# Patient Record
Sex: Female | Born: 1955 | Race: White | Hispanic: No
Health system: Southern US, Academic
[De-identification: ages and names within clinical notes are randomized; demographics above are authoritative.]

## PROBLEM LIST (undated history)

## (undated) ENCOUNTER — Ambulatory Visit

## (undated) ENCOUNTER — Encounter

## (undated) ENCOUNTER — Encounter: Attending: Internal Medicine | Primary: Internal Medicine

## (undated) ENCOUNTER — Ambulatory Visit: Payer: PRIVATE HEALTH INSURANCE

## (undated) DIAGNOSIS — E039 Hypothyroidism, unspecified: Secondary | ICD-10-CM

## (undated) DIAGNOSIS — N182 Chronic kidney disease, stage 2 (mild): Secondary | ICD-10-CM

## (undated) DIAGNOSIS — E119 Type 2 diabetes mellitus without complications: Secondary | ICD-10-CM

## (undated) DIAGNOSIS — I10 Essential (primary) hypertension: Secondary | ICD-10-CM

## (undated) DIAGNOSIS — K219 Gastro-esophageal reflux disease without esophagitis: Secondary | ICD-10-CM

## (undated) DIAGNOSIS — M069 Rheumatoid arthritis, unspecified: Secondary | ICD-10-CM

## (undated) DIAGNOSIS — E785 Hyperlipidemia, unspecified: Secondary | ICD-10-CM

## (undated) DIAGNOSIS — Z8639 Personal history of other endocrine, nutritional and metabolic disease: Secondary | ICD-10-CM

## (undated) DIAGNOSIS — G629 Polyneuropathy, unspecified: Secondary | ICD-10-CM

## (undated) DIAGNOSIS — F32A Depression, unspecified: Secondary | ICD-10-CM

## (undated) DIAGNOSIS — E559 Vitamin D deficiency, unspecified: Secondary | ICD-10-CM

## (undated) DIAGNOSIS — E538 Deficiency of other specified B group vitamins: Secondary | ICD-10-CM

## (undated) DIAGNOSIS — J449 Chronic obstructive pulmonary disease, unspecified: Secondary | ICD-10-CM

## (undated) DIAGNOSIS — F329 Major depressive disorder, single episode, unspecified: Secondary | ICD-10-CM

## (undated) HISTORY — DX: Polyneuropathy, unspecified: G62.9

## (undated) HISTORY — DX: Vitamin D deficiency, unspecified: E55.9

## (undated) HISTORY — DX: Hyperlipidemia, unspecified: E78.5

## (undated) HISTORY — DX: Chronic obstructive pulmonary disease, unspecified: J44.9

## (undated) HISTORY — DX: Type 2 diabetes mellitus without complications: E11.9

## (undated) HISTORY — PX: CATARACT EXTRACTION: SUR2

## (undated) HISTORY — DX: Deficiency of other specified B group vitamins: E53.8

## (undated) HISTORY — DX: Depression, unspecified: F32.A

## (undated) HISTORY — DX: Chronic kidney disease, stage 2 (mild): N18.2

## (undated) HISTORY — DX: Rheumatoid arthritis, unspecified: M06.9

## (undated) HISTORY — DX: Hypothyroidism, unspecified: E03.9

## (undated) HISTORY — DX: Gastro-esophageal reflux disease without esophagitis: K21.9

## (undated) HISTORY — DX: Personal history of other endocrine, nutritional and metabolic disease: Z86.39

## (undated) HISTORY — DX: Essential (primary) hypertension: I10

## (undated) HISTORY — DX: Major depressive disorder, single episode, unspecified: F32.9

---

## 1998-08-25 ENCOUNTER — Other Ambulatory Visit: Admission: RE | Admit: 1998-08-25 | Discharge: 1998-08-25 | Payer: Self-pay

## 1998-09-24 ENCOUNTER — Ambulatory Visit (HOSPITAL_BASED_OUTPATIENT_CLINIC_OR_DEPARTMENT_OTHER): Admission: RE | Admit: 1998-09-24 | Discharge: 1998-09-24 | Payer: Self-pay | Admitting: Orthopedic Surgery

## 2001-02-06 ENCOUNTER — Other Ambulatory Visit: Admission: RE | Admit: 2001-02-06 | Discharge: 2001-02-06 | Payer: Self-pay

## 2001-11-05 ENCOUNTER — Encounter: Payer: Self-pay | Admitting: Family Medicine

## 2001-11-05 ENCOUNTER — Ambulatory Visit (HOSPITAL_COMMUNITY): Admission: RE | Admit: 2001-11-05 | Discharge: 2001-11-05 | Payer: Self-pay | Admitting: Family Medicine

## 2002-12-03 ENCOUNTER — Ambulatory Visit (HOSPITAL_COMMUNITY): Admission: RE | Admit: 2002-12-03 | Discharge: 2002-12-03 | Payer: Self-pay | Admitting: Family Medicine

## 2005-02-18 ENCOUNTER — Ambulatory Visit: Payer: Self-pay | Admitting: Family Medicine

## 2005-08-23 ENCOUNTER — Other Ambulatory Visit: Admission: RE | Admit: 2005-08-23 | Discharge: 2005-08-23 | Payer: Self-pay | Admitting: Family Medicine

## 2006-12-12 ENCOUNTER — Other Ambulatory Visit: Admission: RE | Admit: 2006-12-12 | Discharge: 2006-12-12 | Payer: Self-pay | Admitting: Family Medicine

## 2008-06-25 ENCOUNTER — Ambulatory Visit (HOSPITAL_COMMUNITY): Admission: RE | Admit: 2008-06-25 | Discharge: 2008-06-25 | Payer: Self-pay | Admitting: Internal Medicine

## 2008-08-14 ENCOUNTER — Ambulatory Visit (HOSPITAL_COMMUNITY): Admission: RE | Admit: 2008-08-14 | Discharge: 2008-08-14 | Payer: Self-pay | Admitting: Family Medicine

## 2009-09-22 ENCOUNTER — Ambulatory Visit (HOSPITAL_COMMUNITY): Admission: RE | Admit: 2009-09-22 | Discharge: 2009-09-22 | Payer: Self-pay | Admitting: Internal Medicine

## 2009-09-22 IMAGING — MG MM DIGITAL SCREENING
5 series · 5 of 5 positions shown · non-contrast
Comparison: Prior studies.

DG SCREEN MAMMOGRAM BILATERAL
Bilateral CC and MLO view(s) were taken.

DIGITAL SCREENING MAMMOGRAM WITH CAD:

[L CC (1 of 2)]
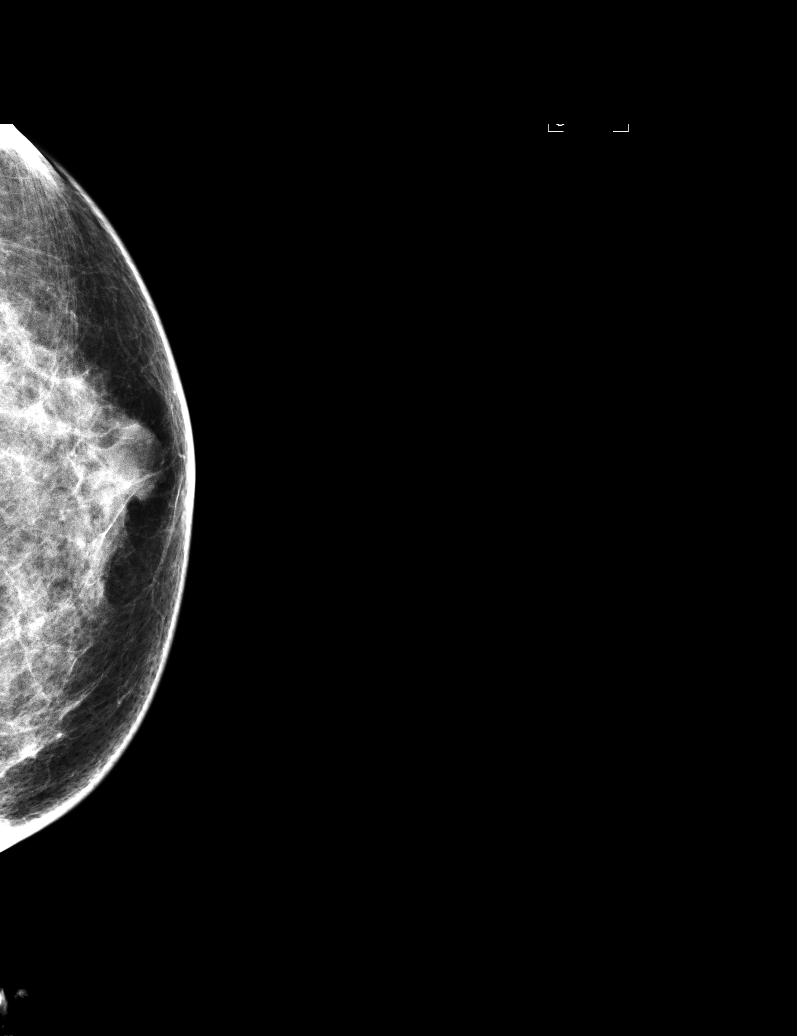

[L MLO]
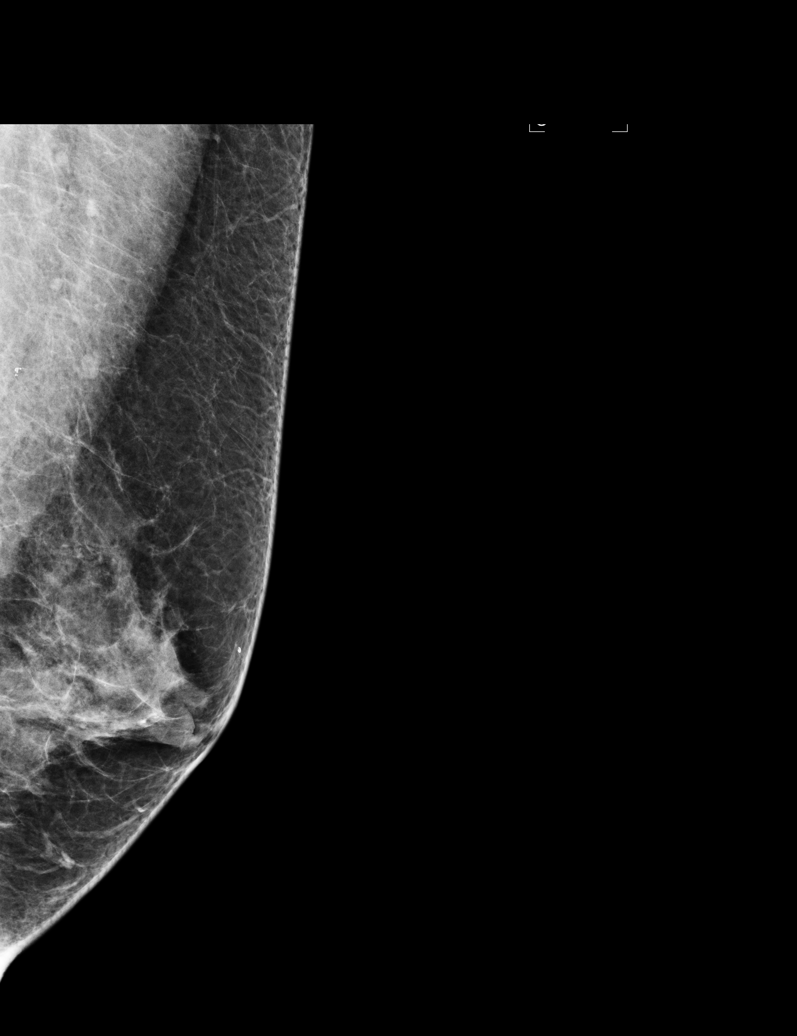

[R CC]
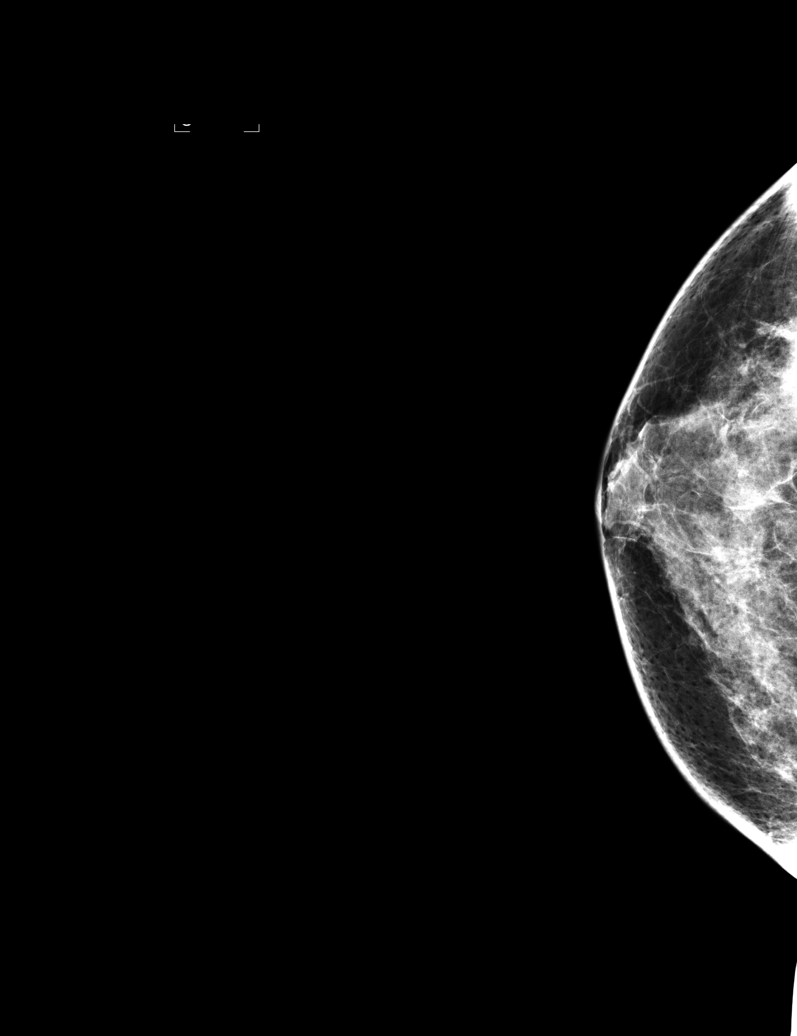

[R MLO]
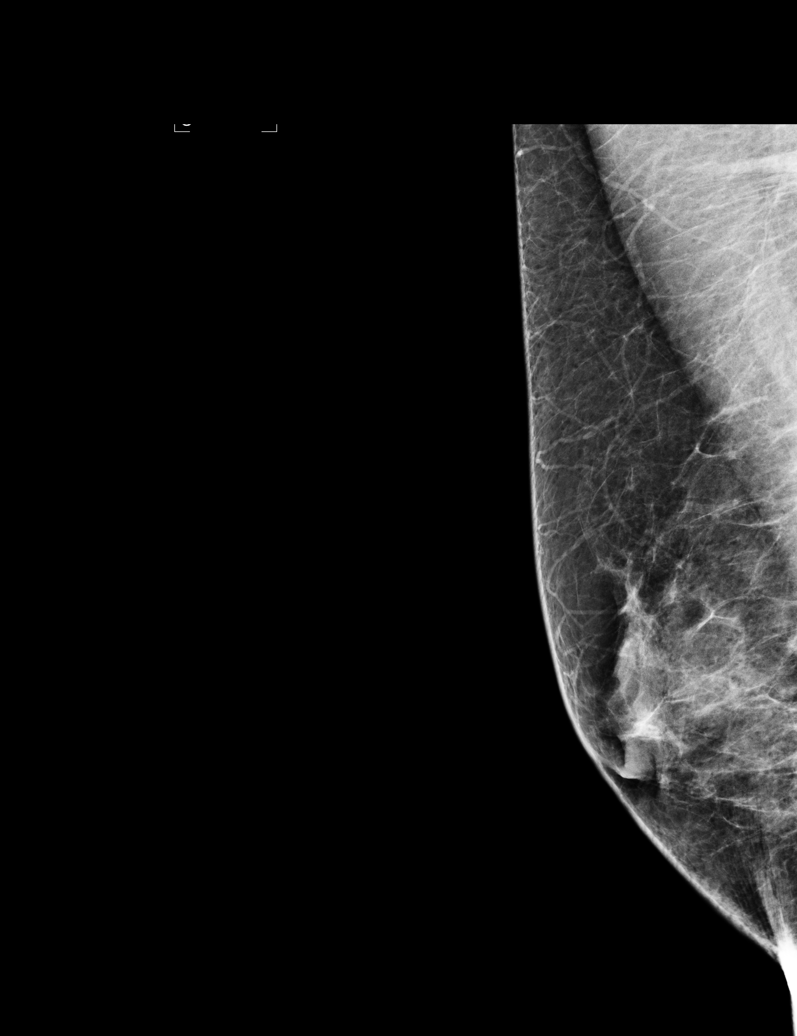

[L CC (2 of 2)]
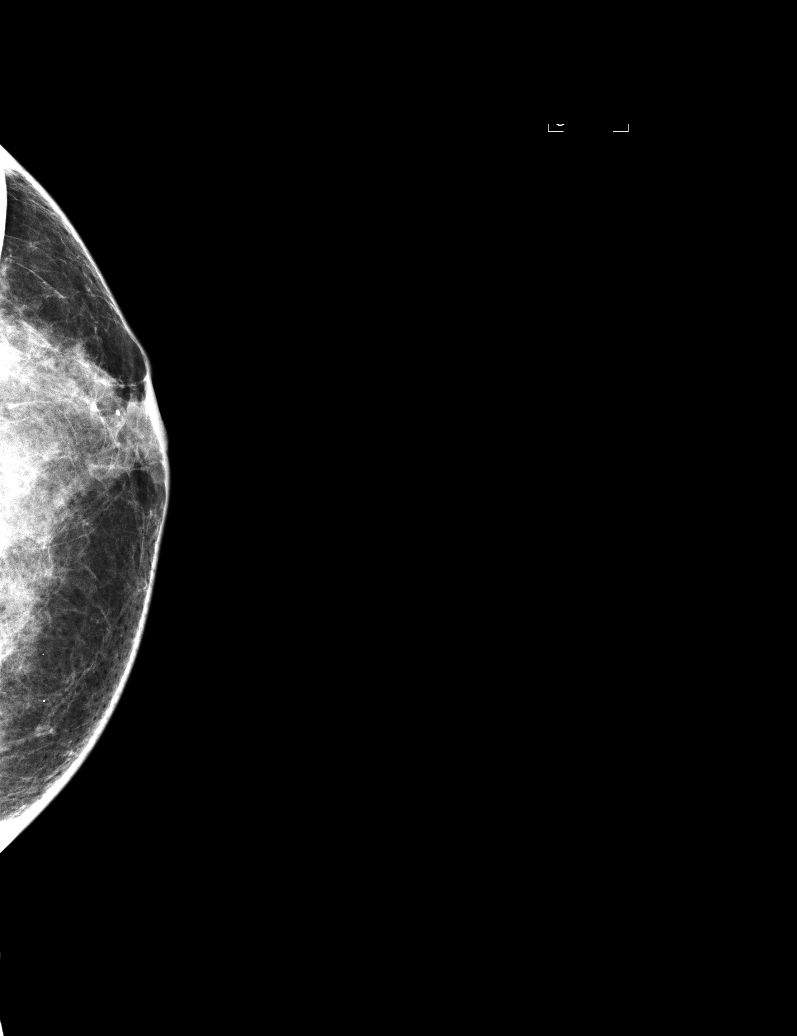

[5 of 5 positions shown; findings below may reference images not displayed]

The breast tissue is heterogeneously dense.  There is no dominant mass, architectural distortion or
calcification to suggest malignancy.

Images were processed with CAD.
IMPRESSION: No mammographic evidence of malignancy.  Suggest yearly screening mammography.

A result letter of this screening mammogram will be mailed directly to the patient.

ASSESSMENT: Negative - BI-RADS 1

Screening mammogram in 1 year.
,

## 2010-09-26 HISTORY — PX: COLONOSCOPY: SHX174

## 2010-10-17 ENCOUNTER — Encounter: Payer: Self-pay | Admitting: Family Medicine

## 2015-07-27 ENCOUNTER — Encounter (HOSPITAL_COMMUNITY): Payer: Self-pay | Admitting: *Deleted

## 2015-07-27 ENCOUNTER — Encounter (HOSPITAL_COMMUNITY)
Admission: RE | Disposition: A | Discharge status: Home / Self Care | Payer: Self-pay | Source: Ambulatory Visit | Attending: Ophthalmology

## 2015-07-27 ENCOUNTER — Ambulatory Visit (HOSPITAL_COMMUNITY)
Admission: RE | Admit: 2015-07-27 | Discharge: 2015-07-27 | Disposition: A | Discharge status: Home / Self Care | Payer: PRIVATE HEALTH INSURANCE | Source: Ambulatory Visit | Attending: Ophthalmology | Admitting: Ophthalmology

## 2015-07-27 DIAGNOSIS — H26492 Other secondary cataract, left eye: Secondary | ICD-10-CM | POA: Insufficient documentation

## 2015-07-27 HISTORY — PX: YAG LASER APPLICATION: SHX6189

## 2015-07-27 SURGERY — TREATMENT, USING YAG LASER
Anesthesia: LOCAL | Laterality: Left

## 2015-07-27 MED ORDER — TROPICAMIDE 1 % OP SOLN
OPHTHALMIC | Status: AC
  Filled 2015-07-27: qty 3

## 2015-07-27 MED ORDER — TETRACAINE HCL 0.5 % OP SOLN
1.0000 [drp] | Freq: Once | OPHTHALMIC | Status: AC
  Administered 2015-07-27: 1 [drp] via OPHTHALMIC

## 2015-07-27 MED ORDER — TROPICAMIDE 1 % OP SOLN
1.0000 [drp] | OPHTHALMIC | Status: AC
  Administered 2015-07-27 (×3): 1 [drp] via OPHTHALMIC

## 2015-07-27 MED ORDER — TETRACAINE HCL 0.5 % OP SOLN
OPHTHALMIC | Status: AC
  Filled 2015-07-27: qty 2

## 2015-07-27 NOTE — Brief Op Note (Signed)
CURRY DULSKI 07/27/2015  Williams Che, MD  Pre-op Diagnosis:  secondary cataract left eye  Post-op Diagnosis: same  Yag laser self-test completed: Yes.    Indications:  See office H&P for specific indications  Procedure:   YAG posterior capsulotomy OS  Eye protection worn by staff:  Yes.   Laser In Use sign on door:  Yes.    Laser:   {LUMENIS SELECTA DUET YAG/SLT LASER  Power Setting:  1.7 mJ/burst Anatomical site treated:  Posterior capsule OS Number of applications:  32 Total energy delivered: 53.70 mJ Results:  Visual axis cleared.  Patient was instructed to go to the office, as previously scheduled, for intraocular pressure:  No.  Patient verbalizes understanding of discharge instructions:  Yes.    Notes:   Pt tolerated procedure well.  There were no complications.

## 2015-07-27 NOTE — Discharge Instructions (Signed)
RILLA BUCKMAN  07/27/2015     Instructions    Activity: No Restrictions.   Diet: Resume Diet you were on at home.   Pain Medication: Tylenol if Needed.   CONTACT YOUR DOCTOR IF YOU HAVE PAIN, REDNESS IN YOUR EYE, OR DECREASED VISION.   Follow-up: 08/18/2015 at 1:30  with Williams Che, MD.     Dr. Iona Hansen: [phone]     If you find that you cannot contact your physician, but feel that your signs and   Symptoms warrant a physician's attention, call the Emergency Room at   [phone] ext.[phone].

## 2015-07-27 NOTE — H&P (Signed)
I have reviewed the pre printed H&P, the patient was re-examined, and I have identified no significant interval changes in the patient's medical condition.  There is no change in the plan of care since the history and physical of record. 

## 2017-04-10 MED FILL — XELJANZ XR/11MG/TB24: XELJANZ XR/11MG/TB24 | 30 days supply | Qty: 30 | Fill #2

## 2017-05-08 NOTE — Unmapped (Signed)
Specialty Pharmacy Refill Coordination Note     Kristin Mccormick is a 61 y.o. female contacted today regarding refills of her specialty medication(s).    Reviewed and verified with patient:      Specialty medication(s) and dose(s) confirmed: yes  Changes to medications: no  Changes to insurance: no    Medication Adherence    Patient reported X missed doses in the last month:  0  Specialty Medication:  [name] 11mg   Informant:  patient  Reliability of informant:  reliable  Provider-estimated medication adherence level:  90-100%  Confirmed plan for next specialty medication refill:  delivery by pharmacy  Medication Assistance Program  Refill Coordination  Has the Patient's Contact Information Changed:  No  Is the Shipping Address Different:  No  Shipping Information  Delivery Scheduled:  Yes  Delivery Date:  05/10/17  Medications to be Shipped:  [name] 11mg           Follow-up: 1 month(s)     [employer]  Specialty Pharmacy Technician

## 2017-05-09 MED FILL — XELJANZ XR/11MG/TB24: XELJANZ XR/11MG/TB24 | 30 days supply | Qty: 30 | Fill #3

## 2017-06-06 MED ORDER — TOFACITINIB ER 11 MG TABLET,EXTENDED RELEASE 24 HR
ORAL_TABLET | ORAL | 2 refills | 0 days
Start: 2017-06-06 — End: 2018-06-06

## 2017-06-06 NOTE — Unmapped (Signed)
[facility] Specialty Pharmacy Refill Coordination Note  Specialty Medication(s): [name]  Additional Medications shipped:     Kristin Mccormick, DOB: 11/15/1955  Phone: [phone] (home) , Alternate phone contact: N/A  Phone or address changes today?: No  All above HIPAA information was verified with patient.  Shipping Address: [phone]  [place] [address]   Insurance changes? No    Completed refill call assessment today to schedule patient's medication shipment from the [facility] [phone]).      Confirmed the medication and dosage are correct and have not changed: Yes, regimen is correct and unchanged.    Confirmed patient started or stopped the following medications in the past month:  No, there are no changes reported at this time.    Are you tolerating your medication?:  Kristin Mccormick reports tolerating the medication.    ADHERENCE    (Below is required for Medicare Part B or Transplant patients only - per drug):   How many tablets were dispensed last month:   Patient currently has  remaining.    Did you miss any doses in the past 4 weeks? No missed doses reported.    FINANCIAL/SHIPPING    Delivery Scheduled: Yes, Expected medication delivery date: 9/17     Kristin Mccormick did not have any additional questions at this time.    Delivery address validated in FSI scheduling system: Yes, address listed in FSI is correct.    We will follow up with patient monthly for standard refill processing and delivery.      Thank you,  Darlin Coco   [facility] Specialty Technician

## 2017-06-08 MED ORDER — TOFACITINIB ER 11 MG TABLET,EXTENDED RELEASE 24 HR
ORAL_TABLET | ORAL | 2 refills | 0 days
Start: 2017-06-08 — End: 2018-06-08

## 2017-06-12 MED FILL — XELJANZ XR/11MG/TB24: XELJANZ XR/11MG/TB24 | 30 days supply | Qty: 30 | Fill #0

## 2017-07-05 NOTE — Unmapped (Signed)
PT SAYS SHE IS WAITING BECAUSE DOC WANTS TO CHANGE THE DOSE - SHE SAID TO CALL HER WHEN WE GET THE SCRIPT

## 2017-07-12 NOTE — Unmapped (Signed)
[facility] Specialty Pharmacy Refill Coordination Note  Specialty Medication(s): Kristin Mccormick  Additional Medications shipped:     Kristin Mccormick, DOB: 1956/05/12  Phone: [phone] (home) , Alternate phone contact: N/A  Phone or address changes today?: No  All above HIPAA information was verified with patient.  Shipping Address: [phone]  [place] [address]   Insurance changes? No    Completed refill call assessment today to schedule patient's medication shipment from the [facility] [phone]).      Confirmed the medication and dosage are correct and have not changed: Yes, regimen is correct and unchanged.    Confirmed patient started or stopped the following medications in the past month:  No, there are no changes reported at this time.    Are you tolerating your medication?:  Kristin Mccormick reports tolerating the medication.    ADHERENCE      Did you miss any doses in the past 4 weeks? No missed doses reported.    FINANCIAL/SHIPPING    Delivery Scheduled: Yes, Expected medication delivery date: 07/14/17     Kristin Mccormick did not have any additional questions at this time.    Delivery address validated in FSI scheduling system: Yes, address listed in FSI is correct.    We will follow up with patient monthly for standard refill processing and delivery.      Thank you,  Kristin Mccormick   [facility] Specialty Pharmacist

## 2017-07-13 MED FILL — XELJANZ XR/11MG/TB24: XELJANZ XR/11MG/TB24 | 30 days supply | Qty: 30 | Fill #1

## 2017-09-07 MED FILL — XELJANZ XR/11MG/TB24: XELJANZ XR/11MG/TB24 | 30 days supply | Qty: 30 | Fill #2

## 2017-10-11 MED ORDER — TOFACITINIB ER 11 MG TABLET,EXTENDED RELEASE 24 HR
ORAL_TABLET | ORAL | 1 refills | 0 days
Start: 2017-10-11 — End: 2018-10-11

## 2017-10-11 MED FILL — XELJANZ XR/11MG/TB24: XELJANZ XR/11MG/TB24 | 30 days supply | Qty: 30 | Fill #0

## 2017-10-11 NOTE — Unmapped (Signed)
[facility] Specialty Pharmacy Refill Coordination Note  Specialty Medication(s): [name] 11mg     Kristin Mccormick, DOB: [birth year]  Phone: [phone] (home) , Alternate phone contact: N/A  Phone or address changes today?: No  All above HIPAA information was verified with patient.  Shipping Address: [phone]  [place] [address]   Insurance changes? No    Completed refill call assessment today to schedule patient's medication shipment from the [facility] [phone]).      Confirmed the medication and dosage are correct and have not changed: Yes, regimen is correct and unchanged.    Confirmed patient started or stopped the following medications in the past month:  No, there are no changes reported at this time.    Are you tolerating your medication?:  Kristin Mccormick reports tolerating the medication.    ADHERENCE    (Below is required for Medicare Part B or Transplant patients only - per drug):   How many tablets were dispensed last month: 30    Patient currently has 1 remaining.    Did you miss any doses in the past 4 weeks? No missed doses reported.    FINANCIAL/SHIPPING    Delivery Scheduled: Yes, Expected medication delivery date: 10/12/2017     [name] did not have any additional questions at this time.    Delivery address validated in FSI scheduling system: Yes, address listed in FSI is correct.    We will follow up with patient monthly for standard refill processing and delivery.      Thank you,  Tamala Fothergill   [facility] Shared [facility] Specialty Technician

## 2017-11-02 NOTE — Unmapped (Signed)
[facility] Specialty Pharmacy Refill and Clinical Coordination Note  Medication(s): Kristin Mccormick, DOB: October 16, 1955  Phone: [phone] (home) , Alternate phone contact: N/A  Shipping address: [address]  [place] [address]  Phone or address changes today?: No  All above HIPAA information verified.  Insurance changes? No    Completed refill and clinical call assessment today to schedule patient's medication shipment from the [facility] [phone]).      MEDICATION RECONCILIATION    Confirmed the medication and dosage are correct and have not changed: Yes, regimen is correct and unchanged.    Were there any changes to your medication(s) in the past month:  No, there are no changes reported at this time.    ADHERENCE    Is this medicine transplant or covered by Medicare Part B? No.        Did you miss any doses in the past 4 weeks? No missed doses reported.  Adherence counseling provided? Not needed     SIDE EFFECT MANAGEMENT    Are you tolerating your medication?:  Kristin Mccormick reports tolerating the medication.  Side effect management discussed: None      Therapy is appropriate and should be continued.          FINANCIAL/SHIPPING    Delivery Scheduled: Yes, Expected medication delivery date: 11/07/17   Additional medications refilled: No additional medications/refills needed at this time.    Kristin Mccormick did not have any additional questions at this time.    Delivery address validated in FSI scheduling system: Yes, address listed above is correct.      We will follow up with patient monthly for standard refill processing and delivery.      Thank you,  Rollen Sox   [facility] Shared [facility] Specialty Pharmacist

## 2017-11-07 MED FILL — XELJANZ XR/11MG/TB24: XELJANZ XR/11MG/TB24 | 30 days supply | Qty: 30 | Fill #1

## 2017-12-04 NOTE — Unmapped (Signed)
[facility] Specialty Pharmacy Refill Coordination Note  Specialty Medication(s): [name] 11mg       Vassie Loll, DOB: 1955-10-25  Phone: [phone] (home) , Alternate phone contact: N/A  Phone or address changes today?: No  All above HIPAA information was verified with patient.  Shipping Address: [phone]  [place] [address]   Insurance changes? No    Completed refill call assessment today to schedule patient's medication shipment from the [facility] [phone]).      Confirmed the medication and dosage are correct and have not changed: Yes, regimen is correct and unchanged.    Confirmed patient started or stopped the following medications in the past month:  Ms. Nygard reports discontinuing Methotrexate, Folic Acid and Sulcrafate  She also reports startng Leflunomide 20mg .    Are you tolerating your medication?:  Schae reports tolerating the medication.    ADHERENCE  Did you miss any doses in the past 4 weeks? No missed doses reported.    FINANCIAL/SHIPPING    Delivery Scheduled: Yes, Expected medication delivery date: 12/06/2017     The patient will receive an FSI print out for each medication shipped and additional FDA Medication Guides as required.  Patient education from [place] or Robet Leu may also be included in the shipment    Jose did not have any additional questions at this time.    Delivery address validated in FSI scheduling system: Yes, address listed in FSI is correct.    We will follow up with patient monthly for standard refill processing and delivery.      Thank you,  Argenis Kumari  Anders Grant   [facility] Specialty Pharmacist

## 2017-12-05 MED FILL — XELJANZ XR/11MG/TB24: XELJANZ XR/11MG/TB24 | 30 days supply | Qty: 30 | Fill #2

## 2018-01-02 NOTE — Unmapped (Signed)
[facility] Specialty Pharmacy Refill Coordination Note  Specialty Medication(s): Kristin Mccormick, DOB: [birth year]  Phone: [phone] (home) , Alternate phone contact: N/A  Phone or address changes today?: No  All above HIPAA information was verified with patient.  Shipping Address: [phone]  [place] [address]   Insurance changes? No    Completed refill call assessment today to schedule patient's medication shipment from the [facility] [phone]).      Confirmed the medication and dosage are correct and have not changed: Yes, regimen is correct and unchanged.    Confirmed patient started or stopped the following medications in the past month:  No, there are no changes reported at this time.    Are you tolerating your medication?:  Kristin Mccormick reports tolerating the medication.    ADHERENCE    (Below is required for Medicare Part B or Transplant patients only - per drug):   How many tablets were dispensed last month: 30  Patient currently has  6 OR 7 remaining.    Did you miss any doses in the past 4 weeks? No missed doses reported.    FINANCIAL/SHIPPING    Delivery Scheduled: Yes, Expected medication delivery date: 01/05/18     The patient will receive an FSI print out for each medication shipped and additional FDA Medication Guides as required.  Patient education from [place] or Robet Leu may also be included in the shipment    Kristin Mccormick did not have any additional questions at this time.    Delivery address validated in FSI scheduling system: Yes, address listed in FSI is correct.    We will follow up with patient monthly for standard refill processing and delivery.      Thank you,  Westley Gambles   [facility] Shared [facility] Specialty Technician

## 2018-01-04 MED FILL — XELJANZ XR/11MG/TB24: XELJANZ XR/11MG/TB24 | 30 days supply | Qty: 30 | Fill #0

## 2018-02-05 NOTE — Unmapped (Signed)
[facility] Specialty Pharmacy Refill Coordination Note  Specialty Medication(s): [name] 11mg   Additional Medications shipped: none    Kristin Mccormick, DOB: September 08, 1956  Phone: [phone] (home) , Alternate phone contact: N/A  Phone or address changes today?: No  All above HIPAA information was verified with patient.  Shipping Address: [phone]  [place] [address]   Insurance changes? No    Completed refill call assessment today to schedule patient's medication shipment from the [facility] [phone]).      Confirmed the medication and dosage are correct and have not changed: Yes, regimen is correct and unchanged.    Confirmed patient started or stopped the following medications in the past month:  No, there are no changes reported at this time.    Are you tolerating your medication?:  Kristin Mccormick reports tolerating the medication.    ADHERENCE    Did you miss any doses in the past 4 weeks? No missed doses reported.    FINANCIAL/SHIPPING    Delivery Scheduled: Yes, Expected medication delivery date: 02/07/18     The patient will receive an FSI print out for each medication shipped and additional FDA Medication Guides as required.  Patient education from [place] or Robet Leu may also be included in the shipment    Kristin Mccormick did not have any additional questions at this time.    Delivery address validated in FSI scheduling system: Yes, address listed in FSI is correct.    We will follow up with patient monthly for standard refill processing and delivery.      Thank you,  Lupita Shutter   [facility] Specialty Pharmacist

## 2018-02-07 NOTE — Unmapped (Signed)
PA denied for Kristin Mccormick, awaiting appeal.

## 2018-02-20 MED FILL — XELJANZ XR/11MG/TB24: XELJANZ XR/11MG/TB24 | 30 days supply | Qty: 30 | Fill #1

## 2018-03-27 MED ORDER — TOFACITINIB ER 11 MG TABLET,EXTENDED RELEASE 24 HR
ORAL_TABLET | ORAL | 0 refills | 0 days
Start: 2018-03-27 — End: 2019-03-27

## 2018-03-27 MED FILL — XELJANZ XR/11MG/TB24: XELJANZ XR/11MG/TB24 | 30 days supply | Qty: 30 | Fill #0

## 2018-04-24 MED ORDER — TOFACITINIB ER 11 MG TABLET,EXTENDED RELEASE 24 HR
ORAL_TABLET | ORAL | 2 refills | 0 days
Start: 2018-04-24 — End: 2018-07-27

## 2018-04-26 MED FILL — XELJANZ XR/11MG/TB24: XELJANZ XR/11MG/TB24 | 30 days supply | Qty: 30 | Fill #0

## 2018-05-25 MED FILL — XELJANZ XR 11 MG TABLET,EXTENDED RELEASE: 30 days supply | Qty: 30 | Fill #0 | Status: AC

## 2018-05-25 MED FILL — XELJANZ XR 11 MG TABLET,EXTENDED RELEASE: ORAL | 30 days supply | Qty: 30 | Fill #0

## 2018-06-25 MED FILL — XELJANZ XR 11 MG TABLET,EXTENDED RELEASE: ORAL | 30 days supply | Qty: 30 | Fill #1

## 2018-06-25 MED FILL — XELJANZ XR 11 MG TABLET,EXTENDED RELEASE: 30 days supply | Qty: 30 | Fill #1 | Status: AC

## 2018-07-27 MED ORDER — TOFACITINIB ER 11 MG TABLET,EXTENDED RELEASE 24 HR
ORAL_TABLET | Freq: Every day | ORAL | 2 refills | 0.00000 days
Start: 2018-07-27 — End: 2018-10-30

## 2018-07-31 MED FILL — XELJANZ XR 11 MG TABLET,EXTENDED RELEASE: ORAL | 30 days supply | Qty: 30 | Fill #0

## 2018-07-31 MED FILL — XELJANZ XR 11 MG TABLET,EXTENDED RELEASE: 30 days supply | Qty: 30 | Fill #0 | Status: AC

## 2018-08-30 MED FILL — XELJANZ XR 11 MG TABLET,EXTENDED RELEASE: 30 days supply | Qty: 30 | Fill #1 | Status: AC

## 2018-08-30 MED FILL — XELJANZ XR 11 MG TABLET,EXTENDED RELEASE: ORAL | 30 days supply | Qty: 30 | Fill #1

## 2018-10-01 MED FILL — XELJANZ XR 11 MG TABLET,EXTENDED RELEASE: 30 days supply | Qty: 30 | Fill #2 | Status: AC

## 2018-10-01 MED FILL — XELJANZ XR 11 MG TABLET,EXTENDED RELEASE: ORAL | 30 days supply | Qty: 30 | Fill #2

## 2018-10-30 MED ORDER — XELJANZ XR 11 MG TABLET,EXTENDED RELEASE
ORAL_TABLET | 1 refills | 0 days
Start: 2018-10-30 — End: 2019-01-01

## 2018-11-01 MED FILL — XELJANZ XR 11 MG TABLET,EXTENDED RELEASE: 30 days supply | Qty: 30 | Fill #0 | Status: AC

## 2018-11-01 MED FILL — XELJANZ XR 11 MG TABLET,EXTENDED RELEASE: 30 days supply | Qty: 30 | Fill #0

## 2018-11-26 ENCOUNTER — Encounter: Payer: Self-pay | Admitting: Gastroenterology

## 2018-11-28 MED FILL — XELJANZ XR 11 MG TABLET,EXTENDED RELEASE: 30 days supply | Qty: 30 | Fill #1

## 2018-11-28 MED FILL — XELJANZ XR 11 MG TABLET,EXTENDED RELEASE: 30 days supply | Qty: 30 | Fill #1 | Status: AC

## 2019-01-01 MED ORDER — XELJANZ XR 11 MG TABLET,EXTENDED RELEASE
ORAL_TABLET | 0 refills | 0 days
Start: 2019-01-01 — End: 2019-02-06

## 2019-01-09 MED FILL — XELJANZ XR 11 MG TABLET,EXTENDED RELEASE: 30 days supply | Qty: 30 | Fill #0 | Status: AC

## 2019-01-09 MED FILL — XELJANZ XR 11 MG TABLET,EXTENDED RELEASE: 30 days supply | Qty: 30 | Fill #0

## 2019-01-29 ENCOUNTER — Ambulatory Visit (INDEPENDENT_AMBULATORY_CARE_PROVIDER_SITE_OTHER): Payer: Commercial Managed Care - PPO | Admitting: Gastroenterology

## 2019-01-29 ENCOUNTER — Other Ambulatory Visit: Payer: Self-pay

## 2019-01-29 ENCOUNTER — Encounter: Payer: Self-pay | Admitting: Gastroenterology

## 2019-01-29 DIAGNOSIS — K529 Noninfective gastroenteritis and colitis, unspecified: Secondary | ICD-10-CM | POA: Insufficient documentation

## 2019-01-29 NOTE — Progress Notes (Signed)
CC'D TO PCP °

## 2019-01-29 NOTE — Progress Notes (Signed)
Primary Care Physician:  Glenda Chroman, MD Primary GI:  Garfield Cornea, MD    Patient Location: Home  Provider Location: [facility] office  Reason for Visit: diarrhea  Persons present on the virtual encounter, with roles: Patient, myself (provider),Mindy Quincy Simmonds CMA (updated meds and allergies)  Total time (minutes) spent on medical discussion: 25 minutes  Due to COVID-19, visit was conducted using Doxy.me method.  Visit was requested by patient.  Virtual Visit via Doxy.me  I connected with Erica Beasley on 01/29/19 at  8:00 AM EDT by [email or web address] and verified that I am speaking with the correct person using two identifiers.   I discussed the limitations, risks, security and privacy concerns of performing an evaluation and management service by telephone/video and the availability of in person appointments. I also discussed with the patient that there may be a patient responsible charge related to this service. The patient expressed understanding and agreed to proceed.  Chief Complaint  Patient presents with  . Diarrhea    x December, 10-15 times a day, not watery, no blood in stool, no abd pain, no nausea, no vomiting    HPI:   Erica Beasley is a 63 y.o. female who presents for virtual visit regarding chronic diarrhea at the request of Nicanor Bake, FNP.   Patient states back in October she had some change in bowel habits.  She went from having her normal 1 bowel movement a day to having several bowel movements a day.  She notes that however she felt like it was related to metformin.  She had her medications adjusted and came off of metformin.  She did okay until September 05, 2018.  She ate fish with tartar sauce and became ill with vomiting and diarrhea.  It was shortly after that that she was diagnosed with C. difficile.  She reports initially she was given a course of Flagyl.  She had no improvement in her symptoms.  Subsequently has had 2 rounds of vancomycin for persisting  diarrhea.  Really felt like no improvement with vancomycin either.  She denies any repeat stool testing.  She reports fecal urgency with incontinence if she cannot get there on time.  Stool appears greasy/oily.  She has a lot of gas.  Having up to 10 stools a day.  Denies nocturnal bowel movements.  No abdominal pain, melena, rectal bleeding.  No weight loss.  Appetite somewhat poor but she does eat.  She consumes yogurt every day.  No problems with heartburn, dysphagia.  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  Reports the only new medication she is on is Lyrica.  Received a fax from her rheumatologist requesting [place] to get a CBC, CMP when we sent her to the lab for ourselves.  TCS 2012, Dr. Posey Pronto, couple of polyps maybe.   Menopause age 68   Current Outpatient Medications  Medication Sig Dispense Refill  . citalopram (CELEXA) 20 MG tablet Take 20 mg by mouth daily.    . Fluticasone Furoate-Vilanterol (BREO ELLIPTA IN) Inhale into the lungs as needed.    Marland Kitchen glipiZIDE (GLUCOTROL) 10 MG tablet Take 10 mg by mouth 2 (two) times daily before a meal.    . hydroxychloroquine (PLAQUENIL) 200 MG tablet Take 200 mg by mouth daily.    Marland Kitchen leflunomide (ARAVA) 20 MG tablet Take 20 mg by mouth daily.    Marland Kitchen levothyroxine (SYNTHROID, LEVOTHROID) 112 MCG tablet Take 112 mcg by mouth  daily before breakfast.    . potassium chloride (K-DUR) 10 MEQ tablet Take 10 mEq by mouth daily.    . pravastatin (PRAVACHOL) 40 MG tablet Take 40 mg by mouth daily.    . predniSONE (DELTASONE) 5 MG tablet Take 5 mg by mouth daily with breakfast.    . pregabalin (LYRICA) 75 MG capsule Take 75 mg by mouth daily.    . raloxifene (EVISTA) 60 MG tablet Take 60 mg by mouth daily.    . Tofacitinib Citrate ER (XELJANZ XR) 11 MG TB24 Take 1 tablet by mouth daily.     No current facility-administered medications for this visit.     Past Medical History:  Diagnosis Date  . COPD (chronic obstructive pulmonary  disease) ([place])   . Depression   . Diabetes ([place])   . Hyperlipidemia   . Hypothyroidism   . Neuropathy   . Rheumatoid arthritis [facility])     Past Surgical History:  Procedure Laterality Date  . YAG LASER APPLICATION Left 31/54/0086   Procedure: YAG LASER APPLICATION;  Surgeon: Williams Che, MD;  Location: [facility];  Service: Ophthalmology;  Laterality: Left;    Family History  Problem Relation Age of Onset  . Stroke Mother   . Heart disease Mother   . Diabetes Mother   . Other Father        stomach taken out for some reason  . Colon cancer Neg Hx     Social History   Socioeconomic History  . Marital status: Widowed    Spouse name: Not on file  . Number of children: Not on file  . Years of education: Not on file  . Highest education level: Not on file  Occupational History  . Not on file  Social Needs  . Financial resource strain: Not on file  . Food insecurity:    Worry: Not on file    Inability: Not on file  . Transportation needs:    Medical: Not on file    Non-medical: Not on file  Tobacco Use  . Smoking status: Former Smoker    Packs/day: 1.00    Years: 15.00    Pack years: 15.00    Types: Cigarettes  . Smokeless tobacco: Former Network engineer and Sexual Activity  . Alcohol use: Never    Frequency: Never  . Drug use: Never  . Sexual activity: Not on file  Lifestyle  . Physical activity:    Days per week: Not on file    Minutes per session: Not on file  . Stress: Not on file  Relationships  . Social connections:    Talks on phone: Not on file    Gets together: Not on file    Attends religious service: Not on file    Active member of club or organization: Not on file    Attends meetings of clubs or organizations: Not on file    Relationship status: Not on file  . Intimate partner violence:    Fear of current or ex partner: Not on file    Emotionally abused: Not on file    Physically abused: Not on file    Forced sexual activity: Not on file   Other Topics Concern  . Not on file  Social History Narrative  . Not on file      ROS:  General: Negative for anorexia, weight loss, fever, chills, fatigue, weakness. Eyes: Negative for vision changes.  ENT: Negative for hoarseness, difficulty swallowing , nasal congestion. CV: Negative  for chest pain, angina, palpitations, dyspnea on exertion, peripheral edema.  Respiratory: Negative for dyspnea at rest, dyspnea on exertion, cough, sputum, wheezing.  GI: See history of present illness. GU:  Negative for dysuria, hematuria, urinary incontinence, urinary frequency, nocturnal urination.  MS: Negative for joint pain, low back pain.  Derm: Negative for rash or itching.  Neuro: Negative for weakness, abnormal sensation, seizure, frequent headaches, memory loss, confusion.  Psych: Negative for anxiety, depression, suicidal ideation, hallucinations.  Endo: Negative for unusual weight change.  Heme: Negative for bruising or bleeding. Allergy: Negative for rash or hives.   Observations/Objective: Pleasant well-nourished well-developed Caucasian female in no acute distress.  Otherwise exam unavailable.  Assessment and Plan: Pleasant 63 year old female presenting for further evaluation of chronic diarrhea.  Symptoms began back in December.  She reports being diagnosed with C. difficile.  Initially given a course of Flagyl, subsequently 2 courses of vancomycin.  Patient has persistent diarrhea.  She is at increased risk for C. 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  Would be beneficial at this point to recheck her stools.  If her C. difficile is negative, would work towards colonoscopy to further evaluate her chronic diarrhea including rule out microscopic colitis.  We will go ahead and obtain labs per rheumatology request as well as for [place] to evaluate for other potential causes of chronic diarrhea such as celiac.  Follow Up Instructions:    I discussed the assessment and treatment plan with  the patient. The patient was provided an opportunity to ask questions and all were answered. The patient agreed with the plan and demonstrated an understanding of the instructions. AVS mailed to patient's home address.   The patient was advised to call back or seek an in-person evaluation if the symptoms worsen or if the condition fails to improve as anticipated.  I provided 25 minutes of virtual face-to-face time during this encounter.   Neil Crouch, PA-C

## 2019-01-29 NOTE — Patient Instructions (Addendum)
1. Please go to the [facility] in [place] to have your stool test and blood work done. [facility] actually does not have the stool test I need to get on you. Sorry for any inconvenience.  2. We will contact you with results as soon as they are available. 3. I will send a copy of your labs to Leafy Kindle, PA-C as well.

## 2019-02-04 LAB — TISSUE TRANSGLUTAMINASE, IGA: (tTG) Ab, IgA: 1 U/mL

## 2019-02-04 LAB — COMPREHENSIVE METABOLIC PANEL
AG Ratio: 1.2 (calc) (ref 1.0–2.5)
ALT: 11 U/L (ref 6–29)
AST: 15 U/L (ref 10–35)
Albumin: 3.6 g/dL (ref 3.6–5.1)
Alkaline phosphatase (APISO): 108 U/L (ref 37–153)
BUN: 8 mg/dL (ref 7–25)
CO2: 24 mmol/L (ref 20–32)
Calcium: 9 mg/dL (ref 8.6–10.4)
Chloride: 105 mmol/L (ref 98–110)
Creat: 0.64 mg/dL (ref 0.50–0.99)
Globulin: 3.1 g/dL (calc) (ref 1.9–3.7)
Glucose, Bld: 170 mg/dL — ABNORMAL HIGH (ref 65–99)
Potassium: 4 mmol/L (ref 3.5–5.3)
Sodium: 140 mmol/L (ref 135–146)
Total Bilirubin: 0.2 mg/dL (ref 0.2–1.2)
Total Protein: 6.7 g/dL (ref 6.1–8.1)

## 2019-02-04 LAB — CBC WITH DIFFERENTIAL/PLATELET
Absolute Monocytes: 567 cells/uL (ref 200–950)
Basophils Absolute: 0 cells/uL (ref 0–200)
Basophils Relative: 0 %
Eosinophils Absolute: 32 cells/uL (ref 15–500)
Eosinophils Relative: 0.5 %
HCT: 34.1 % — ABNORMAL LOW (ref 35.0–45.0)
Hemoglobin: 11 g/dL — ABNORMAL LOW (ref 11.7–15.5)
Lymphs Abs: 1777 cells/uL (ref 850–3900)
MCH: 27.8 pg (ref 27.0–33.0)
MCHC: 32.3 g/dL (ref 32.0–36.0)
MCV: 86.3 fL (ref 80.0–100.0)
MPV: 9 fL (ref 7.5–12.5)
Monocytes Relative: 9 %
Neutro Abs: 3925 cells/uL (ref 1500–7800)
Neutrophils Relative %: 62.3 %
Platelets: 372 10*3/uL (ref 140–400)
RBC: 3.95 10*6/uL (ref 3.80–5.10)
RDW: 14.3 % (ref 11.0–15.0)
Total Lymphocyte: 28.2 %
WBC: 6.3 10*3/uL (ref 3.8–10.8)

## 2019-02-04 LAB — TSH+FREE T4: TSH W/REFLEX TO FT4: 1.64 mIU/L (ref 0.40–4.50)

## 2019-02-04 LAB — IGA: Immunoglobulin A: 334 mg/dL — ABNORMAL HIGH (ref 70–320)

## 2019-02-06 ENCOUNTER — Telehealth: Payer: Self-pay | Admitting: Gastroenterology

## 2019-02-06 MED ORDER — XELJANZ XR 11 MG TABLET,EXTENDED RELEASE
ORAL_TABLET | Freq: Every day | ORAL | 0 refills | 0.00000 days
Start: 2019-02-06 — End: ?

## 2019-02-06 NOTE — Progress Notes (Signed)
Received PCP records, no stool studies available. Await current studies.

## 2019-02-06 NOTE — Telephone Encounter (Signed)
Please send copy of CBC, CMP (dated 02/01/19) to patient's rheumatologist Maylene Roes at [facility] [phone]).

## 2019-02-07 MED FILL — XELJANZ XR 11 MG TABLET,EXTENDED RELEASE: ORAL | 30 days supply | Qty: 30 | Fill #0

## 2019-02-07 MED FILL — XELJANZ XR 11 MG TABLET,EXTENDED RELEASE: 30 days supply | Qty: 30 | Fill #0 | Status: AC

## 2019-02-07 NOTE — Telephone Encounter (Signed)
Faxed 02/01/2019 labs to [phone]

## 2019-02-08 ENCOUNTER — Other Ambulatory Visit: Payer: Self-pay | Admitting: Gastroenterology

## 2019-02-08 LAB — C. DIFFICILE GDH AND TOXIN A/B
GDH ANTIGEN: DETECTED
MICRO NUMBER:: 471189
SPECIMEN QUALITY:: ADEQUATE
TOXIN A AND B: NOT DETECTED

## 2019-02-08 LAB — GASTROINTESTINAL PATHOGEN PANEL PCR
C. difficile Tox A/B, PCR: NOT DETECTED
Campylobacter, PCR: NOT DETECTED
Cryptosporidium, PCR: NOT DETECTED
E coli (ETEC) LT/ST PCR: NOT DETECTED
E coli (STEC) stx1/stx2, PCR: NOT DETECTED
E coli 0157, PCR: NOT DETECTED
Giardia lamblia, PCR: NOT DETECTED
Norovirus, PCR: NOT DETECTED
Rotavirus A, PCR: NOT DETECTED
Salmonella, PCR: NOT DETECTED
Shigella, PCR: NOT DETECTED

## 2019-02-08 LAB — CLOSTRIDIUM DIFFICILE TOXIN B, QUALITATIVE, REAL-TIME PCR: Toxigenic C. Difficile by PCR: DETECTED — AB

## 2019-02-08 MED ORDER — VANCOMYCIN HCL 125 MG PO CAPS: ORAL_CAPSULE | ORAL | 0 refills | Status: DC

## 2019-02-11 ENCOUNTER — Telehealth: Payer: Self-pay

## 2019-02-11 NOTE — Telephone Encounter (Signed)
[facility] called to ask if pts cdiff results were received and they have been. Documentation is in pts result note.

## 2019-02-12 NOTE — Progress Notes (Signed)
Noted  

## 2019-02-21 ENCOUNTER — Telehealth: Payer: Self-pay | Admitting: Internal Medicine

## 2019-02-21 NOTE — Telephone Encounter (Signed)
Please touch base with patient next week. Find out how many stools daily. And where in the vanc taper she is ie what dose.

## 2019-02-21 NOTE — Telephone Encounter (Signed)
Pt was calling to give an update on her condition. The vancomycin hasn't help with her diarrhea. [phone]

## 2019-02-21 NOTE — Telephone Encounter (Signed)
LSL, Spoke with pt. She continues to have diarrhea while taking Vancomycin s/p positive Cdiff testing. Pt is aware that antibiotics can cause diarrhea as well. Pt reports no watery diarrhea, no abdominal pain or cramping. Pt is on her 14 day of antibiotics and was advised to continue.

## 2019-02-25 NOTE — Telephone Encounter (Signed)
Spoke with pt. She is having about 10 loose stools a day. Pt states that her stools aren't watery and no pain is present. Pt is on the 2 pills daily which was started on Saturday 02/13/19 and will take 2 pills daily through Friday 03/01/19.

## 2019-02-25 NOTE — Telephone Encounter (Signed)
Lmom, waiting on a return call.  

## 2019-02-25 NOTE — Telephone Encounter (Signed)
At this point I would like to get Dificid for patient. She should be much better on vancomycin by now. Continue vancomycin until we get dificid into her hands.  Please use specialty pharmacy to get Dificid. 200mg  BID for 10 days.   Elmo Putt, I sent you email to the link for [facility] pharmacy.

## 2019-02-26 NOTE — Telephone Encounter (Signed)
Pt is aware. Checking into Dificid from specialty pharmacy.

## 2019-02-28 NOTE — Telephone Encounter (Signed)
LM for Erica Beasley the Rep. I was given the number to [facility] pharmacy that wasn't able to help me get the medication for pt. Waiting on a return call from the Rep. Pt is aware.

## 2019-03-01 NOTE — Telephone Encounter (Signed)
Rep returned my call. [facility] in [place] is the pharmacy needed [phone]. Spoke with pharmacy and they will contact pt with shipment details and if a PA is needed. Ov note and cdiff lab faxed to [phone] per [facility] pharmacy. Pt should receive her medication this weekend.

## 2019-03-05 NOTE — Telephone Encounter (Signed)
PA for Difacid was completed by [facility] and good through 03/11/19. Letter will be scanned in pts chart.

## 2019-03-11 NOTE — Telephone Encounter (Signed)
Please make patient an ov

## 2019-03-12 ENCOUNTER — Encounter: Payer: Self-pay | Admitting: Internal Medicine

## 2019-03-12 NOTE — Telephone Encounter (Signed)
SCHEDULED AND LETTER SENT  °

## 2019-03-28 ENCOUNTER — Telehealth: Payer: Self-pay | Admitting: Internal Medicine

## 2019-03-28 NOTE — Telephone Encounter (Signed)
Pt called with c/o loose stool s/p C-diff meds. Pt was taking Vancomycin which the pt continued to have diarrhea on. Pt's medication was changed from Vancomycin to Dificid mediation. Pt finished Dificid on 03/15/19 and continues to go to the bathroom 10 times daily. Pt didn't notice much improvement on Dificid. Pt continued to have 10 bowel movements daily. Pt isn't having watery stool but soft not formed bowel movements. Pt reports no abdominal pain or nausea/vomiting. Please advise in the absence of LSL.

## 2019-03-28 NOTE — Telephone Encounter (Signed)
[phone] PLEASE CALL PATIENT, SHE IS STILL HAVING DIARRHEA, SINCE LAST VISIT HAS HAD NO RELIEF

## 2019-04-03 NOTE — Telephone Encounter (Signed)
Pt will add a probiotic and let us know if it isn't helping. Pt will keep apt 04/2019

## 2019-04-03 NOTE — Telephone Encounter (Signed)
For now would recommend adding probiotic. I think LSL was planning to consider TCS if no improvement to check for microscopic colitis.  Further recommendations per LSL

## 2019-05-01 MED ORDER — ACTEMRA ACTPEN 162 MG/0.9 ML SUBCUTANEOUS PEN INJECTOR
3 refills | 0 days
Start: 2019-05-01 — End: ?

## 2019-05-03 ENCOUNTER — Encounter: Payer: Self-pay | Admitting: *Deleted

## 2019-05-03 ENCOUNTER — Telehealth: Payer: Self-pay | Admitting: *Deleted

## 2019-05-03 ENCOUNTER — Ambulatory Visit: Payer: Commercial Managed Care - PPO | Admitting: Gastroenterology

## 2019-05-03 ENCOUNTER — Other Ambulatory Visit: Payer: Self-pay | Admitting: *Deleted

## 2019-05-03 ENCOUNTER — Encounter: Payer: Self-pay | Admitting: Gastroenterology

## 2019-05-03 ENCOUNTER — Other Ambulatory Visit: Payer: Self-pay

## 2019-05-03 VITALS — BP 148/85 | HR 72 | Temp 97.0°F | Ht 64.0 in | Wt 135.2 lb

## 2019-05-03 DIAGNOSIS — D649 Anemia, unspecified: Secondary | ICD-10-CM

## 2019-05-03 DIAGNOSIS — K529 Noninfective gastroenteritis and colitis, unspecified: Secondary | ICD-10-CM | POA: Diagnosis not present

## 2019-05-03 DIAGNOSIS — Z8619 Personal history of other infectious and parasitic diseases: Secondary | ICD-10-CM

## 2019-05-03 DIAGNOSIS — Z860100 Personal history of colon polyps, unspecified: Secondary | ICD-10-CM

## 2019-05-03 HISTORY — DX: Personal history of colonic polyps: Z86.010

## 2019-05-03 HISTORY — DX: Personal history of other infectious and parasitic diseases: Z86.19

## 2019-05-03 HISTORY — DX: Personal history of colon polyps, unspecified: Z86.0100

## 2019-05-03 MED ORDER — PEG 3350-KCL-NA BICARB-NACL 420 G PO SOLR: 4000.0000 mL | Freq: Once | ORAL | 0 refills | Status: AC

## 2019-05-03 NOTE — Unmapped (Signed)
[facility] Specialty Medication Referral: No PA required    Medication (Brand/Generic): Actemra Actpen 162 mg/0.9 mL Pnij    Initial Benefits Investigation Claim completed with resulted information below:  No PA required  Patient ABLE to fill at [facility]  Insurance Company:  [facility]  Anticipated Copay: $100 (Patient's daughter is to call back with co-pay card)    As Co-pay is under $25 defined limit, per policy there will be no further investigation of need for financial assistance at this time unless patient requests. This referral has been communicated to the provider and handed off to the [facility] team for further processing and filling of prescribed medication.   ______________________________________________________________________  Please utilize this referral for viewing purposes as it will serve as the central location for all relevant documentation and updates.

## 2019-05-03 NOTE — Progress Notes (Signed)
Please send copy of office visit to [facility] rheumatology, Leafy Kindle, [place]

## 2019-05-03 NOTE — Progress Notes (Signed)
Primary Care Physician: Glenda Chroman, MD  Primary Gastroenterologist:  Garfield Cornea, MD   Chief Complaint  Patient presents with  . Diarrhea    patient has not had any in 3 weeks    HPI: Erica Beasley is a 63 y.o. female here for follow-up of diarrhea.  We initially met her via virtual visit back in May.  She has had a change in bowel habits since October 2019.  She went from having 1 normal bowel movement a day to having several bowel movements a day.  At first she felt like it was related to metformin.  Came off her metformin and did okay until September 05, 2018.  She developed acute onset nausea vomiting and diarrhea after eating fish with tartar sauce.  Shortly after that she was diagnosed with C. difficile.  Initially given a course of Flagyl by her PCP.  No noted improvement in symptoms.  Subsequently had 2 rounds of vancomycin by PCP and really felt like nothing improved.  Only new medication was Lyrica.  She is on multiple immunosuppressants due to her RA. 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  Last colonoscopy by Dr. Posey Pronto, a couple of polyps per patient.  Back in May she had negative celiac serologies.  C. difficile GDH antigen positive, toxin A+B not detected.  Follow-up PCR was positive.  GI pathogen panel was negative.  At that point I wanted to use Dificid but it was not available over the weekend at her pharmacy.  She was started on a vancomycin taper.  Eventually we transitioned her to Dificid 200 mg twice daily for 10 days because of poor response to vancomycin.  She completed Dificid June 19.  Continues to have 10 stools per day without much improvement. Started probiotic in July.   Now having 2-4 stools per day, have a bad odor. Not as formed as they used to be but much better. Sometimes still has urgency. No melena, brbpr. No abdominal pain. No heartburn or indigestion. RA makes work difficult. May have to retire early.   Current Outpatient Medications   Medication Sig Dispense Refill  . citalopram (CELEXA) 20 MG tablet Take 20 mg by mouth daily.    . Fluticasone Furoate-Vilanterol (BREO ELLIPTA IN) Inhale into the lungs as needed.    Marland Kitchen glipiZIDE (GLUCOTROL) 10 MG tablet Take 10 mg by mouth 2 (two) times daily before a meal.    . hydroxychloroquine (PLAQUENIL) 200 MG tablet Take 200 mg by mouth daily.    Marland Kitchen leflunomide (ARAVA) 20 MG tablet Take 20 mg by mouth daily.    Marland Kitchen levothyroxine (SYNTHROID, LEVOTHROID) 112 MCG tablet Take 112 mcg by mouth daily before breakfast.    . potassium chloride (K-DUR) 10 MEQ tablet Take 10 mEq by mouth daily.    . pravastatin (PRAVACHOL) 40 MG tablet Take 40 mg by mouth daily.    . predniSONE (DELTASONE) 5 MG tablet Take 10 mg by mouth daily with breakfast. Currently taking a dose pak for arthritis    . pregabalin (LYRICA) 75 MG capsule Take 100 mg by mouth daily.     . raloxifene (EVISTA) 60 MG tablet Take 60 mg by mouth daily.     No current facility-administered medications for this visit.     Allergies as of 05/03/2019  . (No Known Allergies)   Past Medical History:  Diagnosis Date  . COPD (chronic obstructive pulmonary disease) ([place])   . Depression   .  Diabetes ([place])   . Hyperlipidemia   . Hypothyroidism   . Neuropathy   . Rheumatoid arthritis [facility])    Past Surgical History:  Procedure Laterality Date  . YAG LASER APPLICATION Left 03/00/9233   Procedure: YAG LASER APPLICATION;  Surgeon: Williams Che, MD;  Location: [facility];  Service: Ophthalmology;  Laterality: Left;   Family History  Problem Relation Age of Onset  . Stroke Mother   . Heart disease Mother   . Diabetes Mother   . Other Father        stomach taken out for some reason  . Colon cancer Neg Hx    Social History   Tobacco Use  . Smoking status: Former Smoker    Packs/day: 1.00    Years: 15.00    Pack years: 15.00    Types: Cigarettes  . Smokeless tobacco: Former Network engineer Use Topics  . Alcohol use: Never     Frequency: Never  . Drug use: Never    ROS:  General: Negative for anorexia, weight loss, fever, chills, fatigue, weakness. ENT: Negative for hoarseness, difficulty swallowing , nasal congestion. CV: Negative for chest pain, angina, palpitations, dyspnea on exertion, peripheral edema.  Respiratory: Negative for dyspnea at rest, dyspnea on exertion, cough, sputum, wheezing.  GI: See history of present illness. GU:  Negative for dysuria, hematuria, urinary incontinence, urinary frequency, nocturnal urination.  Endo: Negative for unusual weight change.    Physical Examination:   BP (!) 148/85   Pulse 72   Temp (!) 97 F (36.1 C) (Oral)   Ht _0  (1.626 m)   Wt 135 lb 3.2 oz (61.3 kg)   BMI 23.21 kg/m   General: Well-nourished, well-developed in no acute distress.  Eyes: No icterus. Mouth: Oropharyngeal mucosa moist and pink , no lesions erythema or exudate. Lungs: Clear to auscultation bilaterally.  Heart: Regular rate and rhythm, no murmurs rubs or gallops.  Abdomen: Bowel sounds are normal, nontender, nondistended, no hepatosplenomegaly or masses, no abdominal bruits or hernia , no rebound or guarding.   Extremities: No lower extremity edema. RA changes in hands. Neuro: Alert and oriented x 4   Skin: Warm and dry, no jaundice.   Psych: Alert and cooperative, normal mood and affect.  Labs:  Lab Results  Component Value Date   CREATININE 0.64 02/01/2019   BUN 8 02/01/2019   NA 140 02/01/2019   K 4.0 02/01/2019   CL 105 02/01/2019   CO2 24 02/01/2019   Lab Results  Component Value Date   ALT 11 02/01/2019   AST 15 02/01/2019   BILITOT 0.2 02/01/2019   Lab Results  Component Value Date   WBC 6.3 02/01/2019   HGB 11.0 (L) 02/01/2019   HCT 34.1 (L) 02/01/2019   MCV 86.3 02/01/2019   PLT 372 02/01/2019   No results found for: LIPASE   No results found for: TSH  Imaging Studies: No results found.   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 difficile colitis, colon polyps.  She had refractory course of C. difficile requiring multiple rounds of vancomycin and ultimately Dificid.  Finally significant improvement although she is not back at baseline.  For her rheumatoid arthritis she is on multiple immunosuppressants including Plaquenil, Arava, low-dose prednisone, previously Morrie Sheldon which has been stopped.  Given persistent change in bowel habits, history of colon polyps, normocytic anemia offered her colonoscopy in the near  future.  Plan for deep sedation.  I have discussed the risks, alternatives, benefits with regards to but not limited to the risk of reaction to medication, bleeding, infection, perforation and the patient is agreeable to proceed. Written consent to be obtained.  She is aware to notify us if her diarrhea returns.

## 2019-05-03 NOTE — Patient Instructions (Signed)
1. Colonoscopy as scheduled. See separate instructions.  2. Call if you have any recurrent diarrhea.

## 2019-05-03 NOTE — Telephone Encounter (Signed)
submitted PA via [facility] website. Case # [id number].

## 2019-05-06 NOTE — Progress Notes (Signed)
cc'ed to Leafy Kindle, [place]

## 2019-05-06 NOTE — Progress Notes (Signed)
CC'D TO PCP °

## 2019-05-07 ENCOUNTER — Encounter: Payer: Self-pay | Admitting: *Deleted

## 2019-05-07 NOTE — Unmapped (Signed)
[facility]   Patient Onboarding/Medication Counseling    Kristin Mccormick is a 63 y.o. female with RA who I am counseling today on initiation of therapy.  I am speaking to the patient.    Verified patient's date of birth / HIPAA.    Specialty medication(s) to be sent: Inflammatory Disorders: Actemra      Non-specialty medications/supplies to be sent: n/a      Medications not needed at this time: n/a       The patient declined counseling on medication administration, missed dose instructions, goals of therapy, side effects and monitoring parameters, warnings and precautions, drug/food interactions and storage, handling precautions, and disposal because they were counseled in clinic. The information in the declined sections below are for informational purposes only and was not discussed with patient.     Actemra?? (tocilizumab)    Medication & Administration     Dosage: Rheumatoid arthritis (less than 100kg): Inject 162mg  under the skin once every other week    Lab tests required prior to treatment initiation:  ? Tuberculosis: Tuberculosis screening not documented in the patient's chart but medication prescriber has indicated they are aware and wishing to initiate treatment at this time.  ? Absolute neutrophil count: ANC not documented in the patient's chart but medication prescriber has indicated they are aware and wishing to initiate treatment at this time.  ? Platelet count: Platelet count not documented in the patient's chart but medication prescriber has indicated they are aware and wishing to initiate treatment at this time.  ? Liver function tests: ALT and AST not documented in the patient's chart but medication prescriber has indicated they are aware and wishing to initiate treatment at this time.      Administration:     Prefilled auto-injector pen  1. Gather all supplies needed for injection on a clean, flat working surface: medication pen removed from packaging, alcohol swab, sharps container, etc.  2. Look at the medication label ??? look for correct medication, correct dose, and check the expiration date  3. Look at the medication ??? the liquid visible in the window on the side of the pen device should appear clear and colorless to pale yellow  4. Lay the auto-injector pen on a flat surface and allow it to warm up to room temperature for at least 45 minutes  5. Select injection site ??? you can use the front of your thigh or your belly (but not the area 2 inches around your belly button); if someone else is giving you the injection you can also use your upper arm in the skin covering your triceps muscle  6. Prepare injection site ??? wash your hands and clean the skin at the injection site with an alcohol swab and let it air dry, do not touch the injection site again before the injection  7. Twist and pull off the green safety cap, do not remove until immediately prior to injection and do not touch the needle shield  8. Pinch the skin ??? with your hand not holding the auto-injector pinch up a fold of skin at the injection site using your forefinger and thumb to prepare a firm injection site  9. Put the needle shield against your skin at the injection site at a 90 degree angle, hold the pen such that you can see the clear medication window  10. To initiate the injection unlock the green activation button by pressing the needle shield completely in against the injection site, then depress  the green activation button to start the injection ??? you will hear a click sound  11. Continue to press the green activation button and hold the pen firmly against your skin for about 10 seconds ??? the window will start to turn solid purple  12. There will be a second click sound when the injection is complete, verify the window is solid purple before pulling the pen away from your skin  13. Dispose of the used auto-injector pen immediately in your sharps disposal container the needle will be covered automatically  14. If you see any blood at the injection site, press a cotton ball or gauze on the site and maintain pressure until the bleeding stops, do not rub the injection site      Adherence/Missed dose instructions:  If your injection is given more than 2 days after your scheduled injection date ??? consult your pharmacist for additional instructions on how to adjust your dosing schedule.      Goals of Therapy       Rheumatoid arthritis  ? Achieve low disease activity or remission  ? Slow disease progression  ? Protection of remaining articular structures  ? Maintenance of function  ? Maintenance of effective psychosocial functioning      Side Effects & Monitoring Parameters     ? Injection site reaction (redness, irritation, inflammation localized to the site of administration)  ? Signs of a common cold ??? minor sore throat, runny or stuffy nose, etc.  ? Headache    The following side effects should be reported to the provider:  ? Signs of a hypersensitivity reaction ??? rash; hives; itching; red, swollen, blistered, or peeling skin; wheezing; tightness in the chest or throat; difficulty breathing, swallowing, or talking; swelling of the mouth, face, lips, tongue, or throat; etc.  ? Reduced immune function ??? report signs of infection such as fever; chills; body aches; very bad sore throat; ear or sinus pain; cough; more sputum or change in color of sputum; pain with passing urine; wound that will not heal, etc.  Also at a slightly higher risk of some malignancies (mainly skin and blood cancers) due to this reduced immune function.  o In the case of signs of infection ??? the patient should hold the next dose of Actemra?? and call your primary care provider to ensure adequate medical care.  Treatment may be resumed when infection is treated and patient is asymptomatic.  ? Signs of unexplained bruising or bleeding ??? throwing up blood or emesis that looks like coffee grounds; black, tarry, or bloody stool; etc.  ? Changes in skin ??? a new growth or lump that forms; changes in shape, size, or color of a previous mole or marking  ? Shortness of breath  ? Chest pain/pressure      Warnings, Precautions, & Contraindications     ? Have your bloodwork checked as you have been told by your prescriber (ANC, platelets, lipids, etc.)  ? Patients with concomitant IBD or history of diverticulitis are at an increased risk for GI perforations.  ? Birth control pills and other hormone-based birth control may not work as well to prevent pregnancy  ? Talk with your doctor if you are pregnant, planning to become pregnant, or breastfeeding  ? Discuss the possible need for holding your dose(s) of Actemra?? when a planned procedure is scheduled with the prescriber as it may delay healing/recovery timeline       Drug/Food Interactions     ? Medication list reviewed  in Epic. The patient was instructed to inform the care team before taking any new medications or supplements. No drug interactions identified.   ? Talk with you prescriber or pharmacist before receiving any live vaccinations while taking this medication and after you stop taking it    Storage, Handling Precautions, & Disposal     ? Store this medication in the refrigerator.  Do not freeze  ? If needed, you may store at room temperature for up to 14 days  ? Store in [employer], protected from light  ? Do not shake  ? Dispose of used syringes/pens in a sharps disposal container      Current Medications (including OTC/herbals), Comorbidities and Allergies     Current Outpatient Medications   Medication Sig Dispense Refill   ??? tocilizumab (ACTEMRA ACTPEN) 162 mg/0.9 mL PnIj Inject the contents of 1 pen (162 mg) under the skin every other week 1.8 mL 3   ??? tofacitinib (XELJANZ XR) 11 mg Tb24 Take 1 tablet (11 mg) by mouth daily. 30 tablet 0     No current facility-administered medications for this visit.        No Known Allergies    There is no problem list on file for this patient.      Reviewed and up to date in Epic.    Appropriateness of Therapy     Is medication and dose appropriate based on diagnosis? Yes    Baseline Quality of Life Assessment      How many days over the past month did your condition keep you from your normal activities? 0    Financial Information     Medication Assistance provided: Prior Authorization and Copay Assistance    Anticipated copay of $5 reviewed with patient. Verified delivery address.    Delivery Information     Scheduled delivery date: 05/09/19    Expected start date: 05/09/19    Medication will be delivered via UPS to the home address in [facility].  This shipment will not require a signature.      Explained the services we provide at [facility] and that each month we would call to set up refills.  Stressed importance of returning phone calls so that we could ensure they receive their medications in time each month.  Informed patient that we should be setting up refills 7-10 days prior to when they will run out of medication.  A pharmacist will reach out to perform a clinical assessment periodically.  Informed patient that a welcome packet and a drug information handout will be sent.      Patient verbalized understanding of the above information as well as how to contact the pharmacy at [phone] option 4 with any questions/concerns.  The pharmacy is open Monday through Friday 8:30am-4:30pm.  A pharmacist is available 24/7 via pager to answer any clinical questions they may have.    Patient Specific Needs     ? Does the patient have any physical, cognitive, or cultural barriers? No    ? Patient prefers to have medications discussed with  Patient     ? Is the patient able to read and understand education materials at a high school level or above? Yes    ? Patient's primary language is  English     ? Is the patient high risk? No     ? Does the patient require a Care Management Plan? No     ? Does the patient require physician  intervention or other additional services (i.e. nutrition, smoking cessation, social work)? No      Kristin Mccormick  Anders Grant  [facility] Specialty Pharmacist

## 2019-05-08 ENCOUNTER — Encounter: Payer: Self-pay | Admitting: Gastroenterology

## 2019-05-08 MED FILL — ACTEMRA ACTPEN 162 MG/0.9 ML SUBCUTANEOUS PEN INJECTOR: 28 days supply | Qty: 2 | Fill #0 | Status: AC

## 2019-05-08 MED FILL — ACTEMRA ACTPEN 162 MG/0.9 ML SUBCUTANEOUS PEN INJECTOR: 28 days supply | Qty: 1.8 | Fill #0

## 2019-05-09 NOTE — Telephone Encounter (Signed)
No P.A is required:

## 2019-05-29 MED ORDER — ACTEMRA ACTPEN 162 MG/0.9 ML SUBCUTANEOUS PEN INJECTOR
SUBCUTANEOUS | 3 refills | 0 days
Start: 2019-05-29 — End: ?

## 2019-06-10 NOTE — Unmapped (Signed)
[facility] Clinical Assessment & Refill Coordination Note    ALVILDA MCKENNA, DOB: 21-Jun-1956  Phone: [phone] (home)     All above HIPAA information was verified with patient.     Specialty Medication(s):   Inflammatory Disorders: Actemra     Current Outpatient Medications   Medication Sig Dispense Refill   ??? tocilizumab (ACTEMRA ACTPEN) 162 mg/0.9 mL PnIj Inject the contents of 1 pen (162 mg) under the skin every other week 1.8 mL 3   ??? tocilizumab (ACTEMRA ACTPEN) 162 mg/0.9 mL PnIj Inject the contents of 1 pen (162mg ) under the skin once a week as directed 3.6 mL 3   ??? tofacitinib (XELJANZ XR) 11 mg Tb24 Take 1 tablet (11 mg) by mouth daily. 30 tablet 0     No current facility-administered medications for this visit.         Changes to medications: Bertice reports no changes at this time.    No Known Allergies    Changes to allergies: No    SPECIALTY MEDICATION ADHERENCE     Actemra 162 /0.3ml: 0 days of medicine on hand       Medication Adherence    Patient reported X missed doses in the last month: 0  Specialty Medication: Actemra  Patient is on additional specialty medications: No          Specialty medication(s) dose(s) confirmed: Patient is now taking weekly.     Are there any concerns with adherence? No    Adherence counseling provided? Not needed    CLINICAL MANAGEMENT AND INTERVENTION      Clinical Benefit Assessment:    Do you feel the medicine is effective or helping your condition? Yes    Clinical Benefit counseling provided? Not needed    Adverse Effects Assessment:    Are you experiencing any side effects? No    Are you experiencing difficulty administering your medicine? No    Quality of Life Assessment:    How many days over the past month did your conditon  keep you from your normal activities? For example, brushing your teeth or getting up in the morning. 0    Have you discussed this with your provider? Not needed    Therapy Appropriateness:    Is therapy appropriate? Yes, therapy is appropriate and should be continued    DISEASE/MEDICATION-SPECIFIC INFORMATION      For patients on injectable medications: Patient currently has 0 doses left.  Next injection is scheduled for 06/15/19.    PATIENT SPECIFIC NEEDS     ? Does the patient have any physical, cognitive, or cultural barriers? No    ? Is the patient high risk? No     ? Does the patient require a Care Management Plan? No     ? Does the patient require physician intervention or other additional services (i.e. nutrition, smoking cessation, social work)? No      SHIPPING     Specialty Medication(s) to be Shipped:   Inflammatory Disorders: Actemra    Other medication(s) to be shipped: n/a     Changes to insurance: No    Delivery Scheduled: Yes, Expected medication delivery date: 06/13/19.     Medication will be delivered via UPS to the confirmed home address in [facility].    The patient will receive a drug information handout for each medication shipped and additional FDA Medication Guides as required.  Verified that patient has previously received a Conservation officer, historic buildings.    All  of the patient's questions and concerns have been addressed.    Peretz Thieme  Anders Grant   [facility] Specialty Pharmacist

## 2019-06-12 MED FILL — ACTEMRA ACTPEN 162 MG/0.9 ML SUBCUTANEOUS PEN INJECTOR: 28 days supply | Qty: 4 | Fill #0 | Status: AC

## 2019-06-12 MED FILL — ACTEMRA ACTPEN 162 MG/0.9 ML SUBCUTANEOUS PEN INJECTOR: SUBCUTANEOUS | 28 days supply | Qty: 3.6 | Fill #0

## 2019-06-27 DIAGNOSIS — U071 COVID-19: Secondary | ICD-10-CM

## 2019-06-27 HISTORY — DX: COVID-19: U07.1

## 2019-07-01 ENCOUNTER — Encounter: Admit: 2019-07-01 | Discharge: 2019-07-01 | Payer: PRIVATE HEALTH INSURANCE | Attending: Family | Primary: Family

## 2019-07-01 ENCOUNTER — Ambulatory Visit: Admit: 2019-07-01 | Discharge: 2019-07-02 | Attending: Family | Primary: Family

## 2019-07-01 DIAGNOSIS — B349 Viral infection, unspecified: Secondary | ICD-10-CM

## 2019-07-01 NOTE — Unmapped (Signed)
JOYIA RIEHLE  September 30, 1955  07/01/19    Vassie Loll presents to the [facility] for COVID-19 testing.     AKEISHA LAGERQUIST is asymptomatic and has been referred for COVID-19 testing based on exposure to COVID-19 or chronic medical conditions.     Patient has tested positive for COVID-19 in the last 21 days: no  Swab for COVID-19 obtained and sent to [facility] for testing.    Patient provided the following information; patient and all households members remain at home until the results come back. A work note was provided, if requested. Patient also informed to notify his/her primary care physician if patient develops any new symptoms such as subjective fever, chills, muscle aches, runny nose, sore throat, loss of taste or smell, cough (new or worsening of chronic cough), shortness of breath, diarrhea, fatigue (new or worsening), abdominal pain, nausea or vomiting, headache.      Randon Goldsmith, CMA

## 2019-07-01 NOTE — Unmapped (Signed)
Addended by: Randon Goldsmith on: 07/01/2019 11:14 AM     Modules accepted: Orders

## 2019-07-02 ENCOUNTER — Telehealth: Payer: Self-pay | Admitting: Internal Medicine

## 2019-07-02 ENCOUNTER — Encounter: Payer: Self-pay | Admitting: *Deleted

## 2019-07-02 NOTE — Unmapped (Signed)
Positive COVID result recieved.  Patient notified via Telephone call      Patient notified of the following:    Are you currently having symptoms: No. You may not show any symptoms, or you may begin to show symptoms in the coming days. Common symptoms of COVID-19 include fever or chills, cough or shortness of breath, loss of sense of smell or taste, diarrhea or nausea and headaches. We recommend, even if you are not showing any symptoms, you self-isolate and remain in your home.  If you do not develop any symptoms, you can discontinue isolation after at least ten (10) days have passed since you tested positive for COVID-19. We also recommend you stay six (6) feet away from others and wear a cloth covering for your nose and mouth when you are around other people three (3) days after you stop isolation. You should coordinate your guidance around returning to work with your employer. We also recommend that members of your household self-isolate and remain in the home while you are self-isolating AND for 14 days after you discontinue self-isolation.     Household members should self-monitor for any fever and symptoms of COVID-19 such as cough, shortness of breath, sore throat or loss of smell or taste. If household members develop symptoms, they should contact their primary care provider. Recommendations for self-isolation will change if you develop symptoms. Please notify us if you develop symptoms. To prevent the spread of Coronavirus (COVID-19), we recommend that you and everyone in your household stay at home as much as possible. Keep six (6) feet of distance between you and other people. Wash your hands frequently and avoid touching your face. The CDC now recommends wearing cloth face coverings or masks in public settings where other physical distancing measures are difficult to maintain, such as grocery stores and pharmacies. Patient verbalizes understanding.      To avoid spreading the virus:  -Cough or sneeze into a tissue. Then throw the tissue away.  -If you don't have a tissue, use your hand to cover your cough or sneeze. Then clean your hand. You can also cough into your sleeve.  -Wash your hands often. Use soap and warm water. Wash for 15 to 20 seconds each time.  -If you don't have soap and water near you, you can clean your hands with alcohol wipes or gel.    Call your doctor now or seek immediate medical care if:  -You have a new or higher fever.  -Your fever lasts more than 48 hours.  -You have trouble breathing.  -You have a fever with a stiff neck or a severe headache.  -You are sensitive to light.  -You feel very sleepy or confused.    Offered work note: Declined.     Are you a healthcare worker: No    Are you currently pregnant: No    No emergency contact information on file.     Advised good hand hygiene, physical distancing, and to remain home as much as possible and to call back with worsening symptoms or any new issues. Patient advised to call primary care provider during regular business hours or [facility] [phone]) for any after hour needs/issues.

## 2019-07-02 NOTE — Telephone Encounter (Signed)
Called patient. She tested positive for COVID-19 yesterday at work. She needs to cancel procedure. She has been rescheduled to 12/21 at 10:30am. Patient aware will mail new instructions with new pre-op appt. Called endo and LMOVM making aware.

## 2019-07-02 NOTE — Telephone Encounter (Signed)
Pt said her covid test was positive and needed to cancel her procedure with [facility] on Monday,.

## 2019-07-05 ENCOUNTER — Encounter (HOSPITAL_COMMUNITY): Admission: RE | Admit: 2019-07-05 | Payer: Commercial Managed Care - PPO | Source: Ambulatory Visit

## 2019-07-05 ENCOUNTER — Other Ambulatory Visit (HOSPITAL_COMMUNITY): Payer: Commercial Managed Care - PPO

## 2019-07-05 NOTE — Unmapped (Signed)
[facility] Refill Coordination Note    Specialty Medication(s) to be Shipped:   Inflammatory Disorders: Actemra    Other medication(s) to be shipped: n/a     Kristin Mccormick, DOB: 1956-04-21  Phone: [phone] (home)       All above HIPAA information was verified with patient.     Completed refill call assessment today to schedule patient's medication shipment from the [facility] [phone]).       Specialty medication(s) and dose(s) confirmed: Regimen is correct and unchanged.   Changes to medications: [name] reports no changes at this time.  Changes to insurance: No  Questions for the pharmacist: No    Confirmed patient received Welcome Packet with first shipment. The patient will receive a drug information handout for each medication shipped and additional FDA Medication Guides as required.       DISEASE/MEDICATION-SPECIFIC INFORMATION        N/A    SPECIALTY MEDICATION ADHERENCE     Medication Adherence    Patient reported X missed doses in the last month: 0  Specialty Medication: Actemra 162mg /0.16ml  Patient is on additional specialty medications: No  Informant: patient                Actemra 162 mg/0.68ml: 7 days of medicine on hand         SHIPPING     Shipping address confirmed in Epic.     Delivery Scheduled: Yes, Expected medication delivery date: 07/10/19.     Medication will be delivered via UPS to the home address in Epic W[facility].    [name]   [facility] Specialty Technician

## 2019-07-09 MED FILL — ACTEMRA ACTPEN 162 MG/0.9 ML SUBCUTANEOUS PEN INJECTOR: SUBCUTANEOUS | 28 days supply | Qty: 3.6 | Fill #1

## 2019-07-09 MED FILL — ACTEMRA ACTPEN 162 MG/0.9 ML SUBCUTANEOUS PEN INJECTOR: 28 days supply | Qty: 4 | Fill #1 | Status: AC

## 2019-08-06 NOTE — Unmapped (Signed)
[facility] Refill Coordination Note    Specialty Medication(s) to be Shipped:   Inflammatory Disorders: Actemra    Other medication(s) to be shipped: n/a     Kristin Mccormick, DOB: [birth year]  Phone: [phone] (home)       All above HIPAA information was verified with patient.     Completed refill call assessment today to schedule patient's medication shipment from the [facility] [phone]).       Specialty medication(s) and dose(s) confirmed: Regimen is correct and unchanged.   Changes to medications: Kristin Mccormick reports no changes at this time.  Changes to insurance: No  Questions for the pharmacist: No    Confirmed patient received Welcome Packet with first shipment. The patient will receive a drug information handout for each medication shipped and additional FDA Medication Guides as required.       DISEASE/MEDICATION-SPECIFIC INFORMATION        For patients on injectable medications: Patient currently has 0 doses left.  Next injection is scheduled for 08/10/2019.    SPECIALTY MEDICATION ADHERENCE     Medication Adherence    Patient reported X missed doses in the last month: 0  Specialty Medication: Actemra 162mg /0.14ml  Patient is on additional specialty medications: No  Any gaps in refill history greater than 2 weeks in the last 3 months: no  Demonstrates understanding of importance of adherence: yes  Informant: patient  Reliability of informant: reliable  Confirmed plan for next specialty medication refill: delivery by pharmacy  Refills needed for supportive medications: not needed                Actemra 162mg /0.84ml. 0 on hand      SHIPPING     Shipping address confirmed in Epic.     Delivery Scheduled: Yes, Expected medication delivery date: 08/08/2019.     Medication will be delivered via UPS to the prescription address in Epic WAM.    Kristin Mccormick D Ruchel Brandenburger   [facility] Specialty Technician

## 2019-08-07 MED FILL — ACTEMRA ACTPEN 162 MG/0.9 ML SUBCUTANEOUS PEN INJECTOR: 28 days supply | Qty: 4 | Fill #2 | Status: AC

## 2019-08-07 MED FILL — ACTEMRA ACTPEN 162 MG/0.9 ML SUBCUTANEOUS PEN INJECTOR: SUBCUTANEOUS | 28 days supply | Qty: 3.6 | Fill #2

## 2019-09-02 NOTE — Unmapped (Signed)
[facility] Refill Coordination Note    Specialty Medication(s) to be Shipped:   Inflammatory Disorders: Actemra    Other medication(s) to be shipped: n/a     Mart Piggs, DOB: 1956/01/21  Phone: [phone] (home)       All above HIPAA information was verified with patient.     Was a Nurse, learning disability used for this call? No    Completed refill call assessment today to schedule patient's medication shipment from the [facility] [phone]).       Specialty medication(s) and dose(s) confirmed: Regimen is correct and unchanged.   Changes to medications: Giannah reports no changes at this time.  Changes to insurance: No  Questions for the pharmacist: No    Confirmed patient received Welcome Packet with first shipment. The patient will receive a drug information handout for each medication shipped and additional FDA Medication Guides as required.       DISEASE/MEDICATION-SPECIFIC INFORMATION        N/A    SPECIALTY MEDICATION ADHERENCE     Medication Adherence    Patient reported X missed doses in the last month: 0  Specialty Medication: Actemra 162mg /0.47ml  Informant: patient                Actemra 162 mg/0.32ml: 7 days of medicine on hand         SHIPPING     Shipping address confirmed in Epic.     Delivery Scheduled: Yes, Expected medication delivery date: 09/04/19.     Medication will be delivered via UPS to the prescription address in Epic WAM.    [name]   [facility] Specialty Technician

## 2019-09-03 MED FILL — ACTEMRA ACTPEN 162 MG/0.9 ML SUBCUTANEOUS PEN INJECTOR: 28 days supply | Qty: 4 | Fill #3 | Status: AC

## 2019-09-03 MED FILL — ACTEMRA ACTPEN 162 MG/0.9 ML SUBCUTANEOUS PEN INJECTOR: SUBCUTANEOUS | 28 days supply | Qty: 3.6 | Fill #3

## 2019-09-12 ENCOUNTER — Other Ambulatory Visit: Payer: Self-pay

## 2019-09-12 ENCOUNTER — Encounter (HOSPITAL_COMMUNITY): Payer: Self-pay

## 2019-09-13 ENCOUNTER — Encounter (HOSPITAL_COMMUNITY)
Admission: RE | Admit: 2019-09-13 | Discharge: 2019-09-13 | Disposition: A | Discharge status: Home / Self Care | Payer: Commercial Managed Care - PPO | Source: Ambulatory Visit | Attending: Internal Medicine | Admitting: Internal Medicine

## 2019-09-13 ENCOUNTER — Other Ambulatory Visit (HOSPITAL_COMMUNITY)
Admission: RE | Admit: 2019-09-13 | Discharge: 2019-09-13 | Disposition: A | Discharge status: Home / Self Care | Payer: Commercial Managed Care - PPO | Source: Ambulatory Visit | Attending: Internal Medicine | Admitting: Internal Medicine

## 2019-09-13 ENCOUNTER — Other Ambulatory Visit (HOSPITAL_COMMUNITY): Payer: Commercial Managed Care - PPO

## 2019-09-13 DIAGNOSIS — Z01812 Encounter for preprocedural laboratory examination: Secondary | ICD-10-CM | POA: Insufficient documentation

## 2019-09-13 DIAGNOSIS — Z20828 Contact with and (suspected) exposure to other viral communicable diseases: Secondary | ICD-10-CM | POA: Insufficient documentation

## 2019-09-13 LAB — SARS CORONAVIRUS 2 (TAT 6-24 HRS): SARS Coronavirus 2: NEGATIVE

## 2019-09-16 ENCOUNTER — Other Ambulatory Visit: Payer: Self-pay

## 2019-09-16 ENCOUNTER — Encounter (HOSPITAL_COMMUNITY): Payer: Self-pay | Admitting: Internal Medicine

## 2019-09-16 ENCOUNTER — Ambulatory Visit (HOSPITAL_COMMUNITY): Payer: Commercial Managed Care - PPO | Admitting: Anesthesiology

## 2019-09-16 ENCOUNTER — Encounter (HOSPITAL_COMMUNITY)
Admission: RE | Disposition: A | Discharge status: Home / Self Care | Payer: Self-pay | Source: Home / Self Care | Attending: Internal Medicine

## 2019-09-16 ENCOUNTER — Ambulatory Visit (HOSPITAL_COMMUNITY)
Admission: RE | Admit: 2019-09-16 | Discharge: 2019-09-16 | Disposition: A | Discharge status: Home / Self Care | Payer: Commercial Managed Care - PPO | Attending: Internal Medicine | Admitting: Internal Medicine

## 2019-09-16 DIAGNOSIS — E119 Type 2 diabetes mellitus without complications: Secondary | ICD-10-CM | POA: Diagnosis not present

## 2019-09-16 DIAGNOSIS — J449 Chronic obstructive pulmonary disease, unspecified: Secondary | ICD-10-CM | POA: Insufficient documentation

## 2019-09-16 DIAGNOSIS — Z8619 Personal history of other infectious and parasitic diseases: Secondary | ICD-10-CM | POA: Insufficient documentation

## 2019-09-16 DIAGNOSIS — E785 Hyperlipidemia, unspecified: Secondary | ICD-10-CM | POA: Insufficient documentation

## 2019-09-16 DIAGNOSIS — M069 Rheumatoid arthritis, unspecified: Secondary | ICD-10-CM | POA: Insufficient documentation

## 2019-09-16 DIAGNOSIS — Z1211 Encounter for screening for malignant neoplasm of colon: Secondary | ICD-10-CM | POA: Diagnosis present

## 2019-09-16 DIAGNOSIS — Z79899 Other long term (current) drug therapy: Secondary | ICD-10-CM | POA: Insufficient documentation

## 2019-09-16 DIAGNOSIS — Z8379 Family history of other diseases of the digestive system: Secondary | ICD-10-CM | POA: Diagnosis not present

## 2019-09-16 DIAGNOSIS — D649 Anemia, unspecified: Secondary | ICD-10-CM | POA: Insufficient documentation

## 2019-09-16 DIAGNOSIS — Z8249 Family history of ischemic heart disease and other diseases of the circulatory system: Secondary | ICD-10-CM | POA: Diagnosis not present

## 2019-09-16 DIAGNOSIS — K5730 Diverticulosis of large intestine without perforation or abscess without bleeding: Secondary | ICD-10-CM | POA: Insufficient documentation

## 2019-09-16 DIAGNOSIS — Z794 Long term (current) use of insulin: Secondary | ICD-10-CM | POA: Diagnosis not present

## 2019-09-16 DIAGNOSIS — Z87891 Personal history of nicotine dependence: Secondary | ICD-10-CM | POA: Diagnosis not present

## 2019-09-16 DIAGNOSIS — Z823 Family history of stroke: Secondary | ICD-10-CM | POA: Insufficient documentation

## 2019-09-16 DIAGNOSIS — K529 Noninfective gastroenteritis and colitis, unspecified: Secondary | ICD-10-CM | POA: Insufficient documentation

## 2019-09-16 DIAGNOSIS — K640 First degree hemorrhoids: Secondary | ICD-10-CM | POA: Diagnosis not present

## 2019-09-16 DIAGNOSIS — E039 Hypothyroidism, unspecified: Secondary | ICD-10-CM | POA: Diagnosis not present

## 2019-09-16 DIAGNOSIS — F329 Major depressive disorder, single episode, unspecified: Secondary | ICD-10-CM | POA: Diagnosis not present

## 2019-09-16 DIAGNOSIS — Z791 Long term (current) use of non-steroidal anti-inflammatories (NSAID): Secondary | ICD-10-CM | POA: Insufficient documentation

## 2019-09-16 DIAGNOSIS — Z86010 Personal history of colonic polyps: Secondary | ICD-10-CM | POA: Diagnosis not present

## 2019-09-16 HISTORY — PX: COLONOSCOPY WITH PROPOFOL: SHX5780

## 2019-09-16 HISTORY — PX: BIOPSY: SHX5522

## 2019-09-16 LAB — GLUCOSE, CAPILLARY: Glucose-Capillary: 189 mg/dL — ABNORMAL HIGH (ref 70–99)

## 2019-09-16 SURGERY — COLONOSCOPY WITH PROPOFOL
Anesthesia: General

## 2019-09-16 MED ORDER — LACTATED RINGERS IV SOLN: INTRAVENOUS | Status: DC

## 2019-09-16 MED ORDER — PROPOFOL 10 MG/ML IV BOLUS
INTRAVENOUS | Status: AC
  Filled 2019-09-16: qty 40

## 2019-09-16 MED ORDER — PROPOFOL 500 MG/50ML IV EMUL
INTRAVENOUS | Status: DC | PRN
  Administered 2019-09-16: 150 ug/kg/min via INTRAVENOUS

## 2019-09-16 MED ORDER — PROPOFOL 10 MG/ML IV BOLUS
INTRAVENOUS | Status: DC | PRN
  Administered 2019-09-16: 20 mg via INTRAVENOUS
  Administered 2019-09-16: 40 mg via INTRAVENOUS
  Administered 2019-09-16: 20 mg via INTRAVENOUS

## 2019-09-16 MED ORDER — LACTATED RINGERS IV SOLN: INTRAVENOUS | Status: DC | PRN

## 2019-09-16 MED ORDER — PROMETHAZINE HCL 25 MG/ML IJ SOLN: 6.2500 mg | INTRAMUSCULAR | Status: DC | PRN

## 2019-09-16 MED ORDER — MIDAZOLAM HCL 2 MG/2ML IJ SOLN: 0.5000 mg | Freq: Once | INTRAMUSCULAR | Status: DC | PRN

## 2019-09-16 MED ORDER — HYDROMORPHONE HCL 1 MG/ML IJ SOLN: 0.2500 mg | INTRAMUSCULAR | Status: DC | PRN

## 2019-09-16 MED ORDER — HYDROCODONE-ACETAMINOPHEN 7.5-325 MG PO TABS: 1.0000 | ORAL_TABLET | Freq: Once | ORAL | Status: DC | PRN

## 2019-09-16 NOTE — Op Note (Addendum)
[facility] Patient Name: Erica Beasley Procedure Date: 09/16/2019 10:11 AM MRN: [id number] Date of Birth: [birth year] Attending MD: Norvel Richards , MD CSN: [id number] Age: 63 Admit Type: Outpatient Procedure:                Colonoscopy Indications:              High risk colon cancer surveillance: Personal                            history of colonic polyps Providers:                Norvel Richards, MD, Janeece Riggers, RN, Aram Candela Referring MD:              Medicines:                Propofol per Anesthesia Complications:            No immediate complications. Estimated Blood Loss:     Estimated blood loss was minimal. Procedure:                Pre-Anesthesia Assessment:                           - Prior to the procedure, a History and Physical                            was performed, and patient medications and                            allergies were reviewed. The patient's tolerance of                            previous anesthesia was also reviewed. The risks                            and benefits of the procedure and the sedation                            options and risks were discussed with the patient.                            All questions were answered, and informed consent                            was obtained. Prior Anticoagulants: The patient has                            taken no previous anticoagulant or antiplatelet                            agents. ASA Grade Assessment: III - A patient with  severe systemic disease. After reviewing the risks                            and benefits, the patient was deemed in                            satisfactory condition to undergo the procedure.                           After obtaining informed consent, the colonoscope                            was passed under direct vision. Throughout the                            procedure, the patient's blood  pressure, pulse, and                            oxygen saturations were monitored continuously. The                            [id number]) scope was introduced through                            the anus and advanced to the the cecum, identified                            by appendiceal orifice and ileocecal valve. The                            colonoscopy was performed without difficulty. The                            patient tolerated the procedure well. The quality                            of the bowel preparation was adequate. The terminal                            ileum, ileocecal valve, appendiceal orifice, and                            rectum were photographed. Scope In: 10:34:42 AM Scope Out: 10:46:30 AM Scope Withdrawal Time: 0 hours 7 minutes 8 seconds  Total Procedure Duration: 0 hours 11 minutes 48 seconds  Findings:      The perianal and digital rectal examinations were normal.      A few medium-mouthed diverticula were found in the entire colon. Normal       diostal 5 cm of TI.      Non-bleeding internal hemorrhoids were found during retroflexion. The       hemorrhoids were mild, small and Grade I (internal hemorrhoids that do       not prolapse). Anal papilla present.      The exam was otherwise without abnormality on direct and  retroflexion       views. Bx of R and L colon Impression:               - Diverticulosis in the entire examined colon.                           - Non-bleeding internal hemorrhoids.                           - The examination was otherwise normal on direct                            and retroflexion views.                           - S/P Segmental Bx. Moderate Sedation:      Moderate (conscious) sedation was personally administered by an       anesthesia professional. The following parameters were monitored: oxygen       saturation, heart rate, blood pressure, respiratory rate, EKG, adequacy       of pulmonary ventilation, and  response to care. Recommendation:           - Discharge patient to home.                           - Resume previous diet.                           - Continue present medications.                           - Repeat colonoscopy in 5 years for screening                            purposes.                           - Return to GI office in 3 months. Procedure Code(s):        --- Professional ---                           [phone], Colonoscopy, flexible; diagnostic, including                            collection of specimen(s) by brushing or washing,                            when performed (separate procedure) Diagnosis Code(s):        --- Professional ---                           Z86.010, Personal history of colonic polyps                           K64.0, First degree hemorrhoids                           K57.30, Diverticulosis  of large intestine without                            perforation or abscess without bleeding CPT copyright 2019 American Medical Association. All rights reserved. The codes documented in this report are preliminary and upon coder review may  be revised to meet current compliance requirements. Cristopher Estimable. Javaya Oregon, MD Norvel Richards, MD 09/16/2019 10:59:35 AM This report has been signed electronically. Number of Addenda: 0

## 2019-09-16 NOTE — Discharge Instructions (Signed)
  Colonoscopy Discharge Instructions  Read the instructions outlined below and refer to this sheet in the next few weeks. These discharge instructions provide you with general information on caring for yourself after you leave the hospital. Your doctor may also give you specific instructions. While your treatment has been planned according to the most current medical practices available, unavoidable complications occasionally occur. If you have any problems or questions after discharge, call Dr. Gala Romney at [phone]. ACTIVITY  You may resume your regular activity, but move at a slower pace for the next 24 hours.   Take frequent rest periods for the next 24 hours.   Walking will help get rid of the air and reduce the bloated feeling in your belly (abdomen).   No driving for 24 hours (because of the medicine (anesthesia) used during the test).    Do not sign any important legal documents or operate any machinery for 24 hours (because of the anesthesia used during the test).  NUTRITION  Drink plenty of fluids.   You may resume your normal diet as instructed by your doctor.   Begin with a light meal and progress to your normal diet. Heavy or fried foods are harder to digest and may make you feel sick to your stomach (nauseated).   Avoid alcoholic beverages for 24 hours or as instructed.  MEDICATIONS  You may resume your normal medications unless your doctor tells you otherwise.  WHAT YOU CAN EXPECT TODAY  Some feelings of bloating in the abdomen.   Passage of more gas than usual.   Spotting of blood in your stool or on the toilet paper.  IF YOU HAD POLYPS REMOVED DURING THE COLONOSCOPY:  No aspirin products for 7 days or as instructed.   No alcohol for 7 days or as instructed.   Eat a soft diet for the next 24 hours.  FINDING OUT THE RESULTS OF YOUR TEST Not all test results are available during your visit. If your test results are not back during the visit, make an appointment  with your caregiver to find out the results. Do not assume everything is normal if you have not heard from your caregiver or the medical facility. It is important for you to follow up on all of your test results.  SEEK IMMEDIATE MEDICAL ATTENTION IF:  You have more than a spotting of blood in your stool.   Your belly is swollen (abdominal distention).   You are nauseated or vomiting.   You have a temperature over 101.   You have abdominal pain or discomfort that is severe or gets worse throughout the day.   Further recommendations to follow pending review of pathology report  Repeat colonoscopy in 5 years given prior history of colon polyps  Office visit with [place] in 3 months  At patient request, I called daughter at [phone].  Recording stated "all circuits busy"

## 2019-09-16 NOTE — Anesthesia Postprocedure Evaluation (Signed)
Anesthesia Post Note  Patient: Erica Beasley  Procedure(s) Performed: COLONOSCOPY WITH PROPOFOL (N/A ) BIOPSY  Patient location during evaluation: PACU Anesthesia Type: General Level of consciousness: awake and alert and oriented Pain management: pain level controlled Vital Signs Assessment: post-procedure vital signs reviewed and stable Respiratory status: spontaneous breathing Cardiovascular status: blood pressure returned to baseline and stable Postop Assessment: no apparent nausea or vomiting Anesthetic complications: no     Last Vitals:  Vitals:   09/16/19 0930  BP: 136/90  Pulse: 91  Resp: 16  Temp: 36.9 C  SpO2: 96%    Last Pain:  Vitals:   09/16/19 0930  TempSrc: Oral  PainSc: 0-No pain                 Christapher Gillian

## 2019-09-16 NOTE — H&P (Signed)
@LOGO @   Primary Care Physician:  Glenda Chroman, MD Primary Gastroenterologist:  Dr. Gala Romney  Pre-Procedure History & Physical: HPI:  Erica Beasley is a 63 y.o. female here for for colonoscopy.  Intermittent chronic diarrhea.  C. difficile in the past.  History colonic polyps (elsewhere)  Past Medical History:  Diagnosis Date  . COPD (chronic obstructive pulmonary disease) ([place])   . Depression   . Diabetes ([place])   . Hyperlipidemia   . Hypothyroidism   . Neuropathy   . Rheumatoid arthritis [facility])     Past Surgical History:  Procedure Laterality Date  . COLONOSCOPY  09/2010   Dr. Posey Pronto: mild diverticulsis in sigmoid colon.   . YAG LASER APPLICATION Left [id number]   Procedure: YAG LASER APPLICATION;  Surgeon: Williams Che, MD;  Location: [facility];  Service: Ophthalmology;  Laterality: Left;    Prior to Admission medications   Medication Sig Start Date End Date Taking? Authorizing Provider  acetaminophen (TYLENOL) 325 MG tablet Take 650 mg by mouth every 6 (six) hours as needed for moderate pain or headache.   Yes [provider]  citalopram (CELEXA) 20 MG tablet Take 20 mg by mouth daily.   Yes [provider]  diclofenac sodium (VOLTAREN) 1 % GEL Apply 1 application topically 2 (two) times daily. 02/11/19  Yes [provider]  gabapentin (NEURONTIN) 300 MG capsule Take 300 mg by mouth at bedtime.   Yes [provider]  glipiZIDE (GLUCOTROL) 10 MG tablet Take 10 mg by mouth 2 (two) times daily before a meal.   Yes [provider]  hydroxychloroquine (PLAQUENIL) 200 MG tablet Take 200 mg by mouth 2 (two) times daily.    Yes [provider]  leflunomide (ARAVA) 20 MG tablet Take 20 mg by mouth daily.   Yes [provider]  levothyroxine (SYNTHROID, LEVOTHROID) 112 MCG tablet Take 112 mcg by mouth daily before breakfast.   Yes [provider]  potassium chloride (K-DUR) 10 MEQ tablet Take 10 mEq by mouth daily.    Yes [provider]  pravastatin (PRAVACHOL) 40 MG tablet Take 40 mg by mouth at bedtime.    Yes [provider]  predniSONE (DELTASONE) 5 MG tablet Take 5 mg by mouth 2 (two) times daily.    Yes [provider]  Probiotic CAPS Take 1 capsule by mouth daily.   Yes [provider]  raloxifene (EVISTA) 60 MG tablet Take 60 mg by mouth daily.   Yes [provider]  Tocilizumab (ACTEMRA ACTPEN) 162 MG/0.9ML SOAJ Inject 162 mg as directed every Saturday. 05/01/19  Yes [provider]  insulin lispro (HUMALOG) 100 UNIT/ML injection Inject 2-14 Units into the skin 3 (three) times daily before meals.    [provider]    Allergies as of 05/03/2019  . (No Known Allergies)    Family History  Problem Relation Age of Onset  . Stroke Mother   . Heart disease Mother   . Diabetes Mother   . Other Father        stomach taken out for some reason  . Colon cancer Neg Hx     Social History   Socioeconomic History  . Marital status: Widowed    Spouse name: Not on file  . Number of children: Not on file  . Years of education: Not on file  . Highest education level: Not on file  Occupational History  . Not on file  Tobacco Use  . Smoking status:  Former Smoker    Packs/day: 1.00    Years: 15.00    Pack years: 15.00    Types: Cigarettes    Quit date: 09/11/2017    Years since quitting: 2.0  . Smokeless tobacco: Former Network engineer and Sexual Activity  . Alcohol use: Never  . Drug use: Never  . Sexual activity: Not Currently  Other Topics Concern  . Not on file  Social History Narrative  . Not on file   Social Determinants of Health   Financial Resource Strain:   . Difficulty of Paying Living Expenses: Not on file  Food Insecurity:   . Worried About Charity fundraiser in the Last Year: Not on file  . Ran Out of Food in the Last Year: Not on file  Transportation Needs:   . Lack of Transportation (Medical): Not on file   . Lack of Transportation (Non-Medical): Not on file  Physical Activity:   . Days of Exercise per Week: Not on file  . Minutes of Exercise per Session: Not on file  Stress:   . Feeling of Stress : Not on file  Social Connections:   . Frequency of Communication with Friends and Family: Not on file  . Frequency of Social Gatherings with Friends and Family: Not on file  . Attends Religious Services: Not on file  . Active Member of Clubs or Organizations: Not on file  . Attends Archivist Meetings: Not on file  . Marital Status: Not on file  Intimate Partner Violence:   . Fear of Current or Ex-Partner: Not on file  . Emotionally Abused: Not on file  . Physically Abused: Not on file  . Sexually Abused: Not on file    Review of Systems: See HPI, otherwise negative ROS  Physical Exam: BP 136/90   Pulse 91   Temp 98.5 F (36.9 C) (Oral)   Resp 16   SpO2 96%  General:   Alert,  Well-developed, well-nourished, pleasant and cooperative in NAD Neck:  Supple; no masses or thyromegaly. No significant cervical adenopathy. Lungs:  Clear throughout to auscultation.   No wheezes, crackles, or rhonchi. No acute distress. Heart:  Regular rate and rhythm; no murmurs, clicks, rubs,  or gallops. Abdomen: Non-distended, normal bowel sounds.  Soft and nontender without appreciable mass or hepatosplenomegaly.  Pulses:  Normal pulses noted. Extremities:  Without clubbing or edema.  Impression/Plan: 63 year old lady with intermittent diarrhea.  Distant history of colonic polyps.  Here for colonoscopy. The risks, benefits, limitations, alternatives and imponderables have been reviewed with the patient. Questions have been answered. All parties are agreeable.     Notice: This dictation was prepared with Dragon dictation along with smaller phrase technology. Any transcriptional errors that result from this process are unintentional and may not be corrected upon review.

## 2019-09-16 NOTE — Anesthesia Preprocedure Evaluation (Signed)
Anesthesia Evaluation  Patient identified by MRN, date of birth, ID band Patient awake    Reviewed: Allergy & Precautions, NPO status , Patient's Chart, lab work & pertinent test results  Airway Mallampati: III  TM Distance: >3 FB Neck ROM: Full    Dental no notable dental hx. (+) Edentulous Upper, Missing, Poor Dentition   Pulmonary COPD,  COPD inhaler, former smoker,  Hasn't used 02 after recovering from COVID 06/29/2019   Pulmonary exam normal breath sounds clear to auscultation       Cardiovascular Exercise Tolerance: Good negative cardio ROS Normal cardiovascular examI Rhythm:Regular Rate:Normal     Neuro/Psych Depression negative neurological ROS  negative psych ROS   GI/Hepatic negative GI ROS, Neg liver ROS,   Endo/Other  diabetes, Type 2, Oral Hypoglycemic AgentsHypothyroidism   Renal/GU negative Renal ROS  negative genitourinary   Musculoskeletal  (+) Arthritis , Rheumatoid disorders,    Abdominal   Peds negative pediatric ROS (+)  Hematology negative hematology ROS (+) anemia ,   Anesthesia Other Findings   Reproductive/Obstetrics negative OB ROS                             Anesthesia Physical Anesthesia Plan  ASA: III  Anesthesia Plan: General   Post-op Pain Management:    Induction: Intravenous  PONV Risk Score and Plan: 3 and Propofol infusion, TIVA, Treatment may vary due to age or medical condition and Ondansetron  Airway Management Planned: Nasal Cannula and Simple Face Mask  Additional Equipment:   Intra-op Plan:   Post-operative Plan:   Informed Consent: I have reviewed the patients History and Physical, chart, labs and discussed the procedure including the risks, benefits and alternatives for the proposed anesthesia with the patient or authorized representative who has indicated his/her understanding and acceptance.     Dental advisory given  Plan  Discussed with: CRNA  Anesthesia Plan Comments: (Plan Full PPE use  Plan GA with GETA as needed d/w pt -WTP with same after Q&A)        Anesthesia Quick Evaluation

## 2019-09-16 NOTE — Transfer of Care (Signed)
Immediate Anesthesia Transfer of Care Note  Patient: Erica Beasley  Procedure(s) Performed: COLONOSCOPY WITH PROPOFOL (N/A ) BIOPSY  Patient Location: PACU  Anesthesia Type:General  Level of Consciousness: awake  Airway & Oxygen Therapy: Patient Spontanous Breathing  Post-op Assessment: Report given to RN  Post vital signs: Reviewed  Last Vitals:  Vitals Value Taken Time  BP 103/60 09/16/19 1053  Temp    Pulse 78 09/16/19 1055  Resp 24 09/16/19 1055  SpO2 97 % 09/16/19 1055  Vitals shown include unvalidated device data.  Last Pain:  Vitals:   09/16/19 0930  TempSrc: Oral  PainSc: 0-No pain      Patients Stated Pain Goal: 10 ([id number])  Complications: No apparent anesthesia complications

## 2019-09-17 ENCOUNTER — Encounter: Payer: Self-pay | Admitting: Internal Medicine

## 2019-09-17 LAB — SURGICAL PATHOLOGY

## 2019-09-24 ENCOUNTER — Telehealth: Payer: Self-pay | Admitting: Internal Medicine

## 2019-09-24 NOTE — Telephone Encounter (Signed)
Pt says the diarrhea is soft, mushy and looks greasy. Stool likes like food is within it. Pt states the stool isn't watery.

## 2019-09-24 NOTE — Telephone Encounter (Signed)
PLEASE CALL PATIENT, [phone] PLEASE CALL PATIENT, SHE SAID SHE IS STILL HAVING DIARRHEA SINCE HER PROCEDURE

## 2019-09-24 NOTE — Telephone Encounter (Signed)
Please address further questions with on-call person for hospital; however, we need to check Cdiff as she was exposed to antibiotics.

## 2019-09-24 NOTE — Telephone Encounter (Signed)
Noted. Routing to [facility]. Pt isn't having watery diarrhea.

## 2019-09-24 NOTE — Telephone Encounter (Signed)
Spoke with pt. Pt's TCS was on 09/16/2019, pt has a history of chronic diarrhea. Pt states that she is still having diarrhea s/p her TCS. Pt isn't having watery diarrhea but it looks greasy to pt. Pt hasn't changed any foods that she is eating and doesn't have much of an appetite. Pt is having 7-9 bowel movements a day and isn't taking any medications to control the diarrhea. Pt reports no nausea, no vomiting ad no abdominal pain. Last antibiotics that pt was on was 07-14-19 - 07-22-2019 when pt was hospitalized with Covid. Pt hears loud gas sounds in her stomach before she has a bowel movement. Please advise in the absence of LSL.

## 2019-09-24 NOTE — Telephone Encounter (Signed)
Is her stool consistency similar to what it was when she had C. Diff? What is the consistency? When did the frequent diarrhea start again? Seemed she was doing better at her office visit in August. Colonoscopy a few days ago with benign biopsies.

## 2019-09-25 ENCOUNTER — Other Ambulatory Visit: Payer: Self-pay

## 2019-09-25 DIAGNOSIS — R197 Diarrhea, unspecified: Secondary | ICD-10-CM

## 2019-09-25 NOTE — Telephone Encounter (Signed)
Spoke with patient this morning.  She reports on average, 6-7 BMs a day.  Sometimes more.  Feels her diarrhea is worsening.  Has been present at least since October.  States "there is nothing formed about my stools."  Stools appear greasy and jelly.  BMs associated with urgency.  States this is similar to stool consistency when she had C. difficile in the past.  Also reports rectal irritation and intermittent bright red blood per rectum which I suspect is likely secondary to known internal hemorrhoids noted on recent colonoscopy.  We need to go ahead and have her complete stool studies to evaluate for C. Difficile.  She is also advised to use Preparation H twice a day to help with rectal irritation/bleeding.  Elmo Putt, please arrange for patient to complete stool studies for C. Difficile.

## 2019-09-25 NOTE — Telephone Encounter (Signed)
Noted. Spoke with pt, lab orders have been placed for [facility] per pts request.

## 2019-09-27 LAB — C. DIFFICILE GDH AND TOXIN A/B
GDH ANTIGEN: NOT DETECTED
MICRO NUMBER:: 10000151
SPECIMEN QUALITY:: ADEQUATE
TOXIN A AND B: NOT DETECTED

## 2019-09-30 MED ORDER — ACTEMRA ACTPEN 162 MG/0.9 ML SUBCUTANEOUS PEN INJECTOR
SUBCUTANEOUS | 3 refills | 0 days
Start: 2019-09-30 — End: ?

## 2019-10-02 NOTE — Unmapped (Signed)
[facility] Pharm[facility] Refill Coordination Note    Specialty Medication(s) to be Shipped:   Inflammatory Disorders: Actemra    Other medication(s) to be shipped: n/a     Mart Piggs, DOB: 12/29/1955  Phone: [phone] (home)       All above HIPAA information was verified with patient.     Was a Nurse, learning disability used for this call? No    Completed refill call assessment today to schedule patient's medication shipment from the [facility] [phone]).       Specialty medication(s) and dose(s) confirmed: Regimen is correct and unchanged.   Changes to medications: Dameisha reports no changes at this time.  Changes to insurance: No  Questions for the pharmacist: No    Confirmed patient received Welcome Packet with first shipment. The patient will receive a drug information handout for each medication shipped and additional FDA Medication Guides as required.       DISEASE/MEDICATION-SPECIFIC INFORMATION        For patients on injectable medications: Patient currently has 0 doses left.  Next injection is scheduled for 10/05/19.    SPECIALTY MEDICATION ADHERENCE     Medication Adherence    Patient reported X missed doses in the last month: 0  Specialty Medication: Actemra 162mg /0.76ml  Informant: patient                SHIPPING     Shipping address confirmed in Epic.     Delivery Scheduled: Yes, Expected medication delivery date: 10/04/19.     Medication will be delivered via UPS to the prescription address in Epic WAM.    [name]   [facility] Specialty Technician

## 2019-10-03 MED FILL — ACTEMRA ACTPEN 162 MG/0.9 ML SUBCUTANEOUS PEN INJECTOR: SUBCUTANEOUS | 28 days supply | Qty: 3.6 | Fill #0

## 2019-10-03 MED FILL — ACTEMRA ACTPEN 162 MG/0.9 ML SUBCUTANEOUS PEN INJECTOR: 28 days supply | Qty: 4 | Fill #0 | Status: AC

## 2019-10-07 ENCOUNTER — Telehealth: Payer: Self-pay | Admitting: Internal Medicine

## 2019-10-07 NOTE — Telephone Encounter (Signed)
Spoke with pt. Pt's last TCS was 12/21 with normal biopsies, last Stool test 09/26/2019 negative. Pt called with c/o diarrhea. Pt isn't taking anything for the diarrhea and states that has a soft greasy appearance. Pt reports no abdominal pain and no n/v. Pt has woken up ever hour on the hour at during the night for the last 3 days. Pt states that she has 7 or more bowel movements a day. Pt has a bowel movement after she eats or drinks anything and says she does eat much throughout the day. Pt says she's not eating anything that should cause diarrhea.

## 2019-10-07 NOTE — Telephone Encounter (Signed)
[phone]  Please call patient, she has diarrhea and says it is getting worse

## 2019-10-08 ENCOUNTER — Other Ambulatory Visit: Payer: Self-pay

## 2019-10-08 MED ORDER — PANCRELIPASE (LIP-PROT-AMYL) 36000-114000 UNITS PO CPEP: ORAL_CAPSULE | ORAL | 1 refills | Status: DC

## 2019-10-08 NOTE — Telephone Encounter (Signed)
Noted. Spoke with pt. Pt notified of LSL's recommendations. RX sent to pts pharmacy. Pt is aware that this RX could be expensive and pt assistance may be needed. Pt will call back if needed.

## 2019-10-08 NOTE — Telephone Encounter (Signed)
I saw her phone note. Let's try pancreatic enzymes. Start with creon 36000, two with meals and one with snacks. #240 with 1 refills. She has to take at onset of eating.   May give samples if any available.  Make sure she has follow up appointment.

## 2019-10-25 ENCOUNTER — Telehealth: Payer: Self-pay

## 2019-10-25 NOTE — Telephone Encounter (Signed)
PA for Creon has been submitted through [email or web address]. waiting on an approval or denial.

## 2019-11-07 NOTE — Unmapped (Signed)
[facility] Pharm[facility] Refill Coordination Note    Specialty Medication(s) to be Shipped:   Inflammatory Disorders: Actemra    Other medication(s) to be shipped:      Kristin Mccormick, DOB: 04-04-1956  Phone: [phone] (home)       All above HIPAA information was verified with patient.     Was a Nurse, learning disability used for this call? No    Completed refill call assessment today to schedule patient's medication shipment from the [facility] [phone]).       Specialty medication(s) and dose(s) confirmed: Regimen is correct and unchanged.   Changes to medications: Kristin Mccormick reports no changes at this time.  Changes to insurance: No  Questions for the pharmacist: No    Confirmed patient received Welcome Packet with first shipment. The patient will receive a drug information handout for each medication shipped and additional FDA Medication Guides as required.       DISEASE/MEDICATION-SPECIFIC INFORMATION        For patients on injectable medications: Patient currently has 1 doses left.  Next injection is scheduled for 2/13.    SPECIALTY MEDICATION ADHERENCE     Medication Adherence    Patient reported X missed doses in the last month: 0  Specialty Medication: Actemra 162mg /0.63ml  Patient is on additional specialty medications: No  Informant: patient  Confirmed plan for next specialty medication refill: delivery by pharmacy  Refills needed for supportive medications: not needed          Refill Coordination    Has the Patients' Contact Information Changed: No  Is the Shipping Address Different: No           ACTEMRA 162/0.9 mg/ml: 9 days of medicine on hand         SHIPPING     Shipping address confirmed in Epic.     Delivery Scheduled: Yes, Expected medication delivery date: 2/16.     Medication will be delivered via UPS to the prescription address in Epic WAM.    Jolene Schimke   [facility] Specialty Technician

## 2019-11-11 MED FILL — ACTEMRA ACTPEN 162 MG/0.9 ML SUBCUTANEOUS PEN INJECTOR: SUBCUTANEOUS | 28 days supply | Qty: 3.6 | Fill #1

## 2019-11-11 MED FILL — ACTEMRA ACTPEN 162 MG/0.9 ML SUBCUTANEOUS PEN INJECTOR: 28 days supply | Qty: 4 | Fill #1 | Status: AC

## 2019-11-11 NOTE — Telephone Encounter (Signed)
Creon was approved through pts insurance. 10-22-19 through 10-24-20.

## 2019-12-05 NOTE — Unmapped (Signed)
This patient has been disenrolled from the [facility] specialty pharmacy services due to patient reporting she no longer has insurance.  OE is submitting her referral to see if any assistance is available.Kristin Mccormick  [facility] Specialty Pharmacist

## 2019-12-16 ENCOUNTER — Ambulatory Visit: Payer: Commercial Managed Care - PPO | Admitting: Gastroenterology

## 2020-01-08 DIAGNOSIS — M0579 Rheumatoid arthritis with rheumatoid factor of multiple sites without organ or systems involvement: Principal | ICD-10-CM

## 2020-03-04 ENCOUNTER — Other Ambulatory Visit: Payer: Self-pay | Admitting: Nephrology

## 2020-03-04 ENCOUNTER — Other Ambulatory Visit (HOSPITAL_COMMUNITY): Payer: Self-pay | Admitting: Nephrology

## 2020-03-04 DIAGNOSIS — E559 Vitamin D deficiency, unspecified: Secondary | ICD-10-CM

## 2020-03-04 DIAGNOSIS — N170 Acute kidney failure with tubular necrosis: Secondary | ICD-10-CM

## 2020-03-04 DIAGNOSIS — E871 Hypo-osmolality and hyponatremia: Secondary | ICD-10-CM

## 2020-03-04 DIAGNOSIS — I129 Hypertensive chronic kidney disease with stage 1 through stage 4 chronic kidney disease, or unspecified chronic kidney disease: Secondary | ICD-10-CM

## 2020-03-04 DIAGNOSIS — D638 Anemia in other chronic diseases classified elsewhere: Secondary | ICD-10-CM

## 2020-03-10 ENCOUNTER — Ambulatory Visit (HOSPITAL_COMMUNITY)
Admission: RE | Admit: 2020-03-10 | Discharge: 2020-03-10 | Disposition: A | Discharge status: Home / Self Care | Payer: Medicaid Other | Source: Ambulatory Visit | Attending: Nephrology | Admitting: Nephrology

## 2020-03-10 ENCOUNTER — Other Ambulatory Visit (HOSPITAL_COMMUNITY)
Admission: RE | Admit: 2020-03-10 | Discharge: 2020-03-10 | Disposition: A | Discharge status: Home / Self Care | Payer: Medicaid Other | Source: Ambulatory Visit | Attending: Nephrology | Admitting: Nephrology

## 2020-03-10 ENCOUNTER — Encounter (HOSPITAL_COMMUNITY): Payer: Self-pay

## 2020-03-10 ENCOUNTER — Ambulatory Visit (HOSPITAL_COMMUNITY): Admission: RE | Admit: 2020-03-10 | Payer: Self-pay | Source: Ambulatory Visit

## 2020-03-10 ENCOUNTER — Other Ambulatory Visit: Payer: Self-pay

## 2020-03-10 DIAGNOSIS — I129 Hypertensive chronic kidney disease with stage 1 through stage 4 chronic kidney disease, or unspecified chronic kidney disease: Secondary | ICD-10-CM | POA: Diagnosis present

## 2020-03-10 DIAGNOSIS — D638 Anemia in other chronic diseases classified elsewhere: Secondary | ICD-10-CM | POA: Diagnosis present

## 2020-03-10 DIAGNOSIS — N170 Acute kidney failure with tubular necrosis: Secondary | ICD-10-CM | POA: Diagnosis present

## 2020-03-10 DIAGNOSIS — E871 Hypo-osmolality and hyponatremia: Secondary | ICD-10-CM | POA: Diagnosis present

## 2020-03-10 DIAGNOSIS — E559 Vitamin D deficiency, unspecified: Secondary | ICD-10-CM | POA: Insufficient documentation

## 2020-03-10 LAB — CBC WITH DIFFERENTIAL/PLATELET
Abs Immature Granulocytes: 0.02 10*3/uL (ref 0.00–0.07)
Basophils Absolute: 0 10*3/uL (ref 0.0–0.1)
Basophils Relative: 0 %
Eosinophils Absolute: 0.1 10*3/uL (ref 0.0–0.5)
Eosinophils Relative: 1 %
HCT: 33.5 % — ABNORMAL LOW (ref 36.0–46.0)
Hemoglobin: 10.2 g/dL — ABNORMAL LOW (ref 12.0–15.0)
Immature Granulocytes: 0 %
Lymphocytes Relative: 21 %
Lymphs Abs: 1.4 10*3/uL (ref 0.7–4.0)
MCH: 28.7 pg (ref 26.0–34.0)
MCHC: 30.4 g/dL (ref 30.0–36.0)
MCV: 94.1 fL (ref 80.0–100.0)
Monocytes Absolute: 0.7 10*3/uL (ref 0.1–1.0)
Monocytes Relative: 10 %
Neutro Abs: 4.3 10*3/uL (ref 1.7–7.7)
Neutrophils Relative %: 68 %
Platelets: 369 10*3/uL (ref 150–400)
RBC: 3.56 MIL/uL — ABNORMAL LOW (ref 3.87–5.11)
RDW: 13.4 % (ref 11.5–15.5)
WBC: 6.5 10*3/uL (ref 4.0–10.5)
nRBC: 0 % (ref 0.0–0.2)

## 2020-03-10 LAB — COMPREHENSIVE METABOLIC PANEL
ALT: 18 U/L (ref 0–44)
AST: 35 U/L (ref 15–41)
Albumin: 3.1 g/dL — ABNORMAL LOW (ref 3.5–5.0)
Alkaline Phosphatase: 128 U/L — ABNORMAL HIGH (ref 38–126)
Anion gap: 12 (ref 5–15)
BUN: 8 mg/dL (ref 8–23)
CO2: 26 mmol/L (ref 22–32)
Calcium: 8.7 mg/dL — ABNORMAL LOW (ref 8.9–10.3)
Chloride: 99 mmol/L (ref 98–111)
Creatinine, Ser: 1.09 mg/dL — ABNORMAL HIGH (ref 0.44–1.00)
GFR calc Af Amer: >60 mL/min (ref >60)
GFR calc non Af Amer: 54 mL/min — ABNORMAL LOW (ref >60)
Glucose, Bld: 157 mg/dL — ABNORMAL HIGH (ref 70–99)
Potassium: 4.1 mmol/L (ref 3.5–5.1)
Sodium: 137 mmol/L (ref 135–145)
Total Bilirubin: 0.4 mg/dL (ref 0.3–1.2)
Total Protein: 7.3 g/dL (ref 6.5–8.1)

## 2020-03-10 LAB — IRON AND TIBC
Iron: 34 ug/dL (ref 28–170)
Saturation Ratios: 15 % (ref 10.4–31.8)
TIBC: 231 ug/dL — ABNORMAL LOW (ref 250–450)
UIBC: 197 ug/dL

## 2020-03-10 LAB — PROTEIN, URINE, 24 HOUR
Collection Interval-UPROT: 24 hours
Protein, 24H Urine: 77 mg/d (ref 50–100)
Protein, Urine: 7 mg/dL
Urine Total Volume-UPROT: 1100 mL

## 2020-03-10 LAB — CREATININE, SERUM
Creatinine, Ser: 1.11 mg/dL — ABNORMAL HIGH (ref 0.44–1.00)
GFR calc Af Amer: >60 mL/min (ref >60)
GFR calc non Af Amer: 53 mL/min — ABNORMAL LOW (ref >60)

## 2020-03-10 LAB — MAGNESIUM: Magnesium: 1.9 mg/dL (ref 1.7–2.4)

## 2020-03-10 LAB — CREATININE CLEARANCE, URINE, 24 HOUR
Collection Interval-CRCL: 24 hours
Creatinine Clearance: 60 mL/min — ABNORMAL LOW (ref 75–115)
Creatinine, 24H Ur: 964 mg/d (ref 600–1800)
Creatinine, Urine: 87.64 mg/dL
Urine Total Volume-CRCL: 1100 mL

## 2020-03-10 LAB — PROTEIN / CREATININE RATIO, URINE
Creatinine, Urine: 91.49 mg/dL
Protein Creatinine Ratio: 0.07 mg/mg{Cre} (ref 0.00–0.15)
Total Protein, Urine: 6 mg/dL

## 2020-03-10 LAB — HEPATITIS B SURFACE ANTIBODY,QUALITATIVE: Hep B S Ab: REACTIVE — AB

## 2020-03-10 LAB — HEMOGLOBIN A1C
Hgb A1c MFr Bld: 7 % — ABNORMAL HIGH (ref 4.8–5.6)
Mean Plasma Glucose: 154.2 mg/dL

## 2020-03-10 LAB — VITAMIN B12: Vitamin B-12: 189 pg/mL (ref 180–914)

## 2020-03-10 LAB — VITAMIN D 25 HYDROXY (VIT D DEFICIENCY, FRACTURES): Vit D, 25-Hydroxy: 58.73 ng/mL (ref 30–100)

## 2020-03-10 LAB — HEPATITIS C ANTIBODY: HCV Ab: NONREACTIVE

## 2020-03-10 LAB — HEPATITIS B SURFACE ANTIGEN: Hepatitis B Surface Ag: NONREACTIVE

## 2020-03-10 LAB — FERRITIN: Ferritin: 527 ng/mL — ABNORMAL HIGH (ref 11–307)

## 2020-03-10 LAB — PHOSPHORUS: Phosphorus: 4.1 mg/dL (ref 2.5–4.6)

## 2020-03-10 IMAGING — US US RENAL
1 series · 14 of 25 positions shown · non-contrast
Comparison: None.

CLINICAL DATA: Acute renal failure

EXAM:
RENAL / URINARY TRACT ULTRASOUND COMPLETE

[Series 1: us renal · 14 of 35 slices shown]
[im 1/35]
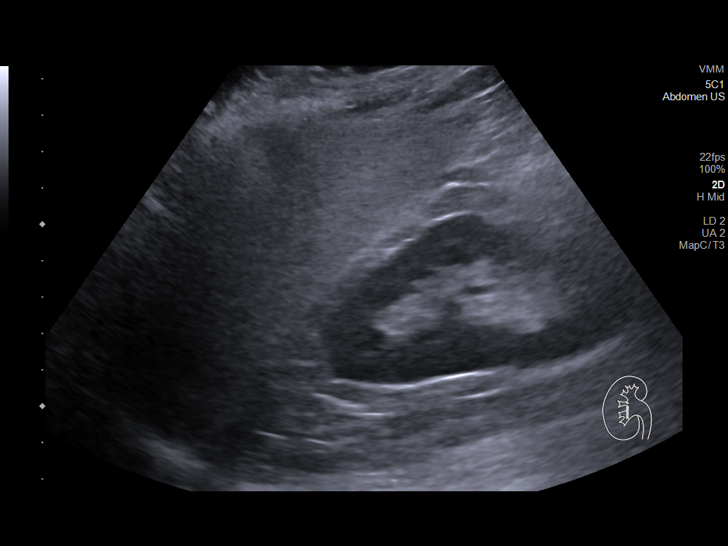
[im 3/35]
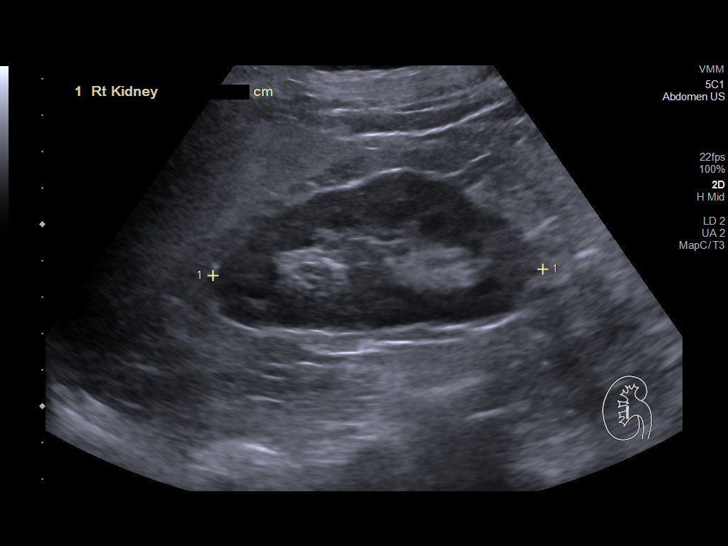
[im 6/35]
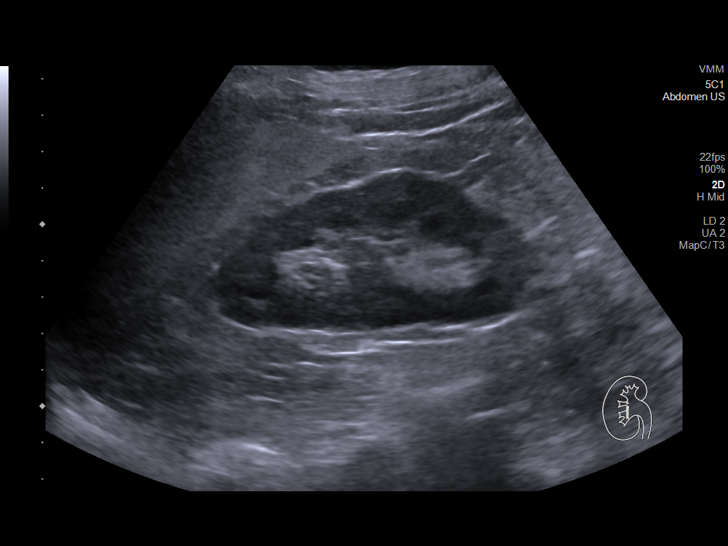
[im 9/35]
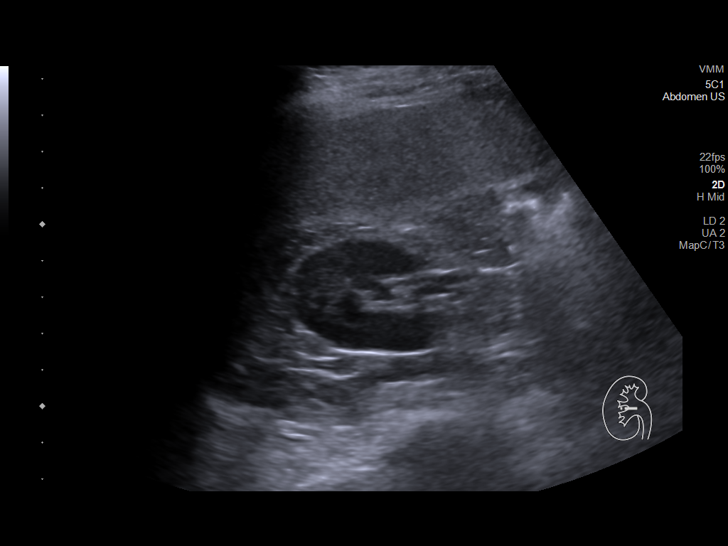
[im 12/35]
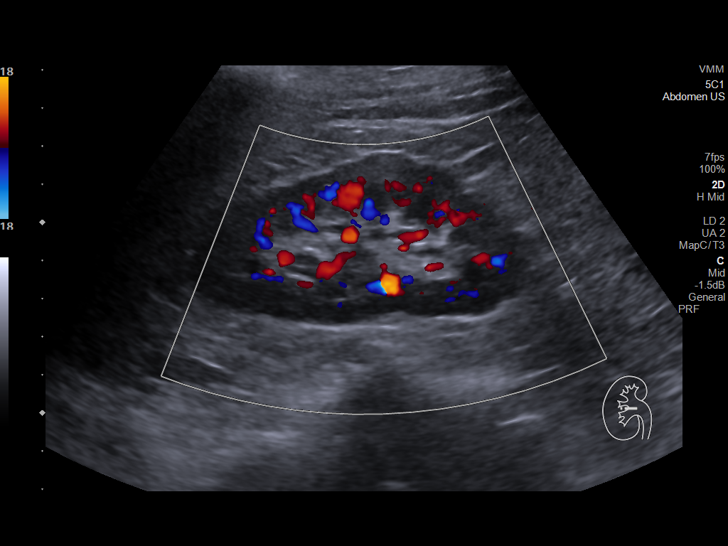
[im 13/35]
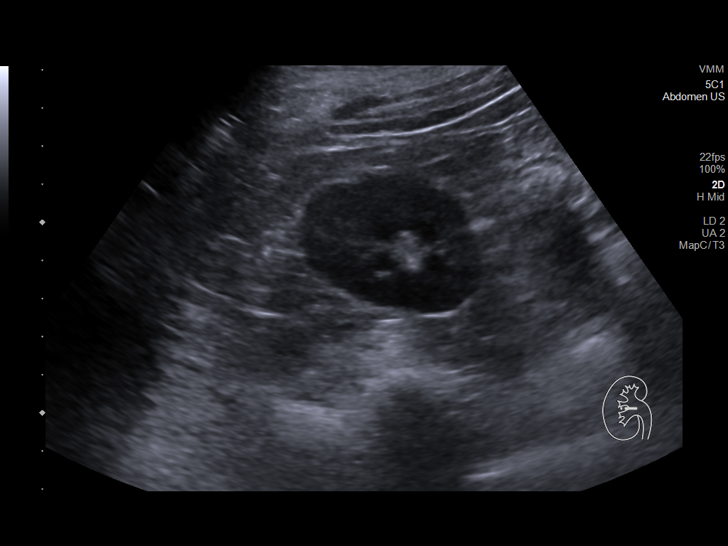
[im 16/35]
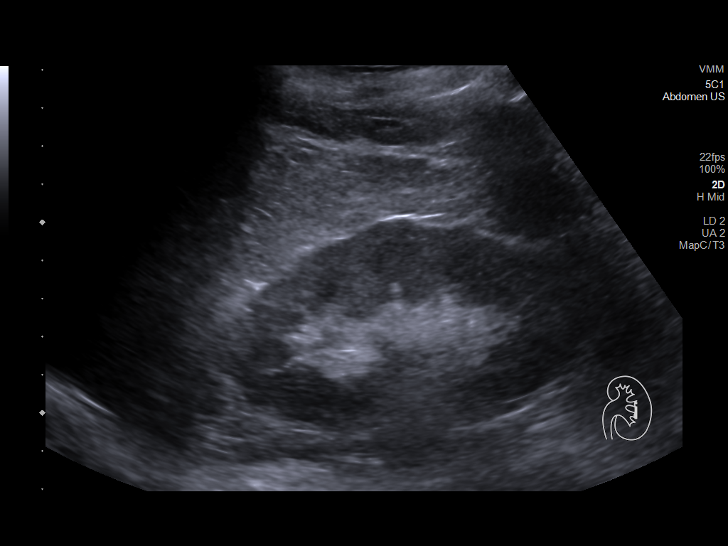
[im 19/35]
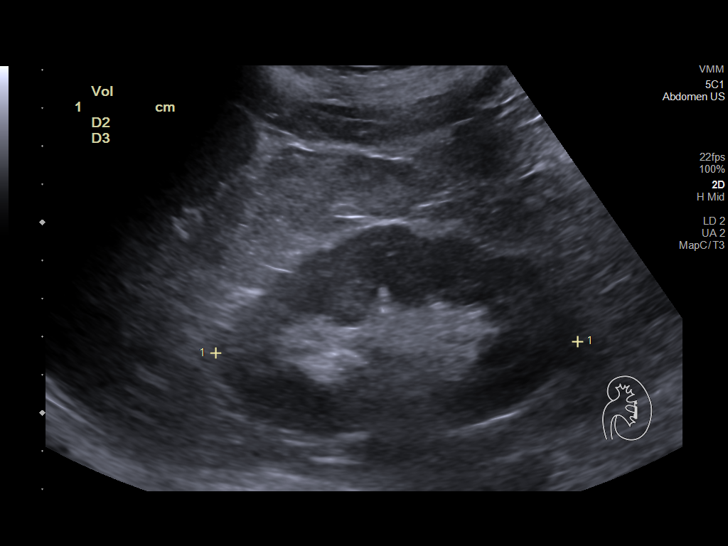
[im 22/35]
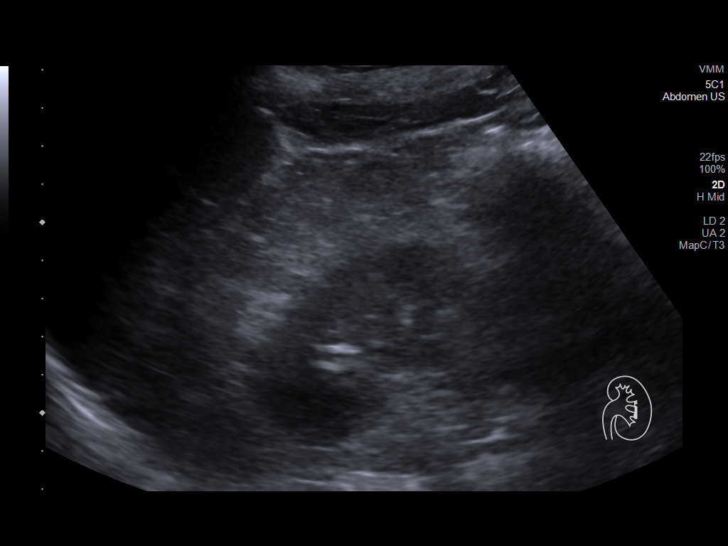
[im 23/35]
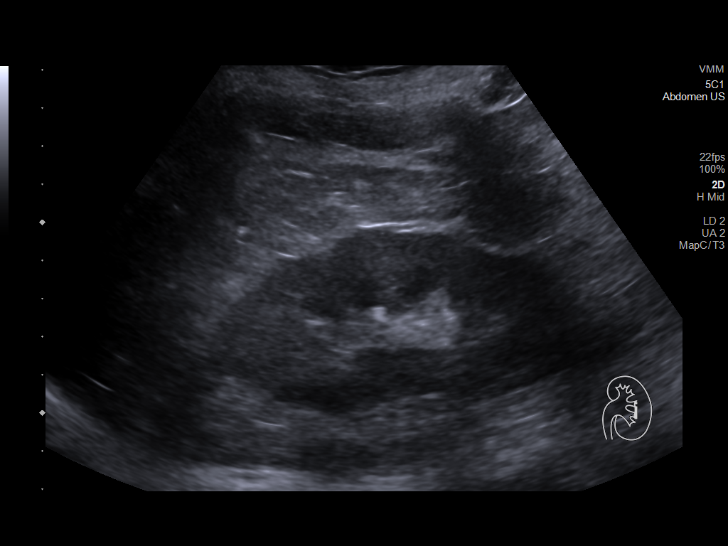
[im 26/35]
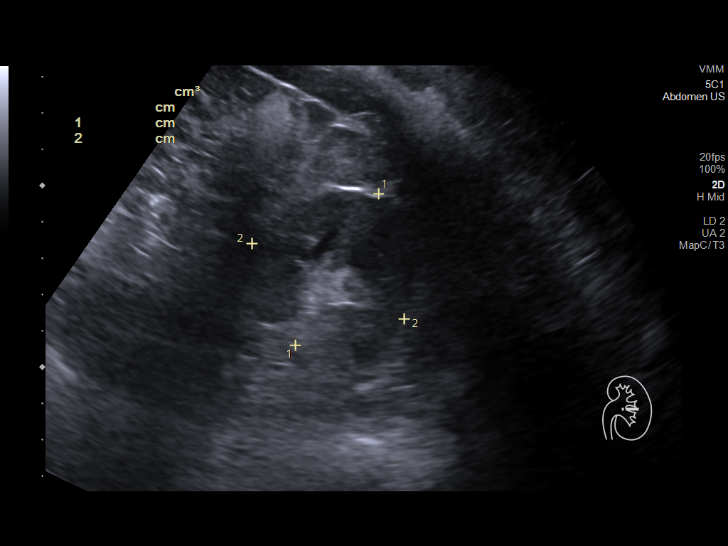
[im 29/35]
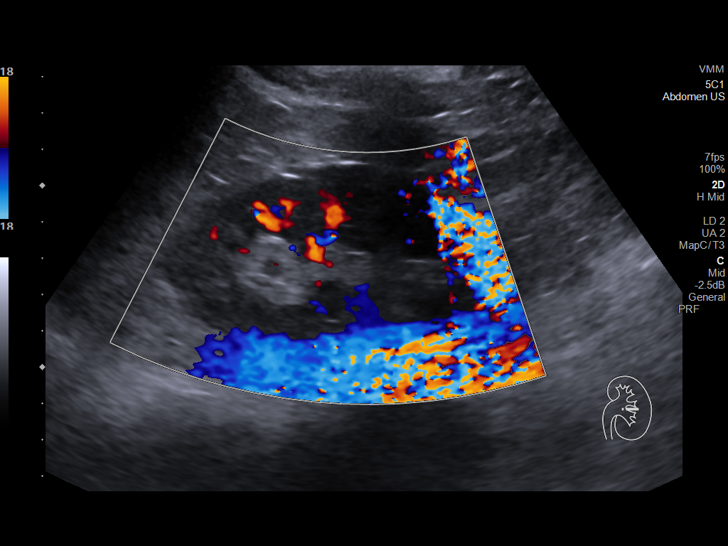
[im 32/35]
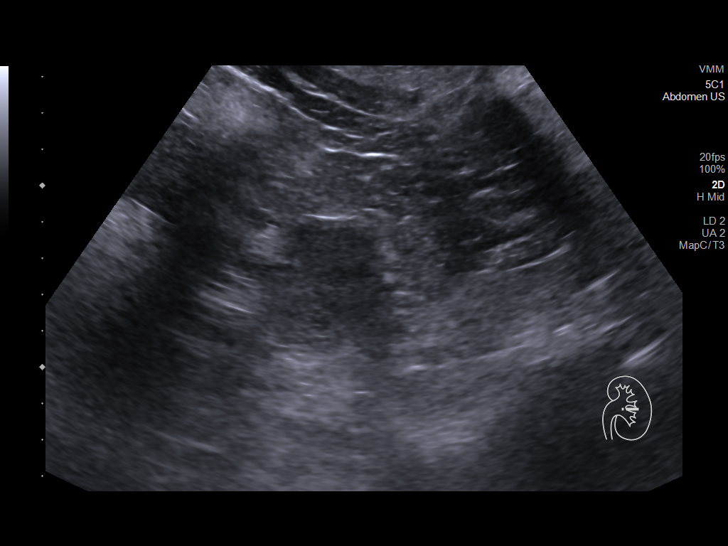
[im 35/35]
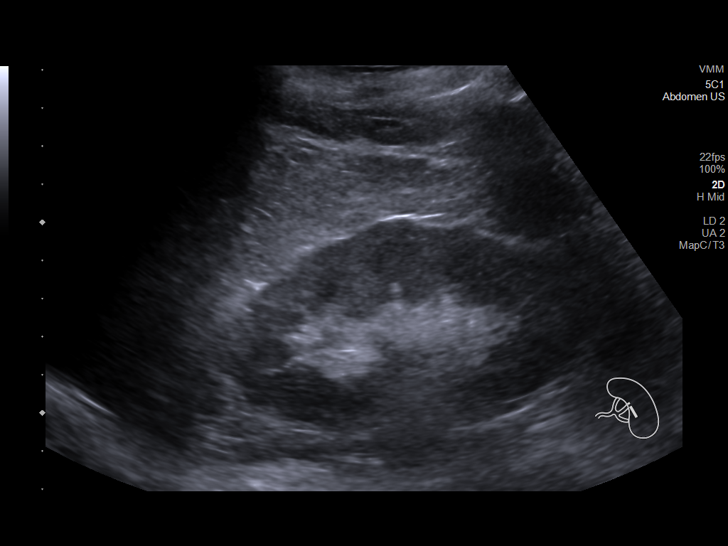

[14 of 25 positions shown; findings below may reference images not displayed]

FINDINGS: Right Kidney:

Renal measurements: 9.1 x 4.2 x 4.7 cm = volume: 96 mL .
Echogenicity within normal limits. No mass or hydronephrosis
visualized.

Left Kidney:

Renal measurements: 9.5 x 4.7 x 4.6 cm = volume: 110 mL.
Echogenicity within normal limits. No mass or hydronephrosis
visualized.

Bladder:

Urinary bladder is empty.

Other:

The liver appears slightly echogenic.
IMPRESSION: 1. Normal ultrasound appearance of the kidneys
2. The liver appears echogenic suggesting steatosis.

## 2020-03-11 LAB — C4 COMPLEMENT: Complement C4, Body Fluid: 22 mg/dL (ref 12–38)

## 2020-03-11 LAB — KAPPA/LAMBDA LIGHT CHAINS
Kappa free light chain: 52.9 mg/L — ABNORMAL HIGH (ref 3.3–19.4)
Kappa, lambda light chain ratio: 1.78 — ABNORMAL HIGH (ref 0.26–1.65)
Lambda free light chains: 29.8 mg/L — ABNORMAL HIGH (ref 5.7–26.3)

## 2020-03-11 LAB — PROTEIN ELECTROPHORESIS, SERUM
A/G Ratio: 0.9 (ref 0.7–1.7)
Albumin ELP: 3 g/dL (ref 2.9–4.4)
Alpha-1-Globulin: 0.4 g/dL (ref 0.0–0.4)
Alpha-2-Globulin: 0.9 g/dL (ref 0.4–1.0)
Beta Globulin: 1 g/dL (ref 0.7–1.3)
Gamma Globulin: 1.1 g/dL (ref 0.4–1.8)
Globulin, Total: 3.4 g/dL (ref 2.2–3.9)
Total Protein ELP: 6.4 g/dL (ref 6.0–8.5)

## 2020-03-11 LAB — GLOMERULAR BASEMENT MEMBRANE ANTIBODIES: GBM Ab: 4 units (ref 0–20)

## 2020-03-11 LAB — PTH, INTACT AND CALCIUM
Calcium, Total (PTH): 9.1 mg/dL (ref 8.7–10.3)
PTH: 31 pg/mL (ref 15–65)

## 2020-03-11 LAB — C3 COMPLEMENT: C3 Complement: 148 mg/dL (ref 82–167)

## 2020-03-12 LAB — MISC LABCORP TEST (SEND OUT): Labcorp test code: 141330

## 2020-03-12 LAB — IMMUNOFIXATION, URINE

## 2020-03-12 LAB — HIV-1/2 AB - DIFFERENTIATION
HIV 1 Ab: NEGATIVE
HIV 2 Ab: NEGATIVE
Note: NEGATIVE

## 2020-03-12 LAB — ANCA TITERS
Atypical P-ANCA titer: <1:20 {titer}
C-ANCA: <1:20 {titer}
P-ANCA: 1:80 {titer} — ABNORMAL HIGH

## 2020-03-12 LAB — RNA QUALITATIVE: HIV 1 RNA Qualitative: 1

## 2020-03-12 LAB — ANA: Anti Nuclear Antibody (ANA): NEGATIVE

## 2020-03-13 ENCOUNTER — Other Ambulatory Visit (HOSPITAL_COMMUNITY)
Admission: RE | Admit: 2020-03-13 | Discharge: 2020-03-13 | Disposition: A | Discharge status: Home / Self Care | Payer: Medicaid Other | Source: Ambulatory Visit | Attending: Nephrology | Admitting: Nephrology

## 2020-03-13 DIAGNOSIS — E559 Vitamin D deficiency, unspecified: Secondary | ICD-10-CM | POA: Insufficient documentation

## 2020-03-13 DIAGNOSIS — E638 Other specified nutritional deficiencies: Secondary | ICD-10-CM | POA: Insufficient documentation

## 2020-03-13 DIAGNOSIS — E871 Hypo-osmolality and hyponatremia: Secondary | ICD-10-CM | POA: Insufficient documentation

## 2020-03-13 DIAGNOSIS — I129 Hypertensive chronic kidney disease with stage 1 through stage 4 chronic kidney disease, or unspecified chronic kidney disease: Secondary | ICD-10-CM | POA: Insufficient documentation

## 2020-03-13 DIAGNOSIS — N170 Acute kidney failure with tubular necrosis: Secondary | ICD-10-CM | POA: Diagnosis present

## 2020-03-13 LAB — IMMUNOFIXATION ELECTROPHORESIS
IgA: 313 mg/dL (ref 87–352)
IgG (Immunoglobin G), Serum: 1008 mg/dL (ref 586–1602)
IgM (Immunoglobulin M), Srm: 159 mg/dL (ref 26–217)
Total Protein ELP: 6.4 g/dL (ref 6.0–8.5)

## 2020-03-13 LAB — URIC ACID: Uric Acid, Serum: 5.2 mg/dL (ref 2.5–7.1)

## 2020-03-16 LAB — HEPATITIS C GENOTYPE

## 2020-03-20 ENCOUNTER — Encounter (HOSPITAL_COMMUNITY): Payer: Self-pay | Admitting: *Deleted

## 2020-03-20 ENCOUNTER — Emergency Department (HOSPITAL_COMMUNITY)
Admission: EM | Admit: 2020-03-20 | Discharge: 2020-03-20 | Disposition: A | Discharge status: Home / Self Care | Payer: Medicaid Other | Attending: Emergency Medicine | Admitting: Emergency Medicine

## 2020-03-20 ENCOUNTER — Other Ambulatory Visit: Payer: Self-pay

## 2020-03-20 DIAGNOSIS — J449 Chronic obstructive pulmonary disease, unspecified: Secondary | ICD-10-CM | POA: Diagnosis not present

## 2020-03-20 DIAGNOSIS — Z79899 Other long term (current) drug therapy: Secondary | ICD-10-CM | POA: Insufficient documentation

## 2020-03-20 DIAGNOSIS — R197 Diarrhea, unspecified: Secondary | ICD-10-CM | POA: Diagnosis not present

## 2020-03-20 DIAGNOSIS — Z794 Long term (current) use of insulin: Secondary | ICD-10-CM | POA: Insufficient documentation

## 2020-03-20 DIAGNOSIS — Z87891 Personal history of nicotine dependence: Secondary | ICD-10-CM | POA: Diagnosis not present

## 2020-03-20 DIAGNOSIS — E119 Type 2 diabetes mellitus without complications: Secondary | ICD-10-CM | POA: Insufficient documentation

## 2020-03-20 DIAGNOSIS — R5383 Other fatigue: Secondary | ICD-10-CM | POA: Insufficient documentation

## 2020-03-20 DIAGNOSIS — R112 Nausea with vomiting, unspecified: Secondary | ICD-10-CM | POA: Diagnosis not present

## 2020-03-20 DIAGNOSIS — E039 Hypothyroidism, unspecified: Secondary | ICD-10-CM | POA: Diagnosis not present

## 2020-03-20 LAB — COMPREHENSIVE METABOLIC PANEL
ALT: 12 U/L (ref 0–44)
AST: 25 U/L (ref 15–41)
Albumin: 3.1 g/dL — ABNORMAL LOW (ref 3.5–5.0)
Alkaline Phosphatase: 130 U/L — ABNORMAL HIGH (ref 38–126)
Anion gap: 12 (ref 5–15)
BUN: 8 mg/dL (ref 8–23)
CO2: 25 mmol/L (ref 22–32)
Calcium: 8.5 mg/dL — ABNORMAL LOW (ref 8.9–10.3)
Chloride: 99 mmol/L (ref 98–111)
Creatinine, Ser: 1.08 mg/dL — ABNORMAL HIGH (ref 0.44–1.00)
GFR calc Af Amer: >60 mL/min (ref >60)
GFR calc non Af Amer: 55 mL/min — ABNORMAL LOW (ref >60)
Glucose, Bld: 113 mg/dL — ABNORMAL HIGH (ref 70–99)
Potassium: 3.4 mmol/L — ABNORMAL LOW (ref 3.5–5.1)
Sodium: 136 mmol/L (ref 135–145)
Total Bilirubin: 0.3 mg/dL (ref 0.3–1.2)
Total Protein: 7 g/dL (ref 6.5–8.1)

## 2020-03-20 LAB — URINALYSIS, ROUTINE W REFLEX MICROSCOPIC
Bilirubin Urine: NEGATIVE
Glucose, UA: NEGATIVE mg/dL
Hgb urine dipstick: NEGATIVE
Ketones, ur: NEGATIVE mg/dL
Nitrite: NEGATIVE
Protein, ur: NEGATIVE mg/dL
Specific Gravity, Urine: 1.005 (ref 1.005–1.030)
pH: 5 (ref 5.0–8.0)

## 2020-03-20 LAB — CBC WITH DIFFERENTIAL/PLATELET
Abs Immature Granulocytes: 0.01 10*3/uL (ref 0.00–0.07)
Basophils Absolute: 0 10*3/uL (ref 0.0–0.1)
Basophils Relative: 0 %
Eosinophils Absolute: 0.1 10*3/uL (ref 0.0–0.5)
Eosinophils Relative: 1 %
HCT: 32.6 % — ABNORMAL LOW (ref 36.0–46.0)
Hemoglobin: 9.9 g/dL — ABNORMAL LOW (ref 12.0–15.0)
Immature Granulocytes: 0 %
Lymphocytes Relative: 25 %
Lymphs Abs: 1.8 10*3/uL (ref 0.7–4.0)
MCH: 28.4 pg (ref 26.0–34.0)
MCHC: 30.4 g/dL (ref 30.0–36.0)
MCV: 93.7 fL (ref 80.0–100.0)
Monocytes Absolute: 0.8 10*3/uL (ref 0.1–1.0)
Monocytes Relative: 11 %
Neutro Abs: 4.7 10*3/uL (ref 1.7–7.7)
Neutrophils Relative %: 63 %
Platelets: 396 10*3/uL (ref 150–400)
RBC: 3.48 MIL/uL — ABNORMAL LOW (ref 3.87–5.11)
RDW: 13.3 % (ref 11.5–15.5)
WBC: 7.5 10*3/uL (ref 4.0–10.5)
nRBC: 0 % (ref 0.0–0.2)

## 2020-03-20 MED ORDER — ONDANSETRON 4 MG PO TBDP: 4.0000 mg | ORAL_TABLET | Freq: Three times a day (TID) | ORAL | 0 refills | Status: DC | PRN

## 2020-03-20 MED ORDER — SODIUM CHLORIDE 0.9 % IV BOLUS
1000.0000 mL | Freq: Once | INTRAVENOUS | Status: AC
  Administered 2020-03-20: 1000 mL via INTRAVENOUS

## 2020-03-20 MED ORDER — ONDANSETRON HCL 4 MG/2ML IJ SOLN
4.0000 mg | Freq: Once | INTRAMUSCULAR | Status: AC
  Administered 2020-03-20: 4 mg via INTRAVENOUS
  Filled 2020-03-20: qty 2

## 2020-03-20 NOTE — ED Provider Notes (Signed)
  Face-to-face evaluation   History: She is here for evaluation of decreased ability to eat, with decreased appetite and a sensation of food smelling bad.  She denies fever, chills, cough or dizziness.  Physical exam: Elderly, alert and cooperative.  No dysarthria or aphasia.  She moves all extremities equally.  She is responsive.  Medical screening examination/treatment/procedure(s) were conducted as a shared visit with non-physician practitioner(s) and myself.  I personally evaluated the patient during the encounter    Daleen Bo, MD 03/20/20 2015

## 2020-03-20 NOTE — ED Notes (Signed)
Pt in bed, family at bedside, pt denies pain and nausea at this time.

## 2020-03-20 NOTE — Discharge Instructions (Addendum)
Follow up with your medical providers as scheduled. Recommend bland diet, advance slowly as tolerated.  Try Zofran ODT prior to eating.

## 2020-03-20 NOTE — ED Provider Notes (Signed)
[facility] EMERGENCY DEPARTMENT Provider Note   CSN: [id number] Arrival date & time: 03/20/20  1055     History Chief Complaint  Patient presents with  . Fatigue    Erica Beasley is a 64 y.o. female.  65 yo female with complaint of nausea and vomiting. Had [place] in October, states has not been able to smell or taste her food ever since which is making it difficult for her to eat. Patient also reports foul smelling diarrhea for the past 2 years since having c. Diff. Patient is taking Imodium for her diarrhea, last took Imodium yesterday (no vomiting with this), states she hasn't eaten in 4 days due to vomiting with eating food. Patient is scheduled to see nephrology on Thursday for elevated kidney numbers. Also told her thyroid labs were abnormal, states she is always cold. Has also been to her GI, had extensive lab work up. Patient is monitoring her blood sugar, states it was 104 this morning, not taking her medicine since she is not eating. Last voided at 5am. States she is very hungry but unable to eat.         Past Medical History:  Diagnosis Date  . COPD (chronic obstructive pulmonary disease) ([place])   . Depression   . Diabetes ([place])   . Hyperlipidemia   . Hypothyroidism   . Neuropathy   . Rheumatoid arthritis [facility])     Patient Active Problem List   Diagnosis Date Noted  . Hx of colonic polyps 05/03/2019  . H/O Clostridium difficile infection 05/03/2019  . Normocytic anemia 05/03/2019  . Chronic diarrhea 01/29/2019    Past Surgical History:  Procedure Laterality Date  . BIOPSY  09/16/2019   Procedure: BIOPSY;  Surgeon: Daneil Dolin, MD;  Location: [facility] ENDO SUITE;  Service: Endoscopy;;  . COLONOSCOPY  09/2010   Dr. Posey Pronto: mild diverticulsis in sigmoid colon.   . COLONOSCOPY WITH PROPOFOL N/A 09/16/2019   Procedure: COLONOSCOPY WITH PROPOFOL;  Surgeon: Daneil Dolin, MD;  Location: [facility] ENDO SUITE;  Service: Endoscopy;  Laterality: N/A;  9:00am-moved to 12/21 @  1030am per office  . YAG LASER APPLICATION Left 36/64/4034   Procedure: YAG LASER APPLICATION;  Surgeon: Williams Che, MD;  Location: [facility] ORS;  Service: Ophthalmology;  Laterality: Left;     OB History   No obstetric history on file.     Family History  Problem Relation Age of Onset  . Stroke Mother   . Heart disease Mother   . Diabetes Mother   . Other Father        stomach taken out for some reason  . Colon cancer Neg Hx     Social History   Tobacco Use  . Smoking status: Former Smoker    Packs/day: 1.00    Years: 15.00    Pack years: 15.00    Types: Cigarettes    Quit date: 09/11/2017    Years since quitting: 2.5  . Smokeless tobacco: Former Network engineer  . Vaping Use: Never used  Substance Use Topics  . Alcohol use: Never  . Drug use: Never    Home Medications Prior to Admission medications   Medication Sig Start Date End Date Taking? Authorizing Provider  acetaminophen (TYLENOL) 325 MG tablet Take 650 mg by mouth every 6 (six) hours as needed for moderate pain or headache.   Yes [provider]  Cholecalciferol (VITAMIN D3) 125 MCG (5000 UT) TABS Take 1 tablet by mouth daily. 03/09/20  Yes [provider]  citalopram (CELEXA) 20 MG tablet Take 20 mg by mouth daily.   Yes [provider]  gabapentin (NEURONTIN) 300 MG capsule Take 300 mg by mouth at bedtime.   Yes [provider]  glipiZIDE (GLUCOTROL XL) 10 MG 24 hr tablet Take 10 mg by mouth 2 (two) times daily. 12/19/19  Yes [provider]  HEARTBURN RELIEF MAX ST 20 MG tablet Take 1 tablet by mouth 2 (two) times daily. 03/12/20  Yes [provider]  hydroxychloroquine (PLAQUENIL) 200 MG tablet Take 200 mg by mouth 2 (two) times daily.    Yes [provider]  insulin lispro (HUMALOG) 100 UNIT/ML injection Inject 2-14 Units into the skin 3 (three) times daily before meals.   Yes [provider]  leflunomide (ARAVA) 20 MG tablet Take 20  mg by mouth daily.   Yes [provider]  levothyroxine (SYNTHROID, LEVOTHROID) 112 MCG tablet Take 112 mcg by mouth daily before breakfast.   Yes [provider]  lipase/protease/amylase (CREON) 36000 UNITS CPEP capsule Take 2 capsules with meals and 1 with snacks 10/08/19  Yes Mahala Menghini, PA-C  lisinopril-hydrochlorothiazide (ZESTORETIC) 20-12.5 MG tablet Take 1 tablet by mouth daily. 03/09/20  Yes [provider]  potassium chloride (MICRO-K) 10 MEQ CR capsule Take 10 mEq by mouth daily. 12/19/19  Yes [provider]  pravastatin (PRAVACHOL) 40 MG tablet Take 40 mg by mouth at bedtime.    Yes [provider]  Probiotic CAPS Take 1 capsule by mouth daily.   Yes [provider]  raloxifene (EVISTA) 60 MG tablet Take 60 mg by mouth daily.   Yes [provider]  ondansetron (ZOFRAN ODT) 4 MG disintegrating tablet Take 1 tablet (4 mg total) by mouth every 8 (eight) hours as needed for nausea or vomiting. 03/20/20   Tacy Learn, PA-C  Tocilizumab (ACTEMRA ACTPEN) 162 MG/0.9ML SOAJ Inject 162 mg as directed every Saturday. 05/01/19   [provider]    Allergies    Patient has no known allergies.  Review of Systems   Review of Systems  Constitutional: Positive for appetite change. Negative for fever.  Respiratory: Negative for shortness of breath.   Cardiovascular: Negative for chest pain.  Gastrointestinal: Positive for diarrhea, nausea and vomiting. Negative for abdominal pain and blood in stool.  Genitourinary: Negative for decreased urine volume and dysuria.  Musculoskeletal: Negative for back pain and myalgias.  Skin: Negative for rash and wound.  Allergic/Immunologic: Positive for immunocompromised state.  All other systems reviewed and are negative.   Physical Exam Updated Vital Signs BP 138/87   Pulse 85   Temp 98.4 F (36.9 C) (Oral)   Resp 14   Ht 5\' 4"  (1.626 m)   Wt 53 kg   SpO2 99%   BMI 20.07  kg/m   Physical Exam Vitals and nursing note reviewed.  Constitutional:      General: She is not in acute distress.    Appearance: She is well-developed. She is not diaphoretic.  HENT:     Head: Normocephalic and atraumatic.     Mouth/Throat:     Mouth: Mucous membranes are moist.  Cardiovascular:     Rate and Rhythm: Normal rate and regular rhythm.     Pulses: Normal pulses.     Heart sounds: Normal heart sounds.  Pulmonary:     Effort: Pulmonary effort is normal.     Breath sounds: Normal breath sounds.  Abdominal:  Palpations: Abdomen is soft.     Tenderness: There is no abdominal tenderness.  Musculoskeletal:     Right lower leg: No edema.     Left lower leg: No edema.  Skin:    General: Skin is warm and dry.     Findings: No erythema or rash.  Neurological:     Mental Status: She is alert and oriented to person, place, and time.  Psychiatric:        Behavior: Behavior normal.     ED Results / Procedures / Treatments   Labs (all labs ordered are listed, but only abnormal results are displayed) Labs Reviewed  COMPREHENSIVE METABOLIC PANEL - Abnormal; Notable for the following components:      Result Value   Potassium 3.4 (*)    Glucose, Bld 113 (*)    Creatinine, Ser 1.08 (*)    Calcium 8.5 (*)    Albumin 3.1 (*)    Alkaline Phosphatase 130 (*)    GFR calc non Af Amer 55 (*)    All other components within normal limits  CBC WITH DIFFERENTIAL/PLATELET - Abnormal; Notable for the following components:   RBC 3.48 (*)    Hemoglobin 9.9 (*)    HCT 32.6 (*)    All other components within normal limits  URINALYSIS, ROUTINE W REFLEX MICROSCOPIC - Abnormal; Notable for the following components:   Leukocytes,Ua TRACE (*)    Bacteria, UA RARE (*)    All other components within normal limits  GASTROINTESTINAL PANEL BY PCR, STOOL (REPLACES STOOL CULTURE)  C DIFFICILE QUICK SCREEN W PCR REFLEX    EKG EKG Interpretation  Date/Time:  Friday March 20 2020  12:48:42 EDT Ventricular Rate:  72 PR Interval:    QRS Duration: 108 QT Interval:  394 QTC Calculation: 432 R Axis:   9 Text Interpretation: Sinus rhythm Low voltage, extremity and precordial leads Baseline wander in lead(s) V1 No old tracing to compare Confirmed by Daleen Bo [phone]) on 03/20/2020 2:21:20 PM   Radiology No results found.  Procedures Procedures (including critical care time)  Medications Ordered in ED Medications  sodium chloride 0.9 % bolus 1,000 mL (1,000 mLs Intravenous New Bag/Given 03/20/20 1317)  ondansetron (ZOFRAN) injection 4 mg (4 mg Intravenous Given 03/20/20 1318)    ED Course  I have reviewed the triage vital signs and the nursing notes.  Pertinent labs & imaging results that were available during my care of the patient were reviewed by me and considered in my medical decision making (see chart for details).  Clinical Course as of Mar 20 1438  Fri Mar 20, 8786  8059 64 year old female with complaint of vomiting and diarrhea.  This problem is chronic, ongoing, is currently evaluated by GI, PCP as well as her to nephrology.  Patient has had extensive lab work-up this month, this has been reviewed. Patient is able to tolerate her medications however states that she struggles to eat due to loss of sense of smell and taste since having Covid last year. Patient was given IV fluids, labs were checked without significant findings compared to prior labs.  Attempted to screen for C. difficile and send stool panel however patient is unable to produce a stool sample while in the emergency room.  Patient was given prescription for Zofran to try before eating, recommend small bland food and advance slowly as tolerated.  Follow-up with PCP as scheduled.   [LM]    Clinical Course User Index [LM] Tacy Learn, PA-C  MDM Rules/Calculators/A&P                          Final Clinical Impression(s) / ED Diagnoses Final diagnoses:  Diarrhea, unspecified type    Non-intractable vomiting with nausea, unspecified vomiting type    Rx / DC Orders ED Discharge Orders         Ordered    ondansetron (ZOFRAN ODT) 4 MG disintegrating tablet  Every 8 hours PRN     Discontinue  Reprint     03/20/20 1438           Tacy Learn, PA-C 03/20/20 1439    Daleen Bo, MD 03/20/20 2015

## 2020-03-20 NOTE — ED Notes (Signed)
Went in to discharge patient and pt's family member states "I don't understand why you are discharging her without doing a scan"; pt states she vomits no matter what she eats; informed family I would notify the ED provider; Suella Broad informed and will go in to see patient

## 2020-03-20 NOTE — ED Triage Notes (Signed)
Patient comes to the ED with complaints of  fatigue, loss of appetite and reports nausea, vomiting, for 2 months,  diarrhea for 2 years.

## 2020-03-26 DIAGNOSIS — N08 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: Secondary | ICD-10-CM | POA: Insufficient documentation

## 2020-04-02 ENCOUNTER — Other Ambulatory Visit: Payer: Self-pay

## 2020-04-02 ENCOUNTER — Encounter (HOSPITAL_COMMUNITY): Payer: Self-pay

## 2020-04-02 NOTE — Progress Notes (Signed)
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 Ulice Bold at [facility]. Labs done on 03/20/2020 showed IFE urine with Bence-Jones proteins, but serum IFE was negative.  Today she is accompanied by her daughter. She reports that she has no appetite, constantly fatigued and has lost 25 lbs since 01/2020. She was infected with COVID in 06/2019, hospitalized for about 10 days, and her smell has not completely normalized; smelling food now makes her nauseous. She has been recovering until 01/2020. When she eats food, she gags and throws up. She can sometimes tolerate fluids. She had a C diff infection 3 years ago and since then has been having diarrhea with loose, smelly stool multiple times a day and needs to take Imodium to control it. She is feeling constantly fatigued and has to lie down, but denies SOB. She denies having any new bone pains. She reports having intermittent fevers and night sweats since 01/2020, with 3 episodes of fevers and night sweats over the past week. She has been taking Arava and Plaquenil for RA for the past 3-4 years.  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 She quit smoking 3 years ago, after smoking 1 PPD for 46 years. Mother had breast cancer; father deceased possibly from leukemia. Her last mammogram was in 2010.   MEDICAL HISTORY:  Past Medical History:  Diagnosis Date  . COPD (chronic obstructive pulmonary disease) ([place])   . Depression   . Diabetes ([place])   .  Hyperlipidemia   . Hypothyroidism   . Neuropathy   . Rheumatoid arthritis ([place])     SURGICAL HISTORY: Past Surgical History:  Procedure Laterality Date  . BIOPSY  09/16/2019   Procedure: BIOPSY;  Surgeon: Daneil Dolin, MD;  Location[facility];  Service: Endoscopy;;  . COLONOSCOPY  09/2010   Dr. Posey Pronto: mild diverticulsis in sigmoid colon.   . COLONOSCOPY WITH PROPOFOL N/A 09/16/2019   Procedure: COLONOSCOPY WITH PROPOFOL;  Surgeon: Daneil Dolin, MD;  Location[facility];  Service: Endoscopy;  Laterality: N/A;  9:00am-moved to 12/21 @ 1030am per office  . YAG LASER APPLICATION Left 83/66/2947   Procedure: YAG LASER APPLICATION;  Surgeon: Williams Che, MD;  Location: [facility] ORS;  Service: Ophthalmology;  Laterality: Left;    SOCIAL HISTORY: Social History   Socioeconomic History  . Marital status: Widowed    Spouse name: Not on file  . Number of children: 1  . Years of education: Not on file  . Highest education level: Not on file  Occupational History  . Occupation: DISABLED  Tobacco Use  . Smoking status: Former Smoker    Packs/day: 1.00    Years: 15.00    Pack years: 15.00    Types: Cigarettes    Quit date: 09/11/2017    Years since quitting: 2.5  . Smokeless tobacco: Former Network engineer  . Vaping Use: Never used  Substance and Sexual Activity  . Alcohol use: Never  . Drug use: Never  . Sexual activity: Not Currently  Other Topics Concern  .  Not on file  Social History Narrative  . Not on file   Social Determinants of Health   Financial Resource Strain:   . Difficulty of Paying Living Expenses:   Food Insecurity:   . Worried About Charity fundraiser in the Last Year:   . Arboriculturist in the Last Year:   Transportation Needs:   . Film/video editor (Medical):   Marland Kitchen Lack of Transportation (Non-Medical):   Physical Activity:   . Days of Exercise per Week:   . Minutes of Exercise per Session:   Stress:   . Feeling of Stress :    Social Connections:   . Frequency of Communication with Friends and Family:   . Frequency of Social Gatherings with Friends and Family:   . Attends Religious Services:   . Active Member of Clubs or Organizations:   . Attends Archivist Meetings:   Marland Kitchen Marital Status:   Intimate Partner Violence:   . Fear of Current or Ex-Partner:   . Emotionally Abused:   Marland Kitchen Physically Abused:   . Sexually Abused:     FAMILY HISTORY: Family History  Problem Relation Age of Onset  . Stroke Mother   . Heart disease Mother   . Diabetes Mother   . Breast cancer Mother   . Other Father        stomach taken out for some reason  . Migraines Daughter   . Colon cancer Neg Hx     ALLERGIES:  has No Known Allergies.  MEDICATIONS:  Current Outpatient Medications  Medication Sig Dispense Refill  . Cholecalciferol (VITAMIN D3) 125 MCG (5000 UT) TABS Take 1 tablet by mouth daily.    . citalopram (CELEXA) 20 MG tablet Take 20 mg by mouth daily.    . Ferrous Sulfate Dried (FERROUS SULFATE IRON PO) Take by mouth.    . gabapentin (NEURONTIN) 300 MG capsule Take 300 mg by mouth at bedtime.    Marland Kitchen HEARTBURN RELIEF MAX ST 20 MG tablet Take 1 tablet by mouth 2 (two) times daily.    . hydroxychloroquine (PLAQUENIL) 200 MG tablet Take 200 mg by mouth 2 (two) times daily.     Marland Kitchen leflunomide (ARAVA) 20 MG tablet Take 20 mg by mouth daily.    Marland Kitchen levothyroxine (SYNTHROID, LEVOTHROID) 112 MCG tablet Take 112 mcg by mouth daily before breakfast.    . pravastatin (PRAVACHOL) 40 MG tablet Take 40 mg by mouth at bedtime.     . traZODone (DESYREL) 100 MG tablet Take 100 mg by mouth at bedtime.    Marland Kitchen acetaminophen (TYLENOL) 325 MG tablet Take 650 mg by mouth every 6 (six) hours as needed for moderate pain or headache.  (Patient not taking: Reported on 04/03/2020)    . loperamide (IMODIUM A-D) 2 MG tablet Take by mouth.  (Patient not taking: Reported on 04/03/2020)    . ondansetron (ZOFRAN ODT) 4 MG disintegrating tablet Take  1 tablet (4 mg total) by mouth every 8 (eight) hours as needed for nausea or vomiting. (Patient not taking: Reported on 04/02/2020) 12 tablet 0   No current facility-administered medications for this visit.    REVIEW OF SYSTEMS:   Review of Systems  Constitutional: Positive for appetite change (depleted), diaphoresis (intermittent at night), fatigue (depleted) and fever (intermittent at night).  Respiratory: Positive for shortness of breath (w/ exertion).   Cardiovascular: Positive for palpitations.  Gastrointestinal: Positive for diarrhea and nausea.  Psychiatric/Behavioral: Positive for depression. The patient is nervous/anxious.  All other systems reviewed and are negative.    PHYSICAL EXAMINATION: ECOG PERFORMANCE STATUS: 1 - Symptomatic but completely ambulatory  Vitals:   04/03/20 0817  BP: 121/85  Pulse: 86  Resp: 18  Temp: (!) 97.3 F (36.3 C)  SpO2: 95%   Filed Weights   04/03/20 0817  Weight: 110 lb (49.9 kg)   Physical Exam Vitals reviewed.  Constitutional:      Appearance: Normal appearance.  Cardiovascular:     Rate and Rhythm: Normal rate and regular rhythm.     Pulses: Normal pulses.     Heart sounds: Normal heart sounds.  Pulmonary:     Effort: Pulmonary effort is normal.     Breath sounds: Normal breath sounds.  Abdominal:     Palpations: Abdomen is soft. There is no hepatomegaly, splenomegaly or mass.     Tenderness: There is no abdominal tenderness.  Musculoskeletal:     Right lower leg: No edema.     Left lower leg: No edema.  Lymphadenopathy:     Cervical: No cervical adenopathy.     Upper Body:     Right upper body: No supraclavicular or axillary adenopathy.     Left upper body: No supraclavicular or axillary adenopathy.     Lower Body: No right inguinal adenopathy. No left inguinal adenopathy.  Neurological:     General: No focal deficit present.     Mental Status: She is alert and oriented to person, place, and time.  Psychiatric:         Mood and Affect: Mood normal.        Behavior: Behavior normal.      LABORATORY DATA:  I have reviewed the data as listed Recent Results (from the past 2160 hour(s))  Creatinine clearance, urine, 24 hour     Status: Abnormal   Collection Time: 03/10/20 11:50 AM  Result Value Ref Range   Urine Total Volume-CRCL 1,100 mL   Collection Interval-CRCL 24 hours   Creatinine, Urine 87.64 mg/dL   Creatinine, 24H Ur 964 600 - 1,800 mg/day   Creatinine Clearance 60 (L) 75 - 115 mL/min    Comment: Performed at [facility], [address] [place], [place] [address]  Protein, urine, 24 hour     Status: None   Collection Time: 03/10/20 11:50 AM  Result Value Ref Range   Urine Total Volume-UPROT 1,100 mL   Collection Interval-UPROT 24 hours   Protein, Urine 7 mg/dL   Protein, 24H Urine 77 50 - 100 mg/day    Comment: Performed at [facility], [address] [place], [place] [address]  CBC with Differential/Platelet     Status: Abnormal   Collection Time: 03/10/20 11:55 AM  Result Value Ref Range   WBC 6.5 4.0 - 10.5 K/uL   RBC 3.56 (L) 3.87 - 5.11 MIL/uL   Hemoglobin 10.2 (L) 12.0 - 15.0 g/dL   HCT 33.5 (L) 36 - 46 %   MCV 94.1 80.0 - 100.0 fL   MCH 28.7 26.0 - 34.0 pg   MCHC 30.4 30.0 - 36.0 g/dL   RDW 13.4 11.5 - 15.5 %   Platelets 369 150 - 400 K/uL   nRBC 0.0 0.0 - 0.2 %   Neutrophils Relative % 68 %   Neutro Abs 4.3 1.7 - 7.7 K/uL   Lymphocytes Relative 21 %   Lymphs Abs 1.4 0.7 - 4.0 K/uL   Monocytes Relative 10 %   Monocytes Absolute 0.7 0 - 1 K/uL   Eosinophils  Relative 1 %   Eosinophils Absolute 0.1 0 - 0 K/uL   Basophils Relative 0 %   Basophils Absolute 0.0 0 - 0 K/uL   Immature Granulocytes 0 %   Abs Immature Granulocytes 0.02 0.00 - 0.07 K/uL    Comment: Performed at [facility], [address], [place], [place] [address]  Comprehensive metabolic panel     Status: Abnormal   Collection Time: 03/10/20 11:55 AM  Result Value Ref Range   Sodium 137 135 - 145 mmol/L    Potassium 4.1 3.5 - 5.1 mmol/L   Chloride 99 98 - 111 mmol/L   CO2 26 22 - 32 mmol/L   Glucose, Bld 157 (H) 70 - 99 mg/dL    Comment: Glucose reference range applies only to samples taken after fasting for at least 8 hours.   BUN 8 8 - 23 mg/dL   Creatinine, Ser 1.09 (H) 0.44 - 1.00 mg/dL   Calcium 8.7 (L) 8.9 - 10.3 mg/dL   Total Protein 7.3 6.5 - 8.1 g/dL   Albumin 3.1 (L) 3.5 - 5.0 g/dL   AST 35 15 - 41 U/L   ALT 18 0 - 44 U/L   Alkaline Phosphatase 128 (H) 38 - 126 U/L   Total Bilirubin 0.4 0.3 - 1.2 mg/dL   GFR calc non Af Amer 54 (L) >60 mL/min   GFR calc Af Amer >60 >60 mL/min   Anion gap 12 5 - 15    Comment: Performed at [facility], [address], [place], [place] [address]  Ferritin     Status: Abnormal   Collection Time: 03/10/20 11:55 AM  Result Value Ref Range   Ferritin 527 (H) 11 - 307 ng/mL    Comment: Performed at [facility], [address], [place], [place] [address]  Hemoglobin A1c     Status: Abnormal   Collection Time: 03/10/20 11:55 AM  Result Value Ref Range   Hgb A1c MFr Bld 7.0 (H) 4.8 - 5.6 %    Comment: (NOTE) Pre diabetes:          5.7%-6.4%  Diabetes:              >6.4%  Glycemic control for   <7.0% adults with diabetes    Mean Plasma Glucose 154.2 mg/dL    Comment: Performed at [facility], [place] [address], [place], [place] [address]  Iron and TIBC     Status: Abnormal   Collection Time: 03/10/20 11:55 AM  Result Value Ref Range   Iron 34 28 - 170 ug/dL   TIBC 231 (L) 250 - 450 ug/dL   Saturation Ratios 15 10.4 - 31.8 %   UIBC 197 ug/dL    Comment: Performed at [facility], [address], [place], [place] [address]  Magnesium     Status: None   Collection Time: 03/10/20 11:55 AM  Result Value Ref Range   Magnesium 1.9 1.7 - 2.4 mg/dL    Comment: Performed at [facility], [address] [place], [place] [address]  Phosphorus     Status: None   Collection Time: 03/10/20 11:55 AM  Result Value Ref Range   Phosphorus 4.1 2.5  - 4.6 mg/dL    Comment: Performed at [facility], [address], [place], [place] [address]  Vitamin B12     Status: None   Collection Time: 03/10/20 11:55 AM  Result Value Ref Range   Vitamin B-12 189 180 - 914 pg/mL    Comment: (NOTE)  This assay is not validated for testing neonatal or myeloproliferative syndrome specimens for Vitamin B12 levels. Performed at [facility], [address], [place], [place] [address]   Protein / creatinine ratio, urine     Status: None   Collection Time: 03/10/20 11:55 AM  Result Value Ref Range   Creatinine, Urine 91.49 mg/dL   Total Protein, Urine 6 mg/dL    Comment: NO NORMAL RANGE ESTABLISHED FOR THIS TEST   Protein Creatinine Ratio 0.07 0.00 - 0.15 mg/mg[Cre]    Comment: Performed at [facility], [address] [place], [place] [address]  Protein electrophoresis, serum     Status: None   Collection Time: 03/10/20 11:55 AM  Result Value Ref Range   Total Protein ELP 6.4 6.0 - 8.5 g/dL   Albumin ELP 3.0 2.9 - 4.4 g/dL   Alpha-1-Globulin 0.4 0.0 - 0.4 g/dL   Alpha-2-Globulin 0.9 0.4 - 1.0 g/dL   Beta Globulin 1.0 0.7 - 1.3 g/dL   Gamma Globulin 1.1 0.4 - 1.8 g/dL   M-Spike, % Not Observed Not Observed g/dL   SPE Interp. Comment     Comment: (NOTE) The SPE pattern appears unremarkable. Evidence of monoclonal protein is not apparent. Performed At: [facility] [place], [place] [id number] Rush Farmer MD [id number]    Comment Comment     Comment: (NOTE) Protein electrophoresis scan will follow via computer, mail, or courier delivery.    Globulin, Total 3.4 2.2 - 3.9 g/dL   A/G Ratio 0.9 0.7 - 1.7  Hepatitis B surface antigen     Status: None   Collection Time: 03/10/20 11:55 AM  Result Value Ref Range   Hepatitis B Surface Ag NON REACTIVE NON REACTIVE    Comment: Performed at [facility] [address], [place],  [address]  Hepatitis C genotype     Status: None   Collection Time: 03/10/20  11:55 AM  Result Value Ref Range   HCV Genotype Comment     Comment: (NOTE) Specimen has insufficient hepatitis C virus RNA to obtain genotyping results. This genotyping assay should only be used for known HCV positive patients with HCV RNA levels above 1000 IU/mL.    Please Note (HCV): Comment     Comment: (NOTE) This test was developed and its performance characteristics determined by [facility].  It has not been cleared or approved by the U.S. Food and Drug Administration. The FDA has determined that such clearance or approval is not necessary. This test is used for clinical purposes.  It should not be regarded as investigational or for research. Performed At: [facility] [place], [place] [id number] Rush Farmer MD [id number]   Immunofixation, urine     Status: Abnormal   Collection Time: 03/10/20 11:55 AM  Result Value Ref Range   Immunofixation, Urine Comment (A)     Comment: (NOTE) Bence Jones Protein positive; kappa type. Performed At: [facility] [place], [place] [id number] Rush Farmer MD [id number]   Immunofixation electrophoresis     Status: None   Collection Time: 03/10/20 11:55 AM  Result Value Ref Range   Total Protein ELP 6.4 6.0 - 8.5 g/dL   IgG (Immunoglobin G), Serum 1,008 586 - 1,602 mg/dL   IgA 313 87 - 352 mg/dL   IgM (Immunoglobulin M), Srm 159 26 - 217 mg/dL    Comment: (NOTE) Performed At: [facility] [address] [place], [place] [id number] Rush Farmer MD [id number]:[id number]  Immunofixation Result, Serum Comment     Comment: (NOTE) The immunofixation pattern appears unremarkable. Evidence of monoclonal protein is not apparent.   Creatinine, serum     Status: Abnormal   Collection Time: 03/10/20 11:55 AM  Result Value Ref Range   Creatinine, Ser 1.11 (H) 0.44 - 1.00 mg/dL   GFR calc non Af Amer 53 (L) >60 mL/min   GFR calc Af Amer >60 >60 mL/min    Comment: Performed at [facility], [address], [place], [place] [address]  ANCA titers     Status: Abnormal   Collection Time: 03/10/20 11:56 AM  Result Value Ref Range   C-ANCA <1:20 Neg:<1:20 titer   P-ANCA 1:80 (H) Neg:<1:20 titer    Comment: (NOTE) The presence of positive fluorescence exhibiting P-ANCA or C-ANCA patterns alone is not specific for the diagnosis of Wegener's Granulomatosis (WG) or microscopic polyangiitis. Decisions about treatment should not be based solely on ANCA IFA results.  The International ANCA Group Consensus recommends follow up testing of positive sera with both PR-3 and MPO-ANCA enzyme immunoassays. As many as 5% serum samples are positive only by EIA. Ref. AM J Clin Pathol 1999;111:507-513.    Atypical P-ANCA titer <1:20 Neg:<1:20 titer    Comment: (NOTE) The atypical pANCA pattern has been observed in a significant percentage of patients with ulcerative colitis, primary sclerosing cholangitis and autoimmune hepatitis. Performed At: [facility] [place], [place] [id number] Rush Farmer MD [id number]   PTH, intact and calcium     Status: None   Collection Time: 03/10/20 11:56 AM  Result Value Ref Range   PTH 31 15 - 65 pg/mL   Calcium, Total (PTH) 9.1 8.7 - 10.3 mg/dL   PTH Interp Comment     Comment: (NOTE) Interpretation                 Intact PTH    Calcium                                (pg/mL)      (mg/dL) Normal                          15 - 65     8.6 - 10.2 Primary Hyperparathyroidism         >65          >10.2 Secondary Hyperparathyroidism       >65          <10.2 Non-Parathyroid Hypercalcemia       <65          >10.2 Hypoparathyroidism                  <15          < 8.6 Non-Parathyroid Hypocalcemia    15 - 65          < 8.6 Performed At: [facility] [place], [place] [id number] Rush Farmer MD [id number]   VITAMIN D 25 Hydroxy (Vit-D Deficiency, Fractures)     Status: None   Collection Time:  03/10/20 11:56 AM  Result Value Ref Range   Vit D, 25-Hydroxy 58.73 30 - 100 ng/mL    Comment: (NOTE) Vitamin D deficiency has been defined by the Institute of Medicine  and an Endocrine Society practice guideline as a level of serum 25-OH  vitamin D less than 20 ng/mL (  1,2). The Endocrine Society went on to  further define vitamin D insufficiency as a level between 21 and 29  ng/mL (2).  1. IOM (Institute of Medicine). 2010. Dietary reference intakes for  calcium and D. [place]: The [employer]. 2. Holick MF, Binkley Von [place]y, Bischoff-Ferrari HA, et al. Evaluation,  treatment, and prevention of vitamin D deficiency: an Endocrine  Society clinical practice guideline, JCEM. 2011 Jul; 96(7): 1911-30.  Performed at [facility], [place] [address] [place], [place] [address]   Kappa/lambda light chains     Status: Abnormal   Collection Time: 03/10/20 11:56 AM  Result Value Ref Range   Kappa free light chain 52.9 (H) 3.3 - 19.4 mg/L   Lamda free light chains 29.8 (H) 5.7 - 26.3 mg/L   Kappa, lamda light chain ratio 1.78 (H) 0.26 - 1.65    Comment: (NOTE) Performed At: [facility] [place], [place] [id number] Rush Farmer MD [id number]   Hepatitis C antibody     Status: None   Collection Time: 03/10/20 11:56 AM  Result Value Ref Range   HCV Ab NON REACTIVE NON REACTIVE    Comment: (NOTE) Nonreactive HCV antibody screen is consistent with no HCV infections,  unless recent infection is suspected or other evidence exists to indicate HCV infection.  Performed at [facility], [place] [address], [place], [place] [address]   Hepatitis B surface antibody,qualitative     Status: Abnormal   Collection Time: 03/10/20 11:56 AM  Result Value Ref Range   Hep B S Ab Reactive (A) NON REACTIVE    Comment: (NOTE) Consistent with immunity, greater than 9.9 mIU/mL.  Performed at [facility], [place] [address], [place], [place] [address]    HIV-1/2 AB - differentiation     Status: None   Collection Time: 03/10/20 11:56 AM  Result Value Ref Range   HIV 1 Ab Negative Negative   HIV 2 Ab Negative Negative   Note Negative     Comment: (NOTE) See RNA Reflex. Performed At: [facility] [place], [place] [id number] Rush Farmer MD [id number]   ANA     Status: None   Collection Time: 03/10/20 11:56 AM  Result Value Ref Range   Anti Nuclear Antibody (ANA) Negative Negative    Comment: (NOTE) Performed At: [facility] [place], [place] [id number] Rush Farmer MD [id number]   C3 complement     Status: None   Collection Time: 03/10/20 11:56 AM  Result Value Ref Range   C3 Complement 148 82 - 167 mg/dL    Comment: (NOTE) Performed At: [facility] [place], [place] [id number] Rush Farmer MD SA:[id number]   C4 complement     Status: None   Collection Time: 03/10/20 11:56 AM  Result Value Ref Range   Complement C4, Body Fluid 22 12 - 38 mg/dL    Comment: (NOTE) Performed At: [facility] [place], [place] [id number] Rush Farmer MD [id number]   Glomerular basement membrane antibodies     Status: None   Collection Time: 03/10/20 11:56 AM  Result Value Ref Range   GBM Ab 4 0 - 20 units    Comment: (NOTE)                   Negative                   0 - 20  Weak Positive             21 - 30                   Moderate to Strong Positive   >30 Performed At: [facility] [place], [place] [id number] Rush Farmer MD [id number]   Miscellaneous [facility] test (send-out)     Status: None   Collection Time: 03/10/20 11:56 AM  Result Value Ref Range   [facility] test code [id number]     Comment: CORRECTED ON 06/15 AT 1332: PREVIOUSLY REPORTED AS BLOOD   [facility] test name PHOSPHOLIPASE A2 RECEPTOR AUTOANTIBODIES IGG     Comment: CORRECTED ON 06/15 AT 1332: PREVIOUSLY REPORTED AS  BLOOD   Source ([facility]) BLOOD     Comment: Performed at [facility], [address], [place], [place] [address]   Misc [facility] result COMMENT     Comment: (NOTE) Performed At: [facility] [place], [place] [id number] Rush Farmer MD [id number]   RNA Qualitative     Status: None   Collection Time: 03/10/20 11:56 AM  Result Value Ref Range   HIV 1 RNA Qualitative 1 RNA Negative    Comment: Negative Negative for HIV    Final Interpretation Comment     Comment: (NOTE) HIV antibodies were not confirmed and HIV 1 RNA was not detected. No laboratory evidence of HIV 1 infection. Follow-up testing for HIV 2 should be performed if clinically indicated. Performed At: [facility] [place], [place] [id number] Rush Farmer MD [id number]   Uric acid     Status: None   Collection Time: 03/13/20  2:00 PM  Result Value Ref Range   Uric Acid, Serum 5.2 2.5 - 7.1 mg/dL    Comment: Performed at [facility], [address], [place], [place] [address]  Comprehensive metabolic panel     Status: Abnormal   Collection Time: 03/20/20 12:43 PM  Result Value Ref Range   Sodium 136 135 - 145 mmol/L   Potassium 3.4 (L) 3.5 - 5.1 mmol/L   Chloride 99 98 - 111 mmol/L   CO2 25 22 - 32 mmol/L   Glucose, Bld 113 (H) 70 - 99 mg/dL    Comment: Glucose reference range applies only to samples taken after fasting for at least 8 hours.   BUN 8 8 - 23 mg/dL   Creatinine, Ser 1.08 (H) 0.44 - 1.00 mg/dL   Calcium 8.5 (L) 8.9 - 10.3 mg/dL   Total Protein 7.0 6.5 - 8.1 g/dL   Albumin 3.1 (L) 3.5 - 5.0 g/dL   AST 25 15 - 41 U/L   ALT 12 0 - 44 U/L   Alkaline Phosphatase 130 (H) 38 - 126 U/L   Total Bilirubin 0.3 0.3 - 1.2 mg/dL   GFR calc non Af Amer 55 (L) >60 mL/min   GFR calc Af Amer >60 >60 mL/min   Anion gap 12 5 - 15    Comment: Performed at [facility], [address] [place], [place] [address]  CBC with Differential     Status: Abnormal    Collection Time: 03/20/20 12:43 PM  Result Value Ref Range   WBC 7.5 4.0 - 10.5 K/uL   RBC 3.48 (L) 3.87 - 5.11 MIL/uL   Hemoglobin 9.9 (L) 12.0 - 15.0 g/dL   HCT 32.6 (L) 36 - 46 %   MCV 93.7 80.0 - 100.0 fL   MCH 28.4 26.0 - 34.0 pg  MCHC 30.4 30.0 - 36.0 g/dL   RDW 13.3 11.5 - 15.5 %   Platelets 396 150 - 400 K/uL   nRBC 0.0 0.0 - 0.2 %   Neutrophils Relative % 63 %   Neutro Abs 4.7 1.7 - 7.7 K/uL   Lymphocytes Relative 25 %   Lymphs Abs 1.8 0.7 - 4.0 K/uL   Monocytes Relative 11 %   Monocytes Absolute 0.8 0 - 1 K/uL   Eosinophils Relative 1 %   Eosinophils Absolute 0.1 0 - 0 K/uL   Basophils Relative 0 %   Basophils Absolute 0.0 0 - 0 K/uL   Immature Granulocytes 0 %   Abs Immature Granulocytes 0.01 0.00 - 0.07 K/uL    Comment: Performed at [facility] [facility], [address] [place], [place] [address]  Urinalysis, Routine w reflex microscopic     Status: Abnormal   Collection Time: 03/20/20  1:03 PM  Result Value Ref Range   Color, Urine YELLOW YELLOW   APPearance CLEAR CLEAR   Specific Gravity, Urine 1.005 1.005 - 1.030   pH 5.0 5.0 - 8.0   Glucose, UA NEGATIVE NEGATIVE mg/dL   Hgb urine dipstick NEGATIVE NEGATIVE   Bilirubin Urine NEGATIVE NEGATIVE   Ketones, ur NEGATIVE NEGATIVE mg/dL   Protein, ur NEGATIVE NEGATIVE mg/dL   Nitrite NEGATIVE NEGATIVE   Leukocytes,Ua TRACE (A) NEGATIVE   RBC / HPF 0-5 0 - 5 RBC/hpf   WBC, UA 0-5 0 - 5 WBC/hpf   Bacteria, UA RARE (A) NONE SEEN   Squamous Epithelial / LPF 0-5 0 - 5   Mucus PRESENT    Hyaline Casts, UA PRESENT     Comment: Performed at [facility], [address] [place], [place] [address]  Comprehensive metabolic panel     Status: Abnormal   Collection Time: 04/03/20 10:26 AM  Result Value Ref Range   Sodium 133 (L) 135 - 145 mmol/L   Potassium 3.6 3.5 - 5.1 mmol/L   Chloride 95 (L) 98 - 111 mmol/L   CO2 26 22 - 32 mmol/L   Glucose, Bld 151 (H) 70 - 99 mg/dL    Comment: Glucose reference range applies only to  samples taken after fasting for at least 8 hours.   BUN 9 8 - 23 mg/dL   Creatinine, Ser 1.32 (H) 0.44 - 1.00 mg/dL   Calcium 8.5 (L) 8.9 - 10.3 mg/dL   Total Protein 7.0 6.5 - 8.1 g/dL   Albumin 3.0 (L) 3.5 - 5.0 g/dL   AST 38 15 - 41 U/L   ALT 16 0 - 44 U/L   Alkaline Phosphatase 127 (H) 38 - 126 U/L   Total Bilirubin 0.5 0.3 - 1.2 mg/dL   GFR calc non Af Amer 43 (L) >60 mL/min   GFR calc Af Amer 50 (L) >60 mL/min   Anion gap 12 5 - 15    Comment: Performed at [facility], [address] [place], [place] [address]  CBC with Differential     Status: Abnormal   Collection Time: 04/03/20 10:26 AM  Result Value Ref Range   WBC 6.4 4.0 - 10.5 K/uL   RBC 3.53 (L) 3.87 - 5.11 MIL/uL   Hemoglobin 9.8 (L) 12.0 - 15.0 g/dL   HCT 33.0 (L) 36 - 46 %   MCV 93.5 80.0 - 100.0 fL   MCH 27.8 26.0 - 34.0 pg   MCHC 29.7 (L) 30.0 - 36.0 g/dL   RDW 13.6 11.5 - 15.5 %  Platelets 393 150 - 400 K/uL   nRBC 0.0 0.0 - 0.2 %   Neutrophils Relative % 65 %   Neutro Abs 4.1 1.7 - 7.7 K/uL   Lymphocytes Relative 22 %   Lymphs Abs 1.4 0.7 - 4.0 K/uL   Monocytes Relative 12 %   Monocytes Absolute 0.8 0 - 1 K/uL   Eosinophils Relative 1 %   Eosinophils Absolute 0.0 0 - 0 K/uL   Basophils Relative 0 %   Basophils Absolute 0.0 0 - 0 K/uL   Immature Granulocytes 0 %   Abs Immature Granulocytes 0.01 0.00 - 0.07 K/uL    Comment: Performed at [facility], [address], [place], [place] [address]  Lactate dehydrogenase     Status: None   Collection Time: 04/03/20 10:26 AM  Result Value Ref Range   LDH 186 98 - 192 U/L    Comment: Performed at [facility], [address], [place], [place] [address]  Folate     Status: None   Collection Time: 04/03/20 10:26 AM  Result Value Ref Range   Folate 7.4 >5.9 ng/mL    Comment: Performed at [facility], [address], [place], [place] [address]  Ferritin     Status: Abnormal   Collection Time: 04/03/20 10:26 AM  Result Value Ref Range   Ferritin 700 (H) 11 -  307 ng/mL    Comment: Performed at [facility], [address], [place], [place] [address]  Reticulocytes     Status: Abnormal   Collection Time: 04/03/20 10:26 AM  Result Value Ref Range   Retic Ct Pct 1.8 0.4 - 3.1 %   RBC. 3.56 (L) 3.87 - 5.11 MIL/uL   Retic Count, Absolute 64.8 19.0 - 186.0 K/uL   Immature Retic Fract 14.0 2.3 - 15.9 %    Comment: Performed at [facility], [address] [place],  [address]    RADIOGRAPHIC STUDIES: I have personally reviewed the radiological images as listed and agreed with the findings in the report. US RENAL  Result Date: 03/10/2020 CLINICAL DATA:  Acute renal failure EXAM: RENAL / URINARY TRACT ULTRASOUND COMPLETE COMPARISON:  None. FINDINGS: Right Kidney: Renal measurements: 9.1 x 4.2 x 4.7 cm = volume: 96 mL . Echogenicity within normal limits. No mass or hydronephrosis visualized. Left Kidney: Renal measurements: 9.5 x 4.7 x 4.6 cm = volume: 110 mL. Echogenicity within normal limits. No mass or hydronephrosis visualized. Bladder: Urinary bladder is empty. Other: The liver appears slightly echogenic. IMPRESSION: 1. Normal ultrasound appearance of the kidneys 2. The liver appears echogenic suggesting steatosis. Electronically Signed   By: Donavan Foil M.D.   On: 03/10/2020 21:42    ASSESSMENT:  1.  Bence-Jones proteinuria: -24-hour urine done by Dr. Theador Hawthorne showed urine immunofixation positive for Bence-Jones proteinuria, kappa type.  24-hour total protein was 77 mg/day. -Free kappa light chains were 52.9, lambda light chains 29.8 with ratio of 1.78.  Serum immunofixation was unremarkable.  SPEP did not show any evidence of M spike.  2.  Weight loss: -25 pound weight loss in the last 2 months. -She smoked cigarettes 1 pack/day for 46 years, and quit 3 years ago. -She reports that she cannot eat due to decreased appetite.  Also feels very nauseous on smelling of foods.  3.  Normocytic anemia: -CBC on 03/20/2020 shows hemoglobin 9.9 with  MCV of 93.  White count and platelets were normal. -Colonoscopy on 09/16/2019 shows diverticulosis in the entire examined colon.  Nonbleeding internal hemorrhoids.  4.  Rheumatoid arthritis: -She is on Plaquenil and Areva for the last 4 years.   PLAN:  1.  Bence-Jones proteinuria: -We will check serum free light chains and immunofixation, beta-2 microglobulin and LDH levels. -We will also check skeletal survey to evaluate for lytic lesions.  2.  Weight loss: -Because of rapid weight loss in the last 2 months, I have recommended CT scan of the chest, abdomen and pelvis.  3.  Normocytic anemia: -We will check methylmalonic acid, folic acid, haptoglobin, LDH, and reticulocyte count.   All questions were answered. The patient knows to call the clinic with any problems, questions or concerns.  Derek Jack, MD 04/03/20 4:56 PM  [place] [phone]   I, Milinda Antis, am acting as a scribe for Dr. Sanda Linger.  I, Derek Jack MD, have reviewed the above documentation for accuracy and completeness, and I agree with the above.

## 2020-04-03 ENCOUNTER — Inpatient Hospital Stay (HOSPITAL_COMMUNITY): Payer: 59 | Attending: Hematology | Admitting: Hematology

## 2020-04-03 ENCOUNTER — Ambulatory Visit (HOSPITAL_COMMUNITY)
Admission: RE | Admit: 2020-04-03 | Discharge: 2020-04-03 | Disposition: A | Discharge status: Home / Self Care | Payer: 59 | Source: Ambulatory Visit | Attending: Hematology | Admitting: Hematology

## 2020-04-03 ENCOUNTER — Inpatient Hospital Stay (HOSPITAL_COMMUNITY): Payer: 59

## 2020-04-03 DIAGNOSIS — Z803 Family history of malignant neoplasm of breast: Secondary | ICD-10-CM | POA: Insufficient documentation

## 2020-04-03 DIAGNOSIS — Z87891 Personal history of nicotine dependence: Secondary | ICD-10-CM | POA: Diagnosis not present

## 2020-04-03 DIAGNOSIS — M069 Rheumatoid arthritis, unspecified: Secondary | ICD-10-CM | POA: Insufficient documentation

## 2020-04-03 DIAGNOSIS — Z8616 Personal history of COVID-19: Secondary | ICD-10-CM | POA: Diagnosis not present

## 2020-04-03 DIAGNOSIS — D472 Monoclonal gammopathy: Secondary | ICD-10-CM

## 2020-04-03 DIAGNOSIS — R803 Bence Jones proteinuria: Secondary | ICD-10-CM | POA: Insufficient documentation

## 2020-04-03 DIAGNOSIS — D649 Anemia, unspecified: Secondary | ICD-10-CM | POA: Diagnosis not present

## 2020-04-03 DIAGNOSIS — R634 Abnormal weight loss: Secondary | ICD-10-CM | POA: Diagnosis not present

## 2020-04-03 LAB — FOLATE: Folate: 7.4 ng/mL (ref >5.9)

## 2020-04-03 LAB — CBC WITH DIFFERENTIAL/PLATELET
Abs Immature Granulocytes: 0.01 10*3/uL (ref 0.00–0.07)
Basophils Absolute: 0 10*3/uL (ref 0.0–0.1)
Basophils Relative: 0 %
Eosinophils Absolute: 0 10*3/uL (ref 0.0–0.5)
Eosinophils Relative: 1 %
HCT: 33 % — ABNORMAL LOW (ref 36.0–46.0)
Hemoglobin: 9.8 g/dL — ABNORMAL LOW (ref 12.0–15.0)
Immature Granulocytes: 0 %
Lymphocytes Relative: 22 %
Lymphs Abs: 1.4 10*3/uL (ref 0.7–4.0)
MCH: 27.8 pg (ref 26.0–34.0)
MCHC: 29.7 g/dL — ABNORMAL LOW (ref 30.0–36.0)
MCV: 93.5 fL (ref 80.0–100.0)
Monocytes Absolute: 0.8 10*3/uL (ref 0.1–1.0)
Monocytes Relative: 12 %
Neutro Abs: 4.1 10*3/uL (ref 1.7–7.7)
Neutrophils Relative %: 65 %
Platelets: 393 10*3/uL (ref 150–400)
RBC: 3.53 MIL/uL — ABNORMAL LOW (ref 3.87–5.11)
RDW: 13.6 % (ref 11.5–15.5)
WBC: 6.4 10*3/uL (ref 4.0–10.5)
nRBC: 0 % (ref 0.0–0.2)

## 2020-04-03 LAB — COMPREHENSIVE METABOLIC PANEL
ALT: 16 U/L (ref 0–44)
AST: 38 U/L (ref 15–41)
Albumin: 3 g/dL — ABNORMAL LOW (ref 3.5–5.0)
Alkaline Phosphatase: 127 U/L — ABNORMAL HIGH (ref 38–126)
Anion gap: 12 (ref 5–15)
BUN: 9 mg/dL (ref 8–23)
CO2: 26 mmol/L (ref 22–32)
Calcium: 8.5 mg/dL — ABNORMAL LOW (ref 8.9–10.3)
Chloride: 95 mmol/L — ABNORMAL LOW (ref 98–111)
Creatinine, Ser: 1.32 mg/dL — ABNORMAL HIGH (ref 0.44–1.00)
GFR calc Af Amer: 50 mL/min — ABNORMAL LOW (ref >60)
GFR calc non Af Amer: 43 mL/min — ABNORMAL LOW (ref >60)
Glucose, Bld: 151 mg/dL — ABNORMAL HIGH (ref 70–99)
Potassium: 3.6 mmol/L (ref 3.5–5.1)
Sodium: 133 mmol/L — ABNORMAL LOW (ref 135–145)
Total Bilirubin: 0.5 mg/dL (ref 0.3–1.2)
Total Protein: 7 g/dL (ref 6.5–8.1)

## 2020-04-03 LAB — RETICULOCYTES
Immature Retic Fract: 14 % (ref 2.3–15.9)
RBC.: 3.56 MIL/uL — ABNORMAL LOW (ref 3.87–5.11)
Retic Count, Absolute: 64.8 10*3/uL (ref 19.0–186.0)
Retic Ct Pct: 1.8 % (ref 0.4–3.1)

## 2020-04-03 LAB — LACTATE DEHYDROGENASE: LDH: 186 U/L (ref 98–192)

## 2020-04-03 LAB — FERRITIN: Ferritin: 700 ng/mL — ABNORMAL HIGH (ref 11–307)

## 2020-04-03 IMAGING — DX DG BONE SURVEY MET
10 series · 10 of 10 positions shown · non-contrast
Comparison: None.

CLINICAL DATA: MGUS

EXAM:
METASTATIC BONE SURVEY

[skull lat]
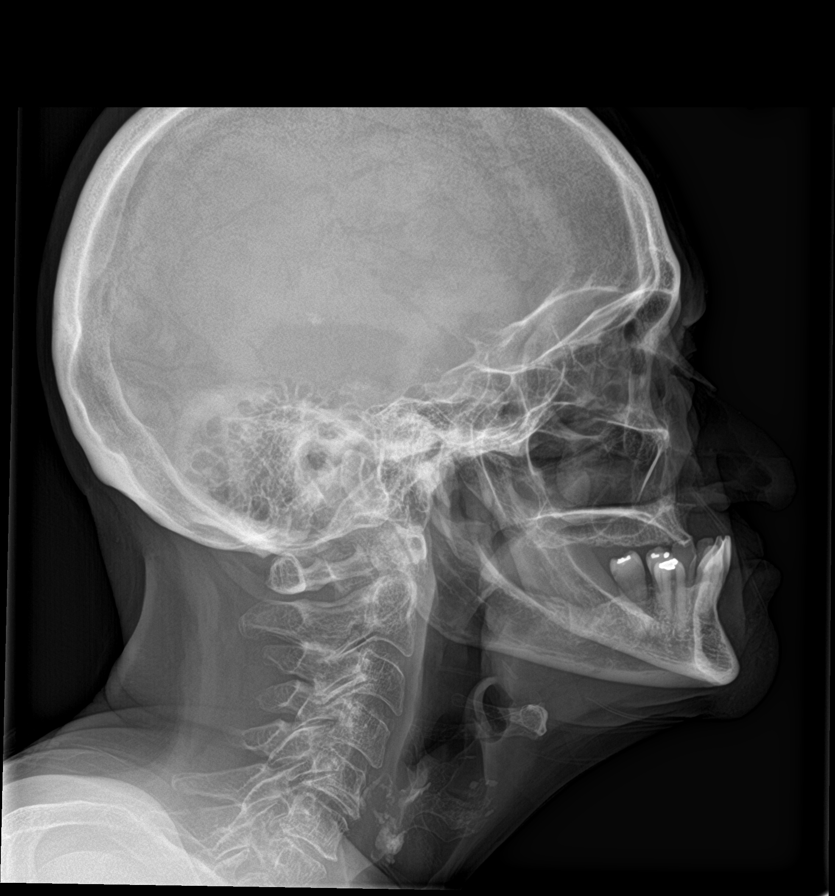

[shoulder ap (1 of 2)]
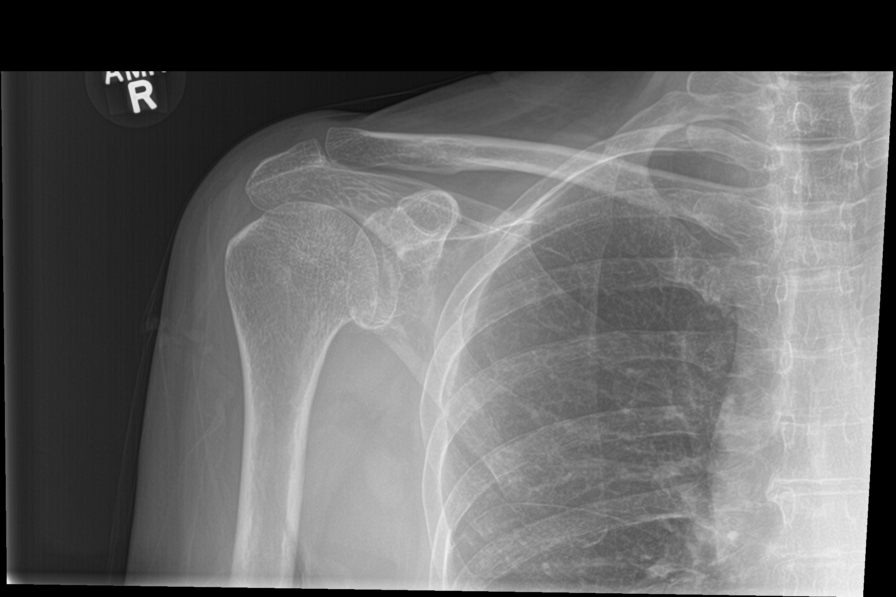

[shoulder ap (2 of 2)]
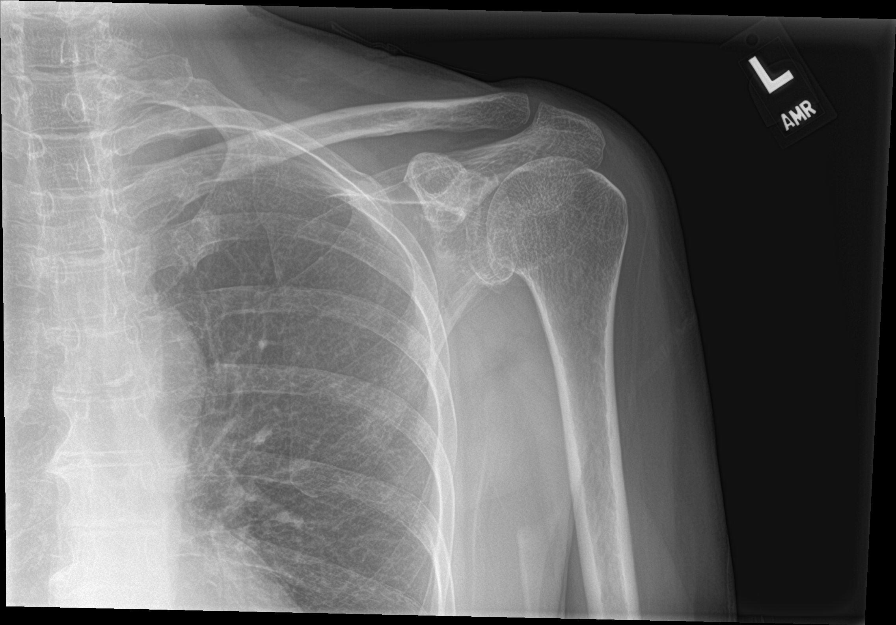

[humerus ap (1 of 2)]
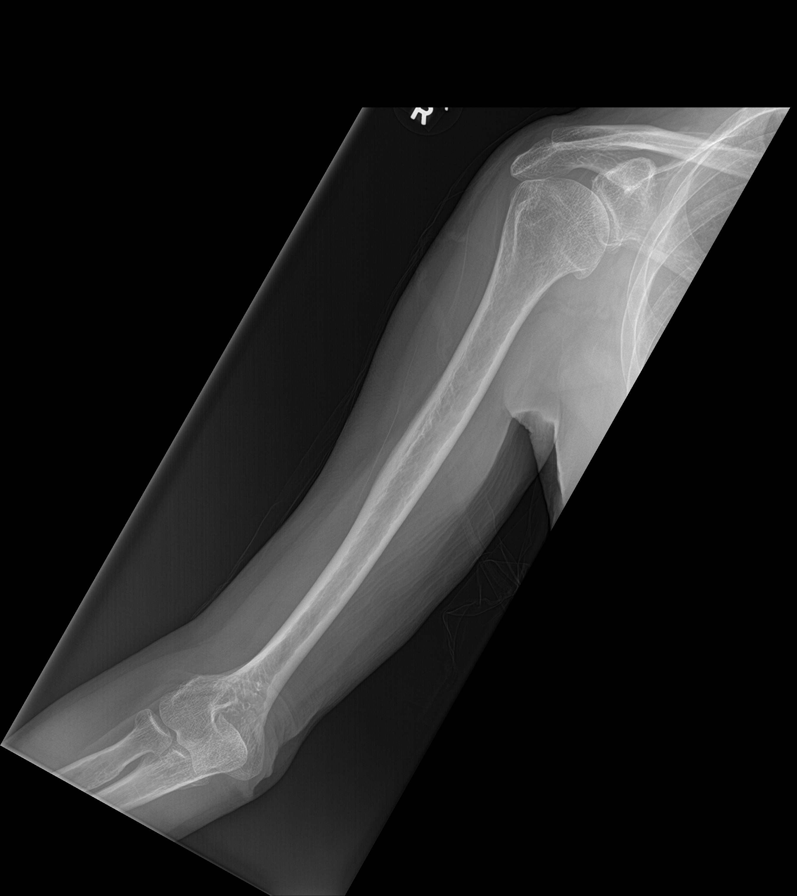

[humerus ap (2 of 2)]
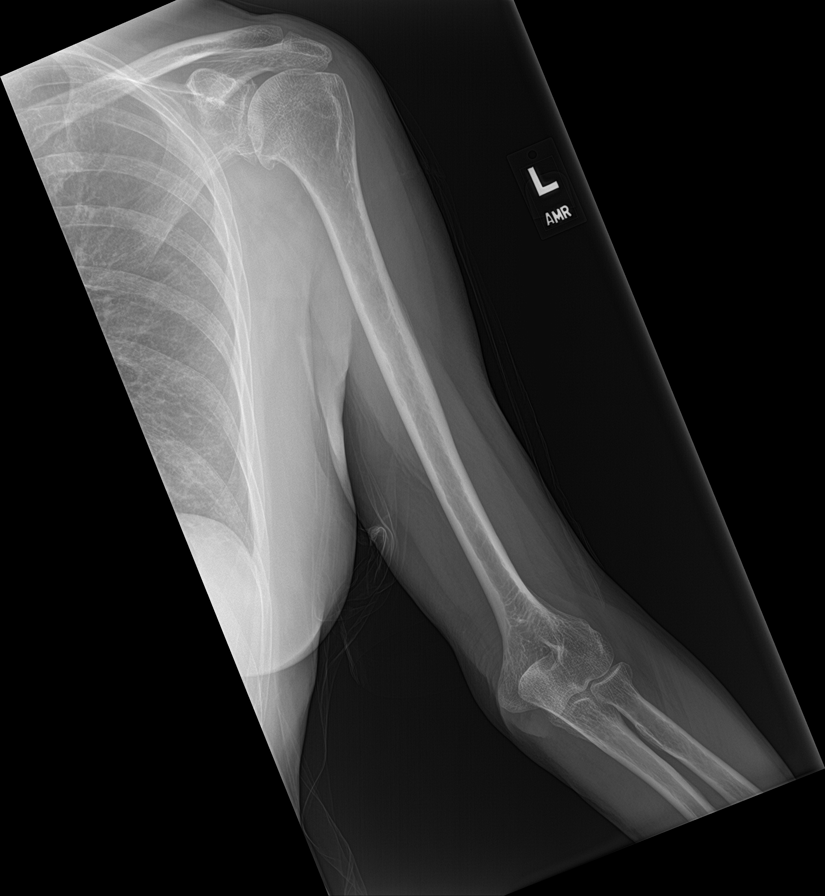

[forearm ap (1 of 2)]
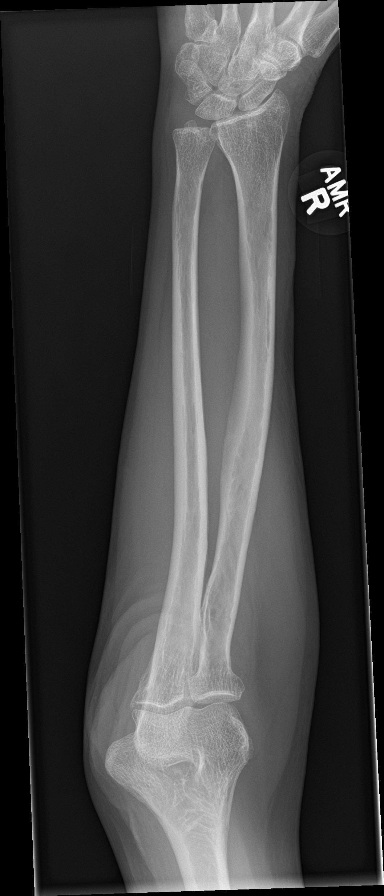

[forearm ap (2 of 2)]
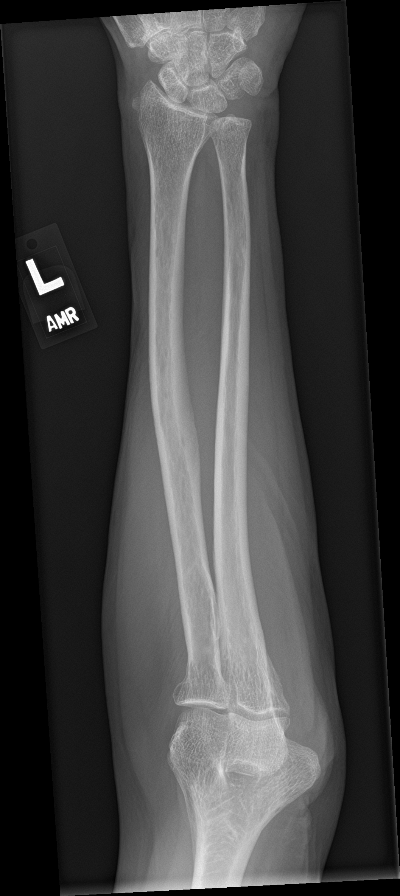

[c-spine ap]
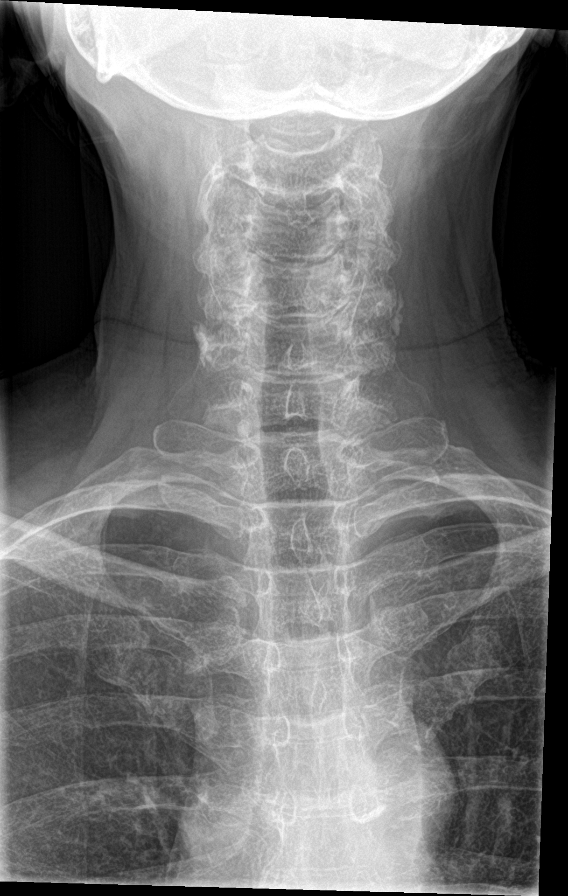

[c-spine lat]
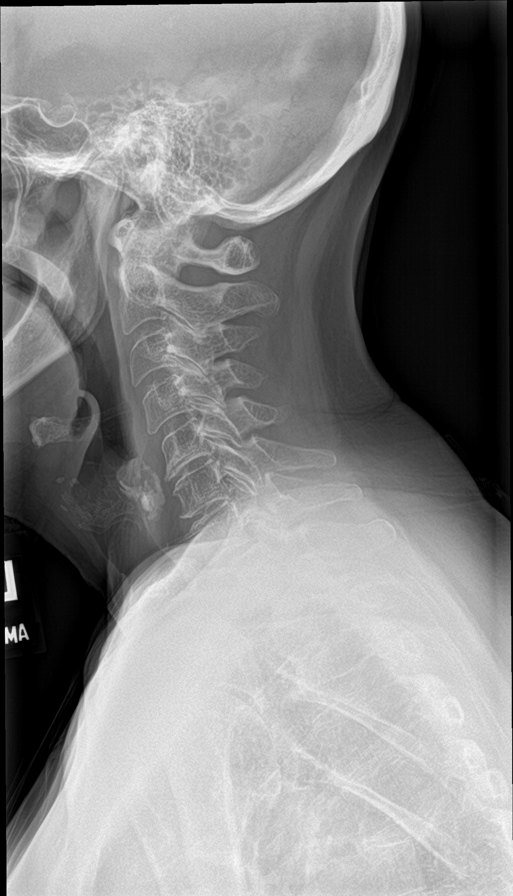

[t-spine lat]
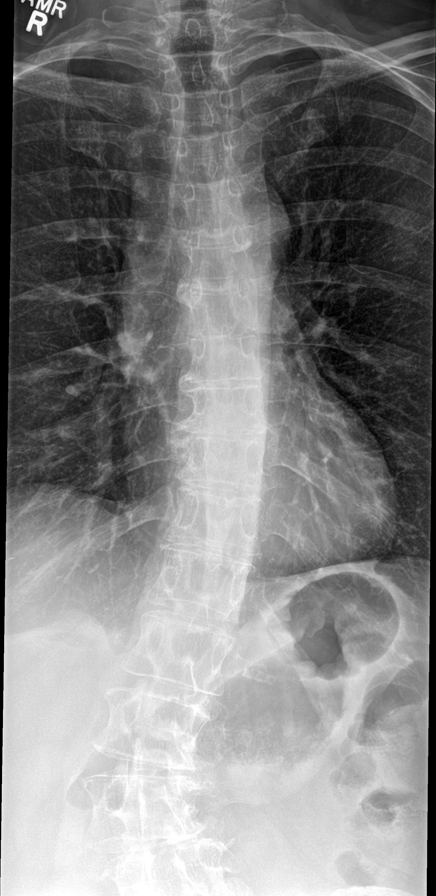

[10 of 10 positions shown; findings below may reference images not displayed]

FINDINGS: No evidence of focal lytic or blastic lesions throughout the axial
or appendicular skeleton. Cortical margins of the long bones are
intact. Scoliotic curvature in the thoracolumbar spine with mild
degenerative change. There is degenerative change of both shoulders
and hips. The lungs are clear. Normal bowel gas pattern. Aortic
atherosclerosis.
IMPRESSION: No evidence of focal lytic or blastic lesion throughout the axial or
appendicular skeleton.

## 2020-04-03 NOTE — Patient Instructions (Addendum)
[place] at [facility] Discharge Instructions  You were seen today by Dr. Delton Coombes. He went over your recent results and possible diagnoses, including MGUS (monoclonal gammopathy of unspecified significance). You had further blood work done today. You will be scheduled for x-rays of your bones and a CT scan. Dr. Delton Coombes will see you back in 2 weeks for labs and follow up.   Thank you for choosing [place] at [facility] to provide your oncology and hematology care.  To afford each patient quality time with our provider, please arrive at least 15 minutes before your scheduled appointment time.   If you have a lab appointment with the [place] please come in thru the Main Entrance and check in at the main information desk  You need to re-schedule your appointment should you arrive 10 or more minutes late.  We strive to give you quality time with our providers, and arriving late affects you and other patients whose appointments are after yours.  Also, if you no show three or more times for appointments you may be dismissed from the clinic at the providers discretion.     Again, thank you for choosing [facility].  Our hope is that these requests will decrease the amount of time that you wait before being seen by our physicians.       _____________________________________________________________  Should you have questions after your visit to [facility], please contact our office at [phone] between the hours of 8:00 a.m. and 4:30 p.m.  Voicemails left after 4:00 p.m. will not be returned until the following business day.  For prescription refill requests, have your pharmacy contact our office and allow 72 hours.    Cancer Center Support Programs:   > Cancer Support Group  2nd Tuesday of the month 1pm-2pm, Journey Room

## 2020-04-04 LAB — HAPTOGLOBIN: Haptoglobin: 365 mg/dL — ABNORMAL HIGH (ref 37–355)

## 2020-04-05 LAB — METHYLMALONIC ACID, SERUM: Methylmalonic Acid, Quantitative: 131 nmol/L (ref 0–378)

## 2020-04-06 LAB — KAPPA/LAMBDA LIGHT CHAINS
Kappa free light chain: 61.3 mg/L — ABNORMAL HIGH (ref 3.3–19.4)
Kappa, lambda light chain ratio: 1.9 — ABNORMAL HIGH (ref 0.26–1.65)
Lambda free light chains: 32.3 mg/L — ABNORMAL HIGH (ref 5.7–26.3)

## 2020-04-07 ENCOUNTER — Encounter (HOSPITAL_COMMUNITY): Payer: Self-pay

## 2020-04-07 LAB — IMMUNOFIXATION ELECTROPHORESIS
IgA: 349 mg/dL (ref 87–352)
IgG (Immunoglobin G), Serum: 1115 mg/dL (ref 586–1602)
IgM (Immunoglobulin M), Srm: 163 mg/dL (ref 26–217)
Total Protein ELP: 6.6 g/dL (ref 6.0–8.5)

## 2020-04-08 ENCOUNTER — Other Ambulatory Visit: Payer: Self-pay

## 2020-04-08 ENCOUNTER — Ambulatory Visit (INDEPENDENT_AMBULATORY_CARE_PROVIDER_SITE_OTHER): Payer: 59 | Admitting: Gastroenterology

## 2020-04-08 ENCOUNTER — Encounter: Payer: Self-pay | Admitting: Gastroenterology

## 2020-04-08 ENCOUNTER — Encounter (HOSPITAL_COMMUNITY): Payer: Self-pay

## 2020-04-08 VITALS — BP 111/72 | HR 93 | Temp 97.5°F | Ht 64.0 in | Wt 107.6 lb

## 2020-04-08 DIAGNOSIS — K529 Noninfective gastroenteritis and colitis, unspecified: Secondary | ICD-10-CM

## 2020-04-08 DIAGNOSIS — R112 Nausea with vomiting, unspecified: Secondary | ICD-10-CM

## 2020-04-08 DIAGNOSIS — R634 Abnormal weight loss: Secondary | ICD-10-CM

## 2020-04-08 LAB — BETA 2 MICROGLOBULIN, SERUM: Beta-2 Microglobulin: 5.8 mg/L — ABNORMAL HIGH (ref 0.6–2.4)

## 2020-04-08 MED ORDER — ONDANSETRON 4 MG PO TBDP: 4.0000 mg | ORAL_TABLET | Freq: Four times a day (QID) | ORAL | 1 refills | Status: DC

## 2020-04-08 MED ORDER — PANTOPRAZOLE SODIUM 40 MG PO TBEC
40.0000 mg | DELAYED_RELEASE_TABLET | Freq: Every day | ORAL | 1 refills | Status: DC
Start: 2020-04-08 — End: 2020-07-07

## 2020-04-08 NOTE — H&P (View-Only) (Signed)
Primary Care Physician: [facility]  Primary Gastroenterologist:  Garfield Cornea, MD   Chief Complaint  Patient presents with  . Weight Loss    lost approx 28 lbs in past few months  . Diarrhea  . Nausea    vomiting    HPI: Erica Beasley is a 64 y.o. female here for follow-up.  Last seen in August 2020.  She has a history of rheumatoid arthritis, COPD, diabetes mellitus.  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  We last saw her in August 2020 she was finally noting significant improvement but not quite back at baseline.  Completed a colonoscopy in December 2020, noted to have diverticulosis.  Random colon biopsies were negative for microscopic colitis.  Plans for repeat colonoscopy in 5 years for surveillance purposes.   She had additional C. difficile GDH antigen negative, toxin A+B negative back in December.  For persistent diarrhea she was started on empiric Creon because of the fatty nature of her stools.  She weighed 136 pounds in 08/2019, now down to 107 pounds.  Scheduled for CT chest, abdomen, pelvis, ordered by Dr. Delton Coombes.  Bone survey negative for focal lytic or blastic lesions.  Last 3-4 days 10 BMS, yellow liquid/bubbles.no nocturnal stools.  No formed bowel movements.no nocturnal diarrhea. Imodium will stop diarrhea for 24 hours. No melena, brbpr.  Fatty appearance to her stool. Smell of food makes her sick. Vomiting about twice per week. No heartburn. Symptoms present for a couple of months. Daughter states she was doing well and two months ago, all these symptoms started and she dropped 30 pounds. Patient complains of bitter or salty taste to all of her foods. Never fully recovered sense of taste/smell since she had covid last year. 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 Thought she was recovering ok but two months ago noted these symptoms and increased fatigue and  sleeps a lot.   Eating mostly Jello, yogurt, popcicles. Not consuming much solid foods or protein. She stopped Creon since not on any solid foods. No solid foods since Mother's day.      Current Outpatient Medications  Medication Sig Dispense Refill  . Cholecalciferol (VITAMIN D3) 125 MCG (5000 UT) TABS Take 1 tablet by mouth daily.    . citalopram (CELEXA) 20 MG tablet Take 20 mg by mouth daily.    . Ferrous Sulfate Dried (FERROUS SULFATE IRON PO) Take by mouth daily.     Marland Kitchen gabapentin (NEURONTIN) 300 MG capsule Take 300 mg by mouth 2 (two) times daily.     . hydroxychloroquine (PLAQUENIL) 200 MG tablet Take 200 mg by mouth 2 (two) times daily.     Marland Kitchen leflunomide (ARAVA) 20 MG tablet Take 20 mg by mouth daily.    Marland Kitchen levothyroxine (SYNTHROID) 150 MCG tablet Take 150 mcg by mouth daily before breakfast.    . loperamide (IMODIUM A-D) 2 MG tablet Take by mouth as needed.     . ondansetron (ZOFRAN ODT) 4 MG disintegrating tablet Take 1 tablet (4 mg total) by mouth every 8 (eight) hours as needed for nausea or vomiting. 12 tablet 0  . pravastatin (PRAVACHOL) 40 MG tablet Take 40 mg by mouth at bedtime.     . traZODone (DESYREL) 100 MG tablet Take 100 mg by mouth at bedtime.     No current facility-administered medications for this visit.    Allergies as of  04/08/2020  . (No Known Allergies)   Past Medical History:  Diagnosis Date  . COPD (chronic obstructive pulmonary disease) ([place])   . Depression   . Diabetes ([place])   . Hyperlipidemia   . Hypothyroidism   . Neuropathy   . Rheumatoid arthritis [facility])    Past Surgical History:  Procedure Laterality Date  . BIOPSY  09/16/2019   Procedure: BIOPSY;  Surgeon: Daneil Dolin, MD;  Location: [facility] SUITE;  Service: Endoscopy;;  . COLONOSCOPY  09/2010   Dr. Posey Pronto: mild diverticulsis in sigmoid colon.   . COLONOSCOPY WITH PROPOFOL N/A 09/16/2019   Rourk: Diverticulosis, random colon biopsies negative for microscopic colitis.  . YAG LASER  APPLICATION Left 15/90/1724   Procedure: YAG LASER APPLICATION;  Surgeon: Williams Che, MD;  Location: [facility] ORS;  Service: Ophthalmology;  Laterality: Left;   Family History  Problem Relation Age of Onset  . Stroke Mother   . Heart disease Mother   . Diabetes Mother   . Breast cancer Mother   . Other Father        stomach taken out for some reason  . Migraines Daughter   . Colon cancer Neg Hx    Social History   Tobacco Use  . Smoking status: Former Smoker    Packs/day: 1.00    Years: 15.00    Pack years: 15.00    Types: Cigarettes    Quit date: 09/11/2017    Years since quitting: 2.5  . Smokeless tobacco: Former Network engineer  . Vaping Use: Never used  Substance Use Topics  . Alcohol use: Never  . Drug use: Never    ROS:  General: Negative for fever, chills, fatigue, weakness.see hpi ENT: Negative for hoarseness, difficulty swallowing , nasal congestion. CV: Negative for chest pain, angina, palpitations, dyspnea on exertion, peripheral edema.  Respiratory: Negative for dyspnea at rest, dyspnea on exertion, cough, sputum, wheezing.  GI: See history of present illness. GU:  Negative for dysuria, hematuria, urinary incontinence, urinary frequency, nocturnal urination.  Endo: see hpi   Physical Examination:   BP 111/72   Pulse 93   Temp (!) 97.5 F (36.4 C) (Oral)   Ht 5' 4" (1.626 m)   Wt 107 lb 9.6 oz (48.8 kg)   BMI 18.47 kg/m   General: Well-nourished, well-developed in no acute distress.  Eyes: No icterus. Mouth: masked. Lungs: Clear to auscultation bilaterally.  Heart: Regular rate and rhythm, no murmurs rubs or gallops.  Abdomen: Bowel sounds are normal, nontender, nondistended, no hepatosplenomegaly or masses, no abdominal bruits or hernia , no rebound or guarding.   Extremities: No lower extremity edema. No clubbing or deformities. Neuro: Alert and oriented x 4   Skin: Warm and dry, no jaundice.   Psych: Alert and cooperative, normal mood and  affect.  Labs:  Lab Results  Component Value Date   CREATININE 1.32 (H) 04/03/2020   BUN 9 04/03/2020   NA 133 (L) 04/03/2020   K 3.6 04/03/2020   CL 95 (L) 04/03/2020   CO2 26 04/03/2020   Lab Results  Component Value Date   ALT 16 04/03/2020   AST 38 04/03/2020   ALKPHOS 127 (H) 04/03/2020   BILITOT 0.5 04/03/2020   Lab Results  Component Value Date   WBC 6.4 04/03/2020   HGB 9.8 (L) 04/03/2020   HCT 33.0 (L) 04/03/2020   MCV 93.5 04/03/2020   PLT 393 04/03/2020   Lab Results  Component Value Date  IRON 34 03/10/2020   TIBC 231 (L) 03/10/2020   FERRITIN 700 (H) 04/03/2020   Lab Results  Component Value Date   VITAMINB12 189 03/10/2020   Lab Results  Component Value Date   FOLATE 7.4 04/03/2020   No results found for: TSH   Lab Results  Component Value Date   HGBA1C 7.0 (H) 03/10/2020   No results found for: TSH   Imaging Studies: US RENAL  Result Date: 03/10/2020 CLINICAL DATA:  Acute renal failure EXAM: RENAL / URINARY TRACT ULTRASOUND COMPLETE COMPARISON:  None. FINDINGS: Right Kidney: Renal measurements: 9.1 x 4.2 x 4.7 cm = volume: 96 mL . Echogenicity within normal limits. No mass or hydronephrosis visualized. Left Kidney: Renal measurements: 9.5 x 4.7 x 4.6 cm = volume: 110 mL. Echogenicity within normal limits. No mass or hydronephrosis visualized. Bladder: Urinary bladder is empty. Other: The liver appears slightly echogenic. IMPRESSION: 1. Normal ultrasound appearance of the kidneys 2. The liver appears echogenic suggesting steatosis. Electronically Signed   By: Donavan Foil M.D.   On: 03/10/2020 21:42   DG Bone Survey Met  Result Date: 04/03/2020 CLINICAL DATA:  MGUS EXAM: METASTATIC BONE SURVEY COMPARISON:  None. FINDINGS: No evidence of focal lytic or blastic lesions throughout the axial or appendicular skeleton. Cortical margins of the long bones are intact. Scoliotic curvature in the thoracolumbar spine with mild degenerative change. There is  degenerative change of both shoulders and hips. The lungs are clear. Normal bowel gas pattern. Aortic atherosclerosis. IMPRESSION: No evidence of focal lytic or blastic lesion throughout the axial or appendicular skeleton. Electronically Signed   By: Keith Rake M.D.   On: 04/03/2020 21:29   Impression/Plan:  64 y/o female presenting for further evaluation of N/V/D, 30 pound weight loss. She also has normocytic anemia, elevated ferritin. Currently undergoing evaluation by Dr. Delton Coombes for Bence-Jones proteinuria, anemia, and weight loss. CT chest/abd/pelvis scheduled.   For her UGI symptoms, will start pantoprazole 65m daily for possible reflux/gastritis/pud. Add zofran 463mevery six hours before meals and at bedtime for nausea. Encouraged her to add back soft foods with protein and good nutritional value to help improve nutrition and gain weight. If CTs unremarkable, would consider EGD with propofol as next step. ASA III.  For diarrhea, she notes that imodium controls her symptoms but she is not taking it. Encouraged her to take 102m27mvery morning, repeat second dose in afternoon if needed. If persisting symptoms, would consider repeat stool studies. Add back Creon as she incorporates solid foods.   For weight loss, she will continue to weigh herself three times weekly. Call with additional weight loss. We will follow up CT scans as available.

## 2020-04-08 NOTE — Patient Instructions (Signed)
1. Start pantoprazole 40mg  daily before breakfast for reflux/gastritis.  2. Start zofran every six hours (at breakfast, lunch, dinner, and bedtime) for nausea. Takes around the clock for now so we can get you eating again. 3. Slowly add back soft foods with good nutritional value. Make sure you get some protein as well.  4. Take imodium every morning to prevent diarrhea. You can take second dose later in day if needed.  5. Complete CT scans as ordered by Dr. Delton Coombes.  6. Continue to weigh yourself at least three times per week. Let us know if you have additional weight loss.

## 2020-04-08 NOTE — Progress Notes (Signed)
Primary Care Physician: [facility]  Primary Gastroenterologist:  Garfield Cornea, MD   Chief Complaint  Patient presents with  . Weight Loss    lost approx 28 lbs in past few months  . Diarrhea  . Nausea    vomiting    HPI: Erica Beasley is a 64 y.o. female here for follow-up.  Last seen in August 2020.  She has a history of rheumatoid arthritis, COPD, diabetes mellitus.  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  We last saw her in August 2020 she was finally noting significant improvement but not quite back at baseline.  Completed a colonoscopy in December 2020, noted to have diverticulosis.  Random colon biopsies were negative for microscopic colitis.  Plans for repeat colonoscopy in 5 years for surveillance purposes.   She had additional C. difficile GDH antigen negative, toxin A+B negative back in December.  For persistent diarrhea she was started on empiric Creon because of the fatty nature of her stools.  She weighed 136 pounds in 08/2019, now down to 107 pounds.  Scheduled for CT chest, abdomen, pelvis, ordered by Dr. Delton Coombes.  Bone survey negative for focal lytic or blastic lesions.  Last 3-4 days 10 BMS, yellow liquid/bubbles.no nocturnal stools.  No formed bowel movements.no nocturnal diarrhea. Imodium will stop diarrhea for 24 hours. No melena, brbpr.  Fatty appearance to her stool. Smell of food makes her sick. Vomiting about twice per week. No heartburn. Symptoms present for a couple of months. Daughter states she was doing well and two months ago, all these symptoms started and she dropped 30 pounds. Patient complains of bitter or salty taste to all of her foods. Never fully recovered sense of taste/smell since she had covid last year. 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 Thought she was recovering ok but two months ago noted these symptoms and increased fatigue and  sleeps a lot.   Eating mostly Jello, yogurt, popcicles. Not consuming much solid foods or protein. She stopped Creon since not on any solid foods. No solid foods since Mother's day.      Current Outpatient Medications  Medication Sig Dispense Refill  . Cholecalciferol (VITAMIN D3) 125 MCG (5000 UT) TABS Take 1 tablet by mouth daily.    . citalopram (CELEXA) 20 MG tablet Take 20 mg by mouth daily.    . Ferrous Sulfate Dried (FERROUS SULFATE IRON PO) Take by mouth daily.     Marland Kitchen gabapentin (NEURONTIN) 300 MG capsule Take 300 mg by mouth 2 (two) times daily.     . hydroxychloroquine (PLAQUENIL) 200 MG tablet Take 200 mg by mouth 2 (two) times daily.     Marland Kitchen leflunomide (ARAVA) 20 MG tablet Take 20 mg by mouth daily.    Marland Kitchen levothyroxine (SYNTHROID) 150 MCG tablet Take 150 mcg by mouth daily before breakfast.    . loperamide (IMODIUM A-D) 2 MG tablet Take by mouth as needed.     . ondansetron (ZOFRAN ODT) 4 MG disintegrating tablet Take 1 tablet (4 mg total) by mouth every 8 (eight) hours as needed for nausea or vomiting. 12 tablet 0  . pravastatin (PRAVACHOL) 40 MG tablet Take 40 mg by mouth at bedtime.     . traZODone (DESYREL) 100 MG tablet Take 100 mg by mouth at bedtime.     No current facility-administered medications for this visit.    Allergies as of  04/08/2020  . (No Known Allergies)   Past Medical History:  Diagnosis Date  . COPD (chronic obstructive pulmonary disease) ([place])   . Depression   . Diabetes ([place])   . Hyperlipidemia   . Hypothyroidism   . Neuropathy   . Rheumatoid arthritis [facility])    Past Surgical History:  Procedure Laterality Date  . BIOPSY  09/16/2019   Procedure: BIOPSY;  Surgeon: Daneil Dolin, MD;  Location: [facility] ENDO SUITE;  Service: Endoscopy;;  . COLONOSCOPY  09/2010   Dr. Posey Pronto: mild diverticulsis in sigmoid colon.   . COLONOSCOPY WITH PROPOFOL N/A 09/16/2019   Rourk: Diverticulosis, random colon biopsies negative for microscopic colitis.  . YAG LASER  APPLICATION Left 15/90/1724   Procedure: YAG LASER APPLICATION;  Surgeon: Williams Che, MD;  Location: [facility] ORS;  Service: Ophthalmology;  Laterality: Left;   Family History  Problem Relation Age of Onset  . Stroke Mother   . Heart disease Mother   . Diabetes Mother   . Breast cancer Mother   . Other Father        stomach taken out for some reason  . Migraines Daughter   . Colon cancer Neg Hx    Social History   Tobacco Use  . Smoking status: Former Smoker    Packs/day: 1.00    Years: 15.00    Pack years: 15.00    Types: Cigarettes    Quit date: 09/11/2017    Years since quitting: 2.5  . Smokeless tobacco: Former Network engineer  . Vaping Use: Never used  Substance Use Topics  . Alcohol use: Never  . Drug use: Never    ROS:  General: Negative for fever, chills, fatigue, weakness.see hpi ENT: Negative for hoarseness, difficulty swallowing , nasal congestion. CV: Negative for chest pain, angina, palpitations, dyspnea on exertion, peripheral edema.  Respiratory: Negative for dyspnea at rest, dyspnea on exertion, cough, sputum, wheezing.  GI: See history of present illness. GU:  Negative for dysuria, hematuria, urinary incontinence, urinary frequency, nocturnal urination.  Endo: see hpi   Physical Examination:   BP 111/72   Pulse 93   Temp (!) 97.5 F (36.4 C) (Oral)   Ht 5' 4" (1.626 m)   Wt 107 lb 9.6 oz (48.8 kg)   BMI 18.47 kg/m   General: Well-nourished, well-developed in no acute distress.  Eyes: No icterus. Mouth: masked. Lungs: Clear to auscultation bilaterally.  Heart: Regular rate and rhythm, no murmurs rubs or gallops.  Abdomen: Bowel sounds are normal, nontender, nondistended, no hepatosplenomegaly or masses, no abdominal bruits or hernia , no rebound or guarding.   Extremities: No lower extremity edema. No clubbing or deformities. Neuro: Alert and oriented x 4   Skin: Warm and dry, no jaundice.   Psych: Alert and cooperative, normal mood and  affect.  Labs:  Lab Results  Component Value Date   CREATININE 1.32 (H) 04/03/2020   BUN 9 04/03/2020   NA 133 (L) 04/03/2020   K 3.6 04/03/2020   CL 95 (L) 04/03/2020   CO2 26 04/03/2020   Lab Results  Component Value Date   ALT 16 04/03/2020   AST 38 04/03/2020   ALKPHOS 127 (H) 04/03/2020   BILITOT 0.5 04/03/2020   Lab Results  Component Value Date   WBC 6.4 04/03/2020   HGB 9.8 (L) 04/03/2020   HCT 33.0 (L) 04/03/2020   MCV 93.5 04/03/2020   PLT 393 04/03/2020   Lab Results  Component Value Date  IRON 34 03/10/2020   TIBC 231 (L) 03/10/2020   FERRITIN 700 (H) 04/03/2020   Lab Results  Component Value Date   VITAMINB12 189 03/10/2020   Lab Results  Component Value Date   FOLATE 7.4 04/03/2020   No results found for: TSH   Lab Results  Component Value Date   HGBA1C 7.0 (H) 03/10/2020   No results found for: TSH   Imaging Studies: US RENAL  Result Date: 03/10/2020 CLINICAL DATA:  Acute renal failure EXAM: RENAL / URINARY TRACT ULTRASOUND COMPLETE COMPARISON:  None. FINDINGS: Right Kidney: Renal measurements: 9.1 x 4.2 x 4.7 cm = volume: 96 mL . Echogenicity within normal limits. No mass or hydronephrosis visualized. Left Kidney: Renal measurements: 9.5 x 4.7 x 4.6 cm = volume: 110 mL. Echogenicity within normal limits. No mass or hydronephrosis visualized. Bladder: Urinary bladder is empty. Other: The liver appears slightly echogenic. IMPRESSION: 1. Normal ultrasound appearance of the kidneys 2. The liver appears echogenic suggesting steatosis. Electronically Signed   By: Donavan Foil M.D.   On: 03/10/2020 21:42   DG Bone Survey Met  Result Date: 04/03/2020 CLINICAL DATA:  MGUS EXAM: METASTATIC BONE SURVEY COMPARISON:  None. FINDINGS: No evidence of focal lytic or blastic lesions throughout the axial or appendicular skeleton. Cortical margins of the long bones are intact. Scoliotic curvature in the thoracolumbar spine with mild degenerative change. There is  degenerative change of both shoulders and hips. The lungs are clear. Normal bowel gas pattern. Aortic atherosclerosis. IMPRESSION: No evidence of focal lytic or blastic lesion throughout the axial or appendicular skeleton. Electronically Signed   By: Keith Rake M.D.   On: 04/03/2020 21:29   Impression/Plan:  64 y/o female presenting for further evaluation of N/V/D, 30 pound weight loss. She also has normocytic anemia, elevated ferritin. Currently undergoing evaluation by Dr. Delton Coombes for Bence-Jones proteinuria, anemia, and weight loss. CT chest/abd/pelvis scheduled.   For her UGI symptoms, will start pantoprazole 65m daily for possible reflux/gastritis/pud. Add zofran 463mevery six hours before meals and at bedtime for nausea. Encouraged her to add back soft foods with protein and good nutritional value to help improve nutrition and gain weight. If CTs unremarkable, would consider EGD with propofol as next step. ASA III.  For diarrhea, she notes that imodium controls her symptoms but she is not taking it. Encouraged her to take 102m27mvery morning, repeat second dose in afternoon if needed. If persisting symptoms, would consider repeat stool studies. Add back Creon as she incorporates solid foods.   For weight loss, she will continue to weigh herself three times weekly. Call with additional weight loss. We will follow up CT scans as available.

## 2020-04-10 ENCOUNTER — Telehealth: Payer: Self-pay | Admitting: Emergency Medicine

## 2020-04-10 NOTE — Telephone Encounter (Signed)
PA Key: [id number] - PA Case ID: [id number] - Rx #: [id number] help? Call us at [phone] Status pending  Sent to [facility] Ondansetron 4MG  dispersible tablets

## 2020-04-13 ENCOUNTER — Other Ambulatory Visit: Payer: Self-pay | Admitting: Emergency Medicine

## 2020-04-13 ENCOUNTER — Telehealth: Payer: Self-pay | Admitting: Emergency Medicine

## 2020-04-13 NOTE — Telephone Encounter (Signed)
Pa for zofran was denied. Pt notified stated she will use good rx at [place] and medication will $18.64  Pt stated she will call her current pharmacy [facility] and have the rx transferred to [place]

## 2020-04-21 ENCOUNTER — Telehealth (HOSPITAL_COMMUNITY): Payer: Self-pay | Admitting: Hematology

## 2020-04-22 ENCOUNTER — Encounter: Payer: Self-pay | Admitting: *Deleted

## 2020-04-22 ENCOUNTER — Ambulatory Visit (HOSPITAL_COMMUNITY): Payer: 59

## 2020-04-22 ENCOUNTER — Telehealth: Payer: Self-pay | Admitting: *Deleted

## 2020-04-22 NOTE — Telephone Encounter (Signed)
Spoke with patient daughter. EGD with propofol scheduled with RMR for 8/3 at 8:30am. She is aware patient needs pre-op/covid test prior. Instructions for EGD sent via [facility].

## 2020-04-22 NOTE — Telephone Encounter (Signed)
Received call from [facility] in Endo. Patient procedure needs to be moved down to end of day.  Called pt daughter and she was agreeable to moving procedure down to 2:15pm on 8/3. Aware will send new instructions to mychart and will send message when covid/pre-op scheduled.

## 2020-04-24 ENCOUNTER — Ambulatory Visit (HOSPITAL_COMMUNITY): Payer: Self-pay | Admitting: Hematology

## 2020-04-27 ENCOUNTER — Encounter (HOSPITAL_COMMUNITY)
Admission: RE | Admit: 2020-04-27 | Discharge: 2020-04-27 | Disposition: A | Discharge status: Home / Self Care | Payer: 59 | Source: Ambulatory Visit | Attending: Internal Medicine | Admitting: Internal Medicine

## 2020-04-27 ENCOUNTER — Other Ambulatory Visit: Payer: Self-pay

## 2020-04-27 ENCOUNTER — Ambulatory Visit: Payer: Self-pay | Admitting: Nurse Practitioner

## 2020-04-27 ENCOUNTER — Encounter (HOSPITAL_COMMUNITY): Payer: Self-pay

## 2020-04-27 ENCOUNTER — Other Ambulatory Visit (HOSPITAL_COMMUNITY)
Admission: RE | Admit: 2020-04-27 | Discharge: 2020-04-27 | Disposition: A | Discharge status: Home / Self Care | Payer: Self-pay | Source: Ambulatory Visit | Attending: Internal Medicine | Admitting: Internal Medicine

## 2020-04-27 DIAGNOSIS — Z01812 Encounter for preprocedural laboratory examination: Secondary | ICD-10-CM | POA: Insufficient documentation

## 2020-04-27 DIAGNOSIS — Z20822 Contact with and (suspected) exposure to covid-19: Secondary | ICD-10-CM | POA: Diagnosis not present

## 2020-04-27 LAB — SARS CORONAVIRUS 2 (TAT 6-24 HRS): SARS Coronavirus 2: NEGATIVE

## 2020-04-28 ENCOUNTER — Encounter (HOSPITAL_COMMUNITY): Payer: Self-pay | Admitting: Internal Medicine

## 2020-04-28 ENCOUNTER — Encounter (HOSPITAL_COMMUNITY)
Admission: RE | Disposition: A | Discharge status: Home / Self Care | Payer: Self-pay | Source: Home / Self Care | Attending: Internal Medicine

## 2020-04-28 ENCOUNTER — Ambulatory Visit (HOSPITAL_COMMUNITY): Payer: 59 | Admitting: Anesthesiology

## 2020-04-28 ENCOUNTER — Ambulatory Visit (HOSPITAL_COMMUNITY)
Admission: RE | Admit: 2020-04-28 | Discharge: 2020-04-28 | Disposition: A | Discharge status: Home / Self Care | Payer: 59 | Attending: Internal Medicine | Admitting: Internal Medicine

## 2020-04-28 DIAGNOSIS — Z8616 Personal history of COVID-19: Secondary | ICD-10-CM | POA: Diagnosis not present

## 2020-04-28 DIAGNOSIS — J449 Chronic obstructive pulmonary disease, unspecified: Secondary | ICD-10-CM | POA: Diagnosis not present

## 2020-04-28 DIAGNOSIS — Z79890 Hormone replacement therapy (postmenopausal): Secondary | ICD-10-CM | POA: Diagnosis not present

## 2020-04-28 DIAGNOSIS — E039 Hypothyroidism, unspecified: Secondary | ICD-10-CM | POA: Insufficient documentation

## 2020-04-28 DIAGNOSIS — G629 Polyneuropathy, unspecified: Secondary | ICD-10-CM | POA: Insufficient documentation

## 2020-04-28 DIAGNOSIS — D649 Anemia, unspecified: Secondary | ICD-10-CM | POA: Insufficient documentation

## 2020-04-28 DIAGNOSIS — M069 Rheumatoid arthritis, unspecified: Secondary | ICD-10-CM | POA: Insufficient documentation

## 2020-04-28 DIAGNOSIS — Z79899 Other long term (current) drug therapy: Secondary | ICD-10-CM | POA: Insufficient documentation

## 2020-04-28 DIAGNOSIS — K219 Gastro-esophageal reflux disease without esophagitis: Secondary | ICD-10-CM | POA: Insufficient documentation

## 2020-04-28 DIAGNOSIS — F329 Major depressive disorder, single episode, unspecified: Secondary | ICD-10-CM | POA: Insufficient documentation

## 2020-04-28 DIAGNOSIS — Z87891 Personal history of nicotine dependence: Secondary | ICD-10-CM | POA: Diagnosis not present

## 2020-04-28 DIAGNOSIS — E785 Hyperlipidemia, unspecified: Secondary | ICD-10-CM | POA: Insufficient documentation

## 2020-04-28 DIAGNOSIS — R112 Nausea with vomiting, unspecified: Secondary | ICD-10-CM | POA: Diagnosis present

## 2020-04-28 DIAGNOSIS — K269 Duodenal ulcer, unspecified as acute or chronic, without hemorrhage or perforation: Secondary | ICD-10-CM | POA: Insufficient documentation

## 2020-04-28 DIAGNOSIS — R197 Diarrhea, unspecified: Secondary | ICD-10-CM | POA: Diagnosis not present

## 2020-04-28 DIAGNOSIS — R634 Abnormal weight loss: Secondary | ICD-10-CM | POA: Insufficient documentation

## 2020-04-28 DIAGNOSIS — F419 Anxiety disorder, unspecified: Secondary | ICD-10-CM | POA: Insufficient documentation

## 2020-04-28 DIAGNOSIS — Z681 Body mass index (BMI) 19 or less, adult: Secondary | ICD-10-CM | POA: Diagnosis not present

## 2020-04-28 DIAGNOSIS — K2970 Gastritis, unspecified, without bleeding: Secondary | ICD-10-CM | POA: Insufficient documentation

## 2020-04-28 DIAGNOSIS — E119 Type 2 diabetes mellitus without complications: Secondary | ICD-10-CM | POA: Diagnosis not present

## 2020-04-28 HISTORY — PX: ESOPHAGOGASTRODUODENOSCOPY (EGD) WITH PROPOFOL: SHX5813

## 2020-04-28 HISTORY — PX: BIOPSY: SHX5522

## 2020-04-28 LAB — GLUCOSE, CAPILLARY: Glucose-Capillary: 81 mg/dL (ref 70–99)

## 2020-04-28 SURGERY — ESOPHAGOGASTRODUODENOSCOPY (EGD) WITH PROPOFOL
Anesthesia: General

## 2020-04-28 MED ORDER — LIDOCAINE VISCOUS HCL 2 % MT SOLN
15.0000 mL | Freq: Once | OROMUCOSAL | Status: AC
  Administered 2020-04-28: 15 mL via OROMUCOSAL

## 2020-04-28 MED ORDER — CHLORHEXIDINE GLUCONATE CLOTH 2 % EX PADS: 6.0000 | MEDICATED_PAD | Freq: Once | CUTANEOUS | Status: DC

## 2020-04-28 MED ORDER — PROPOFOL 10 MG/ML IV BOLUS
INTRAVENOUS | Status: AC
  Filled 2020-04-28: qty 80

## 2020-04-28 MED ORDER — GLYCOPYRROLATE 0.2 MG/ML IJ SOLN
0.2000 mg | Freq: Once | INTRAMUSCULAR | Status: AC
  Administered 2020-04-28: 0.2 mg via INTRAVENOUS

## 2020-04-28 MED ORDER — LIDOCAINE HCL (CARDIAC) PF 50 MG/5ML IV SOSY
PREFILLED_SYRINGE | INTRAVENOUS | Status: DC | PRN
Start: 2020-04-28 — End: 2020-04-28
  Administered 2020-04-28: 70 mg via INTRAVENOUS

## 2020-04-28 MED ORDER — LACTATED RINGERS IV SOLN
Freq: Once | INTRAVENOUS | Status: AC
  Administered 2020-04-28: 1000 mL via INTRAVENOUS

## 2020-04-28 MED ORDER — STERILE WATER FOR IRRIGATION IR SOLN
Status: DC | PRN
  Administered 2020-04-28: 1.5 mL

## 2020-04-28 MED ORDER — GLYCOPYRROLATE 0.2 MG/ML IJ SOLN
INTRAMUSCULAR | Status: AC
  Filled 2020-04-28: qty 1

## 2020-04-28 MED ORDER — LACTATED RINGERS IV SOLN: INTRAVENOUS | Status: DC | PRN

## 2020-04-28 MED ORDER — PROPOFOL 500 MG/50ML IV EMUL
INTRAVENOUS | Status: DC | PRN
  Administered 2020-04-28: 90 mg via INTRAVENOUS
  Administered 2020-04-28: 150 ug/kg/min via INTRAVENOUS

## 2020-04-28 MED ORDER — LIDOCAINE VISCOUS HCL 2 % MT SOLN
OROMUCOSAL | Status: AC
  Filled 2020-04-28: qty 15

## 2020-04-28 MED ORDER — LIDOCAINE 2% (20 MG/ML) 5 ML SYRINGE
INTRAMUSCULAR | Status: AC
  Filled 2020-04-28: qty 5

## 2020-04-28 NOTE — Transfer of Care (Signed)
Immediate Anesthesia Transfer of Care Note  Patient: Erica Beasley  Procedure(s) Performed: ESOPHAGOGASTRODUODENOSCOPY (EGD) WITH PROPOFOL (N/A ) BIOPSY  Patient Location: PACU  Anesthesia Type:General  Level of Consciousness: awake, alert , oriented and patient cooperative  Airway & Oxygen Therapy: Patient Spontanous Breathing  Post-op Assessment: Report given to RN, Post -op Vital signs reviewed and stable and Patient moving all extremities  Post vital signs: Reviewed and stable  Last Vitals:  Vitals Value Taken Time  BP    Temp    Pulse 66 04/28/20 1438  Resp 18 04/28/20 1438  SpO2 93 % 04/28/20 1438  Vitals shown include unvalidated device data.  Last Pain:  Vitals:   04/28/20 1425  TempSrc:   PainSc: 0-No pain      Patients Stated Pain Goal: 5 (53/66/44 0347)  Complications: No complications documented.

## 2020-04-28 NOTE — Interval H&P Note (Signed)
History and Physical Interval Note:  04/28/2020 2:18 PM  Erica Beasley  has presented today for surgery, with the diagnosis of weight loss, nausea, vomiting.  The various methods of treatment have been discussed with the patient and family. After consideration of risks, benefits and other options for treatment, the patient has consented to  Procedure(s) with comments: ESOPHAGOGASTRODUODENOSCOPY (EGD) WITH PROPOFOL (N/A) - 2:15 as a surgical intervention.  The patient's history has been reviewed, patient examined, no change in status, stable for surgery.  I have reviewed the patient's chart and labs.  Questions were answered to the patient's satisfaction.     Manus Rudd  Patient seen and examined.  Denies dysphagia.  Agree with need for EGD. The risks, benefits, limitations, alternatives and imponderables have been reviewed with the patient. Potential for esophageal dilation, biopsy, etc. have also been reviewed.  Questions have been answered. All parties agreeable.

## 2020-04-28 NOTE — Anesthesia Preprocedure Evaluation (Addendum)
Anesthesia Evaluation  Patient identified by MRN, date of birth, ID band Patient awake    Reviewed: Allergy & Precautions, NPO status , Patient's Chart, lab work & pertinent test results  History of Anesthesia Complications Negative for: history of anesthetic complications  Airway Mallampati: II  TM Distance: >3 FB Neck ROM: Full    Dental  (+) Edentulous Upper, Missing, Dental Advisory Given   Pulmonary pneumonia (covid), resolved, COPD, former smoker,    Pulmonary exam normal breath sounds clear to auscultation       Cardiovascular Exercise Tolerance: Good Normal cardiovascular exam Rhythm:Regular Rate:Normal     Neuro/Psych PSYCHIATRIC DISORDERS Anxiety Depression    GI/Hepatic Neg liver ROS, GERD  Medicated and Controlled,  Endo/Other  diabetes (not on meds), Well Controlled, Type 2Hypothyroidism   Renal/GU negative Renal ROS     Musculoskeletal  (+) Arthritis , Rheumatoid disorders,    Abdominal   Peds  Hematology  (+) anemia ,   Anesthesia Other Findings   Reproductive/Obstetrics                           Anesthesia Physical Anesthesia Plan  ASA: II  Anesthesia Plan: General   Post-op Pain Management:    Induction: Intravenous  PONV Risk Score and Plan:   Airway Management Planned: Natural Airway, Nasal Cannula and Simple Face Mask  Additional Equipment:   Intra-op Plan:   Post-operative Plan:   Informed Consent: I have reviewed the patients History and Physical, chart, labs and discussed the procedure including the risks, benefits and alternatives for the proposed anesthesia with the patient or authorized representative who has indicated his/her understanding and acceptance.     Dental advisory given  Plan Discussed with: CRNA and Surgeon  Anesthesia Plan Comments:         Anesthesia Quick Evaluation

## 2020-04-28 NOTE — Op Note (Signed)
[facility] Patient Name: Erica Beasley Procedure Date: 04/28/2020 2:13 PM MRN: [id number] Date of Birth: July 26, 1956 Attending MD: Norvel Richards , MD CSN: [id number] Age: 64 Admit Type: Outpatient Procedure:                Upper GI endoscopy Indications:              Nausea with vomiting, Weight loss Providers:                Norvel Richards, MD, Charlsie Quest. Theda Sers RN, RN,                            Aram Candela Referring MD:              Medicines:                Propofol per Anesthesia Complications:            No immediate complications. Estimated Blood Loss:     Estimated blood loss was minimal. Procedure:                Pre-Anesthesia Assessment:                           - Prior to the procedure, a History and Physical                            was performed, and patient medications and                            allergies were reviewed. The patient's tolerance of                            previous anesthesia was also reviewed. The risks                            and benefits of the procedure and the sedation                            options and risks were discussed with the patient.                            All questions were answered, and informed consent                            was obtained. Prior Anticoagulants: The patient has                            taken no previous anticoagulant or antiplatelet                            agents. ASA Grade Assessment: II - A patient with                            mild systemic disease. After reviewing the risks  and benefits, the patient was deemed in                            satisfactory condition to undergo the procedure.                           After obtaining informed consent, the endoscope was                            passed under direct vision. Throughout the                            procedure, the patient's blood pressure, pulse, and                            oxygen  saturations were monitored continuously. The                            [id number]-[id number] ([id number]) scope was introduced through the                            mouth, and advanced to the second part of duodenum.                            The upper GI endoscopy was accomplished without                            difficulty. The patient tolerated the procedure                            well. Scope In: 2:27:34 PM Scope Out: 2:33:40 PM Total Procedure Duration: 0 hours 6 minutes 6 seconds  Findings:      The examined esophagus was normal.      The entire examined stomach was normal.      A few localized erosions were found in the duodenal bulb. Otherwise, the       first, second and third portion of the duodenum appeared normal.       Biopsies of the distal mid and proximal gastric mucosa along with the       second and third portion of the duodenum were taken and submitted       separately to evaluate further for eosinophilic gastroenteritis, etc. Impression:               - Normal esophagus.                           - Normal stomach.                           - Duodenal erosions. Status post gastric and                            duodenal biopsy                           - Moderate Sedation:  Moderate (conscious) sedation was personally administered by an       anesthesia professional. The following parameters were monitored: oxygen       saturation, heart rate, blood pressure, respiratory rate, EKG, adequacy       of pulmonary ventilation, and response to care. Recommendation:           - Patient has a contact number available for                            emergencies. The signs and symptoms of potential                            delayed complications were discussed with the                            patient. Return to normal activities tomorrow.                            Written discharge instructions were provided to the                            patient.                            - Advance diet as tolerated. Follow-up on                            pathology. Keep appointments for CT scans later                            this month. Further recommendations to follow. Procedure Code(s):        --- Professional ---                           [phone], Esophagogastroduodenoscopy, flexible,                            transoral; diagnostic, including collection of                            specimen(s) by brushing or washing, when performed                            (separate procedure) Diagnosis Code(s):        --- Professional ---                           K26.9, Duodenal ulcer, unspecified as acute or                            chronic, without hemorrhage or perforation                           R11.2, Nausea with vomiting, unspecified  R63.4, Abnormal weight loss CPT copyright 2019 American Medical Association. All rights reserved. The codes documented in this report are preliminary and upon coder review may  be revised to meet current compliance requirements. Cristopher Estimable. Rickey Sadowski, MD Norvel Richards, MD 04/28/2020 2:40:33 PM This report has been signed electronically. Number of Addenda: 0

## 2020-04-28 NOTE — Anesthesia Postprocedure Evaluation (Signed)
Anesthesia Post Note  Patient: Erica Beasley  Procedure(s) Performed: ESOPHAGOGASTRODUODENOSCOPY (EGD) WITH PROPOFOL (N/A ) BIOPSY  Patient location during evaluation: PACU Anesthesia Type: General Level of consciousness: awake, oriented, awake and alert and patient cooperative Pain management: pain level controlled Vital Signs Assessment: post-procedure vital signs reviewed and stable Respiratory status: spontaneous breathing, respiratory function stable and nonlabored ventilation Cardiovascular status: blood pressure returned to baseline and stable Postop Assessment: no headache and no backache Anesthetic complications: no   No complications documented.   Last Vitals:  Vitals:   04/28/20 1328  BP: (!) 151/71  Pulse: 68  Resp: 17  Temp: 36.8 C  SpO2: 100%    Last Pain:  Vitals:   04/28/20 1425  TempSrc:   PainSc: 0-No pain                 Tacy Learn

## 2020-04-28 NOTE — Discharge Instructions (Signed)
EGD Discharge instructions Please read the instructions outlined below and refer to this sheet in the next few weeks. These discharge instructions provide you with general information on caring for yourself after you leave the hospital. Your doctor may also give you specific instructions. While your treatment has been planned according to the most current medical practices available, unavoidable complications occasionally occur. If you have any problems or questions after discharge, please call your doctor. ACTIVITY  You may resume your regular activity but move at a slower pace for the next 24 hours.   Take frequent rest periods for the next 24 hours.   Walking will help expel (get rid of) the air and reduce the bloated feeling in your abdomen.   No driving for 24 hours (because of the anesthesia (medicine) used during the test).   You may shower.   Do not sign any important legal documents or operate any machinery for 24 hours (because of the anesthesia used during the test).  NUTRITION  Drink plenty of fluids.   You may resume your normal diet.   Begin with a light meal and progress to your normal diet.   Avoid alcoholic beverages for 24 hours or as instructed by your caregiver.  MEDICATIONS  You may resume your normal medications unless your caregiver tells you otherwise.  WHAT YOU CAN EXPECT TODAY  You may experience abdominal discomfort such as a feeling of fullness or "gas" pains.  FOLLOW-UP  Your doctor will discuss the results of your test with you.  SEEK IMMEDIATE MEDICAL ATTENTION IF ANY OF THE FOLLOWING OCCUR:  Excessive nausea (feeling sick to your stomach) and/or vomiting.   Severe abdominal pain and distention (swelling).   Trouble swallowing.   Temperature over 101 F (37.8 C).   Rectal bleeding or vomiting of blood.    Your stomach and duodenum looked good.  Biopsies were taken.  Further recommendations to follow pending review of pathology  report  Keep your appointment for upcoming CAT scans  At patient request, I called Royston Sinner at [phone] -reviewed results

## 2020-04-30 ENCOUNTER — Other Ambulatory Visit: Payer: Self-pay

## 2020-04-30 LAB — SURGICAL PATHOLOGY

## 2020-05-01 ENCOUNTER — Encounter: Payer: Self-pay | Admitting: Internal Medicine

## 2020-05-01 ENCOUNTER — Encounter (HOSPITAL_COMMUNITY): Payer: Self-pay | Admitting: Internal Medicine

## 2020-05-07 ENCOUNTER — Ambulatory Visit (INDEPENDENT_AMBULATORY_CARE_PROVIDER_SITE_OTHER): Payer: 59 | Admitting: Nurse Practitioner

## 2020-05-07 ENCOUNTER — Other Ambulatory Visit: Payer: Self-pay

## 2020-05-07 ENCOUNTER — Encounter: Payer: Self-pay | Admitting: Nurse Practitioner

## 2020-05-07 VITALS — BP 116/67 | HR 80 | Temp 97.0°F | Ht 64.0 in | Wt 105.4 lb

## 2020-05-07 DIAGNOSIS — Z0000 Encounter for general adult medical examination without abnormal findings: Secondary | ICD-10-CM | POA: Diagnosis not present

## 2020-05-07 MED ORDER — TRAZODONE HCL 100 MG PO TABS: 100.0000 mg | ORAL_TABLET | Freq: Every day | ORAL | 3 refills | Status: DC

## 2020-05-07 MED ORDER — LEVOTHYROXINE SODIUM 100 MCG PO TABS: 100.0000 ug | ORAL_TABLET | Freq: Every day | ORAL | 3 refills | Status: DC

## 2020-05-07 MED ORDER — LOPERAMIDE HCL 2 MG PO TABS: 2.0000 mg | ORAL_TABLET | Freq: Every morning | ORAL | 3 refills | Status: DC

## 2020-05-07 MED ORDER — VITAMIN D3 125 MCG (5000 UT) PO TABS: 5000.0000 [IU] | ORAL_TABLET | Freq: Every day | ORAL | 3 refills | Status: DC

## 2020-05-07 MED ORDER — CITALOPRAM HYDROBROMIDE 20 MG PO TABS: 20.0000 mg | ORAL_TABLET | Freq: Every day | ORAL | 3 refills | Status: DC

## 2020-05-07 MED ORDER — GABAPENTIN 300 MG PO CAPS: 300.0000 mg | ORAL_CAPSULE | Freq: Three times a day (TID) | ORAL | 3 refills | Status: DC

## 2020-05-07 MED ORDER — PRAVASTATIN SODIUM 40 MG PO TABS: 40.0000 mg | ORAL_TABLET | Freq: Every day | ORAL | 3 refills | Status: DC

## 2020-05-07 NOTE — Patient Instructions (Signed)
Health Maintenance, Female Adopting a healthy lifestyle and getting preventive care are important in promoting health and wellness. Ask your health care provider about:  The right schedule for you to have regular tests and exams.  Things you can do on your own to prevent diseases and keep yourself healthy. What should I know about diet, weight, and exercise? Eat a healthy diet   Eat a diet that includes plenty of vegetables, fruits, low-fat dairy products, and lean protein.  Do not eat a lot of foods that are high in solid fats, added sugars, or sodium. Maintain a healthy weight Body mass index (BMI) is used to identify weight problems. It estimates body fat based on height and weight. Your health care provider can help determine your BMI and help you achieve or maintain a healthy weight. Get regular exercise Get regular exercise. This is one of the most important things you can do for your health. Most adults should:  Exercise for at least 150 minutes each week. The exercise should increase your heart rate and make you sweat (moderate-intensity exercise).  Do strengthening exercises at least twice a week. This is in addition to the moderate-intensity exercise.  Spend less time sitting. Even light physical activity can be beneficial. Watch cholesterol and blood lipids Have your blood tested for lipids and cholesterol at 63 years of age, then have this test every 5 years. Have your cholesterol levels checked more often if:  Your lipid or cholesterol levels are high.  You are older than 63 years of age.  You are at high risk for heart disease. What should I know about cancer screening? Depending on your health history and family history, you may need to have cancer screening at various ages. This may include screening for:  Breast cancer.  Cervical cancer.  Colorectal cancer.  Skin cancer.  Lung cancer. What should I know about heart disease, diabetes, and high blood  pressure? Blood pressure and heart disease  High blood pressure causes heart disease and increases the risk of stroke. This is more likely to develop in people who have high blood pressure readings, are of African descent, or are overweight.  Have your blood pressure checked: ? Every 3-5 years if you are 18-39 years of age. ? Every year if you are 40 years old or older. Diabetes Have regular diabetes screenings. This checks your fasting blood sugar level. Have the screening done:  Once every three years after age 40 if you are at a normal weight and have a low risk for diabetes.  More often and at a younger age if you are overweight or have a high risk for diabetes. What should I know about preventing infection? Hepatitis B If you have a higher risk for hepatitis B, you should be screened for this virus. Talk with your health care provider to find out if you are at risk for hepatitis B infection. Hepatitis C Testing is recommended for:  Everyone born from 1945 through 1965.  Anyone with known risk factors for hepatitis C. Sexually transmitted infections (STIs)  Get screened for STIs, including gonorrhea and chlamydia, if: ? You are sexually active and are younger than 63 years of age. ? You are older than 63 years of age and your health care provider tells you that you are at risk for this type of infection. ? Your sexual activity has changed since you were last screened, and you are at increased risk for chlamydia or gonorrhea. Ask your health care provider if   you are at risk.  Ask your health care provider about whether you are at high risk for HIV. Your health care provider may recommend a prescription medicine to help prevent HIV infection. If you choose to take medicine to prevent HIV, you should first get tested for HIV. You should then be tested every 3 months for as long as you are taking the medicine. Pregnancy  If you are about to stop having your period (premenopausal) and  you may become pregnant, seek counseling before you get pregnant.  Take 400 to 800 micrograms (mcg) of folic acid every day if you become pregnant.  Ask for birth control (contraception) if you want to prevent pregnancy. Osteoporosis and menopause Osteoporosis is a disease in which the bones lose minerals and strength with aging. This can result in bone fractures. If you are 65 years old or older, or if you are at risk for osteoporosis and fractures, ask your health care provider if you should:  Be screened for bone loss.  Take a calcium or vitamin D supplement to lower your risk of fractures.  Be given hormone replacement therapy (HRT) to treat symptoms of menopause. Follow these instructions at home: Lifestyle  Do not use any products that contain nicotine or tobacco, such as cigarettes, e-cigarettes, and chewing tobacco. If you need help quitting, ask your health care provider.  Do not use street drugs.  Do not share needles.  Ask your health care provider for help if you need support or information about quitting drugs. Alcohol use  Do not drink alcohol if: ? Your health care provider tells you not to drink. ? You are pregnant, may be pregnant, or are planning to become pregnant.  If you drink alcohol: ? Limit how much you use to 0-1 drink a day. ? Limit intake if you are breastfeeding.  Be aware of how much alcohol is in your drink. In the [place] one drink equals one 12 oz bottle of beer (355 mL), one 5 oz glass of wine (148 mL), or one 1 oz glass of hard liquor (44 mL). General instructions  Schedule regular health, dental, and eye exams.  Stay current with your vaccines.  Tell your health care provider if: ? You often feel depressed. ? You have ever been abused or do not feel safe at home. Summary  Adopting a healthy lifestyle and getting preventive care are important in promoting health and wellness.  Follow your health care provider's instructions about healthy  diet, exercising, and getting tested or screened for diseases.  Follow your health care provider's instructions on monitoring your cholesterol and blood pressure. This information is not intended to replace advice given to you by your health care provider. Make sure you discuss any questions you have with your health care provider. Document Revised: 09/05/2018 Document Reviewed: 09/05/2018 Elsevier Patient Education  2020 Elsevier Inc.  

## 2020-05-07 NOTE — Addendum Note (Signed)
Addended by: Baldomero Lamy B on: 05/07/2020 12:41 PM   Modules accepted: Orders

## 2020-05-07 NOTE — Assessment & Plan Note (Addendum)
Patient is a 64 year old female visiting for a annual physical exam, head to toe assessment completed, pt has no new concerns.. we will continue to manage patients chronic conditions. Provided education to patient for health maintenance.  completed labs today, CBC, CMP, Lipid, TSH. Referral to Mammogram, patient is up to date with Pneumonia  vaccinations, Pap, and Colonoscopy.    Rx sent to Pharmacy  Pt will follow up in 1 year pending lab results.

## 2020-05-07 NOTE — Progress Notes (Addendum)
New Patient Office Visit  Subjective:  Patient ID: Erica Beasley, female    DOB: 1956-08-19  Age: 64 y.o. MRN: [id number]  CC:  Chief Complaint  Patient presents with  . Establish Care    HPI Erica Beasley presents for .   Encounter for general adult medical examination without abnormal findings  Physical:Patient's last physical exam was  year ago .  Weight: not appropriate for height (BMI less than 27%) ;  Blood Pressure: Normal (BP less than 120/80) ;  Medical History: Patient history reviewed ; Family history reviewed ;  Allergies Reviewed: No change in current allergies ;  Medications Reviewed: Medications reviewed - no changes ;  Lipids: Normal lipid levels ;  Smoking: Life-long smoker ; Pt quit 2018  Physical Activity: patient reports no physical exercises Alcohol/Drug Use: Is a non-drinker ; No illicit drug use ;  Patient is not afflicted from Stress Incontinence and Urge Incontinence  Safety: reviewed ; Patient wears a seat belt, has smoke detectors, has no carbon monoxide detectors, patient has no Guns., and wears sunscreen with extended sun exposure. Dental Care: last visit 2 years ago, brushes and flosses daily. Ophthalmology/Optometry: Annual visit.  Hearing loss: none Vision impairments: Glasses and history of cateract  Past Medical History:  Diagnosis Date  . COPD (chronic obstructive pulmonary disease) ([place])   . Depression   . Diabetes ([place])   . Hyperlipidemia   . Hypothyroidism   . Neuropathy   . Rheumatoid arthritis [facility])     Past Surgical History:  Procedure Laterality Date  . BIOPSY  09/16/2019   Procedure: BIOPSY;  Surgeon: Daneil Dolin, MD;  Location[facility];  Service: Endoscopy;;  . BIOPSY  04/28/2020   Procedure: BIOPSY;  Surgeon: Daneil Dolin, MD;  Location[facility];  Service: Endoscopy;;  . CATARACT EXTRACTION Bilateral   . COLONOSCOPY  09/2010   Dr. Posey Pronto: mild diverticulsis in sigmoid colon.   . COLONOSCOPY WITH PROPOFOL  N/A 09/16/2019   Rourk: Diverticulosis, random colon biopsies negative for microscopic colitis.  Marland Kitchen ESOPHAGOGASTRODUODENOSCOPY (EGD) WITH PROPOFOL N/A 04/28/2020   Procedure: ESOPHAGOGASTRODUODENOSCOPY (EGD) WITH PROPOFOL;  Surgeon: Daneil Dolin, MD;  Location[facility];  Service: Endoscopy;  Laterality: N/A;  2:15  . YAG LASER APPLICATION Left 39/76/7341   Procedure: YAG LASER APPLICATION;  Surgeon: Williams Che, MD;  Location: [facility];  Service: Ophthalmology;  Laterality: Left;    Family History  Problem Relation Age of Onset  . Stroke Mother   . Heart disease Mother   . Diabetes Mother   . Breast cancer Mother   . Other Father        stomach taken out for some reason  . Migraines Daughter   . Colon cancer Neg Hx     Social History   Socioeconomic History  . Marital status: Widowed    Spouse name: Not on file  . Number of children: 1  . Years of education: Not on file  . Highest education level: Not on file  Occupational History  . Occupation: DISABLED  Tobacco Use  . Smoking status: Former Smoker    Packs/day: 1.00    Years: 15.00    Pack years: 15.00    Types: Cigarettes    Quit date: 09/11/2017    Years since quitting: 2.6  . Smokeless tobacco: Former Network engineer  . Vaping Use: Never used  Substance and Sexual Activity  . Alcohol use: Never  .  Drug use: Never  . Sexual activity: Not Currently  Other Topics Concern  . Not on file  Social History Narrative  . Not on file   Social Determinants of Health   Financial Resource Strain:   . Difficulty of Paying Living Expenses:   Food Insecurity:   . Worried About Charity fundraiser in the Last Year:   . Arboriculturist in the Last Year:   Transportation Needs:   . Film/video editor (Medical):   Marland Kitchen Lack of Transportation (Non-Medical):   Physical Activity:   . Days of Exercise per Week:   . Minutes of Exercise per Session:   Stress:   . Feeling of Stress :   Social Connections:   .  Frequency of Communication with Friends and Family:   . Frequency of Social Gatherings with Friends and Family:   . Attends Religious Services:   . Active Member of Clubs or Organizations:   . Attends Archivist Meetings:   Marland Kitchen Marital Status:   Intimate Partner Violence:   . Fear of Current or Ex-Partner:   . Emotionally Abused:   Marland Kitchen Physically Abused:   . Sexually Abused:     ROS Review of Systems  Constitutional: Negative.   HENT: Negative.   Eyes: Negative.   Respiratory: Negative.   Cardiovascular: Negative.   Endocrine: Positive for cold intolerance. Negative for heat intolerance.  Genitourinary: Negative.   Musculoskeletal: Negative.   Skin: Negative.   Neurological: Negative.   Psychiatric/Behavioral: The patient is nervous/anxious.        Depression     Objective:   Today's Vitals: BP 116/67   Pulse 80   Temp (!) 97 F (36.1 C)   Ht 5\' 4"  (1.626 m)   Wt 105 lb 6.4 oz (47.8 kg)   SpO2 94%   BMI 18.09 kg/m   Physical Exam Constitutional:      Appearance: Normal appearance. She is ill-appearing.  HENT:     Head: Normocephalic.     Mouth/Throat:     Mouth: Mucous membranes are moist.  Eyes:     Conjunctiva/sclera: Conjunctivae normal.  Cardiovascular:     Rate and Rhythm: Normal rate and regular rhythm.     Pulses: Normal pulses.     Heart sounds: Normal heart sounds.  Pulmonary:     Effort: Pulmonary effort is normal.     Breath sounds: Normal breath sounds.  Abdominal:     General: Bowel sounds are normal.     Tenderness: There is abdominal tenderness.     Comments: nausea  Musculoskeletal:        General: Tenderness present.     Cervical back: Neck supple.  Skin:    General: Skin is dry.  Neurological:     Mental Status: She is alert and oriented to person, place, and time.     Assessment & Plan:  Annual physical exam Patient is a 64 year old female visiting for a annual physical exam, head to toe assessment completed, pt has no  new concerns.. we will continue to manage patients chronic conditions. Provided education to patient for health maintenance.  completed labs today, CBC, CMP, Lipid, TSH. Referral to Mammogram, patient is up to date with Pneumonia  vaccinations, Pap, and Colonoscopy.    Rx sent to Pharmacy  Pt will follow up in 1 year pending lab results.    Problem List Items Addressed This Visit    None    Visit Diagnoses  Annual physical exam    -  Primary      Outpatient Encounter Medications as of 05/07/2020  Medication Sig  . Cholecalciferol (VITAMIN D3) 125 MCG (5000 UT) TABS Take 5,000 Units by mouth daily.   . citalopram (CELEXA) 20 MG tablet Take 20 mg by mouth daily.  . ferrous sulfate 325 (65 FE) MG tablet Take 325 mg by mouth daily with breakfast.  . Ferrous Sulfate Dried (FERROUS SULFATE IRON PO) Take by mouth daily.   Marland Kitchen gabapentin (NEURONTIN) 300 MG capsule Take 300 mg by mouth 3 (three) times daily.   Marland Kitchen leflunomide (ARAVA) 20 MG tablet Take 20 mg by mouth daily.  Marland Kitchen levothyroxine (SYNTHROID) 100 MCG tablet Take 100 mcg by mouth daily before breakfast.   . loperamide (IMODIUM A-D) 2 MG tablet Take 2 mg by mouth in the morning. Take second dose if needed   . pantoprazole (PROTONIX) 40 MG tablet Take 1 tablet (40 mg total) by mouth daily before breakfast.  . pravastatin (PRAVACHOL) 40 MG tablet Take 40 mg by mouth at bedtime.   . traZODone (DESYREL) 100 MG tablet Take 100 mg by mouth at bedtime.  . hydroxychloroquine (PLAQUENIL) 200 MG tablet Take 200 mg by mouth 2 (two) times daily.  (Patient not taking: Reported on 05/07/2020)  . ondansetron (ZOFRAN ODT) 4 MG disintegrating tablet Take 1 tablet (4 mg total) by mouth every 6 (six) hours. (Patient not taking: Reported on 05/07/2020)   No facility-administered encounter medications on file as of 05/07/2020.    Follow-up: Return in about 1 year (around 05/07/2021).   Ivy Lynn, NP

## 2020-05-08 ENCOUNTER — Other Ambulatory Visit: Payer: Self-pay | Admitting: Nurse Practitioner

## 2020-05-08 ENCOUNTER — Telehealth: Payer: Self-pay | Admitting: Nurse Practitioner

## 2020-05-08 LAB — LIPID PANEL
Chol/HDL Ratio: 2.6 ratio (ref 0.0–4.4)
Cholesterol, Total: 110 mg/dL (ref 100–199)
HDL: 42 mg/dL (ref >39)
LDL Chol Calc (NIH): 50 mg/dL (ref 0–99)
Triglycerides: 92 mg/dL (ref 0–149)
VLDL Cholesterol Cal: 18 mg/dL (ref 5–40)

## 2020-05-08 LAB — CBC WITH DIFFERENTIAL/PLATELET
Basophils Absolute: 0 10*3/uL (ref 0.0–0.2)
Basos: 0 %
EOS (ABSOLUTE): 0.1 10*3/uL (ref 0.0–0.4)
Eos: 1 %
Hematocrit: 29 % — ABNORMAL LOW (ref 34.0–46.6)
Hemoglobin: 9.3 g/dL — ABNORMAL LOW (ref 11.1–15.9)
Immature Grans (Abs): 0 10*3/uL (ref 0.0–0.1)
Immature Granulocytes: 0 %
Lymphocytes Absolute: 1.8 10*3/uL (ref 0.7–3.1)
Lymphs: 34 %
MCH: 27.1 pg (ref 26.6–33.0)
MCHC: 32.1 g/dL (ref 31.5–35.7)
MCV: 85 fL (ref 79–97)
Monocytes Absolute: 0.7 10*3/uL (ref 0.1–0.9)
Monocytes: 13 %
Neutrophils Absolute: 2.7 10*3/uL (ref 1.4–7.0)
Neutrophils: 52 %
Platelets: 376 10*3/uL (ref 150–450)
RBC: 3.43 x10E6/uL — ABNORMAL LOW (ref 3.77–5.28)
RDW: 13.4 % (ref 11.7–15.4)
WBC: 5.2 10*3/uL (ref 3.4–10.8)

## 2020-05-08 LAB — TSH: TSH: 0.009 u[IU]/mL — ABNORMAL LOW (ref 0.450–4.500)

## 2020-05-08 MED ORDER — LEVOTHYROXINE SODIUM 75 MCG PO TABS: 75.0000 ug | ORAL_TABLET | Freq: Every day | ORAL | 0 refills | Status: DC

## 2020-05-08 NOTE — Addendum Note (Signed)
Addended by: Ivy Lynn on: 05/08/2020 04:53 PM   Modules accepted: Orders

## 2020-05-08 NOTE — Telephone Encounter (Signed)
Je,  Will you review and make sure there is no interaction with these medications to be concerned with. I will call the pharmacy and make them aware.

## 2020-05-08 NOTE — Telephone Encounter (Signed)
I fixed it, entered in error. Thank you

## 2020-05-11 ENCOUNTER — Other Ambulatory Visit: Payer: Self-pay

## 2020-05-11 ENCOUNTER — Ambulatory Visit (HOSPITAL_COMMUNITY)
Admission: RE | Admit: 2020-05-11 | Discharge: 2020-05-11 | Disposition: A | Discharge status: Home / Self Care | Payer: 59 | Source: Ambulatory Visit | Attending: Hematology | Admitting: Hematology

## 2020-05-11 ENCOUNTER — Other Ambulatory Visit: Payer: Self-pay | Admitting: Nurse Practitioner

## 2020-05-11 DIAGNOSIS — R634 Abnormal weight loss: Secondary | ICD-10-CM | POA: Diagnosis present

## 2020-05-11 LAB — POCT I-STAT CREATININE: Creatinine, Ser: 0.8 mg/dL (ref 0.44–1.00)

## 2020-05-11 IMAGING — CT CT ABD-PELV W/ CM
2 of 5 series · 11 of 36 positions shown, 13 images · IV contrast (Omnipaque or Isovue)
Comparison: [DATE]

CLINICAL DATA: History of COVID and unintentional 30 pound weight
loss

EXAM:
CT CHEST, ABDOMEN, AND PELVIS WITH CONTRAST
TECHNIQUE: Multidetector CT imaging of the chest, abdomen and pelvis was
performed following the standard protocol during bolus
administration of intravenous contrast.
CONTRAST:  75mL OMNIPAQUE IOHEXOL 300 MG/ML  SOLN

[Series 2: cap with · axial · 0.62mm/px · z∈[+968,+1463]mm · 8 of 123 slices shown, 10 images]
[im 12/123  mediastinal]
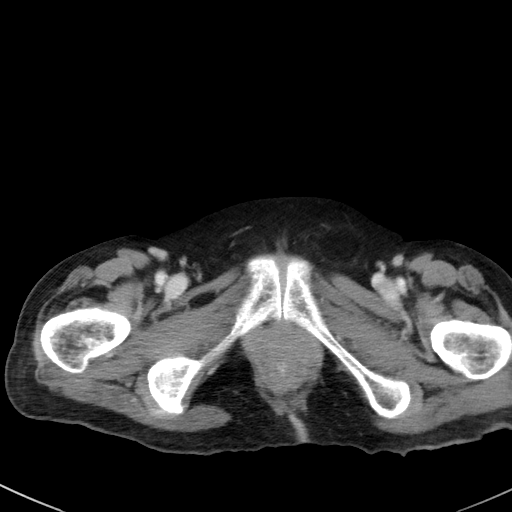
[im 12/123  lung]
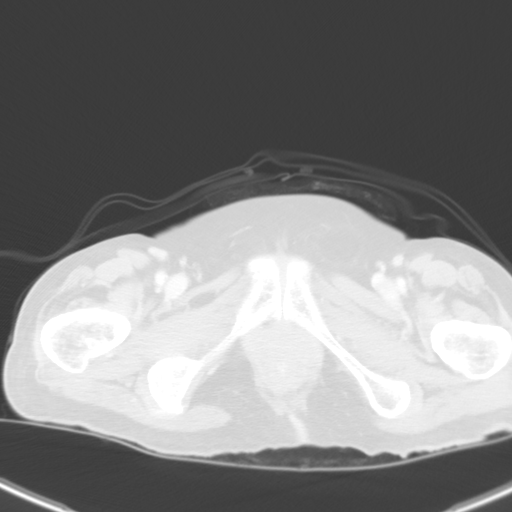
[im 23/123  lung]
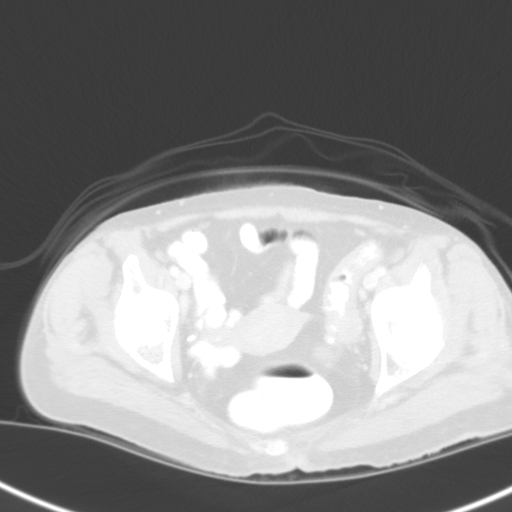
[im 45/123  lung]
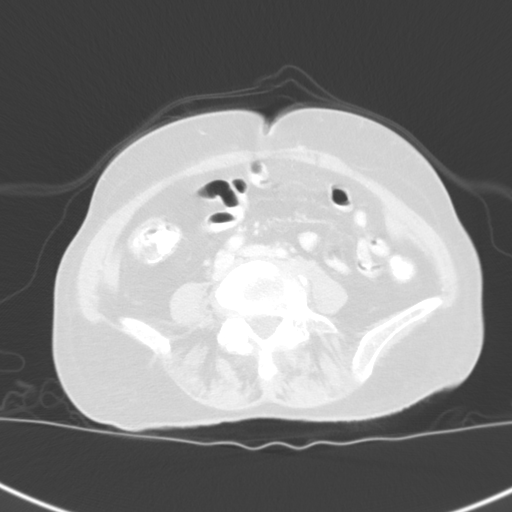
[im 56/123  lung]
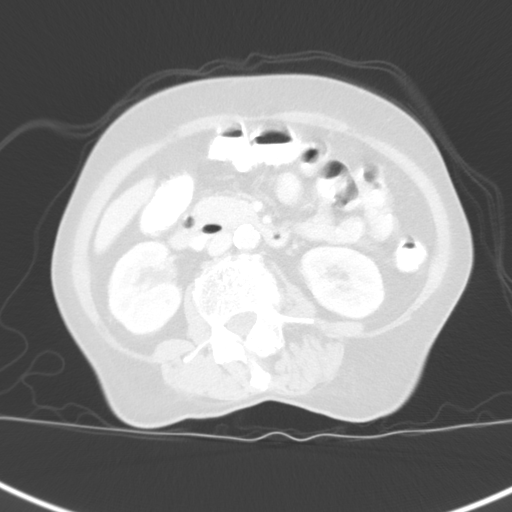
[im 67/123  mediastinal]
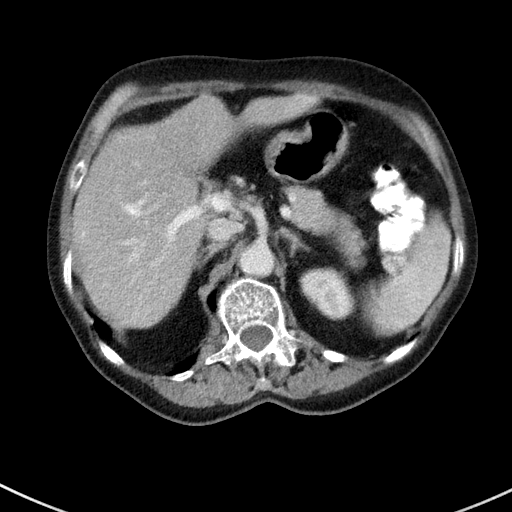
[im 67/123  lung]
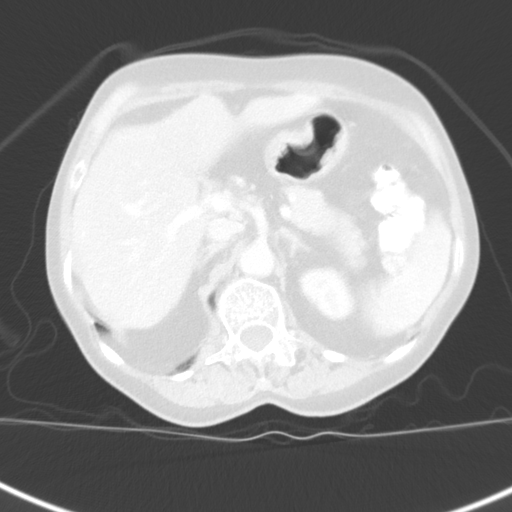
[im 78/123  lung]
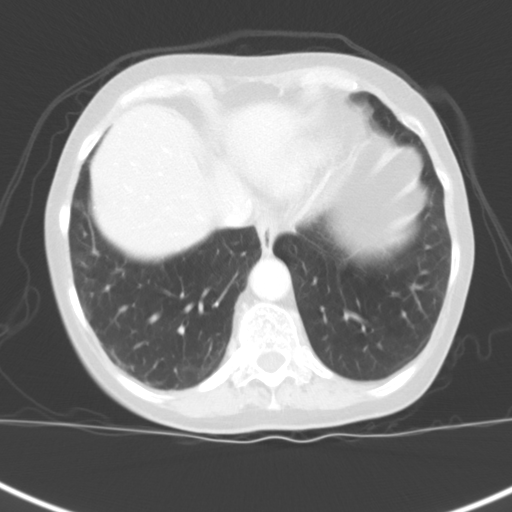
[im 100/123  lung]
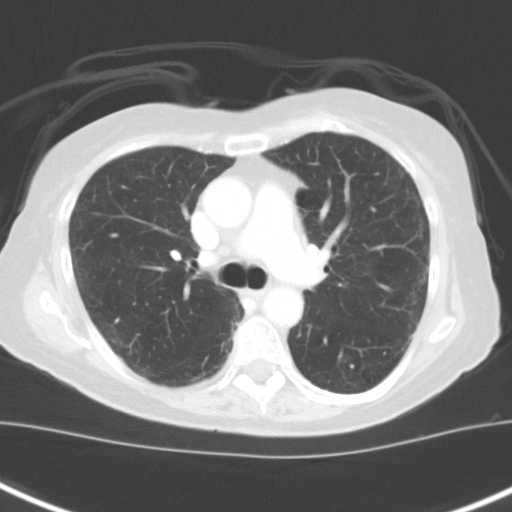
[im 111/123  lung]
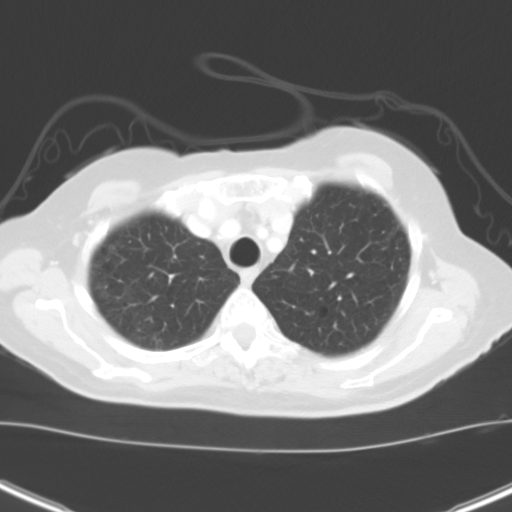

[Series 4: coronals · coronal · 0.60mm/px · 3 of 119 slices shown]
[im 24/119  lung]
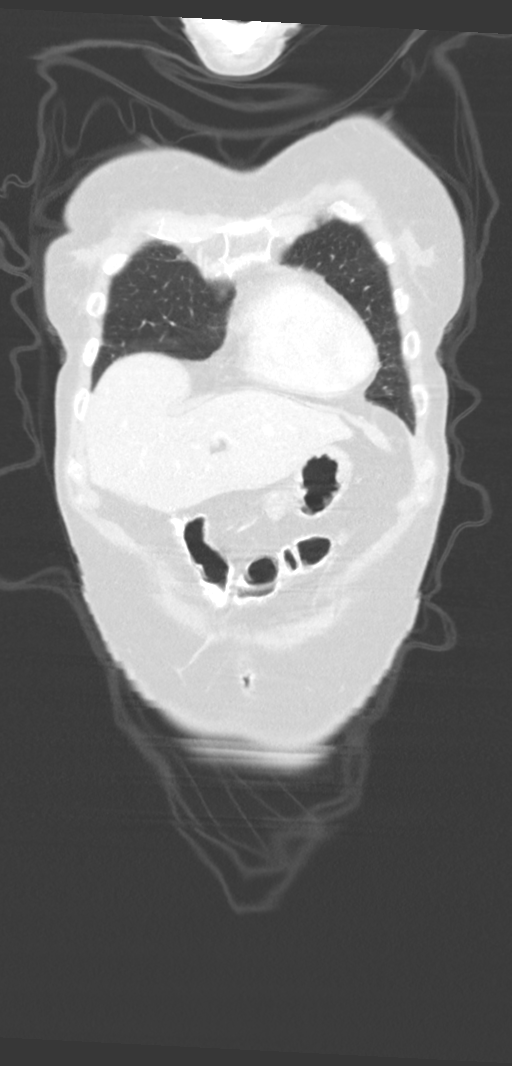
[im 48/119  lung]
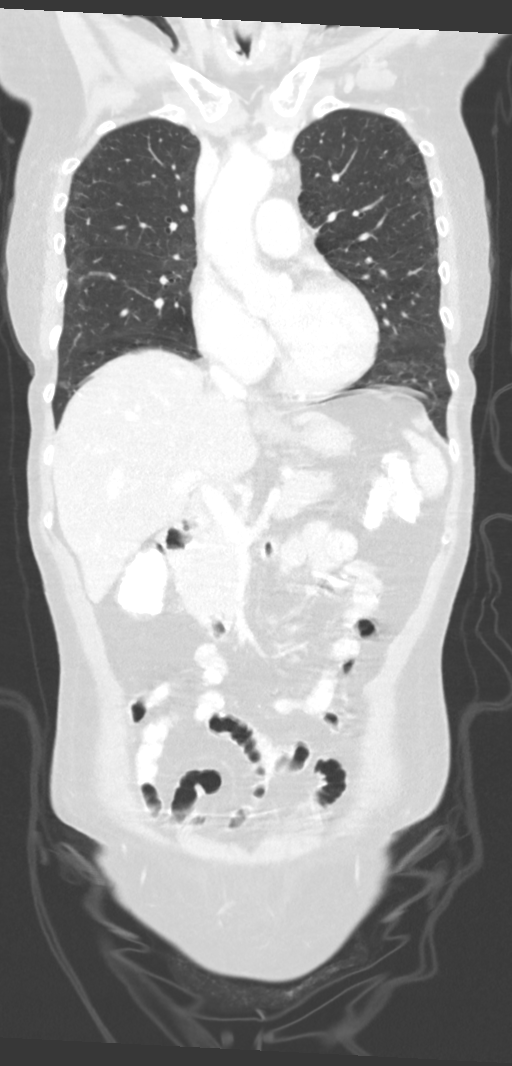
[im 71/119  lung]
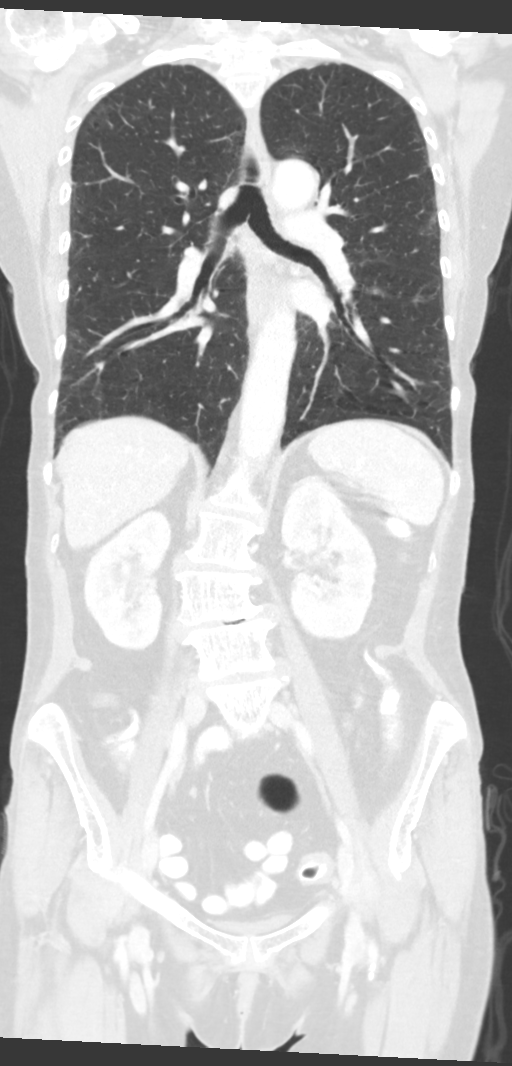

[11 of 36 positions shown; findings below may reference images not displayed]

FINDINGS: CT CHEST FINDINGS

Cardiovascular: Calcified atheromatous plaque and noncalcified
plaque, mild in the thoracic aorta which is nonaneurysmal. Heart
size is normal with signs of aortic annular calcification and aortic
valvular calcification. No pericardial fluid. Central pulmonary
vasculature is normal.

Mediastinum/Nodes: No mediastinal lymphadenopathy. Stable borderline
enlarged LEFT axillary lymph node approximately 1 cm. No hilar
lymphadenopathy. Esophagus grossly normal. Thoracic inlet structures
are normal.

Lungs/Pleura: Basilar atelectasis. No effusion. No dense
consolidation. Stable small juxtapleural nodule in the RIGHT upper
chest approximately 3 x 4 mm. Is is signs of centrilobular and mild
paraseptal emphysema worse at the lung apices. Pleural and
parenchymal scarring along the medial RIGHT chest with no change.

Musculoskeletal: Is see below for full musculoskeletal detail.

CT ABDOMEN PELVIS FINDINGS

Hepatobiliary: No focal, suspicious hepatic lesion. The hepatic
veins are patent. Portal veins are patent. No pericholecystic
stranding. No biliary duct dilation.

Pancreas: Pancreas is normal that ductal dilation or inflammation.

Spleen: Spleen normal in size and contour.

Adrenals/Urinary Tract: Adrenal glands are normal.

No hydronephrosis.  Symmetric renal enhancement.

Stomach/Bowel: Stomach under distended. No acute small bowel
process. Normal appendix.

Long segment colonic thickening involving the descending and sigmoid
colon, associated with diverticulosis, abutting the LEFT ovary but
without frank masslike features. No signs of surrounding stranding.
No free air. No abscess.

Vascular/Lymphatic: Calcified atheromatous plaque of the abdominal
aorta. No aneurysmal dilation. No adenopathy in the retroperitoneum.
No pelvic lymphadenopathy.

Reproductive: Uterus with unremarkable CT appearance. No adnexal
masses while the LEFT ovary is difficult to separate from the
adjacent colon does not have masslike features and there is no sign
of ascites.

Other: Moderately large fat containing LEFT inguinal hernia.

Musculoskeletal: Osteopenia and spinal degenerative changes with
dextroconvex scoliotic curvature of the lumbar spine. No acute bone
finding.
IMPRESSION: 1. Long segment colonic thickening likely diverticular disease. Low
level inflammation is possible but there is no surrounding
stranding. Correlation with recent colonoscopy results may be
helpful
2. Stable small juxtapleural nodule in the RIGHT upper chest. Benign
based on over 1 year stability.
3. Moderately large fat containing LEFT inguinal hernia.
4. Aortic atherosclerosis.
5. Emphysema and aortic valvular calcification.

Aortic Atherosclerosis ([P6]-[P6]) and Emphysema ([P6]-[P6]).

## 2020-05-11 MED ORDER — CITALOPRAM HYDROBROMIDE 10 MG PO TABS: 10.0000 mg | ORAL_TABLET | Freq: Every day | ORAL | 0 refills | Status: DC

## 2020-05-11 MED ORDER — IOHEXOL 300 MG/ML  SOLN
75.0000 mL | Freq: Once | INTRAMUSCULAR | Status: AC | PRN
  Administered 2020-05-11: 75 mL via INTRAVENOUS

## 2020-05-11 NOTE — Telephone Encounter (Signed)
Patient is no longer hydroxychloroquine

## 2020-05-11 NOTE — Telephone Encounter (Signed)
I sent Celexa 10 mg to pharmacy in [place]. Pt follow up in 4-6 weeks.  Caution patient not to take Celexa and trazodone at the same time.  Preferably trazodone at nighttime for sleep and Celexa during the day.

## 2020-05-11 NOTE — Telephone Encounter (Signed)
Patient aware.

## 2020-05-13 ENCOUNTER — Inpatient Hospital Stay (HOSPITAL_COMMUNITY): Payer: 59 | Attending: Hematology | Admitting: Hematology

## 2020-05-13 ENCOUNTER — Other Ambulatory Visit: Payer: Self-pay

## 2020-05-13 ENCOUNTER — Other Ambulatory Visit (HOSPITAL_COMMUNITY): Payer: Self-pay | Admitting: *Deleted

## 2020-05-13 VITALS — BP 140/77 | HR 88 | Temp 97.5°F | Resp 18 | Wt 102.1 lb

## 2020-05-13 DIAGNOSIS — R634 Abnormal weight loss: Secondary | ICD-10-CM | POA: Insufficient documentation

## 2020-05-13 DIAGNOSIS — R803 Bence Jones proteinuria: Secondary | ICD-10-CM | POA: Diagnosis not present

## 2020-05-13 DIAGNOSIS — Z803 Family history of malignant neoplasm of breast: Secondary | ICD-10-CM | POA: Diagnosis not present

## 2020-05-13 DIAGNOSIS — F329 Major depressive disorder, single episode, unspecified: Secondary | ICD-10-CM | POA: Insufficient documentation

## 2020-05-13 DIAGNOSIS — Z809 Family history of malignant neoplasm, unspecified: Secondary | ICD-10-CM | POA: Diagnosis not present

## 2020-05-13 DIAGNOSIS — D649 Anemia, unspecified: Secondary | ICD-10-CM | POA: Diagnosis present

## 2020-05-13 DIAGNOSIS — D472 Monoclonal gammopathy: Secondary | ICD-10-CM

## 2020-05-13 DIAGNOSIS — Z87891 Personal history of nicotine dependence: Secondary | ICD-10-CM | POA: Insufficient documentation

## 2020-05-13 DIAGNOSIS — Z8616 Personal history of COVID-19: Secondary | ICD-10-CM | POA: Diagnosis not present

## 2020-05-13 DIAGNOSIS — R197 Diarrhea, unspecified: Secondary | ICD-10-CM | POA: Diagnosis not present

## 2020-05-13 DIAGNOSIS — Z79899 Other long term (current) drug therapy: Secondary | ICD-10-CM | POA: Diagnosis not present

## 2020-05-13 DIAGNOSIS — M069 Rheumatoid arthritis, unspecified: Secondary | ICD-10-CM | POA: Diagnosis not present

## 2020-05-13 MED ORDER — DRONABINOL 2.5 MG PO CAPS
2.5000 mg | ORAL_CAPSULE | Freq: Two times a day (BID) | ORAL | 0 refills | Status: DC
Start: 2020-05-13 — End: 2020-05-13

## 2020-05-13 MED ORDER — DRONABINOL 2.5 MG PO CAPS: 2.5000 mg | ORAL_CAPSULE | Freq: Two times a day (BID) | ORAL | 0 refills | Status: DC

## 2020-05-13 MED ORDER — CITALOPRAM HYDROBROMIDE 20 MG PO TABS: 20.0000 mg | ORAL_TABLET | Freq: Every day | ORAL | 1 refills | Status: DC

## 2020-05-13 NOTE — Patient Instructions (Addendum)
Erica Beasley at [facility] Discharge Instructions  You were seen today by Dr. Delton Coombes. He went over your recent results. You will be prescribed Marinol to stimulate your diet. You will also be prescribed citalopram 20 mg daily. Drink at least 5 cans of Ensure Plus daily to improve your weight. Dr. Delton Coombes will see you back in 6 weeks for labs and follow up.   Thank you for choosing [facility] to provide your oncology and hematology care.  To afford each patient quality time with our provider, please arrive at least 15 minutes before your scheduled appointment time.   If you have a lab appointment with the [facility] please come in thru the Main Entrance and check in at the main information desk  You need to re-schedule your appointment should you arrive 10 or more minutes late.  We strive to give you quality time with our providers, and arriving late affects you and other patients whose appointments are after yours.  Also, if you no show three or more times for appointments you may be dismissed from the clinic at the providers discretion.     Again, thank you for choosing [facility].  Our hope is that these requests will decrease the amount of time that you wait before being seen by our physicians.       _____________________________________________________________  Should you have questions after your visit to [facility], please contact our office at [phone] between the hours of 8:00 a.m. and 4:30 p.m.  Voicemails left after 4:00 p.m. will not be returned until the following business day.  For prescription refill requests, have your pharmacy contact our office and allow 72 hours.    Cancer Center Support Programs:   > Cancer Support Group  2nd Tuesday of the month 1pm-2pm, Journey Room

## 2020-05-13 NOTE — Progress Notes (Signed)
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 [name] was last seen on 04/03/2020.  Today she is accompanied by her daughter. Her appetite is nonexistent and her taste is off, everything being too salty or bitter. She is not supplementing her diet. She complains of ongoing diarrhea. She has not started any new meds recently. She reports that she lost her taste and smell when she got COVID in October, but the taste and smell eventually returned and she started eating until this episode. She also reports feeling depressed.   REVIEW OF SYSTEMS:  Review of Systems  Constitutional: Positive for appetite change (depleted) and fatigue (depleted).  Respiratory: Positive for shortness of breath.   Gastrointestinal: Positive for diarrhea and nausea.  Psychiatric/Behavioral: Positive for depression.  All other systems reviewed and are negative.   PAST MEDICAL/SURGICAL HISTORY:  Past Medical History:  Diagnosis Date  . COPD (chronic obstructive pulmonary disease) ([place])   . Depression   . Diabetes ([place])   . Hyperlipidemia   . Hypothyroidism   . Neuropathy   . Rheumatoid arthritis [facility])    Past Surgical History:  Procedure Laterality Date  . BIOPSY  09/16/2019   Procedure: BIOPSY;  Surgeon: Daneil Dolin, MD;  Location: [facility];  Service: Endoscopy;;  . BIOPSY  04/28/2020   Procedure: BIOPSY;  Surgeon: Daneil Dolin, MD;  Location: [facility];  Service: Endoscopy;;  . CATARACT EXTRACTION Bilateral   . COLONOSCOPY  09/2010   Dr. Posey Pronto: mild diverticulsis in  sigmoid colon.   . COLONOSCOPY WITH PROPOFOL N/A 09/16/2019   Rourk: Diverticulosis, random colon biopsies negative for microscopic colitis.  Marland Kitchen ESOPHAGOGASTRODUODENOSCOPY (EGD) WITH PROPOFOL N/A 04/28/2020   Procedure: ESOPHAGOGASTRODUODENOSCOPY (EGD) WITH PROPOFOL;  Surgeon: Daneil Dolin, MD;  Location: [facility];  Service: Endoscopy;  Laterality: N/A;  2:15  . YAG LASER APPLICATION Left 33/82/5053   Procedure: YAG LASER APPLICATION;  Surgeon: Williams Che, MD;  Location: [facility] ORS;  Service: Ophthalmology;  Laterality: Left;    SOCIAL HISTORY:  Social History   Socioeconomic History  . Marital status: Widowed    Spouse name: Not on file  . Number of children: 1  . Years of education: Not on file  . Highest education level: Not on file  Occupational History  . Occupation: DISABLED  Tobacco Use  . Smoking status: Former Smoker    Packs/day: 1.00    Years: 15.00    Pack years: 15.00    Types: Cigarettes    Quit date: 09/11/2017    Years since quitting: 2.6  . Smokeless tobacco: Former Network engineer  . Vaping Use: Never used  Substance and Sexual Activity  . Alcohol use: Never  . Drug use: Never  . Sexual activity: Not Currently  Other Topics Concern  . Not on file  Social History Narrative  . Not on file   Social Determinants of Health   Financial Resource Strain:   . Difficulty of Paying Living Expenses:   Food Insecurity:   . Worried About Charity fundraiser in the Last  Year:   . Ran Out of Food in the Last Year:   Transportation Needs:   . Film/video editor (Medical):   Marland Kitchen Lack of Transportation (Non-Medical):   Physical Activity:   . Days of Exercise per Week:   . Minutes of Exercise per Session:   Stress:   . Feeling of Stress :   Social Connections:   . Frequency of Communication with Friends and Family:   . Frequency of Social Gatherings with Friends and Family:   . Attends Religious Services:   . Active Member of Clubs or Organizations:    . Attends Archivist Meetings:   Marland Kitchen Marital Status:   Intimate Partner Violence:   . Fear of Current or Ex-Partner:   . Emotionally Abused:   Marland Kitchen Physically Abused:   . Sexually Abused:     FAMILY HISTORY:  Family History  Problem Relation Age of Onset  . Stroke Mother   . Heart disease Mother   . Diabetes Mother   . Breast cancer Mother   . Other Father        stomach taken out for some reason  . Migraines Daughter   . Colon cancer Neg Hx     CURRENT MEDICATIONS:  Current Outpatient Medications  Medication Sig Dispense Refill  . Cholecalciferol (VITAMIN D3) 125 MCG (5000 UT) TABS Take 1 tablet (5,000 Units total) by mouth daily. 30 tablet 3  . citalopram (CELEXA) 10 MG tablet Take 1 tablet (10 mg total) by mouth daily. 60 tablet 0  . ferrous sulfate 325 (65 FE) MG tablet Take 325 mg by mouth daily with breakfast.    . Ferrous Sulfate Dried (FERROUS SULFATE IRON PO) Take by mouth daily.     Marland Kitchen gabapentin (NEURONTIN) 300 MG capsule Take 1 capsule (300 mg total) by mouth 3 (three) times daily. 90 capsule 3  . hydroxychloroquine (PLAQUENIL) 200 MG tablet Take 200 mg by mouth 2 (two) times daily.     Marland Kitchen leflunomide (ARAVA) 20 MG tablet Take 20 mg by mouth daily.    Marland Kitchen levothyroxine (SYNTHROID) 100 MCG tablet Take 100 mcg by mouth every morning.    . loperamide (IMODIUM A-D) 2 MG tablet Take 1 tablet (2 mg total) by mouth in the morning. Take second dose if needed 30 tablet 3  . pantoprazole (PROTONIX) 40 MG tablet Take 1 tablet (40 mg total) by mouth daily before breakfast. 90 tablet 1  . pravastatin (PRAVACHOL) 40 MG tablet Take 1 tablet (40 mg total) by mouth at bedtime. 30 tablet 3  . traZODone (DESYREL) 100 MG tablet Take 1 tablet (100 mg total) by mouth at bedtime. 30 tablet 3  . ondansetron (ZOFRAN ODT) 4 MG disintegrating tablet Take 1 tablet (4 mg total) by mouth every 6 (six) hours. (Patient not taking: Reported on 05/13/2020) 120 tablet 1   No current  facility-administered medications for this visit.    ALLERGIES:  No Known Allergies  PHYSICAL EXAM:  Performance status (ECOG): 1 - Symptomatic but completely ambulatory  Vitals:   05/13/20 1055  BP: 140/77  Pulse: 88  Resp: 18  Temp: (!) 97.5 F (36.4 C)  SpO2: 99%   Wt Readings from Last 3 Encounters:  05/13/20 102 lb 1.6 oz (46.3 kg)  05/07/20 105 lb 6.4 oz (47.8 kg)  04/08/20 107 lb 9.6 oz (48.8 kg)   Physical Exam Vitals reviewed.  Constitutional:      Appearance: Normal appearance.  Cardiovascular:     Rate and  Rhythm: Normal rate and regular rhythm.     Pulses: Normal pulses.     Heart sounds: Normal heart sounds.  Pulmonary:     Effort: Pulmonary effort is normal.     Breath sounds: Normal breath sounds.  Neurological:     Mental Status: She is alert.     LABORATORY DATA:  I have reviewed the labs as listed.  CBC Latest Ref Rng & Units 05/07/2020 04/03/2020 03/20/2020  WBC 3.4 - 10.8 x10E3/uL 5.2 6.4 7.5  Hemoglobin 11.1 - 15.9 g/dL 9.3(L) 9.8(L) 9.9(L)  Hematocrit 34.0 - 46.6 % 29.0(L) 33.0(L) 32.6(L)  Platelets 150 - 450 x10E3/uL 376 393 396   CMP Latest Ref Rng & Units 05/11/2020 04/03/2020 03/20/2020  Glucose 70 - 99 mg/dL - 151(H) 113(H)  BUN 8 - 23 mg/dL - 9 8  Creatinine 0.44 - 1.00 mg/dL 0.80 1.32(H) 1.08(H)  Sodium 135 - 145 mmol/L - 133(L) 136  Potassium 3.5 - 5.1 mmol/L - 3.6 3.4(L)  Chloride 98 - 111 mmol/L - 95(L) 99  CO2 22 - 32 mmol/L - 26 25  Calcium 8.9 - 10.3 mg/dL - 8.5(L) 8.5(L)  Total Protein 6.5 - 8.1 g/dL - 7.0 7.0  Total Bilirubin 0.3 - 1.2 mg/dL - 0.5 0.3  Alkaline Phos 38 - 126 U/L - 127(H) 130(H)  AST 15 - 41 U/L - 38 25  ALT 0 - 44 U/L - 16 12      Component Value Date/Time   RBC 3.43 (L) 05/07/2020 1246   RBC 3.53 (L) 04/03/2020 1026   RBC 3.56 (L) 04/03/2020 1026   MCV 85 05/07/2020 1246   MCH 27.1 05/07/2020 1246   MCH 27.8 04/03/2020 1026   MCHC 32.1 05/07/2020 1246   MCHC 29.7 (L) 04/03/2020 1026   RDW 13.4  05/07/2020 1246   LYMPHSABS 1.8 05/07/2020 1246   MONOABS 0.8 04/03/2020 1026   EOSABS 0.1 05/07/2020 1246   BASOSABS 0.0 05/07/2020 1246   Lab Results  Component Value Date   TIBC 231 (L) 03/10/2020   FERRITIN 700 (H) 04/03/2020   FERRITIN 527 (H) 03/10/2020   IRONPCTSAT 15 03/10/2020    DIAGNOSTIC IMAGING:  I have independently reviewed the scans and discussed with the patient. CT Chest W Contrast  Result Date: 05/11/2020 CLINICAL DATA:  History of COVID and unintentional 30 pound weight loss EXAM: CT CHEST, ABDOMEN, AND PELVIS WITH CONTRAST TECHNIQUE: Multidetector CT imaging of the chest, abdomen and pelvis was performed following the standard protocol during bolus administration of intravenous contrast. CONTRAST:  58mL OMNIPAQUE IOHEXOL 300 MG/ML  SOLN COMPARISON:  Feb 11, 2019 FINDINGS: CT CHEST FINDINGS Cardiovascular: Calcified atheromatous plaque and noncalcified plaque, mild in the thoracic aorta which is nonaneurysmal. Heart size is normal with signs of aortic annular calcification and aortic valvular calcification. No pericardial fluid. Central pulmonary vasculature is normal. Mediastinum/Nodes: No mediastinal lymphadenopathy. Stable borderline enlarged LEFT axillary lymph node approximately 1 cm. No hilar lymphadenopathy. Esophagus grossly normal. Thoracic inlet structures are normal. Lungs/Pleura: Basilar atelectasis. No effusion. No dense consolidation. Stable small juxtapleural nodule in the RIGHT upper chest approximately 3 x 4 mm. Is is signs of centrilobular and mild paraseptal emphysema worse at the lung apices. Pleural and parenchymal scarring along the medial RIGHT chest with no change. Musculoskeletal: Is see below for full musculoskeletal detail. CT ABDOMEN PELVIS FINDINGS Hepatobiliary: No focal, suspicious hepatic lesion. The hepatic veins are patent. Portal veins are patent. No pericholecystic stranding. No biliary duct dilation. Pancreas: Pancreas is normal that  ductal  dilation or inflammation. Spleen: Spleen normal in size and contour. Adrenals/Urinary Tract: Adrenal glands are normal. No hydronephrosis.  Symmetric renal enhancement. Stomach/Bowel: Stomach under distended. No acute small bowel process. Normal appendix. Long segment colonic thickening involving the descending and sigmoid colon, associated with diverticulosis, abutting the LEFT ovary but without frank masslike features. No signs of surrounding stranding. No free air. No abscess. Vascular/Lymphatic: Calcified atheromatous plaque of the abdominal aorta. No aneurysmal dilation. No adenopathy in the retroperitoneum. No pelvic lymphadenopathy. Reproductive: Uterus with unremarkable CT appearance. No adnexal masses while the LEFT ovary is difficult to separate from the adjacent colon does not have masslike features and there is no sign of ascites. Other: Moderately large fat containing LEFT inguinal hernia. Musculoskeletal: Osteopenia and spinal degenerative changes with dextroconvex scoliotic curvature of the lumbar spine. No acute bone finding. IMPRESSION: 1. Long segment colonic thickening likely diverticular disease. Low level inflammation is possible but there is no surrounding stranding. Correlation with recent colonoscopy results may be helpful 2. Stable small juxtapleural nodule in the RIGHT upper chest. Benign based on over 1 year stability. 3. Moderately large fat containing LEFT inguinal hernia. 4. Aortic atherosclerosis. 5. Emphysema and aortic valvular calcification. Aortic Atherosclerosis (ICD10-I70.0) and Emphysema (ICD10-J43.9). Electronically Signed   By: Zetta Bills M.D.   On: 05/11/2020 17:39   CT Abdomen Pelvis W Contrast  Result Date: 05/11/2020 CLINICAL DATA:  History of COVID and unintentional 30 pound weight loss EXAM: CT CHEST, ABDOMEN, AND PELVIS WITH CONTRAST TECHNIQUE: Multidetector CT imaging of the chest, abdomen and pelvis was performed following the standard protocol during bolus  administration of intravenous contrast. CONTRAST:  30mL OMNIPAQUE IOHEXOL 300 MG/ML  SOLN COMPARISON:  Feb 11, 2019 FINDINGS: CT CHEST FINDINGS Cardiovascular: Calcified atheromatous plaque and noncalcified plaque, mild in the thoracic aorta which is nonaneurysmal. Heart size is normal with signs of aortic annular calcification and aortic valvular calcification. No pericardial fluid. Central pulmonary vasculature is normal. Mediastinum/Nodes: No mediastinal lymphadenopathy. Stable borderline enlarged LEFT axillary lymph node approximately 1 cm. No hilar lymphadenopathy. Esophagus grossly normal. Thoracic inlet structures are normal. Lungs/Pleura: Basilar atelectasis. No effusion. No dense consolidation. Stable small juxtapleural nodule in the RIGHT upper chest approximately 3 x 4 mm. Is is signs of centrilobular and mild paraseptal emphysema worse at the lung apices. Pleural and parenchymal scarring along the medial RIGHT chest with no change. Musculoskeletal: Is see below for full musculoskeletal detail. CT ABDOMEN PELVIS FINDINGS Hepatobiliary: No focal, suspicious hepatic lesion. The hepatic veins are patent. Portal veins are patent. No pericholecystic stranding. No biliary duct dilation. Pancreas: Pancreas is normal that ductal dilation or inflammation. Spleen: Spleen normal in size and contour. Adrenals/Urinary Tract: Adrenal glands are normal. No hydronephrosis.  Symmetric renal enhancement. Stomach/Bowel: Stomach under distended. No acute small bowel process. Normal appendix. Long segment colonic thickening involving the descending and sigmoid colon, associated with diverticulosis, abutting the LEFT ovary but without frank masslike features. No signs of surrounding stranding. No free air. No abscess. Vascular/Lymphatic: Calcified atheromatous plaque of the abdominal aorta. No aneurysmal dilation. No adenopathy in the retroperitoneum. No pelvic lymphadenopathy. Reproductive: Uterus with unremarkable CT  appearance. No adnexal masses while the LEFT ovary is difficult to separate from the adjacent colon does not have masslike features and there is no sign of ascites. Other: Moderately large fat containing LEFT inguinal hernia. Musculoskeletal: Osteopenia and spinal degenerative changes with dextroconvex scoliotic curvature of the lumbar spine. No acute bone finding. IMPRESSION: 1. Long segment colonic thickening likely  diverticular disease. Low level inflammation is possible but there is no surrounding stranding. Correlation with recent colonoscopy results may be helpful 2. Stable small juxtapleural nodule in the RIGHT upper chest. Benign based on over 1 year stability. 3. Moderately large fat containing LEFT inguinal hernia. 4. Aortic atherosclerosis. 5. Emphysema and aortic valvular calcification. Aortic Atherosclerosis (ICD10-I70.0) and Emphysema (ICD10-J43.9). Electronically Signed   By: Zetta Bills M.D.   On: 05/11/2020 17:39     ASSESSMENT:  1.  Bence-Jones proteinuria: -24-hour urine done by Dr. Theador Hawthorne showed urine immunofixation positive for Bence-Jones proteinuria, kappa type.  24-hour total protein was 77 mg/day. -Free kappa light chains were 52.9, lambda light chains 29.8 with ratio of 1.78.  Serum immunofixation was unremarkable.  SPEP did not show any evidence of M spike. -Labs on 04/03/2020 shows negative immunofixation.  Kappa light chain 61.3, lambda light chains 32.3, ratio 1.9. -Skeletal survey on 04/03/2020 was negative for lytic lesions.  2.  Weight loss: -25 pound weight loss in the last 2 months. -She smoked cigarettes 1 pack/day for 46 years, and quit 3 years ago. -She reports that she cannot eat due to decreased appetite.  Also feels very nauseous on smelling of foods. -EGD on 04/28/2020 with stomach and duodenal biopsy negative. -CT CAP on 05/11/2020 showed long segment colonic thickening likely diverticular disease.  Stable small juxtapleural nodule in the right upper chest  benign over 1 year.  Moderate large fat-containing left inguinal hernia.  Emphysema and diverticular calcification.  3.  Normocytic anemia: -CBC on 03/20/2020 shows hemoglobin 9.9 with MCV of 93.  White count and platelets were normal. -Colonoscopy on 09/16/2019 shows diverticulosis in the entire examined colon.  Nonbleeding internal hemorrhoids.  4.  Rheumatoid arthritis: -She is on Plaquenil and Areva for the last 4 years.   PLAN:  1.  Bence-Jones proteinuria: -Free kappa light chains of 61.3, lambda light chains 32.3 and ratio of 1.90.  Immunofixation was unremarkable. -Skeletal survey on 04/03/2020 was negative. -Calcium was 8.5, creatinine 1.32 and albumin 3.0.  No clear evidence of "crab" features. -We will continue to monitor closely in 3 to 4 months.  If it is remaining stable we can switch her to 49-month visits.  2.  Weight loss: -We reviewed results of EGD.  We also reviewed CT scan results which are grossly normal.  She lost 5 pounds in the last 1 month. -We will start her on Marinol 2.5 mg twice daily. -I will see her back in 6 weeks for follow-up.  3.  Normocytic anemia: -Hemoglobin is 9.8 with MCV of 93.  Methylmalonic acid, folic acid were normal.  Ferritin was 700.  LDH was 186.  4.  Depression: -She is taking Celexa 20 mg daily for several years. -She is still feeling depressed.  I will increase Celexa to 40 mg daily.  Orders placed this encounter:  No orders of the defined types were placed in this encounter.    Derek Jack, MD [place] [phone]   I, Milinda Antis, am acting as a scribe for Dr. Sanda Linger.  I, Derek Jack MD, have reviewed the above documentation for accuracy and completeness, and I agree with the above.

## 2020-05-14 ENCOUNTER — Other Ambulatory Visit (HOSPITAL_COMMUNITY): Payer: Self-pay | Admitting: *Deleted

## 2020-05-14 ENCOUNTER — Encounter (HOSPITAL_COMMUNITY): Payer: Self-pay

## 2020-05-14 MED ORDER — CITALOPRAM HYDROBROMIDE 40 MG PO TABS: 20.0000 mg | ORAL_TABLET | Freq: Every day | ORAL | 0 refills | Status: DC

## 2020-05-14 MED ORDER — DRONABINOL 2.5 MG PO CAPS: 2.5000 mg | ORAL_CAPSULE | Freq: Two times a day (BID) | ORAL | 0 refills | Status: DC

## 2020-05-15 ENCOUNTER — Encounter (HOSPITAL_COMMUNITY): Payer: Self-pay

## 2020-05-18 ENCOUNTER — Other Ambulatory Visit (HOSPITAL_COMMUNITY): Payer: Self-pay | Admitting: *Deleted

## 2020-05-18 ENCOUNTER — Telehealth (HOSPITAL_COMMUNITY): Payer: Self-pay | Admitting: *Deleted

## 2020-05-18 ENCOUNTER — Other Ambulatory Visit (HOSPITAL_COMMUNITY): Payer: Self-pay

## 2020-05-18 MED ORDER — CITALOPRAM HYDROBROMIDE 40 MG PO TABS: 40.0000 mg | ORAL_TABLET | Freq: Every day | ORAL | 0 refills | Status: DC

## 2020-05-18 MED ORDER — DRONABINOL 2.5 MG PO CAPS: 2.5000 mg | ORAL_CAPSULE | Freq: Two times a day (BID) | ORAL | 0 refills | Status: DC

## 2020-05-18 NOTE — Telephone Encounter (Signed)
Per Dr. Delton Coombes, okay for patient to take 40 mg Celexa daily.  New prescription sent with correct instructions.

## 2020-05-31 ENCOUNTER — Telehealth: Payer: Self-pay | Admitting: Gastroenterology

## 2020-05-31 NOTE — Telephone Encounter (Signed)
Reviewed recent CTs done by Dr. Delton Coombes. She has long segment colonic wall thickening likely diverticular disease. Low level inflammation is possible. Last TCS in 2020. ?need repeat endoscopic evaluation.   Patient has upcoming ov with eric.   Erica Beasley.

## 2020-06-02 ENCOUNTER — Other Ambulatory Visit: Payer: Self-pay

## 2020-06-02 ENCOUNTER — Telehealth: Payer: Self-pay | Admitting: *Deleted

## 2020-06-02 ENCOUNTER — Encounter: Payer: Self-pay | Admitting: *Deleted

## 2020-06-02 ENCOUNTER — Ambulatory Visit: Payer: 59 | Admitting: Nurse Practitioner

## 2020-06-02 ENCOUNTER — Encounter: Payer: Self-pay | Admitting: Family Medicine

## 2020-06-02 VITALS — BP 118/79 | HR 83 | Temp 97.6°F | Ht 61.0 in | Wt 99.8 lb

## 2020-06-02 DIAGNOSIS — R935 Abnormal findings on diagnostic imaging of other abdominal regions, including retroperitoneum: Secondary | ICD-10-CM | POA: Insufficient documentation

## 2020-06-02 DIAGNOSIS — K529 Noninfective gastroenteritis and colitis, unspecified: Secondary | ICD-10-CM

## 2020-06-02 DIAGNOSIS — R112 Nausea with vomiting, unspecified: Secondary | ICD-10-CM | POA: Diagnosis not present

## 2020-06-02 DIAGNOSIS — R634 Abnormal weight loss: Secondary | ICD-10-CM

## 2020-06-02 MED ORDER — CREON 24000-76000 UNITS PO CPEP: ORAL_CAPSULE | ORAL | 2 refills | Status: DC

## 2020-06-02 NOTE — Telephone Encounter (Signed)
Called pt and left detailed message on VM with pre-op/covid test appt information

## 2020-06-02 NOTE — Telephone Encounter (Signed)
Noted. Patient scheduled for TCS. See OV note for details.

## 2020-06-02 NOTE — Patient Instructions (Signed)
Your health issues we discussed today were:   Persistent diarrhea with weight loss: 1. I have put in orders for stool studies.  Pick up your collection kit from the lab today and try to bring your stool studies back tomorrow 2. I have sent a prescription for Creon to your pharmacy.  Start taking Creon AFTER you have completed your stool studies 3. You will take 2 capsules of Creon with meals and 1 capsule with snacks 4. As we discussed, use Ensure, boost, or other protein drinks such as Premiere Protein, Muscle Milk, and others; aim for 2 protein drinks a day.  As we discussed, you can drink little bits throughout the day if you are unable to tolerate the entire bottle at once 5. We will schedule your colonoscopy for you.  Further recommendations will follow your colonoscopy 6. Call us for any worsening or severe symptoms  Overall I recommend:  1. Continue your other current medications 2. Return for follow-up in 4 weeks with the nurse to check your weight 3. Follow-up for an office visit in 2 months 4. Call us if you have any questions or concerns   At [facility] we value your feedback. You may receive a survey about your visit today. Please share your experience as we strive to create trusting relationships with our patients to provide genuine, compassionate, quality care.  We appreciate your understanding and patience as we review any laboratory studies, imaging, and other diagnostic tests that are ordered as we care for you. Our office policy is 5 business days for review of these results, and any emergent or urgent results are addressed in a timely manner for your best interest. If you do not hear from our office in 1 week, please contact us.   We also encourage the use of MyChart, which contains your medical information for your review as well. If you are not enrolled in this feature, an access code is on this after visit summary for your convenience. Thank you for  allowing [place] to be involved in your care.  It was great to see you today!  I hope you have a great rest of your summer and wonderful fall!!

## 2020-06-02 NOTE — Patient Instructions (Signed)
NETA UPADHYAY  06/02/2020     @PREFPERIOPPHARMACY @   Your procedure is scheduled on  06/08/2020  Report to [facility] at  [place].M.  Call this number if you have problems the morning of surgery:  [phone]   Remember:  Follow the diet and prep instructions given to you by the office.                      Take these medicines the morning of surgery with A SIP OF WATER  Celexa, gabapentin, plquenil, arava, levothyroxine, zofran(if needed), protonix. DO NOT take any medications for diabetes there morning of your procedure.    Do not wear jewelry, make-up or nail polish.  Do not wear lotions, powders, or perfumes. Please wear deodorant and brush your teeth.  Do not shave 48 hours prior to surgery.  Men may shave face and neck.  Do not bring valuables to the hospital.  [facility] is not responsible for any belongings or valuables.  Contacts, dentures or bridgework may not be worn into surgery.  Leave your suitcase in the car.  After surgery it may be brought to your room.  For patients admitted to the hospital, discharge time will be determined by your treatment team.  Patients discharged the day of surgery will not be allowed to drive home.   Name and phone number of your driver:   family Special instructions:  DO NOT smoke the morning of your procedure.  Please read over the following fact sheets that you were given. Anesthesia Post-op Instructions and Care and Recovery After Surgery       Colonoscopy, Adult, Care After This sheet gives you information about how to care for yourself after your procedure. Your health care provider may also give you more specific instructions. If you have problems or questions, contact your health care provider. What can I expect after the procedure? After the procedure, it is common to have:  A small amount of blood in your stool for 24 hours after the procedure.  Some gas.  Mild cramping or bloating of your  abdomen. Follow these instructions at home: Eating and drinking   Drink enough fluid to keep your urine pale yellow.  Follow instructions from your health care provider about eating or drinking restrictions.  Resume your normal diet as instructed by your health care provider. Avoid heavy or fried foods that are hard to digest. Activity  Rest as told by your health care provider.  Avoid sitting for a long time without moving. Get up to take short walks every 1-2 hours. This is important to improve blood flow and breathing. Ask for help if you feel weak or unsteady.  Return to your normal activities as told by your health care provider. Ask your health care provider what activities are safe for you. Managing cramping and bloating   Try walking around when you have cramps or feel bloated.  Apply heat to your abdomen as told by your health care provider. Use the heat source that your health care provider recommends, such as a moist heat pack or a heating pad. ? Place a towel between your skin and the heat source. ? Leave the heat on for 20-30 minutes. ? Remove the heat if your skin turns bright red. This is especially important if you are unable to feel pain, heat, or cold. You may have a greater risk of getting burned. General instructions  For the first 24 hours after the procedure: ? Do not drive or use machinery. ? Do not sign important documents. ? Do not drink alcohol. ? Do your regular daily activities at a slower pace than normal. ? Eat soft foods that are easy to digest.  Take over-the-counter and prescription medicines only as told by your health care provider.  Keep all follow-up visits as told by your health care provider. This is important. Contact a health care provider if:  You have blood in your stool 2-3 days after the procedure. Get help right away if you have:  More than a small spotting of blood in your stool.  Large blood clots in your stool.  Swelling  of your abdomen.  Nausea or vomiting.  A fever.  Increasing pain in your abdomen that is not relieved with medicine. Summary  After the procedure, it is common to have a small amount of blood in your stool. You may also have mild cramping and bloating of your abdomen.  For the first 24 hours after the procedure, do not drive or use machinery, sign important documents, or drink alcohol.  Get help right away if you have a lot of blood in your stool, nausea or vomiting, a fever, or increased pain in your abdomen. This information is not intended to replace advice given to you by your health care provider. Make sure you discuss any questions you have with your health care provider. Document Revised: 04/08/2019 Document Reviewed: 04/08/2019 Elsevier Patient Education  [place] After These instructions provide you with information about caring for yourself after your procedure. Your health care provider may also give you more specific instructions. Your treatment has been planned according to current medical practices, but problems sometimes occur. Call your health care provider if you have any problems or questions after your procedure. What can I expect after the procedure? After your procedure, you may:  Feel sleepy for several hours.  Feel clumsy and have poor balance for several hours.  Feel forgetful about what happened after the procedure.  Have poor judgment for several hours.  Feel nauseous or vomit.  Have a sore throat if you had a breathing tube during the procedure. Follow these instructions at home: For at least 24 hours after the procedure:      Have a responsible adult stay with you. It is important to have someone help care for you until you are awake and alert.  Rest as needed.  Do not: ? Participate in activities in which you could fall or become injured. ? Drive. ? Use heavy machinery. ? Drink alcohol. ? Take  sleeping pills or medicines that cause drowsiness. ? Make important decisions or sign legal documents. ? Take care of children on your own. Eating and drinking  Follow the diet that is recommended by your health care provider.  If you vomit, drink water, juice, or soup when you can drink without vomiting.  Make sure you have little or no nausea before eating solid foods. General instructions  Take over-the-counter and prescription medicines only as told by your health care provider.  If you have sleep apnea, surgery and certain medicines can increase your risk for breathing problems. Follow instructions from your health care provider about wearing your sleep device: ? Anytime you are sleeping, including during daytime naps. ? While taking prescription pain medicines, sleeping medicines, or medicines that make you drowsy.  If you smoke, do not smoke without supervision.  Keep  all follow-up visits as told by your health care provider. This is important. Contact a health care provider if:  You keep feeling nauseous or you keep vomiting.  You feel light-headed.  You develop a rash.  You have a fever. Get help right away if:  You have trouble breathing. Summary  For several hours after your procedure, you may feel sleepy and have poor judgment.  Have a responsible adult stay with you for at least 24 hours or until you are awake and alert. This information is not intended to replace advice given to you by your health care provider. Make sure you discuss any questions you have with your health care provider. Document Revised: 12/11/2017 Document Reviewed: 01/03/2016 Elsevier Patient Education  [place].

## 2020-06-02 NOTE — Progress Notes (Signed)
Referring Provider: Loman Brooklyn, FNP Primary Care Physician:  Loman Brooklyn, FNP Primary GI:  Dr. Gala Romney  Chief Complaint  Patient presents with  . Diarrhea    still on going daily, takes imodium to help. If she does not take it then will have about 10 episodes    HPI:   Erica Beasley is a 64 y.o. female who presents for follow-up on diarrhea.  The patient last seen in our office 04/08/2020 for chronic diarrhea, weight loss, non-intractable nausea.  History of rheumatoid arthritis, COPD, diabetes.  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  Improved in August 2020 but not back to baseline.  Colonoscopy in December 2020 with diverticulosis and random colon biopsies negative for microscopic colitis.  Recommended repeat colonoscopy in 5 years.  Repeat C. difficile labs in December also negative.  Started on empiric Creon because of the fatty nature of her stools.  Weight loss from 136 pounds in December 20 20 to 107 pounds at her last visit.  Scheduled for CT chest, abdomen, pelvis by oncology.  Bone survey negative for focal lytic or blastic lesions.  At her last visit noted previous 3 to 4 days with 10 bowel movements that are yellow liquid/bubbles, no nocturnal stools or formed bowel movements.  Imodium helps for about 24 hours.  No hematochezia or melena.  Stools appear fatty, foul-smelling.  Vomiting about twice a week without GERD symptoms.  Symptoms started abruptly about 2 months prior.  Had Covid in 2020 and has not fully recovered her sense of taste or smell.  Notes that the smell of her food makes her sick.  Also noted return of fatigue recently after Covid.  Not consuming much solids, mostly Jell-O, yogurt, popsicles.  Stop Creon since she is not taking solid foods.  Recommended pantoprazole 40 mg daily, Zofran every 6 hours as needed, encouraged to add back soft foods with protein and good nutritional value to help  maintain weight.  Consider EGD with propofol as next step if CTs are unremarkable.  Encouraged her to take 2 mg of Imodium every morning and a second dose in the afternoon if needed.  Add back Creon as solid foods are incorporated.  Continue to monitor weight 3 times a week and call for any worsening weight loss.  Zofran was denied by insurance.  The patient was provided with a GoodRx card with the monthly cost of $18.64.  Pharmacy informed the patient that there is an interaction between Zofran and Plaquenil.  Recommended hold off if she can manage, consider scopolamine if necessary.  EGD was completed 04/28/2020 which found normal esophagus, normal stomach, duodenal erosions status post gastric and duodenal biopsy.  Surgical otology found the biopsies to be antral and oxyntic mucosa with slight chronic inflammation but negative for H. pylori.  Duodenal biopsy with benign duodenal mucosa.  CT of the chest, abdomen, pelvis completed 05/11/2020.  Findings included long segment colonic thickening likely diverticular disease with possible low-level inflammation but no surrounding stranding and correlation with recent colonoscopy may be helpful.  Small stable juxtapleural nodule in the right upper chest that is benign based on 1 year stability, moderately large fat-containing left inguinal hernia, aortic atherosclerosis, emphysema and aortic valvular calcification.  There is a question of any need for repeat colonoscopy, the last one was completed in 2020.  Today she states she is doing okay overall. Still with diarrhea that she describes as chronic over the past  4 years, initially started when she had C. Diff; states she's had it ever since. Cannot discern any recent worsening to trigger her to seek care again. Stools are about 4-5 times in the morning and then will take Imodium and have no further stools. Weight is down another 8 lbs since last visit 2 months ago. Has to take Imodium to be able to go out. States  taste/smell has come back a little bit. Is eating a little bit recently. Typical diet includes cereal and banana or gravy. Later in the day peanut butter occasionally. Will have tossed salads as well. Not able to eat much meat. Does eat spaghetti at times. Tried Ensure but she got sick (though it was out of date). No more N/V. Denies abdominal pain. Still with occasional/rare GERD, diet dependent, on Protonix (admits large glass of OJ every morning). Denies hematochezia, melena, fever, chills. Denies URI or flu-like symptoms. Denies loss of sense of taste or smell. The patient has received COVID-19 vaccination(s). However was having a fever and was told they couldn't give her the second dose. Now is scared to get the second dose. Denies chest pain, dyspnea, dizziness, lightheadedness, syncope, near syncope. Denies any other upper or lower GI symptoms.  Past Medical History:  Diagnosis Date  . COPD (chronic obstructive pulmonary disease) ([place])   . COVID-19 06/2019  . Depression   . Diabetes ([place])   . Hyperlipidemia   . Hypothyroidism   . Neuropathy   . Rheumatoid arthritis [facility])     Past Surgical History:  Procedure Laterality Date  . BIOPSY  09/16/2019   Procedure: BIOPSY;  Surgeon: Daneil Dolin, MD;  Location: [facility];  Service: Endoscopy;;  . BIOPSY  04/28/2020   Procedure: BIOPSY;  Surgeon: Daneil Dolin, MD;  Location: [facility];  Service: Endoscopy;;  . CATARACT EXTRACTION Bilateral   . COLONOSCOPY  09/2010   Dr. Posey Pronto: mild diverticulsis in sigmoid colon.   . COLONOSCOPY WITH PROPOFOL N/A 09/16/2019   Rourk: Diverticulosis, random colon biopsies negative for microscopic colitis.  Marland Kitchen ESOPHAGOGASTRODUODENOSCOPY (EGD) WITH PROPOFOL N/A 04/28/2020   Procedure: ESOPHAGOGASTRODUODENOSCOPY (EGD) WITH PROPOFOL;  Surgeon: Daneil Dolin, MD;  Location: [facility];  Service: Endoscopy;  Laterality: N/A;  2:15  . YAG LASER APPLICATION Left 23/55/7322   Procedure: YAG LASER  APPLICATION;  Surgeon: Williams Che, MD;  Location: [facility] ORS;  Service: Ophthalmology;  Laterality: Left;    Current Outpatient Medications  Medication Sig Dispense Refill  . Cholecalciferol (VITAMIN D3) 125 MCG (5000 UT) TABS Take 1 tablet (5,000 Units total) by mouth daily. 30 tablet 3  . citalopram (CELEXA) 40 MG tablet Take 1 tablet (40 mg total) by mouth daily. 30 tablet 0  . dronabinol (MARINOL) 2.5 MG capsule Take 1 capsule (2.5 mg total) by mouth 2 (two) times daily before a meal. 60 capsule 0  . ferrous sulfate 325 (65 FE) MG tablet Take 325 mg by mouth daily with breakfast.    . Ferrous Sulfate Dried (FERROUS SULFATE IRON PO) Take by mouth daily.     Marland Kitchen gabapentin (NEURONTIN) 300 MG capsule Take 1 capsule (300 mg total) by mouth 3 (three) times daily. 90 capsule 3  . hydroxychloroquine (PLAQUENIL) 200 MG tablet Take 200 mg by mouth 2 (two) times daily.     Marland Kitchen leflunomide (ARAVA) 20 MG tablet Take 20 mg by mouth daily.    Marland Kitchen levothyroxine (SYNTHROID) 100 MCG tablet Take 100 mcg by mouth every morning.    Marland Kitchen  loperamide (IMODIUM A-D) 2 MG tablet Take 1 tablet (2 mg total) by mouth in the morning. Take second dose if needed 30 tablet 3  . ondansetron (ZOFRAN ODT) 4 MG disintegrating tablet Take 1 tablet (4 mg total) by mouth every 6 (six) hours. 120 tablet 1  . pantoprazole (PROTONIX) 40 MG tablet Take 1 tablet (40 mg total) by mouth daily before breakfast. 90 tablet 1  . pravastatin (PRAVACHOL) 40 MG tablet Take 1 tablet (40 mg total) by mouth at bedtime. 30 tablet 3  . traZODone (DESYREL) 100 MG tablet Take 1 tablet (100 mg total) by mouth at bedtime. 30 tablet 3   No current facility-administered medications for this visit.    Allergies as of 06/02/2020  . (No Known Allergies)    Family History  Problem Relation Age of Onset  . Stroke Mother   . Heart disease Mother   . Diabetes Mother   . Breast cancer Mother   . Other Father        stomach taken out for some reason  .  Migraines Daughter   . Colon cancer Neg Hx     Social History   Socioeconomic History  . Marital status: Widowed    Spouse name: Not on file  . Number of children: 1  . Years of education: Not on file  . Highest education level: Not on file  Occupational History  . Occupation: DISABLED  Tobacco Use  . Smoking status: Former Smoker    Packs/day: 1.00    Years: 15.00    Pack years: 15.00    Types: Cigarettes    Quit date: 09/11/2017    Years since quitting: 2.7  . Smokeless tobacco: Former Network engineer  . Vaping Use: Never used  Substance and Sexual Activity  . Alcohol use: Never  . Drug use: Never  . Sexual activity: Not Currently  Other Topics Concern  . Not on file  Social History Narrative  . Not on file   Social Determinants of Health   Financial Resource Strain:   . Difficulty of Paying Living Expenses: Not on file  Food Insecurity:   . Worried About Charity fundraiser in the Last Year: Not on file  . Ran Out of Food in the Last Year: Not on file  Transportation Needs:   . Lack of Transportation (Medical): Not on file  . Lack of Transportation (Non-Medical): Not on file  Physical Activity:   . Days of Exercise per Week: Not on file  . Minutes of Exercise per Session: Not on file  Stress:   . Feeling of Stress : Not on file  Social Connections:   . Frequency of Communication with Friends and Family: Not on file  . Frequency of Social Gatherings with Friends and Family: Not on file  . Attends Religious Services: Not on file  . Active Member of Clubs or Organizations: Not on file  . Attends Archivist Meetings: Not on file  . Marital Status: Not on file    Subjective: Review of Systems  Constitutional: Positive for weight loss. Negative for chills, fever and malaise/fatigue.  HENT: Negative for congestion and sore throat.   Respiratory: Negative for cough and shortness of breath.   Cardiovascular: Negative for chest pain and palpitations.   Gastrointestinal: Positive for diarrhea. Negative for abdominal pain, blood in stool, melena, nausea and vomiting.  Musculoskeletal: Negative for joint pain and myalgias.  Skin: Negative for rash.  Neurological: Negative for dizziness  and weakness.  Endo/Heme/Allergies: Does not bruise/bleed easily.  Psychiatric/Behavioral: Negative for depression. The patient is not nervous/anxious.   All other systems reviewed and are negative.    Objective: BP 118/79   Pulse 83   Temp 97.6 F (36.4 C)   Ht _0  (1.549 m)   Wt 99 lb 12.8 oz (45.3 kg)   BMI 18.86 kg/m  Physical Exam Vitals and nursing note reviewed.  Constitutional:      General: She is not in acute distress.    Appearance: Normal appearance. She is well-developed and underweight. She is not ill-appearing, toxic-appearing or diaphoretic.  HENT:     Head: Normocephalic and atraumatic.     Nose: No congestion or rhinorrhea.  Eyes:     General: No scleral icterus. Cardiovascular:     Rate and Rhythm: Normal rate and regular rhythm.     Heart sounds: Normal heart sounds.  Pulmonary:     Effort: Pulmonary effort is normal. No respiratory distress.     Breath sounds: Examination of the right-lower field reveals wheezing. Examination of the left-lower field reveals wheezing. Wheezing present.     Comments: Mild end-expiratory wheezes noted consistent with COPD history. Abdominal:     General: Bowel sounds are normal.     Palpations: Abdomen is soft. There is no hepatomegaly, splenomegaly or mass.     Tenderness: There is no abdominal tenderness. There is no guarding or rebound.     Hernia: No hernia is present.  Skin:    General: Skin is warm and dry.     Coloration: Skin is not jaundiced.     Findings: No rash.  Neurological:     General: No focal deficit present.     Mental Status: She is alert and oriented to person, place, and time.  Psychiatric:        Attention and Perception: Attention normal.        Mood and  Affect: Mood normal.        Speech: Speech normal.        Behavior: Behavior normal.        Thought Content: Thought content normal.        Cognition and Memory: Cognition and memory normal.      Assessment:  Very pleasant 64 year old female with history of chronic diarrhea as per HPI.  More concerning is that she has had progressive weight loss over the past year and is now only 99.8 pounds (previously 135 pounds last year).  As per HPI and previous office visit in July notes frequent loose stools that are yellow, seem oily.  Was confused by instructions to restart Creon and is not currently on it despite taking in solid foods.  She has been started on Marinol to help with her appetite.  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  Had a CT of the abdomen, pelvis, chest which only found colon wall thickening could be due to diverticulosis, but no definitive diagnosis.  She did have a colonoscopy a year ago but this is just for surveillance for colon polyps.  She tried taking Ensure, but the one she had was out of date and it made her sick.    Diarrhea with weight loss: Cannot eliminate possibility of recurrent colon infection.  Also possible exocrine pancreatic insufficiency given her description of her stools.  Cannot rule out more insidious pathology with her mildly abnormal CT, although her recent colonoscopy is reassuring.  At this point I will check fecal  elastase, stool studies.  I will have her restart her Creon when she has completed her stool studies and have sent a new prescription.  We will plan for colonoscopy to reevaluate.  Recommend Ensure supplementation.  I will have her follow-up in 4 weeks with the nurse for a weight check and in 2 months in our office for office visit.  Proceed with colonoscopy on propofol/MAC by Dr. Gala Romney in near future: the risks, benefits, and alternatives have been discussed with the patient in detail. The patient states understanding and  desires to proceed.  The patient is currently on trazodone, Neurontin, Marinol, Celexa. The patient is not on any other anticoagulants, anxiolytics, chronic pain medications, antidepressants, antidiabetics, or iron supplements.  We will plan for procedure on propofol/MAC to promote adequate sedation as was done for her last colonoscopy  ASA III   Plan: 1. Pancreatic fecal elastase and stool studies ordered 2. After completing stool studies, restart Creon with weight-based dosing (48,000 units per meal, 24,000 units per snack initially) 3. Ensure or other protein drinks supplementation aim for at least 2 a day 4. Colonoscopy to further evaluate for abnormal findings on CT as well as chronic diarrhea now with significant and progressive weight loss 5. Nurse visit in 4 weeks for weight check 6. Follow-up in 2 months for office visit   Thank you for allowing [place] to participate in the care of [place], DNP, AGNP-C Adult & Gerontological Nurse Practitioner [facility]   06/02/2020 11:05 AM   Disclaimer: This note was dictated with voice recognition software. Similar sounding words can inadvertently be transcribed and may not be corrected upon review.

## 2020-06-02 NOTE — Addendum Note (Signed)
Addended by: Gordy Levan, Yony Roulston A on: 06/02/2020 11:32 AM   Modules accepted: Orders

## 2020-06-02 NOTE — H&P (View-Only) (Signed)
  Referring Provider: Joyce, Britney F, FNP Primary Care Physician:  Joyce, Britney F, FNP Primary GI:  Dr. Rourk  Chief Complaint  Patient presents with  . Diarrhea    still on going daily, takes imodium to help. If she does not take it then will have about 10 episodes    HPI:   Erica Beasley is a 64 y.o. female who presents for follow-up on diarrhea.  The patient last seen in our office 04/08/2020 for chronic diarrhea, weight loss, non-intractable nausea.  History of rheumatoid arthritis, COPD, diabetes.  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  Improved in August 2020 but not back to baseline.  Colonoscopy in December 2020 with diverticulosis and random colon biopsies negative for microscopic colitis.  Recommended repeat colonoscopy in 5 years.  Repeat C. difficile labs in December also negative.  Started on empiric Creon because of the fatty nature of her stools.  Weight loss from 136 pounds in December 20 20 to 107 pounds at her last visit.  Scheduled for CT chest, abdomen, pelvis by oncology.  Bone survey negative for focal lytic or blastic lesions.  At her last visit noted previous 3 to 4 days with 10 bowel movements that are yellow liquid/bubbles, no nocturnal stools or formed bowel movements.  Imodium helps for about 24 hours.  No hematochezia or melena.  Stools appear fatty, foul-smelling.  Vomiting about twice a week without GERD symptoms.  Symptoms started abruptly about 2 months prior.  Had Covid in 2020 and has not fully recovered her sense of taste or smell.  Notes that the smell of her food makes her sick.  Also noted return of fatigue recently after Covid.  Not consuming much solids, mostly Jell-O, yogurt, popsicles.  Stop Creon since she is not taking solid foods.  Recommended pantoprazole 40 mg daily, Zofran every 6 hours as needed, encouraged to add back soft foods with protein and good nutritional value to help  maintain weight.  Consider EGD with propofol as next step if CTs are unremarkable.  Encouraged her to take 2 mg of Imodium every morning and a second dose in the afternoon if needed.  Add back Creon as solid foods are incorporated.  Continue to monitor weight 3 times a week and call for any worsening weight loss.  Zofran was denied by insurance.  The patient was provided with a GoodRx card with the monthly cost of $18.64.  Pharmacy informed the patient that there is an interaction between Zofran and Plaquenil.  Recommended hold off if she can manage, consider scopolamine if necessary.  EGD was completed 04/28/2020 which found normal esophagus, normal stomach, duodenal erosions status post gastric and duodenal biopsy.  Surgical otology found the biopsies to be antral and oxyntic mucosa with slight chronic inflammation but negative for H. pylori.  Duodenal biopsy with benign duodenal mucosa.  CT of the chest, abdomen, pelvis completed 05/11/2020.  Findings included long segment colonic thickening likely diverticular disease with possible low-level inflammation but no surrounding stranding and correlation with recent colonoscopy may be helpful.  Small stable juxtapleural nodule in the right upper chest that is benign based on 1 year stability, moderately large fat-containing left inguinal hernia, aortic atherosclerosis, emphysema and aortic valvular calcification.  There is a question of any need for repeat colonoscopy, the last one was completed in 2020.  Today she states she is doing okay overall. Still with diarrhea that she describes as chronic over the past   4 years, initially started when she had C. Diff; states she's had it ever since. Cannot discern any recent worsening to trigger her to seek care again. Stools are about 4-5 times in the morning and then will take Imodium and have no further stools. Weight is down another 8 lbs since last visit 2 months ago. Has to take Imodium to be able to go out. States  taste/smell has come back a little bit. Is eating a little bit recently. Typical diet includes cereal and banana or gravy. Later in the day peanut butter occasionally. Will have tossed salads as well. Not able to eat much meat. Does eat spaghetti at times. Tried Ensure but she got sick (though it was out of date). No more N/V. Denies abdominal pain. Still with occasional/rare GERD, diet dependent, on Protonix (admits large glass of OJ every morning). Denies hematochezia, melena, fever, chills. Denies URI or flu-like symptoms. Denies loss of sense of taste or smell. The patient has received COVID-19 vaccination(s). However was having a fever and was told they couldn't give her the second dose. Now is scared to get the second dose. Denies chest pain, dyspnea, dizziness, lightheadedness, syncope, near syncope. Denies any other upper or lower GI symptoms.  Past Medical History:  Diagnosis Date  . COPD (chronic obstructive pulmonary disease) (HCC)   . COVID-19 06/2019  . Depression   . Diabetes (HCC)   . Hyperlipidemia   . Hypothyroidism   . Neuropathy   . Rheumatoid arthritis (HCC)     Past Surgical History:  Procedure Laterality Date  . BIOPSY  09/16/2019   Procedure: BIOPSY;  Surgeon: Rourk, Robert M, MD;  Location: [facility];  Service: Endoscopy;;  . BIOPSY  04/28/2020   Procedure: BIOPSY;  Surgeon: Rourk, Robert M, MD;  Location: [facility];  Service: Endoscopy;;  . CATARACT EXTRACTION Bilateral   . COLONOSCOPY  09/2010   Dr. Patel: mild diverticulsis in sigmoid colon.   . COLONOSCOPY WITH PROPOFOL N/A 09/16/2019   Rourk: Diverticulosis, random colon biopsies negative for microscopic colitis.  . ESOPHAGOGASTRODUODENOSCOPY (EGD) WITH PROPOFOL N/A 04/28/2020   Procedure: ESOPHAGOGASTRODUODENOSCOPY (EGD) WITH PROPOFOL;  Surgeon: Rourk, Robert M, MD;  Location: [facility];  Service: Endoscopy;  Laterality: N/A;  2:15  . YAG LASER APPLICATION Left 07/27/2015   Procedure: YAG LASER  APPLICATION;  Surgeon: Carroll F Haines, MD;  Location: [facility];  Service: Ophthalmology;  Laterality: Left;    Current Outpatient Medications  Medication Sig Dispense Refill  . Cholecalciferol (VITAMIN D3) 125 MCG (5000 UT) TABS Take 1 tablet (5,000 Units total) by mouth daily. 30 tablet 3  . citalopram (CELEXA) 40 MG tablet Take 1 tablet (40 mg total) by mouth daily. 30 tablet 0  . dronabinol (MARINOL) 2.5 MG capsule Take 1 capsule (2.5 mg total) by mouth 2 (two) times daily before a meal. 60 capsule 0  . ferrous sulfate 325 (65 FE) MG tablet Take 325 mg by mouth daily with breakfast.    . Ferrous Sulfate Dried (FERROUS SULFATE IRON PO) Take by mouth daily.     . gabapentin (NEURONTIN) 300 MG capsule Take 1 capsule (300 mg total) by mouth 3 (three) times daily. 90 capsule 3  . hydroxychloroquine (PLAQUENIL) 200 MG tablet Take 200 mg by mouth 2 (two) times daily.     . leflunomide (ARAVA) 20 MG tablet Take 20 mg by mouth daily.    . levothyroxine (SYNTHROID) 100 MCG tablet Take 100 mcg by mouth every morning.    .   loperamide (IMODIUM A-D) 2 MG tablet Take 1 tablet (2 mg total) by mouth in the morning. Take second dose if needed 30 tablet 3  . ondansetron (ZOFRAN ODT) 4 MG disintegrating tablet Take 1 tablet (4 mg total) by mouth every 6 (six) hours. 120 tablet 1  . pantoprazole (PROTONIX) 40 MG tablet Take 1 tablet (40 mg total) by mouth daily before breakfast. 90 tablet 1  . pravastatin (PRAVACHOL) 40 MG tablet Take 1 tablet (40 mg total) by mouth at bedtime. 30 tablet 3  . traZODone (DESYREL) 100 MG tablet Take 1 tablet (100 mg total) by mouth at bedtime. 30 tablet 3   No current facility-administered medications for this visit.    Allergies as of 06/02/2020  . (No Known Allergies)    Family History  Problem Relation Age of Onset  . Stroke Mother   . Heart disease Mother   . Diabetes Mother   . Breast cancer Mother   . Other Father        stomach taken out for some reason  .  Migraines Daughter   . Colon cancer Neg Hx     Social History   Socioeconomic History  . Marital status: Widowed    Spouse name: Not on file  . Number of children: 1  . Years of education: Not on file  . Highest education level: Not on file  Occupational History  . Occupation: DISABLED  Tobacco Use  . Smoking status: Former Smoker    Packs/day: 1.00    Years: 15.00    Pack years: 15.00    Types: Cigarettes    Quit date: 09/11/2017    Years since quitting: 2.7  . Smokeless tobacco: Former User  Vaping Use  . Vaping Use: Never used  Substance and Sexual Activity  . Alcohol use: Never  . Drug use: Never  . Sexual activity: Not Currently  Other Topics Concern  . Not on file  Social History Narrative  . Not on file   Social Determinants of Health   Financial Resource Strain:   . Difficulty of Paying Living Expenses: Not on file  Food Insecurity:   . Worried About Running Out of Food in the Last Year: Not on file  . Ran Out of Food in the Last Year: Not on file  Transportation Needs:   . Lack of Transportation (Medical): Not on file  . Lack of Transportation (Non-Medical): Not on file  Physical Activity:   . Days of Exercise per Week: Not on file  . Minutes of Exercise per Session: Not on file  Stress:   . Feeling of Stress : Not on file  Social Connections:   . Frequency of Communication with Friends and Family: Not on file  . Frequency of Social Gatherings with Friends and Family: Not on file  . Attends Religious Services: Not on file  . Active Member of Clubs or Organizations: Not on file  . Attends Club or Organization Meetings: Not on file  . Marital Status: Not on file    Subjective: Review of Systems  Constitutional: Positive for weight loss. Negative for chills, fever and malaise/fatigue.  HENT: Negative for congestion and sore throat.   Respiratory: Negative for cough and shortness of breath.   Cardiovascular: Negative for chest pain and palpitations.   Gastrointestinal: Positive for diarrhea. Negative for abdominal pain, blood in stool, melena, nausea and vomiting.  Musculoskeletal: Negative for joint pain and myalgias.  Skin: Negative for rash.  Neurological: Negative for dizziness   and weakness.  Endo/Heme/Allergies: Does not bruise/bleed easily.  Psychiatric/Behavioral: Negative for depression. The patient is not nervous/anxious.   All other systems reviewed and are negative.    Objective: BP 118/79   Pulse 83   Temp 97.6 F (36.4 C)   Ht 5' 1" (1.549 m)   Wt 99 lb 12.8 oz (45.3 kg)   BMI 18.86 kg/m  Physical Exam Vitals and nursing note reviewed.  Constitutional:      General: She is not in acute distress.    Appearance: Normal appearance. She is well-developed and underweight. She is not ill-appearing, toxic-appearing or diaphoretic.  HENT:     Head: Normocephalic and atraumatic.     Nose: No congestion or rhinorrhea.  Eyes:     General: No scleral icterus. Cardiovascular:     Rate and Rhythm: Normal rate and regular rhythm.     Heart sounds: Normal heart sounds.  Pulmonary:     Effort: Pulmonary effort is normal. No respiratory distress.     Breath sounds: Examination of the right-lower field reveals wheezing. Examination of the left-lower field reveals wheezing. Wheezing present.     Comments: Mild end-expiratory wheezes noted consistent with COPD history. Abdominal:     General: Bowel sounds are normal.     Palpations: Abdomen is soft. There is no hepatomegaly, splenomegaly or mass.     Tenderness: There is no abdominal tenderness. There is no guarding or rebound.     Hernia: No hernia is present.  Skin:    General: Skin is warm and dry.     Coloration: Skin is not jaundiced.     Findings: No rash.  Neurological:     General: No focal deficit present.     Mental Status: She is alert and oriented to person, place, and time.  Psychiatric:        Attention and Perception: Attention normal.        Mood and  Affect: Mood normal.        Speech: Speech normal.        Behavior: Behavior normal.        Thought Content: Thought content normal.        Cognition and Memory: Cognition and memory normal.      Assessment:  Very pleasant 64-year-old female with history of chronic diarrhea as per HPI.  More concerning is that she has had progressive weight loss over the past year and is now only 99.8 pounds (previously 135 pounds last year).  As per HPI and previous office visit in July notes frequent loose stools that are yellow, seem oily.  Was confused by instructions to restart Creon and is not currently on it despite taking in solid foods.  She has been started on Marinol to help with her appetite.  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  Had a CT of the abdomen, pelvis, chest which only found colon wall thickening could be due to diverticulosis, but no definitive diagnosis.  She did have a colonoscopy a year ago but this is just for surveillance for colon polyps.  She tried taking Ensure, but the one she had was out of date and it made her sick.    Diarrhea with weight loss: Cannot eliminate possibility of recurrent colon infection.  Also possible exocrine pancreatic insufficiency given her description of her stools.  Cannot rule out more insidious pathology with her mildly abnormal CT, although her recent colonoscopy is reassuring.  At this point I will check fecal   elastase, stool studies.  I will have her restart her Creon when she has completed her stool studies and have sent a new prescription.  We will plan for colonoscopy to reevaluate.  Recommend Ensure supplementation.  I will have her follow-up in 4 weeks with the nurse for a weight check and in 2 months in our office for office visit.  Proceed with colonoscopy on propofol/MAC by Dr. Rourk in near future: the risks, benefits, and alternatives have been discussed with the patient in detail. The patient states understanding and  desires to proceed.  The patient is currently on trazodone, Neurontin, Marinol, Celexa. The patient is not on any other anticoagulants, anxiolytics, chronic pain medications, antidepressants, antidiabetics, or iron supplements.  We will plan for procedure on propofol/MAC to promote adequate sedation as was done for her last colonoscopy  ASA III   Plan: 1. Pancreatic fecal elastase and stool studies ordered 2. After completing stool studies, restart Creon with weight-based dosing (48,000 units per meal, 24,000 units per snack initially) 3. Ensure or other protein drinks supplementation aim for at least 2 a day 4. Colonoscopy to further evaluate for abnormal findings on CT as well as chronic diarrhea now with significant and progressive weight loss 5. Nurse visit in 4 weeks for weight check 6. Follow-up in 2 months for office visit   Thank you for allowing us to participate in the care of Saraiyah A Morning  Daelin Haste, DNP, AGNP-C Adult & Gerontological Nurse Practitioner [facility]   06/02/2020 11:05 AM   Disclaimer: This note was dictated with voice recognition software. Similar sounding words can inadvertently be transcribed and may not be corrected upon review.  

## 2020-06-04 ENCOUNTER — Other Ambulatory Visit: Payer: Self-pay

## 2020-06-04 ENCOUNTER — Other Ambulatory Visit (HOSPITAL_COMMUNITY)
Admission: RE | Admit: 2020-06-04 | Discharge: 2020-06-04 | Disposition: A | Discharge status: Home / Self Care | Payer: 59 | Source: Ambulatory Visit | Attending: Internal Medicine | Admitting: Internal Medicine

## 2020-06-04 ENCOUNTER — Encounter (HOSPITAL_COMMUNITY)
Admission: RE | Admit: 2020-06-04 | Discharge: 2020-06-04 | Disposition: A | Discharge status: Home / Self Care | Payer: 59 | Source: Ambulatory Visit | Attending: Internal Medicine | Admitting: Internal Medicine

## 2020-06-04 DIAGNOSIS — Z20822 Contact with and (suspected) exposure to covid-19: Secondary | ICD-10-CM | POA: Insufficient documentation

## 2020-06-04 DIAGNOSIS — Z01818 Encounter for other preprocedural examination: Secondary | ICD-10-CM | POA: Diagnosis present

## 2020-06-04 LAB — CBC WITH DIFFERENTIAL/PLATELET
Abs Immature Granulocytes: 0.01 10*3/uL (ref 0.00–0.07)
Basophils Absolute: 0 10*3/uL (ref 0.0–0.1)
Basophils Relative: 0 %
Eosinophils Absolute: 0.3 10*3/uL (ref 0.0–0.5)
Eosinophils Relative: 5 %
HCT: 29.5 % — ABNORMAL LOW (ref 36.0–46.0)
Hemoglobin: 8.6 g/dL — ABNORMAL LOW (ref 12.0–15.0)
Immature Granulocytes: 0 %
Lymphocytes Relative: 28 %
Lymphs Abs: 1.3 10*3/uL (ref 0.7–4.0)
MCH: 27.1 pg (ref 26.0–34.0)
MCHC: 29.2 g/dL — ABNORMAL LOW (ref 30.0–36.0)
MCV: 93.1 fL (ref 80.0–100.0)
Monocytes Absolute: 0.5 10*3/uL (ref 0.1–1.0)
Monocytes Relative: 11 %
Neutro Abs: 2.6 10*3/uL (ref 1.7–7.7)
Neutrophils Relative %: 56 %
Platelets: 356 10*3/uL (ref 150–400)
RBC: 3.17 MIL/uL — ABNORMAL LOW (ref 3.87–5.11)
RDW: 14.8 % (ref 11.5–15.5)
WBC: 4.7 10*3/uL (ref 4.0–10.5)
nRBC: 0 % (ref 0.0–0.2)

## 2020-06-04 LAB — COMPREHENSIVE METABOLIC PANEL
ALT: 10 U/L (ref 0–44)
AST: 21 U/L (ref 15–41)
Albumin: 2.2 g/dL — ABNORMAL LOW (ref 3.5–5.0)
Alkaline Phosphatase: 134 U/L — ABNORMAL HIGH (ref 38–126)
Anion gap: 11 (ref 5–15)
BUN: 6 mg/dL — ABNORMAL LOW (ref 8–23)
CO2: 25 mmol/L (ref 22–32)
Calcium: 8 mg/dL — ABNORMAL LOW (ref 8.9–10.3)
Chloride: 103 mmol/L (ref 98–111)
Creatinine, Ser: 0.67 mg/dL (ref 0.44–1.00)
GFR calc Af Amer: >60 mL/min (ref >60)
GFR calc non Af Amer: >60 mL/min (ref >60)
Glucose, Bld: 175 mg/dL — ABNORMAL HIGH (ref 70–99)
Potassium: 3.8 mmol/L (ref 3.5–5.1)
Sodium: 139 mmol/L (ref 135–145)
Total Bilirubin: 0.4 mg/dL (ref 0.3–1.2)
Total Protein: 5.4 g/dL — ABNORMAL LOW (ref 6.5–8.1)

## 2020-06-04 LAB — SARS CORONAVIRUS 2 (TAT 6-24 HRS): SARS Coronavirus 2: NEGATIVE

## 2020-06-05 NOTE — OR Nursing (Signed)
Hgb. 8.6 reported to Dr. Briant Cedar,  No further orders and ok to proceed

## 2020-06-08 ENCOUNTER — Ambulatory Visit (HOSPITAL_COMMUNITY): Payer: 59 | Admitting: Anesthesiology

## 2020-06-08 ENCOUNTER — Encounter (HOSPITAL_COMMUNITY)
Admission: RE | Disposition: A | Discharge status: Home / Self Care | Payer: Self-pay | Source: Home / Self Care | Attending: Internal Medicine

## 2020-06-08 ENCOUNTER — Encounter (HOSPITAL_COMMUNITY): Payer: Self-pay | Admitting: Internal Medicine

## 2020-06-08 ENCOUNTER — Ambulatory Visit (HOSPITAL_COMMUNITY)
Admission: RE | Admit: 2020-06-08 | Discharge: 2020-06-08 | Disposition: A | Discharge status: Home / Self Care | Payer: 59 | Attending: Internal Medicine | Admitting: Internal Medicine

## 2020-06-08 DIAGNOSIS — Z8616 Personal history of COVID-19: Secondary | ICD-10-CM | POA: Insufficient documentation

## 2020-06-08 DIAGNOSIS — Z8719 Personal history of other diseases of the digestive system: Secondary | ICD-10-CM | POA: Insufficient documentation

## 2020-06-08 DIAGNOSIS — F329 Major depressive disorder, single episode, unspecified: Secondary | ICD-10-CM | POA: Insufficient documentation

## 2020-06-08 DIAGNOSIS — D649 Anemia, unspecified: Secondary | ICD-10-CM | POA: Diagnosis not present

## 2020-06-08 DIAGNOSIS — M069 Rheumatoid arthritis, unspecified: Secondary | ICD-10-CM | POA: Insufficient documentation

## 2020-06-08 DIAGNOSIS — J449 Chronic obstructive pulmonary disease, unspecified: Secondary | ICD-10-CM | POA: Diagnosis not present

## 2020-06-08 DIAGNOSIS — K8681 Exocrine pancreatic insufficiency: Secondary | ICD-10-CM | POA: Diagnosis not present

## 2020-06-08 DIAGNOSIS — K5730 Diverticulosis of large intestine without perforation or abscess without bleeding: Secondary | ICD-10-CM | POA: Insufficient documentation

## 2020-06-08 DIAGNOSIS — K219 Gastro-esophageal reflux disease without esophagitis: Secondary | ICD-10-CM | POA: Insufficient documentation

## 2020-06-08 DIAGNOSIS — R197 Diarrhea, unspecified: Secondary | ICD-10-CM | POA: Insufficient documentation

## 2020-06-08 DIAGNOSIS — E1140 Type 2 diabetes mellitus with diabetic neuropathy, unspecified: Secondary | ICD-10-CM | POA: Diagnosis not present

## 2020-06-08 DIAGNOSIS — Z87891 Personal history of nicotine dependence: Secondary | ICD-10-CM | POA: Diagnosis not present

## 2020-06-08 DIAGNOSIS — E039 Hypothyroidism, unspecified: Secondary | ICD-10-CM | POA: Insufficient documentation

## 2020-06-08 DIAGNOSIS — R634 Abnormal weight loss: Secondary | ICD-10-CM | POA: Insufficient documentation

## 2020-06-08 DIAGNOSIS — E785 Hyperlipidemia, unspecified: Secondary | ICD-10-CM | POA: Diagnosis not present

## 2020-06-08 DIAGNOSIS — Z79899 Other long term (current) drug therapy: Secondary | ICD-10-CM | POA: Diagnosis not present

## 2020-06-08 DIAGNOSIS — R933 Abnormal findings on diagnostic imaging of other parts of digestive tract: Secondary | ICD-10-CM | POA: Diagnosis present

## 2020-06-08 HISTORY — PX: BIOPSY: SHX5522

## 2020-06-08 HISTORY — PX: COLONOSCOPY WITH PROPOFOL: SHX5780

## 2020-06-08 LAB — GASTROINTESTINAL PATHOGEN PANEL PCR
C. difficile Tox A/B, PCR: NOT DETECTED
Campylobacter, PCR: NOT DETECTED
Cryptosporidium, PCR: NOT DETECTED
E coli (ETEC) LT/ST PCR: NOT DETECTED
E coli (STEC) stx1/stx2, PCR: NOT DETECTED
E coli 0157, PCR: NOT DETECTED
Giardia lamblia, PCR: NOT DETECTED
Norovirus, PCR: NOT DETECTED
Rotavirus A, PCR: NOT DETECTED
Salmonella, PCR: NOT DETECTED
Shigella, PCR: NOT DETECTED

## 2020-06-08 LAB — PANCREATIC ELASTASE, FECAL: Pancreatic Elastase-1, Stool: 451 mcg/g

## 2020-06-08 LAB — GLUCOSE, CAPILLARY: Glucose-Capillary: 180 mg/dL — ABNORMAL HIGH (ref 70–99)

## 2020-06-08 LAB — C. DIFFICILE GDH AND TOXIN A/B
GDH ANTIGEN: NOT DETECTED
MICRO NUMBER:: 10925398
SPECIMEN QUALITY:: ADEQUATE
TOXIN A AND B: NOT DETECTED

## 2020-06-08 SURGERY — COLONOSCOPY WITH PROPOFOL
Anesthesia: General

## 2020-06-08 MED ORDER — STERILE WATER FOR IRRIGATION IR SOLN
Status: DC | PRN
  Administered 2020-06-08: 1.5 mL

## 2020-06-08 MED ORDER — PROPOFOL 500 MG/50ML IV EMUL
INTRAVENOUS | Status: DC | PRN
  Administered 2020-06-08: 100 ug/kg/min via INTRAVENOUS

## 2020-06-08 MED ORDER — PROPOFOL 10 MG/ML IV BOLUS
INTRAVENOUS | Status: DC | PRN
  Administered 2020-06-08: 20 mg via INTRAVENOUS
  Administered 2020-06-08: 30 mg via INTRAVENOUS
  Administered 2020-06-08: 20 mg via INTRAVENOUS

## 2020-06-08 MED ORDER — LACTATED RINGERS IV SOLN: Freq: Once | INTRAVENOUS | Status: AC

## 2020-06-08 NOTE — Interval H&P Note (Signed)
History and Physical Interval Note:  06/08/2020 9:40 AM  Erica Beasley  has presented today for surgery, with the diagnosis of abn CT, weight loss, diarrhea.  The various methods of treatment have been discussed with the patient and family. After consideration of risks, benefits and other options for treatment, the patient has consented to  Procedure(s) with comments: COLONOSCOPY WITH PROPOFOL (N/A) - 9:15am as a surgical intervention.  The patient's history has been reviewed, patient examined, no change in status, stable for surgery.  I have reviewed the patient's chart and labs.  Questions were answered to the patient's satisfaction.     Erica Beasley  No change.  Abnormal left colon on CT.  Given complexity of the case and new CT findings, colonoscopy now being done.  Plan on obtaining a stool sample.  The risks, benefits, limitations, alternatives and imponderables have been reviewed with the patient. Questions have been answered. All parties are agreeable.

## 2020-06-08 NOTE — Discharge Instructions (Signed)
Colonoscopy Discharge Instructions  Read the instructions outlined below and refer to this sheet in the next few weeks. These discharge instructions provide you with general information on caring for yourself after you leave the hospital. Your doctor may also give you specific instructions. While your treatment has been planned according to the most current medical practices available, unavoidable complications occasionally occur. If you have any problems or questions after discharge, call Dr. Gala Romney at [phone]. ACTIVITY  You may resume your regular activity, but move at a slower pace for the next 24 hours.   Take frequent rest periods for the next 24 hours.   Walking will help get rid of the air and reduce the bloated feeling in your belly (abdomen).   No driving for 24 hours (because of the medicine (anesthesia) used during the test).    Do not sign any important legal documents or operate any machinery for 24 hours (because of the anesthesia used during the test).  NUTRITION  Drink plenty of fluids.   You may resume your normal diet as instructed by your doctor.   Begin with a light meal and progress to your normal diet. Heavy or fried foods are harder to digest and may make you feel sick to your stomach (nauseated).   Avoid alcoholic beverages for 24 hours or as instructed.  MEDICATIONS  You may resume your normal medications unless your doctor tells you otherwise.  WHAT YOU CAN EXPECT TODAY  Some feelings of bloating in the abdomen.   Passage of more gas than usual.   Spotting of blood in your stool or on the toilet paper.  IF YOU HAD POLYPS REMOVED DURING THE COLONOSCOPY:  No aspirin products for 7 days or as instructed.   No alcohol for 7 days or as instructed.   Eat a soft diet for the next 24 hours.  FINDING OUT THE RESULTS OF YOUR TEST Not all test results are available during your visit. If your test results are not back during the visit, make an appointment  with your caregiver to find out the results. Do not assume everything is normal if you have not heard from your caregiver or the medical facility. It is important for you to follow up on all of your test results.  SEEK IMMEDIATE MEDICAL ATTENTION IF:  You have more than a spotting of blood in your stool.   Your belly is swollen (abdominal distention).   You are nauseated or vomiting.   You have a temperature over 101.   You have abdominal pain or discomfort that is severe or gets worse throughout the day.   Diverticulosis and internal hemorrhoids found.  Colon looked about the same as it did last year.  No new abnormality seen in your colon as suggested by the CT scan which is good news  Biopsies stool sample taken.  Further recommendations to follow pending review of pathology report  Office visit in 4 weeks with [facility]  At patient request, I called Royston Sinner [phone] voicemail left a message   Diverticulosis  Diverticulosis is a condition that develops when small pouches (diverticula) form in the wall of the large intestine (colon). The colon is where water is absorbed and stool (feces) is formed. The pouches form when the inside layer of the colon pushes through weak spots in the outer layers of the colon. You may have a few pouches or many of them. The pouches usually do not cause problems unless they become inflamed or infected. When  this happens, the condition is called diverticulitis. What are the causes? The cause of this condition is not known. What increases the risk? The following factors may make you more likely to develop this condition:  Being older than age 40. Your risk for this condition increases with age. Diverticulosis is rare among people younger than age 72. By age 30, many people have it.  Eating a low-fiber diet.  Having frequent constipation.  Being overweight.  Not getting enough exercise.  Smoking.  Taking over-the-counter pain  medicines, like aspirin and ibuprofen.  Having a family history of diverticulosis. What are the signs or symptoms? In most people, there are no symptoms of this condition. If you do have symptoms, they may include:  Bloating.  Cramps in the abdomen.  Constipation or diarrhea.  Pain in the lower left side of the abdomen. How is this diagnosed? Because diverticulosis usually has no symptoms, it is most often diagnosed during an exam for other colon problems. The condition may be diagnosed by:  Using a flexible scope to examine the colon (colonoscopy).  Taking an X-ray of the colon after dye has been put into the colon (barium enema).  Having a CT scan. How is this treated? You may not need treatment for this condition. Your health care provider may recommend treatment to prevent problems. You may need treatment if you have symptoms or if you previously had diverticulitis. Treatment may include:  Eating a high-fiber diet.  Taking a fiber supplement.  Taking a live bacteria supplement (probiotic).  Taking medicine to relax your colon. Follow these instructions at home: Medicines  Take over-the-counter and prescription medicines only as told by your health care provider.  If told by your health care provider, take a fiber supplement or probiotic. Constipation prevention Your condition may cause constipation. To prevent or treat constipation, you may need to:  Drink enough fluid to keep your urine pale yellow.  Take over-the-counter or prescription medicines.  Eat foods that are high in fiber, such as beans, whole grains, and fresh fruits and vegetables.  Limit foods that are high in fat and processed sugars, such as fried or sweet foods.  General instructions  Try not to strain when you have a bowel movement.  Keep all follow-up visits as told by your health care provider. This is important. Contact a health care provider if you:  Have pain in your abdomen.  Have  bloating.  Have cramps.  Have not had a bowel movement in 3 days. Get help right away if:  Your pain gets worse.  Your bloating becomes very bad.  You have a fever or chills, and your symptoms suddenly get worse.  You vomit.  You have bowel movements that are bloody or black.  You have bleeding from your rectum. Summary  Diverticulosis is a condition that develops when small pouches (diverticula) form in the wall of the large intestine (colon).  You may have a few pouches or many of them.  This condition is most often diagnosed during an exam for other colon problems.  Treatment may include increasing the fiber in your diet, taking supplements, or taking medicines. This information is not intended to replace advice given to you by your health care provider. Make sure you discuss any questions you have with your health care provider. Document Revised: 04/11/2019 Document Reviewed: 04/11/2019 Elsevier Patient Education  [place].   Hemorrhoids Hemorrhoids are swollen veins that may develop:  In the butt (rectum). These are called internal  hemorrhoids.  Around the opening of the butt (anus). These are called external hemorrhoids. Hemorrhoids can cause pain, itching, or bleeding. Most of the time, they do not cause serious problems. They usually get better with diet changes, lifestyle changes, and other home treatments. What are the causes? This condition may be caused by:  Having trouble pooping (constipation).  Pushing hard (straining) to poop.  Watery poop (diarrhea).  Pregnancy.  Being very overweight (obese).  Sitting for long periods of time.  Heavy lifting or other activity that causes you to strain.  Anal sex.  Riding a bike for a long period of time. What are the signs or symptoms? Symptoms of this condition include:  Pain.  Itching or soreness in the butt.  Bleeding from the butt.  Leaking poop.  Swelling in the area.  One or  more lumps around the opening of your butt. How is this diagnosed? A doctor can often diagnose this condition by looking at the affected area. The doctor may also:  Do an exam that involves feeling the area with a gloved hand (digital rectal exam).  Examine the area inside your butt using a small tube (anoscope).  Order blood tests. This may be done if you have lost a lot of blood.  Have you get a test that involves looking inside the colon using a flexible tube with a camera on the end (sigmoidoscopy or colonoscopy). How is this treated? This condition can usually be treated at home. Your doctor may tell you to change what you eat, make lifestyle changes, or try home treatments. If these do not help, procedures can be done to remove the hemorrhoids or make them smaller. These may involve:  Placing rubber bands at the base of the hemorrhoids to cut off their blood supply.  Injecting medicine into the hemorrhoids to shrink them.  Shining a type of light energy onto the hemorrhoids to cause them to fall off.  Doing surgery to remove the hemorrhoids or cut off their blood supply. Follow these instructions at home: Eating and drinking   Eat foods that have a lot of fiber in them. These include whole grains, beans, nuts, fruits, and vegetables.  Ask your doctor about taking products that have added fiber (fibersupplements).  Reduce the amount of fat in your diet. You can do this by: ? Eating low-fat dairy products. ? Eating less red meat. ? Avoiding processed foods.  Drink enough fluid to keep your pee (urine) pale yellow. Managing pain and swelling   Take a warm-water bath (sitz bath) for 20 minutes to ease pain. Do this 3-4 times a day. You may do this in a bathtub or using a portable sitz bath that fits over the toilet.  If told, put ice on the painful area. It may be helpful to use ice between your warm baths. ? Put ice in a plastic bag. ? Place a towel between your skin and  the bag. ? Leave the ice on for 20 minutes, 2-3 times a day. General instructions  Take over-the-counter and prescription medicines only as told by your doctor. ? Medicated creams and medicines may be used as told.  Exercise often. Ask your doctor how much and what kind of exercise is best for you.  Go to the bathroom when you have the urge to poop. Do not wait.  Avoid pushing too hard when you poop.  Keep your butt dry and clean. Use wet toilet paper or moist towelettes after pooping.  Do not  sit on the toilet for a long time.  Keep all follow-up visits as told by your doctor. This is important. Contact a doctor if you:  Have pain and swelling that do not get better with treatment or medicine.  Have trouble pooping.  Cannot poop.  Have pain or swelling outside the area of the hemorrhoids. Get help right away if you have:  Bleeding that will not stop. Summary  Hemorrhoids are swollen veins in the butt or around the opening of the butt.  They can cause pain, itching, or bleeding.  Eat foods that have a lot of fiber in them. These include whole grains, beans, nuts, fruits, and vegetables.  Take a warm-water bath (sitz bath) for 20 minutes to ease pain. Do this 3-4 times a day. This information is not intended to replace advice given to you by your health care provider. Make sure you discuss any questions you have with your health care provider. Document Revised: 09/20/2018 Document Reviewed: 02/01/2018 Elsevier Patient Education  [place].

## 2020-06-08 NOTE — Op Note (Signed)
[facility] Patient Name: Erica Beasley Procedure Date: 06/08/2020 9:28 AM MRN: [id number] Date of Birth: 07-10-1956 Attending MD: Norvel Richards , MD CSN: [id number] Age: 64 Admit Type: Outpatient Procedure:                Colonoscopy Indications:              Abnormal CT of the GI tract Providers:                Norvel Richards, MD, Charlsie Quest. Theda Sers RN, RN,                            Lurline Del, RN Referring MD:              Medicines:                Propofol per Anesthesia Complications:            No immediate complications. Estimated blood loss:                            None. Estimated Blood Loss:     Estimated blood loss was minimal. Procedure:                Pre-Anesthesia Assessment:                           - Prior to the procedure, a History and Physical                            was performed, and patient medications and                            allergies were reviewed. The patient's tolerance of                            previous anesthesia was also reviewed. The risks                            and benefits of the procedure and the sedation                            options and risks were discussed with the patient.                            All questions were answered, and informed consent                            was obtained. Prior Anticoagulants: The patient has                            taken no previous anticoagulant or antiplatelet                            agents. ASA Grade Assessment: II - A patient with  mild systemic disease. After reviewing the risks                            and benefits, the patient was deemed in                            satisfactory condition to undergo the procedure.                           After obtaining informed consent, the colonoscope                            was passed under direct vision. Throughout the                            procedure, the patient's blood pressure,  pulse, and                            oxygen saturations were monitored continuously. The                            CF-[id number] ([id number]) scope was introduced through                            the anus and advanced to the the cecum, identified                            by appendiceal orifice and ileocecal valve. The                            colonoscopy was performed without difficulty. The                            patient tolerated the procedure well. The quality                            of the bowel preparation was adequate. Scope In: 9:52:41 AM Scope Out: 10:04:42 AM Scope Withdrawal Time: 0 hours 6 minutes 22 seconds  Total Procedure Duration: 0 hours 12 minutes 1 second  Findings:      The perianal and digital rectal examinations were normal. Scattered       pancolonic diverticula. Slight friability of the cecum. Internal       hemorrhoids. Normal distal 10 cm terminal ileum. The remainder the       colonic mucosa appeared entirely normal. Segmental biopsies taken. Stool       sample taken for GIP and elastase Impression:               -Pancolonic diverticulosis?"mild. Normal-appearing                            colonic mucosa otherwise. Status post segmental                            biopsy and stool sampling terminal hemorrhoids. Moderate Sedation:  Moderate (conscious) sedation was personally administered by an       anesthesia professional. The following parameters were monitored: oxygen       saturation, heart rate, blood pressure, respiratory rate, EKG, adequacy       of pulmonary ventilation, and response to care. Recommendation:           - Patient has a contact number available for                            emergencies. The signs and symptoms of potential                            delayed complications were discussed with the                            patient. Return to normal activities tomorrow.                            Written discharge instructions  were provided to the                            patient.                           - Advance diet as tolerated. Follow-up on                            pathology. Repeat colonoscopy in 5 years for                            surveillance. Office visit with [place] in 4 weeks.                            Endoscopically, no inflammation seen in this                            patient's left colon. Procedure Code(s):        --- Professional ---                           [phone], Colonoscopy, flexible; diagnostic, including                            collection of specimen(s) by brushing or washing,                            when performed (separate procedure) Diagnosis Code(s):        --- Professional ---                           R93.3, Abnormal findings on diagnostic imaging of                            other parts of digestive tract CPT copyright 2019 American Medical Association. All rights reserved. The codes documented in this report are preliminary and  upon coder review may  be revised to meet current compliance requirements. Cristopher Estimable. Anias Bartol, MD Norvel Richards, MD 06/08/2020 10:16:52 AM This report has been signed electronically. Number of Addenda: 0

## 2020-06-08 NOTE — Anesthesia Preprocedure Evaluation (Signed)
Anesthesia Evaluation  Patient identified by MRN, date of birth, ID band Patient awake    Reviewed: Allergy & Precautions, NPO status , Patient's Chart, lab work & pertinent test results  History of Anesthesia Complications Negative for: history of anesthetic complications  Airway Mallampati: II  TM Distance: >3 FB Neck ROM: Full    Dental  (+) Edentulous Upper, Missing, Dental Advisory Given   Pulmonary shortness of breath and with exertion, pneumonia (covid), resolved, COPD, former smoker,    Pulmonary exam normal breath sounds clear to auscultation       Cardiovascular Exercise Tolerance: Good Normal cardiovascular exam Rhythm:Regular Rate:Normal     Neuro/Psych PSYCHIATRIC DISORDERS Anxiety Depression    GI/Hepatic Neg liver ROS, GERD  Medicated and Controlled,  Endo/Other  diabetes, Well Controlled, Type 2Hypothyroidism   Renal/GU negative Renal ROS     Musculoskeletal  (+) Arthritis , Rheumatoid disorders,    Abdominal   Peds  Hematology  (+) anemia ,   Anesthesia Other Findings   Reproductive/Obstetrics                             Anesthesia Physical  Anesthesia Plan  ASA: III  Anesthesia Plan: General   Post-op Pain Management:    Induction: Intravenous  PONV Risk Score and Plan: TIVA  Airway Management Planned: Natural Airway, Nasal Cannula and Simple Face Mask  Additional Equipment:   Intra-op Plan:   Post-operative Plan:   Informed Consent: I have reviewed the patients History and Physical, chart, labs and discussed the procedure including the risks, benefits and alternatives for the proposed anesthesia with the patient or authorized representative who has indicated his/her understanding and acceptance.     Dental advisory given  Plan Discussed with: CRNA and Surgeon  Anesthesia Plan Comments:         Anesthesia Quick Evaluation

## 2020-06-08 NOTE — Progress Notes (Signed)
Called Dr.Rourks office for 4 week follow up with Erica Beasley but patient currently has appointment on November 10th, that is offices first available. Receptionist placed on cancellation list for sooner appointment. I made patient aware.

## 2020-06-08 NOTE — Anesthesia Postprocedure Evaluation (Signed)
Anesthesia Post Note  Patient: Erica Beasley  Procedure(s) Performed: COLONOSCOPY WITH PROPOFOL (N/A ) BIOPSY  Patient location during evaluation: PACU Anesthesia Type: General Level of consciousness: awake and alert and oriented Pain management: pain level controlled Vital Signs Assessment: post-procedure vital signs reviewed and stable Respiratory status: spontaneous breathing and respiratory function stable Cardiovascular status: blood pressure returned to baseline Postop Assessment: no apparent nausea or vomiting Anesthetic complications: no   No complications documented.   Last Vitals:  Vitals:   06/08/20 1015 06/08/20 1032  BP: 106/69 122/70  Pulse: (!) 55 (!) 55  Resp: 13 14  Temp:  36.8 C  SpO2: 100% 100%    Last Pain:  Vitals:   06/08/20 1032  TempSrc: Oral  PainSc: 0-No pain                 Erica Beasley

## 2020-06-08 NOTE — Transfer of Care (Signed)
Immediate Anesthesia Transfer of Care Note  Patient: Erica Beasley  Procedure(s) Performed: COLONOSCOPY WITH PROPOFOL (N/A ) BIOPSY  Patient Location: PACU  Anesthesia Type:MAC  Level of Consciousness: awake, alert  and oriented  Airway & Oxygen Therapy: Patient Spontanous Breathing and Patient connected to nasal cannula oxygen  Post-op Assessment: Report given to RN and Post -op Vital signs reviewed and stable  Post vital signs: Reviewed and stable  Last Vitals:  Vitals Value Taken Time  BP 106/56 06/08/20 1010  Temp    Pulse 58 06/08/20 1011  Resp 25 06/08/20 1012  SpO2 100 % 06/08/20 1011  Vitals shown include unvalidated device data.  Last Pain:  Vitals:   06/08/20 0950  PainSc: 0-No pain         Complications: No complications documented.

## 2020-06-09 ENCOUNTER — Encounter: Payer: Self-pay | Admitting: Internal Medicine

## 2020-06-09 ENCOUNTER — Telehealth: Payer: Self-pay

## 2020-06-09 LAB — GASTROINTESTINAL PANEL BY PCR, STOOL (REPLACES STOOL CULTURE)

## 2020-06-09 LAB — SURGICAL PATHOLOGY

## 2020-06-09 NOTE — Telephone Encounter (Signed)
Letter mailed to the pt. 

## 2020-06-09 NOTE — Telephone Encounter (Signed)
OV made in next available

## 2020-06-09 NOTE — Telephone Encounter (Signed)
Per Dr.Rourk- Send letter to patient.  Send copy of letter with path to referring provider and PCP.  Fecal elastase canceled at the time of colonoscopy because stool was liquid and formed.   Need a follow-up appointment with Erica Beasley in 4 to 6 weeks

## 2020-06-12 ENCOUNTER — Encounter (HOSPITAL_COMMUNITY): Payer: Self-pay | Admitting: Internal Medicine

## 2020-06-16 ENCOUNTER — Encounter: Payer: Self-pay | Admitting: Nurse Practitioner

## 2020-06-17 ENCOUNTER — Other Ambulatory Visit (HOSPITAL_COMMUNITY): Payer: Self-pay | Admitting: Hematology

## 2020-06-19 ENCOUNTER — Telehealth: Payer: Self-pay | Admitting: Nurse Practitioner

## 2020-06-19 DIAGNOSIS — R112 Nausea with vomiting, unspecified: Secondary | ICD-10-CM

## 2020-06-19 DIAGNOSIS — K529 Noninfective gastroenteritis and colitis, unspecified: Secondary | ICD-10-CM

## 2020-06-19 MED ORDER — DICYCLOMINE HCL 10 MG PO CAPS: 10.0000 mg | ORAL_CAPSULE | Freq: Two times a day (BID) | ORAL | 3 refills | Status: DC | PRN

## 2020-06-19 NOTE — Telephone Encounter (Signed)
Spoke with the patient's daughter. No longer taking Plaquanil. Still with diarrhea despite Imodium in the morning and (sometimes?) in the evening. Intermittently takes Protonix (daughter discusses some concerns with medication compliance). Discussed results including imaging, TCS, stool studies, labs, etc. At this point no identifiable cause. Doubt biliary issue due to lack of pain. More likely function bowel issue such as IBS.  Discussed using Zofran ODT q 6 hours prn Will add bentyl bid prn diarrhea Can continue Imodium per label instructions Call if worsening problems, otherwise will see at follow-up

## 2020-06-25 ENCOUNTER — Telehealth: Payer: Self-pay | Admitting: *Deleted

## 2020-06-25 ENCOUNTER — Inpatient Hospital Stay (HOSPITAL_COMMUNITY): Payer: 59 | Attending: Hematology | Admitting: Hematology

## 2020-06-25 ENCOUNTER — Inpatient Hospital Stay (HOSPITAL_COMMUNITY): Payer: 59

## 2020-06-25 ENCOUNTER — Other Ambulatory Visit: Payer: Self-pay

## 2020-06-25 VITALS — BP 157/80 | HR 77 | Temp 96.9°F | Resp 16 | Wt 97.0 lb

## 2020-06-25 DIAGNOSIS — F329 Major depressive disorder, single episode, unspecified: Secondary | ICD-10-CM | POA: Insufficient documentation

## 2020-06-25 DIAGNOSIS — R634 Abnormal weight loss: Secondary | ICD-10-CM | POA: Diagnosis not present

## 2020-06-25 DIAGNOSIS — M069 Rheumatoid arthritis, unspecified: Secondary | ICD-10-CM | POA: Diagnosis not present

## 2020-06-25 DIAGNOSIS — D649 Anemia, unspecified: Secondary | ICD-10-CM | POA: Diagnosis not present

## 2020-06-25 DIAGNOSIS — Z79899 Other long term (current) drug therapy: Secondary | ICD-10-CM | POA: Diagnosis not present

## 2020-06-25 DIAGNOSIS — D472 Monoclonal gammopathy: Secondary | ICD-10-CM | POA: Diagnosis present

## 2020-06-25 DIAGNOSIS — Z87891 Personal history of nicotine dependence: Secondary | ICD-10-CM | POA: Diagnosis not present

## 2020-06-25 DIAGNOSIS — Z803 Family history of malignant neoplasm of breast: Secondary | ICD-10-CM | POA: Diagnosis not present

## 2020-06-25 DIAGNOSIS — Z8616 Personal history of COVID-19: Secondary | ICD-10-CM | POA: Insufficient documentation

## 2020-06-25 DIAGNOSIS — R803 Bence Jones proteinuria: Secondary | ICD-10-CM | POA: Diagnosis not present

## 2020-06-25 LAB — COMPREHENSIVE METABOLIC PANEL
ALT: 10 U/L (ref 0–44)
AST: 21 U/L (ref 15–41)
Albumin: 2.3 g/dL — ABNORMAL LOW (ref 3.5–5.0)
Alkaline Phosphatase: 168 U/L — ABNORMAL HIGH (ref 38–126)
Anion gap: 10 (ref 5–15)
BUN: 5 mg/dL — ABNORMAL LOW (ref 8–23)
CO2: 26 mmol/L (ref 22–32)
Calcium: 7.5 mg/dL — ABNORMAL LOW (ref 8.9–10.3)
Chloride: 99 mmol/L (ref 98–111)
Creatinine, Ser: 0.7 mg/dL (ref 0.44–1.00)
GFR calc Af Amer: >60 mL/min (ref >60)
GFR calc non Af Amer: >60 mL/min (ref >60)
Glucose, Bld: 85 mg/dL (ref 70–99)
Potassium: 3 mmol/L — ABNORMAL LOW (ref 3.5–5.1)
Sodium: 135 mmol/L (ref 135–145)
Total Bilirubin: 0.3 mg/dL (ref 0.3–1.2)
Total Protein: 6 g/dL — ABNORMAL LOW (ref 6.5–8.1)

## 2020-06-25 LAB — CBC WITH DIFFERENTIAL/PLATELET
Abs Immature Granulocytes: 0.01 10*3/uL (ref 0.00–0.07)
Basophils Absolute: 0 10*3/uL (ref 0.0–0.1)
Basophils Relative: 0 %
Eosinophils Absolute: 0.1 10*3/uL (ref 0.0–0.5)
Eosinophils Relative: 2 %
HCT: 29.2 % — ABNORMAL LOW (ref 36.0–46.0)
Hemoglobin: 9 g/dL — ABNORMAL LOW (ref 12.0–15.0)
Immature Granulocytes: 0 %
Lymphocytes Relative: 37 %
Lymphs Abs: 1.9 10*3/uL (ref 0.7–4.0)
MCH: 28.8 pg (ref 26.0–34.0)
MCHC: 30.8 g/dL (ref 30.0–36.0)
MCV: 93.3 fL (ref 80.0–100.0)
Monocytes Absolute: 0.5 10*3/uL (ref 0.1–1.0)
Monocytes Relative: 10 %
Neutro Abs: 2.6 10*3/uL (ref 1.7–7.7)
Neutrophils Relative %: 51 %
Platelets: 291 10*3/uL (ref 150–400)
RBC: 3.13 MIL/uL — ABNORMAL LOW (ref 3.87–5.11)
RDW: 15.8 % — ABNORMAL HIGH (ref 11.5–15.5)
WBC: 5.1 10*3/uL (ref 4.0–10.5)
nRBC: 0 % (ref 0.0–0.2)

## 2020-06-25 LAB — IRON AND TIBC
Iron: 29 ug/dL (ref 28–170)
Saturation Ratios: 24 % (ref 10.4–31.8)
TIBC: 120 ug/dL — ABNORMAL LOW (ref 250–450)
UIBC: 91 ug/dL

## 2020-06-25 LAB — FERRITIN: Ferritin: 693 ng/mL — ABNORMAL HIGH (ref 11–307)

## 2020-06-25 MED ORDER — MEGESTROL ACETATE 400 MG/10ML PO SUSP: 400.0000 mg | Freq: Two times a day (BID) | ORAL | 1 refills | Status: DC

## 2020-06-25 NOTE — Patient Instructions (Signed)
[place] at [facility] Discharge Instructions  You were seen today by Dr. Delton Coombes. He went over your recent results. Stop taking the Marinol; you will be prescribed liquid Megace to take 1 tablespoon twice a day. You will be scheduled for a PET scan before your next visit. Dr. Delton Coombes will see you back after the scan for labs and follow up.   Thank you for choosing [place] at [facility] to provide your oncology and hematology care.  To afford each patient quality time with our provider, please arrive at least 15 minutes before your scheduled appointment time.   If you have a lab appointment with the [facility] please come in thru the Main Entrance and check in at the main information desk  You need to re-schedule your appointment should you arrive 10 or more minutes late.  We strive to give you quality time with our providers, and arriving late affects you and other patients whose appointments are after yours.  Also, if you no show three or more times for appointments you may be dismissed from the clinic at the providers discretion.     Again, thank you for choosing [facility].  Our hope is that these requests will decrease the amount of time that you wait before being seen by our physicians.       _____________________________________________________________  Should you have questions after your visit to [facility], please contact our office at [phone] between the hours of 8:00 a.m. and 4:30 p.m.  Voicemails left after 4:00 p.m. will not be returned until the following business day.  For prescription refill requests, have your pharmacy contact our office and allow 72 hours.    Cancer Center Support Programs:   > Cancer Support Group  2nd Tuesday of the month 1pm-2pm, Journey Room

## 2020-06-25 NOTE — Telephone Encounter (Signed)
Patient came into office for weight check. She is at 96.4lbs.

## 2020-06-25 NOTE — Progress Notes (Signed)
[place], [place] [address]   CLINIC:  Medical Oncology/Hematology  PCP:  Loman Brooklyn, [place] / [place] [address]  [phone]  REASON FOR VISIT:  Follow-up for monoclonal gammopathy & normocytic anemia  PRIOR THERAPY: None  CURRENT THERAPY: Under work-up  INTERVAL HISTORY:  Ms. TAKESHIA Beasley, a 64 y.o. female, returns for routine follow-up for her monoclonal gammopathy and normocytic anemia. Erica Beasley was last seen on 05/13/2020.  Today she reports feeling bad. Her appetite and energy levels are severely decreased. The Marinol is not helping improve her appetite. She had 3 colon biopsies done but nothing was found and she was diagnosed with IBS.   REVIEW OF SYSTEMS:  Review of Systems  Constitutional: Positive for appetite change (depleted) and fatigue (depleted).  HENT:   Positive for trouble swallowing (trouble chewing).   Gastrointestinal: Positive for diarrhea.  All other systems reviewed and are negative.   PAST MEDICAL/SURGICAL HISTORY:  Past Medical History:  Diagnosis Date  . COPD (chronic obstructive pulmonary disease) ([place])   . COVID-19 06/2019  . Depression   . Diabetes ([place])   . Hyperlipidemia   . Hypothyroidism   . Neuropathy   . Rheumatoid arthritis [facility])    Past Surgical History:  Procedure Laterality Date  . BIOPSY  09/16/2019   Procedure: BIOPSY;  Surgeon: Daneil Dolin, MD;  [facility];  Service: Endoscopy;;  . BIOPSY  04/28/2020   Procedure: BIOPSY;  Surgeon: Daneil Dolin, MD;  [facility];  Service: Endoscopy;;  . BIOPSY  06/08/2020   Procedure: BIOPSY;  Surgeon: Daneil Dolin, MD;  [facility];  Service: Endoscopy;;  . CATARACT EXTRACTION Bilateral   . COLONOSCOPY  09/2010   Dr. Posey Pronto: mild diverticulsis in sigmoid colon.   . COLONOSCOPY WITH PROPOFOL N/A 09/16/2019   Rourk: Diverticulosis, random colon biopsies negative for microscopic  colitis.  . COLONOSCOPY WITH PROPOFOL N/A 06/08/2020   Procedure: COLONOSCOPY WITH PROPOFOL;  Surgeon: Daneil Dolin, MD;  [facility];  Service: Endoscopy;  Laterality: N/A;  9:15am  . ESOPHAGOGASTRODUODENOSCOPY (EGD) WITH PROPOFOL N/A 04/28/2020   Procedure: ESOPHAGOGASTRODUODENOSCOPY (EGD) WITH PROPOFOL;  Surgeon: Daneil Dolin, MD;  [facility];  Service: Endoscopy;  Laterality: N/A;  2:15  . YAG LASER APPLICATION Left 79/89/2119   Procedure: YAG LASER APPLICATION;  Surgeon: Williams Che, MD;  Location: [facility];  Service: Ophthalmology;  Laterality: Left;    SOCIAL HISTORY:  Social History   Socioeconomic History  . Marital status: Widowed    Spouse name: Not on file  . Number of children: 1  . Years of education: Not on file  . Highest education level: Not on file  Occupational History  . Occupation: DISABLED  Tobacco Use  . Smoking status: Former Smoker    Packs/day: 1.00    Years: 15.00    Pack years: 15.00    Types: Cigarettes    Quit date: 09/11/2017    Years since quitting: 2.7  . Smokeless tobacco: Former Network engineer  . Vaping Use: Never used  Substance and Sexual Activity  . Alcohol use: Never  . Drug use: Never  . Sexual activity: Not Currently  Other Topics Concern  . Not on file  Social History Narrative  . Not on file   Social Determinants of Health   Financial Resource Strain:   . Difficulty of Paying Living  Expenses: Not on file  Food Insecurity:   . Worried About Charity fundraiser in the Last Year: Not on file  . Ran Out of Food in the Last Year: Not on file  Transportation Needs:   . Lack of Transportation (Medical): Not on file  . Lack of Transportation (Non-Medical): Not on file  Physical Activity:   . Days of Exercise per Week: Not on file  . Minutes of Exercise per Session: Not on file  Stress:   . Feeling of Stress : Not on file  Social Connections:   . Frequency of Communication with Friends and  Family: Not on file  . Frequency of Social Gatherings with Friends and Family: Not on file  . Attends Religious Services: Not on file  . Active Member of Clubs or Organizations: Not on file  . Attends Archivist Meetings: Not on file  . Marital Status: Not on file  Intimate Partner Violence:   . Fear of Current or Ex-Partner: Not on file  . Emotionally Abused: Not on file  . Physically Abused: Not on file  . Sexually Abused: Not on file    FAMILY HISTORY:  Family History  Problem Relation Age of Onset  . Stroke Mother   . Heart disease Mother   . Diabetes Mother   . Breast cancer Mother   . Other Father        stomach taken out for some reason  . Migraines Daughter   . Colon cancer Neg Hx     CURRENT MEDICATIONS:  Current Outpatient Medications  Medication Sig Dispense Refill  . Cholecalciferol (VITAMIN D3) 125 MCG (5000 UT) TABS Take 1 tablet (5,000 Units total) by mouth daily. 30 tablet 3  . citalopram (CELEXA) 20 MG tablet Take 20 mg by mouth daily.    Marland Kitchen dicyclomine (BENTYL) 10 MG capsule Take 1 capsule (10 mg total) by mouth 2 (two) times daily as needed for spasms. 60 capsule 3  . dronabinol (MARINOL) 2.5 MG capsule TAKE 1 CAPSULE BY MOUTH TWICE DAILY BEFORE MEAL(S) 60 capsule 3  . ferrous sulfate 325 (65 FE) MG tablet Take 325 mg by mouth daily with breakfast.    . gabapentin (NEURONTIN) 300 MG capsule Take 1 capsule (300 mg total) by mouth 3 (three) times daily. (Patient taking differently: Take 300-600 mg by mouth See admin instructions. Take 300 mg in the morning and 600 mg at night) 90 capsule 3  . hydroxychloroquine (PLAQUENIL) 200 MG tablet Take 200 mg by mouth daily.     Marland Kitchen leflunomide (ARAVA) 20 MG tablet Take 20 mg by mouth daily.    Marland Kitchen levothyroxine (SYNTHROID) 100 MCG tablet Take 100 mcg by mouth every morning.    . loperamide (IMODIUM A-D) 2 MG tablet Take 1 tablet (2 mg total) by mouth in the morning. Take second dose if needed (Patient taking  differently: Take 2 mg by mouth See admin instructions. Take 2 mg in the morning and may take a second 2 mg dose as needed for diarrhea) 30 tablet 3  . ondansetron (ZOFRAN ODT) 4 MG disintegrating tablet Take 1 tablet (4 mg total) by mouth every 6 (six) hours. 120 tablet 1  . Pancrelipase, Lip-Prot-Amyl, (CREON) 24000-76000 units CPEP Take 48,000 units (2 capsules) with meals and 24000 units (1 capsule) with snacks (Patient taking differently: Take 24,000-48,000 Units by mouth See admin instructions. Take 48,000 units (2 capsules) with meals and 24000 units (1 capsule) with snacks) 240 capsule 2  .  pantoprazole (PROTONIX) 40 MG tablet Take 1 tablet (40 mg total) by mouth daily before breakfast. 90 tablet 1  . pravastatin (PRAVACHOL) 40 MG tablet Take 1 tablet (40 mg total) by mouth at bedtime. 30 tablet 3  . megestrol (MEGACE) 400 MG/10ML suspension Take 10 mLs (400 mg total) by mouth 2 (two) times daily. 480 mL 1   No current facility-administered medications for this visit.    ALLERGIES:  No Known Allergies  PHYSICAL EXAM:  Performance status (ECOG): 1 - Symptomatic but completely ambulatory  Vitals:   06/25/20 1509  BP: (!) 157/80  Pulse: 77  Resp: 16  Temp: (!) 96.9 F (36.1 C)  SpO2: 100%   Wt Readings from Last 3 Encounters:  06/25/20 97 lb (44 kg)  06/04/20 100 lb (45.4 kg)  06/02/20 99 lb 12.8 oz (45.3 kg)   Physical Exam Vitals reviewed.  Constitutional:      Appearance: Normal appearance.  Cardiovascular:     Rate and Rhythm: Normal rate and regular rhythm.     Pulses: Normal pulses.     Heart sounds: Normal heart sounds.  Pulmonary:     Effort: Pulmonary effort is normal.     Breath sounds: Normal breath sounds.  Neurological:     General: No focal deficit present.     Mental Status: She is alert and oriented to person, place, and time.  Psychiatric:        Mood and Affect: Mood normal.        Behavior: Behavior normal.     LABORATORY DATA:  I have  reviewed the labs as listed.  CBC Latest Ref Rng & Units 06/25/2020 06/04/2020 05/07/2020  WBC 4.0 - 10.5 K/uL 5.1 4.7 5.2  Hemoglobin 12.0 - 15.0 g/dL 9.0(L) 8.6(L) 9.3(L)  Hematocrit 36 - 46 % 29.2(L) 29.5(L) 29.0(L)  Platelets 150 - 400 K/uL 291 356 376   CMP Latest Ref Rng & Units 06/25/2020 06/04/2020 05/11/2020  Glucose 70 - 99 mg/dL 85 175(H) -  BUN 8 - 23 mg/dL 5(L) 6(L) -  Creatinine 0.44 - 1.00 mg/dL 0.70 0.67 0.80  Sodium 135 - 145 mmol/L 135 139 -  Potassium 3.5 - 5.1 mmol/L 3.0(L) 3.8 -  Chloride 98 - 111 mmol/L 99 103 -  CO2 22 - 32 mmol/L 26 25 -  Calcium 8.9 - 10.3 mg/dL 7.5(L) 8.0(L) -  Total Protein 6.5 - 8.1 g/dL 6.0(L) 5.4(L) -  Total Bilirubin 0.3 - 1.2 mg/dL 0.3 0.4 -  Alkaline Phos 38 - 126 U/L 168(H) 134(H) -  AST 15 - 41 U/L 21 21 -  ALT 0 - 44 U/L 10 10 -      Component Value Date/Time   RBC 3.13 (L) 06/25/2020 1356   MCV 93.3 06/25/2020 1356   MCV 85 05/07/2020 1246   MCH 28.8 06/25/2020 1356   MCHC 30.8 06/25/2020 1356   RDW 15.8 (H) 06/25/2020 1356   RDW 13.4 05/07/2020 1246   LYMPHSABS 1.9 06/25/2020 1356   LYMPHSABS 1.8 05/07/2020 1246   MONOABS 0.5 06/25/2020 1356   EOSABS 0.1 06/25/2020 1356   EOSABS 0.1 05/07/2020 1246   BASOSABS 0.0 06/25/2020 1356   BASOSABS 0.0 05/07/2020 1246   Lab Results  Component Value Date   TIBC 120 (L) 06/25/2020   TIBC 231 (L) 03/10/2020   FERRITIN 693 (H) 06/25/2020   FERRITIN 700 (H) 04/03/2020   FERRITIN 527 (H) 03/10/2020   IRONPCTSAT 24 06/25/2020   IRONPCTSAT 15 03/10/2020    DIAGNOSTIC  IMAGING:  I have independently reviewed the scans and discussed with the patient. No results found.   ASSESSMENT:  1. Bence-Jones proteinuria: -24-hour urine done by Dr. Theador Hawthorne showed urine immunofixation positive for Bence-Jones proteinuria, kappa type. 24-hour total protein was 77 mg/day. -Free kappa light chains were 52.9, lambda light chains 29.8 with ratio of 1.78. Serum immunofixation was unremarkable. SPEP  did not show any evidence of M spike. -Labs on 04/03/2020 shows negative immunofixation.  Kappa light chain 61.3, lambda light chains 32.3, ratio 1.9. -Skeletal survey on 04/03/2020 was negative for lytic lesions.  2. Weight loss: -25 pound weight loss in the last 2 months. -She smoked cigarettes 1 pack/day for 46 years, and quit 3 years ago. -She reports that she cannot eat due to decreased appetite. Also feels very nauseous on smelling of foods. -EGD on 04/28/2020 with stomach and duodenal biopsy negative. -CT CAP on 05/11/2020 showed long segment colonic thickening likely diverticular disease.  Stable small juxtapleural nodule in the right upper chest benign over 1 year.  Moderate large fat-containing left inguinal hernia.  Emphysema and diverticular calcification. -Colonoscopy on 06/08/2020 shows pancolonic diverticulosis, mild.  Normal-appearing colonic mucosa otherwise.  Random biopsies were negative.  3. Normocytic anemia: -CBC on 03/20/2020 shows hemoglobin 9.9 with MCV of 93. White count and platelets were normal. -Colonoscopy on 09/16/2019 shows diverticulosis in the entire examined colon. Nonbleeding internal hemorrhoids.  4. Rheumatoid arthritis: -She is on Plaquenil and Areva for the last 4 years.   PLAN:  1. Bence-Jones proteinuria: -Labs today creatinine 0.7 calcium 7.5.  Hemoglobin 9.0. -We will monitor urine proteins in 3 months.  2. Weight loss: -She lost another 5 pounds since last visit.  Albumin is 2.3. -Reviewed colonoscopy results.  Not clear why she is losing weight. -We will schedule PET CT scan. -We will start Megace 400 mg twice daily.  Discussed side effects.  3. Normocytic anemia: -Anemia of chronic inflammation.  Hemoglobin today is 9.0.  4.  Depression: -She is taking Celexa 40 mg daily.  She was on Celexa 20 mg daily for several years, which increased at last visit.  Did not make a major difference so far.  Will consider change in therapy if  there is no improvement.  Orders placed this encounter:  No orders of the defined types were placed in this encounter.    Derek Jack, MD [place] [phone]   I, Milinda Antis, am acting as a scribe for Dr. Sanda Linger.  I, Derek Jack MD, have reviewed the above documentation for accuracy and completeness, and I agree with the above.

## 2020-07-02 NOTE — Telephone Encounter (Signed)
Her weight is down a few pounds. Please make sure she's drinking Ensure as recommended.   Let's recheck weight in another 2 weeks.

## 2020-07-02 NOTE — Telephone Encounter (Signed)
Patient aware of recommendations. Nothing further needed

## 2020-07-06 ENCOUNTER — Other Ambulatory Visit: Payer: Self-pay | Admitting: Gastroenterology

## 2020-07-06 ENCOUNTER — Encounter (HOSPITAL_COMMUNITY): Payer: 59

## 2020-07-09 ENCOUNTER — Ambulatory Visit (HOSPITAL_COMMUNITY): Payer: 59 | Admitting: Hematology

## 2020-07-10 ENCOUNTER — Ambulatory Visit (INDEPENDENT_AMBULATORY_CARE_PROVIDER_SITE_OTHER): Payer: 59

## 2020-07-10 ENCOUNTER — Other Ambulatory Visit: Payer: Self-pay

## 2020-07-10 DIAGNOSIS — Z23 Encounter for immunization: Secondary | ICD-10-CM

## 2020-07-10 DIAGNOSIS — Z1231 Encounter for screening mammogram for malignant neoplasm of breast: Principal | ICD-10-CM

## 2020-07-14 ENCOUNTER — Other Ambulatory Visit: Payer: Self-pay

## 2020-07-14 ENCOUNTER — Other Ambulatory Visit (HOSPITAL_COMMUNITY): Payer: Self-pay | Admitting: Family Medicine

## 2020-07-14 ENCOUNTER — Inpatient Hospital Stay (HOSPITAL_COMMUNITY): Payer: 59 | Attending: Hematology | Admitting: Hematology

## 2020-07-14 VITALS — BP 158/84 | HR 86 | Temp 96.9°F | Resp 16 | Wt 97.6 lb

## 2020-07-14 DIAGNOSIS — F329 Major depressive disorder, single episode, unspecified: Secondary | ICD-10-CM | POA: Diagnosis not present

## 2020-07-14 DIAGNOSIS — K648 Other hemorrhoids: Secondary | ICD-10-CM | POA: Insufficient documentation

## 2020-07-14 DIAGNOSIS — Z803 Family history of malignant neoplasm of breast: Secondary | ICD-10-CM | POA: Diagnosis not present

## 2020-07-14 DIAGNOSIS — D649 Anemia, unspecified: Secondary | ICD-10-CM | POA: Insufficient documentation

## 2020-07-14 DIAGNOSIS — K5730 Diverticulosis of large intestine without perforation or abscess without bleeding: Secondary | ICD-10-CM | POA: Diagnosis not present

## 2020-07-14 DIAGNOSIS — Z1231 Encounter for screening mammogram for malignant neoplasm of breast: Secondary | ICD-10-CM

## 2020-07-14 DIAGNOSIS — M069 Rheumatoid arthritis, unspecified: Secondary | ICD-10-CM | POA: Diagnosis not present

## 2020-07-14 DIAGNOSIS — R803 Bence Jones proteinuria: Secondary | ICD-10-CM | POA: Insufficient documentation

## 2020-07-14 DIAGNOSIS — Z79899 Other long term (current) drug therapy: Secondary | ICD-10-CM | POA: Insufficient documentation

## 2020-07-14 DIAGNOSIS — Z87891 Personal history of nicotine dependence: Secondary | ICD-10-CM | POA: Insufficient documentation

## 2020-07-14 DIAGNOSIS — R634 Abnormal weight loss: Secondary | ICD-10-CM | POA: Insufficient documentation

## 2020-07-14 DIAGNOSIS — D472 Monoclonal gammopathy: Secondary | ICD-10-CM

## 2020-07-14 NOTE — Progress Notes (Signed)
[address], [place] [address]   CLINIC:  Medical Oncology/Hematology  PCP:  Erica Beasley, [place] / Erica Beasley [id number]  [phone]  REASON FOR VISIT:  Follow-up for monoclonal gammopathy & normocytic anemia  PRIOR THERAPY: None  CURRENT THERAPY: Under work-up  INTERVAL HISTORY:  [name], a 64 y.o. female, returns for routine follow-up for her monoclonal gammopathy and normocytic anemia. Erica Beasley was last seen on 06/25/2020.  Today she reports feeling okay. She reports that her appetite has improved since 10/15. She is taking Megace daily. She is also taking Creon, 2 tablets when she eats and 1 tablet with snacking. She eats 2 meals daily, mainly in the evening. She is taking 20 mg of Celexa daily since she ran out of 40 mg tablets; her mood is stable. She denies leg swelling or abdominal pain.   REVIEW OF SYSTEMS:  Review of Systems  Constitutional: Positive for appetite change (75%) and fatigue (25%).  Cardiovascular: Negative for leg swelling.  Gastrointestinal: Positive for diarrhea.  Musculoskeletal: Positive for arthralgias (6/10 L hip and shoulders pain).  Neurological: Positive for headaches.  All other systems reviewed and are negative.   PAST MEDICAL/SURGICAL HISTORY:  Past Medical History:  Diagnosis Date  . COPD (chronic obstructive pulmonary disease) ([place])   . COVID-19 06/2019  . Depression   . Diabetes ([place])   . Hyperlipidemia   . Hypothyroidism   . Neuropathy   . Rheumatoid arthritis [facility])    Past Surgical History:  Procedure Laterality Date  . BIOPSY  09/16/2019   Procedure: BIOPSY;  Surgeon: Erica Dolin, MD;  [facility]: [facility];  Service: Endoscopy;;  . BIOPSY  04/28/2020   Procedure: BIOPSY;  Surgeon: Erica Dolin, MD;  [facility];  Service: Endoscopy;;  . BIOPSY  06/08/2020   Procedure: BIOPSY;  Surgeon: Erica Dolin, MD;  [facility];  Service:  Endoscopy;;  . CATARACT EXTRACTION Bilateral   . COLONOSCOPY  09/2010   Dr. Posey Beasley: mild diverticulsis in sigmoid colon.   . COLONOSCOPY WITH PROPOFOL N/A 09/16/2019   Erica Beasley: Diverticulosis, random colon biopsies negative for microscopic colitis.  . COLONOSCOPY WITH PROPOFOL N/A 06/08/2020   Procedure: COLONOSCOPY WITH PROPOFOL;  Surgeon: Erica Dolin, MD;  [facility];  Service: Endoscopy;  Laterality: N/A;  9:15am  . ESOPHAGOGASTRODUODENOSCOPY (EGD) WITH PROPOFOL N/A 04/28/2020   Procedure: ESOPHAGOGASTRODUODENOSCOPY (EGD) WITH PROPOFOL;  Surgeon: Erica Dolin, MD;  [facility];  Service: Endoscopy;  Laterality: N/A;  2:15  . YAG LASER APPLICATION Left 91/63/8466   Procedure: YAG LASER APPLICATION;  Surgeon: Erica Che, MD;  Location: [facility];  Service: Ophthalmology;  Laterality: Left;    SOCIAL HISTORY:  Social History   Socioeconomic History  . Marital status: Widowed    Spouse name: Not on file  . Number of children: 1  . Years of education: Not on file  . Highest education level: Not on file  Occupational History  . Occupation: DISABLED  Tobacco Use  . Smoking status: Former Smoker    Packs/day: 1.00    Years: 15.00    Pack years: 15.00    Types: Cigarettes    Quit date: 09/11/2017    Years since quitting: 2.8  . Smokeless tobacco: Former Network engineer  . Vaping Use: Never used  Substance and Sexual Activity  . Alcohol use: Never  . Drug  use: Never  . Sexual activity: Not Currently  Other Topics Concern  . Not on file  Social History Narrative  . Not on file   Social Determinants of Health   Financial Resource Strain:   . Difficulty of Paying Living Expenses: Not on file  Food Insecurity:   . Worried About Charity fundraiser in the Last Year: Not on file  . Ran Out of Food in the Last Year: Not on file  Transportation Needs:   . Lack of Transportation (Medical): Not on file  . Lack of Transportation (Non-Medical): Not on  file  Physical Activity:   . Days of Exercise per Week: Not on file  . Minutes of Exercise per Session: Not on file  Stress:   . Feeling of Stress : Not on file  Social Connections:   . Frequency of Communication with Friends and Family: Not on file  . Frequency of Social Gatherings with Friends and Family: Not on file  . Attends Religious Services: Not on file  . Active Member of Clubs or Organizations: Not on file  . Attends Archivist Meetings: Not on file  . Marital Status: Not on file  Intimate Partner Violence:   . Fear of Current or Ex-Partner: Not on file  . Emotionally Abused: Not on file  . Physically Abused: Not on file  . Sexually Abused: Not on file    FAMILY HISTORY:  Family History  Problem Relation Age of Onset  . Stroke Mother   . Heart disease Mother   . Diabetes Mother   . Breast cancer Mother   . Other Father        stomach taken out for some reason  . Migraines Daughter   . Colon cancer Neg Hx     CURRENT MEDICATIONS:  Current Outpatient Medications  Medication Sig Dispense Refill  . Cholecalciferol (VITAMIN D3) 125 MCG (5000 UT) TABS Take 1 tablet (5,000 Units total) by mouth daily. 30 tablet 3  . citalopram (CELEXA) 20 MG tablet Take 20 mg by mouth daily.    Marland Kitchen dicyclomine (BENTYL) 10 MG capsule Take 1 capsule (10 mg total) by mouth 2 (two) times daily as needed for spasms. 60 capsule 3  . ferrous sulfate 325 (65 FE) MG tablet Take 325 mg by mouth daily with breakfast.    . gabapentin (NEURONTIN) 300 MG capsule Take 1 capsule (300 mg total) by mouth 3 (three) times daily. (Patient taking differently: Take 300-600 mg by mouth See admin instructions. Take 300 mg in the morning and 600 mg at night) 90 capsule 3  . hydroxychloroquine (PLAQUENIL) 200 MG tablet Take 200 mg by mouth daily.     Marland Kitchen leflunomide (ARAVA) 20 MG tablet Take 20 mg by mouth daily.    Marland Kitchen levothyroxine (SYNTHROID) 100 MCG tablet Take 100 mcg by mouth every morning.    .  loperamide (IMODIUM A-D) 2 MG tablet Take 1 tablet (2 mg total) by mouth in the morning. Take second dose if needed (Patient taking differently: Take 2 mg by mouth See admin instructions. Take 2 mg in the morning and may take a second 2 mg dose as needed for diarrhea) 30 tablet 3  . megestrol (MEGACE) 400 MG/10ML suspension Take 10 mLs (400 mg total) by mouth 2 (two) times daily. 480 mL 1  . Pancrelipase, Lip-Prot-Amyl, (CREON) 24000-76000 units CPEP Take 48,000 units (2 capsules) with meals and 24000 units (1 capsule) with snacks (Patient taking differently: Take 24,000-48,000 Units by  mouth See admin instructions. Take 48,000 units (2 capsules) with meals and 24000 units (1 capsule) with snacks) 240 capsule 2  . pantoprazole (PROTONIX) 40 MG tablet TAKE ONE TABLET BY MOUTH DAILY BEFORE BREAKFAST 90 tablet 1  . pravastatin (PRAVACHOL) 40 MG tablet Take 1 tablet (40 mg total) by mouth at bedtime. 30 tablet 3  . ondansetron (ZOFRAN ODT) 4 MG disintegrating tablet Take 1 tablet (4 mg total) by mouth every 6 (six) hours. (Patient not taking: Reported on 07/14/2020) 120 tablet 1   No current facility-administered medications for this visit.    ALLERGIES:  No Known Allergies  PHYSICAL EXAM:  Performance status (ECOG): 1 - Symptomatic but completely ambulatory  Vitals:   07/14/20 1148  BP: (!) 158/84  Pulse: 86  Resp: 16  Temp: (!) 96.9 F (36.1 C)  SpO2: 99%   Wt Readings from Last 3 Encounters:  07/14/20 97 lb 9.6 oz (44.3 kg)  06/25/20 97 lb (44 kg)  06/04/20 100 lb (45.4 kg)   Physical Exam Vitals reviewed.  Constitutional:      Appearance: Normal appearance.  Cardiovascular:     Rate and Rhythm: Normal rate and regular rhythm.     Pulses: Normal pulses.     Heart sounds: Normal heart sounds.  Pulmonary:     Effort: Pulmonary effort is normal.     Breath sounds: Normal breath sounds.  Abdominal:     Palpations: Abdomen is soft. There is no mass.     Tenderness: There is no  abdominal tenderness.  Musculoskeletal:     Right lower leg: No edema.     Left lower leg: No edema.  Neurological:     General: No focal deficit present.     Mental Status: She is alert and oriented to person, place, and time.  Psychiatric:        Mood and Affect: Mood normal.        Behavior: Behavior normal.     LABORATORY DATA:  I have reviewed the labs as listed.  CBC Latest Ref Rng & Units 06/25/2020 06/04/2020 05/07/2020  WBC 4.0 - 10.5 K/uL 5.1 4.7 5.2  Hemoglobin 12.0 - 15.0 g/dL 9.0(L) 8.6(L) 9.3(L)  Hematocrit 36 - 46 % 29.2(L) 29.5(L) 29.0(L)  Platelets 150 - 400 K/uL 291 356 376   CMP Latest Ref Rng & Units 06/25/2020 06/04/2020 05/11/2020  Glucose 70 - 99 mg/dL 85 175(H) -  BUN 8 - 23 mg/dL 5(L) 6(L) -  Creatinine 0.44 - 1.00 mg/dL 0.70 0.67 0.80  Sodium 135 - 145 mmol/L 135 139 -  Potassium 3.5 - 5.1 mmol/L 3.0(L) 3.8 -  Chloride 98 - 111 mmol/L 99 103 -  CO2 22 - 32 mmol/L 26 25 -  Calcium 8.9 - 10.3 mg/dL 7.5(L) 8.0(L) -  Total Protein 6.5 - 8.1 g/dL 6.0(L) 5.4(L) -  Total Bilirubin 0.3 - 1.2 mg/dL 0.3 0.4 -  Alkaline Phos 38 - 126 U/L 168(H) 134(H) -  AST 15 - 41 U/L 21 21 -  ALT 0 - 44 U/L 10 10 -      Component Value Date/Time   RBC 3.13 (L) 06/25/2020 1356   MCV 93.3 06/25/2020 1356   MCV 85 05/07/2020 1246   MCH 28.8 06/25/2020 1356   MCHC 30.8 06/25/2020 1356   RDW 15.8 (H) 06/25/2020 1356   RDW 13.4 05/07/2020 1246   LYMPHSABS 1.9 06/25/2020 1356   LYMPHSABS 1.8 05/07/2020 1246   MONOABS 0.5 06/25/2020 1356   EOSABS 0.1  06/25/2020 1356   EOSABS 0.1 05/07/2020 1246   BASOSABS 0.0 06/25/2020 1356   BASOSABS 0.0 05/07/2020 1246   Lab Results  Component Value Date   TIBC 120 (L) 06/25/2020   TIBC 231 (L) 03/10/2020   FERRITIN 693 (H) 06/25/2020   FERRITIN 700 (H) 04/03/2020   FERRITIN 527 (H) 03/10/2020   IRONPCTSAT 24 06/25/2020   IRONPCTSAT 15 03/10/2020    DIAGNOSTIC IMAGING:  I have independently reviewed the scans and discussed with  the patient. No results found.   ASSESSMENT:  1. Bence-Jones proteinuria: -24-hour urine done by Dr. Theador Hawthorne showed urine immunofixation positive for Bence-Jones proteinuria, kappa type. 24-hour total protein was 77 mg/day. -Free kappa light chains were 52.9, lambda light chains 29.8 with ratio of 1.78. Serum immunofixation was unremarkable. SPEP did not show any evidence of M spike. -Labs on 04/03/2020 shows negative immunofixation. Kappa light chain 61.3, lambda light chains 32.3, ratio 1.9. -Skeletal survey on 04/03/2020 was negative for lytic lesions.  2. Weight loss: -25 pound weight loss in the last 2 months. -She smoked cigarettes 1 pack/day for 46 years, and quit 3 years ago. -She reports that she cannot eat due to decreased appetite. Also feels very nauseous on smelling of foods. -EGD on 04/28/2020 with stomach and duodenal biopsy negative. -CT CAP on 05/11/2020 showed long segment colonic thickening likely diverticular disease. Stable small juxtapleural nodule in the right upper chest benign over 1 year. Moderate large fat-containing left inguinal hernia. Emphysema and diverticular calcification. -Colonoscopy on 06/08/2020 shows pancolonic diverticulosis, mild.  Normal-appearing colonic mucosa otherwise.  Random biopsies were negative.  3. Normocytic anemia: -CBC on 03/20/2020 shows hemoglobin 9.9 with MCV of 93. White count and platelets were normal. -Colonoscopy on 09/16/2019 shows diverticulosis in the entire examined colon. Nonbleeding internal hemorrhoids.  4. Rheumatoid arthritis: -She is on Plaquenil and Areva for the last 4 years.   PLAN:  1. Bence-Jones proteinuria: -Latest creatinine is 0.7 and calcium is 7.5 with albumin of 2.3.  Hemoglobin is 9. -We will monitor urine protein in 3 months.  2. Weight loss: -Insurance denied her PET scan. -Weight is stable since last visit.  Her appetite picked up since 07/10/2020. -Continue Megace 400 mg twice  daily. -RTC 4 weeks with labs.  3. Normocytic anemia: -Anemia of chronic inflammation.  Hemoglobin is 9.  Ferritin is 693 and percent saturation is 24.  4. Depression: -Increase Celexa to 40 mg daily.  Sent a new prescription.  Orders placed this encounter:  No orders of the defined types were placed in this encounter.    Derek Jack, MD [place] [phone]   I, Milinda Antis, am acting as a scribe for Dr. Sanda Linger.  I, Derek Jack MD, have reviewed the above documentation for accuracy and completeness, and I agree with the above.

## 2020-07-14 NOTE — Telephone Encounter (Signed)
Pt walked in office today for a weight check as directed by Walden Field, NP. Weight is 98.0.

## 2020-07-14 NOTE — Patient Instructions (Signed)
[place] at [facility] Discharge Instructions  You were seen today by Dr. Delton Coombes. He went over your recent results. Make sure to take Megace twice daily to improve your weight. You will be prescribed Celexa 40 mg to take daily.  Dr. Delton Coombes will see you back in 4 weeks for labs and follow up.   Thank you for choosing [place] at [facility] to provide your oncology and hematology care.  To afford each patient quality time with our provider, please arrive at least 15 minutes before your scheduled appointment time.   If you have a lab appointment with the [facility] please come in thru the Main Entrance and check in at the main information desk  You need to re-schedule your appointment should you arrive 10 or more minutes late.  We strive to give you quality time with our providers, and arriving late affects you and other patients whose appointments are after yours.  Also, if you no show three or more times for appointments you may be dismissed from the clinic at the providers discretion.     Again, thank you for choosing [facility].  Our hope is that these requests will decrease the amount of time that you wait before being seen by our physicians.       _____________________________________________________________  Should you have questions after your visit to [facility], please contact our office at [phone] between the hours of 8:00 a.m. and 4:30 p.m.  Voicemails left after 4:00 p.m. will not be returned until the following business day.  For prescription refill requests, have your pharmacy contact our office and allow 72 hours.    Cancer Center Support Programs:   > Cancer Support Group  2nd Tuesday of the month 1pm-2pm, Journey Room

## 2020-07-18 MED ORDER — CITALOPRAM HYDROBROMIDE 40 MG PO TABS
20.0000 mg | ORAL_TABLET | Freq: Every day | ORAL | 6 refills | Status: DC
Start: 2020-07-18 — End: 2020-09-22

## 2020-07-21 NOTE — Telephone Encounter (Signed)
Weight stable/improved compared to last (at last OV was 99 lb). Please have the patient notify us of any recurrent weight loss.

## 2020-07-21 NOTE — Telephone Encounter (Signed)
Spoke with pt. Pt is now at 101. Pt will notify our office if her weight declines.

## 2020-07-22 ENCOUNTER — Other Ambulatory Visit: Payer: Self-pay

## 2020-07-22 ENCOUNTER — Ambulatory Visit (HOSPITAL_COMMUNITY)
Admission: RE | Admit: 2020-07-22 | Discharge: 2020-07-22 | Disposition: A | Discharge status: Home / Self Care | Payer: 59 | Source: Ambulatory Visit | Attending: Family Medicine | Admitting: Family Medicine

## 2020-07-22 DIAGNOSIS — Z1231 Encounter for screening mammogram for malignant neoplasm of breast: Secondary | ICD-10-CM | POA: Diagnosis present

## 2020-07-22 IMAGING — MG DIGITAL SCREENING BILAT W/ TOMO W/ CAD
6 of 12 series · 6 of 36 positions shown · non-contrast
Comparison: Previous exam(s).

CLINICAL DATA: Screening.

EXAM:
DIGITAL SCREENING BILATERAL MAMMOGRAM WITH TOMO AND CAD

[R CC synth-2D (1 of 2)]
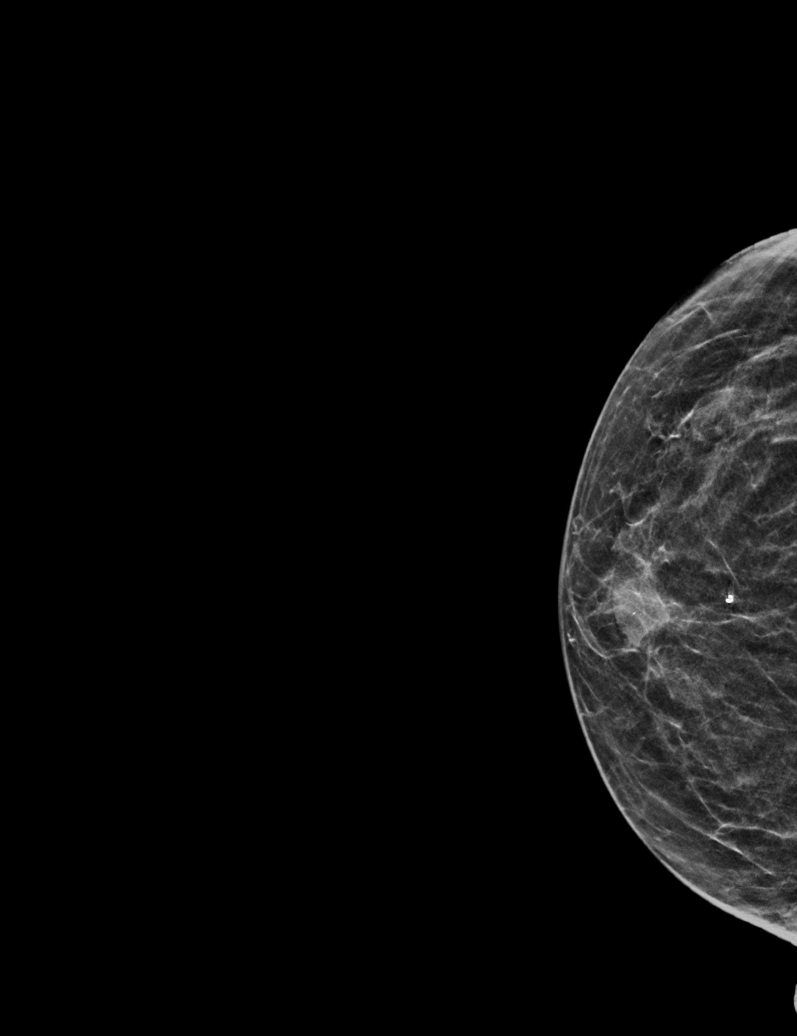

[L MLO synth-2D]
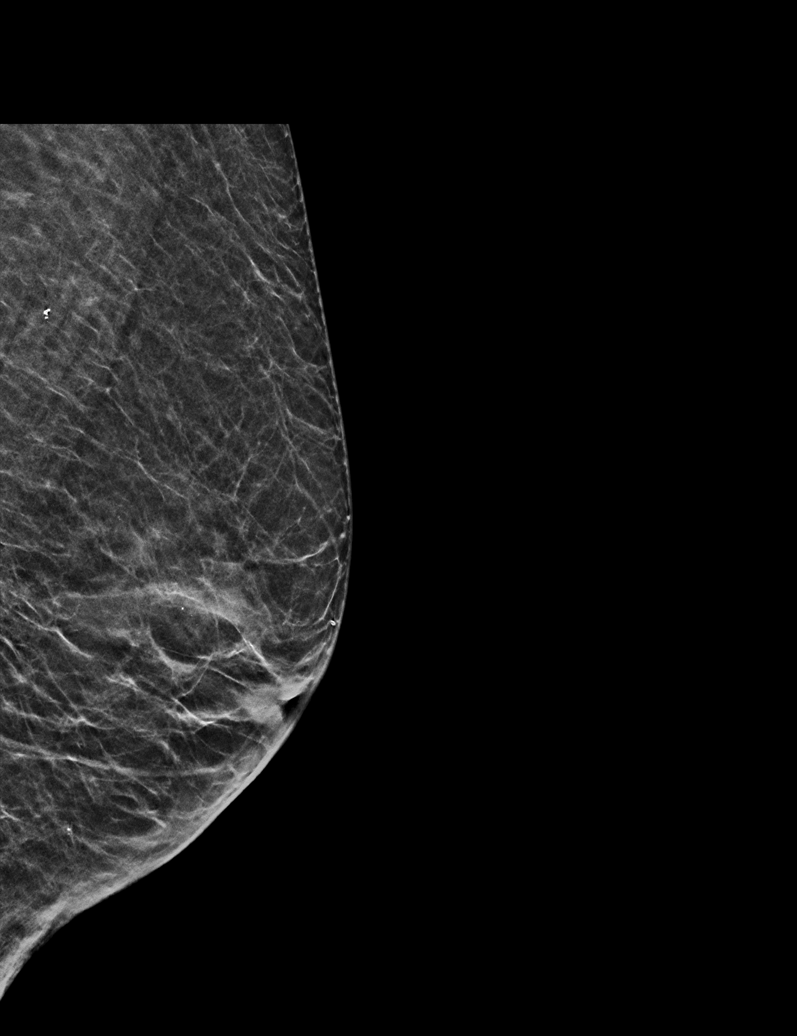

[L CC synth-2D (1 of 2)]
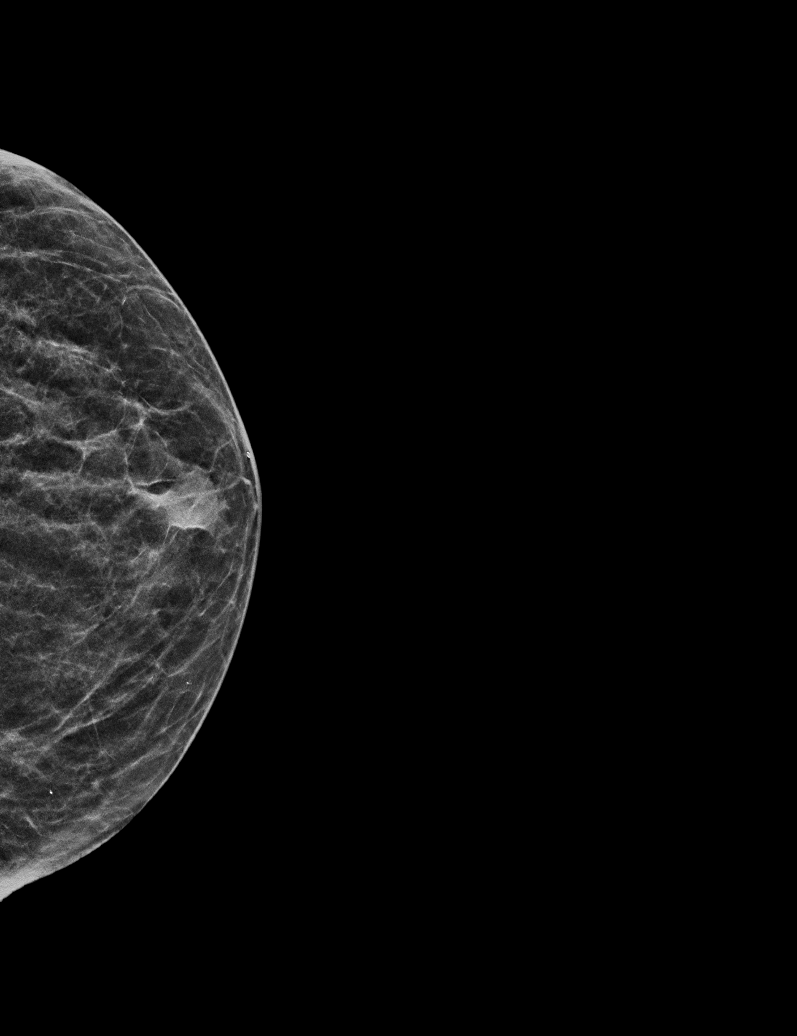

[R MLO synth-2D]
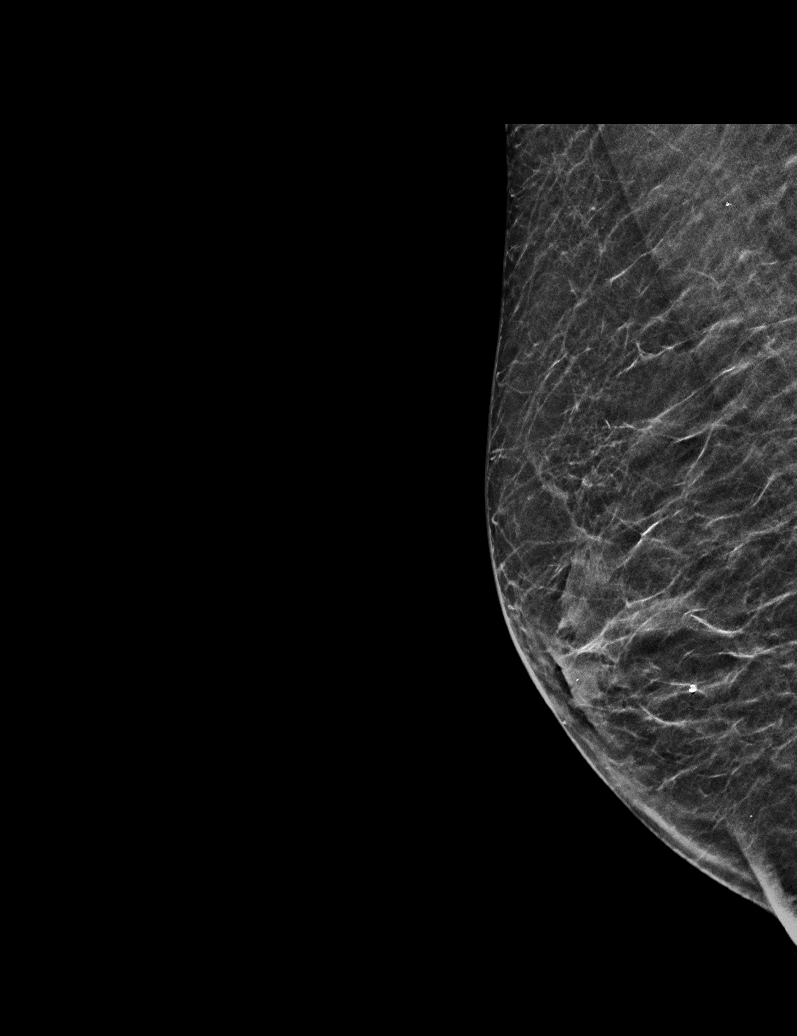

[L CC synth-2D (2 of 2)]
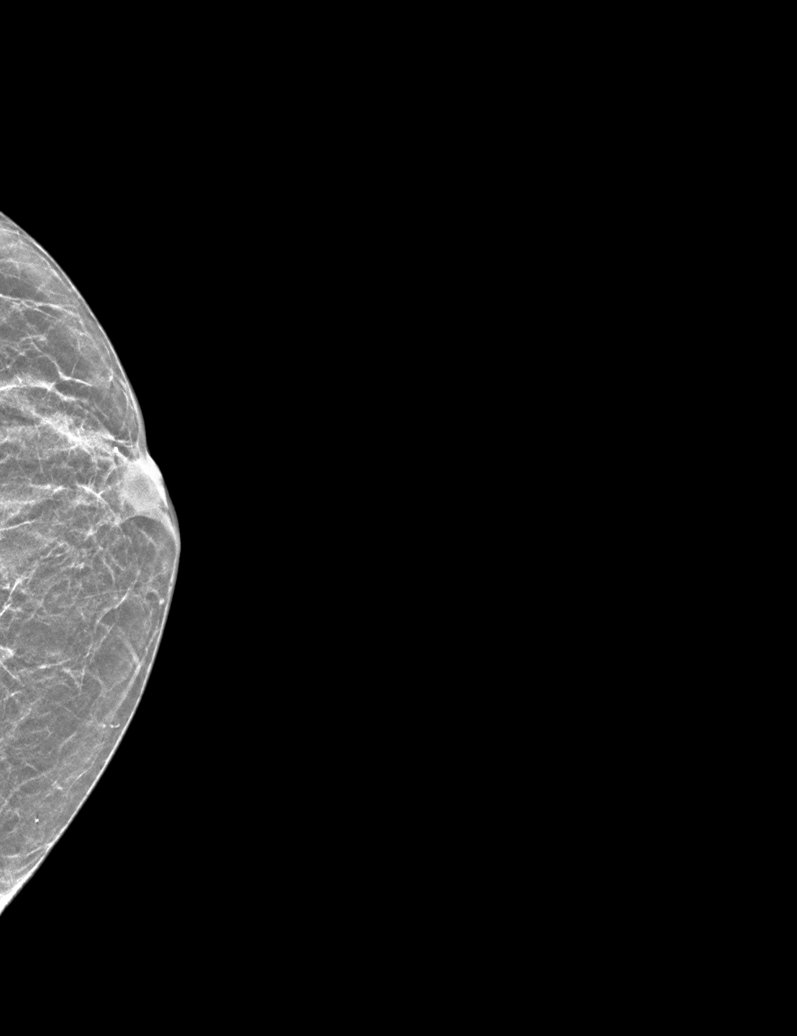

[R CC synth-2D (2 of 2)]
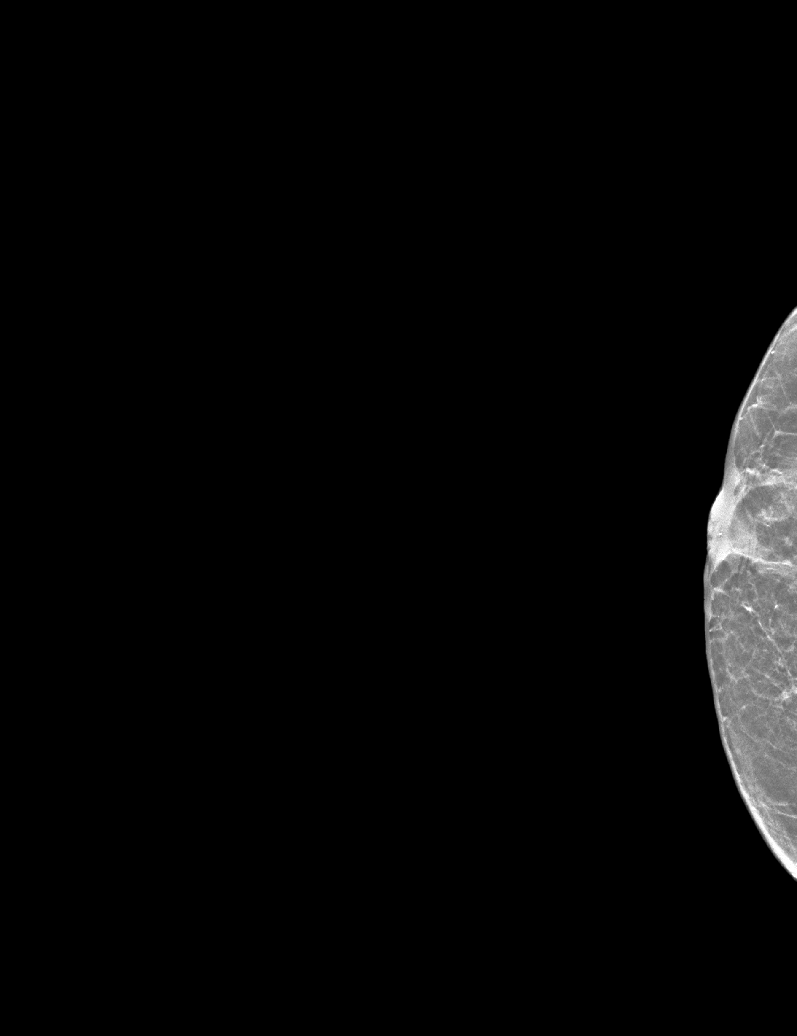

[6 of 36 positions shown; findings below may reference images not displayed]

ACR Breast Density Category b: There are scattered areas of
fibroglandular density.
FINDINGS: There are no findings suspicious for malignancy. Images were
processed with CAD.
IMPRESSION: No mammographic evidence of malignancy. A result letter of this
screening mammogram will be mailed directly to the patient.

RECOMMENDATION:
Screening mammogram in one year. (Code:[TQ])

BI-RADS CATEGORY  1: Negative.

## 2020-07-22 NOTE — Telephone Encounter (Signed)
Sounds great!

## 2020-08-05 ENCOUNTER — Ambulatory Visit (INDEPENDENT_AMBULATORY_CARE_PROVIDER_SITE_OTHER): Payer: 59 | Admitting: Nurse Practitioner

## 2020-08-05 ENCOUNTER — Other Ambulatory Visit: Payer: Self-pay

## 2020-08-05 ENCOUNTER — Encounter: Payer: Self-pay | Admitting: Nurse Practitioner

## 2020-08-05 VITALS — BP 137/85 | HR 81 | Temp 97.0°F | Ht 61.0 in | Wt 103.0 lb

## 2020-08-05 DIAGNOSIS — R112 Nausea with vomiting, unspecified: Secondary | ICD-10-CM

## 2020-08-05 DIAGNOSIS — R935 Abnormal findings on diagnostic imaging of other abdominal regions, including retroperitoneum: Secondary | ICD-10-CM

## 2020-08-05 DIAGNOSIS — R634 Abnormal weight loss: Secondary | ICD-10-CM

## 2020-08-05 DIAGNOSIS — K529 Noninfective gastroenteritis and colitis, unspecified: Secondary | ICD-10-CM | POA: Diagnosis not present

## 2020-08-05 NOTE — Patient Instructions (Signed)
Your health issues we discussed today were:   Diarrhea: 1. I am glad you are overall doing better 2. Continue taking Creon as you have been 3. Add a probiotic based on package recommendations for dosing for 1 to 2 months. We've had good success with Align, Restora, Culturelle, [employer], and [employer]; however there are many good options and you can discuss with the pharmacist if you would like further guidance.  I am told [employer] has recently released a store brand version of "Align" that is cheaper. 4. You can use Imodium and/or Bentyl (dicyclomine) as needed for any recurrent loose stools 5. Let us know if you have any worsening or severe symptoms  Weight loss: 1. I am glad you have started eating better and are subsequently putting on more weight 2. Continue to monitor your weight 3. Let us know if you have any more weight loss 4. Continue to "eat to live" with good, nutritious diet that helps you maintain a good weight  Overall I recommend:  1. Continue your other current medications 2. Return for follow-up in 3 months 3. Call us for any questions or concerns   ---------------------------------------------------------------  At [facility] we value your feedback. You may receive a survey about your visit today. Please share your experience as we strive to create trusting relationships with our patients to provide genuine, compassionate, quality care.  We appreciate your understanding and patience as we review any laboratory studies, imaging, and other diagnostic tests that are ordered as we care for you. Our office policy is 5 business days for review of these results, and any emergent or urgent results are addressed in a timely manner for your best interest. If you do not hear from our office in 1 week, please contact us.   We also encourage the use of MyChart, which contains your medical information for your review as well. If you are not enrolled in this  feature, an access code is on this after visit summary for your convenience. Thank you for allowing [place] to be involved in your care.  It was great to see you today!  I hope you have a Happy Thanksgiving!!    ---------------------------------------------------------------

## 2020-08-05 NOTE — Progress Notes (Signed)
Referring Provider: Loman Brooklyn, FNP Primary Care Physician:  Loman Brooklyn, FNP Primary GI:  Dr. Gala Romney  Chief Complaint  Patient presents with  . Diarrhea    several times per week, today has had 6 times this morning, depending on what she eats    HPI:   Erica Beasley is a 64 y.o. female who presents for follow-up on diarrhea.  Patient was last seen in our office 06/02/2020 for non-intractable nausea and vomiting, chronic diarrhea, weight loss, abnormal CT of the abdomen.  Noted history of rheumatoid arthritis, COPD, diabetes.  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  Colonoscopy in December 2020 with diverticulosis and random colon biopsies negative for microscopic colitis and recommended repeat in 5 years ([age]).  Repeat C. difficile testing in December negative but started on empiric Creon because of fatty nature of her stools.  Weight loss from 136 pounds in December 2020 down to 107 pounds at previous office visit.  Scheduled for CT of the chest, abdomen, pelvis by oncology.  Bone survey negative for focal lytic or blastic lesions.  Seems to have persistent loose/soft stools that are yellow, Imodium helps for about 24 hours.  Stools appear fatty and foul-smelling.  Had Covid in twenty twenty not fully recovered.  Not eating much solids previously.  Stop Creon because he is not taking in solid foods.  Previously recommended consider EGD with propofol if CTs are unremarkable.  EGD completed 04/28/2020 which found normal esophagus, normal stomach, duodenal erosions status post gastric and duodenal biopsies found to be antral and oxyntic mucosa with slight chronic inflammation but negative for H. pylori and benign duodenal mucosa.  CT of the chest, abdomen, pelvis completed 05/11/2020.  Findings included long segment colonic thickening likely diverticular disease  with possible low-level inflammation but no surrounding stranding and correlation with recent colonoscopy may be helpful.  Small stable juxtapleural nodule in the right upper chest that is benign based on 1 year stability, moderately large fat-containing left inguinal hernia, aortic atherosclerosis, emphysema and aortic valvular calcification.  There is a question of any need for repeat colonoscopy, the last one was completed in 2020.  At her last visit noted persistent diarrhea described as chronic over the past 4 years that initially started when she had C. difficile and never recovered.  No recurrent vomiting.  Stools 4-5 times in the morning and then will take Imodium and have no further stools.  Weight down another 8 pounds subjectively in the past 2 months.  Has to use Imodium in order to go out in public.  Typical diet includes cereal and banana gravy, peanut butter later in the day occasionally, tossed salads as well.  Not able to eat much.  Does have spaghetti at times.  Tried Ensure but this made her ill and thought that it was possibly out of date.  No other overt GI complaints.  Recommended pancreatic fecal elastase and stool studies, start Creon with weight-based dosing (48,000 units per meal and 24,000 units per snack initially), trial of Ensure or other protein supplementation at least two a day, colonoscopy to follow-up on abnormal CT, nurse visit in 4 weeks for weight check, follow-up in 2 months.  Colonoscopy completed 06/08/2020 which found mild pancolonic diverticulosis and normal-appearing colonic mucosa otherwise status post segmental biopsy and stool sampling.  Also noted hemorrhoids.  Colonic biopsies found colonic mucosa with no  significant histopathological changes.  GI pathogen panel negative.  We received a virtual message from the patient and her daughter indicating still with nausea and vomiting and diarrhea.  Recommended Bentyl, continue Imodium, Zofran every 6 hours as  needed.  Patient's daughter called our office 3 days after that indicating no longer taking Plaquenil and still with diarrhea despite Imodium in the morning and sometimes in the evening, intermittently takes Protonix.  Extensively discussed results of imaging, colonoscopy, stool studies, labs, etc. with no identifiable cause.  Doubt biliary issue due to lack of pain more likely functional bowel issues such as IBS.  Recommended Zofran ODT every 6 hours as needed, Bentyl twice daily as needed for diarrhea, continue Imodium, call for worsening.  Patient came in for a nurse nurse visit/weight check on 06/25/2020 and weight increased from 99 pounds 101 pounds.  Today she states she doing okay overall. Still with diarrhea several times a week, today specifically she has had six stools already. Loose stools depend on what she eats. States stools have been forming some. She has gained some weight because she's eating more. Yesterday had stew that cause diarrhea. Otherwise stools are usually formed at this point, but have a bad odor. Some things she eats will cause looser stool (like lettuce, stew). Took Imodium last night but hasn't needed it much in the past few months. Is putting on weight. No more urgency. Feels eating better has helped regulate things. Denies abdominal pain, hematochezia, melena, fever, chills. Nausea and vomiting has resolved. Denies URI or flu-like symptoms. Denies loss of sense of taste or smell. The patient has received COVID-19 vaccination(s). Denies chest pain, dyspnea, dizziness, lightheadedness, syncope, near syncope. Denies any other upper or lower GI symptoms.  Still taking Creon. Not taking Bentyl recently.  Past Medical History:  Diagnosis Date  . COPD (chronic obstructive pulmonary disease) ([place])   . COVID-19 06/2019  . Depression   . Diabetes ([place])   . Hyperlipidemia   . Hypothyroidism   . Neuropathy   . Rheumatoid arthritis [facility])     Past Surgical History:  Procedure  Laterality Date  . BIOPSY  09/16/2019   Procedure: BIOPSY;  Surgeon: Daneil Dolin, MD;  Location: [facility];  Service: Endoscopy;;  . BIOPSY  04/28/2020   Procedure: BIOPSY;  Surgeon: Daneil Dolin, MD;  [facility];  Service: Endoscopy;;  . BIOPSY  06/08/2020   Procedure: BIOPSY;  Surgeon: Daneil Dolin, MD;  [facility];  Service: Endoscopy;;  . CATARACT EXTRACTION Bilateral   . COLONOSCOPY  09/2010   Dr. Posey Pronto: mild diverticulsis in sigmoid colon.   . COLONOSCOPY WITH PROPOFOL N/A 09/16/2019   Rourk: Diverticulosis, random colon biopsies negative for microscopic colitis.  . COLONOSCOPY WITH PROPOFOL N/A 06/08/2020   Procedure: COLONOSCOPY WITH PROPOFOL;  Surgeon: Daneil Dolin, MD;  [facility];  Service: Endoscopy;  Laterality: N/A;  9:15am  . ESOPHAGOGASTRODUODENOSCOPY (EGD) WITH PROPOFOL N/A 04/28/2020   Procedure: ESOPHAGOGASTRODUODENOSCOPY (EGD) WITH PROPOFOL;  Surgeon: Daneil Dolin, MD;  [facility];  Service: Endoscopy;  Laterality: N/A;  2:15  . YAG LASER APPLICATION Left 15/17/6160   Procedure: YAG LASER APPLICATION;  Surgeon: Williams Che, MD;  Location: [facility];  Service: Ophthalmology;  Laterality: Left;    Current Outpatient Medications  Medication Sig Dispense Refill  . Cholecalciferol (VITAMIN D3) 125 MCG (5000 UT) TABS Take 1 tablet (5,000 Units total) by mouth daily. 30 tablet 3  .  dicyclomine (BENTYL) 10 MG capsule Take 1 capsule (10 mg total) by mouth 2 (two) times daily as needed for spasms. 60 capsule 3  . ferrous sulfate 325 (65 FE) MG tablet Take 325 mg by mouth daily with breakfast.    . gabapentin (NEURONTIN) 300 MG capsule Take 1 capsule (300 mg total) by mouth 3 (three) times daily. (Patient taking differently: Take 300-600 mg by mouth See admin instructions. Take 300 mg in the morning and 600 mg at night) 90 capsule 3  . hydroxychloroquine (PLAQUENIL) 200 MG tablet Take 200 mg by mouth daily.     Marland Kitchen  leflunomide (ARAVA) 20 MG tablet Take 20 mg by mouth daily.    Marland Kitchen levothyroxine (SYNTHROID) 100 MCG tablet Take 100 mcg by mouth every morning.    . loperamide (IMODIUM A-D) 2 MG tablet Take 1 tablet (2 mg total) by mouth in the morning. Take second dose if needed (Patient taking differently: Take 2 mg by mouth as needed. Take 2 mg in the morning and may take a second 2 mg dose as needed for diarrhea) 30 tablet 3  . megestrol (MEGACE) 400 MG/10ML suspension Take 10 mLs (400 mg total) by mouth 2 (two) times daily. 480 mL 1  . ondansetron (ZOFRAN ODT) 4 MG disintegrating tablet Take 1 tablet (4 mg total) by mouth every 6 (six) hours. 120 tablet 1  . Pancrelipase, Lip-Prot-Amyl, (CREON) 24000-76000 units CPEP Take 48,000 units (2 capsules) with meals and 24000 units (1 capsule) with snacks (Patient taking differently: Take 24,000-48,000 Units by mouth See admin instructions. Take 48,000 units (2 capsules) with meals and 24000 units (1 capsule) with snacks) 240 capsule 2  . pantoprazole (PROTONIX) 40 MG tablet TAKE ONE TABLET BY MOUTH DAILY BEFORE BREAKFAST 90 tablet 1  . pravastatin (PRAVACHOL) 40 MG tablet Take 1 tablet (40 mg total) by mouth at bedtime. 30 tablet 3  . citalopram (CELEXA) 40 MG tablet Take 0.5 tablets (20 mg total) by mouth daily. (Patient not taking: Reported on 08/05/2020) 30 tablet 6   No current facility-administered medications for this visit.    Allergies as of 08/05/2020  . (No Known Allergies)    Family History  Problem Relation Age of Onset  . Stroke Mother   . Heart disease Mother   . Diabetes Mother   . Breast cancer Mother   . Other Father        stomach taken out for some reason  . Migraines Daughter   . Colon cancer Neg Hx     Social History   Socioeconomic History  . Marital status: Widowed    Spouse name: Not on file  . Number of children: 1  . Years of education: Not on file  . Highest education level: Not on file  Occupational History  .  Occupation: DISABLED  Tobacco Use  . Smoking status: Former Smoker    Packs/day: 1.00    Years: 15.00    Pack years: 15.00    Types: Cigarettes    Quit date: 09/11/2017    Years since quitting: 2.9  . Smokeless tobacco: Former Network engineer  . Vaping Use: Never used  Substance and Sexual Activity  . Alcohol use: Never  . Drug use: Never  . Sexual activity: Not Currently  Other Topics Concern  . Not on file  Social History Narrative  . Not on file   Social Determinants of Health   Financial Resource Strain:   . Difficulty of Paying Living Expenses:  Not on file  Food Insecurity:   . Worried About Charity fundraiser in the Last Year: Not on file  . Ran Out of Food in the Last Year: Not on file  Transportation Needs:   . Lack of Transportation (Medical): Not on file  . Lack of Transportation (Non-Medical): Not on file  Physical Activity:   . Days of Exercise per Week: Not on file  . Minutes of Exercise per Session: Not on file  Stress:   . Feeling of Stress : Not on file  Social Connections:   . Frequency of Communication with Friends and Family: Not on file  . Frequency of Social Gatherings with Friends and Family: Not on file  . Attends Religious Services: Not on file  . Active Member of Clubs or Organizations: Not on file  . Attends Archivist Meetings: Not on file  . Marital Status: Not on file    Subjective: Review of Systems  Constitutional: Negative for chills, fever, malaise/fatigue and weight loss.  HENT: Negative for congestion and sore throat.   Respiratory: Negative for cough and shortness of breath.   Cardiovascular: Negative for chest pain and palpitations.  Gastrointestinal: Positive for diarrhea (significantly improved). Negative for abdominal pain, blood in stool, heartburn, melena, nausea and vomiting.  Musculoskeletal: Negative for joint pain and myalgias.  Skin: Negative for rash.  Neurological: Negative for dizziness and weakness.   Endo/Heme/Allergies: Does not bruise/bleed easily.  Psychiatric/Behavioral: Negative for depression. The patient is not nervous/anxious.   All other systems reviewed and are negative.    Objective: BP 137/85   Pulse 81   Temp (!) 97 F (36.1 C)   Ht 5\' 1"  (1.549 m)   Wt 103 lb (46.7 kg)   BMI 19.46 kg/m  Physical Exam Vitals and nursing note reviewed.  Constitutional:      General: She is not in acute distress.    Appearance: Normal appearance. She is well-developed and normal weight. She is not ill-appearing, toxic-appearing or diaphoretic.     Comments: Weight improved  HENT:     Head: Normocephalic and atraumatic.     Nose: No congestion or rhinorrhea.  Eyes:     General: No scleral icterus. Cardiovascular:     Rate and Rhythm: Normal rate and regular rhythm.     Heart sounds: Normal heart sounds.  Pulmonary:     Effort: Pulmonary effort is normal. No respiratory distress.     Breath sounds: Normal breath sounds.  Abdominal:     General: Bowel sounds are normal.     Palpations: Abdomen is soft. There is no hepatomegaly, splenomegaly or mass.     Tenderness: There is no abdominal tenderness. There is no guarding or rebound.     Hernia: No hernia is present.  Skin:    General: Skin is warm and dry.     Coloration: Skin is not jaundiced.     Findings: No rash.  Neurological:     General: No focal deficit present.     Mental Status: She is alert and oriented to person, place, and time.  Psychiatric:        Attention and Perception: Attention normal.        Mood and Affect: Mood normal.        Speech: Speech normal.        Behavior: Behavior normal.        Thought Content: Thought content normal.        Cognition and Memory: Cognition  and memory normal.      Assessment:  Pleasant 64 year old female who presents for follow-up on diarrhea and weight loss as well as nausea and vomiting.  Overall she is doing significantly better.  Still some intermittent diarrhea  but overall well improved on current regimen.  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  She remains on Creon and is having better bowel movements on this.  She attributes her improved stools to eating better, more frequently.  She has occasional diarrhea depending on what she eats (such as lettuce or, last night, she had stool which caused significant diarrhea).  Recent colonoscopy and stool studies are reassuring.  At this point I recommend she takes her medications as ordered.  I will have her add a probiotic for 1 to 2 months.  Nausea/vomiting: With improvement in diarrhea her nausea and vomiting has resolved  Weight loss: Likely due to insufficient intake.  Her weight is improved by about 4-5 pounds since we last saw her.  Recommend she continue supplements with Ensure or the like, continue improved oral intake.  Recent colonoscopy is reassuring.   Plan: 1. Continue current regimen including Creon with meals/snacks, Bentyl as needed, Imodium as needed 2. Call for worsening loose stools 3. Add probiotic for 1 to 2 months 4. Continue other medications 5. Follow-up in 3 months    Thank you for allowing [place] to participate in the care of [place], DNP, AGNP-C Adult & Gerontological Nurse Practitioner [facility]   08/05/2020 11:21 AM   Disclaimer: This note was dictated with voice recognition software. Similar sounding words can inadvertently be transcribed and may not be corrected upon review.

## 2020-08-06 NOTE — Progress Notes (Signed)
Cc'ed to pcp °

## 2020-08-13 ENCOUNTER — Inpatient Hospital Stay (HOSPITAL_COMMUNITY): Payer: 59

## 2020-08-13 ENCOUNTER — Inpatient Hospital Stay (HOSPITAL_COMMUNITY): Payer: 59 | Attending: Hematology | Admitting: Hematology

## 2020-08-13 ENCOUNTER — Other Ambulatory Visit: Payer: Self-pay

## 2020-08-13 VITALS — BP 135/82 | HR 81 | Temp 96.9°F | Resp 16 | Wt 100.0 lb

## 2020-08-13 DIAGNOSIS — R803 Bence Jones proteinuria: Secondary | ICD-10-CM | POA: Insufficient documentation

## 2020-08-13 DIAGNOSIS — Z87891 Personal history of nicotine dependence: Secondary | ICD-10-CM | POA: Insufficient documentation

## 2020-08-13 DIAGNOSIS — D472 Monoclonal gammopathy: Secondary | ICD-10-CM

## 2020-08-13 DIAGNOSIS — R197 Diarrhea, unspecified: Secondary | ICD-10-CM | POA: Diagnosis not present

## 2020-08-13 DIAGNOSIS — E876 Hypokalemia: Secondary | ICD-10-CM | POA: Insufficient documentation

## 2020-08-13 DIAGNOSIS — Z79899 Other long term (current) drug therapy: Secondary | ICD-10-CM | POA: Diagnosis not present

## 2020-08-13 DIAGNOSIS — Z803 Family history of malignant neoplasm of breast: Secondary | ICD-10-CM | POA: Insufficient documentation

## 2020-08-13 DIAGNOSIS — D649 Anemia, unspecified: Secondary | ICD-10-CM | POA: Diagnosis not present

## 2020-08-13 DIAGNOSIS — F329 Major depressive disorder, single episode, unspecified: Secondary | ICD-10-CM | POA: Diagnosis not present

## 2020-08-13 DIAGNOSIS — M069 Rheumatoid arthritis, unspecified: Secondary | ICD-10-CM | POA: Insufficient documentation

## 2020-08-13 DIAGNOSIS — R634 Abnormal weight loss: Secondary | ICD-10-CM | POA: Insufficient documentation

## 2020-08-13 DIAGNOSIS — E860 Dehydration: Secondary | ICD-10-CM | POA: Insufficient documentation

## 2020-08-13 LAB — COMPREHENSIVE METABOLIC PANEL
ALT: 9 U/L (ref 0–44)
AST: 12 U/L — ABNORMAL LOW (ref 15–41)
Albumin: 2.5 g/dL — ABNORMAL LOW (ref 3.5–5.0)
Alkaline Phosphatase: 109 U/L (ref 38–126)
Anion gap: 9 (ref 5–15)
BUN: 10 mg/dL (ref 8–23)
CO2: 21 mmol/L — ABNORMAL LOW (ref 22–32)
Calcium: 7.5 mg/dL — ABNORMAL LOW (ref 8.9–10.3)
Chloride: 108 mmol/L (ref 98–111)
Creatinine, Ser: 0.83 mg/dL (ref 0.44–1.00)
GFR, Estimated: >60 mL/min (ref >60)
Glucose, Bld: 137 mg/dL — ABNORMAL HIGH (ref 70–99)
Potassium: 2.4 mmol/L — CL (ref 3.5–5.1)
Sodium: 138 mmol/L (ref 135–145)
Total Bilirubin: 0.4 mg/dL (ref 0.3–1.2)
Total Protein: 5.9 g/dL — ABNORMAL LOW (ref 6.5–8.1)

## 2020-08-13 LAB — CBC WITH DIFFERENTIAL/PLATELET
Abs Immature Granulocytes: 0.02 10*3/uL (ref 0.00–0.07)
Basophils Absolute: 0 10*3/uL (ref 0.0–0.1)
Basophils Relative: 0 %
Eosinophils Absolute: 0.2 10*3/uL (ref 0.0–0.5)
Eosinophils Relative: 3 %
HCT: 23.8 % — ABNORMAL LOW (ref 36.0–46.0)
Hemoglobin: 7.4 g/dL — ABNORMAL LOW (ref 12.0–15.0)
Immature Granulocytes: 0 %
Lymphocytes Relative: 28 %
Lymphs Abs: 1.7 10*3/uL (ref 0.7–4.0)
MCH: 30.5 pg (ref 26.0–34.0)
MCHC: 31.1 g/dL (ref 30.0–36.0)
MCV: 97.9 fL (ref 80.0–100.0)
Monocytes Absolute: 0.6 10*3/uL (ref 0.1–1.0)
Monocytes Relative: 11 %
Neutro Abs: 3.5 10*3/uL (ref 1.7–7.7)
Neutrophils Relative %: 58 %
Platelets: 381 10*3/uL (ref 150–400)
RBC: 2.43 MIL/uL — ABNORMAL LOW (ref 3.87–5.11)
RDW: 16.2 % — ABNORMAL HIGH (ref 11.5–15.5)
WBC: 6 10*3/uL (ref 4.0–10.5)
nRBC: 0 % (ref 0.0–0.2)

## 2020-08-13 LAB — LACTATE DEHYDROGENASE: LDH: 178 U/L (ref 98–192)

## 2020-08-13 MED ORDER — POTASSIUM CHLORIDE CRYS ER 20 MEQ PO TBCR
60.0000 meq | EXTENDED_RELEASE_TABLET | Freq: Once | ORAL | Status: AC
  Administered 2020-08-13: 60 meq via ORAL

## 2020-08-13 MED ORDER — POTASSIUM CHLORIDE CRYS ER 20 MEQ PO TBCR
EXTENDED_RELEASE_TABLET | ORAL | Status: AC
  Filled 2020-08-13: qty 3

## 2020-08-13 MED ORDER — POTASSIUM CHLORIDE CRYS ER 20 MEQ PO TBCR: 100.0000 meq | EXTENDED_RELEASE_TABLET | Freq: Once | ORAL | Status: AC

## 2020-08-13 NOTE — Progress Notes (Signed)
Erica Beasley, [place] [id number]   CLINIC:  Medical Oncology/Hematology  PCP:  Loman [place], [place] / [place] [address]  [phone]  REASON FOR VISIT:  Follow-up for monoclonal gammopathy & normocytic anemia  PRIOR THERAPY: None  CURRENT THERAPY: Observation  INTERVAL HISTORY:  Erica Beasley, a 64 y.o. female, returns for routine follow-up for her monoclonal gammopathy and normocytic anemia. Adelina was last seen on 07/14/2020.  Today she reports feeling okay. She reports having occasional melena but denies having hematochezia. She started having diarrhea on 11/10 after not having it for nearly a month; she reports having 10 episodes of watery diarrhea at its worse and reports that it has slowed down slightly over the past 3 days. She took Imodium but it did not stop the diarrhea.    REVIEW OF SYSTEMS:  Review of Systems  Constitutional: Positive for appetite change (50%) and fatigue (50%).  Cardiovascular: Positive for chest pain (occasional).  Gastrointestinal: Positive for diarrhea (since 11/10). Negative for blood in stool.  Neurological: Positive for numbness (hands).  All other systems reviewed and are negative.   PAST MEDICAL/SURGICAL HISTORY:  Past Medical History:  Diagnosis Date  . COPD (chronic obstructive pulmonary disease) ([place])   . COVID-19 06/2019  . Depression   . Diabetes ([place])   . Hyperlipidemia   . Hypothyroidism   . Neuropathy   . Rheumatoid arthritis [facility])    Past Surgical History:  Procedure Laterality Date  . BIOPSY  09/16/2019   Procedure: BIOPSY;  Surgeon: Daneil Dolin, MD;  [facility];  Service: Endoscopy;;  . BIOPSY  04/28/2020   Procedure: BIOPSY;  Surgeon: Daneil Dolin, MD;  [facility];  Service: Endoscopy;;  . BIOPSY  06/08/2020   Procedure: BIOPSY;  Surgeon: Daneil Dolin, MD;  [facility];  Service: Endoscopy;;  . CATARACT  EXTRACTION Bilateral   . COLONOSCOPY  09/2010   Dr. Posey Pronto: mild diverticulsis in sigmoid colon.   . COLONOSCOPY WITH PROPOFOL N/A 09/16/2019   Rourk: Diverticulosis, random colon biopsies negative for microscopic colitis.  . COLONOSCOPY WITH PROPOFOL N/A 06/08/2020   Procedure: COLONOSCOPY WITH PROPOFOL;  Surgeon: Daneil Dolin, MD;  [facility];  Service: Endoscopy;  Laterality: N/A;  9:15am  . ESOPHAGOGASTRODUODENOSCOPY (EGD) WITH PROPOFOL N/A 04/28/2020   Procedure: ESOPHAGOGASTRODUODENOSCOPY (EGD) WITH PROPOFOL;  Surgeon: Daneil Dolin, MD;  [facility];  Service: Endoscopy;  Laterality: N/A;  2:15  . YAG LASER APPLICATION Left 09/62/8366   Procedure: YAG LASER APPLICATION;  Surgeon: Williams Che, MD;  Location: [facility];  Service: Ophthalmology;  Laterality: Left;    SOCIAL HISTORY:  Social History   Socioeconomic History  . Marital status: Widowed    Spouse name: Not on file  . Number of children: 1  . Years of education: Not on file  . Highest education level: Not on file  Occupational History  . Occupation: DISABLED  Tobacco Use  . Smoking status: Former Smoker    Packs/day: 1.00    Years: 15.00    Pack years: 15.00    Types: Cigarettes    Quit date: 09/11/2017    Years since quitting: 2.9  . Smokeless tobacco: Former Network engineer  . Vaping Use: Never used  Substance and Sexual Activity  . Alcohol use: Never  . Drug use: Never  . Sexual activity: Not Currently  Other  Topics Concern  . Not on file  Social History Narrative  . Not on file   Social Determinants of Health   Financial Resource Strain:   . Difficulty of Paying Living Expenses: Not on file  Food Insecurity:   . Worried About Charity fundraiser in the Last Year: Not on file  . Ran Out of Food in the Last Year: Not on file  Transportation Needs:   . Lack of Transportation (Medical): Not on file  . Lack of Transportation (Non-Medical): Not on file  Physical Activity:    . Days of Exercise per Week: Not on file  . Minutes of Exercise per Session: Not on file  Stress:   . Feeling of Stress : Not on file  Social Connections:   . Frequency of Communication with Friends and Family: Not on file  . Frequency of Social Gatherings with Friends and Family: Not on file  . Attends Religious Services: Not on file  . Active Member of Clubs or Organizations: Not on file  . Attends Archivist Meetings: Not on file  . Marital Status: Not on file  Intimate Partner Violence:   . Fear of Current or Ex-Partner: Not on file  . Emotionally Abused: Not on file  . Physically Abused: Not on file  . Sexually Abused: Not on file    FAMILY HISTORY:  Family History  Problem Relation Age of Onset  . Stroke Mother   . Heart disease Mother   . Diabetes Mother   . Breast cancer Mother   . Other Father        stomach taken out for some reason  . Migraines Daughter   . Colon cancer Neg Hx     CURRENT MEDICATIONS:  Current Outpatient Medications  Medication Sig Dispense Refill  . Cholecalciferol (VITAMIN D3) 125 MCG (5000 UT) TABS Take 1 tablet (5,000 Units total) by mouth daily. 30 tablet 3  . citalopram (CELEXA) 40 MG tablet Take 0.5 tablets (20 mg total) by mouth daily. 30 tablet 6  . dicyclomine (BENTYL) 10 MG capsule Take 1 capsule (10 mg total) by mouth 2 (two) times daily as needed for spasms. 60 capsule 3  . ferrous sulfate 325 (65 FE) MG tablet Take 325 mg by mouth daily with breakfast.    . gabapentin (NEURONTIN) 300 MG capsule Take 1 capsule (300 mg total) by mouth 3 (three) times daily. (Patient taking differently: Take 300-600 mg by mouth See admin instructions. Take 300 mg in the morning and 600 mg at night) 90 capsule 3  . hydroxychloroquine (PLAQUENIL) 200 MG tablet Take 200 mg by mouth daily.     Marland Kitchen leflunomide (ARAVA) 20 MG tablet Take 20 mg by mouth daily.    Marland Kitchen levothyroxine (SYNTHROID) 100 MCG tablet Take 100 mcg by mouth every morning.    .  loperamide (IMODIUM A-D) 2 MG tablet Take 1 tablet (2 mg total) by mouth in the morning. Take second dose if needed (Patient taking differently: Take 2 mg by mouth as needed. Take 2 mg in the morning and may take a second 2 mg dose as needed for diarrhea) 30 tablet 3  . megestrol (MEGACE) 400 MG/10ML suspension Take 10 mLs (400 mg total) by mouth 2 (two) times daily. 480 mL 1  . Pancrelipase, Lip-Prot-Amyl, (CREON) 24000-76000 units CPEP Take 48,000 units (2 capsules) with meals and 24000 units (1 capsule) with snacks (Patient taking differently: Take 24,000-48,000 Units by mouth See admin instructions. Take 48,000 units (  2 capsules) with meals and 24000 units (1 capsule) with snacks) 240 capsule 2  . pantoprazole (PROTONIX) 40 MG tablet TAKE ONE TABLET BY MOUTH DAILY BEFORE BREAKFAST 90 tablet 1  . pravastatin (PRAVACHOL) 40 MG tablet Take 1 tablet (40 mg total) by mouth at bedtime. 30 tablet 3  . traZODone (DESYREL) 100 MG tablet     . ondansetron (ZOFRAN ODT) 4 MG disintegrating tablet Take 1 tablet (4 mg total) by mouth every 6 (six) hours. (Patient not taking: Reported on 08/13/2020) 120 tablet 1   Current Facility-Administered Medications  Medication Dose Route Frequency Provider Last Rate Last Admin  . potassium chloride SA (KLOR-CON) CR tablet 100 mEq  100 mEq Oral Once Derek Jack, MD        ALLERGIES:  No Known Allergies  PHYSICAL EXAM:  Performance status (ECOG): 1 - Symptomatic but completely ambulatory  Vitals:   08/13/20 1342  BP: 135/82  Pulse: 81  Resp: 16  Temp: (!) 96.9 F (36.1 C)  SpO2: 96%   Wt Readings from Last 3 Encounters:  08/13/20 100 lb (45.4 kg)  08/05/20 103 lb (46.7 kg)  07/14/20 97 lb 9.6 oz (44.3 kg)   Physical Exam Vitals reviewed.  Constitutional:      Appearance: Normal appearance.  Cardiovascular:     Rate and Rhythm: Normal rate and regular rhythm.     Pulses: Normal pulses.     Heart sounds: Normal heart sounds.  Pulmonary:      Effort: Pulmonary effort is normal.     Breath sounds: Normal breath sounds.  Abdominal:     Palpations: Abdomen is soft. There is no mass.     Tenderness: There is no abdominal tenderness.  Musculoskeletal:     Right lower leg: No edema.     Left lower leg: No edema.  Neurological:     General: No focal deficit present.     Mental Status: She is alert and oriented to person, place, and time.  Psychiatric:        Mood and Affect: Mood normal.        Behavior: Behavior normal.     LABORATORY DATA:  I have reviewed the labs as listed.  CBC Latest Ref Rng & Units 08/13/2020 06/25/2020 06/04/2020  WBC 4.0 - 10.5 K/uL 6.0 5.1 4.7  Hemoglobin 12.0 - 15.0 g/dL 7.4(L) 9.0(L) 8.6(L)  Hematocrit 36 - 46 % 23.8(L) 29.2(L) 29.5(L)  Platelets 150 - 400 K/uL 381 291 356   CMP Latest Ref Rng & Units 08/13/2020 06/25/2020 06/04/2020  Glucose 70 - 99 mg/dL 137(H) 85 175(H)  BUN 8 - 23 mg/dL 10 5(L) 6(L)  Creatinine 0.44 - 1.00 mg/dL 0.83 0.70 0.67  Sodium 135 - 145 mmol/L 138 135 139  Potassium 3.5 - 5.1 mmol/L 2.4(LL) 3.0(L) 3.8  Chloride 98 - 111 mmol/L 108 99 103  CO2 22 - 32 mmol/L 21(L) 26 25  Calcium 8.9 - 10.3 mg/dL 7.5(L) 7.5(L) 8.0(L)  Total Protein 6.5 - 8.1 g/dL 5.9(L) 6.0(L) 5.4(L)  Total Bilirubin 0.3 - 1.2 mg/dL 0.4 0.3 0.4  Alkaline Phos 38 - 126 U/L 109 168(H) 134(H)  AST 15 - 41 U/L 12(L) 21 21  ALT 0 - 44 U/L 9 10 10       Component Value Date/Time   RBC 2.43 (L) 08/13/2020 1312   MCV 97.9 08/13/2020 1312   MCV 85 05/07/2020 1246   MCH 30.5 08/13/2020 1312   MCHC 31.1 08/13/2020 1312   RDW 16.2 (H)  08/13/2020 1312   RDW 13.4 05/07/2020 1246   LYMPHSABS 1.7 08/13/2020 1312   LYMPHSABS 1.8 05/07/2020 1246   MONOABS 0.6 08/13/2020 1312   EOSABS 0.2 08/13/2020 1312   EOSABS 0.1 05/07/2020 1246   BASOSABS 0.0 08/13/2020 1312   BASOSABS 0.0 05/07/2020 1246   Lab Results  Component Value Date   LDH 178 08/13/2020   LDH 186 04/03/2020    DIAGNOSTIC IMAGING:  I have  independently reviewed the scans and discussed with the patient. MM 3D SCREEN BREAST BILATERAL  Result Date: 07/23/2020 CLINICAL DATA:  Screening. EXAM: DIGITAL SCREENING BILATERAL MAMMOGRAM WITH TOMO AND CAD COMPARISON:  Previous exam(s). ACR Breast Density Category b: There are scattered areas of fibroglandular density. FINDINGS: There are no findings suspicious for malignancy. Images were processed with CAD. IMPRESSION: No mammographic evidence of malignancy. A result letter of this screening mammogram will be mailed directly to the patient. RECOMMENDATION: Screening mammogram in one year. (Code:SM-B-01Y) BI-RADS CATEGORY  1: Negative. Electronically Signed   By: Lajean Manes M.D.   On: 07/23/2020 15:33     ASSESSMENT:  1. Bence-Jones proteinuria: -24-hour urine done by Dr. Theador Hawthorne showed urine immunofixation positive for Bence-Jones proteinuria, kappa type. 24-hour total protein was 77 mg/day. -Free kappa light chains were 52.9, lambda light chains 29.8 with ratio of 1.78. Serum immunofixation was unremarkable. SPEP did not show any evidence of M spike. -Labs on 04/03/2020 shows negative immunofixation. Kappa light chain 61.3, lambda light chains 32.3, ratio 1.9. -Skeletal survey on 04/03/2020 was negative for lytic lesions.  2. Weight loss: -25 pound weight loss in the last 2 months. -She smoked cigarettes 1 pack/day for 46 years, and quit 3 years ago. -She reports that she cannot eat due to decreased appetite. Also feels very nauseous on smelling of foods. -EGD on 04/28/2020 with stomach and duodenal biopsy negative. -CT CAP on 05/11/2020 showed long segment colonic thickening likely diverticular disease. Stable small juxtapleural nodule in the right upper chest benign over 1 year. Moderate large fat-containing left inguinal hernia. Emphysema and diverticular calcification. -Colonoscopy on 06/08/2020 shows pancolonic diverticulosis, mild. Normal-appearing colonic mucosa otherwise.  Random biopsies were negative.  3. Normocytic anemia: -CBC on 03/20/2020 shows hemoglobin 9.9 with MCV of 93. White count and platelets were normal. -Colonoscopy on 09/16/2019 shows diverticulosis in the entire examined colon. Nonbleeding internal hemorrhoids.  4. Rheumatoid arthritis: -She is on Plaquenil and Areva for the last 4 years.   PLAN:  1. Bence-Jones proteinuria: -Creatinine is 0.8.  Calcium is 7.5. -We will plan to repeat myeloma panel in January.  2. Weight loss: -She gained 3 pounds in the last 1 month. -Continue Megace 400 mg twice daily. -She is having more diarrhea.  She is being evaluated by GI. -She is on pancrelipase 2 capsules with meals and 1 capsule with snack.  3. Normocytic anemia: -Labs consistent with anemia from chronic inflammation. -However hemoglobin dropped from 9-7.4.  LDH is normal. -He denies any bleeding per rectum or melena.  4. Depression: -Continue Celexa 40 mg daily.  5.  Hypokalemia: -Potassium today is 2.4.  We have given oral potassium 40 mEq in the office today. -Repeat potassium was 3.2.  We have given potassium 20 mEq daily for 7 days.  Orders placed this encounter:  Orders Placed This Encounter  Procedures  . Potassium     Derek Jack, MD [place] [phone]   I, Milinda Antis, am acting as a scribe for Dr. Sanda Linger.  Kinnie Scales  MD, have reviewed the above documentation for accuracy and completeness, and I agree with the above.

## 2020-08-13 NOTE — Patient Instructions (Signed)
[place] at [facility] Discharge Instructions  You were seen today by Dr. Delton Coombes. He went over your recent results. You will be given potassium supplements to improve your potassium levels; you will also receive fluids tomorrow. Take 2 tablets of Imodium with every watery bowel movement. Dr. Delton Coombes will see you back in 3 weeks for labs and follow up.   Thank you for choosing [place] at [facility] to provide your oncology and hematology care.  To afford each patient quality time with our provider, please arrive at least 15 minutes before your scheduled appointment time.   If you have a lab appointment with the [facility] please come in thru the Main Entrance and check in at the main information desk  You need to re-schedule your appointment should you arrive 10 or more minutes late.  We strive to give you quality time with our providers, and arriving late affects you and other patients whose appointments are after yours.  Also, if you no show three or more times for appointments you may be dismissed from the clinic at the providers discretion.     Again, thank you for choosing [facility].  Our hope is that these requests will decrease the amount of time that you wait before being seen by our physicians.       _____________________________________________________________  Should you have questions after your visit to [facility], please contact our office at [phone] between the hours of 8:00 a.m. and 4:30 p.m.  Voicemails left after 4:00 p.m. will not be returned until the following business day.  For prescription refill requests, have your pharmacy contact our office and allow 72 hours.    Cancer Center Support Programs:   > Cancer Support Group  2nd Tuesday of the month 1pm-2pm, Journey Room

## 2020-08-13 NOTE — Progress Notes (Signed)
Patient here for office visit today. Potassium 2.4.  Orders received for potassium 60 mEq by mouth times once today.  Recheck potassium tomorrow.

## 2020-08-14 ENCOUNTER — Ambulatory Visit (HOSPITAL_COMMUNITY): Payer: 59

## 2020-08-14 ENCOUNTER — Other Ambulatory Visit (HOSPITAL_COMMUNITY): Payer: 59

## 2020-08-14 ENCOUNTER — Inpatient Hospital Stay (HOSPITAL_COMMUNITY): Payer: 59

## 2020-08-14 ENCOUNTER — Other Ambulatory Visit: Payer: Self-pay

## 2020-08-14 DIAGNOSIS — E876 Hypokalemia: Secondary | ICD-10-CM

## 2020-08-14 DIAGNOSIS — E860 Dehydration: Secondary | ICD-10-CM

## 2020-08-14 DIAGNOSIS — R803 Bence Jones proteinuria: Secondary | ICD-10-CM | POA: Diagnosis not present

## 2020-08-14 LAB — POTASSIUM: Potassium: 3.2 mmol/L — ABNORMAL LOW (ref 3.5–5.1)

## 2020-08-14 MED ORDER — POTASSIUM CHLORIDE CRYS ER 20 MEQ PO TBCR: 20.0000 meq | EXTENDED_RELEASE_TABLET | Freq: Every day | ORAL | 0 refills | Status: DC

## 2020-08-14 MED ORDER — POTASSIUM CHLORIDE CRYS ER 20 MEQ PO TBCR
40.0000 meq | EXTENDED_RELEASE_TABLET | Freq: Once | ORAL | Status: AC
  Administered 2020-08-14: 40 meq via ORAL
  Filled 2020-08-14: qty 2

## 2020-08-14 NOTE — Progress Notes (Signed)
Potassium rechecked today and it was up to 3.2.  Hold IV fluids and potassium per Faythe Casa, NP.  Order for Potassium 40 mEq PO today and a prescription sent to patient's pharmacy for Potassium 20 mEq daily x one week.  VSS and patient stable upon discharge.

## 2020-08-16 ENCOUNTER — Other Ambulatory Visit (HOSPITAL_COMMUNITY): Payer: Self-pay | Admitting: Hematology

## 2020-09-03 ENCOUNTER — Inpatient Hospital Stay (HOSPITAL_BASED_OUTPATIENT_CLINIC_OR_DEPARTMENT_OTHER): Payer: 59 | Admitting: Oncology

## 2020-09-03 ENCOUNTER — Encounter (HOSPITAL_COMMUNITY): Payer: Self-pay | Admitting: Oncology

## 2020-09-03 ENCOUNTER — Other Ambulatory Visit: Payer: Self-pay

## 2020-09-03 ENCOUNTER — Inpatient Hospital Stay (HOSPITAL_COMMUNITY): Payer: 59 | Attending: Hematology and Oncology

## 2020-09-03 VITALS — BP 170/83 | HR 70 | Temp 98.0°F | Resp 18 | Wt 106.9 lb

## 2020-09-03 DIAGNOSIS — R2242 Localized swelling, mass and lump, left lower limb: Secondary | ICD-10-CM

## 2020-09-03 DIAGNOSIS — D649 Anemia, unspecified: Secondary | ICD-10-CM | POA: Insufficient documentation

## 2020-09-03 DIAGNOSIS — R634 Abnormal weight loss: Secondary | ICD-10-CM | POA: Insufficient documentation

## 2020-09-03 DIAGNOSIS — M069 Rheumatoid arthritis, unspecified: Secondary | ICD-10-CM | POA: Diagnosis not present

## 2020-09-03 DIAGNOSIS — Z79899 Other long term (current) drug therapy: Secondary | ICD-10-CM | POA: Insufficient documentation

## 2020-09-03 DIAGNOSIS — Z8616 Personal history of COVID-19: Secondary | ICD-10-CM | POA: Diagnosis not present

## 2020-09-03 DIAGNOSIS — R803 Bence Jones proteinuria: Secondary | ICD-10-CM | POA: Diagnosis not present

## 2020-09-03 DIAGNOSIS — D472 Monoclonal gammopathy: Secondary | ICD-10-CM

## 2020-09-03 DIAGNOSIS — E876 Hypokalemia: Secondary | ICD-10-CM | POA: Diagnosis not present

## 2020-09-03 DIAGNOSIS — F329 Major depressive disorder, single episode, unspecified: Secondary | ICD-10-CM | POA: Insufficient documentation

## 2020-09-03 DIAGNOSIS — M7989 Other specified soft tissue disorders: Secondary | ICD-10-CM | POA: Insufficient documentation

## 2020-09-03 DIAGNOSIS — Z87891 Personal history of nicotine dependence: Secondary | ICD-10-CM | POA: Insufficient documentation

## 2020-09-03 LAB — CBC WITH DIFFERENTIAL/PLATELET
Abs Immature Granulocytes: 0.01 10*3/uL (ref 0.00–0.07)
Basophils Absolute: 0 10*3/uL (ref 0.0–0.1)
Basophils Relative: 0 %
Eosinophils Absolute: 0.1 10*3/uL (ref 0.0–0.5)
Eosinophils Relative: 3 %
HCT: 25.8 % — ABNORMAL LOW (ref 36.0–46.0)
Hemoglobin: 7.7 g/dL — ABNORMAL LOW (ref 12.0–15.0)
Immature Granulocytes: 0 %
Lymphocytes Relative: 28 %
Lymphs Abs: 1.3 10*3/uL (ref 0.7–4.0)
MCH: 30.9 pg (ref 26.0–34.0)
MCHC: 29.8 g/dL — ABNORMAL LOW (ref 30.0–36.0)
MCV: 103.6 fL — ABNORMAL HIGH (ref 80.0–100.0)
Monocytes Absolute: 0.4 10*3/uL (ref 0.1–1.0)
Monocytes Relative: 8 %
Neutro Abs: 2.9 10*3/uL (ref 1.7–7.7)
Neutrophils Relative %: 61 %
Platelets: 332 10*3/uL (ref 150–400)
RBC: 2.49 MIL/uL — ABNORMAL LOW (ref 3.87–5.11)
RDW: 15.5 % (ref 11.5–15.5)
WBC: 4.7 10*3/uL (ref 4.0–10.5)
nRBC: 0 % (ref 0.0–0.2)

## 2020-09-03 LAB — COMPREHENSIVE METABOLIC PANEL
ALT: 14 U/L (ref 0–44)
AST: 29 U/L (ref 15–41)
Albumin: 3 g/dL — ABNORMAL LOW (ref 3.5–5.0)
Alkaline Phosphatase: 95 U/L (ref 38–126)
Anion gap: 8 (ref 5–15)
BUN: 8 mg/dL (ref 8–23)
CO2: 28 mmol/L (ref 22–32)
Calcium: 8 mg/dL — ABNORMAL LOW (ref 8.9–10.3)
Chloride: 104 mmol/L (ref 98–111)
Creatinine, Ser: 0.8 mg/dL (ref 0.44–1.00)
GFR, Estimated: >60 mL/min (ref >60)
Glucose, Bld: 96 mg/dL (ref 70–99)
Potassium: 2.5 mmol/L — CL (ref 3.5–5.1)
Sodium: 140 mmol/L (ref 135–145)
Total Bilirubin: 0.4 mg/dL (ref 0.3–1.2)
Total Protein: 5.9 g/dL — ABNORMAL LOW (ref 6.5–8.1)

## 2020-09-03 LAB — LACTATE DEHYDROGENASE: LDH: 199 U/L — ABNORMAL HIGH (ref 98–192)

## 2020-09-03 MED ORDER — POTASSIUM CHLORIDE CRYS ER 20 MEQ PO TBCR: 20.0000 meq | EXTENDED_RELEASE_TABLET | Freq: Two times a day (BID) | ORAL | 0 refills | Status: DC

## 2020-09-03 MED ORDER — POTASSIUM CHLORIDE CRYS ER 20 MEQ PO TBCR: EXTENDED_RELEASE_TABLET | ORAL | 0 refills | Status: DC

## 2020-09-03 MED ORDER — POTASSIUM CHLORIDE CRYS ER 20 MEQ PO TBCR: 20.0000 meq | EXTENDED_RELEASE_TABLET | Freq: Every day | ORAL | 0 refills | Status: DC

## 2020-09-03 NOTE — Progress Notes (Signed)
Erica Beasley, [place] [id number]   CLINIC:  Medical Oncology/Hematology  PCP:  Loman [place], [place] / [place] [id number]  [phone]  REASON FOR VISIT:  Follow-up for monoclonal gammopathy & normocytic anemia  PRIOR THERAPY: None  CURRENT THERAPY: Observation  INTERVAL HISTORY:  [name], a 64 y.o. female, returns for routine follow-up for her monoclonal gammopathy and normocytic anemia. Kerby was last seen on 08/13/20.  Her potassium level was found to be significantly low and she was given IV potassium and placed on oral potassium for a week.  Patient admits to being compliant.  In the interim, her diarrhea has improved.  She takes Imodium as needed.  Endorses feeling tired.  Her appetite has improved and she has gained a few pounds.  She admits to swelling of her left lower ankle and her blood pressure has been running high.  She denies any shortness of breath or chest pain.  She is taking all of her medications as prescribed by her PCP and has made an appointment for the end of this month.  She has swelling in her left lower extremity.  REVIEW OF SYSTEMS:  Review of Systems  Constitutional: Positive for appetite change (50%) and fatigue (50%).  Cardiovascular: Negative for chest pain (occasional).  Gastrointestinal: Positive for diarrhea (since 11/10). Negative for blood in stool.  Neurological: Positive for numbness (hands).  All other systems reviewed and are negative.   PAST MEDICAL/SURGICAL HISTORY:  Past Medical History:  Diagnosis Date  . COPD (chronic obstructive pulmonary disease) ([place])   . COVID-19 06/2019  . Depression   . Diabetes ([place])   . Hyperlipidemia   . Hypothyroidism   . Neuropathy   . Rheumatoid arthritis [facility])    Past Surgical History:  Procedure Laterality Date  . BIOPSY  09/16/2019   Procedure: BIOPSY;  Surgeon: Daneil Dolin, MD;  Location: [facility];  Service: Endoscopy;;   . BIOPSY  04/28/2020   Procedure: BIOPSY;  Surgeon: Daneil Dolin, MD;  [facility];  Service: Endoscopy;;  . BIOPSY  06/08/2020   Procedure: BIOPSY;  Surgeon: Daneil Dolin, MD;  [facility];  Service: Endoscopy;;  . CATARACT EXTRACTION Bilateral   . COLONOSCOPY  09/2010   Dr. Posey Pronto: mild diverticulsis in sigmoid colon.   . COLONOSCOPY WITH PROPOFOL N/A 09/16/2019   Rourk: Diverticulosis, random colon biopsies negative for microscopic colitis.  . COLONOSCOPY WITH PROPOFOL N/A 06/08/2020   Procedure: COLONOSCOPY WITH PROPOFOL;  Surgeon: Daneil Dolin, MD;  [facility];  Service: Endoscopy;  Laterality: N/A;  9:15am  . ESOPHAGOGASTRODUODENOSCOPY (EGD) WITH PROPOFOL N/A 04/28/2020   Procedure: ESOPHAGOGASTRODUODENOSCOPY (EGD) WITH PROPOFOL;  Surgeon: Daneil Dolin, MD;  [facility];  Service: Endoscopy;  Laterality: N/A;  2:15  . YAG LASER APPLICATION Left 90/30/0923   Procedure: YAG LASER APPLICATION;  Surgeon: Williams Che, MD;  Location: [facility];  Service: Ophthalmology;  Laterality: Left;    SOCIAL HISTORY:  Social History   Socioeconomic History  . Marital status: Widowed    Spouse name: Not on file  . Number of children: 1  . Years of education: Not on file  . Highest education level: Not on file  Occupational History  . Occupation: DISABLED  Tobacco Use  . Smoking status: Former Smoker    Packs/day: 1.00    Years: 15.00    Pack years: 15.00  Types: Cigarettes    Quit date: 09/11/2017    Years since quitting: 2.9  . Smokeless tobacco: Former Network engineer  . Vaping Use: Never used  Substance and Sexual Activity  . Alcohol use: Never  . Drug use: Never  . Sexual activity: Not Currently  Other Topics Concern  . Not on file  Social History Narrative  . Not on file   Social Determinants of Health   Financial Resource Strain: Low Risk   . Difficulty of Paying Living Expenses: Not hard at all  Food  Insecurity: No Food Insecurity  . Worried About Charity fundraiser in the Last Year: Never true  . Ran Out of Food in the Last Year: Never true  Transportation Needs: No Transportation Needs  . Lack of Transportation (Medical): No  . Lack of Transportation (Non-Medical): No  Physical Activity: Insufficiently Active  . Days of Exercise per Week: 3 days  . Minutes of Exercise per Session: 30 min  Stress: No Stress Concern Present  . Feeling of Stress : Only a little  Social Connections: Socially Isolated  . Frequency of Communication with Friends and Family: More than three times a week  . Frequency of Social Gatherings with Friends and Family: More than three times a week  . Attends Religious Services: Never  . Active Member of Clubs or Organizations: No  . Attends Archivist Meetings: Never  . Marital Status: Widowed  Intimate Partner Violence: Not At Risk  . Fear of Current or Ex-Partner: No  . Emotionally Abused: No  . Physically Abused: No  . Sexually Abused: No    FAMILY HISTORY:  Family History  Problem Relation Age of Onset  . Stroke Mother   . Heart disease Mother   . Diabetes Mother   . Breast cancer Mother   . Other Father        stomach taken out for some reason  . Migraines Daughter   . Colon cancer Neg Hx     CURRENT MEDICATIONS:  Current Outpatient Medications  Medication Sig Dispense Refill  . Cholecalciferol (VITAMIN D3) 125 MCG (5000 UT) TABS Take 1 tablet (5,000 Units total) by mouth daily. 30 tablet 3  . citalopram (CELEXA) 40 MG tablet Take 0.5 tablets (20 mg total) by mouth daily. 30 tablet 6  . dicyclomine (BENTYL) 10 MG capsule Take 1 capsule (10 mg total) by mouth 2 (two) times daily as needed for spasms. 60 capsule 3  . ferrous sulfate 325 (65 FE) MG tablet Take 325 mg by mouth daily with breakfast.    . gabapentin (NEURONTIN) 300 MG capsule Take 1 capsule (300 mg total) by mouth 3 (three) times daily. (Patient taking differently: Take  300-600 mg by mouth See admin instructions. Take 300 mg in the morning and 600 mg at night) 90 capsule 3  . hydroxychloroquine (PLAQUENIL) 200 MG tablet Take 200 mg by mouth daily.     Marland Kitchen leflunomide (ARAVA) 20 MG tablet Take 20 mg by mouth daily.    Marland Kitchen levothyroxine (SYNTHROID) 100 MCG tablet Take 100 mcg by mouth every morning.    . loperamide (IMODIUM A-D) 2 MG tablet Take 1 tablet (2 mg total) by mouth in the morning. Take second dose if needed (Patient taking differently: Take 2 mg by mouth as needed. Take 2 mg in the morning and may take a second 2 mg dose as needed for diarrhea) 30 tablet 3  . megestrol (MEGACE) 40 MG/ML suspension Take 10  mLs (400 mg total) by mouth 2 (two) times daily. 480 mL 1  . ondansetron (ZOFRAN ODT) 4 MG disintegrating tablet Take 1 tablet (4 mg total) by mouth every 6 (six) hours. 120 tablet 1  . Pancrelipase, Lip-Prot-Amyl, (CREON) 24000-76000 units CPEP Take 48,000 units (2 capsules) with meals and 24000 units (1 capsule) with snacks (Patient taking differently: Take 24,000-48,000 Units by mouth See admin instructions. Take 48,000 units (2 capsules) with meals and 24000 units (1 capsule) with snacks) 240 capsule 2  . pantoprazole (PROTONIX) 40 MG tablet TAKE ONE TABLET BY MOUTH DAILY BEFORE BREAKFAST 90 tablet 1  . potassium chloride SA (KLOR-CON) 20 MEQ tablet Take 1 tablet (20 mEq total) by mouth daily. Take 1 tablet daily for 7 days 7 tablet 0  . pravastatin (PRAVACHOL) 40 MG tablet Take 1 tablet (40 mg total) by mouth at bedtime. 30 tablet 3  . traZODone (DESYREL) 100 MG tablet     . potassium chloride SA (KLOR-CON) 20 MEQ tablet Take one tab (40 meq) every 3 hours for 9 hours (3 tablets total). 3 tablet 0  . potassium chloride SA (KLOR-CON) 20 MEQ tablet Take 1 tablet (20 mEq total) by mouth 2 (two) times daily. 14 tablet 0   No current facility-administered medications for this visit.    ALLERGIES:  No Known Allergies  PHYSICAL EXAM:  Performance status  (ECOG): 1 - Symptomatic but completely ambulatory  Vitals:   09/03/20 1320  BP: (!) 170/83  Pulse: 70  Resp: 18  Temp: 98 F (36.7 C)  SpO2: 92%   Wt Readings from Last 3 Encounters:  09/03/20 106 lb 14.8 oz (48.5 kg)  08/13/20 100 lb (45.4 kg)  08/05/20 103 lb (46.7 kg)   Physical Exam Vitals reviewed.  Constitutional:      Appearance: Normal appearance.  Cardiovascular:     Rate and Rhythm: Normal rate and regular rhythm.     Pulses: Normal pulses.     Heart sounds: Normal heart sounds.  Pulmonary:     Effort: Pulmonary effort is normal.     Breath sounds: Normal breath sounds.  Abdominal:     Palpations: Abdomen is soft. There is no mass.     Tenderness: There is no abdominal tenderness.  Musculoskeletal:     Right lower leg: No edema.     Left lower leg: No edema.  Neurological:     General: No focal deficit present.     Mental Status: She is alert and oriented to person, place, and time.  Psychiatric:        Mood and Affect: Mood normal.        Behavior: Behavior normal.     LABORATORY DATA:  I have reviewed the labs as listed.  CBC Latest Ref Rng & Units 09/03/2020 08/13/2020 06/25/2020  WBC 4.0 - 10.5 K/uL 4.7 6.0 5.1  Hemoglobin 12.0 - 15.0 g/dL 7.7(L) 7.4(L) 9.0(L)  Hematocrit 36.0 - 46.0 % 25.8(L) 23.8(L) 29.2(L)  Platelets 150 - 400 K/uL 332 381 291   CMP Latest Ref Rng & Units 09/03/2020 08/14/2020 08/13/2020  Glucose 70 - 99 mg/dL 96 - 137(H)  BUN 8 - 23 mg/dL 8 - 10  Creatinine 0.44 - 1.00 mg/dL 0.80 - 0.83  Sodium 135 - 145 mmol/L 140 - 138  Potassium 3.5 - 5.1 mmol/L 2.5(LL) 3.2(L) 2.4(LL)  Chloride 98 - 111 mmol/L 104 - 108  CO2 22 - 32 mmol/L 28 - 21(L)  Calcium 8.9 - 10.3 mg/dL 8.0(L) -  7.5(L)  Total Protein 6.5 - 8.1 g/dL 5.9(L) - 5.9(L)  Total Bilirubin 0.3 - 1.2 mg/dL 0.4 - 0.4  Alkaline Phos 38 - 126 U/L 95 - 109  AST 15 - 41 U/L 29 - 12(L)  ALT 0 - 44 U/L 14 - 9      Component Value Date/Time   RBC 2.49 (L) 09/03/2020 1301   MCV  103.6 (H) 09/03/2020 1301   MCV 85 05/07/2020 1246   MCH 30.9 09/03/2020 1301   MCHC 29.8 (L) 09/03/2020 1301   RDW 15.5 09/03/2020 1301   RDW 13.4 05/07/2020 1246   LYMPHSABS 1.3 09/03/2020 1301   LYMPHSABS 1.8 05/07/2020 1246   MONOABS 0.4 09/03/2020 1301   EOSABS 0.1 09/03/2020 1301   EOSABS 0.1 05/07/2020 1246   BASOSABS 0.0 09/03/2020 1301   BASOSABS 0.0 05/07/2020 1246   Lab Results  Component Value Date   LDH 199 (H) 09/03/2020   LDH 178 08/13/2020   LDH 186 04/03/2020    DIAGNOSTIC IMAGING:  I have independently reviewed the scans and discussed with the patient. No results found.   ASSESSMENT:  1. Bence-Jones proteinuria: -24-hour urine done by Dr. Theador Hawthorne showed urine immunofixation positive for Bence-Jones proteinuria, kappa type. 24-hour total protein was 77 mg/day. -Free kappa light chains were 52.9, lambda light chains 29.8 with ratio of 1.78. Serum immunofixation was unremarkable. SPEP did not show any evidence of M spike. -Labs on 04/03/2020 shows negative immunofixation. Kappa light chain 61.3, lambda light chains 32.3, ratio 1.9. -Skeletal survey on 04/03/2020 was negative for lytic lesions.  2. Weight loss: -25 pound weight loss in the last 2 months. -She smoked cigarettes 1 pack/day for 46 years, and quit 3 years ago. -She reports that she cannot eat due to decreased appetite. Also feels very nauseous on smelling of foods. -EGD on 04/28/2020 with stomach and duodenal biopsy negative. -CT CAP on 05/11/2020 showed long segment colonic thickening likely diverticular disease. Stable small juxtapleural nodule in the right upper chest benign over 1 year. Moderate large fat-containing left inguinal hernia. Emphysema and diverticular calcification. -Colonoscopy on 06/08/2020 shows pancolonic diverticulosis, mild. Normal-appearing colonic mucosa otherwise. Random biopsies were negative.  3. Normocytic anemia: -CBC on 03/20/2020 shows hemoglobin 9.9 with MCV of  93. White count and platelets were normal. -Colonoscopy on 09/16/2019 shows diverticulosis in the entire examined colon. Nonbleeding internal hemorrhoids.  4. Rheumatoid arthritis: -She is on Plaquenil and Areva for the last 4 years.   PLAN:  1. Bence-Jones proteinuria: -Creatinine is 0.8.  Calcium is 8.0. -We will plan to repeat myeloma panel in January 2022  2. Weight loss: -She gained 3 pounds in the last 1 month. -Continue Megace 400 mg twice daily. -She is having more diarrhea.  She is being evaluated by GI. -She is on pancrelipase 2 capsules with meals and 1 capsule with snack.  3. Normocytic anemia: -Labs consistent with anemia from chronic inflammation. -Hemoglobin dropped from 9-7.4 and has slightly improved to 7.7 today.  LDH is normal. -She denies any bleeding per rectum or melena.  4. Depression: -Continue Celexa 40 mg daily.  5.  Hypokalemia: -Potassium today is 2.5.   -Was given 40 mEq every 3 hours for total of 120 mEq.  -Plan was to repeat potassium tomorrow morning but patient unable to make this appointment. -Repeat potassium on Monday.  -We will call patient with results. -She will get potassium 20 mEq daily for 7 days as well.  6.  Left lower extremity swelling: -Unclear etiology. -  Nonpainful. -Patient is on Megace to help with appetite.  This does increase her risk for blood clots. -Given swelling is unilateral would recommend ultrasound of left lower extremity. -She is not on anticoagulation.   Disposition: -RTC on Monday for repeat potassium level.  -RTC as scheduled for left lower extremity Doppler to rule out DVT -RTC in 1 month with repeat lab work (CBC, CMP, MM panel, ferritin, iron).   Orders placed this encounter:  Orders Placed This Encounter  Procedures  . US Venous Img Lower Unilateral Left  . Potassium   Greater than 50% was spent in counseling and coordination of care with this patient including but not limited to  discussion of the relevant topics above (See A&P) including, but not limited to diagnosis and management of acute and chronic medical conditions.   Faythe Casa, NP 09/03/2020 3:42 PM [place] [phone]

## 2020-09-04 ENCOUNTER — Other Ambulatory Visit (HOSPITAL_COMMUNITY): Payer: 59

## 2020-09-07 ENCOUNTER — Other Ambulatory Visit: Payer: Self-pay

## 2020-09-07 ENCOUNTER — Inpatient Hospital Stay (HOSPITAL_COMMUNITY): Payer: 59

## 2020-09-07 ENCOUNTER — Inpatient Hospital Stay (HOSPITAL_BASED_OUTPATIENT_CLINIC_OR_DEPARTMENT_OTHER): Payer: 59 | Admitting: Oncology

## 2020-09-07 ENCOUNTER — Ambulatory Visit (HOSPITAL_COMMUNITY)
Admission: RE | Admit: 2020-09-07 | Discharge: 2020-09-07 | Disposition: A | Discharge status: Home / Self Care | Payer: 59 | Source: Ambulatory Visit | Attending: Oncology | Admitting: Oncology

## 2020-09-07 DIAGNOSIS — E876 Hypokalemia: Secondary | ICD-10-CM

## 2020-09-07 DIAGNOSIS — R2242 Localized swelling, mass and lump, left lower limb: Secondary | ICD-10-CM | POA: Diagnosis not present

## 2020-09-07 DIAGNOSIS — D472 Monoclonal gammopathy: Secondary | ICD-10-CM

## 2020-09-07 LAB — POTASSIUM: Potassium: 4.1 mmol/L (ref 3.5–5.1)

## 2020-09-07 IMAGING — US US EXTREM LOW VENOUS*L*
1 series · 13 of 24 positions shown · non-contrast
Comparison: None.

CLINICAL DATA: Left lower extremity edema. Former smoker. History
of malignancy. Evaluate for DVT.



[Series 1: us venous img lower uni left (dvt) · portal-venous · 13 of 35 slices shown]
[im 1/35]
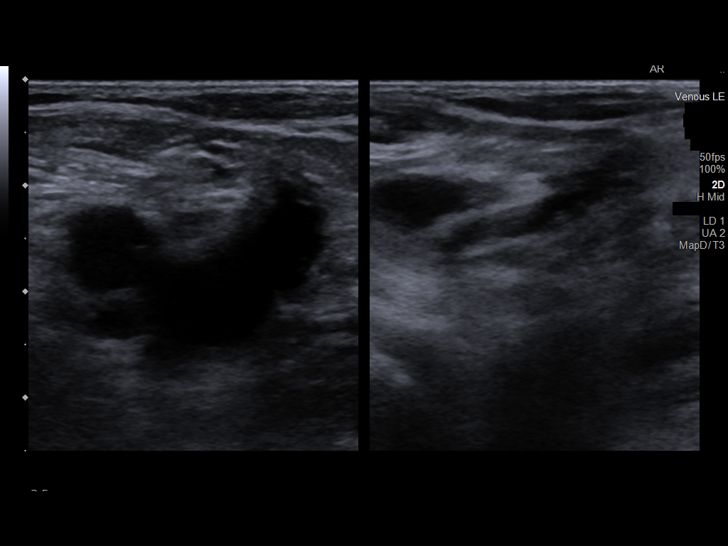
[im 3/35]
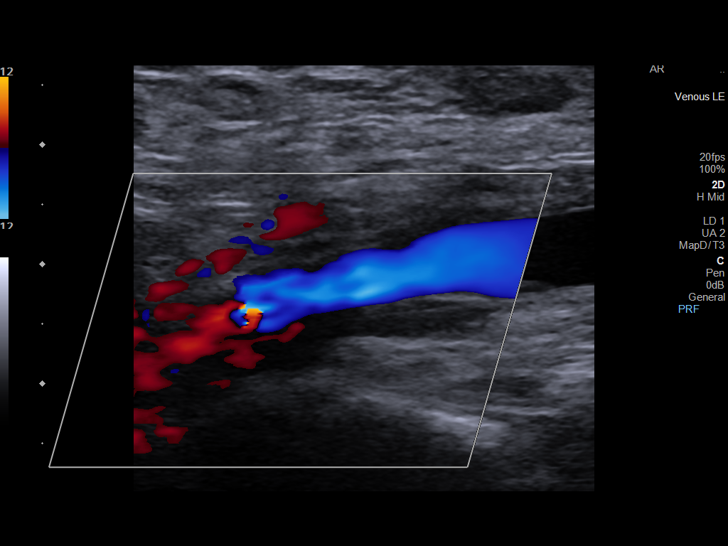
[im 6/35]
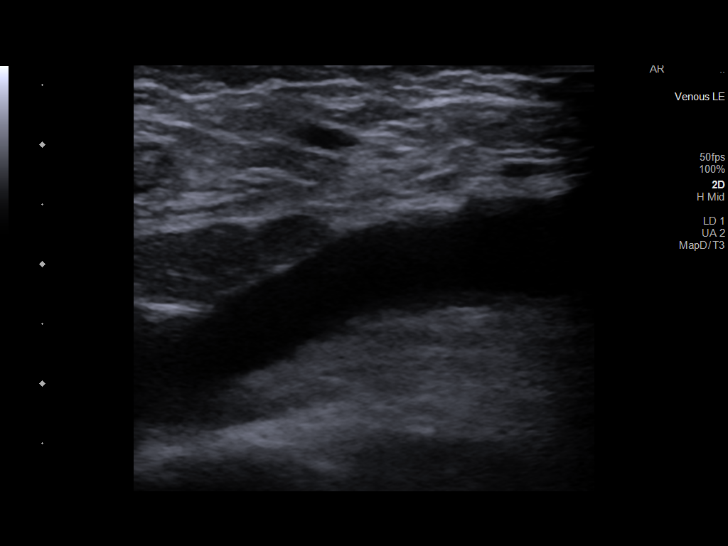
[im 9/35]
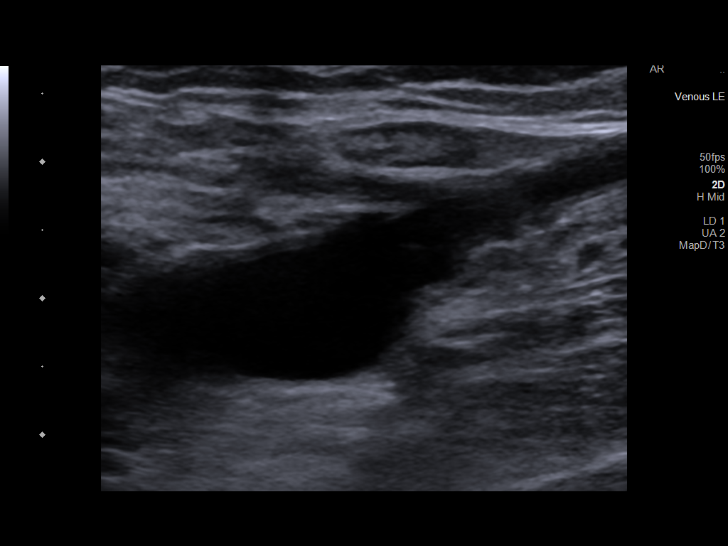
[im 12/35]
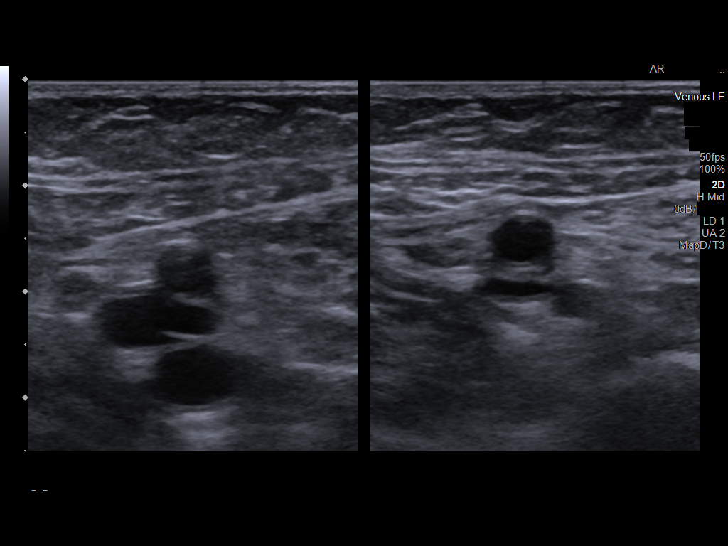
[im 15/35]
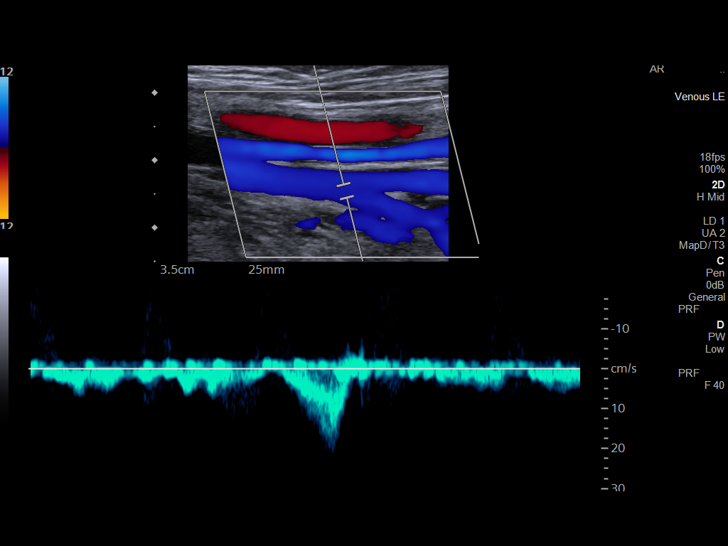
[im 18/35]
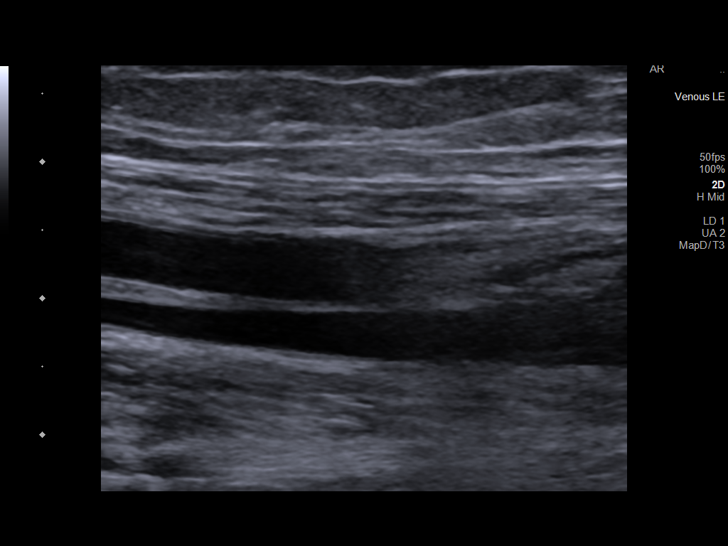
[im 20/35]
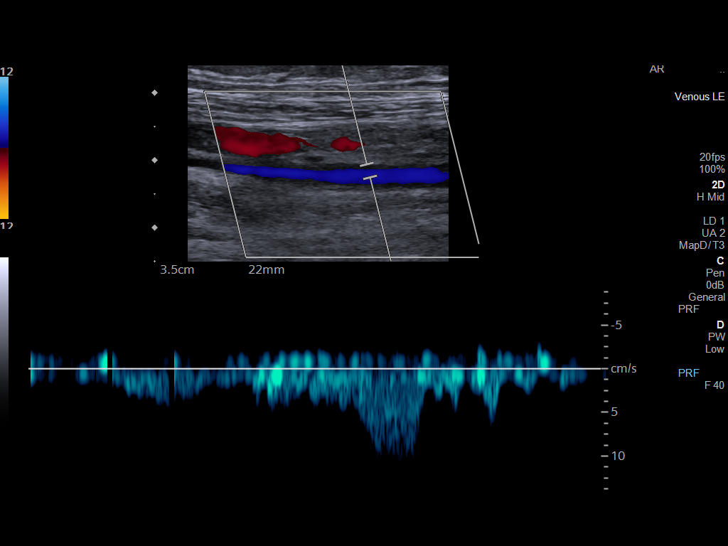
[im 23/35]
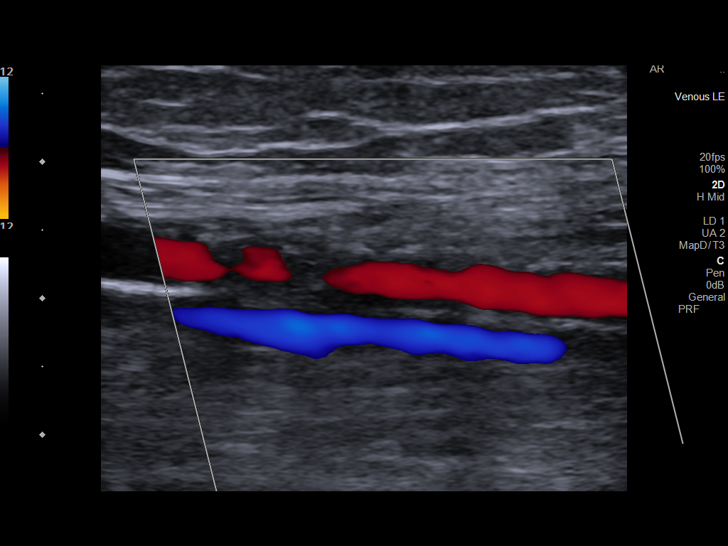
[im 26/35]
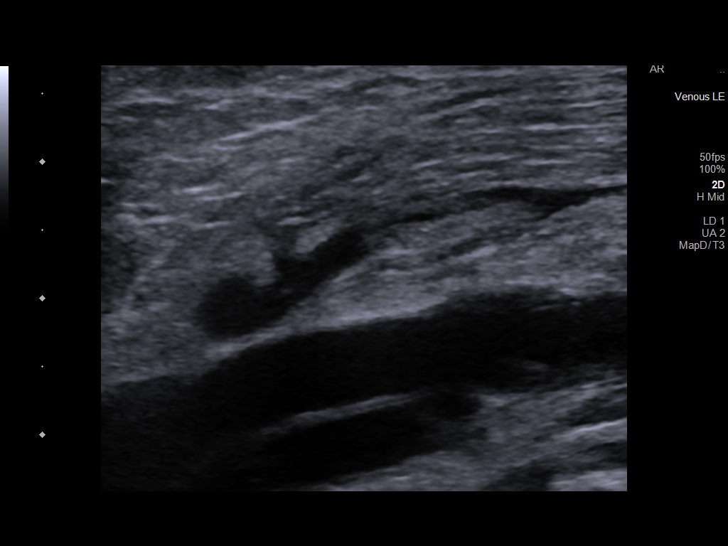
[im 29/35]
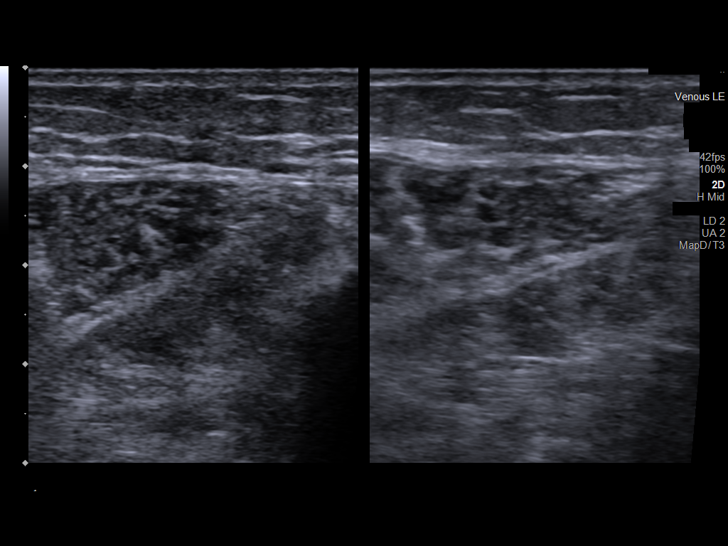
[im 32/35]
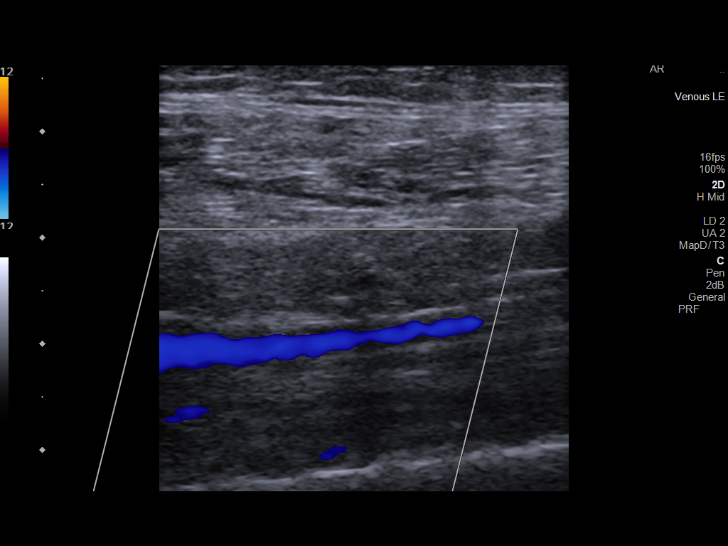
[im 35/35]
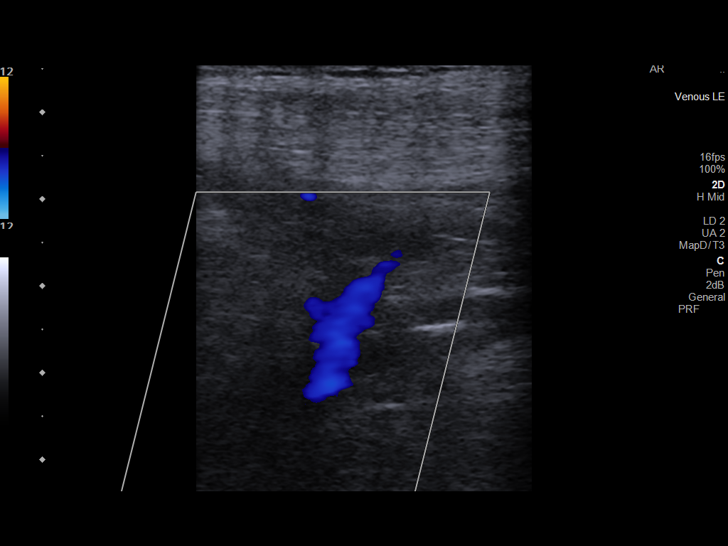

[13 of 24 positions shown; findings below may reference images not displayed]

FINDINGS: Contralateral Common Femoral Vein: Respiratory phasicity is normal
and symmetric with the symptomatic side. No evidence of thrombus.
Normal compressibility.

Common Femoral Vein: No evidence of thrombus. Normal
compressibility, respiratory phasicity and response to augmentation.

Saphenofemoral Junction: No evidence of thrombus. Normal
compressibility and flow on color Doppler imaging.

Profunda Femoral Vein: No evidence of thrombus. Normal
compressibility and flow on color Doppler imaging.

Femoral Vein: No evidence of thrombus. Normal compressibility,
respiratory phasicity and response to augmentation.

Popliteal Vein: No evidence of thrombus. Normal compressibility,
respiratory phasicity and response to augmentation.

Calf Veins: No evidence of thrombus. Normal compressibility and flow
on color Doppler imaging.

Superficial Great Saphenous Vein: No evidence of thrombus. Normal
compressibility.

Venous Reflux:  None.

Other Findings:  None.
IMPRESSION: No evidence of DVT within the left lower extremity.

## 2020-09-07 MED ORDER — POTASSIUM CHLORIDE CRYS ER 20 MEQ PO TBCR: 20.0000 meq | EXTENDED_RELEASE_TABLET | Freq: Every day | ORAL | 1 refills | Status: DC

## 2020-09-07 NOTE — Progress Notes (Signed)
Virtual Visit via Video Note  I connected with Erica Beasley on 09/07/20 at 10:15 AM EST by a video enabled telemedicine application and verified that I am speaking with the correct person using two identifiers.  Location: Patient: Home Provider: Clinic    I discussed the limitations of evaluation and management by telemedicine and the availability of in person appointments. The patient expressed understanding and agreed to proceed.  History of Present Illness: Mrs. Devaux is a 64 year old female with past medical history significant for anemia, monoclonal gammopathy and episodes of severe hypokalemia.  She is followed by Dr. Delton Coombes and was last seen in clinic on 08/13/2020.  She was found to have a potassium level of 2.4.  She was given potassium replacement and sent home with potassium supplements for 7 days.  She presented back to the cancer center on 09/03/2020 for routine follow-up and to recheck labs.  She was doing well except for left lower extremity swelling.  She denied any calf pain or shortness of breath.  Lab work showed persistent hypokalemia and worsening anemia.  Her LDH also remains elevated consistent with chronic inflammation.  She was given a prescription for potassium supplements tablets twice daily and asked to return to clinic to recheck lab work.  She was also asked to have a an ultrasound of her left leg d/t acute onset and possibility of developing a blood clot secondary to the use of Megace.  Today, she presents back to discuss results from repeat potassium check and ultrasound of left leg.    Observations/Objective:  Overall, she tells me she has been doing well.  She denies any bleeding, muscle aches, tachycardia or palpitations.  Taking her potassium as prescribed.  She continues to have intermittent diarrhea and is taking Imodium as needed.  No nausea or vomiting. Left lower extremity swelling is stable.  Assessment and Plan: Potassium level has improved and is  4.1 today.  She has been taking 20 mEq potassium twice daily for 3 days with much improvement.  I have asked that she continue potassium supplements once daily until her next appointment.  Diarrhea the likely source of hypokalemia.  Unclear of cause of diarrhea.  She has been seen by GI and had extensive work-up which appears to have been negative.  She was treated for C. difficile with multiple rounds of vancomycin and ultimately Dificid that improved back in August 2020 but never completely back to baseline.  Colonoscopy in December 2020 showed diverticulosis and several polyps but no worrisome findings.  Ultrasound of left lower extremity was negative for DVT.  We discussed other possibilities for acute swelling.  She does have follow-up with primary care provider at the end of the month for the same concern.  Significant anemia, likely secondary to chronic inflammation.  She has been worked up for iron deficiency and other vitamins which appear to have been stable.  SPEP was negative.  She has a persistently elevated LDH and history of RA.  On Plaquenil.  Follow Up Instructions:  RTC as scheduled on 10/07/2019 for repeat labs (CBC, CMP, LDH, ferritin, vitamin B12 and iron panel) and assessment.  She was instructed to call clinic if she developed any additional symptoms.  I discussed the assessment and treatment plan with the patient. The patient was provided an opportunity to ask questions and all were answered. The patient agreed with the plan and demonstrated an understanding of the instructions.   The patient was advised to call back or seek an in-person  evaluation if the symptoms worsen or if the condition fails to improve as anticipated.  I provided 25 minutes of non-face-to-face time during this encounter.   Jacquelin Hawking, NP

## 2020-09-14 ENCOUNTER — Other Ambulatory Visit: Payer: Self-pay | Admitting: *Deleted

## 2020-09-14 MED ORDER — PRAVASTATIN SODIUM 40 MG PO TABS: 40.0000 mg | ORAL_TABLET | Freq: Every day | ORAL | 0 refills | Status: DC

## 2020-09-14 MED ORDER — TRAZODONE HCL 100 MG PO TABS: 100.0000 mg | ORAL_TABLET | Freq: Every evening | ORAL | 0 refills | Status: DC | PRN

## 2020-09-14 MED ORDER — LOPERAMIDE HCL 2 MG PO TABS: 2.0000 mg | ORAL_TABLET | Freq: Every morning | ORAL | 0 refills | Status: DC

## 2020-09-22 ENCOUNTER — Ambulatory Visit (INDEPENDENT_AMBULATORY_CARE_PROVIDER_SITE_OTHER): Payer: 59 | Admitting: Nurse Practitioner

## 2020-09-22 ENCOUNTER — Encounter: Payer: Self-pay | Admitting: Nurse Practitioner

## 2020-09-22 ENCOUNTER — Other Ambulatory Visit: Payer: Self-pay

## 2020-09-22 VITALS — BP 144/87 | HR 91 | Temp 97.6°F | Ht 61.0 in | Wt 109.4 lb

## 2020-09-22 DIAGNOSIS — I10 Essential (primary) hypertension: Secondary | ICD-10-CM | POA: Insufficient documentation

## 2020-09-22 DIAGNOSIS — F339 Major depressive disorder, recurrent, unspecified: Secondary | ICD-10-CM | POA: Diagnosis not present

## 2020-09-22 MED ORDER — LISINOPRIL 5 MG PO TABS: 5.0000 mg | ORAL_TABLET | Freq: Every day | ORAL | 0 refills | Status: DC

## 2020-09-22 MED ORDER — CITALOPRAM HYDROBROMIDE 40 MG PO TABS: 40.0000 mg | ORAL_TABLET | Freq: Every day | ORAL | 0 refills | Status: DC

## 2020-09-22 NOTE — Patient Instructions (Addendum)
Hypertension, Adult Hypertension is another name for high blood pressure. High blood pressure forces your heart to work harder to pump blood. This can cause problems over time. There are two numbers in a blood pressure reading. There is a top number (systolic) over a bottom number (diastolic). It is best to have a blood pressure that is below 120/80. Healthy choices can help lower your blood pressure, or you may need medicine to help lower it. What are the causes? The cause of this condition is not known. Some conditions may be related to high blood pressure. What increases the risk?  Smoking.  Having type 2 diabetes mellitus, high cholesterol, or both.  Not getting enough exercise or physical activity.  Being overweight.  Having too much fat, sugar, calories, or salt (sodium) in your diet.  Drinking too much alcohol.  Having long-term (chronic) kidney disease.  Having a family history of high blood pressure.  Age. Risk increases with age.  Race. You may be at higher risk if you are African American.  Gender. Men are at higher risk than women before age 45. After age 65, women are at higher risk than men.  Having obstructive sleep apnea.  Stress. What are the signs or symptoms?  High blood pressure may not cause symptoms. Very high blood pressure (hypertensive crisis) may cause: ? Headache. ? Feelings of worry or nervousness (anxiety). ? Shortness of breath. ? Nosebleed. ? A feeling of being sick to your stomach (nausea). ? Throwing up (vomiting). ? Changes in how you see. ? Very bad chest pain. ? Seizures. How is this treated?  This condition is treated by making healthy lifestyle changes, such as: ? Eating healthy foods. ? Exercising more. ? Drinking less alcohol.  Your health care provider may prescribe medicine if lifestyle changes are not enough to get your blood pressure under control, and if: ? Your top number is above 130. ? Your bottom number is above  80.  Your personal target blood pressure may vary. Follow these instructions at home: Eating and drinking   If told, follow the DASH eating plan. To follow this plan: ? Fill one half of your plate at each meal with fruits and vegetables. ? Fill one fourth of your plate at each meal with whole grains. Whole grains include whole-wheat pasta, brown rice, and whole-grain bread. ? Eat or drink low-fat dairy products, such as skim milk or low-fat yogurt. ? Fill one fourth of your plate at each meal with low-fat (lean) proteins. Low-fat proteins include fish, chicken without skin, eggs, beans, and tofu. ? Avoid fatty meat, cured and processed meat, or chicken with skin. ? Avoid pre-made or processed food.  Eat less than 1,500 mg of salt each day.  Do not drink alcohol if: ? Your doctor tells you not to drink. ? You are pregnant, may be pregnant, or are planning to become pregnant.  If you drink alcohol: ? Limit how much you use to:  0-1 drink a day for women.  0-2 drinks a day for men. ? Be aware of how much alcohol is in your drink. In the [place] one drink equals one 12 oz bottle of beer (355 mL), one 5 oz glass of wine (148 mL), or one 1 oz glass of hard liquor (44 mL). Lifestyle   Work with your doctor to stay at a healthy weight or to lose weight. Ask your doctor what the best weight is for you.  Get at least 30 minutes of exercise most   days of the week. This may include walking, swimming, or biking.  Get at least 30 minutes of exercise that strengthens your muscles (resistance exercise) at least 3 days a week. This may include lifting weights or doing Pilates.  Do not use any products that contain nicotine or tobacco, such as cigarettes, e-cigarettes, and chewing tobacco. If you need help quitting, ask your doctor.  Check your blood pressure at home as told by your doctor.  Keep all follow-up visits as told by your doctor. This is important. Medicines  Take over-the-counter  and prescription medicines only as told by your doctor. Follow directions carefully.  Do not skip doses of blood pressure medicine. The medicine does not work as well if you skip doses. Skipping doses also puts you at risk for problems.  Ask your doctor about side effects or reactions to medicines that you should watch for. Contact a doctor if you:  Think you are having a reaction to the medicine you are taking.  Have headaches that keep coming back (recurring).  Feel dizzy.  Have swelling in your ankles.  Have trouble with your vision. Get help right away if you:  Get a very bad headache.  Start to feel mixed up (confused).  Feel weak or numb.  Feel faint.  Have very bad pain in your: ? Chest. ? Belly (abdomen).  Throw up more than once.  Have trouble breathing. Summary  Hypertension is another name for high blood pressure.  High blood pressure forces your heart to work harder to pump blood.  For most people, a normal blood pressure is less than 120/80.  Making healthy choices can help lower blood pressure. If your blood pressure does not get lower with healthy choices, you may need to take medicine. This information is not intended to replace advice given to you by your health care provider. Make sure you discuss any questions you have with your health care provider. Document Revised: 05/23/2018 Document Reviewed: 05/23/2018 Elsevier Patient Education  2020 [place] With Depression Everyone experiences occasional disappointment, sadness, and loss in their lives. When you are feeling down, blue, or sad for at least 2 weeks in a row, it may mean that you have depression. Depression can affect your thoughts and feelings, relationships, daily activities, and physical health. It is caused by changes in the way your brain functions. If you receive a diagnosis of depression, your health care provider will tell you which type of depression you have and what  treatment options are available to you. If you are living with depression, there are ways to help you recover from it and also ways to prevent it from coming back. How to cope with lifestyle changes Coping with stress     Stress is your body's reaction to life changes and events, both good and bad. Stressful situations may include: Getting married. The death of a spouse. Losing a job. Retiring. Having a baby. Stress can last just a few hours or it can be ongoing. Stress can play a major role in depression, so it is important to learn both how to cope with stress and how to think about it differently. Talk with your health care provider or a counselor if you would like to learn more about stress reduction. He or she may suggest some stress reduction techniques, such as: Music therapy. This can include creating music or listening to music. Choose music that you enjoy and that inspires you. Mindfulness-based meditation. This kind of meditation can  be done while sitting or walking. It involves being aware of your normal breaths, rather than trying to control your breathing. Centering prayer. This is a kind of meditation that involves focusing on a spiritual word or phrase. Choose a word, phrase, or sacred image that is meaningful to you and that brings you peace. Deep breathing. To do this, expand your stomach and inhale slowly through your nose. Hold your breath for 3-5 seconds, then exhale slowly, allowing your stomach muscles to relax. Muscle relaxation. This involves intentionally tensing muscles then relaxing them. Choose a stress reduction technique that fits your lifestyle and personality. Stress reduction techniques take time and practice to develop. Set aside 5-15 minutes a day to do them. Therapists can offer training in these techniques. The training may be covered by some insurance plans. Other things you can do to manage stress include: Keeping a stress diary. This can help you learn  what triggers your stress and ways to control your response. Understanding what your limits are and saying no to requests or events that lead to a schedule that is too full. Thinking about how you respond to certain situations. You may not be able to control everything, but you can control how you react. Adding humor to your life by watching funny films or TV shows. Making time for activities that help you relax and not feeling guilty about spending your time this way.  Medicines Your health care provider may suggest certain medicines if he or she feels that they will help improve your condition. Avoid using alcohol and other substances that may prevent your medicines from working properly (may interact). It is also important to: Talk with your pharmacist or health care provider about all the medicines that you take, their possible side effects, and what medicines are safe to take together. Make it your goal to take part in all treatment decisions (shared decision-making). This includes giving input on the side effects of medicines. It is best if shared decision-making with your health care provider is part of your total treatment plan. If your health care provider prescribes a medicine, you may not notice the full benefits of it for 4-8 weeks. Most people who are treated for depression need to be on medicine for at least 6-12 months after they feel better. If you are taking medicines as part of your treatment, do not stop taking medicines without first talking to your health care provider. You may need to have the medicine slowly decreased (tapered) over time to decrease the risk of harmful side effects. Relationships Your health care provider may suggest family therapy along with individual therapy and drug therapy. While there may not be family problems that are causing you to feel depressed, it is still important to make sure your family learns as much as they can about your mental health. Having  your family's support can help make your treatment successful. How to recognize changes in your condition Everyone has a different response to treatment for depression. Recovery from major depression happens when you have not had signs of major depression for two months. This may mean that you will start to: Have more interest in doing activities. Feel less hopeless than you did 2 months ago. Have more energy. Overeat less often, or have better or improving appetite. Have better concentration. Your health care provider will work with you to decide the next steps in your recovery. It is also important to recognize when your condition is getting worse. Watch for these signs:  Having fatigue or low energy. Eating too much or too little. Sleeping too much or too little. Feeling restless, agitated, or hopeless. Having trouble concentrating or making decisions. Having unexplained physical complaints. Feeling irritable, angry, or aggressive. Get help as soon as you or your family members notice these symptoms coming back. How to get support and help from others How to talk with friends and family members about your condition  Talking to friends and family members about your condition can provide you with one way to get support and guidance. Reach out to trusted friends or family members, explain your symptoms to them, and let them know that you are working with a health care provider to treat your depression. Financial resources Not all insurance plans cover mental health care, so it is important to check with your insurance carrier. If paying for co-pays or counseling services is a problem, search for a local or county mental health care center. They may be able to offer public mental health care services at low or no cost when you are not able to see a private health care provider. If you are taking medicine for depression, you may be able to get the generic form, which may be less expensive. Some  makers of prescription medicines also offer help to patients who cannot afford the medicines they need. Follow these instructions at home:  Get the right amount and quality of sleep. Cut down on using caffeine, tobacco, alcohol, and other potentially harmful substances. Try to exercise, such as walking or lifting small weights. Take over-the-counter and prescription medicines only as told by your health care provider. Eat a healthy diet that includes plenty of vegetables, fruits, whole grains, low-fat dairy products, and lean protein. Do not eat a lot of foods that are high in solid fats, added sugars, or salt. Keep all follow-up visits as told by your health care provider. This is important. Contact a health care provider if: You stop taking your antidepressant medicines, and you have any of these symptoms: Nausea. Headache. Feeling lightheaded. Chills and body aches. Not being able to sleep (insomnia). You or your friends and family think your depression is getting worse. Get help right away if: You have thoughts of hurting yourself or others. If you ever feel like you may hurt yourself or others, or have thoughts about taking your own life, get help right away. You can go to your nearest emergency department or call: Your local emergency services (911 in the [place] A suicide crisis helpline, such as the [facility] at 651-336-5004. This is open 24-hours a day. Summary If you are living with depression, there are ways to help you recover from it and also ways to prevent it from coming back. Work with your health care team to create a management plan that includes counseling, stress management techniques, and healthy lifestyle habits. This information is not intended to replace advice given to you by your health care provider. Make sure you discuss any questions you have with your health care provider. Document Revised: 01/04/2019 Document Reviewed:  08/15/2016 Elsevier Patient Education  [place].

## 2020-09-22 NOTE — Assessment & Plan Note (Signed)
Depression well controlled on Celexa 40 mg tablet by mouth daily.  Rx refill sent to pharmacy.

## 2020-09-22 NOTE — Progress Notes (Signed)
Established Patient Office Visit  Subjective:  Patient ID: Erica Beasley, female    DOB: 08/28/1956  Age: 64 y.o. MRN: [id number]  CC:  Chief Complaint  Patient presents with  . Hypertension    HPI Erica Beasley is a 64 year old female who presents to clinic with elevated blood pressure. Patient is reporting that this is new in the last few weeks. Patient reports having elevated blood pressures in the past and was on blood pressure medication but could not remember the name. Patient was taken off of blood pressure medication due to hypotension and weight loss. Patient is not experiencing any headache, dizziness or fatigue.    Depression: Patient complains of depression. She complains of depressed mood. Onset was approximately a few years ago, unchanged since that time.  She denies current suicidal and homicidal plan or intent.   Family history significant for no psychiatric illness.Possible organic causes contributing are: none.  Risk factors: previous episode of depression Previous treatment includes Celexa. She complains of the following side effects from the treatment: none.   Past Medical History:  Diagnosis Date  . COPD (chronic obstructive pulmonary disease) ([place])   . COVID-19 06/2019  . Depression   . Diabetes ([place])   . Hyperlipidemia   . Hypothyroidism   . Neuropathy   . Rheumatoid arthritis [facility])     Past Surgical History:  Procedure Laterality Date  . BIOPSY  09/16/2019   Procedure: BIOPSY;  Surgeon: Daneil Dolin, MD;  Location: [facility];  Service: Endoscopy;;  . BIOPSY  04/28/2020   Procedure: BIOPSY;  Surgeon: Daneil Dolin, MD;  [facility];  Service: Endoscopy;;  . BIOPSY  06/08/2020   Procedure: BIOPSY;  Surgeon: Daneil Dolin, MD;  [facility];  Service: Endoscopy;;  . CATARACT EXTRACTION Bilateral   . COLONOSCOPY  09/2010   Dr. Posey Pronto: mild diverticulsis in sigmoid colon.   . COLONOSCOPY WITH PROPOFOL N/A 09/16/2019    Rourk: Diverticulosis, random colon biopsies negative for microscopic colitis.  . COLONOSCOPY WITH PROPOFOL N/A 06/08/2020   Procedure: COLONOSCOPY WITH PROPOFOL;  Surgeon: Daneil Dolin, MD;  [facility];  Service: Endoscopy;  Laterality: N/A;  9:15am  . ESOPHAGOGASTRODUODENOSCOPY (EGD) WITH PROPOFOL N/A 04/28/2020   Procedure: ESOPHAGOGASTRODUODENOSCOPY (EGD) WITH PROPOFOL;  Surgeon: Daneil Dolin, MD;  [facility];  Service: Endoscopy;  Laterality: N/A;  2:15  . YAG LASER APPLICATION Left [id number]   Procedure: YAG LASER APPLICATION;  Surgeon: Williams Che, MD;  Location: [facility];  Service: Ophthalmology;  Laterality: Left;    Family History  Problem Relation Age of Onset  . Stroke Mother   . Heart disease Mother   . Diabetes Mother   . Breast cancer Mother   . Other Father        stomach taken out for some reason  . Migraines Daughter   . Colon cancer Neg Hx     Social History   Socioeconomic History  . Marital status: Widowed    Spouse name: Not on file  . Number of children: 1  . Years of education: Not on file  . Highest education level: Not on file  Occupational History  . Occupation: DISABLED  Tobacco Use  . Smoking status: Former Smoker    Packs/day: 1.00    Years: 15.00    Pack years: 15.00    Types: Cigarettes    Quit date: 09/11/2017    Years since quitting: 3.0  .  Smokeless tobacco: Former Network engineer  . Vaping Use: Never used  Substance and Sexual Activity  . Alcohol use: Never  . Drug use: Never  . Sexual activity: Not Currently  Other Topics Concern  . Not on file  Social History Narrative  . Not on file   Social Determinants of Health   Financial Resource Strain: Low Risk   . Difficulty of Paying Living Expenses: Not hard at all  Food Insecurity: No Food Insecurity  . Worried About Charity fundraiser in the Last Year: Never true  . Ran Out of Food in the Last Year: Never true  Transportation Needs: No  Transportation Needs  . Lack of Transportation (Medical): No  . Lack of Transportation (Non-Medical): No  Physical Activity: Insufficiently Active  . Days of Exercise per Week: 3 days  . Minutes of Exercise per Session: 30 min  Stress: No Stress Concern Present  . Feeling of Stress : Only a little  Social Connections: Socially Isolated  . Frequency of Communication with Friends and Family: More than three times a week  . Frequency of Social Gatherings with Friends and Family: More than three times a week  . Attends Religious Services: Never  . Active Member of Clubs or Organizations: No  . Attends Archivist Meetings: Never  . Marital Status: Widowed  Intimate Partner Violence: Not At Risk  . Fear of Current or Ex-Partner: No  . Emotionally Abused: No  . Physically Abused: No  . Sexually Abused: No    Outpatient Medications Prior to Visit  Medication Sig Dispense Refill  . Cholecalciferol (VITAMIN D3) 125 MCG (5000 UT) TABS Take 1 tablet (5,000 Units total) by mouth daily. 30 tablet 3  . citalopram (CELEXA) 40 MG tablet Take 0.5 tablets (20 mg total) by mouth daily. (Patient taking differently: Take 40 mg by mouth daily.) 30 tablet 6  . dicyclomine (BENTYL) 10 MG capsule Take 1 capsule (10 mg total) by mouth 2 (two) times daily as needed for spasms. 60 capsule 3  . ferrous sulfate 325 (65 FE) MG tablet Take 325 mg by mouth daily with breakfast.    . gabapentin (NEURONTIN) 300 MG capsule Take 1 capsule (300 mg total) by mouth 3 (three) times daily. (Patient taking differently: Take 300-600 mg by mouth See admin instructions. Take 300 mg in the morning and 600 mg at night) 90 capsule 3  . hydroxychloroquine (PLAQUENIL) 200 MG tablet Take 200 mg by mouth daily.     Marland Kitchen leflunomide (ARAVA) 20 MG tablet Take 20 mg by mouth daily.    Marland Kitchen levothyroxine (SYNTHROID) 100 MCG tablet Take 100 mcg by mouth every morning.    . loperamide (IMODIUM A-D) 2 MG tablet Take 1 tablet (2 mg total) by  mouth in the morning. Take second dose if needed 30 tablet 0  . megestrol (MEGACE) 40 MG/ML suspension Take 10 mLs (400 mg total) by mouth 2 (two) times daily. 480 mL 1  . ondansetron (ZOFRAN ODT) 4 MG disintegrating tablet Take 1 tablet (4 mg total) by mouth every 6 (six) hours. 120 tablet 1  . Pancrelipase, Lip-Prot-Amyl, (CREON) 24000-76000 units CPEP Take 48,000 units (2 capsules) with meals and 24000 units (1 capsule) with snacks (Patient taking differently: Take 24,000-48,000 Units by mouth See admin instructions. Take 48,000 units (2 capsules) with meals and 24000 units (1 capsule) with snacks) 240 capsule 2  . pantoprazole (PROTONIX) 40 MG tablet TAKE ONE TABLET BY MOUTH DAILY BEFORE BREAKFAST  90 tablet 1  . potassium chloride SA (KLOR-CON) 20 MEQ tablet Take 1 tablet (20 mEq total) by mouth daily. 90 tablet 1  . pravastatin (PRAVACHOL) 40 MG tablet Take 1 tablet (40 mg total) by mouth at bedtime. 30 tablet 0  . traZODone (DESYREL) 100 MG tablet Take 1 tablet (100 mg total) by mouth at bedtime as needed for sleep. 30 tablet 0  . potassium chloride SA (KLOR-CON) 20 MEQ tablet Take 1 tablet (20 mEq total) by mouth daily. Take 1 tablet daily for 7 days 7 tablet 0   No facility-administered medications prior to visit.    No Known Allergies  ROS Review of Systems  Psychiatric/Behavioral: Negative for self-injury and suicidal ideas. The patient is not nervous/anxious.        Depression  All other systems reviewed and are negative.     Objective:    Physical Exam Vitals reviewed.  Constitutional:      Appearance: Normal appearance.  HENT:     Head: Normocephalic.     Nose: Nose normal.  Eyes:     Conjunctiva/sclera: Conjunctivae normal.  Cardiovascular:     Pulses: Normal pulses.     Heart sounds: Normal heart sounds.  Pulmonary:     Effort: Pulmonary effort is normal.     Breath sounds: Normal breath sounds.  Abdominal:     General: Bowel sounds are normal.  Skin:     General: Skin is dry.  Neurological:     Mental Status: She is alert and oriented to person, place, and time.  Psychiatric:     Comments: Depression     BP (!) 144/87   Pulse 91   Temp 97.6 F (36.4 C)   Ht 5\' 1"  (1.549 m)   Wt 109 lb 6.4 oz (49.6 kg)   SpO2 97%   BMI 20.67 kg/m  Wt Readings from Last 3 Encounters:  09/22/20 109 lb 6.4 oz (49.6 kg)  09/03/20 106 lb 14.8 oz (48.5 kg)  08/13/20 100 lb (45.4 kg)     Health Maintenance Due  Topic Date Due  . HIV Screening  Never done  . PAP SMEAR-Modifier  Never done    There are no preventive care reminders to display for this patient.  Lab Results  Component Value Date   TSH 0.009 (L) 05/07/2020   Lab Results  Component Value Date   WBC 4.7 09/03/2020   HGB 7.7 (L) 09/03/2020   HCT 25.8 (L) 09/03/2020   MCV 103.6 (H) 09/03/2020   PLT 332 09/03/2020   Lab Results  Component Value Date   NA 140 09/03/2020   K 4.1 09/07/2020   CO2 28 09/03/2020   GLUCOSE 96 09/03/2020   BUN 8 09/03/2020   CREATININE 0.80 09/03/2020   BILITOT 0.4 09/03/2020   ALKPHOS 95 09/03/2020   AST 29 09/03/2020   ALT 14 09/03/2020   PROT 5.9 (L) 09/03/2020   ALBUMIN 3.0 (L) 09/03/2020   CALCIUM 8.0 (L) 09/03/2020   ANIONGAP 8 09/03/2020   Lab Results  Component Value Date   CHOL 110 05/07/2020   Lab Results  Component Value Date   HDL 42 05/07/2020   Lab Results  Component Value Date   LDLCALC 50 05/07/2020   Lab Results  Component Value Date   TRIG 92 05/07/2020   Lab Results  Component Value Date   CHOLHDL 2.6 05/07/2020   Lab Results  Component Value Date   HGBA1C 7.0 (H) 03/10/2020  Assessment & Plan:   Problem List Items Addressed This Visit      Cardiovascular and Mediastinum   Essential hypertension - Primary    Patient is experiencing elevated blood pressure. Started patient on 5 mg lisinopril 1 tablet daily. Provided education to patient with printed handouts given on diet and exercise.  Follow-up in 2 weeks bring blood pressure machine for calibration, keep blood pressure log.  Rx sent to pharmacy  Follow-up for worsening or unresolved symptoms         Relevant Medications   lisinopril (ZESTRIL) 5 MG tablet     Other   Depression, recurrent (HCC)    Depression well controlled on Celexa 40 mg tablet by mouth daily.  Rx refill sent to pharmacy.      Relevant Medications   citalopram (CELEXA) 40 MG tablet     Follow-up: Return in about 2 weeks (around 10/06/2020).    Ivy Lynn, NP

## 2020-09-22 NOTE — Assessment & Plan Note (Addendum)
Patient is experiencing elevated blood pressure. Started patient on 5 mg lisinopril 1 tablet daily. Provided education to patient with printed handouts given on diet and exercise. Follow-up in 2 weeks bring blood pressure machine for calibration, keep blood pressure log.  Rx sent to pharmacy  Follow-up for worsening or unresolved symptoms

## 2020-09-30 ENCOUNTER — Other Ambulatory Visit: Payer: Self-pay

## 2020-09-30 ENCOUNTER — Inpatient Hospital Stay (HOSPITAL_COMMUNITY): Payer: 59 | Attending: Hematology

## 2020-09-30 DIAGNOSIS — D649 Anemia, unspecified: Secondary | ICD-10-CM | POA: Insufficient documentation

## 2020-09-30 DIAGNOSIS — E538 Deficiency of other specified B group vitamins: Secondary | ICD-10-CM | POA: Insufficient documentation

## 2020-09-30 LAB — CBC WITH DIFFERENTIAL/PLATELET
Abs Immature Granulocytes: 0.01 10*3/uL (ref 0.00–0.07)
Basophils Absolute: 0 10*3/uL (ref 0.0–0.1)
Basophils Relative: 0 %
Eosinophils Absolute: 0.1 10*3/uL (ref 0.0–0.5)
Eosinophils Relative: 1 %
HCT: 33.5 % — ABNORMAL LOW (ref 36.0–46.0)
Hemoglobin: 9.9 g/dL — ABNORMAL LOW (ref 12.0–15.0)
Immature Granulocytes: 0 %
Lymphocytes Relative: 38 %
Lymphs Abs: 2.1 10*3/uL (ref 0.7–4.0)
MCH: 30.8 pg (ref 26.0–34.0)
MCHC: 29.6 g/dL — ABNORMAL LOW (ref 30.0–36.0)
MCV: 104.4 fL — ABNORMAL HIGH (ref 80.0–100.0)
Monocytes Absolute: 0.3 10*3/uL (ref 0.1–1.0)
Monocytes Relative: 5 %
Neutro Abs: 3.1 10*3/uL (ref 1.7–7.7)
Neutrophils Relative %: 56 %
Platelets: 301 10*3/uL (ref 150–400)
RBC: 3.21 MIL/uL — ABNORMAL LOW (ref 3.87–5.11)
RDW: 14.1 % (ref 11.5–15.5)
WBC: 5.6 10*3/uL (ref 4.0–10.5)
nRBC: 0 % (ref 0.0–0.2)

## 2020-09-30 LAB — COMPREHENSIVE METABOLIC PANEL
ALT: 27 U/L (ref 0–44)
AST: 55 U/L — ABNORMAL HIGH (ref 15–41)
Albumin: 3.7 g/dL (ref 3.5–5.0)
Alkaline Phosphatase: 85 U/L (ref 38–126)
Anion gap: 8 (ref 5–15)
BUN: 13 mg/dL (ref 8–23)
CO2: 25 mmol/L (ref 22–32)
Calcium: 8.2 mg/dL — ABNORMAL LOW (ref 8.9–10.3)
Chloride: 105 mmol/L (ref 98–111)
Creatinine, Ser: 0.78 mg/dL (ref 0.44–1.00)
GFR, Estimated: >60 mL/min (ref >60)
Glucose, Bld: 185 mg/dL — ABNORMAL HIGH (ref 70–99)
Potassium: 3.5 mmol/L (ref 3.5–5.1)
Sodium: 138 mmol/L (ref 135–145)
Total Bilirubin: 0.4 mg/dL (ref 0.3–1.2)
Total Protein: 6.9 g/dL (ref 6.5–8.1)

## 2020-09-30 LAB — FERRITIN: Ferritin: 214 ng/mL (ref 11–307)

## 2020-09-30 LAB — IRON AND TIBC
Iron: 77 ug/dL (ref 28–170)
Saturation Ratios: 25 % (ref 10.4–31.8)
TIBC: 303 ug/dL (ref 250–450)
UIBC: 226 ug/dL

## 2020-09-30 LAB — VITAMIN D 25 HYDROXY (VIT D DEFICIENCY, FRACTURES): Vit D, 25-Hydroxy: 66 ng/mL (ref 30–100)

## 2020-09-30 LAB — LACTATE DEHYDROGENASE: LDH: 176 U/L (ref 98–192)

## 2020-09-30 LAB — VITAMIN B12: Vitamin B-12: 138 pg/mL — ABNORMAL LOW (ref 180–914)

## 2020-10-05 ENCOUNTER — Other Ambulatory Visit: Payer: Self-pay

## 2020-10-05 ENCOUNTER — Ambulatory Visit (INDEPENDENT_AMBULATORY_CARE_PROVIDER_SITE_OTHER): Payer: 59 | Admitting: Nurse Practitioner

## 2020-10-05 ENCOUNTER — Encounter: Payer: Self-pay | Admitting: Nurse Practitioner

## 2020-10-05 VITALS — BP 148/86 | HR 79 | Temp 98.2°F | Ht 61.0 in | Wt 112.4 lb

## 2020-10-05 DIAGNOSIS — I10 Essential (primary) hypertension: Secondary | ICD-10-CM

## 2020-10-05 MED ORDER — LISINOPRIL 10 MG PO TABS: 10.0000 mg | ORAL_TABLET | Freq: Every day | ORAL | 0 refills | Status: DC

## 2020-10-05 MED ORDER — LEVOTHYROXINE SODIUM 100 MCG PO TABS: 100.0000 ug | ORAL_TABLET | Freq: Every morning | ORAL | 0 refills | Status: DC

## 2020-10-05 NOTE — Patient Instructions (Addendum)
Blood pressure not well managed, increase lisinopril from 5 mg tablet to 10 mg tablet.  Follow-up in 2 weeks.  . Hypertension, Adult Hypertension is another name for high blood pressure. High blood pressure forces your heart to work harder to pump blood. This can cause problems over time. There are two numbers in a blood pressure reading. There is a top number (systolic) over a bottom number (diastolic). It is best to have a blood pressure that is below 120/80. Healthy choices can help lower your blood pressure, or you may need medicine to help lower it. What are the causes? The cause of this condition is not known. Some conditions may be related to high blood pressure. What increases the risk?  Smoking.  Having type 2 diabetes mellitus, high cholesterol, or both.  Not getting enough exercise or physical activity.  Being overweight.  Having too much fat, sugar, calories, or salt (sodium) in your diet.  Drinking too much alcohol.  Having long-term (chronic) kidney disease.  Having a family history of high blood pressure.  Age. Risk increases with age.  Race. You may be at higher risk if you are African American.  Gender. Men are at higher risk than women before age 39. After age 58, women are at higher risk than men.  Having obstructive sleep apnea.  Stress. What are the signs or symptoms?  High blood pressure may not cause symptoms. Very high blood pressure (hypertensive crisis) may cause: ? Headache. ? Feelings of worry or nervousness (anxiety). ? Shortness of breath. ? Nosebleed. ? A feeling of being sick to your stomach (nausea). ? Throwing up (vomiting). ? Changes in how you see. ? Very bad chest pain. ? Seizures. How is this treated?  This condition is treated by making healthy lifestyle changes, such as: ? Eating healthy foods. ? Exercising more. ? Drinking less alcohol.  Your health care provider may prescribe medicine if lifestyle changes are not enough  to get your blood pressure under control, and if: ? Your top number is above 130. ? Your bottom number is above 80.  Your personal target blood pressure may vary. Follow these instructions at home: Eating and drinking  If told, follow the DASH eating plan. To follow this plan: ? Fill one half of your plate at each meal with fruits and vegetables. ? Fill one fourth of your plate at each meal with whole grains. Whole grains include whole-wheat pasta, brown rice, and whole-grain bread. ? Eat or drink low-fat dairy products, such as skim milk or low-fat yogurt. ? Fill one fourth of your plate at each meal with low-fat (lean) proteins. Low-fat proteins include fish, chicken without skin, eggs, beans, and tofu. ? Avoid fatty meat, cured and processed meat, or chicken with skin. ? Avoid pre-made or processed food.  Eat less than 1,500 mg of salt each day.  Do not drink alcohol if: ? Your doctor tells you not to drink. ? You are pregnant, may be pregnant, or are planning to become pregnant.  If you drink alcohol: ? Limit how much you use to:  0-1 drink a day for women.  0-2 drinks a day for men. ? Be aware of how much alcohol is in your drink. In the [place] one drink equals one 12 oz bottle of beer (355 mL), one 5 oz glass of wine (148 mL), or one 1 oz glass of hard liquor (44 mL).   Lifestyle  Work with your doctor to stay at a healthy weight or  to lose weight. Ask your doctor what the best weight is for you.  Get at least 30 minutes of exercise most days of the week. This may include walking, swimming, or biking.  Get at least 30 minutes of exercise that strengthens your muscles (resistance exercise) at least 3 days a week. This may include lifting weights or doing Pilates.  Do not use any products that contain nicotine or tobacco, such as cigarettes, e-cigarettes, and chewing tobacco. If you need help quitting, ask your doctor.  Check your blood pressure at home as told by your  doctor.  Keep all follow-up visits as told by your doctor. This is important.   Medicines  Take over-the-counter and prescription medicines only as told by your doctor. Follow directions carefully.  Do not skip doses of blood pressure medicine. The medicine does not work as well if you skip doses. Skipping doses also puts you at risk for problems.  Ask your doctor about side effects or reactions to medicines that you should watch for. Contact a doctor if you:  Think you are having a reaction to the medicine you are taking.  Have headaches that keep coming back (recurring).  Feel dizzy.  Have swelling in your ankles.  Have trouble with your vision. Get help right away if you:  Get a very bad headache.  Start to feel mixed up (confused).  Feel weak or numb.  Feel faint.  Have very bad pain in your: ? Chest. ? Belly (abdomen).  Throw up more than once.  Have trouble breathing. Summary  Hypertension is another name for high blood pressure.  High blood pressure forces your heart to work harder to pump blood.  For most people, a normal blood pressure is less than 120/80.  Making healthy choices can help lower blood pressure. If your blood pressure does not get lower with healthy choices, you may need to take medicine. This information is not intended to replace advice given to you by your health care provider. Make sure you discuss any questions you have with your health care provider. Document Revised: 05/23/2018 Document Reviewed: 05/23/2018 Elsevier Patient Education  2021 [employer].

## 2020-10-05 NOTE — Progress Notes (Signed)
Established Patient Office Visit  Subjective:  Patient ID: Erica Beasley, female    DOB: [birth year]  Age: 65 y.o. MRN: [id number]  CC:  Chief Complaint  Patient presents with  . Hypertension    2 week re check     HPI Erica Beasley  presents for follow up of hypertension. Patient was diagnosed in 09/22/2020. The patient is tolerating the medication well without side effects. Compliance with treatment has been good; including taking medication as directed , maintains a healthy diet and regular exercise regimen , and following up as directed.  Current medication lisinopril 5 mg tablet by mouth daily.  Hypothyroidism Erica Beasley is a 65 y.o. female who presents for follow up of hypothyroidism. Current symptoms: none . Symptoms have been well-controlled.   Past Medical History:  Diagnosis Date  . COPD (chronic obstructive pulmonary disease) ([place])   . COVID-19 06/2019  . Depression   . Diabetes ([place])   . Hyperlipidemia   . Hypothyroidism   . Neuropathy   . Rheumatoid arthritis [facility])     Past Surgical History:  Procedure Laterality Date  . BIOPSY  09/16/2019   Procedure: BIOPSY;  Surgeon: Daneil Dolin, MD;  Location: [facility];  Service: Endoscopy;;  . BIOPSY  04/28/2020   Procedure: BIOPSY;  Surgeon: Daneil Dolin, MD;  [facility];  Service: Endoscopy;;  . BIOPSY  06/08/2020   Procedure: BIOPSY;  Surgeon: Daneil Dolin, MD;  [facility]: [facility];  Service: Endoscopy;;  . CATARACT EXTRACTION Bilateral   . COLONOSCOPY  09/2010   Dr. Posey Pronto: mild diverticulsis in sigmoid colon.   . COLONOSCOPY WITH PROPOFOL N/A 09/16/2019   Rourk: Diverticulosis, random colon biopsies negative for microscopic colitis.  . COLONOSCOPY WITH PROPOFOL N/A 06/08/2020   Procedure: COLONOSCOPY WITH PROPOFOL;  Surgeon: Daneil Dolin, MD;  [facility];  Service: Endoscopy;  Laterality: N/A;  9:15am  . ESOPHAGOGASTRODUODENOSCOPY (EGD) WITH PROPOFOL N/A 04/28/2020    Procedure: ESOPHAGOGASTRODUODENOSCOPY (EGD) WITH PROPOFOL;  Surgeon: Daneil Dolin, MD;  [facility];  Service: Endoscopy;  Laterality: N/A;  2:15  . YAG LASER APPLICATION Left 70/26/3785   Procedure: YAG LASER APPLICATION;  Surgeon: Williams Che, MD;  Location: [facility];  Service: Ophthalmology;  Laterality: Left;    Family History  Problem Relation Age of Onset  . Stroke Mother   . Heart disease Mother   . Diabetes Mother   . Breast cancer Mother   . Other Father        stomach taken out for some reason  . Migraines Daughter   . Colon cancer Neg Hx     Social History   Socioeconomic History  . Marital status: Widowed    Spouse name: Not on file  . Number of children: 1  . Years of education: Not on file  . Highest education level: Not on file  Occupational History  . Occupation: DISABLED  Tobacco Use  . Smoking status: Former Smoker    Packs/day: 1.00    Years: 15.00    Pack years: 15.00    Types: Cigarettes    Quit date: 09/11/2017    Years since quitting: 3.0  . Smokeless tobacco: Former Network engineer  . Vaping Use: Never used  Substance and Sexual Activity  . Alcohol use: Never  . Drug use: Never  . Sexual activity: Not Currently  Other Topics Concern  . Not on file  Social  History Narrative  . Not on file   Social Determinants of Health   Financial Resource Strain: Low Risk   . Difficulty of Paying Living Expenses: Not hard at all  Food Insecurity: No Food Insecurity  . Worried About Charity fundraiser in the Last Year: Never true  . Ran Out of Food in the Last Year: Never true  Transportation Needs: No Transportation Needs  . Lack of Transportation (Medical): No  . Lack of Transportation (Non-Medical): No  Physical Activity: Insufficiently Active  . Days of Exercise per Week: 3 days  . Minutes of Exercise per Session: 30 min  Stress: No Stress Concern Present  . Feeling of Stress : Only a little  Social Connections: Socially  Isolated  . Frequency of Communication with Friends and Family: More than three times a week  . Frequency of Social Gatherings with Friends and Family: More than three times a week  . Attends Religious Services: Never  . Active Member of Clubs or Organizations: No  . Attends Archivist Meetings: Never  . Marital Status: Widowed  Intimate Partner Violence: Not At Risk  . Fear of Current or Ex-Partner: No  . Emotionally Abused: No  . Physically Abused: No  . Sexually Abused: No    Outpatient Medications Prior to Visit  Medication Sig Dispense Refill  . Cholecalciferol (VITAMIN D3) 125 MCG (5000 UT) TABS Take 1 tablet (5,000 Units total) by mouth daily. 30 tablet 3  . citalopram (CELEXA) 40 MG tablet Take 1 tablet (40 mg total) by mouth daily. 30 tablet 0  . dicyclomine (BENTYL) 10 MG capsule Take 1 capsule (10 mg total) by mouth 2 (two) times daily as needed for spasms. 60 capsule 3  . ferrous sulfate 325 (65 FE) MG tablet Take 325 mg by mouth daily with breakfast.    . gabapentin (NEURONTIN) 300 MG capsule Take 1 capsule (300 mg total) by mouth 3 (three) times daily. (Patient taking differently: Take 300-600 mg by mouth See admin instructions. Take 300 mg in the morning and 600 mg at night) 90 capsule 3  . hydroxychloroquine (PLAQUENIL) 200 MG tablet Take 200 mg by mouth daily.     Marland Kitchen leflunomide (ARAVA) 20 MG tablet Take 20 mg by mouth daily.    Marland Kitchen levothyroxine (SYNTHROID) 100 MCG tablet Take 100 mcg by mouth every morning.    Marland Kitchen lisinopril (ZESTRIL) 5 MG tablet Take 1 tablet (5 mg total) by mouth daily. 30 tablet 0  . loperamide (IMODIUM A-D) 2 MG tablet Take 1 tablet (2 mg total) by mouth in the morning. Take second dose if needed 30 tablet 0  . megestrol (MEGACE) 40 MG/ML suspension Take 10 mLs (400 mg total) by mouth 2 (two) times daily. 480 mL 1  . ondansetron (ZOFRAN ODT) 4 MG disintegrating tablet Take 1 tablet (4 mg total) by mouth every 6 (six) hours. 120 tablet 1  .  Pancrelipase, Lip-Prot-Amyl, (CREON) 24000-76000 units CPEP Take 48,000 units (2 capsules) with meals and 24000 units (1 capsule) with snacks (Patient taking differently: Take 24,000-48,000 Units by mouth See admin instructions. Take 48,000 units (2 capsules) with meals and 24000 units (1 capsule) with snacks) 240 capsule 2  . pantoprazole (PROTONIX) 40 MG tablet TAKE ONE TABLET BY MOUTH DAILY BEFORE BREAKFAST 90 tablet 1  . potassium chloride SA (KLOR-CON) 20 MEQ tablet Take 1 tablet (20 mEq total) by mouth daily. 90 tablet 1  . pravastatin (PRAVACHOL) 40 MG tablet Take 1 tablet (40  mg total) by mouth at bedtime. 30 tablet 0  . traZODone (DESYREL) 100 MG tablet Take 1 tablet (100 mg total) by mouth at bedtime as needed for sleep. 30 tablet 0   No facility-administered medications prior to visit.    No Known Allergies  ROS Review of Systems  Constitutional: Negative.   HENT: Negative.   Eyes: Negative.   Respiratory: Negative.   Cardiovascular: Negative.   Musculoskeletal: Negative.   Skin: Negative.   Neurological: Negative for light-headedness and headaches.  All other systems reviewed and are negative.     Objective:    Physical Exam Vitals reviewed.  Constitutional:      General: She is awake.     Appearance: Normal appearance.     Interventions: Face mask in place.  HENT:     Head: Normocephalic.     Nose: Nose normal.  Eyes:     Pupils: Pupils are equal, round, and reactive to light.  Cardiovascular:     Rate and Rhythm: Normal rate and regular rhythm.     Pulses: Normal pulses.     Heart sounds: Normal heart sounds.  Pulmonary:     Effort: Pulmonary effort is normal.  Abdominal:     General: Bowel sounds are normal.  Skin:    General: Skin is warm.  Neurological:     Mental Status: She is alert and oriented to person, place, and time.  Psychiatric:        Mood and Affect: Mood normal.        Behavior: Behavior normal. Behavior is cooperative.     BP (!)  148/86   Pulse 79   Temp 98.2 F (36.8 C) (Temporal)   Ht 5\' 1"  (1.549 m)   Wt 112 lb 6.4 oz (51 kg)   BMI 21.24 kg/m  Wt Readings from Last 3 Encounters:  10/05/20 112 lb 6.4 oz (51 kg)  09/22/20 109 lb 6.4 oz (49.6 kg)  09/03/20 106 lb 14.8 oz (48.5 kg)     Health Maintenance Due  Topic Date Due  . PAP SMEAR-Modifier  Never done    There are no preventive care reminders to display for this patient.  Lab Results  Component Value Date   TSH 0.009 (L) 05/07/2020   Lab Results  Component Value Date   WBC 5.6 09/30/2020   HGB 9.9 (L) 09/30/2020   HCT 33.5 (L) 09/30/2020   MCV 104.4 (H) 09/30/2020   PLT 301 09/30/2020   Lab Results  Component Value Date   NA 138 09/30/2020   K 3.5 09/30/2020   CO2 25 09/30/2020   GLUCOSE 185 (H) 09/30/2020   BUN 13 09/30/2020   CREATININE 0.78 09/30/2020   BILITOT 0.4 09/30/2020   ALKPHOS 85 09/30/2020   AST 55 (H) 09/30/2020   ALT 27 09/30/2020   PROT 6.9 09/30/2020   ALBUMIN 3.7 09/30/2020   CALCIUM 8.2 (L) 09/30/2020   ANIONGAP 8 09/30/2020   Lab Results  Component Value Date   CHOL 110 05/07/2020   Lab Results  Component Value Date   HDL 42 05/07/2020   Lab Results  Component Value Date   LDLCALC 50 05/07/2020   Lab Results  Component Value Date   TRIG 92 05/07/2020   Lab Results  Component Value Date   CHOLHDL 2.6 05/07/2020   Lab Results  Component Value Date   HGBA1C 7.0 (H) 03/10/2020     Assessment & Plan:   Problem List Items Addressed This Visit  Cardiovascular and Mediastinum   Essential hypertension - Primary    Essential hypertension not well controlled on lisinopril 5 mg tablet daily.  Dose changed to 10 mg tablet by mouth daily.  Provided education to patient with printed handouts given.  Patient to follow-up in 2 weeks.  Continue low-sodium diet and exercise regimen as tolerated. Rx sent to pharmacy.      Relevant Medications   lisinopril (ZESTRIL) 10 MG tablet      Meds  ordered this encounter  Medications  . levothyroxine (SYNTHROID) 100 MCG tablet    Sig: Take 1 tablet (100 mcg total) by mouth every morning.    Dispense:  90 tablet    Refill:  0  . lisinopril (ZESTRIL) 10 MG tablet    Sig: Take 1 tablet (10 mg total) by mouth daily.    Dispense:  60 tablet    Refill:  0    Order Specific Question:   Supervising Provider    Answer:   Janora Norlander [[id number]]    Follow-up: Return in about 2 weeks (around 10/19/2020).    Ivy Lynn, NP

## 2020-10-05 NOTE — Assessment & Plan Note (Signed)
Essential hypertension not well controlled on lisinopril 5 mg tablet daily.  Dose changed to 10 mg tablet by mouth daily.  Provided education to patient with printed handouts given.  Patient to follow-up in 2 weeks.  Continue low-sodium diet and exercise regimen as tolerated. Rx sent to pharmacy.

## 2020-10-06 ENCOUNTER — Inpatient Hospital Stay (HOSPITAL_BASED_OUTPATIENT_CLINIC_OR_DEPARTMENT_OTHER): Payer: 59 | Admitting: Oncology

## 2020-10-06 ENCOUNTER — Other Ambulatory Visit (HOSPITAL_COMMUNITY): Payer: Self-pay | Admitting: *Deleted

## 2020-10-06 VITALS — BP 144/70 | HR 72 | Temp 96.9°F | Resp 18 | Wt 112.6 lb

## 2020-10-06 DIAGNOSIS — D472 Monoclonal gammopathy: Secondary | ICD-10-CM | POA: Diagnosis not present

## 2020-10-06 DIAGNOSIS — D649 Anemia, unspecified: Secondary | ICD-10-CM | POA: Diagnosis not present

## 2020-10-06 DIAGNOSIS — E538 Deficiency of other specified B group vitamins: Secondary | ICD-10-CM | POA: Diagnosis not present

## 2020-10-06 MED ORDER — CYANOCOBALAMIN 1000 MCG/ML IJ SOLN
1000.0000 ug | Freq: Once | INTRAMUSCULAR | Status: AC
  Administered 2020-10-06: 1000 ug via INTRAMUSCULAR

## 2020-10-06 MED ORDER — CYANOCOBALAMIN 1000 MCG/ML IJ SOLN
INTRAMUSCULAR | Status: AC
  Filled 2020-10-06: qty 1

## 2020-10-06 MED ORDER — CYANOCOBALAMIN 1000 MCG/ML IJ SOLN: 1000.0000 ug | Freq: Once | INTRAMUSCULAR | Status: DC

## 2020-10-06 NOTE — Progress Notes (Signed)
.  Erica Beasley presents today for injection per the provider's orders. B12 administration without incident; injection site WNL; see MAR for injection details.  Patient tolerated procedure well and without incident.  No questions or complaints noted at this time. Pt discharged ambulatory in satisfactory condition.

## 2020-10-06 NOTE — Progress Notes (Signed)
Erica Beasley, [place] [id number]   CLINIC:  Medical Oncology/Hematology  PCP:  Erica Beasley, [place] / [place] [address]  [phone]  REASON FOR VISIT:  Follow-up for monoclonal gammopathy & normocytic anemia  PRIOR THERAPY: None  CURRENT THERAPY: Observation  INTERVAL HISTORY:  [name], a 65 y.o. female, returns for routine follow-up for her monoclonal gammopathy and normocytic anemia. Erica Beasley was last seen on 09/07/20 for lab follow-up secondary to low potassium levels.  Patient has required several doses of IV potassium due to critically low serum potassium levels thought to be due to diarrhea.  She is currently taking 20 mEq potassium daily with tolerance.  She has had extensive work-up for diarrhea. She has been seen by GI and work-up thus far has been negative.  She was treated for C. difficile with multiple rounds of vancomycin and ultimately Dificid that improved back in August 2020 but never completely back to baseline.  Colonoscopy in December 2020 showed diverticulosis and several polyps but no worrisome findings.  She also had ultrasound of left lower extremity secondary to acute swelling and discomfort in the use of Megace.  This was negative.  Symptoms resolved.  She presents back to clinic today for additional lab work.  She states her diarrhea has almost completely resolved.  She no longer requires Imodium.  Her energy levels have improved.  Her appetite has improved and she continues to gain weight.  She denies any new pains.  Swelling in left lower extremity has resolved. She denies any shortness of breath or chest pain.    REVIEW OF SYSTEMS:  Review of Systems    PAST MEDICAL/SURGICAL HISTORY:  Past Medical History:  Diagnosis Date  . COPD (chronic obstructive pulmonary disease) ([place])   . COVID-19 06/2019  . Depression   . Diabetes ([place])   . Hyperlipidemia   . Hypothyroidism   . Neuropathy    . Rheumatoid arthritis [facility])    Past Surgical History:  Procedure Laterality Date  . BIOPSY  09/16/2019   Procedure: BIOPSY;  Surgeon: Daneil Dolin, MD;  [facility]: [facility];  Service: Endoscopy;;  . BIOPSY  04/28/2020   Procedure: BIOPSY;  Surgeon: Daneil Dolin, MD;  [facility];  Service: Endoscopy;;  . BIOPSY  06/08/2020   Procedure: BIOPSY;  Surgeon: Daneil Dolin, MD;  [facility];  Service: Endoscopy;;  . CATARACT EXTRACTION Bilateral   . COLONOSCOPY  09/2010   Dr. Posey Pronto: mild diverticulsis in sigmoid colon.   . COLONOSCOPY WITH PROPOFOL N/A 09/16/2019   Rourk: Diverticulosis, random colon biopsies negative for microscopic colitis.  . COLONOSCOPY WITH PROPOFOL N/A 06/08/2020   Procedure: COLONOSCOPY WITH PROPOFOL;  Surgeon: Daneil Dolin, MD;  [facility];  Service: Endoscopy;  Laterality: N/A;  9:15am  . ESOPHAGOGASTRODUODENOSCOPY (EGD) WITH PROPOFOL N/A 04/28/2020   Procedure: ESOPHAGOGASTRODUODENOSCOPY (EGD) WITH PROPOFOL;  Surgeon: Daneil Dolin, MD;  [facility];  Service: Endoscopy;  Laterality: N/A;  2:15  . YAG LASER APPLICATION Left 73/71/0626   Procedure: YAG LASER APPLICATION;  Surgeon: Williams Che, MD;  Location: [facility];  Service: Ophthalmology;  Laterality: Left;    SOCIAL HISTORY:  Social History   Socioeconomic History  . Marital status: Widowed    Spouse name: Not on file  . Number of children: 1  . Years of education: Not on file  . Highest education level:  Not on file  Occupational History  . Occupation: DISABLED  Tobacco Use  . Smoking status: Former Smoker    Packs/day: 1.00    Years: 15.00    Pack years: 15.00    Types: Cigarettes    Quit date: 09/11/2017    Years since quitting: 3.0  . Smokeless tobacco: Former Network engineer  . Vaping Use: Never used  Substance and Sexual Activity  . Alcohol use: Never  . Drug use: Never  . Sexual activity: Not Currently  Other Topics  Concern  . Not on file  Social History Narrative  . Not on file   Social Determinants of Health   Financial Resource Strain: Low Risk   . Difficulty of Paying Living Expenses: Not hard at all  Food Insecurity: No Food Insecurity  . Worried About Charity fundraiser in the Last Year: Never true  . Ran Out of Food in the Last Year: Never true  Transportation Needs: No Transportation Needs  . Lack of Transportation (Medical): No  . Lack of Transportation (Non-Medical): No  Physical Activity: Insufficiently Active  . Days of Exercise per Week: 3 days  . Minutes of Exercise per Session: 30 min  Stress: No Stress Concern Present  . Feeling of Stress : Only a little  Social Connections: Socially Isolated  . Frequency of Communication with Friends and Family: More than three times a week  . Frequency of Social Gatherings with Friends and Family: More than three times a week  . Attends Religious Services: Never  . Active Member of Clubs or Organizations: No  . Attends Archivist Meetings: Never  . Marital Status: Widowed  Intimate Partner Violence: Not At Risk  . Fear of Current or Ex-Partner: No  . Emotionally Abused: No  . Physically Abused: No  . Sexually Abused: No    FAMILY HISTORY:  Family History  Problem Relation Age of Onset  . Stroke Mother   . Heart disease Mother   . Diabetes Mother   . Breast cancer Mother   . Other Father        stomach taken out for some reason  . Migraines Daughter   . Colon cancer Neg Hx     CURRENT MEDICATIONS:  Current Outpatient Medications  Medication Sig Dispense Refill  . Cholecalciferol (VITAMIN D3) 125 MCG (5000 UT) TABS Take 1 tablet (5,000 Units total) by mouth daily. 30 tablet 3  . citalopram (CELEXA) 40 MG tablet Take 1 tablet (40 mg total) by mouth daily. 30 tablet 0  . ferrous sulfate 325 (65 FE) MG tablet Take 325 mg by mouth daily with breakfast.    . gabapentin (NEURONTIN) 300 MG capsule Take 1 capsule (300 mg  total) by mouth 3 (three) times daily. (Patient taking differently: Take 300-600 mg by mouth See admin instructions. Take 300 mg in the morning and 600 mg at night) 90 capsule 3  . hydroxychloroquine (PLAQUENIL) 200 MG tablet Take 200 mg by mouth daily.     Marland Kitchen leflunomide (ARAVA) 20 MG tablet Take 20 mg by mouth daily.    Marland Kitchen levothyroxine (SYNTHROID) 100 MCG tablet Take 1 tablet (100 mcg total) by mouth every morning. 90 tablet 0  . lisinopril (ZESTRIL) 10 MG tablet Take 1 tablet (10 mg total) by mouth daily. 60 tablet 0  . loperamide (IMODIUM A-D) 2 MG tablet Take 1 tablet (2 mg total) by mouth in the morning. Take second dose if needed 30 tablet 0  .  Pancrelipase, Lip-Prot-Amyl, (CREON) 24000-76000 units CPEP Take 48,000 units (2 capsules) with meals and 24000 units (1 capsule) with snacks (Patient taking differently: Take 24,000-48,000 Units by mouth See admin instructions. Take 48,000 units (2 capsules) with meals and 24000 units (1 capsule) with snacks) 240 capsule 2  . pantoprazole (PROTONIX) 40 MG tablet TAKE ONE TABLET BY MOUTH DAILY BEFORE BREAKFAST 90 tablet 1  . potassium chloride SA (KLOR-CON) 20 MEQ tablet Take 1 tablet (20 mEq total) by mouth daily. 90 tablet 1  . pravastatin (PRAVACHOL) 40 MG tablet Take 1 tablet (40 mg total) by mouth at bedtime. 30 tablet 0  . predniSONE (DELTASONE) 5 MG tablet Take 5 mg by mouth daily.    . traZODone (DESYREL) 100 MG tablet Take 1 tablet (100 mg total) by mouth at bedtime as needed for sleep. 30 tablet 0   No current facility-administered medications for this visit.    ALLERGIES:  No Known Allergies  PHYSICAL EXAM:  Performance status (ECOG): 1 - Symptomatic but completely ambulatory  Vitals:   10/06/20 1245  BP: (!) 144/70  Pulse: 72  Resp: 18  Temp: (!) 96.9 F (36.1 C)  SpO2: 96%   Wt Readings from Last 3 Encounters:  10/06/20 112 lb 9.6 oz (51.1 kg)  10/05/20 112 lb 6.4 oz (51 kg)  09/22/20 109 lb 6.4 oz (49.6 kg)   Physical  Exam Vitals reviewed.  Constitutional:      Appearance: Normal appearance.  Cardiovascular:     Rate and Rhythm: Normal rate and regular rhythm.     Pulses: Normal pulses.     Heart sounds: Normal heart sounds.  Pulmonary:     Effort: Pulmonary effort is normal.     Breath sounds: Normal breath sounds.  Abdominal:     Palpations: Abdomen is soft. There is no mass.     Tenderness: There is no abdominal tenderness.  Musculoskeletal:     Right lower leg: No edema.     Left lower leg: No edema.  Neurological:     General: No focal deficit present.     Mental Status: She is alert and oriented to person, place, and time.  Psychiatric:        Mood and Affect: Mood normal.        Behavior: Behavior normal.     LABORATORY DATA:  I have reviewed the labs as listed.  CBC Latest Ref Rng & Units 09/30/2020 09/03/2020 08/13/2020  WBC 4.0 - 10.5 K/uL 5.6 4.7 6.0  Hemoglobin 12.0 - 15.0 g/dL 9.9(L) 7.7(L) 7.4(L)  Hematocrit 36.0 - 46.0 % 33.5(L) 25.8(L) 23.8(L)  Platelets 150 - 400 K/uL 301 332 381   CMP Latest Ref Rng & Units 09/30/2020 09/07/2020 09/03/2020  Glucose 70 - 99 mg/dL 185(H) - 96  BUN 8 - 23 mg/dL 13 - 8  Creatinine 0.44 - 1.00 mg/dL 0.78 - 0.80  Sodium 135 - 145 mmol/L 138 - 140  Potassium 3.5 - 5.1 mmol/L 3.5 4.1 2.5(LL)  Chloride 98 - 111 mmol/L 105 - 104  CO2 22 - 32 mmol/L 25 - 28  Calcium 8.9 - 10.3 mg/dL 8.2(L) - 8.0(L)  Total Protein 6.5 - 8.1 g/dL 6.9 - 5.9(L)  Total Bilirubin 0.3 - 1.2 mg/dL 0.4 - 0.4  Alkaline Phos 38 - 126 U/L 85 - 95  AST 15 - 41 U/L 55(H) - 29  ALT 0 - 44 U/L 27 - 14      Component Value Date/Time   RBC 3.21 (L)  09/30/2020 1330   MCV 104.4 (H) 09/30/2020 1330   MCV 85 05/07/2020 1246   MCH 30.8 09/30/2020 1330   MCHC 29.6 (L) 09/30/2020 1330   RDW 14.1 09/30/2020 1330   RDW 13.4 05/07/2020 1246   LYMPHSABS 2.1 09/30/2020 1330   LYMPHSABS 1.8 05/07/2020 1246   MONOABS 0.3 09/30/2020 1330   EOSABS 0.1 09/30/2020 1330   EOSABS 0.1  05/07/2020 1246   BASOSABS 0.0 09/30/2020 1330   BASOSABS 0.0 05/07/2020 1246   Lab Results  Component Value Date   LDH 176 09/30/2020   LDH 199 (H) 09/03/2020   LDH 178 08/13/2020    DIAGNOSTIC IMAGING:  I have independently reviewed the scans and discussed with the patient. US Venous Img Lower Unilateral Left  Result Date: 09/07/2020 CLINICAL DATA:  Left lower extremity edema. Former smoker. History of malignancy. Evaluate for DVT. EXAM: LEFT LOWER EXTREMITY VENOUS DOPPLER ULTRASOUND TECHNIQUE: Gray-scale sonography with graded compression, as well as color Doppler and duplex ultrasound were performed to evaluate the lower extremity deep venous systems from the level of the common femoral vein and including the common femoral, femoral, profunda femoral, popliteal and calf veins including the posterior tibial, peroneal and gastrocnemius veins when visible. The superficial great saphenous vein was also interrogated. Spectral Doppler was utilized to evaluate flow at rest and with distal augmentation maneuvers in the common femoral, femoral and popliteal veins. COMPARISON:  None. FINDINGS: Contralateral Common Femoral Vein: Respiratory phasicity is normal and symmetric with the symptomatic side. No evidence of thrombus. Normal compressibility. Common Femoral Vein: No evidence of thrombus. Normal compressibility, respiratory phasicity and response to augmentation. Saphenofemoral Junction: No evidence of thrombus. Normal compressibility and flow on color Doppler imaging. Profunda Femoral Vein: No evidence of thrombus. Normal compressibility and flow on color Doppler imaging. Femoral Vein: No evidence of thrombus. Normal compressibility, respiratory phasicity and response to augmentation. Popliteal Vein: No evidence of thrombus. Normal compressibility, respiratory phasicity and response to augmentation. Calf Veins: No evidence of thrombus. Normal compressibility and flow on color Doppler imaging.  Superficial Great Saphenous Vein: No evidence of thrombus. Normal compressibility. Venous Reflux:  None. Other Findings:  None. IMPRESSION: No evidence of DVT within the left lower extremity. Electronically Signed   By: Sandi Mariscal M.D.   On: 09/07/2020 11:44     ASSESSMENT:  1. Bence-Jones proteinuria: -24-hour urine done by Dr. Theador Hawthorne showed urine immunofixation positive for Bence-Jones proteinuria, kappa type. 24-hour total protein was 77 mg/day. -Free kappa light chains were 52.9, lambda light chains 29.8 with ratio of 1.78. Serum immunofixation was unremarkable. SPEP did not show any evidence of M spike. -Labs on 04/03/2020 shows negative immunofixation. Kappa light chain 61.3, lambda light chains 32.3, ratio 1.9. -Skeletal survey on 04/03/2020 was negative for lytic lesions.  2. Weight loss: -25 pound weight loss in the last 2 months. -She smoked cigarettes 1 pack/day for 46 years, and quit 3 years ago. -She reports that she cannot eat due to decreased appetite. Also feels very nauseous on smelling of foods. -EGD on 04/28/2020 with stomach and duodenal biopsy negative. -CT CAP on 05/11/2020 showed long segment colonic thickening likely diverticular disease. Stable small juxtapleural nodule in the right upper chest benign over 1 year. Moderate large fat-containing left inguinal hernia. Emphysema and diverticular calcification. -Colonoscopy on 06/08/2020 shows pancolonic diverticulosis, mild. Normal-appearing colonic mucosa otherwise. Random biopsies were negative.  3. Normocytic anemia: -CBC on 03/20/2020 shows hemoglobin 9.9 with MCV of 93. White count and platelets were normal. -Colonoscopy on  09/16/2019 shows diverticulosis in the entire examined colon. Nonbleeding internal hemorrhoids.  4. Rheumatoid arthritis: -She is on Plaquenil and Areva for the last 4 years.   PLAN:  1. Bence-Jones proteinuria: -Creatinine is 0.78.  Calcium is 8.2. -Myeloma labs were supposed to  be drawn on 09/30/2020 lab draw.  They were left off.  She will return to clinic in 6 weeks for repeat labs and we will get them drawn then.  2. Weight loss: -She gained 3 pounds in the last 1 month. -Continue Megace 400 mg twice daily. - Diarrhea has improved.  She no longer is taking Imodium. -She is on pancrelipase 2 capsules with meals and 1 capsule with snack.  3. Normocytic anemia: -Labs consistent with anemia from chronic inflammation. - Hemoglobin from 09/30/2020 improved to 9.9 (7.7). -LDH is normal.  4. Depression: -Continue Celexa 40 mg daily.  5.  Hypokalemia: -Potassium level from 09/30/2020 is 3.5. -Continue 20 mEq potassium daily. -New prescription sent.  6.  Left lower extremity swelling: - Resolved -Previous work-up negative for DVT.  7.  B12 deficiency: - B12 level on 09/30/2020 138. -Proceed with B12 injection today. - We will send prescription for B12 injections monthly x6.  Her daughter works at a Center office and is able to get them to her at home. -Repeat B12 levels in 12 weeks.  Disposition: -RTC in 6 weeks for repeat labs only (CBC, CMP, immunofixation electrophoresis and kappa light chains). (Make sure myeloma are drawn in 6 weeks please) -RTC in 12 weeks for labs (CBC, CMP, B12) and MD assessment  Orders placed this encounter:  Orders Placed This Encounter  Procedures  . Immunofixation electrophoresis  . Kappa/lambda light chains   Greater than 50% was spent in counseling and coordination of care with this patient including but not limited to discussion of the relevant topics above (See A&P) including, but not limited to diagnosis and management of acute and chronic medical conditions.   Faythe Casa, NP 10/06/2020 1:12 PM [place] [phone]

## 2020-10-06 NOTE — Addendum Note (Signed)
Addended by: Henreitta Leber E on: 10/06/2020 02:10 PM   Modules accepted: Orders

## 2020-10-20 ENCOUNTER — Encounter: Payer: Self-pay | Admitting: Nurse Practitioner

## 2020-10-20 ENCOUNTER — Ambulatory Visit (INDEPENDENT_AMBULATORY_CARE_PROVIDER_SITE_OTHER): Payer: 59 | Admitting: Nurse Practitioner

## 2020-10-20 ENCOUNTER — Other Ambulatory Visit: Payer: Self-pay

## 2020-10-20 DIAGNOSIS — I10 Essential (primary) hypertension: Secondary | ICD-10-CM

## 2020-10-20 NOTE — Assessment & Plan Note (Signed)
Continue on current dose, advice patient to take blood pressure medication as directed, continue to keep a blood pressure log, low salt diet recommended and exercise as tolerated.  Follow-up with worsening or unresolved symptoms.

## 2020-10-20 NOTE — Patient Instructions (Addendum)
Arthritis Arthritis means joint pain. It can also mean joint disease. A joint is a place where bones come together. There are more than 100 types of arthritis. What are the causes? This condition may be caused by:  Wear and tear of a joint. This is the most common cause.  A lot of acid in the blood, which leads to pain in the joint (gout).  Pain and swelling (inflammation) in a joint.  Infection of a joint.  Injuries in the joint.  A reaction to medicines (allergy). In some cases, the cause may not be known. What are the signs or symptoms? Symptoms of this condition include:  Redness at a joint.  Swelling at a joint.  Stiffness at a joint.  Warmth coming from the joint.  A fever.  A feeling of being sick. How is this treated? This condition may be treated with:  Treating the cause, if it is known.  Rest.  Raising (elevating) the joint.  Putting cold or hot packs on the joint.  Medicines to treat symptoms and reduce pain and swelling.  Shots of medicines (cortisone) into the joint. You may also be told to make changes in your life, such as doing exercises and losing weight. Follow these instructions at home: Medicines  Take over-the-counter and prescription medicines only as told by your doctor.  Do not take aspirin for pain if your doctor says that you may have gout. Activity  Rest your joint if your doctor tells you to.  Avoid activities that make the pain worse.  Exercise your joint regularly as told by your doctor. Try doing exercises like: ? Swimming. ? Water aerobics. ? Biking. ? Walking. Managing pain, stiffness, and swelling  If told, put ice on the affected area. ? Put ice in a plastic bag. ? Place a towel between your skin and the bag. ? Leave the ice on for 20 minutes, 2-3 times per day.  If your joint is swollen, raise (elevate) it above the level of your heart if told by your doctor.  If your joint feels stiff in the morning, try  taking a warm shower.  If told, put heat on the affected area. Do this as often as told by your doctor. Use the heat source that your doctor recommends, such as a moist heat pack or a heating pad. If you have diabetes, do not apply heat without asking your doctor. To apply heat: ? Place a towel between your skin and the heat source. ? Leave the heat on for 20-30 minutes. ? Remove the heat if your skin turns bright red. This is very important if you are unable to feel pain, heat, or cold. You may have a greater risk of getting burned.      General instructions  Do not use any products that contain nicotine or tobacco, such as cigarettes, e-cigarettes, and chewing tobacco. If you need help quitting, ask your doctor.  Keep all follow-up visits as told by your doctor. This is important. Contact a doctor if:  The pain gets worse.  You have a fever. Get help right away if:  You have very bad pain in your joint.  You have swelling in your joint.  Your joint is red.  Many joints become painful and swollen.  You have very bad back pain.  Your leg is very weak.  You cannot control your pee (urine) or poop (stool). Summary  Arthritis means joint pain. It can also mean joint disease. A joint is a  place where bones come together.  The most common cause of this condition is wear and tear of a joint.  Symptoms of this condition include redness, swelling, or stiffness of the joint.  This condition is treated with rest, raising the joint, medicines, and putting cold or hot packs on the joint.  Follow your doctor's instructions about medicines, activity, exercises, and other home care treatments. This information is not intended to replace advice given to you by your health care provider. Make sure you discuss any questions you have with your health care provider. Document Revised: 08/20/2018 Document Reviewed: 08/20/2018 Elsevier Patient Education  2021 [employer]. Hypertension,  Adult Hypertension is another name for high blood pressure. High blood pressure forces your heart to work harder to pump blood. This can cause problems over time. There are two numbers in a blood pressure reading. There is a top number (systolic) over a bottom number (diastolic). It is best to have a blood pressure that is below 120/80. Healthy choices can help lower your blood pressure, or you may need medicine to help lower it. What are the causes? The cause of this condition is not known. Some conditions may be related to high blood pressure. What increases the risk?  Smoking.  Having type 2 diabetes mellitus, high cholesterol, or both.  Not getting enough exercise or physical activity.  Being overweight.  Having too much fat, sugar, calories, or salt (sodium) in your diet.  Drinking too much alcohol.  Having long-term (chronic) kidney disease.  Having a family history of high blood pressure.  Age. Risk increases with age.  Race. You may be at higher risk if you are African American.  Gender. Men are at higher risk than women before age 63. After age 61, women are at higher risk than men.  Having obstructive sleep apnea.  Stress. What are the signs or symptoms?  High blood pressure may not cause symptoms. Very high blood pressure (hypertensive crisis) may cause: ? Headache. ? Feelings of worry or nervousness (anxiety). ? Shortness of breath. ? Nosebleed. ? A feeling of being sick to your stomach (nausea). ? Throwing up (vomiting). ? Changes in how you see. ? Very bad chest pain. ? Seizures. How is this treated?  This condition is treated by making healthy lifestyle changes, such as: ? Eating healthy foods. ? Exercising more. ? Drinking less alcohol.  Your health care provider may prescribe medicine if lifestyle changes are not enough to get your blood pressure under control, and if: ? Your top number is above 130. ? Your bottom number is above 80.  Your  personal target blood pressure may vary. Follow these instructions at home: Eating and drinking  If told, follow the DASH eating plan. To follow this plan: ? Fill one half of your plate at each meal with fruits and vegetables. ? Fill one fourth of your plate at each meal with whole grains. Whole grains include whole-wheat pasta, brown rice, and whole-grain bread. ? Eat or drink low-fat dairy products, such as skim milk or low-fat yogurt. ? Fill one fourth of your plate at each meal with low-fat (lean) proteins. Low-fat proteins include fish, chicken without skin, eggs, beans, and tofu. ? Avoid fatty meat, cured and processed meat, or chicken with skin. ? Avoid pre-made or processed food.  Eat less than 1,500 mg of salt each day.  Do not drink alcohol if: ? Your doctor tells you not to drink. ? You are pregnant, may be pregnant, or are  planning to become pregnant.  If you drink alcohol: ? Limit how much you use to:  0-1 drink a day for women.  0-2 drinks a day for men. ? Be aware of how much alcohol is in your drink. In the [place] one drink equals one 12 oz bottle of beer (355 mL), one 5 oz glass of wine (148 mL), or one 1 oz glass of hard liquor (44 mL).   Lifestyle  Work with your doctor to stay at a healthy weight or to lose weight. Ask your doctor what the best weight is for you.  Get at least 30 minutes of exercise most days of the week. This may include walking, swimming, or biking.  Get at least 30 minutes of exercise that strengthens your muscles (resistance exercise) at least 3 days a week. This may include lifting weights or doing Pilates.  Do not use any products that contain nicotine or tobacco, such as cigarettes, e-cigarettes, and chewing tobacco. If you need help quitting, ask your doctor.  Check your blood pressure at home as told by your doctor.  Keep all follow-up visits as told by your doctor. This is important.   Medicines  Take over-the-counter and  prescription medicines only as told by your doctor. Follow directions carefully.  Do not skip doses of blood pressure medicine. The medicine does not work as well if you skip doses. Skipping doses also puts you at risk for problems.  Ask your doctor about side effects or reactions to medicines that you should watch for. Contact a doctor if you:  Think you are having a reaction to the medicine you are taking.  Have headaches that keep coming back (recurring).  Feel dizzy.  Have swelling in your ankles.  Have trouble with your vision. Get help right away if you:  Get a very bad headache.  Start to feel mixed up (confused).  Feel weak or numb.  Feel faint.  Have very bad pain in your: ? Chest. ? Belly (abdomen).  Throw up more than once.  Have trouble breathing. Summary  Hypertension is another name for high blood pressure.  High blood pressure forces your heart to work harder to pump blood.  For most people, a normal blood pressure is less than 120/80.  Making healthy choices can help lower blood pressure. If your blood pressure does not get lower with healthy choices, you may need to take medicine. This information is not intended to replace advice given to you by your health care provider. Make sure you discuss any questions you have with your health care provider. Document Revised: 05/23/2018 Document Reviewed: 05/23/2018 Elsevier Patient Education  2021 [employer].

## 2020-10-20 NOTE — Progress Notes (Signed)
Established Patient Office Visit  Subjective:  Patient ID: Erica Beasley, female    DOB: Apr 16, 1956  Age: 65 y.o. MRN: [id number]  CC:  Chief Complaint  Patient presents with  . Follow-up    Hypertension     HPI Erica Beasley presents for follow up of hypertension. Patient was diagnosed in 09/22/20. The patient is tolerating the medication well without side effects. Compliance with treatment has been fair; including taking medication as directed , maintains a healthy diet and regular exercise regimen , and following up as directed.  Past Medical History:  Diagnosis Date  . COPD (chronic obstructive pulmonary disease) ([place])   . COVID-19 06/2019  . Depression   . Diabetes ([place])   . Hyperlipidemia   . Hypothyroidism   . Neuropathy   . Rheumatoid arthritis [facility])     Past Surgical History:  Procedure Laterality Date  . BIOPSY  09/16/2019   Procedure: BIOPSY;  Surgeon: Daneil Dolin, MD;  [facility];  Service: Endoscopy;;  . BIOPSY  04/28/2020   Procedure: BIOPSY;  Surgeon: Daneil Dolin, MD;  [facility];  Service: Endoscopy;;  . BIOPSY  06/08/2020   Procedure: BIOPSY;  Surgeon: Daneil Dolin, MD;  [facility];  Service: Endoscopy;;  . CATARACT EXTRACTION Bilateral   . COLONOSCOPY  09/2010   Dr. Posey Pronto: mild diverticulsis in sigmoid colon.   . COLONOSCOPY WITH PROPOFOL N/A 09/16/2019   Rourk: Diverticulosis, random colon biopsies negative for microscopic colitis.  . COLONOSCOPY WITH PROPOFOL N/A 06/08/2020   Procedure: COLONOSCOPY WITH PROPOFOL;  Surgeon: Daneil Dolin, MD;  [facility];  Service: Endoscopy;  Laterality: N/A;  9:15am  . ESOPHAGOGASTRODUODENOSCOPY (EGD) WITH PROPOFOL N/A 04/28/2020   Procedure: ESOPHAGOGASTRODUODENOSCOPY (EGD) WITH PROPOFOL;  Surgeon: Daneil Dolin, MD;  [facility];  Service: Endoscopy;  Laterality: N/A;  2:15  . YAG LASER APPLICATION Left 51/76/1607   Procedure: YAG LASER  APPLICATION;  Surgeon: Williams Che, MD;  Location: [facility];  Service: Ophthalmology;  Laterality: Left;    Family History  Problem Relation Age of Onset  . Stroke Mother   . Heart disease Mother   . Diabetes Mother   . Breast cancer Mother   . Other Father        stomach taken out for some reason  . Migraines Daughter   . Colon cancer Neg Hx     Social History   Socioeconomic History  . Marital status: Widowed    Spouse name: Not on file  . Number of children: 1  . Years of education: Not on file  . Highest education level: Not on file  Occupational History  . Occupation: DISABLED  Tobacco Use  . Smoking status: Former Smoker    Packs/day: 1.00    Years: 15.00    Pack years: 15.00    Types: Cigarettes    Quit date: 09/11/2017    Years since quitting: 3.1  . Smokeless tobacco: Former Network engineer  . Vaping Use: Never used  Substance and Sexual Activity  . Alcohol use: Never  . Drug use: Never  . Sexual activity: Not Currently  Other Topics Concern  . Not on file  Social History Narrative  . Not on file   Social Determinants of Health   Financial Resource Strain: Low Risk   . Difficulty of Paying Living Expenses: Not hard at all  Food Insecurity: No Food Insecurity  .  Worried About Charity fundraiser in the Last Year: Never true  . Ran Out of Food in the Last Year: Never true  Transportation Needs: No Transportation Needs  . Lack of Transportation (Medical): No  . Lack of Transportation (Non-Medical): No  Physical Activity: Insufficiently Active  . Days of Exercise per Week: 3 days  . Minutes of Exercise per Session: 30 min  Stress: No Stress Concern Present  . Feeling of Stress : Only a little  Social Connections: Socially Isolated  . Frequency of Communication with Friends and Family: More than three times a week  . Frequency of Social Gatherings with Friends and Family: More than three times a week  . Attends Religious Services: Never  .  Active Member of Clubs or Organizations: No  . Attends Archivist Meetings: Never  . Marital Status: Widowed  Intimate Partner Violence: Not At Risk  . Fear of Current or Ex-Partner: No  . Emotionally Abused: No  . Physically Abused: No  . Sexually Abused: No    Outpatient Medications Prior to Visit  Medication Sig Dispense Refill  . Cholecalciferol (VITAMIN D3) 125 MCG (5000 UT) TABS Take 1 tablet (5,000 Units total) by mouth daily. 30 tablet 3  . citalopram (CELEXA) 40 MG tablet Take 1 tablet (40 mg total) by mouth daily. 30 tablet 0  . ferrous sulfate 325 (65 FE) MG tablet Take 325 mg by mouth daily with breakfast.    . gabapentin (NEURONTIN) 300 MG capsule Take 1 capsule (300 mg total) by mouth 3 (three) times daily. (Patient taking differently: Take 300-600 mg by mouth See admin instructions. Take 300 mg in the morning and 600 mg at night) 90 capsule 3  . hydroxychloroquine (PLAQUENIL) 200 MG tablet Take 200 mg by mouth daily.     Marland Kitchen leflunomide (ARAVA) 20 MG tablet Take 20 mg by mouth daily.    Marland Kitchen levothyroxine (SYNTHROID) 100 MCG tablet Take 1 tablet (100 mcg total) by mouth every morning. 90 tablet 0  . lisinopril (ZESTRIL) 10 MG tablet Take 1 tablet (10 mg total) by mouth daily. 60 tablet 0  . loperamide (IMODIUM A-D) 2 MG tablet Take 1 tablet (2 mg total) by mouth in the morning. Take second dose if needed 30 tablet 0  . Pancrelipase, Lip-Prot-Amyl, (CREON) 24000-76000 units CPEP Take 48,000 units (2 capsules) with meals and 24000 units (1 capsule) with snacks (Patient taking differently: Take 24,000-48,000 Units by mouth See admin instructions. Take 48,000 units (2 capsules) with meals and 24000 units (1 capsule) with snacks) 240 capsule 2  . pantoprazole (PROTONIX) 40 MG tablet TAKE ONE TABLET BY MOUTH DAILY BEFORE BREAKFAST 90 tablet 1  . potassium chloride SA (KLOR-CON) 20 MEQ tablet Take 1 tablet (20 mEq total) by mouth daily. 90 tablet 1  . pravastatin (PRAVACHOL) 40  MG tablet Take 1 tablet (40 mg total) by mouth at bedtime. 30 tablet 0  . predniSONE (DELTASONE) 5 MG tablet Take 5 mg by mouth daily.    . traZODone (DESYREL) 100 MG tablet Take 1 tablet (100 mg total) by mouth at bedtime as needed for sleep. 30 tablet 0   No facility-administered medications prior to visit.    No Known Allergies  ROS Review of Systems  Constitutional: Negative.   HENT: Negative.   Eyes: Negative.   Respiratory: Negative.   Cardiovascular: Negative.   Genitourinary: Negative.   Skin: Negative.   Neurological: Negative for facial asymmetry, light-headedness, numbness and headaches.  All other  systems reviewed and are negative.     Objective:    Physical Exam Vitals reviewed.  Constitutional:      Appearance: Normal appearance.  HENT:     Head: Normocephalic.     Nose: Nose normal.  Eyes:     Conjunctiva/sclera: Conjunctivae normal.  Cardiovascular:     Rate and Rhythm: Normal rate and regular rhythm.  Pulmonary:     Effort: Pulmonary effort is normal.     Breath sounds: Normal breath sounds.  Abdominal:     General: Bowel sounds are normal.  Musculoskeletal:        General: Normal range of motion.  Skin:    General: Skin is warm.  Neurological:     Mental Status: She is alert and oriented to person, place, and time.     BP (!) 142/71   Pulse 74   Temp (!) 97 F (36.1 C)   Ht 5\' 1"  (1.549 m)   Wt 115 lb 3.2 oz (52.3 kg)   SpO2 96%   BMI 21.77 kg/m  Wt Readings from Last 3 Encounters:  10/20/20 115 lb 3.2 oz (52.3 kg)  10/06/20 112 lb 9.6 oz (51.1 kg)  10/05/20 112 lb 6.4 oz (51 kg)     Health Maintenance Due  Topic Date Due  . PAP SMEAR-Modifier  Never done    There are no preventive care reminders to display for this patient.  Lab Results  Component Value Date   TSH 0.009 (L) 05/07/2020   Lab Results  Component Value Date   WBC 5.6 09/30/2020   HGB 9.9 (L) 09/30/2020   HCT 33.5 (L) 09/30/2020   MCV 104.4 (H) 09/30/2020    PLT 301 09/30/2020   Lab Results  Component Value Date   NA 138 09/30/2020   K 3.5 09/30/2020   CO2 25 09/30/2020   GLUCOSE 185 (H) 09/30/2020   BUN 13 09/30/2020   CREATININE 0.78 09/30/2020   BILITOT 0.4 09/30/2020   ALKPHOS 85 09/30/2020   AST 55 (H) 09/30/2020   ALT 27 09/30/2020   PROT 6.9 09/30/2020   ALBUMIN 3.7 09/30/2020   CALCIUM 8.2 (L) 09/30/2020   ANIONGAP 8 09/30/2020   Lab Results  Component Value Date   CHOL 110 05/07/2020   Lab Results  Component Value Date   HDL 42 05/07/2020   Lab Results  Component Value Date   LDLCALC 50 05/07/2020   Lab Results  Component Value Date   TRIG 92 05/07/2020   Lab Results  Component Value Date   CHOLHDL 2.6 05/07/2020   Lab Results  Component Value Date   HGBA1C 7.0 (H) 03/10/2020      Assessment & Plan:   Problem List Items Addressed This Visit      Cardiovascular and Mediastinum   Essential hypertension    Continue on current dose, advice patient to take blood pressure medication as directed, continue to keep a blood pressure log, low salt diet recommended and exercise as tolerated.  Follow-up with worsening or unresolved symptoms.         Follow-up: No follow-ups on file.    Ivy Lynn, NP

## 2020-10-27 ENCOUNTER — Telehealth: Payer: Self-pay

## 2020-10-27 DIAGNOSIS — F339 Major depressive disorder, recurrent, unspecified: Secondary | ICD-10-CM

## 2020-10-27 MED ORDER — CITALOPRAM HYDROBROMIDE 40 MG PO TABS
40.0000 mg | ORAL_TABLET | Freq: Every day | ORAL | 0 refills | Status: DC
Start: 2020-10-27 — End: 2020-11-13

## 2020-10-27 MED ORDER — GABAPENTIN 300 MG PO CAPS
300.0000 mg | ORAL_CAPSULE | Freq: Three times a day (TID) | ORAL | 0 refills | Status: DC
Start: 2020-10-27 — End: 2020-11-13

## 2020-10-27 NOTE — Telephone Encounter (Signed)
  Prescription Request  10/27/2020  What is the name of the medication or equipment? Citaopram 40 mg, Gabapentin 40 mg  Have you contacted your pharmacy to request a refill? (if applicable) yes  Which pharmacy would you like this sent to? [facility]   Patient notified that their request is being sent to the clinical staff for review and that they should receive a response within 2 business days.

## 2020-10-27 NOTE — Telephone Encounter (Signed)
Refills sent to [facility] 

## 2020-11-02 ENCOUNTER — Other Ambulatory Visit (HOSPITAL_COMMUNITY): Payer: Self-pay | Admitting: Surgery

## 2020-11-02 DIAGNOSIS — D472 Monoclonal gammopathy: Secondary | ICD-10-CM

## 2020-11-02 DIAGNOSIS — E876 Hypokalemia: Secondary | ICD-10-CM

## 2020-11-02 DIAGNOSIS — D649 Anemia, unspecified: Secondary | ICD-10-CM

## 2020-11-02 MED ORDER — CYANOCOBALAMIN 1000 MCG/ML IJ SOLN
1000.0000 ug | INTRAMUSCULAR | 0 refills | Status: AC
Start: 2020-11-02 — End: 2021-04-02

## 2020-11-02 NOTE — Progress Notes (Signed)
cyan 

## 2020-11-05 ENCOUNTER — Ambulatory Visit (INDEPENDENT_AMBULATORY_CARE_PROVIDER_SITE_OTHER): Payer: 59 | Admitting: Nurse Practitioner

## 2020-11-05 ENCOUNTER — Encounter: Payer: Self-pay | Admitting: Nurse Practitioner

## 2020-11-05 ENCOUNTER — Other Ambulatory Visit: Payer: Self-pay

## 2020-11-05 VITALS — BP 155/93 | HR 77 | Temp 96.2°F | Ht 61.0 in | Wt 115.8 lb

## 2020-11-05 DIAGNOSIS — K529 Noninfective gastroenteritis and colitis, unspecified: Secondary | ICD-10-CM | POA: Diagnosis not present

## 2020-11-05 DIAGNOSIS — R112 Nausea with vomiting, unspecified: Secondary | ICD-10-CM | POA: Diagnosis not present

## 2020-11-05 DIAGNOSIS — K219 Gastro-esophageal reflux disease without esophagitis: Secondary | ICD-10-CM

## 2020-11-05 DIAGNOSIS — R634 Abnormal weight loss: Secondary | ICD-10-CM | POA: Diagnosis not present

## 2020-11-05 NOTE — Patient Instructions (Signed)
Your health issues we discussed today were:   Diarrhea and weight loss: 1. I am glad you are doing better! 2. Continue taking your current medications 3. Let us know if your diarrhea flares up or becomes worse again 4. Continue to try to eat healthy, nutritious foods 5. Monitor your weight let us know if you notice any significant, reoccurring weight loss  Overall I recommend:  1. Continue your other current medications 2. Return for follow-up in 6 months 3. Call us for any questions or concerns   ---------------------------------------------------------------  I am glad you have gotten your COVID-19 vaccination!  Even though you are fully vaccinated you should continue to follow CDC and state/local guidelines.  ---------------------------------------------------------------   At [facility] we value your feedback. You may receive a survey about your visit today. Please share your experience as we strive to create trusting relationships with our patients to provide genuine, compassionate, quality care.  We appreciate your understanding and patience as we review any laboratory studies, imaging, and other diagnostic tests that are ordered as we care for you. Our office policy is 5 business days for review of these results, and any emergent or urgent results are addressed in a timely manner for your best interest. If you do not hear from our office in 1 week, please contact us.   We also encourage the use of MyChart, which contains your medical information for your review as well. If you are not enrolled in this feature, an access code is on this after visit summary for your convenience. Thank you for allowing [place] to be involved in your care.  It was great to see you today!  I hope you have a safe and warm winter!!

## 2020-11-05 NOTE — Progress Notes (Signed)
Referring Provider: Loman Brooklyn, FNP Primary Care Physician:  Loman Brooklyn, FNP Primary GI:  Dr. Gala Romney  Chief Complaint  Patient presents with  . Diarrhea    Doing better, has episode every 1-2 weeks  . Weight Loss    Has gained 12 lbs since 07/2020. Appetite has come back    HPI:   Erica Beasley is a 65 y.o. female who presents for follow-up on diarrhea and weight loss.  The patient was last seen in our office 08/05/20 for chronic diarrhea, nausea/vomiting, abnormal CT of the abdomen, weight loss.  Noted history of rheumatoid arthritis, COPD, diabetes, prior courses he did require multiple rounds of vancomycin and ultimately Dificid that improved in August 2020 but not quite back to baseline with stools.  Colonoscopy up-to-date 2020 with diverticulosis and random colon biopsies negative for microscopic colitis and recommended repeat in 5 years (2025).  Repeat C. difficile testing in December 2020 - but started on empiric Creon because of fatty nature of her stools.  Some weight loss noted previously of about 30 pounds.  EGD updated April 28, 2020 essentially normal with gastric and duodenal biopsies found to be antral and oxyntic mucosa with slight chronic inflammation but negative for H. Pylori.  CT of the chest, abdomen, pelvis in August of 2021 included long segment colonic thickening likely diverticular with possible low-level inflammation and correlation with recent colonoscopy likely helpful.  Benign nodule in the lung based on stability over 1 year.  Question need for repeat colonoscopy which was last completed in 2020.  Her colonoscopy was updated June 08, 2020 with mild pancolonic diverticulosis normal feeling colonic mucosa otherwise status post segmental biopsies and stool sampling which found no significant histopathological changes and GI path panel negative.  Message from family indicating persistent diarrhea despite stopping Plaquenil.  Doubt biliary issue,  more likely functional bowel issues such as IBS.  Recommended Zofran ODT as needed, Bentyl twice a day for diarrhea, Imodium as needed.  Nurse weight check on June 25, 2020 with increased weight from 99 pounds 101 pounds.  At her last visit diarrhea several times a week, diet dependent.  Seems to be trying to form signed.  Increased appetite and subsequently gaining weight.  Has not needed as much Imodium recently.  Still taking Creon, no need for Bentyl recently.  Recommended continue current regimen including Creon with meals/snacks, Bentyl as needed, Imodium as needed.  Call for worsening stools, add probiotic for 1 to 2 months, follow-up in 3 months.  Today states doing okay overall.  Her diarrhea is doing better and generally has an episode every 1 to 2 weeks.  She has gained 12 pounds since November 2021 and her appetite is doing well. Hasn't needed Bentyl recently. Uses Imodium if she does have an episode of diarrhea. She did not take the probiotic. Still on Creon, ok with taking this ongoing. Still taking Protonix and notes no GERD symptoms. Denies abdominal pain, N/V, hematochezia, melena (though does have darker stools on iron), fever, chills, unintentional weight loss. Denies URI or flu-like symptoms. Denies loss of sense of taste or smell. The patient has received COVID-19 vaccination(s). They only had one dose, no second dose. Denies chest pain, dyspnea, dizziness, lightheadedness, syncope, near syncope. Denies any other upper or lower GI symptoms.  Past Medical History:  Diagnosis Date  . COPD (chronic obstructive pulmonary disease) ([place])   . COVID-19 06/2019  . Depression   . Diabetes ([place])   . Hyperlipidemia   .  Hypothyroidism   . Neuropathy   . Rheumatoid arthritis [facility])     Past Surgical History:  Procedure Laterality Date  . BIOPSY  09/16/2019   Procedure: BIOPSY;  Surgeon: Daneil Dolin, MD;  Location: [facility];  Service: Endoscopy;;  . BIOPSY  04/28/2020    Procedure: BIOPSY;  Surgeon: Daneil Dolin, MD;  [facility];  Service: Endoscopy;;  . BIOPSY  06/08/2020   Procedure: BIOPSY;  Surgeon: Daneil Dolin, MD;  [facility]: [facility];  Service: Endoscopy;;  . CATARACT EXTRACTION Bilateral   . COLONOSCOPY  09/2010   Dr. Posey Pronto: mild diverticulsis in sigmoid colon.   . COLONOSCOPY WITH PROPOFOL N/A 09/16/2019   Rourk: Diverticulosis, random colon biopsies negative for microscopic colitis.  . COLONOSCOPY WITH PROPOFOL N/A 06/08/2020   Procedure: COLONOSCOPY WITH PROPOFOL;  Surgeon: Daneil Dolin, MD;  [facility]: [facility];  Service: Endoscopy;  Laterality: N/A;  9:15am  . ESOPHAGOGASTRODUODENOSCOPY (EGD) WITH PROPOFOL N/A 04/28/2020   Procedure: ESOPHAGOGASTRODUODENOSCOPY (EGD) WITH PROPOFOL;  Surgeon: Daneil Dolin, MD;  [facility]: [facility];  Service: Endoscopy;  Laterality: N/A;  2:15  . YAG LASER APPLICATION Left 25/85/2778   Procedure: YAG LASER APPLICATION;  Surgeon: Williams Che, MD;  Location: [facility];  Service: Ophthalmology;  Laterality: Left;    Current Outpatient Medications  Medication Sig Dispense Refill  . Cholecalciferol (VITAMIN D3) 125 MCG (5000 UT) TABS Take 1 tablet (5,000 Units total) by mouth daily. 30 tablet 3  . citalopram (CELEXA) 40 MG tablet Take 1 tablet (40 mg total) by mouth daily. 30 tablet 0  . cyanocobalamin (,VITAMIN B-12,) 1000 MCG/ML injection Inject 1 mL (1,000 mcg total) into the muscle every 30 (thirty) days for 6 doses. 6 mL 0  . ferrous sulfate 325 (65 FE) MG tablet Take 325 mg by mouth daily with breakfast.    . gabapentin (NEURONTIN) 300 MG capsule Take 1 capsule (300 mg total) by mouth 3 (three) times daily. 90 capsule 0  . hydroxychloroquine (PLAQUENIL) 200 MG tablet Take 200 mg by mouth daily.     Marland Kitchen leflunomide (ARAVA) 20 MG tablet Take 20 mg by mouth daily.    Marland Kitchen levothyroxine (SYNTHROID) 100 MCG tablet Take 1 tablet (100 mcg total) by mouth every morning. 90 tablet 0  .  lisinopril (ZESTRIL) 10 MG tablet Take 1 tablet (10 mg total) by mouth daily. 60 tablet 0  . loperamide (IMODIUM A-D) 2 MG tablet Take 1 tablet (2 mg total) by mouth in the morning. Take second dose if needed (Patient taking differently: Take 2 mg by mouth as needed. Take second dose if needed) 30 tablet 0  . Pancrelipase, Lip-Prot-Amyl, (CREON) 24000-76000 units CPEP Take 48,000 units (2 capsules) with meals and 24000 units (1 capsule) with snacks (Patient taking differently: Take 24,000-48,000 Units by mouth See admin instructions. Take 48,000 units (2 capsules) with meals and 24000 units (1 capsule) with snacks) 240 capsule 2  . pantoprazole (PROTONIX) 40 MG tablet TAKE ONE TABLET BY MOUTH DAILY BEFORE BREAKFAST 90 tablet 1  . potassium chloride SA (KLOR-CON) 20 MEQ tablet Take 1 tablet (20 mEq total) by mouth daily. 90 tablet 1  . pravastatin (PRAVACHOL) 40 MG tablet Take 1 tablet (40 mg total) by mouth at bedtime. 30 tablet 0  . predniSONE (DELTASONE) 5 MG tablet Take 5 mg by mouth daily.    . traZODone (DESYREL) 100 MG tablet Take 1 tablet (100 mg total) by mouth at bedtime  as needed for sleep. 30 tablet 0   No current facility-administered medications for this visit.    Allergies as of 11/05/2020  . (No Known Allergies)    Family History  Problem Relation Age of Onset  . Stroke Mother   . Heart disease Mother   . Diabetes Mother   . Breast cancer Mother   . Other Father        stomach taken out for some reason  . Migraines Daughter   . Colon cancer Neg Hx     Social History   Socioeconomic History  . Marital status: Widowed    Spouse name: Not on file  . Number of children: 1  . Years of education: Not on file  . Highest education level: Not on file  Occupational History  . Occupation: DISABLED  Tobacco Use  . Smoking status: Former Smoker    Packs/day: 1.00    Years: 15.00    Pack years: 15.00    Types: Cigarettes    Quit date: 09/11/2017    Years since quitting:  3.1  . Smokeless tobacco: Former Network engineer  . Vaping Use: Never used  Substance and Sexual Activity  . Alcohol use: Never  . Drug use: Never  . Sexual activity: Not Currently  Other Topics Concern  . Not on file  Social History Narrative  . Not on file   Social Determinants of Health   Financial Resource Strain: Low Risk   . Difficulty of Paying Living Expenses: Not hard at all  Food Insecurity: No Food Insecurity  . Worried About Charity fundraiser in the Last Year: Never true  . Ran Out of Food in the Last Year: Never true  Transportation Needs: No Transportation Needs  . Lack of Transportation (Medical): No  . Lack of Transportation (Non-Medical): No  Physical Activity: Insufficiently Active  . Days of Exercise per Week: 3 days  . Minutes of Exercise per Session: 30 min  Stress: No Stress Concern Present  . Feeling of Stress : Only a little  Social Connections: Socially Isolated  . Frequency of Communication with Friends and Family: More than three times a week  . Frequency of Social Gatherings with Friends and Family: More than three times a week  . Attends Religious Services: Never  . Active Member of Clubs or Organizations: No  . Attends Archivist Meetings: Never  . Marital Status: Widowed    Subjective:  Review of Systems  Constitutional: Negative for chills, fever, malaise/fatigue and weight loss.  HENT: Negative for congestion and sore throat.   Respiratory: Negative for cough and shortness of breath.   Cardiovascular: Negative for chest pain and palpitations.  Gastrointestinal: Positive for vomiting (significantly improved). Negative for abdominal pain, blood in stool, constipation, diarrhea, heartburn, melena and nausea.  Musculoskeletal: Negative for joint pain and myalgias.  Skin: Negative for rash.  Neurological: Negative for dizziness and weakness.  Endo/Heme/Allergies: Does not bruise/bleed easily.  Psychiatric/Behavioral: Negative  for depression. The patient is not nervous/anxious.   All other systems reviewed and are negative.    Objective: BP (!) 155/93   Pulse 77   Temp (!) 96.2 F (35.7 C) (Temporal)   Ht 5\' 1"  (1.549 m)   Wt 115 lb 12.8 oz (52.5 kg)   BMI 21.88 kg/m  Physical Exam Vitals and nursing note reviewed.  Constitutional:      General: She is not in acute distress.    Appearance: Normal appearance. She is  well-developed and normal weight. She is not ill-appearing, toxic-appearing or diaphoretic.  HENT:     Head: Normocephalic and atraumatic.     Nose: No congestion or rhinorrhea.  Eyes:     General: No scleral icterus. Cardiovascular:     Rate and Rhythm: Normal rate and regular rhythm.     Heart sounds: Normal heart sounds.  Pulmonary:     Effort: Pulmonary effort is normal. No respiratory distress.     Breath sounds: Normal breath sounds.  Abdominal:     General: Bowel sounds are normal.     Palpations: Abdomen is soft. There is no hepatomegaly, splenomegaly or mass.     Tenderness: There is no abdominal tenderness. There is no guarding or rebound.     Hernia: No hernia is present.  Skin:    General: Skin is warm and dry.     Coloration: Skin is not jaundiced.     Findings: No rash.  Neurological:     General: No focal deficit present.     Mental Status: She is alert and oriented to person, place, and time.  Psychiatric:        Attention and Perception: Attention normal.        Mood and Affect: Mood normal.        Speech: Speech normal.        Behavior: Behavior normal.        Thought Content: Thought content normal.        Cognition and Memory: Cognition and memory normal.      Assessment:  Very pleasant 65 year old female presents for follow-up on diarrhea and weight loss.  Previously it was felt her diarrhea was due to a functional problem such as IBS.  Query possible pancreatic insufficiency.  Previous abnormal CT with colon wall thickening with follow-up colonoscopy  reassuring.  No other specific cause for diarrhea has been identified.  We have made multiple adjustments recently including addition of Creon, probiotic, Bentyl, Imodium.  Up until now she seemed to be doing incrementally better.  Today she is actually doing quite well.  She is only having loose stools maybe every 1 to 2 weeks (previously multiple times a day).  She is still taking Creon, Imodium as needed.  Has not needed Bentyl in some time.  She never took her probiotic.  At this point given her improvement in symptoms I recommended she continue her current medications.  Continue Creon cannot "rock the boat" we can consider if this is truly needed at her follow-up.  As an aside, she remains on Protonix and her GERD is also doing well.  Recommend she continue this as well.   Plan: 1. Continue current medications 2. Return for follow-up in 6 months 3. Call us for any worsening in the interim.    Thank you for allowing [place] to participate in the care of Erica de los [place]amos, DNP, AGNP-C Adult & Gerontological Nurse Practitioner [facility]   11/05/2020 11:47 AM   Disclaimer: This note was dictated with voice recognition software. Similar sounding words can inadvertently be transcribed and may not be corrected upon review.

## 2020-11-13 ENCOUNTER — Other Ambulatory Visit: Payer: Self-pay

## 2020-11-13 ENCOUNTER — Ambulatory Visit (INDEPENDENT_AMBULATORY_CARE_PROVIDER_SITE_OTHER): Payer: 59 | Admitting: Family Medicine

## 2020-11-13 ENCOUNTER — Encounter: Payer: Self-pay | Admitting: Family Medicine

## 2020-11-13 VITALS — BP 111/70 | HR 78 | Temp 96.6°F | Wt 116.8 lb

## 2020-11-13 DIAGNOSIS — K219 Gastro-esophageal reflux disease without esophagitis: Secondary | ICD-10-CM

## 2020-11-13 DIAGNOSIS — E1165 Type 2 diabetes mellitus with hyperglycemia: Secondary | ICD-10-CM

## 2020-11-13 DIAGNOSIS — M069 Rheumatoid arthritis, unspecified: Secondary | ICD-10-CM

## 2020-11-13 DIAGNOSIS — N08 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: Secondary | ICD-10-CM

## 2020-11-13 DIAGNOSIS — I10 Essential (primary) hypertension: Secondary | ICD-10-CM

## 2020-11-13 DIAGNOSIS — E039 Hypothyroidism, unspecified: Secondary | ICD-10-CM

## 2020-11-13 DIAGNOSIS — E538 Deficiency of other specified B group vitamins: Secondary | ICD-10-CM

## 2020-11-13 DIAGNOSIS — D649 Anemia, unspecified: Secondary | ICD-10-CM

## 2020-11-13 DIAGNOSIS — G629 Polyneuropathy, unspecified: Secondary | ICD-10-CM

## 2020-11-13 DIAGNOSIS — E559 Vitamin D deficiency, unspecified: Secondary | ICD-10-CM | POA: Insufficient documentation

## 2020-11-13 DIAGNOSIS — E782 Mixed hyperlipidemia: Secondary | ICD-10-CM

## 2020-11-13 DIAGNOSIS — J449 Chronic obstructive pulmonary disease, unspecified: Secondary | ICD-10-CM

## 2020-11-13 DIAGNOSIS — R7303 Prediabetes: Secondary | ICD-10-CM | POA: Insufficient documentation

## 2020-11-13 DIAGNOSIS — E876 Hypokalemia: Secondary | ICD-10-CM

## 2020-11-13 DIAGNOSIS — D472 Monoclonal gammopathy: Secondary | ICD-10-CM

## 2020-11-13 DIAGNOSIS — K529 Noninfective gastroenteritis and colitis, unspecified: Secondary | ICD-10-CM

## 2020-11-13 DIAGNOSIS — I700 Atherosclerosis of aorta: Secondary | ICD-10-CM

## 2020-11-13 DIAGNOSIS — F339 Major depressive disorder, recurrent, unspecified: Secondary | ICD-10-CM

## 2020-11-13 HISTORY — DX: Atherosclerosis of aorta: I70.0

## 2020-11-13 LAB — BAYER DCA HB A1C WAIVED: HB A1C (BAYER DCA - WAIVED): 6.4 % (ref <7.0)

## 2020-11-13 MED ORDER — PANTOPRAZOLE SODIUM 40 MG PO TBEC
DELAYED_RELEASE_TABLET | ORAL | 1 refills | Status: DC
Start: 2020-11-13 — End: 2021-09-29

## 2020-11-13 MED ORDER — TRAZODONE HCL 100 MG PO TABS: 100.0000 mg | ORAL_TABLET | Freq: Every evening | ORAL | 1 refills | Status: DC | PRN

## 2020-11-13 MED ORDER — GABAPENTIN 300 MG PO CAPS: 300.0000 mg | ORAL_CAPSULE | Freq: Three times a day (TID) | ORAL | 5 refills | Status: DC

## 2020-11-13 MED ORDER — PRAVASTATIN SODIUM 40 MG PO TABS
40.0000 mg | ORAL_TABLET | Freq: Every day | ORAL | 1 refills | Status: DC
Start: 2020-11-13 — End: 2021-05-17

## 2020-11-13 MED ORDER — CITALOPRAM HYDROBROMIDE 40 MG PO TABS: 40.0000 mg | ORAL_TABLET | Freq: Every day | ORAL | 1 refills | Status: DC

## 2020-11-13 MED ORDER — ALBUTEROL SULFATE HFA 108 (90 BASE) MCG/ACT IN AERS
2.0000 | INHALATION_SPRAY | Freq: Four times a day (QID) | RESPIRATORY_TRACT | 2 refills | Status: DC | PRN
Start: 2020-11-13 — End: 2021-06-22

## 2020-11-13 MED ORDER — POTASSIUM CHLORIDE CRYS ER 20 MEQ PO TBCR: 20.0000 meq | EXTENDED_RELEASE_TABLET | Freq: Every day | ORAL | 1 refills | Status: DC

## 2020-11-13 MED ORDER — LISINOPRIL 10 MG PO TABS: 10.0000 mg | ORAL_TABLET | Freq: Every day | ORAL | 1 refills | Status: DC

## 2020-11-13 NOTE — Patient Instructions (Signed)
What are the signs or symptoms? Symptoms of this condition include:  Nervousness.  Inability to tolerate heat.  Unexplained weight loss.  Diarrhea.  Change in the texture of hair or skin.  Heart skipping beats or making extra beats.  Rapid heart rate.  Loss of menstruation.  Shaky hands.  Fatigue.  Restlessness.  Sleep problems.  Enlarged thyroid gland or a lump in the thyroid (nodule).

## 2020-11-13 NOTE — Progress Notes (Signed)
Assessment & Plan:  1. Depression, recurrent ([place]) Well controlled on current regimen.  - citalopram (CELEXA) 40 MG tablet; Take 1 tablet (40 mg total) by mouth daily.  Dispense: 90 tablet; Refill: 1 - traZODone (DESYREL) 100 MG tablet; Take 1 tablet (100 mg total) by mouth at bedtime as needed for sleep.  Dispense: 90 tablet; Refill: 1 - CMP14+EGFR  2. Hypothyroidism, unspecified type Likely uncontrolled. Labs to assess. - Thyroid Panel With TSH - CMP14+EGFR  3. Essential hypertension Well controlled on current regimen.  - lisinopril (ZESTRIL) 10 MG tablet; Take 1 tablet (10 mg total) by mouth daily.  Dispense: 90 tablet; Refill: 1 - Lipid panel - CMP14+EGFR  4. Hypokalemia Well controlled on current regimen.  - potassium chloride SA (KLOR-CON) 20 MEQ tablet; Take 1 tablet (20 mEq total) by mouth daily.  Dispense: 90 tablet; Refill: 1 - CMP14+EGFR  5. Type 2 diabetes mellitus with hyperglycemia, without long-term current use of insulin (HCC) Labs to assess. - lisinopril (ZESTRIL) 10 MG tablet; Take 1 tablet (10 mg total) by mouth daily.  Dispense: 90 tablet; Refill: 1 - pravastatin (PRAVACHOL) 40 MG tablet; Take 1 tablet (40 mg total) by mouth at bedtime.  Dispense: 90 tablet; Refill: 1 - Bayer DCA Hb A1c Waived - Lipid panel - CMP14+EGFR - CBC with Differential/Platelet  6. Mixed hyperlipidemia Labs to assess.  - pravastatin (PRAVACHOL) 40 MG tablet; Take 1 tablet (40 mg total) by mouth at bedtime.  Dispense: 90 tablet; Refill: 1 - Lipid panel - CMP14+EGFR  7. Aortic atherosclerosis (HCC) On statin. - pravastatin (PRAVACHOL) 40 MG tablet; Take 1 tablet (40 mg total) by mouth at bedtime.  Dispense: 90 tablet; Refill: 1 - Lipid panel - CMP14+EGFR  8. Monoclonal gammopathy Managed by oncology. - CBC with Differential/Platelet  9. Normocytic anemia Managed by oncology. Taking ferrous sulfate. - CBC with Differential/Platelet  10. B12 deficiency Managed by  oncology. Getting B12 injections monthly.  - Vitamin B12  11. Vitamin D deficiency Well controlled on current regimen. Taking vitamin D supplement.  12. Chronic obstructive pulmonary disease, unspecified COPD type ([place]) Uncontrolled. Rx'd Albuterol. - albuterol (VENTOLIN HFA) 108 (90 Base) MCG/ACT inhaler; Inhale 2 puffs into the lungs every 6 (six) hours as needed for wheezing or shortness of breath.  Dispense: 18 g; Refill: 2  13. Gastroesophageal reflux disease, unspecified whether esophagitis present Well controlled on current regimen. Managed by gastroenterology. - pantoprazole (PROTONIX) 40 MG tablet; TAKE ONE TABLET BY MOUTH DAILY BEFORE BREAKFAST  Dispense: 90 tablet; Refill: 1 - CMP14+EGFR  14. Chronic diarrhea Well controlled on current regimen. Managed by gastroenterology. - CMP14+EGFR  15. 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 - CMP14+EGFR  16. Neuropathy Well controlled on current regimen.  - gabapentin (NEURONTIN) 300 MG capsule; Take 1 capsule (300 mg total) by mouth 3 (three) times daily.  Dispense: 90 capsule; Refill: 5 - CMP14+EGFR  17. Rheumatoid arthritis involving multiple sites, unspecified whether rheumatoid factor present [facility]) Managed by rheumatologist.  - CMP14+EGFR - CBC with Differential/Platelet   Return as directed after labs result.  Erica Limes, MSN, APRN, FNP-C [facility]  Subjective:    Patient ID: Erica Beasley, female    DOB: Mar 14, 1956, 65 y.o.   MRN: [id number]  Patient Care Team: Loman Brooklyn, FNP as PCP - General (Family Medicine) Gala Romney Cristopher Estimable, MD as Consulting Physician (Gastroenterology)   Chief Complaint:  Chief Complaint  Patient presents with  . Depression  . Hypertension  Check up of chronic medical conditions      HPI: Erica Beasley is a 65 y.o. female presenting on 11/13/2020 for Depression and Hypertension (Check up of chronic medical  conditions/ )  Depression: Doing well with Celexa and trazodone.  Depression screen [facility] 2/9 11/13/2020 10/20/2020 10/05/2020  Decreased Interest 0 0 0  Down, Depressed, Hopeless 0 0 0  PHQ - 2 Score 0 0 0  Altered sleeping 0 - -  Tired, decreased energy 3 - -  Change in appetite 0 - -  Feeling bad or failure about yourself  0 - -  Trouble concentrating 0 - -  Moving slowly or fidgety/restless 0 - -  Suicidal thoughts 0 - -  PHQ-9 Score 3 - -  Difficult doing work/chores Not difficult at all - -   GAD 7 : Generalized Anxiety Score 11/13/2020 09/22/2020  Nervous, Anxious, on Edge 0 0  Control/stop worrying 0 0  Worry too much - different things 0 0  Trouble relaxing 0 0  Restless 0 0  Easily annoyed or irritable 0 0  Afraid - awful might happen 0 0  Total GAD 7 Score 0 0  Anxiety Difficulty Not difficult at all Not difficult at all    Vitamin D deficiency: Taking a supplement once daily.  Vitamin B12 deficiency: Managed by the oncologist.  She is getting B12 injections monthly.  Anemia: Taking ferrous sulfate.  Managed by oncology.  Neuropathy: Controlled with gabapentin.  Rheumatoid arthritis: Patient goes to [facility] rheumatology.  She is currently taking Plaquenil, prednisone, and Arava.  She states that they are going to start her on infusions.  Hypothyroidism: Patient's TSH was low back in August at which time her dosage was decreased from 100 mcg to 75 mcg.  Patient was unaware of this change.  Her dosage was changed back to 100 mcg a few days later when she went to see her oncologist; I suspect because she told him that was the dose that she was taking.  Patient has been having chronic diarrhea for which she has been going to the gastroenterologist for.  It is currently controlled with Imodium A-D and Creon with meals and snacks.  Hypertension: Controlled with lisinopril.  Patient has been eating healthy.  She walks for exercise.  GERD: Managed by gastroenterologist.   Controlled with Protonix.  Hyperlipidemia: Doing well with pravastatin.  Hypokalemia: Doing well with potassium.  COPD: Patient does not have any inhalers including an albuterol inhaler.  She does report shortness of breath and states it is hard to walk to the mailbox and back.  Diabetes: Patient reports she has not had any diabetic medication since April of last year.  She has been eating healthy since October.  She walks for exercise.  She does not check her blood sugar at home.  She is on an ACE inhibitor and a statin.  CKD: Managed by Dr. Theador Hawthorne, nephrologist.  New complaints: None  Social history:  Relevant past medical, surgical, family and social history reviewed and updated as indicated. Interim medical history since our last visit reviewed.  Allergies and medications reviewed and updated.  DATA REVIEWED: CHART IN EPIC  ROS: Negative unless specifically indicated above in HPI.    Current Outpatient Medications:  .  Cholecalciferol (VITAMIN D3) 125 MCG (5000 UT) TABS, Take 1 tablet (5,000 Units total) by mouth daily., Disp: 30 tablet, Rfl: 3 .  citalopram (CELEXA) 40 MG tablet, Take 1 tablet (40 mg total) by mouth daily., Disp: 30  tablet, Rfl: 0 .  cyanocobalamin (,VITAMIN B-12,) 1000 MCG/ML injection, Inject 1 mL (1,000 mcg total) into the muscle every 30 (thirty) days for 6 doses., Disp: 6 mL, Rfl: 0 .  ferrous sulfate 325 (65 FE) MG tablet, Take 325 mg by mouth daily with breakfast., Disp: , Rfl:  .  gabapentin (NEURONTIN) 300 MG capsule, Take 1 capsule (300 mg total) by mouth 3 (three) times daily., Disp: 90 capsule, Rfl: 0 .  hydroxychloroquine (PLAQUENIL) 200 MG tablet, Take 200 mg by mouth daily. , Disp: , Rfl:  .  leflunomide (ARAVA) 20 MG tablet, Take 20 mg by mouth daily., Disp: , Rfl:  .  levothyroxine (SYNTHROID) 100 MCG tablet, Take 1 tablet (100 mcg total) by mouth every morning., Disp: 90 tablet, Rfl: 0 .  lisinopril (ZESTRIL) 10 MG tablet, Take 1 tablet (10  mg total) by mouth daily., Disp: 60 tablet, Rfl: 0 .  loperamide (IMODIUM A-D) 2 MG tablet, Take 1 tablet (2 mg total) by mouth in the morning. Take second dose if needed (Patient taking differently: Take 2 mg by mouth as needed. Take second dose if needed), Disp: 30 tablet, Rfl: 0 .  Pancrelipase, Lip-Prot-Amyl, (CREON) 24000-76000 units CPEP, Take 48,000 units (2 capsules) with meals and 24000 units (1 capsule) with snacks (Patient taking differently: Take 24,000-48,000 Units by mouth See admin instructions. Take 48,000 units (2 capsules) with meals and 24000 units (1 capsule) with snacks), Disp: 240 capsule, Rfl: 2 .  pantoprazole (PROTONIX) 40 MG tablet, TAKE ONE TABLET BY MOUTH DAILY BEFORE BREAKFAST, Disp: 90 tablet, Rfl: 1 .  potassium chloride SA (KLOR-CON) 20 MEQ tablet, Take 1 tablet (20 mEq total) by mouth daily., Disp: 90 tablet, Rfl: 1 .  pravastatin (PRAVACHOL) 40 MG tablet, Take 1 tablet (40 mg total) by mouth at bedtime., Disp: 30 tablet, Rfl: 0 .  predniSONE (DELTASONE) 5 MG tablet, Take 5 mg by mouth daily., Disp: , Rfl:  .  traZODone (DESYREL) 100 MG tablet, Take 1 tablet (100 mg total) by mouth at bedtime as needed for sleep., Disp: 30 tablet, Rfl: 0   No Known Allergies Past Medical History:  Diagnosis Date  . COPD (chronic obstructive pulmonary disease) ([place])   . COVID-19 06/2019  . Depression   . Diabetes ([place])   . Hyperlipidemia   . Hypothyroidism   . Neuropathy   . Rheumatoid arthritis [facility])     Past Surgical History:  Procedure Laterality Date  . BIOPSY  09/16/2019   Procedure: BIOPSY;  Surgeon: Daneil Dolin, MD;  Location: [facility];  Service: Endoscopy;;  . BIOPSY  04/28/2020   Procedure: BIOPSY;  Surgeon: Daneil Dolin, MD;  [facility];  Service: Endoscopy;;  . BIOPSY  06/08/2020   Procedure: BIOPSY;  Surgeon: Daneil Dolin, MD;  [facility];  Service: Endoscopy;;  . CATARACT EXTRACTION Bilateral   . COLONOSCOPY  09/2010    Dr. Posey Pronto: mild diverticulsis in sigmoid colon.   . COLONOSCOPY WITH PROPOFOL N/A 09/16/2019   Rourk: Diverticulosis, random colon biopsies negative for microscopic colitis.  . COLONOSCOPY WITH PROPOFOL N/A 06/08/2020   Procedure: COLONOSCOPY WITH PROPOFOL;  Surgeon: Daneil Dolin, MD;  [facility];  Service: Endoscopy;  Laterality: N/A;  9:15am  . ESOPHAGOGASTRODUODENOSCOPY (EGD) WITH PROPOFOL N/A 04/28/2020   Procedure: ESOPHAGOGASTRODUODENOSCOPY (EGD) WITH PROPOFOL;  Surgeon: Daneil Dolin, MD;  [facility];  Service: Endoscopy;  Laterality: N/A;  2:15  . YAG LASER  APPLICATION Left 94/70/9628   Procedure: YAG LASER APPLICATION;  Surgeon: Williams Che, MD;  Location: [facility];  Service: Ophthalmology;  Laterality: Left;    Social History   Socioeconomic History  . Marital status: Widowed    Spouse name: Not on file  . Number of children: 1  . Years of education: Not on file  . Highest education level: Not on file  Occupational History  . Occupation: DISABLED  Tobacco Use  . Smoking status: Former Smoker    Packs/day: 1.00    Years: 15.00    Pack years: 15.00    Types: Cigarettes    Quit date: 09/11/2017    Years since quitting: 3.1  . Smokeless tobacco: Former Network engineer  . Vaping Use: Never used  Substance and Sexual Activity  . Alcohol use: Never  . Drug use: Never  . Sexual activity: Not Currently  Other Topics Concern  . Not on file  Social History Narrative  . Not on file   Social Determinants of Health   Financial Resource Strain: Low Risk   . Difficulty of Paying Living Expenses: Not hard at all  Food Insecurity: No Food Insecurity  . Worried About Charity fundraiser in the Last Year: Never true  . Ran Out of Food in the Last Year: Never true  Transportation Needs: No Transportation Needs  . Lack of Transportation (Medical): No  . Lack of Transportation (Non-Medical): No  Physical Activity: Insufficiently Active  . Days of  Exercise per Week: 3 days  . Minutes of Exercise per Session: 30 min  Stress: No Stress Concern Present  . Feeling of Stress : Only a little  Social Connections: Socially Isolated  . Frequency of Communication with Friends and Family: More than three times a week  . Frequency of Social Gatherings with Friends and Family: More than three times a week  . Attends Religious Services: Never  . Active Member of Clubs or Organizations: No  . Attends Archivist Meetings: Never  . Marital Status: Widowed  Intimate Partner Violence: Not At Risk  . Fear of Current or Ex-Partner: No  . Emotionally Abused: No  . Physically Abused: No  . Sexually Abused: No        Objective:    BP 111/70   Pulse 78   Temp (!) 96.6 F (35.9 C)   Wt 116 lb 12.8 oz (53 kg)   SpO2 98%   BMI 22.07 kg/m   Wt Readings from Last 3 Encounters:  11/13/20 116 lb 12.8 oz (53 kg)  11/05/20 115 lb 12.8 oz (52.5 kg)  10/20/20 115 lb 3.2 oz (52.3 kg)    Physical Exam Vitals reviewed.  Constitutional:      General: She is not in acute distress.    Appearance: Normal appearance. She is normal weight. She is not ill-appearing, toxic-appearing or diaphoretic.  HENT:     Head: Normocephalic and atraumatic.  Eyes:     General: No scleral icterus.       Right eye: No discharge.        Left eye: No discharge.     Conjunctiva/sclera: Conjunctivae normal.  Cardiovascular:     Rate and Rhythm: Normal rate and regular rhythm.     Heart sounds: Normal heart sounds. No murmur heard. No friction rub. No gallop.   Pulmonary:     Effort: Pulmonary effort is normal. No respiratory distress.     Breath sounds: Normal breath sounds. No  stridor. No wheezing, rhonchi or rales.  Musculoskeletal:        General: Normal range of motion.     Cervical back: Normal range of motion.  Skin:    General: Skin is warm and dry.     Capillary Refill: Capillary refill takes less than 2 seconds.  Neurological:     General: No  focal deficit present.     Mental Status: She is alert and oriented to person, place, and time. Mental status is at baseline.  Psychiatric:        Mood and Affect: Mood normal.        Behavior: Behavior normal.        Thought Content: Thought content normal.        Judgment: Judgment normal.     Lab Results  Component Value Date   TSH 0.009 (L) 05/07/2020   Lab Results  Component Value Date   WBC 5.6 09/30/2020   HGB 9.9 (L) 09/30/2020   HCT 33.5 (L) 09/30/2020   MCV 104.4 (H) 09/30/2020   PLT 301 09/30/2020   Lab Results  Component Value Date   NA 138 09/30/2020   K 3.5 09/30/2020   CO2 25 09/30/2020   GLUCOSE 185 (H) 09/30/2020   BUN 13 09/30/2020   CREATININE 0.78 09/30/2020   BILITOT 0.4 09/30/2020   ALKPHOS 85 09/30/2020   AST 55 (H) 09/30/2020   ALT 27 09/30/2020   PROT 6.9 09/30/2020   ALBUMIN 3.7 09/30/2020   CALCIUM 8.2 (L) 09/30/2020   ANIONGAP 8 09/30/2020   Lab Results  Component Value Date   CHOL 110 05/07/2020   Lab Results  Component Value Date   HDL 42 05/07/2020   Lab Results  Component Value Date   LDLCALC 50 05/07/2020   Lab Results  Component Value Date   TRIG 92 05/07/2020   Lab Results  Component Value Date   CHOLHDL 2.6 05/07/2020   Lab Results  Component Value Date   HGBA1C 7.0 (H) 03/10/2020

## 2020-11-14 LAB — CBC WITH DIFFERENTIAL/PLATELET
Basophils Absolute: 0 10*3/uL (ref 0.0–0.2)
Basos: 0 %
EOS (ABSOLUTE): 0.1 10*3/uL (ref 0.0–0.4)
Eos: 1 %
Hematocrit: 33.2 % — ABNORMAL LOW (ref 34.0–46.6)
Hemoglobin: 10.6 g/dL — ABNORMAL LOW (ref 11.1–15.9)
Immature Grans (Abs): 0 10*3/uL (ref 0.0–0.1)
Immature Granulocytes: 0 %
Lymphocytes Absolute: 2.4 10*3/uL (ref 0.7–3.1)
Lymphs: 24 %
MCH: 29.6 pg (ref 26.6–33.0)
MCHC: 31.9 g/dL (ref 31.5–35.7)
MCV: 93 fL (ref 79–97)
Monocytes Absolute: 0.8 10*3/uL (ref 0.1–0.9)
Monocytes: 8 %
Neutrophils Absolute: 6.7 10*3/uL (ref 1.4–7.0)
Neutrophils: 67 %
Platelets: 364 10*3/uL (ref 150–450)
RBC: 3.58 x10E6/uL — ABNORMAL LOW (ref 3.77–5.28)
RDW: 12.7 % (ref 11.7–15.4)
WBC: 10 10*3/uL (ref 3.4–10.8)

## 2020-11-14 LAB — LIPID PANEL
Chol/HDL Ratio: 2.5 ratio (ref 0.0–4.4)
Cholesterol, Total: 201 mg/dL — ABNORMAL HIGH (ref 100–199)
HDL: 82 mg/dL (ref >39)
LDL Chol Calc (NIH): 96 mg/dL (ref 0–99)
Triglycerides: 136 mg/dL (ref 0–149)
VLDL Cholesterol Cal: 23 mg/dL (ref 5–40)

## 2020-11-14 LAB — CMP14+EGFR
ALT: 6 IU/L (ref 0–32)
AST: 12 IU/L (ref 0–40)
Albumin/Globulin Ratio: 1.3 (ref 1.2–2.2)
Albumin: 3.9 g/dL (ref 3.8–4.8)
Alkaline Phosphatase: 118 IU/L (ref 44–121)
BUN/Creatinine Ratio: 14 (ref 12–28)
BUN: 14 mg/dL (ref 8–27)
Bilirubin Total: <0.2 mg/dL (ref 0.0–1.2)
CO2: 23 mmol/L (ref 20–29)
Calcium: 9.3 mg/dL (ref 8.7–10.3)
Chloride: 102 mmol/L (ref 96–106)
Creatinine, Ser: 0.97 mg/dL (ref 0.57–1.00)
GFR calc Af Amer: 72 mL/min/{1.73_m2} (ref >59)
GFR calc non Af Amer: 62 mL/min/{1.73_m2} (ref >59)
Globulin, Total: 2.9 g/dL (ref 1.5–4.5)
Glucose: 149 mg/dL — ABNORMAL HIGH (ref 65–99)
Potassium: 4 mmol/L (ref 3.5–5.2)
Sodium: 141 mmol/L (ref 134–144)
Total Protein: 6.8 g/dL (ref 6.0–8.5)

## 2020-11-14 LAB — THYROID PANEL WITH TSH
Free Thyroxine Index: 2.2 (ref 1.2–4.9)
T3 Uptake Ratio: 27 % (ref 24–39)
T4, Total: 8.3 ug/dL (ref 4.5–12.0)
TSH: 6.9 u[IU]/mL — ABNORMAL HIGH (ref 0.450–4.500)

## 2020-11-14 LAB — VITAMIN B12: Vitamin B-12: 841 pg/mL (ref 232–1245)

## 2020-11-15 ENCOUNTER — Encounter: Payer: Self-pay | Admitting: Family Medicine

## 2020-11-16 ENCOUNTER — Other Ambulatory Visit (HOSPITAL_COMMUNITY): Payer: Self-pay

## 2020-11-16 DIAGNOSIS — D472 Monoclonal gammopathy: Secondary | ICD-10-CM

## 2020-11-17 ENCOUNTER — Other Ambulatory Visit: Payer: Self-pay

## 2020-11-17 ENCOUNTER — Inpatient Hospital Stay (HOSPITAL_COMMUNITY): Payer: 59 | Attending: Hematology

## 2020-11-17 DIAGNOSIS — D472 Monoclonal gammopathy: Secondary | ICD-10-CM | POA: Insufficient documentation

## 2020-11-17 LAB — CBC WITH DIFFERENTIAL/PLATELET
Abs Immature Granulocytes: 0.01 10*3/uL (ref 0.00–0.07)
Basophils Absolute: 0 10*3/uL (ref 0.0–0.1)
Basophils Relative: 0 %
Eosinophils Absolute: 0.1 10*3/uL (ref 0.0–0.5)
Eosinophils Relative: 2 %
HCT: 32.9 % — ABNORMAL LOW (ref 36.0–46.0)
Hemoglobin: 9.9 g/dL — ABNORMAL LOW (ref 12.0–15.0)
Immature Granulocytes: 0 %
Lymphocytes Relative: 27 %
Lymphs Abs: 1.7 10*3/uL (ref 0.7–4.0)
MCH: 29.6 pg (ref 26.0–34.0)
MCHC: 30.1 g/dL (ref 30.0–36.0)
MCV: 98.5 fL (ref 80.0–100.0)
Monocytes Absolute: 0.6 10*3/uL (ref 0.1–1.0)
Monocytes Relative: 10 %
Neutro Abs: 3.9 10*3/uL (ref 1.7–7.7)
Neutrophils Relative %: 61 %
Platelets: 314 10*3/uL (ref 150–400)
RBC: 3.34 MIL/uL — ABNORMAL LOW (ref 3.87–5.11)
RDW: 13.8 % (ref 11.5–15.5)
WBC: 6.4 10*3/uL (ref 4.0–10.5)
nRBC: 0 % (ref 0.0–0.2)

## 2020-11-17 LAB — COMPREHENSIVE METABOLIC PANEL
ALT: 9 U/L (ref 0–44)
AST: 13 U/L — ABNORMAL LOW (ref 15–41)
Albumin: 3.4 g/dL — ABNORMAL LOW (ref 3.5–5.0)
Alkaline Phosphatase: 91 U/L (ref 38–126)
Anion gap: 8 (ref 5–15)
BUN: 13 mg/dL (ref 8–23)
CO2: 26 mmol/L (ref 22–32)
Calcium: 8.5 mg/dL — ABNORMAL LOW (ref 8.9–10.3)
Chloride: 98 mmol/L (ref 98–111)
Creatinine, Ser: 0.92 mg/dL (ref 0.44–1.00)
GFR, Estimated: >60 mL/min (ref >60)
Glucose, Bld: 111 mg/dL — ABNORMAL HIGH (ref 70–99)
Potassium: 3.9 mmol/L (ref 3.5–5.1)
Sodium: 132 mmol/L — ABNORMAL LOW (ref 135–145)
Total Bilirubin: 0.4 mg/dL (ref 0.3–1.2)
Total Protein: 7.3 g/dL (ref 6.5–8.1)

## 2020-11-18 LAB — PROTEIN ELECTROPHORESIS, SERUM
A/G Ratio: 0.9 (ref 0.7–1.7)
Albumin ELP: 3.1 g/dL (ref 2.9–4.4)
Alpha-1-Globulin: 0.4 g/dL (ref 0.0–0.4)
Alpha-2-Globulin: 0.9 g/dL (ref 0.4–1.0)
Beta Globulin: 1 g/dL (ref 0.7–1.3)
Gamma Globulin: 1.1 g/dL (ref 0.4–1.8)
Globulin, Total: 3.4 g/dL (ref 2.2–3.9)
Total Protein ELP: 6.5 g/dL (ref 6.0–8.5)

## 2020-11-18 LAB — KAPPA/LAMBDA LIGHT CHAINS
Kappa free light chain: 47.5 mg/L — ABNORMAL HIGH (ref 3.3–19.4)
Kappa, lambda light chain ratio: 1.67 — ABNORMAL HIGH (ref 0.26–1.65)
Lambda free light chains: 28.5 mg/L — ABNORMAL HIGH (ref 5.7–26.3)

## 2020-11-19 LAB — IMMUNOFIXATION ELECTROPHORESIS
IgA: 300 mg/dL (ref 87–352)
IgG (Immunoglobin G), Serum: 1120 mg/dL (ref 586–1602)
IgM (Immunoglobulin M), Srm: 185 mg/dL (ref 26–217)
Total Protein ELP: 6.6 g/dL (ref 6.0–8.5)

## 2020-12-02 ENCOUNTER — Other Ambulatory Visit (HOSPITAL_COMMUNITY): Payer: Self-pay | Admitting: *Deleted

## 2020-12-03 ENCOUNTER — Other Ambulatory Visit: Payer: Self-pay | Admitting: *Deleted

## 2020-12-03 ENCOUNTER — Other Ambulatory Visit: Payer: Self-pay

## 2020-12-03 ENCOUNTER — Ambulatory Visit (HOSPITAL_COMMUNITY)
Admission: RE | Admit: 2020-12-03 | Discharge: 2020-12-03 | Disposition: A | Discharge status: Home / Self Care | Payer: 59 | Source: Ambulatory Visit | Attending: Rheumatology | Admitting: Rheumatology

## 2020-12-03 DIAGNOSIS — E1165 Type 2 diabetes mellitus with hyperglycemia: Secondary | ICD-10-CM

## 2020-12-03 DIAGNOSIS — I10 Essential (primary) hypertension: Secondary | ICD-10-CM

## 2020-12-03 DIAGNOSIS — M0579 Rheumatoid arthritis with rheumatoid factor of multiple sites without organ or systems involvement: Secondary | ICD-10-CM | POA: Diagnosis present

## 2020-12-03 MED ORDER — ACETAMINOPHEN 500 MG PO TABS
ORAL_TABLET | ORAL | Status: AC
  Filled 2020-12-03: qty 2

## 2020-12-03 MED ORDER — FAMOTIDINE 20 MG PO TABS
20.0000 mg | ORAL_TABLET | ORAL | Status: DC
  Administered 2020-12-03: 20 mg via ORAL

## 2020-12-03 MED ORDER — LEVOTHYROXINE SODIUM 100 MCG PO TABS: 100.0000 ug | ORAL_TABLET | Freq: Every morning | ORAL | 0 refills | Status: DC

## 2020-12-03 MED ORDER — FAMOTIDINE 20 MG PO TABS
ORAL_TABLET | ORAL | Status: AC
  Filled 2020-12-03: qty 1

## 2020-12-03 MED ORDER — ACETAMINOPHEN 500 MG PO TABS
1000.0000 mg | ORAL_TABLET | ORAL | Status: DC
  Administered 2020-12-03: 1000 mg via ORAL

## 2020-12-03 MED ORDER — SODIUM CHLORIDE 0.9 % IV SOLN
2.0000 mg/kg | INTRAVENOUS | Status: DC
  Administered 2020-12-03: 106.25 mg via INTRAVENOUS
  Filled 2020-12-03: qty 8.5

## 2020-12-03 NOTE — Addendum Note (Signed)
Addended by: Antonietta Barcelona D on: 12/03/2020 02:54 PM   Modules accepted: Orders

## 2020-12-22 ENCOUNTER — Inpatient Hospital Stay (HOSPITAL_COMMUNITY): Payer: Medicaid Other | Attending: Hematology

## 2020-12-22 ENCOUNTER — Other Ambulatory Visit: Payer: Self-pay

## 2020-12-22 DIAGNOSIS — E538 Deficiency of other specified B group vitamins: Secondary | ICD-10-CM | POA: Insufficient documentation

## 2020-12-22 DIAGNOSIS — D472 Monoclonal gammopathy: Secondary | ICD-10-CM | POA: Diagnosis not present

## 2020-12-22 LAB — COMPREHENSIVE METABOLIC PANEL
ALT: 9 U/L (ref 0–44)
AST: 13 U/L — ABNORMAL LOW (ref 15–41)
Albumin: 3.4 g/dL — ABNORMAL LOW (ref 3.5–5.0)
Alkaline Phosphatase: 97 U/L (ref 38–126)
Anion gap: 9 (ref 5–15)
BUN: 13 mg/dL (ref 8–23)
CO2: 26 mmol/L (ref 22–32)
Calcium: 8.6 mg/dL — ABNORMAL LOW (ref 8.9–10.3)
Chloride: 103 mmol/L (ref 98–111)
Creatinine, Ser: 0.83 mg/dL (ref 0.44–1.00)
GFR, Estimated: >60 mL/min (ref >60)
Glucose, Bld: 190 mg/dL — ABNORMAL HIGH (ref 70–99)
Potassium: 4 mmol/L (ref 3.5–5.1)
Sodium: 138 mmol/L (ref 135–145)
Total Bilirubin: 0.4 mg/dL (ref 0.3–1.2)
Total Protein: 7.1 g/dL (ref 6.5–8.1)

## 2020-12-22 LAB — CBC WITH DIFFERENTIAL/PLATELET
Abs Immature Granulocytes: 0 10*3/uL (ref 0.00–0.07)
Basophils Absolute: 0 10*3/uL (ref 0.0–0.1)
Basophils Relative: 0 %
Eosinophils Absolute: 0.2 10*3/uL (ref 0.0–0.5)
Eosinophils Relative: 4 %
HCT: 33.2 % — ABNORMAL LOW (ref 36.0–46.0)
Hemoglobin: 9.7 g/dL — ABNORMAL LOW (ref 12.0–15.0)
Immature Granulocytes: 0 %
Lymphocytes Relative: 24 %
Lymphs Abs: 1.7 10*3/uL (ref 0.7–4.0)
MCH: 29.3 pg (ref 26.0–34.0)
MCHC: 29.2 g/dL — ABNORMAL LOW (ref 30.0–36.0)
MCV: 100.3 fL — ABNORMAL HIGH (ref 80.0–100.0)
Monocytes Absolute: 0.6 10*3/uL (ref 0.1–1.0)
Monocytes Relative: 9 %
Neutro Abs: 4.3 10*3/uL (ref 1.7–7.7)
Neutrophils Relative %: 63 %
Platelets: 267 10*3/uL (ref 150–400)
RBC: 3.31 MIL/uL — ABNORMAL LOW (ref 3.87–5.11)
RDW: 16.2 % — ABNORMAL HIGH (ref 11.5–15.5)
WBC: 6.8 10*3/uL (ref 4.0–10.5)
nRBC: 0 % (ref 0.0–0.2)

## 2020-12-22 LAB — VITAMIN B12: Vitamin B-12: 318 pg/mL (ref 180–914)

## 2020-12-29 ENCOUNTER — Other Ambulatory Visit: Payer: Self-pay

## 2020-12-29 ENCOUNTER — Inpatient Hospital Stay (HOSPITAL_COMMUNITY): Payer: 59 | Attending: Hematology | Admitting: Hematology

## 2020-12-29 VITALS — BP 128/82 | HR 88 | Temp 96.9°F | Resp 18 | Wt 116.4 lb

## 2020-12-29 DIAGNOSIS — F32A Depression, unspecified: Secondary | ICD-10-CM | POA: Insufficient documentation

## 2020-12-29 DIAGNOSIS — R634 Abnormal weight loss: Secondary | ICD-10-CM | POA: Diagnosis not present

## 2020-12-29 DIAGNOSIS — Z803 Family history of malignant neoplasm of breast: Secondary | ICD-10-CM | POA: Insufficient documentation

## 2020-12-29 DIAGNOSIS — M069 Rheumatoid arthritis, unspecified: Secondary | ICD-10-CM | POA: Insufficient documentation

## 2020-12-29 DIAGNOSIS — Z79899 Other long term (current) drug therapy: Secondary | ICD-10-CM | POA: Diagnosis not present

## 2020-12-29 DIAGNOSIS — D649 Anemia, unspecified: Secondary | ICD-10-CM | POA: Diagnosis present

## 2020-12-29 DIAGNOSIS — R803 Bence Jones proteinuria: Secondary | ICD-10-CM | POA: Diagnosis not present

## 2020-12-29 DIAGNOSIS — E876 Hypokalemia: Secondary | ICD-10-CM | POA: Insufficient documentation

## 2020-12-29 DIAGNOSIS — R110 Nausea: Secondary | ICD-10-CM | POA: Diagnosis not present

## 2020-12-29 DIAGNOSIS — Z87891 Personal history of nicotine dependence: Secondary | ICD-10-CM | POA: Diagnosis not present

## 2020-12-29 DIAGNOSIS — R630 Anorexia: Secondary | ICD-10-CM | POA: Insufficient documentation

## 2020-12-29 DIAGNOSIS — D472 Monoclonal gammopathy: Secondary | ICD-10-CM | POA: Diagnosis present

## 2020-12-29 NOTE — Progress Notes (Signed)
Erica Beasley, [place] [address]   CLINIC:  Medical Oncology/Hematology  PCP:  Loman Brooklyn, [place] / [place] [phone]  [phone]  REASON FOR VISIT:  Follow-up for monoclonal gammopathy and normocytic anemia  PRIOR THERAPY: None  CURRENT THERAPY: Observation  INTERVAL HISTORY:  Erica Beasley, a 65 y.o. female, returns for routine follow-up for her monoclonal gammopathy and normocytic anemia. Erica Beasley was last seen by Faythe Casa, NP, on 10/06/2020.  Today she reports feeling okay. She continues taking Pancrelipase 2 tablets with meals and 1 with snacks and tolerating it well; she weight is stable. She continues getting the B12 injections once a month, given by her daughter. She complains of feeling cold often, especially at night. She stopped taking Megace since her appetite improved.   REVIEW OF SYSTEMS:  Review of Systems  Constitutional: Positive for appetite change (75%) and fatigue (50%). Negative for unexpected weight change.  All other systems reviewed and are negative.   PAST MEDICAL/SURGICAL HISTORY:  Past Medical History:  Diagnosis Date  . Aortic atherosclerosis ([place]) 11/13/2020  . CKD (chronic kidney disease) stage 2, GFR 60-89 ml/min   . COPD (chronic obstructive pulmonary disease) ([place])   . COVID-19 06/2019  . Depression   . Essential hypertension   . GERD (gastroesophageal reflux disease)   . History of diabetes mellitus   . Hyperlipidemia   . Hypothyroidism   . Neuropathy   . Rheumatoid arthritis ([place])   . Vitamin B12 deficiency   . Vitamin D deficiency    Past Surgical History:  Procedure Laterality Date  . BIOPSY  09/16/2019   Procedure: BIOPSY;  Surgeon: Daneil Dolin, MD;  [facility];  Service: Endoscopy;;  . BIOPSY  04/28/2020   Procedure: BIOPSY;  Surgeon: Daneil Dolin, MD;  [facility];  Service: Endoscopy;;  . BIOPSY  06/08/2020   Procedure: BIOPSY;   Surgeon: Daneil Dolin, MD;  [facility];  Service: Endoscopy;;  . CATARACT EXTRACTION Bilateral   . COLONOSCOPY  09/2010   Dr. Posey Pronto: mild diverticulsis in sigmoid colon.   . COLONOSCOPY WITH PROPOFOL N/A 09/16/2019   Rourk: Diverticulosis, random colon biopsies negative for microscopic colitis.  . COLONOSCOPY WITH PROPOFOL N/A 06/08/2020   Procedure: COLONOSCOPY WITH PROPOFOL;  Surgeon: Daneil Dolin, MD;  [facility];  Service: Endoscopy;  Laterality: N/A;  9:15am  . ESOPHAGOGASTRODUODENOSCOPY (EGD) WITH PROPOFOL N/A 04/28/2020   Procedure: ESOPHAGOGASTRODUODENOSCOPY (EGD) WITH PROPOFOL;  Surgeon: Daneil Dolin, MD;  [facility];  Service: Endoscopy;  Laterality: N/A;  2:15  . YAG LASER APPLICATION Left 93/23/5573   Procedure: YAG LASER APPLICATION;  Surgeon: Williams Che, MD;  Location: [facility];  Service: Ophthalmology;  Laterality: Left;    SOCIAL HISTORY:  Social History   Socioeconomic History  . Marital status: Widowed    Spouse name: Not on file  . Number of children: 1  . Years of education: Not on file  . Highest education level: Not on file  Occupational History  . Occupation: DISABLED  Tobacco Use  . Smoking status: Former Smoker    Packs/day: 1.00    Years: 15.00    Pack years: 15.00    Types: Cigarettes    Quit date: 09/11/2017    Years since quitting: 3.3  . Smokeless tobacco: Former Network engineer  . Vaping Use: Never used  Substance and  Sexual Activity  . Alcohol use: Never  . Drug use: Never  . Sexual activity: Not Currently  Other Topics Concern  . Not on file  Social History Narrative  . Not on file   Social Determinants of Health   Financial Resource Strain: Low Risk   . Difficulty of Paying Living Expenses: Not hard at all  Food Insecurity: No Food Insecurity  . Worried About Charity fundraiser in the Last Year: Never true  . Ran Out of Food in the Last Year: Never true  Transportation Needs: No  Transportation Needs  . Lack of Transportation (Medical): No  . Lack of Transportation (Non-Medical): No  Physical Activity: Insufficiently Active  . Days of Exercise per Week: 3 days  . Minutes of Exercise per Session: 30 min  Stress: No Stress Concern Present  . Feeling of Stress : Only a little  Social Connections: Socially Isolated  . Frequency of Communication with Friends and Family: More than three times a week  . Frequency of Social Gatherings with Friends and Family: More than three times a week  . Attends Religious Services: Never  . Active Member of Clubs or Organizations: No  . Attends Archivist Meetings: Never  . Marital Status: Widowed  Intimate Partner Violence: Not At Risk  . Fear of Current or Ex-Partner: No  . Emotionally Abused: No  . Physically Abused: No  . Sexually Abused: No    FAMILY HISTORY:  Family History  Problem Relation Age of Onset  . Stroke Mother   . Heart disease Mother   . Diabetes Mother   . Breast cancer Mother   . Other Father        stomach taken out for some reason  . Migraines Daughter   . Colon cancer Neg Hx     CURRENT MEDICATIONS:  Current Outpatient Medications  Medication Sig Dispense Refill  . Cholecalciferol (VITAMIN D3) 125 MCG (5000 UT) TABS Take 1 tablet (5,000 Units total) by mouth daily. 30 tablet 3  . citalopram (CELEXA) 40 MG tablet Take 1 tablet (40 mg total) by mouth daily. 90 tablet 1  . cyanocobalamin (,VITAMIN B-12,) 1000 MCG/ML injection Inject 1 mL (1,000 mcg total) into the muscle every 30 (thirty) days for 6 doses. 6 mL 0  . diclofenac Sodium (VOLTAREN) 1 % GEL See admin instructions.    . ferrous sulfate 325 (65 FE) MG tablet Take 325 mg by mouth daily with breakfast.    . gabapentin (NEURONTIN) 300 MG capsule Take 1 capsule (300 mg total) by mouth 3 (three) times daily. 90 capsule 5  . glipiZIDE (GLUCOTROL XL) 5 MG 24 hr tablet 1 tablet    . golimumab (SIMPONI ARIA) 50 MG/4ML SOLN injection See  admin instructions.    . hydroxychloroquine (PLAQUENIL) 200 MG tablet Take 200 mg by mouth daily.     Marland Kitchen leflunomide (ARAVA) 20 MG tablet Take 20 mg by mouth daily.    Marland Kitchen levothyroxine (SYNTHROID) 100 MCG tablet Take 1 tablet (100 mcg total) by mouth every morning. 90 tablet 0  . lisinopril (ZESTRIL) 10 MG tablet Take 1 tablet (10 mg total) by mouth daily. 90 tablet 1  . loperamide (IMODIUM A-D) 2 MG tablet Take 1 tablet (2 mg total) by mouth in the morning. Take second dose if needed (Patient taking differently: Take 2 mg by mouth as needed. Take second dose if needed) 30 tablet 0  . Pancrelipase, Lip-Prot-Amyl, (CREON) 24000-76000 units CPEP Take 48,000 units (2  capsules) with meals and 24000 units (1 capsule) with snacks (Patient taking differently: Take 24,000-48,000 Units by mouth See admin instructions. Take 48,000 units (2 capsules) with meals and 24000 units (1 capsule) with snacks) 240 capsule 2  . pantoprazole (PROTONIX) 40 MG tablet TAKE ONE TABLET BY MOUTH DAILY BEFORE BREAKFAST 90 tablet 1  . potassium chloride (KLOR-CON) 10 MEQ tablet 1 tablet with food    . potassium chloride SA (KLOR-CON) 20 MEQ tablet Take 1 tablet (20 mEq total) by mouth daily. 90 tablet 1  . pravastatin (PRAVACHOL) 40 MG tablet Take 1 tablet (40 mg total) by mouth at bedtime. 90 tablet 1  . predniSONE (DELTASONE) 5 MG tablet Take 5 mg by mouth daily.    . raloxifene (EVISTA) 60 MG tablet 1 tablet    . Tocilizumab (ACTEMRA ACTPEN) 162 MG/0.9ML SOAJ Inject the contents of 1 pen (162mg ) under the skin once a week as directed    . Tofacitinib Citrate ER (XELJANZ XR) 11 MG TB24 Take 1 tablet by mouth daily.    . traZODone (DESYREL) 100 MG tablet Take 1 tablet (100 mg total) by mouth at bedtime as needed for sleep. 90 tablet 1  . albuterol (VENTOLIN HFA) 108 (90 Base) MCG/ACT inhaler Inhale 2 puffs into the lungs every 6 (six) hours as needed for wheezing or shortness of breath. (Patient not taking: Reported on 12/29/2020)  18 g 2   No current facility-administered medications for this visit.    ALLERGIES:  Allergies  Allergen Reactions  . Adalimumab     Other reaction(s): no response    PHYSICAL EXAM:  Performance status (ECOG): 1 - Symptomatic but completely ambulatory  Vitals:   12/29/20 1447  BP: 128/82  Pulse: 88  Resp: 18  Temp: (!) 96.9 F (36.1 C)  SpO2: 96%   Wt Readings from Last 3 Encounters:  12/29/20 116 lb 6.4 oz (52.8 kg)  12/03/20 113 lb (51.3 kg)  11/13/20 116 lb 12.8 oz (53 kg)   Physical Exam Vitals reviewed.  Constitutional:      Appearance: Normal appearance.  Cardiovascular:     Rate and Rhythm: Normal rate and regular rhythm.     Pulses: Normal pulses.     Heart sounds: Normal heart sounds.  Pulmonary:     Effort: Pulmonary effort is normal.     Breath sounds: Normal breath sounds.  Musculoskeletal:     Right lower leg: No edema.     Left lower leg: No edema.  Neurological:     General: No focal deficit present.     Mental Status: She is alert and oriented to person, place, and time.  Psychiatric:        Mood and Affect: Mood normal.        Behavior: Behavior normal.     LABORATORY DATA:  I have reviewed the labs as listed.  CBC Latest Ref Rng & Units 12/22/2020 11/17/2020 11/13/2020  WBC 4.0 - 10.5 K/uL 6.8 6.4 10.0  Hemoglobin 12.0 - 15.0 g/dL 9.7(L) 9.9(L) 10.6(L)  Hematocrit 36.0 - 46.0 % 33.2(L) 32.9(L) 33.2(L)  Platelets 150 - 400 K/uL 267 314 364   CMP Latest Ref Rng & Units 12/22/2020 11/17/2020 11/13/2020  Glucose 70 - 99 mg/dL 190(H) 111(H) 149(H)  BUN 8 - 23 mg/dL 13 13 14   Creatinine 0.44 - 1.00 mg/dL 0.83 0.92 0.97  Sodium 135 - 145 mmol/L 138 132(L) 141  Potassium 3.5 - 5.1 mmol/L 4.0 3.9 4.0  Chloride 98 - 111 mmol/L  103 98 102  CO2 22 - 32 mmol/L 26 26 23   Calcium 8.9 - 10.3 mg/dL 8.6(L) 8.5(L) 9.3  Total Protein 6.5 - 8.1 g/dL 7.1 7.3 6.8  Total Bilirubin 0.3 - 1.2 mg/dL 0.4 0.4 <0.2  Alkaline Phos 38 - 126 U/L 97 91 118  AST 15 -  41 U/L 13(L) 13(L) 12  ALT 0 - 44 U/L 9 9 6       Component Value Date/Time   RBC 3.31 (L) 12/22/2020 1328   MCV 100.3 (H) 12/22/2020 1328   MCV 93 11/13/2020 1129   MCH 29.3 12/22/2020 1328   MCHC 29.2 (L) 12/22/2020 1328   RDW 16.2 (H) 12/22/2020 1328   RDW 12.7 11/13/2020 1129   LYMPHSABS 1.7 12/22/2020 1328   LYMPHSABS 2.4 11/13/2020 1129   MONOABS 0.6 12/22/2020 1328   EOSABS 0.2 12/22/2020 1328   EOSABS 0.1 11/13/2020 1129   BASOSABS 0.0 12/22/2020 1328   BASOSABS 0.0 11/13/2020 1129   Lab Results  Component Value Date   TOTALPROTELP 6.6 11/17/2020   TOTALPROTELP 6.5 11/17/2020   ALBUMINELP 3.1 11/17/2020   A1GS 0.4 11/17/2020   A2GS 0.9 11/17/2020   BETS 1.0 11/17/2020   GAMS 1.1 11/17/2020   MSPIKE Not Observed 11/17/2020   SPEI Comment 11/17/2020    Lab Results  Component Value Date   KPAFRELGTCHN 47.5 (H) 11/17/2020   LAMBDASER 28.5 (H) 11/17/2020   KAPLAMBRATIO 1.67 (H) 11/17/2020    DIAGNOSTIC IMAGING:  I have independently reviewed the scans and discussed with the patient. No results found.   ASSESSMENT:  1. Bence-Jones proteinuria: -24-hour urine done by Dr. Theador Hawthorne showed urine immunofixation positive for Bence-Jones proteinuria, kappa type. 24-hour total protein was 77 mg/day. -Free kappa light chains were 52.9, lambda light chains 29.8 with ratio of 1.78. Serum immunofixation was unremarkable. SPEP did not show any evidence of M spike. -Labs on 04/03/2020 shows negative immunofixation. Kappa light chain 61.3, lambda light chains 32.3, ratio 1.9. -Skeletal survey on 04/03/2020 was negative for lytic lesions.  2. Weight loss: -25 pound weight loss in the last 2 months. -She smoked cigarettes 1 pack/day for 46 years, and quit 3 years ago. -She reports that she cannot eat due to decreased appetite. Also feels very nauseous on smelling of foods. -EGD on 04/28/2020 with stomach and duodenal biopsy negative. -CT CAP on 05/11/2020 showed long segment  colonic thickening likely diverticular disease. Stable small juxtapleural nodule in the right upper chest benign over 1 year. Moderate large fat-containing left inguinal hernia. Emphysema and diverticular calcification. -Colonoscopy on 06/08/2020 shows pancolonic diverticulosis, mild. Normal-appearing colonic mucosa otherwise. Random biopsies were negative.  3. Normocytic anemia: -CBC on 03/20/2020 shows hemoglobin 9.9 with MCV of 93. White count and platelets were normal. -Colonoscopy on 09/16/2019 shows diverticulosis in the entire examined colon. Nonbleeding internal hemorrhoids.  4. Rheumatoid arthritis: -She is on Plaquenil and Areva for the last 4 years.   PLAN:  1. Bence-Jones proteinuria: -Myeloma labs on 11/17/2020 showed no M spike on SPEP.  Free kappa light chains of 47 with ratio of 1.67.  Immunofixation was negative.  No "crab" features.  We will plan to repeat in 6 months.  2. Weight loss: -Continue pancrelipase 2 capsules with meals and 1 capsule with snack. -Her weight has been stable.  She is not taking Megace anymore.  She is eating better.  3. Normocytic anemia: -Labs consistent with anemia from chronic inflammation. -Hemoglobin improved to 9.7. -Continue B12 injection monthly.  B12 level is 318. -  RTC 2 months with repeat labs.  4. Depression: -Continue Celexa 40 mg daily.  5.  Hypokalemia: -Continue potassium daily.  Potassium today is 4.0.  Orders placed this encounter:  Orders Placed This Encounter  Procedures  . CBC with Differential/Platelet  . Ferritin  . Iron and TIBC  . Vitamin B12     Derek Jack, MD [facility] [phone]   I, Milinda Antis, am acting as a scribe for Dr. Sanda Linger.  I, Derek Jack MD, have reviewed the above documentation for accuracy and completeness, and I agree with the above.

## 2020-12-29 NOTE — Patient Instructions (Signed)
[place] at [facility] Discharge Instructions  You were seen today by Dr. Delton Coombes. He went over your recent results. Continue getting your monthly vitamin B12 injections. Dr. Delton Coombes will see you back in 2 months for labs and follow up.   Thank you for choosing [place] at [facility] to provide your oncology and hematology care.  To afford each patient quality time with our provider, please arrive at least 15 minutes before your scheduled appointment time.   If you have a lab appointment with the [facility] please come in thru the Main Entrance and check in at the main information desk  You need to re-schedule your appointment should you arrive 10 or more minutes late.  We strive to give you quality time with our providers, and arriving late affects you and other patients whose appointments are after yours.  Also, if you no show three or more times for appointments you may be dismissed from the clinic at the providers discretion.     Again, thank you for choosing [facility].  Our hope is that these requests will decrease the amount of time that you wait before being seen by our physicians.       _____________________________________________________________  Should you have questions after your visit to [facility], please contact our office at [phone] between the hours of 8:00 a.m. and 4:30 p.m.  Voicemails left after 4:00 p.m. will not be returned until the following business day.  For prescription refill requests, have your pharmacy contact our office and allow 72 hours.    Cancer Center Support Programs:   > Cancer Support Group  2nd Tuesday of the month 1pm-2pm, Journey Room

## 2020-12-30 ENCOUNTER — Other Ambulatory Visit (HOSPITAL_COMMUNITY): Payer: Self-pay

## 2020-12-31 ENCOUNTER — Ambulatory Visit (HOSPITAL_COMMUNITY)
Admission: RE | Admit: 2020-12-31 | Discharge: 2020-12-31 | Disposition: A | Discharge status: Home / Self Care | Payer: 59 | Source: Ambulatory Visit | Attending: Physician Assistant | Admitting: Physician Assistant

## 2020-12-31 ENCOUNTER — Other Ambulatory Visit: Payer: Self-pay

## 2020-12-31 DIAGNOSIS — M0579 Rheumatoid arthritis with rheumatoid factor of multiple sites without organ or systems involvement: Secondary | ICD-10-CM | POA: Diagnosis present

## 2020-12-31 MED ORDER — ACETAMINOPHEN 500 MG PO TABS: 1000.0000 mg | ORAL_TABLET | Freq: Once | ORAL | Status: DC

## 2020-12-31 MED ORDER — FAMOTIDINE 20 MG PO TABS
ORAL_TABLET | ORAL | Status: AC
  Administered 2020-12-31: 20 mg via ORAL
  Filled 2020-12-31: qty 1

## 2020-12-31 MED ORDER — FAMOTIDINE 20 MG PO TABS: 20.0000 mg | ORAL_TABLET | Freq: Once | ORAL | Status: DC

## 2020-12-31 MED ORDER — SODIUM CHLORIDE 0.9 % IV SOLN
2.0000 mg/kg | Freq: Once | INTRAVENOUS | Status: DC
  Administered 2020-12-31: 105 mg via INTRAVENOUS
  Filled 2020-12-31: qty 8.4

## 2020-12-31 MED ORDER — ACETAMINOPHEN 500 MG PO TABS
ORAL_TABLET | ORAL | Status: AC
  Administered 2020-12-31: 1000 mg via ORAL
  Filled 2020-12-31: qty 2

## 2021-01-28 ENCOUNTER — Ambulatory Visit (HOSPITAL_COMMUNITY)
Admission: RE | Admit: 2021-01-28 | Discharge: 2021-01-28 | Disposition: A | Discharge status: Home / Self Care | Payer: 59 | Source: Ambulatory Visit | Attending: Physician Assistant | Admitting: Physician Assistant

## 2021-01-28 ENCOUNTER — Other Ambulatory Visit: Payer: Self-pay

## 2021-01-28 DIAGNOSIS — M0579 Rheumatoid arthritis with rheumatoid factor of multiple sites without organ or systems involvement: Secondary | ICD-10-CM | POA: Diagnosis present

## 2021-01-28 MED ORDER — ACETAMINOPHEN 500 MG PO TABS: 1000.0000 mg | ORAL_TABLET | Freq: Once | ORAL | Status: DC

## 2021-01-28 MED ORDER — FAMOTIDINE 20 MG PO TABS: 20.0000 mg | ORAL_TABLET | Freq: Once | ORAL | Status: DC

## 2021-01-28 MED ORDER — SODIUM CHLORIDE 0.9 % IV SOLN
2.0000 mg/kg | Freq: Once | INTRAVENOUS | Status: DC
  Administered 2021-01-28: 105 mg via INTRAVENOUS
  Filled 2021-01-28: qty 8.4

## 2021-02-15 ENCOUNTER — Other Ambulatory Visit: Payer: Self-pay | Admitting: Nurse Practitioner

## 2021-02-15 DIAGNOSIS — R935 Abnormal findings on diagnostic imaging of other abdominal regions, including retroperitoneum: Secondary | ICD-10-CM

## 2021-02-15 DIAGNOSIS — K529 Noninfective gastroenteritis and colitis, unspecified: Secondary | ICD-10-CM

## 2021-02-15 DIAGNOSIS — R112 Nausea with vomiting, unspecified: Secondary | ICD-10-CM

## 2021-02-15 DIAGNOSIS — R634 Abnormal weight loss: Secondary | ICD-10-CM

## 2021-02-16 ENCOUNTER — Encounter: Payer: Self-pay | Admitting: Family Medicine

## 2021-02-16 ENCOUNTER — Other Ambulatory Visit (HOSPITAL_COMMUNITY)
Admission: RE | Admit: 2021-02-16 | Discharge: 2021-02-16 | Disposition: A | Discharge status: Home / Self Care | Payer: 59 | Source: Ambulatory Visit | Attending: Family Medicine | Admitting: Family Medicine

## 2021-02-16 ENCOUNTER — Other Ambulatory Visit: Payer: Self-pay

## 2021-02-16 ENCOUNTER — Ambulatory Visit (INDEPENDENT_AMBULATORY_CARE_PROVIDER_SITE_OTHER): Payer: 59 | Admitting: Family Medicine

## 2021-02-16 VITALS — BP 109/76 | HR 90 | Temp 97.2°F | Ht 61.0 in | Wt 115.2 lb

## 2021-02-16 DIAGNOSIS — Z1151 Encounter for screening for human papillomavirus (HPV): Secondary | ICD-10-CM | POA: Insufficient documentation

## 2021-02-16 DIAGNOSIS — Z113 Encounter for screening for infections with a predominantly sexual mode of transmission: Secondary | ICD-10-CM | POA: Insufficient documentation

## 2021-02-16 DIAGNOSIS — Z124 Encounter for screening for malignant neoplasm of cervix: Secondary | ICD-10-CM

## 2021-02-16 DIAGNOSIS — I700 Atherosclerosis of aorta: Secondary | ICD-10-CM

## 2021-02-16 DIAGNOSIS — Z0000 Encounter for general adult medical examination without abnormal findings: Secondary | ICD-10-CM | POA: Diagnosis not present

## 2021-02-16 DIAGNOSIS — E039 Hypothyroidism, unspecified: Secondary | ICD-10-CM

## 2021-02-16 DIAGNOSIS — I10 Essential (primary) hypertension: Secondary | ICD-10-CM

## 2021-02-16 DIAGNOSIS — G629 Polyneuropathy, unspecified: Secondary | ICD-10-CM

## 2021-02-16 DIAGNOSIS — R7303 Prediabetes: Secondary | ICD-10-CM

## 2021-02-16 LAB — BAYER DCA HB A1C WAIVED: HB A1C (BAYER DCA - WAIVED): 6.3 % (ref <7.0)

## 2021-02-16 MED ORDER — ASPIRIN 81 MG PO TBEC: 81.0000 mg | DELAYED_RELEASE_TABLET | Freq: Every day | ORAL | 12 refills | Status: DC

## 2021-02-16 NOTE — Progress Notes (Signed)
Assessment & Plan:  1. Well adult exam Preventive health education provided.  Patient to check the price of Shingrix at her pharmacy.  Declined HIV screening.  2. Essential hypertension Well controlled on current regimen of 5 mg instead of 10 mg. - CMP14+EGFR  3. Aortic atherosclerosis ([place]) Advised to add a baby aspirin once daily. - aspirin 81 MG EC tablet; Take 1 tablet (81 mg total) by mouth daily. Swallow whole.  Dispense: 30 tablet; Refill: 12  4. Acquired hypothyroidism Labs to assess before refilling levothyroxine. - T4, free - TSH  5. Prediabetes Labs to assess. - Bayer DCA Hb A1c Waived  6. Screening for cervical cancer - Cytology - PAP  7. Routine screening for STI (sexually transmitted infection) - Cytology - PAP  8. Screening for human papillomavirus (HPV) - Cytology - PAP  9. Neuropathy Advised to take as prescribed instead of just at night.   Follow-up: Return in about 3 months (around 05/19/2021) for follow-up of chronic medication conditions.   Hendricks Limes, MSN, APRN, FNP-C [facility]  Subjective:  Patient ID: Erica Beasley, female    DOB: [birth year]  Age: 65 y.o. MRN: [id number]  Patient Care Team: Loman Brooklyn, FNP as PCP - General (Family Medicine) Gala Romney Cristopher Estimable, MD as Consulting Physician (Gastroenterology) Liana Gerold, MD as Consulting Physician (Nephrology) Jacquelin Hawking, NP as Nurse Practitioner (Oncology) [facility] as Consulting Physician (Rheumatology)   CC:  Chief Complaint  Patient presents with  . Gynecologic Exam    HPI Erica Beasley presents for her annual physical.   Occupation: Disabled, Marital status: Widowed, Substance use: None Diet: Regular, Exercise: stretches in bed before getting up Last eye exam: 1 year ago - due next month Last dental exam: does not go (dentures) Last colonoscopy: 06/08/2020 Last mammogram: 07/22/2020 Last pap smear: Unknown Hepatitis C  Screening: 03/10/2020 Immunizations: Flu Vaccine: up to date Tdap Vaccine: up to date  Shingrix Vaccine: at the pharmacy for administration, so it can be run through insurance first   COVID-19 Vaccine: declined  DEPRESSION SCREENING PHQ 2/9 Scores 02/16/2021 11/13/2020 10/20/2020 10/05/2020 09/22/2020 05/07/2020 05/07/2020  PHQ - 2 Score 0 0 0 0 0 3 0  PHQ- 9 Score 3 3 - - 3 9 -     Hypertension: Patient reports she is only taking half a tablet of her lisinopril due to dizziness and feeling like she was going to pass out on the 10 mg dosage.  Neuropathy: Patient reports she is taking gabapentin 900 mg at bedtime.  She is having symptoms during the day.  She states she has never taken it as prescribed which is 300 mg three times per day.  Hypothyroidism: Patient is taking levothyroxine first thing in the morning without any other medications.  She has been taking appropriately since her labs were done 3 months ago.  Review of Systems  Constitutional: Negative for chills, fever, malaise/fatigue and weight loss.  HENT: Negative for congestion, ear discharge, ear pain, nosebleeds, sinus pain, sore throat and tinnitus.   Eyes: Negative for blurred vision, double vision, pain, discharge and redness.  Respiratory: Negative for cough, shortness of breath and wheezing.   Cardiovascular: Negative for chest pain, palpitations and leg swelling.  Gastrointestinal: Negative for abdominal pain, constipation, diarrhea, heartburn, nausea and vomiting.  Genitourinary: Negative for dysuria, frequency and urgency.  Musculoskeletal: Positive for joint pain. Negative for myalgias.  Skin: Negative for rash.  Neurological: Negative for dizziness, seizures, weakness and headaches.  Psychiatric/Behavioral: Negative for depression, substance abuse and suicidal ideas. The patient is not nervous/anxious.      Current Outpatient Medications:  .  albuterol (VENTOLIN HFA) 108 (90 Base) MCG/ACT inhaler, Inhale 2 puffs  into the lungs every 6 (six) hours as needed for wheezing or shortness of breath., Disp: 18 g, Rfl: 2 .  Cholecalciferol (VITAMIN D3) 125 MCG (5000 UT) TABS, Take 1 tablet (5,000 Units total) by mouth daily., Disp: 30 tablet, Rfl: 3 .  citalopram (CELEXA) 40 MG tablet, Take 1 tablet (40 mg total) by mouth daily., Disp: 90 tablet, Rfl: 1 .  cyanocobalamin (,VITAMIN B-12,) 1000 MCG/ML injection, Inject 1 mL (1,000 mcg total) into the muscle every 30 (thirty) days for 6 doses., Disp: 6 mL, Rfl: 0 .  diclofenac Sodium (VOLTAREN) 1 % GEL, See admin instructions., Disp: , Rfl:  .  ferrous sulfate 325 (65 FE) MG tablet, Take 325 mg by mouth daily with breakfast., Disp: , Rfl:  .  gabapentin (NEURONTIN) 300 MG capsule, Take 1 capsule (300 mg total) by mouth 3 (three) times daily., Disp: 90 capsule, Rfl: 5 .  glipiZIDE (GLUCOTROL XL) 5 MG 24 hr tablet, 1 tablet, Disp: , Rfl:  .  golimumab (SIMPONI ARIA) 50 MG/4ML SOLN injection, See admin instructions., Disp: , Rfl:  .  hydroxychloroquine (PLAQUENIL) 200 MG tablet, Take 200 mg by mouth daily. , Disp: , Rfl:  .  leflunomide (ARAVA) 20 MG tablet, Take 20 mg by mouth daily., Disp: , Rfl:  .  levothyroxine (SYNTHROID) 100 MCG tablet, Take 1 tablet (100 mcg total) by mouth every morning., Disp: 90 tablet, Rfl: 0 .  lisinopril (ZESTRIL) 10 MG tablet, Take 1 tablet (10 mg total) by mouth daily., Disp: 90 tablet, Rfl: 1 .  loperamide (IMODIUM A-D) 2 MG tablet, Take 1 tablet (2 mg total) by mouth in the morning. Take second dose if needed (Patient taking differently: Take 2 mg by mouth as needed. Take second dose if needed), Disp: 30 tablet, Rfl: 0 .  Pancrelipase, Lip-Prot-Amyl, (CREON) 24000-76000 units CPEP, Take 48,000 units (2 capsules) with meals and 24000 units (1 capsule) with snacks (Patient taking differently: Take 24,000-48,000 Units by mouth See admin instructions. Take 48,000 units (2 capsules) with meals and 24000 units (1 capsule) with snacks), Disp: 240  capsule, Rfl: 2 .  pantoprazole (PROTONIX) 40 MG tablet, TAKE ONE TABLET BY MOUTH DAILY BEFORE BREAKFAST, Disp: 90 tablet, Rfl: 1 .  potassium chloride (KLOR-CON) 10 MEQ tablet, 1 tablet with food, Disp: , Rfl:  .  potassium chloride SA (KLOR-CON) 20 MEQ tablet, Take 1 tablet (20 mEq total) by mouth daily., Disp: 90 tablet, Rfl: 1 .  pravastatin (PRAVACHOL) 40 MG tablet, Take 1 tablet (40 mg total) by mouth at bedtime., Disp: 90 tablet, Rfl: 1 .  predniSONE (DELTASONE) 5 MG tablet, Take 5 mg by mouth daily., Disp: , Rfl:  .  raloxifene (EVISTA) 60 MG tablet, 1 tablet, Disp: , Rfl:  .  Tocilizumab (ACTEMRA ACTPEN) 162 MG/0.9ML SOAJ, Inject the contents of 1 pen (124m) under the skin once a week as directed, Disp: , Rfl:  .  Tofacitinib Citrate ER (XELJANZ XR) 11 MG TB24, Take 1 tablet by mouth daily., Disp: , Rfl:  .  traZODone (DESYREL) 100 MG tablet, Take 1 tablet (100 mg total) by mouth at bedtime as needed for sleep., Disp: 90 tablet, Rfl: 1  Allergies  Allergen Reactions  . Adalimumab     Other reaction(s): no response  Past Medical History:  Diagnosis Date  . Aortic atherosclerosis ([place]) 11/13/2020  . CKD (chronic kidney disease) stage 2, GFR 60-89 ml/min   . COPD (chronic obstructive pulmonary disease) ([place])   . COVID-19 06/2019  . Depression   . Essential hypertension   . GERD (gastroesophageal reflux disease)   . History of diabetes mellitus   . Hyperlipidemia   . Hypothyroidism   . Neuropathy   . Rheumatoid arthritis ([place])   . Vitamin B12 deficiency   . Vitamin D deficiency     Past Surgical History:  Procedure Laterality Date  . BIOPSY  09/16/2019   Procedure: BIOPSY;  Surgeon: Daneil Dolin, MD;  [facility];  Service: Endoscopy;;  . BIOPSY  04/28/2020   Procedure: BIOPSY;  Surgeon: Daneil Dolin, MD;  [facility];  Service: Endoscopy;;  . BIOPSY  06/08/2020   Procedure: BIOPSY;  Surgeon: Daneil Dolin, MD;  [facility];   Service: Endoscopy;;  . CATARACT EXTRACTION Bilateral   . COLONOSCOPY  09/2010   Dr. Posey Pronto: mild diverticulsis in sigmoid colon.   . COLONOSCOPY WITH PROPOFOL N/A 09/16/2019   Rourk: Diverticulosis, random colon biopsies negative for microscopic colitis.  . COLONOSCOPY WITH PROPOFOL N/A 06/08/2020   Procedure: COLONOSCOPY WITH PROPOFOL;  Surgeon: Daneil Dolin, MD;  [facility];  Service: Endoscopy;  Laterality: N/A;  9:15am  . ESOPHAGOGASTRODUODENOSCOPY (EGD) WITH PROPOFOL N/A 04/28/2020   Procedure: ESOPHAGOGASTRODUODENOSCOPY (EGD) WITH PROPOFOL;  Surgeon: Daneil Dolin, MD;  [facility];  Service: Endoscopy;  Laterality: N/A;  2:15  . YAG LASER APPLICATION Left 32/99/2426   Procedure: YAG LASER APPLICATION;  Surgeon: Williams Che, MD;  Location: [facility];  Service: Ophthalmology;  Laterality: Left;    Family History  Problem Relation Age of Onset  . Stroke Mother   . Heart disease Mother   . Diabetes Mother   . Breast cancer Mother   . Other Father        stomach taken out for some reason  . Migraines Daughter   . Colon cancer Neg Hx     Social History   Socioeconomic History  . Marital status: Widowed    Spouse name: Not on file  . Number of children: 1  . Years of education: Not on file  . Highest education level: Not on file  Occupational History  . Occupation: DISABLED  Tobacco Use  . Smoking status: Former Smoker    Packs/day: 1.00    Years: 15.00    Pack years: 15.00    Types: Cigarettes    Quit date: 09/11/2017    Years since quitting: 3.4  . Smokeless tobacco: Former Network engineer  . Vaping Use: Never used  Substance and Sexual Activity  . Alcohol use: Never  . Drug use: Never  . Sexual activity: Not Currently  Other Topics Concern  . Not on file  Social History Narrative  . Not on file   Social Determinants of Health   Financial Resource Strain: Low Risk   . Difficulty of Paying Living Expenses: Not hard at all   Food Insecurity: No Food Insecurity  . Worried About Charity fundraiser in the Last Year: Never true  . Ran Out of Food in the Last Year: Never true  Transportation Needs: No Transportation Needs  . Lack of Transportation (Medical): No  . Lack of Transportation (Non-Medical): No  Physical Activity: Insufficiently Active  . Days  of Exercise per Week: 3 days  . Minutes of Exercise per Session: 30 min  Stress: No Stress Concern Present  . Feeling of Stress : Only a little  Social Connections: Socially Isolated  . Frequency of Communication with Friends and Family: More than three times a week  . Frequency of Social Gatherings with Friends and Family: More than three times a week  . Attends Religious Services: Never  . Active Member of Clubs or Organizations: No  . Attends Archivist Meetings: Never  . Marital Status: Widowed  Intimate Partner Violence: Not At Risk  . Fear of Current or Ex-Partner: No  . Emotionally Abused: No  . Physically Abused: No  . Sexually Abused: No      Objective:    BP 109/76   Pulse 90   Temp (!) 97.2 F (36.2 C) (Temporal)   Ht _0  (1.549 m)   Wt 115 lb 3.2 oz (52.3 kg)   SpO2 94%   BMI 21.77 kg/m   Wt Readings from Last 3 Encounters:  02/16/21 115 lb 3.2 oz (52.3 kg)  01/28/21 118 lb (53.5 kg)  12/31/20 116 lb (52.6 kg)    Physical Exam Vitals reviewed. Exam conducted with a chaperone present.  Constitutional:      General: She is not in acute distress.    Appearance: Normal appearance. She is not ill-appearing, toxic-appearing or diaphoretic.  HENT:     Head: Normocephalic and atraumatic.     Right Ear: Tympanic membrane, ear canal and external ear normal. There is no impacted cerumen.     Left Ear: Tympanic membrane, ear canal and external ear normal. There is no impacted cerumen.     Nose: Nose normal. No congestion or rhinorrhea.     Mouth/Throat:     Mouth: Mucous membranes are moist.     Pharynx: Oropharynx is  clear. No oropharyngeal exudate or posterior oropharyngeal erythema.  Eyes:     General: No scleral icterus.       Right eye: No discharge.        Left eye: No discharge.     Conjunctiva/sclera: Conjunctivae normal.     Pupils: Pupils are equal, round, and reactive to light.  Cardiovascular:     Rate and Rhythm: Normal rate and regular rhythm.     Heart sounds: Normal heart sounds. No murmur heard. No friction rub. No gallop.   Pulmonary:     Effort: Pulmonary effort is normal. No respiratory distress.     Breath sounds: Normal breath sounds. No stridor. No wheezing, rhonchi or rales.  Abdominal:     General: Abdomen is flat. Bowel sounds are normal. There is no distension.     Palpations: Abdomen is soft. There is no mass.     Tenderness: There is no abdominal tenderness. There is no guarding or rebound.     Hernia: No hernia is present.  Genitourinary:    Pubic Area: No rash or pubic lice.      Labia:        Right: No rash, tenderness, lesion or injury.        Left: No rash, tenderness, lesion or injury.      Vagina: Normal.     Cervix: Normal.     Uterus: Normal.      Adnexa: Right adnexa normal and left adnexa normal.  Musculoskeletal:        General: Normal range of motion.     Cervical back: Normal range of motion  and neck supple. No rigidity. No muscular tenderness.  Lymphadenopathy:     Cervical: No cervical adenopathy.  Skin:    General: Skin is warm and dry.     Capillary Refill: Capillary refill takes less than 2 seconds.  Neurological:     General: No focal deficit present.     Mental Status: She is alert and oriented to person, place, and time. Mental status is at baseline.  Psychiatric:        Mood and Affect: Mood normal.        Behavior: Behavior normal.        Thought Content: Thought content normal.        Judgment: Judgment normal.     Lab Results  Component Value Date   TSH 6.900 (H) 11/13/2020   Lab Results  Component Value Date   WBC 6.8  12/22/2020   HGB 9.7 (L) 12/22/2020   HCT 33.2 (L) 12/22/2020   MCV 100.3 (H) 12/22/2020   PLT 267 12/22/2020   Lab Results  Component Value Date   NA 138 12/22/2020   K 4.0 12/22/2020   CO2 26 12/22/2020   GLUCOSE 190 (H) 12/22/2020   BUN 13 12/22/2020   CREATININE 0.83 12/22/2020   BILITOT 0.4 12/22/2020   ALKPHOS 97 12/22/2020   AST 13 (L) 12/22/2020   ALT 9 12/22/2020   PROT 7.1 12/22/2020   ALBUMIN 3.4 (L) 12/22/2020   CALCIUM 8.6 (L) 12/22/2020   ANIONGAP 9 12/22/2020   Lab Results  Component Value Date   CHOL 201 (H) 11/13/2020   Lab Results  Component Value Date   HDL 82 11/13/2020   Lab Results  Component Value Date   LDLCALC 96 11/13/2020   Lab Results  Component Value Date   TRIG 136 11/13/2020   Lab Results  Component Value Date   CHOLHDL 2.5 11/13/2020   Lab Results  Component Value Date   HGBA1C 6.4 11/13/2020

## 2021-02-16 NOTE — Patient Instructions (Addendum)
Check the price of the shingles vaccine (Shingrix) at your pharmacy.  Take your gabapentin as prescribed.   Start taking a baby aspirin daily (81 mg)   Preventive Care 60-64 Years Old, Female Preventive care refers to lifestyle choices and visits with your health care provider that can promote health and wellness. This includes:  A yearly physical exam. This is also called an annual wellness visit.  Regular dental and eye exams.  Immunizations.  Screening for certain conditions.  Healthy lifestyle choices, such as: ? Eating a healthy diet. ? Getting regular exercise. ? Not using drugs or products that contain nicotine and tobacco. ? Limiting alcohol use. What can I expect for my preventive care visit? Physical exam Your health care provider will check your:  Height and weight. These may be used to calculate your BMI (body mass index). BMI is a measurement that tells if you are at a healthy weight.  Heart rate and blood pressure.  Body temperature.  Skin for abnormal spots. Counseling Your health care provider may ask you questions about your:  Past medical problems.  Family's medical history.  Alcohol, tobacco, and drug use.  Emotional well-being.  Home life and relationship well-being.  Sexual activity.  Diet, exercise, and sleep habits.  Work and work Statistician.  Access to firearms.  Method of birth control.  Menstrual cycle.  Pregnancy history. What immunizations do I need? Vaccines are usually given at various ages, according to a schedule. Your health care provider will recommend vaccines for you based on your age, medical history, and lifestyle or other factors, such as travel or where you work.   What tests do I need? Blood tests  Lipid and cholesterol levels. These may be checked every 5 years, or more often if you are over 58 years old.  Hepatitis C test.  Hepatitis B test. Screening  Lung cancer screening. You may have this screening  every year starting at age 65 if you have a 30-pack-year history of smoking and currently smoke or have quit within the past 15 years.  Colorectal cancer screening. ? All adults should have this screening starting at age 64 and continuing until age 31. ? Your health care provider may recommend screening at age 7 if you are at increased risk. ? You will have tests every 1-10 years, depending on your results and the type of screening test.  Diabetes screening. ? This is done by checking your blood sugar (glucose) after you have not eaten for a while (fasting). ? You may have this done every 1-3 years.  Mammogram. ? This may be done every 1-2 years. ? Talk with your health care provider about when you should start having regular mammograms. This may depend on whether you have a family history of breast cancer.  BRCA-related cancer screening. This may be done if you have a family history of breast, ovarian, tubal, or peritoneal cancers.  Pelvic exam and Pap test. ? This may be done every 3 years starting at age 63. ? Starting at age 35, this may be done every 5 years if you have a Pap test in combination with an HPV test. Other tests  STD (sexually transmitted disease) testing, if you are at risk.  Bone density scan. This is done to screen for osteoporosis. You may have this scan if you are at high risk for osteoporosis. Talk with your health care provider about your test results, treatment options, and if necessary, the need for more tests. Follow these instructions  at home: Eating and drinking  Eat a diet that includes fresh fruits and vegetables, whole grains, lean protein, and low-fat dairy products.  Take vitamin and mineral supplements as recommended by your health care provider.  Do not drink alcohol if: ? Your health care provider tells you not to drink. ? You are pregnant, may be pregnant, or are planning to become pregnant.  If you drink alcohol: ? Limit how much you have  to 0-1 drink a day. ? Be aware of how much alcohol is in your drink. In the [place] one drink equals one 12 oz bottle of beer (355 mL), one 5 oz glass of wine (148 mL), or one 1 oz glass of hard liquor (44 mL).   Lifestyle  Take daily care of your teeth and gums. Brush your teeth every morning and night with fluoride toothpaste. Floss one time each day.  Stay active. Exercise for at least 30 minutes 5 or more days each week.  Do not use any products that contain nicotine or tobacco, such as cigarettes, e-cigarettes, and chewing tobacco. If you need help quitting, ask your health care provider.  Do not use drugs.  If you are sexually active, practice safe sex. Use a condom or other form of protection to prevent STIs (sexually transmitted infections).  If you do not wish to become pregnant, use a form of birth control. If you plan to become pregnant, see your health care provider for a prepregnancy visit.  If told by your health care provider, take low-dose aspirin daily starting at age 1.  Find healthy ways to cope with stress, such as: ? Meditation, yoga, or listening to music. ? Journaling. ? Talking to a trusted person. ? Spending time with friends and family. Safety  Always wear your seat belt while driving or riding in a vehicle.  Do not drive: ? If you have been drinking alcohol. Do not ride with someone who has been drinking. ? When you are tired or distracted. ? While texting.  Wear a helmet and other protective equipment during sports activities.  If you have firearms in your house, make sure you follow all gun safety procedures. What's next?  Visit your health care provider once a year for an annual wellness visit.  Ask your health care provider how often you should have your eyes and teeth checked.  Stay up to date on all vaccines. This information is not intended to replace advice given to you by your health care provider. Make sure you discuss any questions you  have with your health care provider. Document Revised: 06/16/2020 Document Reviewed: 05/24/2018 Elsevier Patient Education  2021 [employer].

## 2021-02-17 ENCOUNTER — Other Ambulatory Visit: Payer: Self-pay | Admitting: Family Medicine

## 2021-02-17 DIAGNOSIS — E039 Hypothyroidism, unspecified: Secondary | ICD-10-CM

## 2021-02-17 LAB — CMP14+EGFR
ALT: 6 IU/L (ref 0–32)
AST: 6 IU/L (ref 0–40)
Albumin/Globulin Ratio: 1.1 — ABNORMAL LOW (ref 1.2–2.2)
Albumin: 3.7 g/dL — ABNORMAL LOW (ref 3.8–4.8)
Alkaline Phosphatase: 136 IU/L — ABNORMAL HIGH (ref 44–121)
BUN/Creatinine Ratio: 10 — ABNORMAL LOW (ref 12–28)
BUN: 10 mg/dL (ref 8–27)
Bilirubin Total: <0.2 mg/dL (ref 0.0–1.2)
CO2: 25 mmol/L (ref 20–29)
Calcium: 9.5 mg/dL (ref 8.7–10.3)
Chloride: 99 mmol/L (ref 96–106)
Creatinine, Ser: 1.01 mg/dL — ABNORMAL HIGH (ref 0.57–1.00)
Globulin, Total: 3.3 g/dL (ref 1.5–4.5)
Glucose: 169 mg/dL — ABNORMAL HIGH (ref 65–99)
Potassium: 4 mmol/L (ref 3.5–5.2)
Sodium: 139 mmol/L (ref 134–144)
Total Protein: 7 g/dL (ref 6.0–8.5)
eGFR: 62 mL/min/{1.73_m2} (ref >59)

## 2021-02-17 LAB — T4, FREE: Free T4: 1.43 ng/dL (ref 0.82–1.77)

## 2021-02-17 LAB — CYTOLOGY - PAP
Chlamydia: NEGATIVE
Comment: NEGATIVE
Comment: NEGATIVE
Comment: NEGATIVE
Comment: NORMAL
Diagnosis: NEGATIVE
High risk HPV: NEGATIVE
Neisseria Gonorrhea: NEGATIVE
Trichomonas: NEGATIVE

## 2021-02-17 LAB — TSH: TSH: 4.52 u[IU]/mL — ABNORMAL HIGH (ref 0.450–4.500)

## 2021-02-17 MED ORDER — LEVOTHYROXINE SODIUM 100 MCG PO TABS: 100.0000 ug | ORAL_TABLET | Freq: Every morning | ORAL | 1 refills | Status: DC

## 2021-03-09 ENCOUNTER — Inpatient Hospital Stay (HOSPITAL_COMMUNITY): Payer: 59 | Attending: Hematology

## 2021-03-09 ENCOUNTER — Other Ambulatory Visit: Payer: Self-pay

## 2021-03-09 DIAGNOSIS — R803 Bence Jones proteinuria: Secondary | ICD-10-CM | POA: Insufficient documentation

## 2021-03-09 DIAGNOSIS — M069 Rheumatoid arthritis, unspecified: Secondary | ICD-10-CM | POA: Diagnosis not present

## 2021-03-09 DIAGNOSIS — D472 Monoclonal gammopathy: Secondary | ICD-10-CM

## 2021-03-09 DIAGNOSIS — D649 Anemia, unspecified: Secondary | ICD-10-CM | POA: Insufficient documentation

## 2021-03-09 DIAGNOSIS — R112 Nausea with vomiting, unspecified: Secondary | ICD-10-CM | POA: Insufficient documentation

## 2021-03-09 DIAGNOSIS — E876 Hypokalemia: Secondary | ICD-10-CM | POA: Diagnosis not present

## 2021-03-09 DIAGNOSIS — R634 Abnormal weight loss: Secondary | ICD-10-CM | POA: Diagnosis not present

## 2021-03-09 DIAGNOSIS — Z87891 Personal history of nicotine dependence: Secondary | ICD-10-CM | POA: Diagnosis not present

## 2021-03-09 DIAGNOSIS — Z79899 Other long term (current) drug therapy: Secondary | ICD-10-CM | POA: Diagnosis not present

## 2021-03-09 DIAGNOSIS — F32A Depression, unspecified: Secondary | ICD-10-CM | POA: Insufficient documentation

## 2021-03-09 LAB — CBC WITH DIFFERENTIAL/PLATELET
Abs Immature Granulocytes: 0.03 10*3/uL (ref 0.00–0.07)
Basophils Absolute: 0 10*3/uL (ref 0.0–0.1)
Basophils Relative: 0 %
Eosinophils Absolute: 0.1 10*3/uL (ref 0.0–0.5)
Eosinophils Relative: 2 %
HCT: 30.3 % — ABNORMAL LOW (ref 36.0–46.0)
Hemoglobin: 9.1 g/dL — ABNORMAL LOW (ref 12.0–15.0)
Immature Granulocytes: 1 %
Lymphocytes Relative: 23 %
Lymphs Abs: 1.4 10*3/uL (ref 0.7–4.0)
MCH: 28.1 pg (ref 26.0–34.0)
MCHC: 30 g/dL (ref 30.0–36.0)
MCV: 93.5 fL (ref 80.0–100.0)
Monocytes Absolute: 0.7 10*3/uL (ref 0.1–1.0)
Monocytes Relative: 11 %
Neutro Abs: 4 10*3/uL (ref 1.7–7.7)
Neutrophils Relative %: 63 %
Platelets: 371 10*3/uL (ref 150–400)
RBC: 3.24 MIL/uL — ABNORMAL LOW (ref 3.87–5.11)
RDW: 14.5 % (ref 11.5–15.5)
WBC: 6.2 10*3/uL (ref 4.0–10.5)
nRBC: 0 % (ref 0.0–0.2)

## 2021-03-09 LAB — IRON AND TIBC
Iron: 27 ug/dL — ABNORMAL LOW (ref 28–170)
Saturation Ratios: 15 % (ref 10.4–31.8)
TIBC: 184 ug/dL — ABNORMAL LOW (ref 250–450)
UIBC: 157 ug/dL

## 2021-03-09 LAB — FERRITIN: Ferritin: 742 ng/mL — ABNORMAL HIGH (ref 11–307)

## 2021-03-09 LAB — VITAMIN B12: Vitamin B-12: 2698 pg/mL — ABNORMAL HIGH (ref 180–914)

## 2021-03-16 ENCOUNTER — Other Ambulatory Visit: Payer: Self-pay

## 2021-03-16 ENCOUNTER — Inpatient Hospital Stay (HOSPITAL_BASED_OUTPATIENT_CLINIC_OR_DEPARTMENT_OTHER): Payer: 59 | Admitting: Hematology and Oncology

## 2021-03-16 VITALS — BP 134/75 | HR 85 | Temp 96.9°F | Resp 18 | Wt 110.6 lb

## 2021-03-16 DIAGNOSIS — D472 Monoclonal gammopathy: Secondary | ICD-10-CM | POA: Diagnosis not present

## 2021-03-16 DIAGNOSIS — R803 Bence Jones proteinuria: Secondary | ICD-10-CM | POA: Diagnosis not present

## 2021-03-16 NOTE — Progress Notes (Signed)
[place],  [id number]   CLINIC:  Medical Oncology/Hematology  PCP:  Loman Brooklyn, [place] / [place] [address]  [phone]  REASON FOR VISIT:  Follow-up for monoclonal gammopathy & normocytic anemia  PRIOR THERAPY: None  CURRENT THERAPY: Observation  INTERVAL HISTORY:  [name], a 65 y.o. female, returns for routine follow-up for her monoclonal gammopathy and normocytic anemia. Erica Beasley was last seen on 12/29/2020.   On exam today Erica Beasley reports that she has been well in the interim since her last visit.  She does occasionally have some issues with fatigue and feeling chills and cold but otherwise denies having shortness of breath.  She notes that her RA is not currently well controlled and that she has been receiving infusions in order to try to help better control this.  She does that she is not having any signs of bleeding, bruising, or dark stools.  She has had for the last 3 weeks has been having some issues with nausea and does not like the smell of food.  She reports that she is "now back to eating".  She is also having some chronic pain in her hip.  A full 10 point ROS is listed below.   REVIEW OF SYSTEMS:  Review of Systems  Constitutional:  Positive for appetite change (50%) and fatigue (50%).  Cardiovascular:  Positive for chest pain (occasional).  Gastrointestinal:  Positive for diarrhea (since 11/10). Negative for blood in stool.  Neurological:  Positive for numbness (hands).  All other systems reviewed and are negative.  PAST MEDICAL/SURGICAL HISTORY:  Past Medical History:  Diagnosis Date   Aortic atherosclerosis ([facility]) 11/13/2020   CKD (chronic kidney disease) stage 2, GFR 60-89 ml/min    COPD (chronic obstructive pulmonary disease) ([facility])    COVID-19 06/2019   Depression    Essential hypertension    GERD (gastroesophageal reflux disease)    History of diabetes mellitus    Hyperlipidemia     Hypothyroidism    Neuropathy    Rheumatoid arthritis ([facility])    Vitamin B12 deficiency    Vitamin D deficiency    Past Surgical History:  Procedure Laterality Date   BIOPSY  09/16/2019   Procedure: BIOPSY;  Surgeon: Daneil Dolin, MD;  [facility];  Service: Endoscopy;;   BIOPSY  04/28/2020   Procedure: BIOPSY;  Surgeon: Daneil Dolin, MD;  [facility];  Service: Endoscopy;;   BIOPSY  06/08/2020   Procedure: BIOPSY;  Surgeon: Daneil Dolin, MD;  [facility];  Service: Endoscopy;;   CATARACT EXTRACTION Bilateral    COLONOSCOPY  09/2010   Dr. Posey Pronto: mild diverticulsis in sigmoid colon.    COLONOSCOPY WITH PROPOFOL N/A 09/16/2019   Rourk: Diverticulosis, random colon biopsies negative for microscopic colitis.   COLONOSCOPY WITH PROPOFOL N/A 06/08/2020   Procedure: COLONOSCOPY WITH PROPOFOL;  Surgeon: Daneil Dolin, MD;  [facility];  Service: Endoscopy;  Laterality: N/A;  9:15am   ESOPHAGOGASTRODUODENOSCOPY (EGD) WITH PROPOFOL N/A 04/28/2020   Procedure: ESOPHAGOGASTRODUODENOSCOPY (EGD) WITH PROPOFOL;  Surgeon: Daneil Dolin, MD;  [facility];  Service: Endoscopy;  Laterality: N/A;  3:14   YAG LASER APPLICATION Left 97/10/6376   Procedure: YAG LASER APPLICATION;  Surgeon: Williams Che, MD;  Location: [facility];  Service: Ophthalmology;  Laterality: Left;    SOCIAL HISTORY:  Social History   Socioeconomic History  Marital status: Widowed    Spouse name: Not on file   Number of children: 1   Years of education: Not on file   Highest education level: Not on file  Occupational History   Occupation: DISABLED  Tobacco Use   Smoking status: Former    Packs/day: 1.00    Years: 15.00    Pack years: 15.00    Types: Cigarettes    Quit date: 09/11/2017    Years since quitting: 3.5   Smokeless tobacco: Former  Scientific laboratory technician Use: Never used  Substance and Sexual Activity   Alcohol use: Never   Drug use: Never    Sexual activity: Not Currently  Other Topics Concern   Not on file  Social History Narrative   Not on file   Social Determinants of Health   Financial Resource Strain: Low Risk    Difficulty of Paying Living Expenses: Not hard at all  Food Insecurity: No Food Insecurity   Worried About Charity fundraiser in the Last Year: Never true   [place] in the Last Year: Never true  Transportation Needs: No Transportation Needs   Lack of Transportation (Medical): No   Lack of Transportation (Non-Medical): No  Physical Activity: Insufficiently Active   Days of Exercise per Week: 3 days   Minutes of Exercise per Session: 30 min  Stress: No Stress Concern Present   Feeling of Stress : Only a little  Social Connections: Socially Isolated   Frequency of Communication with Friends and Family: More than three times a week   Frequency of Social Gatherings with Friends and Family: More than three times a week   Attends Religious Services: Never   Marine scientist or Organizations: No   Attends Archivist Meetings: Never   Marital Status: Widowed  Human resources officer Violence: Not At Risk   Fear of Current or Ex-Partner: No   Emotionally Abused: No   Physically Abused: No   Sexually Abused: No    FAMILY HISTORY:  Family History  Problem Relation Age of Onset   Stroke Mother    Heart disease Mother    Diabetes Mother    Breast cancer Mother    Other Father        stomach taken out for some reason   Migraines Daughter    Colon cancer Neg Hx     CURRENT MEDICATIONS:  Current Outpatient Medications  Medication Sig Dispense Refill   albuterol (VENTOLIN HFA) 108 (90 Base) MCG/ACT inhaler Inhale 2 puffs into the lungs every 6 (six) hours as needed for wheezing or shortness of breath. 18 g 2   aspirin 81 MG EC tablet Take 1 tablet (81 mg total) by mouth daily. Swallow whole. 30 tablet 12   Cholecalciferol (VITAMIN D3) 125 MCG (5000 UT) TABS Take 1 tablet (5,000 Units  total) by mouth daily. 30 tablet 3   citalopram (CELEXA) 40 MG tablet Take 1 tablet (40 mg total) by mouth daily. 90 tablet 1   cyanocobalamin (,VITAMIN B-12,) 1000 MCG/ML injection Inject 1 mL (1,000 mcg total) into the muscle every 30 (thirty) days for 6 doses. 6 mL 0   diclofenac Sodium (VOLTAREN) 1 % GEL See admin instructions.     ferrous sulfate 325 (65 FE) MG tablet Take 325 mg by mouth daily with breakfast.     gabapentin (NEURONTIN) 300 MG capsule Take 1 capsule (300 mg total) by mouth 3 (three) times daily. (Patient taking differently: Take  900 mg by mouth at bedtime.) 90 capsule 5   glipiZIDE (GLUCOTROL XL) 5 MG 24 hr tablet 1 tablet     golimumab (SIMPONI ARIA) 50 MG/4ML SOLN injection See admin instructions.     hydroxychloroquine (PLAQUENIL) 200 MG tablet Take 200 mg by mouth daily.      leflunomide (ARAVA) 20 MG tablet Take 20 mg by mouth daily.     levothyroxine (SYNTHROID) 100 MCG tablet Take 1 tablet (100 mcg total) by mouth every morning. 90 tablet 1   lisinopril (ZESTRIL) 10 MG tablet Take 1 tablet (10 mg total) by mouth daily. (Patient taking differently: Take 5 mg by mouth daily.) 90 tablet 1   loperamide (IMODIUM A-D) 2 MG tablet Take 1 tablet (2 mg total) by mouth in the morning. Take second dose if needed (Patient taking differently: Take 2 mg by mouth as needed. Take second dose if needed) 30 tablet 0   Pancrelipase, Lip-Prot-Amyl, (CREON) 24000-76000 units CPEP Take 1-2 capsules (24,000-48,000 Units total) by mouth See admin instructions. Take 48,000 units (2 capsules) with meals and 24000 units (1 capsule) with snacks 240 capsule 3   pantoprazole (PROTONIX) 40 MG tablet TAKE ONE TABLET BY MOUTH DAILY BEFORE BREAKFAST 90 tablet 1   potassium chloride (KLOR-CON) 10 MEQ tablet 1 tablet with food     potassium chloride SA (KLOR-CON) 20 MEQ tablet Take 1 tablet (20 mEq total) by mouth daily. 90 tablet 1   pravastatin (PRAVACHOL) 40 MG tablet Take 1 tablet (40 mg total) by  mouth at bedtime. 90 tablet 1   predniSONE (DELTASONE) 5 MG tablet Take 5 mg by mouth daily.     raloxifene (EVISTA) 60 MG tablet 1 tablet     Tocilizumab (ACTEMRA ACTPEN) 162 MG/0.9ML SOAJ Inject the contents of 1 pen (162mg ) under the skin once a week as directed     Tofacitinib Citrate ER (XELJANZ XR) 11 MG TB24 Take 1 tablet by mouth daily.     traZODone (DESYREL) 100 MG tablet Take 1 tablet (100 mg total) by mouth at bedtime as needed for sleep. 90 tablet 1   No current facility-administered medications for this visit.    ALLERGIES:  Allergies  Allergen Reactions   Adalimumab     Other reaction(s): no response    PHYSICAL EXAM:  Performance status (ECOG): 1 - Symptomatic but completely ambulatory  Vitals:   03/16/21 1021  BP: 134/75  Pulse: 85  Resp: 18  Temp: (!) 96.9 F (36.1 C)  SpO2: 100%   Wt Readings from Last 3 Encounters:  03/16/21 110 lb 9.6 oz (50.2 kg)  02/16/21 115 lb 3.2 oz (52.3 kg)  01/28/21 118 lb (53.5 kg)   Physical Exam Vitals reviewed.  Constitutional:      Appearance: Normal appearance.  Cardiovascular:     Rate and Rhythm: Normal rate and regular rhythm.     Pulses: Normal pulses.     Heart sounds: Normal heart sounds.  Pulmonary:     Effort: Pulmonary effort is normal.     Breath sounds: Normal breath sounds.  Abdominal:     Palpations: Abdomen is soft. There is no mass.     Tenderness: There is no abdominal tenderness.  Musculoskeletal:     Right lower leg: No edema.     Left lower leg: No edema.  Neurological:     General: No focal deficit present.     Mental Status: She is alert and oriented to person, place, and time.  Psychiatric:  Mood and Affect: Mood normal.        Behavior: Behavior normal.    LABORATORY DATA:  I have reviewed the labs as listed.  CBC Latest Ref Rng & Units 03/09/2021 12/22/2020 11/17/2020  WBC 4.0 - 10.5 K/uL 6.2 6.8 6.4  Hemoglobin 12.0 - 15.0 g/dL 9.1(L) 9.7(L) 9.9(L)  Hematocrit 36.0 - 46.0 %  30.3(L) 33.2(L) 32.9(L)  Platelets 150 - 400 K/uL 371 267 314   CMP Latest Ref Rng & Units 02/16/2021 12/22/2020 11/17/2020  Glucose 65 - 99 mg/dL 169(H) 190(H) 111(H)  BUN 8 - 27 mg/dL 10 13 13   Creatinine 0.57 - 1.00 mg/dL 1.01(H) 0.83 0.92  Sodium 134 - 144 mmol/L 139 138 132(L)  Potassium 3.5 - 5.2 mmol/L 4.0 4.0 3.9  Chloride 96 - 106 mmol/L 99 103 98  CO2 20 - 29 mmol/L 25 26 26   Calcium 8.7 - 10.3 mg/dL 9.5 8.6(L) 8.5(L)  Total Protein 6.0 - 8.5 g/dL 7.0 7.1 7.3  Total Bilirubin 0.0 - 1.2 mg/dL <0.2 0.4 0.4  Alkaline Phos 44 - 121 IU/L 136(H) 97 91  AST 0 - 40 IU/L 6 13(L) 13(L)  ALT 0 - 32 IU/L 6 9 9       Component Value Date/Time   RBC 3.24 (L) 03/09/2021 1331   MCV 93.5 03/09/2021 1331   MCV 93 11/13/2020 1129   MCH 28.1 03/09/2021 1331   MCHC 30.0 03/09/2021 1331   RDW 14.5 03/09/2021 1331   RDW 12.7 11/13/2020 1129   LYMPHSABS 1.4 03/09/2021 1331   LYMPHSABS 2.4 11/13/2020 1129   MONOABS 0.7 03/09/2021 1331   EOSABS 0.1 03/09/2021 1331   EOSABS 0.1 11/13/2020 1129   BASOSABS 0.0 03/09/2021 1331   BASOSABS 0.0 11/13/2020 1129   Lab Results  Component Value Date   LDH 176 09/30/2020   LDH 199 (H) 09/03/2020   LDH 178 08/13/2020    DIAGNOSTIC IMAGING:  I have independently reviewed the scans and discussed with the patient. No results found.    ASSESSMENT:  1.  Bence-Jones proteinuria: -24-hour urine done by Dr. Theador Hawthorne showed urine immunofixation positive for Bence-Jones proteinuria, kappa type.  24-hour total protein was 77 mg/day. -Free kappa light chains were 52.9, lambda light chains 29.8 with ratio of 1.78.  Serum immunofixation was unremarkable.  SPEP did not show any evidence of M spike. -Labs on 04/03/2020 shows negative immunofixation.  Kappa light chain 61.3, lambda light chains 32.3, ratio 1.9. -Skeletal survey on 04/03/2020 was negative for lytic lesions.   2.  Weight loss: -25 pound weight loss in the last 2 months. -She smoked cigarettes 1  pack/day for 46 years, and quit 3 years ago. -She reports that she cannot eat due to decreased appetite.  Also feels very nauseous on smelling of foods. -EGD on 04/28/2020 with stomach and duodenal biopsy negative. -CT CAP on 05/11/2020 showed long segment colonic thickening likely diverticular disease.  Stable small juxtapleural nodule in the right upper chest benign over 1 year.  Moderate large fat-containing left inguinal hernia.  Emphysema and diverticular calcification. -Colonoscopy on 06/08/2020 shows pancolonic diverticulosis, mild.  Normal-appearing colonic mucosa otherwise.  Random biopsies were negative.   3.  Normocytic anemia: -CBC on 03/20/2020 shows hemoglobin 9.9 with MCV of 93.  White count and platelets were normal. -Colonoscopy on 09/16/2019 shows diverticulosis in the entire examined colon.  Nonbleeding internal hemorrhoids.   4.  Rheumatoid arthritis: -She is on Plaquenil and Areva for the last 4 years.   PLAN:  1.  Bence-Jones proteinuria: -Creatinine is 1.01.  Calcium is 9.5. -myeloma panel in Feb 2022 appears stable from prior   2.  Weight loss: -Continue Megace 400 mg twice daily. -She is having more diarrhea.  She is being evaluated by GI. -She is on pancrelipase 2 capsules with meals and 1 capsule with snack.   3.  Normocytic anemia: -Labs consistent with anemia from chronic inflammation. --Labs today show a decreased total iron binding capacity with elevated ferritin.  Iron saturation of 15%.  These findings are most consistent with anemia of chronic inflammation, likely from her RA. --No specific intervention required at this time. -Hgb today at 9.1, decreased from prior -She denies any bleeding per rectum or melena.   4.  Depression: -Continue Celexa 40 mg daily.  5.  Hypokalemia: Potassium on 02/17/2021 was 4.0. Not rechecked today  Orders placed this encounter:  No orders of the defined types were placed in this encounter.   Ledell Peoples,  MD Department of Hematology/Oncology [place] [facility] Phone: [phone] Pager: [phone] Email: [email or web address]

## 2021-03-17 ENCOUNTER — Other Ambulatory Visit (HOSPITAL_COMMUNITY): Payer: Self-pay

## 2021-03-17 DIAGNOSIS — D649 Anemia, unspecified: Secondary | ICD-10-CM

## 2021-03-17 DIAGNOSIS — D472 Monoclonal gammopathy: Secondary | ICD-10-CM

## 2021-03-25 ENCOUNTER — Ambulatory Visit (HOSPITAL_COMMUNITY)
Admission: RE | Admit: 2021-03-25 | Discharge: 2021-03-25 | Disposition: A | Discharge status: Home / Self Care | Payer: 59 | Source: Ambulatory Visit | Attending: Physician Assistant | Admitting: Physician Assistant

## 2021-03-25 ENCOUNTER — Other Ambulatory Visit: Payer: Self-pay

## 2021-03-25 DIAGNOSIS — M0579 Rheumatoid arthritis with rheumatoid factor of multiple sites without organ or systems involvement: Secondary | ICD-10-CM | POA: Insufficient documentation

## 2021-03-25 MED ORDER — ACETAMINOPHEN 500 MG PO TABS: 1000.0000 mg | ORAL_TABLET | Freq: Once | ORAL | Status: DC

## 2021-03-25 MED ORDER — FAMOTIDINE 20 MG PO TABS: 20.0000 mg | ORAL_TABLET | Freq: Once | ORAL | Status: DC

## 2021-03-25 MED ORDER — SODIUM CHLORIDE 0.9 % IV SOLN
2.0000 mg/kg | Freq: Once | INTRAVENOUS | Status: DC
  Administered 2021-03-25: 105 mg via INTRAVENOUS
  Filled 2021-03-25: qty 8.4

## 2021-04-06 ENCOUNTER — Other Ambulatory Visit: Payer: Self-pay

## 2021-04-06 ENCOUNTER — Encounter: Payer: Self-pay | Admitting: Family Medicine

## 2021-04-06 ENCOUNTER — Ambulatory Visit (INDEPENDENT_AMBULATORY_CARE_PROVIDER_SITE_OTHER): Payer: 59 | Admitting: Family Medicine

## 2021-04-06 ENCOUNTER — Other Ambulatory Visit: Payer: Self-pay | Admitting: Family Medicine

## 2021-04-06 VITALS — BP 144/90 | HR 77 | Temp 97.3°F | Ht 61.0 in | Wt 116.6 lb

## 2021-04-06 DIAGNOSIS — M5441 Lumbago with sciatica, right side: Secondary | ICD-10-CM | POA: Diagnosis not present

## 2021-04-06 MED ORDER — MELOXICAM 7.5 MG PO TABS: 7.5000 mg | ORAL_TABLET | Freq: Every day | ORAL | 0 refills | Status: DC

## 2021-04-06 MED ORDER — METHYLPREDNISOLONE ACETATE 40 MG/ML IJ SUSP
40.0000 mg | Freq: Once | INTRAMUSCULAR | Status: AC
  Administered 2021-04-06: 80 mg via INTRAMUSCULAR

## 2021-04-06 NOTE — Telephone Encounter (Signed)
Her levels were no longer low last month.  I recommend she stop the injections and start an oral over-the-counter supplement of vitamin B12 1,000 mcg/day.

## 2021-04-06 NOTE — Progress Notes (Signed)
Assessment & Plan:  1. Acute right-sided low back pain with right-sided sciatica Education provided on back pain.  Discussed with patient that her pain is coming from her low back, and not her hip based on her physical assessment.  Depo-Medrol injection given in office.  Patient started on meloxicam once daily.  Encouraged heat and muscle rub. - methylPREDNISolone acetate (DEPO-MEDROL) injection 40 mg - meloxicam (MOBIC) 7.5 MG tablet; Take 1 tablet (7.5 mg total) by mouth daily.  Dispense: 30 tablet; Refill: 0   Follow up plan: Return if symptoms worsen or fail to improve.  Hendricks Limes, MSN, APRN, FNP-C [facility]  Subjective:   Patient ID: Erica Beasley, female    DOB: [birth year], 65 y.o.   MRN: [id number]  HPI: Erica Beasley is a 65 y.o. female presenting on 04/06/2021 for Hip Pain (Patient states its been going on since July 3rd)  Patient reports right hip pain 9 days.  Prior to the onset of pain she had just completed a course of prednisone prescribed by her rheumatologist.  States if she is sitting or lying the pain resolves.  It hurts when she is up walking.  Denies any falls or injuries.  She has been taking ibuprofen 800 mg and Tylenol 1000 mg, which have not been helpful.   ROS: Negative unless specifically indicated above in HPI.   Relevant past medical history reviewed and updated as indicated.   Allergies and medications reviewed and updated.   Current Outpatient Medications:    albuterol (VENTOLIN HFA) 108 (90 Base) MCG/ACT inhaler, Inhale 2 puffs into the lungs every 6 (six) hours as needed for wheezing or shortness of breath., Disp: 18 g, Rfl: 2   aspirin 81 MG EC tablet, Take 1 tablet (81 mg total) by mouth daily. Swallow whole., Disp: 30 tablet, Rfl: 12   Cholecalciferol (VITAMIN D3) 125 MCG (5000 UT) TABS, Take 1 tablet (5,000 Units total) by mouth daily., Disp: 30 tablet, Rfl: 3   citalopram (CELEXA) 40 MG tablet, Take 1 tablet (40 mg  total) by mouth daily., Disp: 90 tablet, Rfl: 1   diclofenac Sodium (VOLTAREN) 1 % GEL, See admin instructions., Disp: , Rfl:    ferrous sulfate 325 (65 FE) MG tablet, Take 325 mg by mouth daily with breakfast., Disp: , Rfl:    gabapentin (NEURONTIN) 300 MG capsule, Take 1 capsule (300 mg total) by mouth 3 (three) times daily. (Patient taking differently: Take 900 mg by mouth at bedtime.), Disp: 90 capsule, Rfl: 5   glipiZIDE (GLUCOTROL XL) 5 MG 24 hr tablet, 1 tablet, Disp: , Rfl:    golimumab (SIMPONI ARIA) 50 MG/4ML SOLN injection, See admin instructions., Disp: , Rfl:    hydroxychloroquine (PLAQUENIL) 200 MG tablet, Take 200 mg by mouth daily. , Disp: , Rfl:    leflunomide (ARAVA) 20 MG tablet, Take 20 mg by mouth daily., Disp: , Rfl:    levothyroxine (SYNTHROID) 100 MCG tablet, Take 1 tablet (100 mcg total) by mouth every morning., Disp: 90 tablet, Rfl: 1   lisinopril (ZESTRIL) 10 MG tablet, Take 1 tablet (10 mg total) by mouth daily. (Patient taking differently: Take 5 mg by mouth daily.), Disp: 90 tablet, Rfl: 1   loperamide (IMODIUM A-D) 2 MG tablet, Take 1 tablet (2 mg total) by mouth in the morning. Take second dose if needed (Patient taking differently: Take 2 mg by mouth as needed. Take second dose if needed), Disp: 30 tablet, Rfl: 0   Pancrelipase, Lip-Prot-Amyl, (  CREON) 24000-76000 units CPEP, Take 1-2 capsules (24,000-48,000 Units total) by mouth See admin instructions. Take 48,000 units (2 capsules) with meals and 24000 units (1 capsule) with snacks, Disp: 240 capsule, Rfl: 3   pantoprazole (PROTONIX) 40 MG tablet, TAKE ONE TABLET BY MOUTH DAILY BEFORE BREAKFAST, Disp: 90 tablet, Rfl: 1   potassium chloride (KLOR-CON) 10 MEQ tablet, 1 tablet with food, Disp: , Rfl:    potassium chloride SA (KLOR-CON) 20 MEQ tablet, Take 1 tablet (20 mEq total) by mouth daily., Disp: 90 tablet, Rfl: 1   pravastatin (PRAVACHOL) 40 MG tablet, Take 1 tablet (40 mg total) by mouth at bedtime., Disp: 90  tablet, Rfl: 1   raloxifene (EVISTA) 60 MG tablet, 1 tablet, Disp: , Rfl:    sulfaDIAZINE 500 MG tablet, Take 500 mg by mouth 2 (two) times daily., Disp: , Rfl:    Tocilizumab (ACTEMRA ACTPEN) 162 MG/0.9ML SOAJ, Inject the contents of 1 pen (162mg ) under the skin once a week as directed, Disp: , Rfl:    Tofacitinib Citrate ER (XELJANZ XR) 11 MG TB24, Take 1 tablet by mouth daily., Disp: , Rfl:    traZODone (DESYREL) 100 MG tablet, Take 1 tablet (100 mg total) by mouth at bedtime as needed for sleep., Disp: 90 tablet, Rfl: 1  Allergies  Allergen Reactions   Adalimumab     Other reaction(s): no response    Objective:   BP (!) 144/90   Pulse 77   Temp (!) 97.3 F (36.3 C) (Temporal)   Ht 5\' 1"  (1.549 m)   Wt 116 lb 9.6 oz (52.9 kg)   SpO2 97%   BMI 22.03 kg/m    Physical Exam Vitals reviewed.  Constitutional:      General: She is not in acute distress.    Appearance: Normal appearance. She is not ill-appearing, toxic-appearing or diaphoretic.  HENT:     Head: Normocephalic and atraumatic.  Eyes:     General: No scleral icterus.       Right eye: No discharge.        Left eye: No discharge.     Conjunctiva/sclera: Conjunctivae normal.  Cardiovascular:     Rate and Rhythm: Normal rate.  Pulmonary:     Effort: Pulmonary effort is normal. No respiratory distress.  Musculoskeletal:        General: Normal range of motion.     Cervical back: Normal range of motion.     Lumbar back: Tenderness (Right SI joint) present. No swelling, edema, signs of trauma or lacerations.     Right hip: Normal.  Skin:    General: Skin is warm and dry.     Capillary Refill: Capillary refill takes less than 2 seconds.  Neurological:     General: No focal deficit present.     Mental Status: She is alert and oriented to person, place, and time. Mental status is at baseline.  Psychiatric:        Mood and Affect: Mood normal.        Behavior: Behavior normal.        Thought Content: Thought content  normal.        Judgment: Judgment normal.

## 2021-04-07 ENCOUNTER — Other Ambulatory Visit (HOSPITAL_COMMUNITY): Payer: Self-pay | Admitting: *Deleted

## 2021-04-07 NOTE — Telephone Encounter (Signed)
Patient aware and verbalizes understanding. 

## 2021-04-10 ENCOUNTER — Other Ambulatory Visit: Payer: Self-pay

## 2021-04-10 ENCOUNTER — Emergency Department (HOSPITAL_COMMUNITY)
Admission: EM | Admit: 2021-04-10 | Discharge: 2021-04-10 | Disposition: A | Discharge status: Home / Self Care | Payer: 59 | Attending: Emergency Medicine | Admitting: Emergency Medicine

## 2021-04-10 ENCOUNTER — Encounter (HOSPITAL_COMMUNITY): Payer: Self-pay | Admitting: Emergency Medicine

## 2021-04-10 ENCOUNTER — Emergency Department (HOSPITAL_COMMUNITY): Payer: 59

## 2021-04-10 DIAGNOSIS — E1122 Type 2 diabetes mellitus with diabetic chronic kidney disease: Secondary | ICD-10-CM | POA: Diagnosis not present

## 2021-04-10 DIAGNOSIS — M25559 Pain in unspecified hip: Secondary | ICD-10-CM

## 2021-04-10 DIAGNOSIS — N182 Chronic kidney disease, stage 2 (mild): Secondary | ICD-10-CM | POA: Diagnosis not present

## 2021-04-10 DIAGNOSIS — M25552 Pain in left hip: Secondary | ICD-10-CM | POA: Diagnosis present

## 2021-04-10 DIAGNOSIS — Z7984 Long term (current) use of oral hypoglycemic drugs: Secondary | ICD-10-CM | POA: Insufficient documentation

## 2021-04-10 DIAGNOSIS — Z7982 Long term (current) use of aspirin: Secondary | ICD-10-CM | POA: Insufficient documentation

## 2021-04-10 DIAGNOSIS — I129 Hypertensive chronic kidney disease with stage 1 through stage 4 chronic kidney disease, or unspecified chronic kidney disease: Secondary | ICD-10-CM | POA: Insufficient documentation

## 2021-04-10 DIAGNOSIS — Z87891 Personal history of nicotine dependence: Secondary | ICD-10-CM | POA: Diagnosis not present

## 2021-04-10 DIAGNOSIS — Z8616 Personal history of COVID-19: Secondary | ICD-10-CM | POA: Insufficient documentation

## 2021-04-10 DIAGNOSIS — J449 Chronic obstructive pulmonary disease, unspecified: Secondary | ICD-10-CM | POA: Insufficient documentation

## 2021-04-10 DIAGNOSIS — Z79899 Other long term (current) drug therapy: Secondary | ICD-10-CM | POA: Diagnosis not present

## 2021-04-10 DIAGNOSIS — E039 Hypothyroidism, unspecified: Secondary | ICD-10-CM | POA: Insufficient documentation

## 2021-04-10 IMAGING — DX DG HIP (WITH OR WITHOUT PELVIS) 2-3V*R*
3 series · 3 of 3 positions shown · non-contrast
Comparison: None.

CLINICAL DATA: Pain and difficulty walking.

EXAM:
DG HIP (WITH OR WITHOUT PELVIS) 2-3V RIGHT

[pelvis ap]
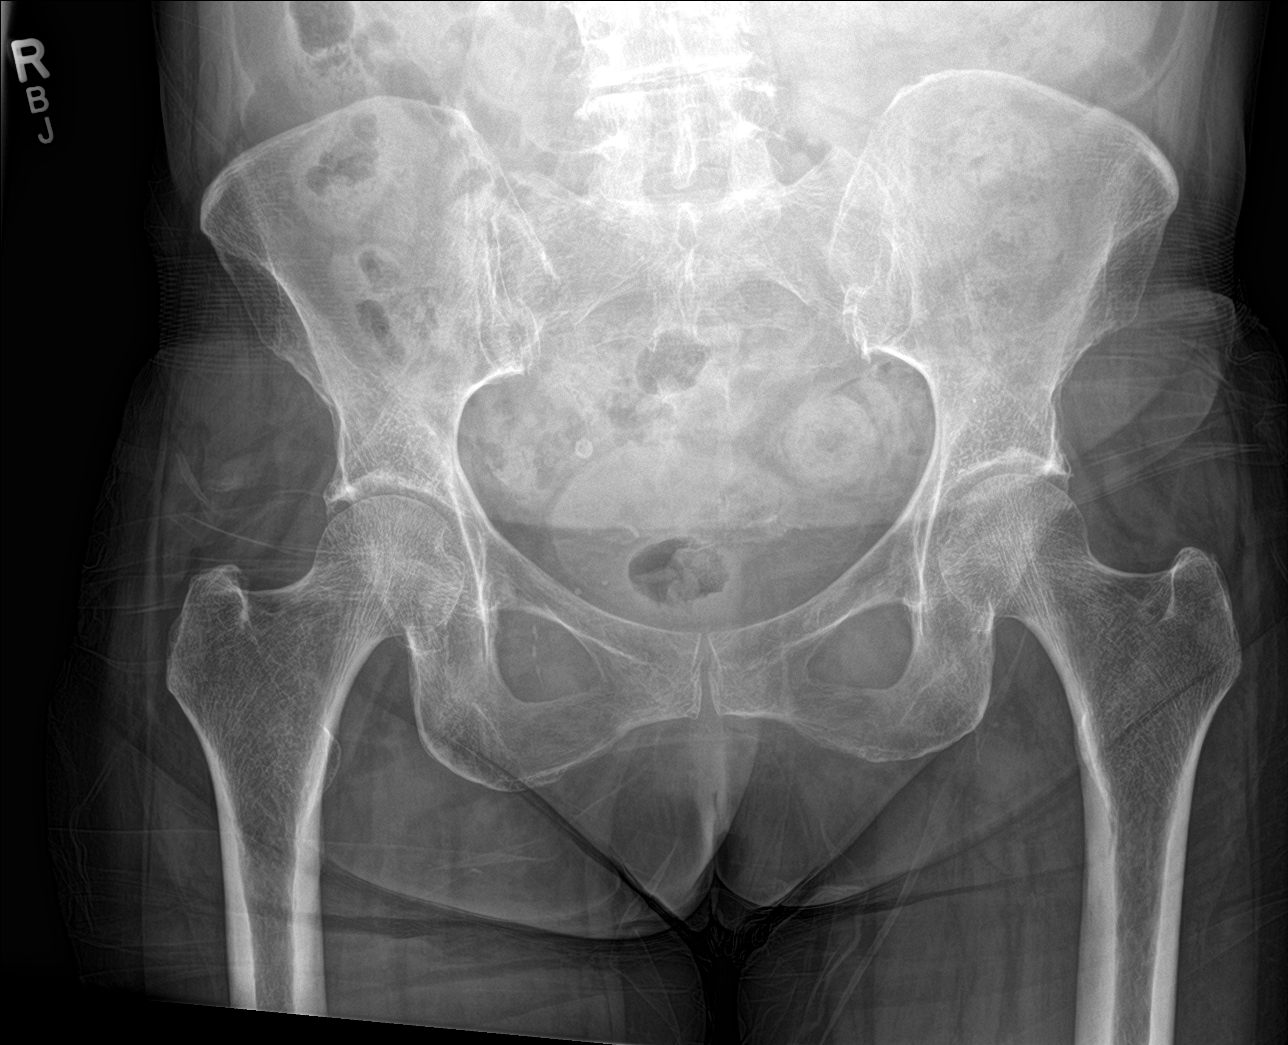

[hip ap]
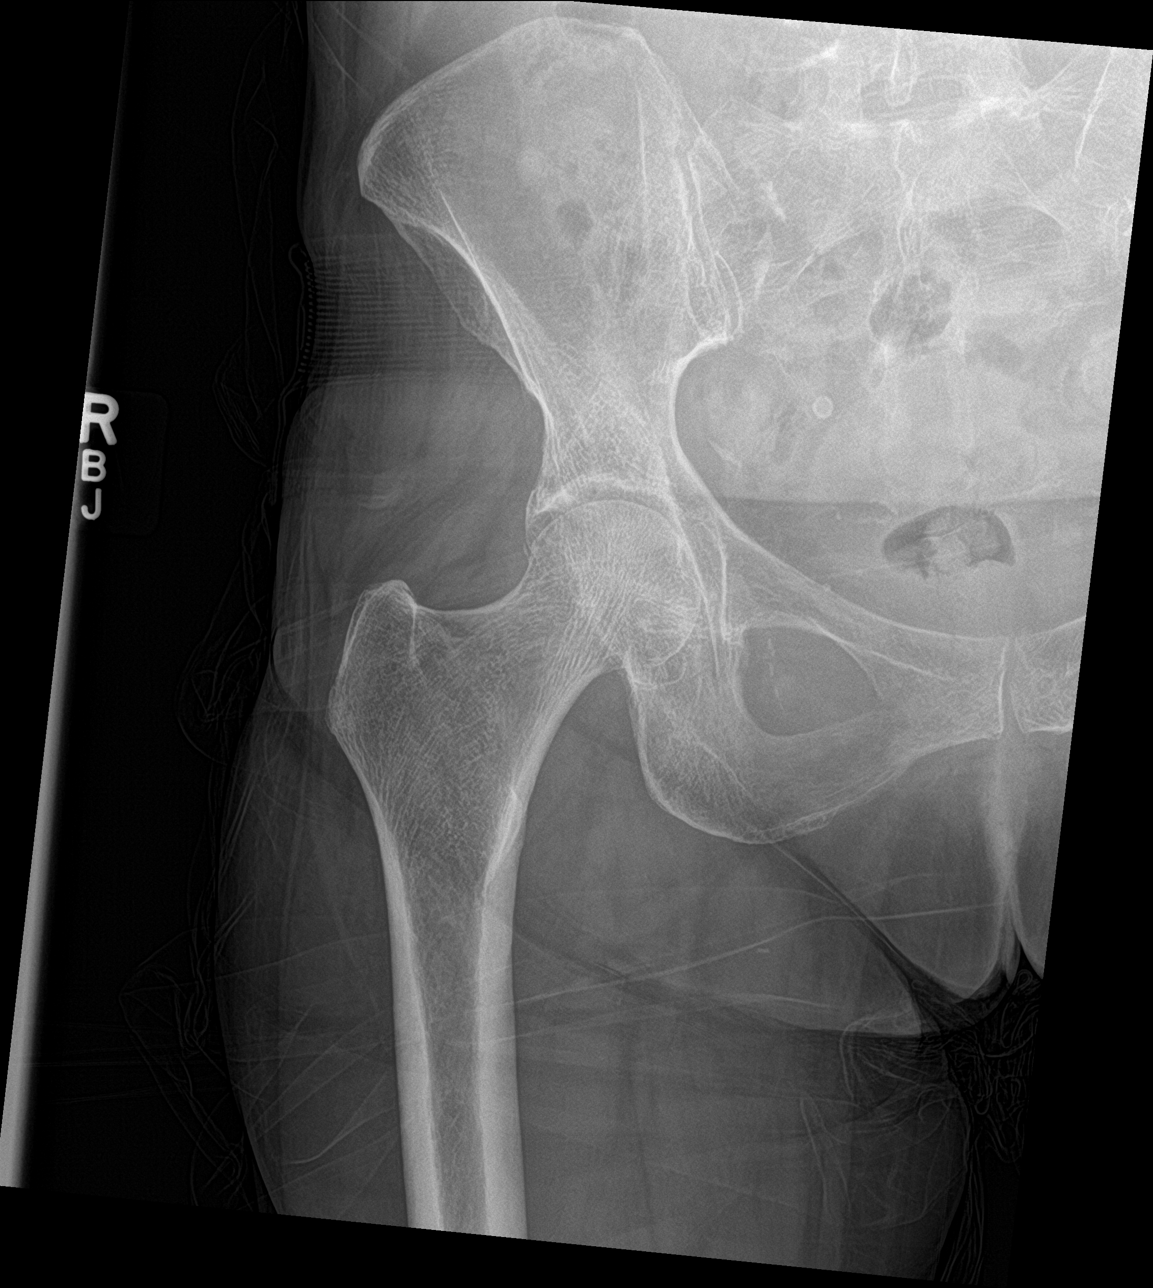

[hip frog leg]
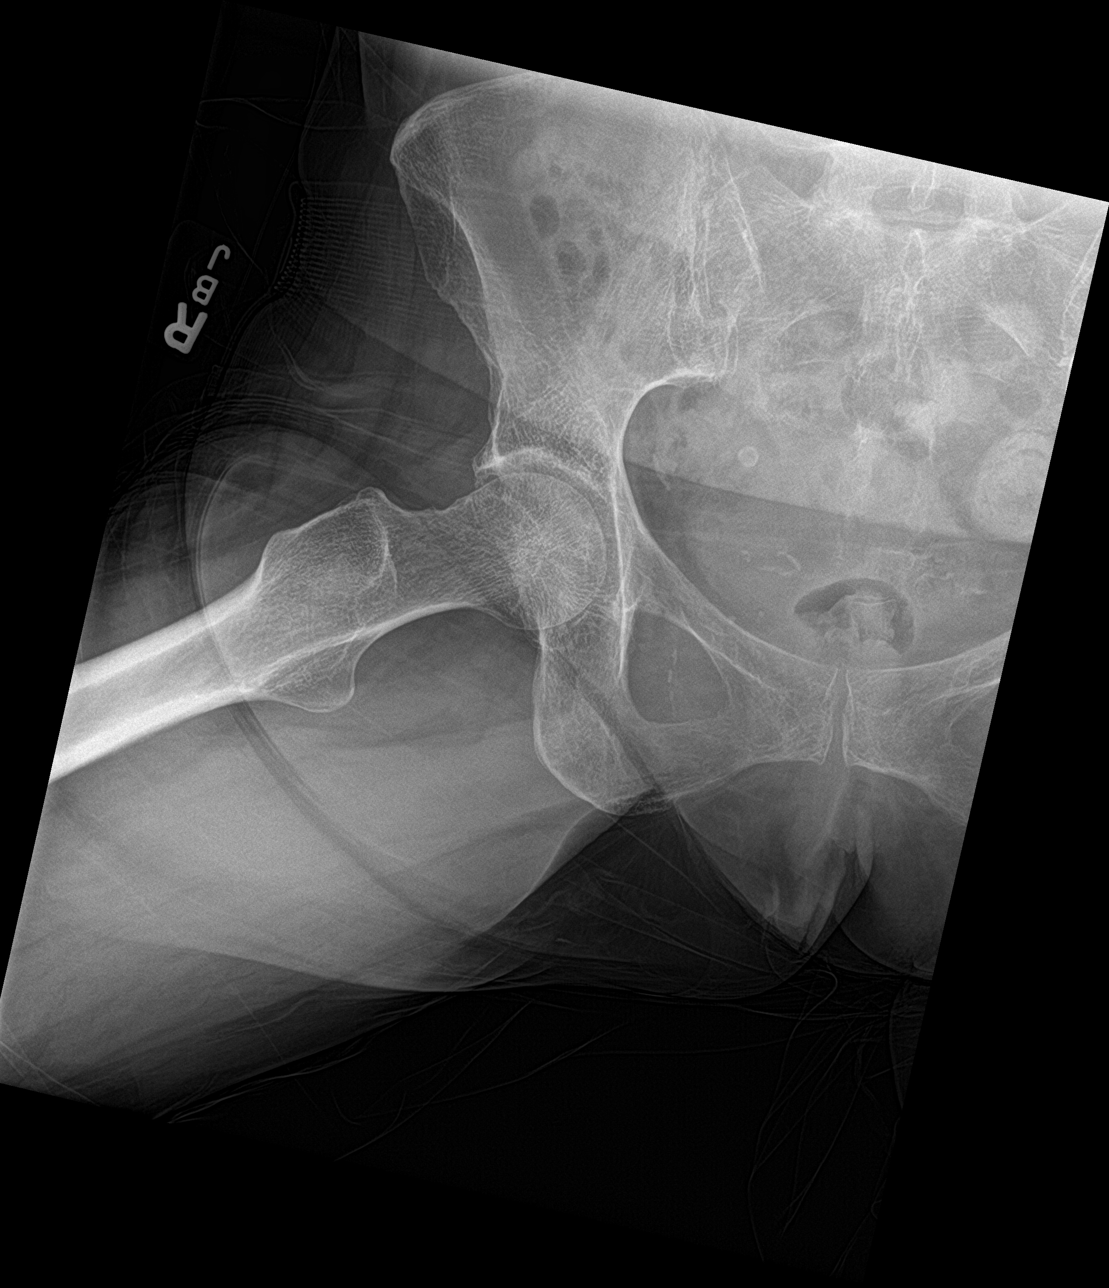

[3 of 3 positions shown; findings below may reference images not displayed]

FINDINGS: There is no evidence of hip fracture or dislocation. There is no
evidence of arthropathy or other focal bone abnormality.
IMPRESSION: Negative.

## 2021-04-10 IMAGING — DX DG LUMBAR SPINE COMPLETE 4+V
5 series · 5 of 5 positions shown · non-contrast
Comparison: None.

CLINICAL DATA: Pain and difficulty walking.

EXAM:
LUMBAR SPINE - COMPLETE 4+ VIEW

[l-spine ap]
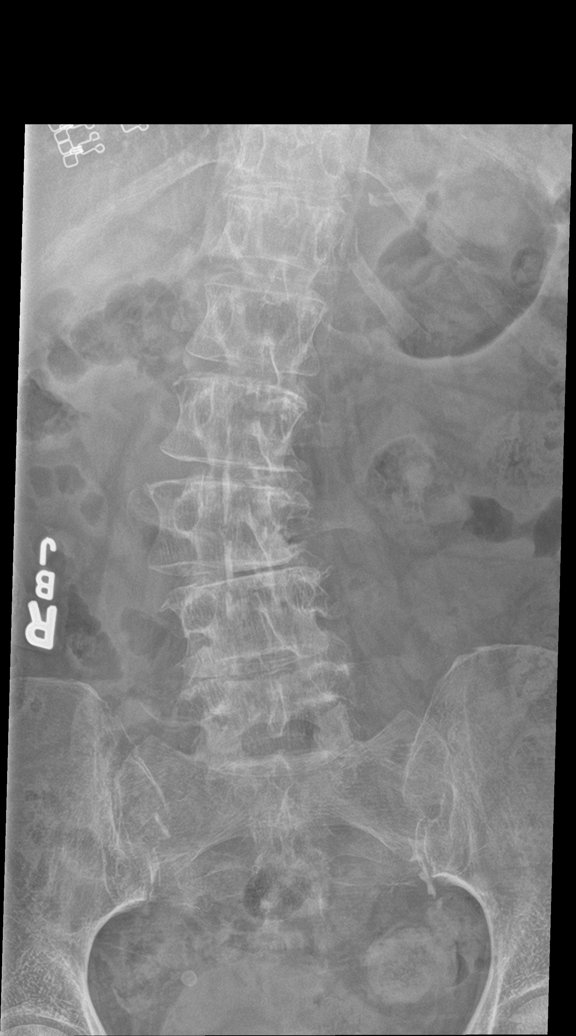

[l-spine obl (1 of 2)]
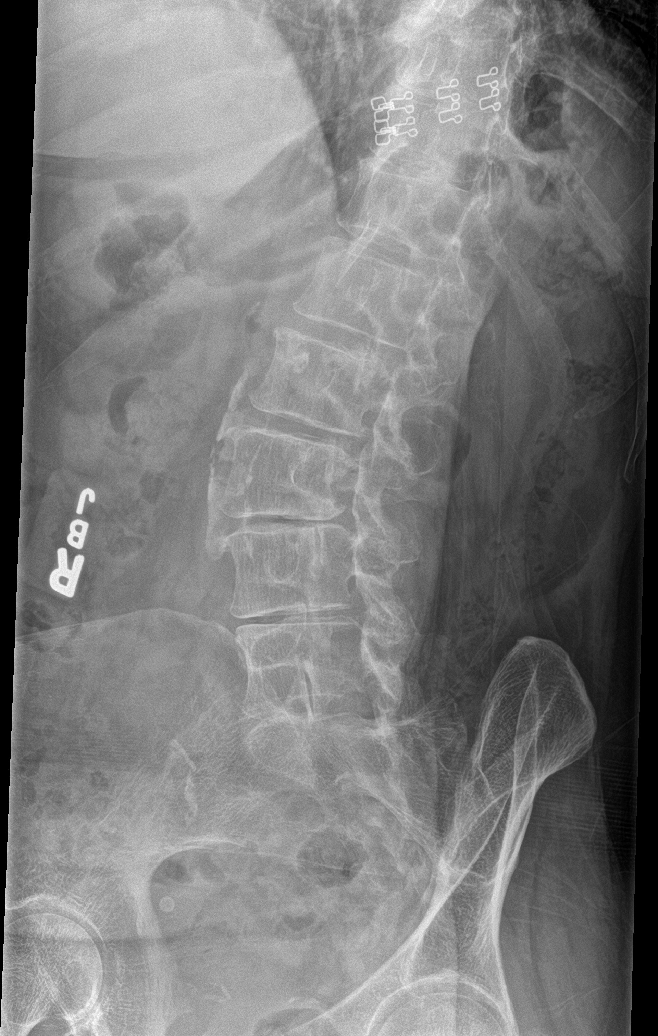

[l-spine lat]
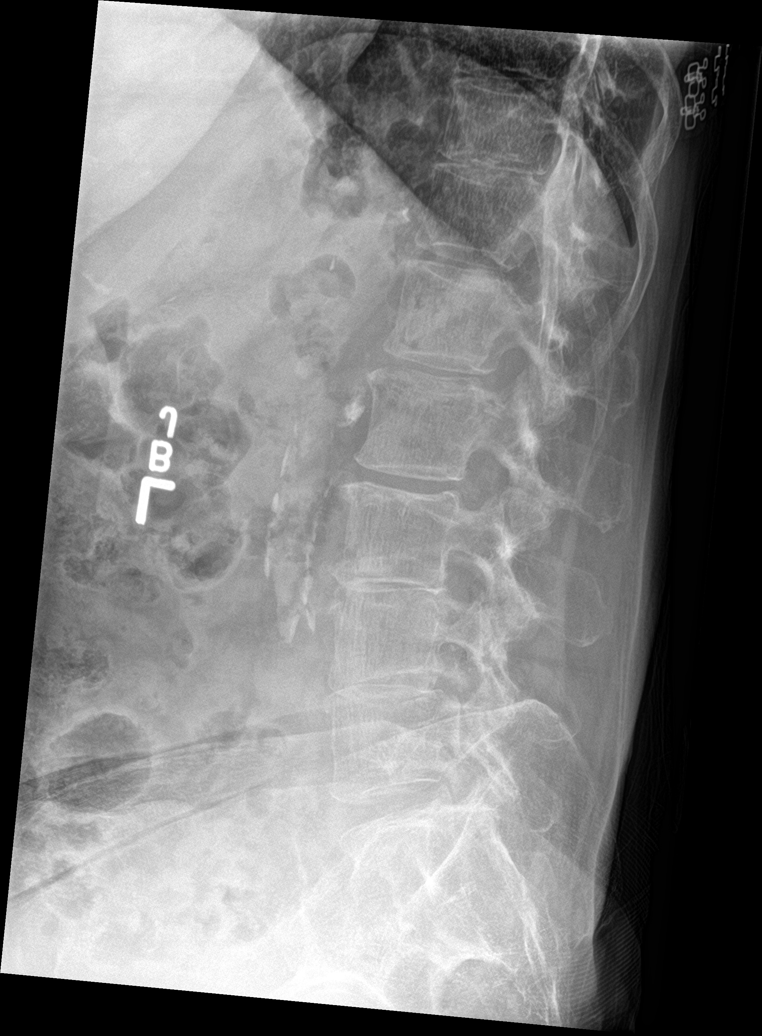

[l-spine spot]
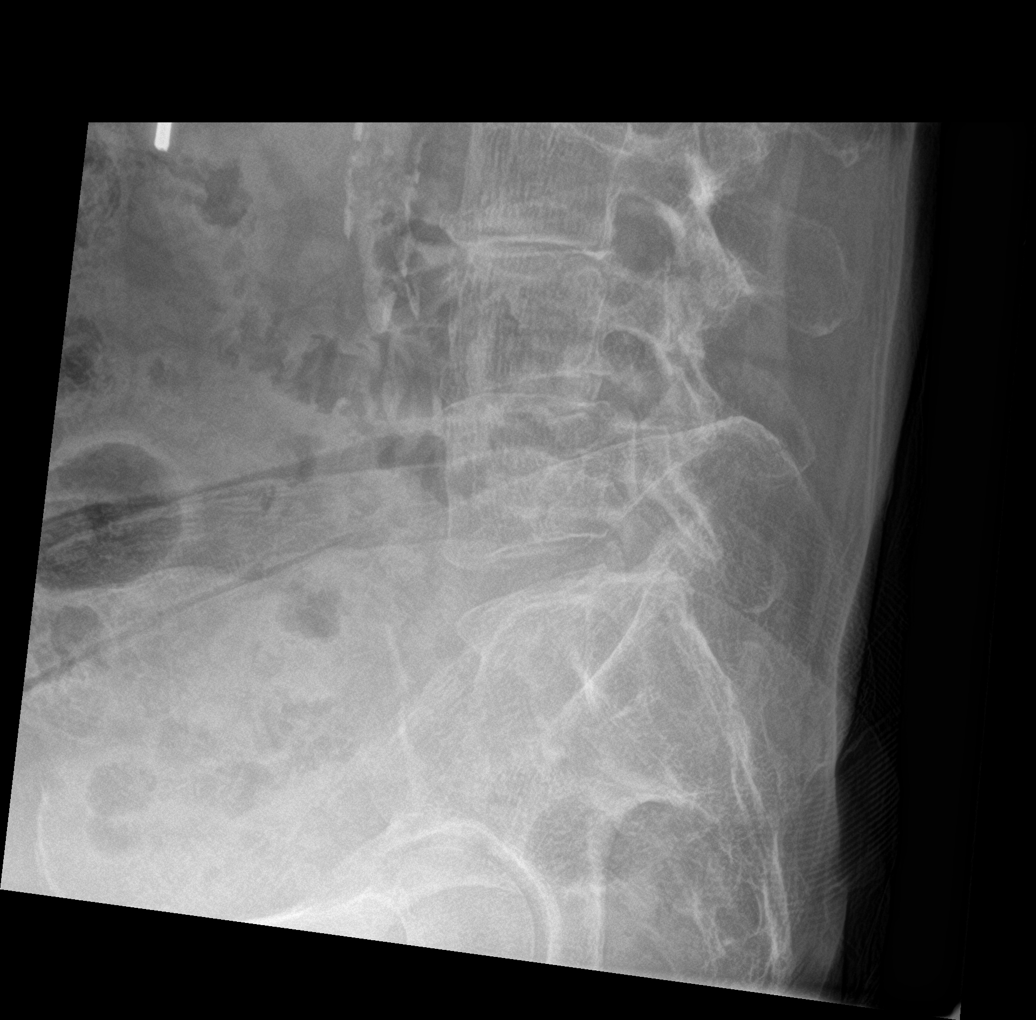

[l-spine obl (2 of 2)]
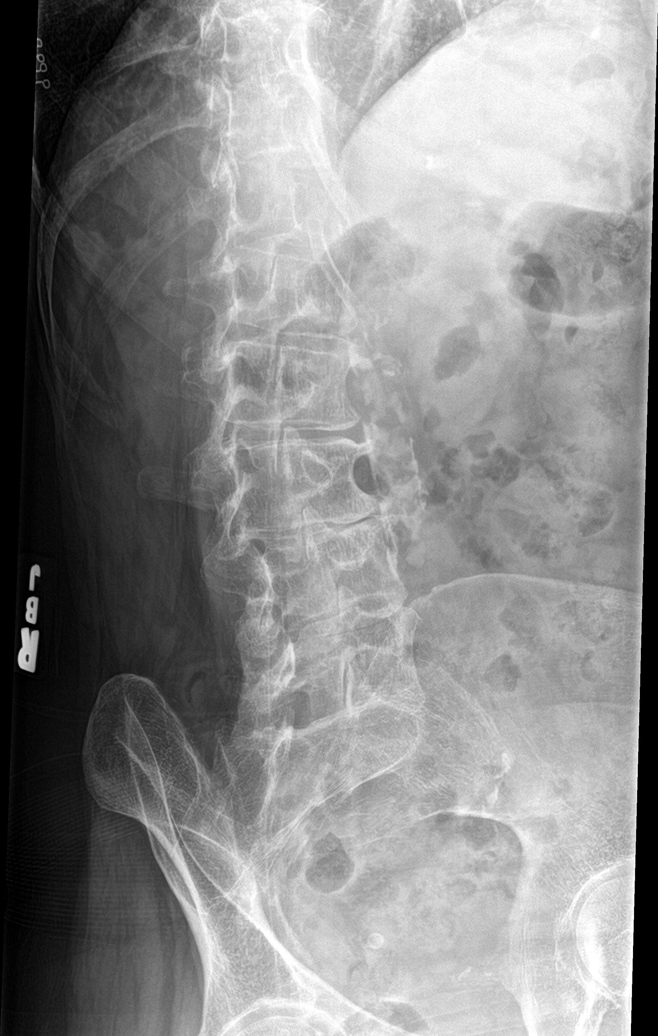

[5 of 5 positions shown; findings below may reference images not displayed]

FINDINGS: Mild dextroconvex scoliosis. Multilevel osteoarthritic changes of
the lumbosacral spine, mild. No evidence of fracture.

Heavy calcific atherosclerotic disease noted.
IMPRESSION: 1. Mild dextroconvex scoliosis.
2. Multilevel osteoarthritic changes of the lumbosacral spine, mild.

## 2021-04-10 MED ORDER — LIDOCAINE 5 % EX PTCH
1.0000 | MEDICATED_PATCH | Freq: Once | CUTANEOUS | Status: DC
  Administered 2021-04-10: 1 via TRANSDERMAL
  Filled 2021-04-10: qty 1

## 2021-04-10 NOTE — ED Triage Notes (Signed)
Pt c/o left hip, back and leg pain since July 3rd. Pt seen PCP on Tuesday and given injection and pain medications but hasn't had any improvement.

## 2021-04-10 NOTE — ED Provider Notes (Signed)
[facility] EMERGENCY DEPARTMENT Provider Note   CSN: [id number] Arrival date & time: 04/10/21  1359     History Chief Complaint  Patient presents with   Pain    Erica Beasley is a 65 y.o. female with past medical history significant for aortic atherosclerosis, CKD stage II, COPD, essential hypertension, GERD, diabetes, hypothyroidism, rheumatoid arthritis on plaquenil.  HPI Patient presents to emergency room today with chief complaint of persistent left hip, back and leg pain x13 days.  Patient states prior to onset of pain she just finished a course of prednisone by her rheumatologist. Patient was seen by PCP on 04/06/2021 for this pain and was given a Depo-Medrol injection started on meloxicam.  She states despite taking his medications her pain has not improved.  She describes the pain as an aching sensation.  Pain radiates down her leg.  She is having difficulty walking because of the pain.  She denies any fall or known injury to her back or hip.  Pain is worse with movement and ambulation.  She rates pain 7 out of 10 in severity.  She denies any swelling in her leg.  Denies any history of blood clots.  Denies fevers, weight loss, numbness/weakness of upper and lower extremities, bowel/bladder incontinence, urinary retention, history of cancer, saddle anesthesia, history of back surgery, history of IVDA.  She denies any history of kidney stones.     Past Medical History:  Diagnosis Date   Aortic atherosclerosis ([place]) 11/13/2020   CKD (chronic kidney disease) stage 2, GFR 60-89 ml/min    COPD (chronic obstructive pulmonary disease) ([place])    COVID-19 06/2019   Depression    Essential hypertension    GERD (gastroesophageal reflux disease)    History of diabetes mellitus    Hyperlipidemia    Hypothyroidism    Neuropathy    Rheumatoid arthritis (HCC)    Vitamin B12 deficiency    Vitamin D deficiency     Patient Active Problem List   Diagnosis Date Noted   Hypothyroidism  11/13/2020   Prediabetes 11/13/2020   B12 deficiency 11/13/2020   Chronic obstructive pulmonary disease ([place]) 11/13/2020   Vitamin D deficiency 11/13/2020   Neuropathy 11/13/2020   Rheumatoid arthritis involving multiple sites ([place]) 11/13/2020   Mixed hyperlipidemia 11/13/2020   Aortic atherosclerosis ([place]) 11/13/2020   GERD (gastroesophageal reflux disease) 11/05/2020   Depression, recurrent ([place]) 09/22/2020   Essential hypertension 09/22/2020   Dehydration 08/13/2020   Hypokalemia 08/13/2020   Abnormal CT of the abdomen 06/02/2020   N&V (nausea and vomiting) 04/08/2020   Monoclonal gammopathy 04/03/2020   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 03/26/2020   Hx of colonic polyps 05/03/2019   H/O Clostridium difficile infection 05/03/2019   Normocytic anemia 05/03/2019   Chronic diarrhea 01/29/2019    Past Surgical History:  Procedure Laterality Date   BIOPSY  09/16/2019   Procedure: BIOPSY;  Surgeon: Daneil Dolin, MD;  [facility];  Service: Endoscopy;;   BIOPSY  04/28/2020   Procedure: BIOPSY;  Surgeon: Daneil Dolin, MD;  [facility];  Service: Endoscopy;;   BIOPSY  06/08/2020   Procedure: BIOPSY;  Surgeon: Daneil Dolin, MD;  [facility];  Service: Endoscopy;;   CATARACT EXTRACTION Bilateral    COLONOSCOPY  09/2010   Dr. Posey Pronto: mild diverticulsis in sigmoid colon.    COLONOSCOPY WITH PROPOFOL N/A 09/16/2019   Rourk: Diverticulosis, random colon biopsies negative for microscopic colitis.   COLONOSCOPY WITH PROPOFOL N/A  06/08/2020   Procedure: COLONOSCOPY WITH PROPOFOL;  Surgeon: Daneil Dolin, MD;  [facility];  Service: Endoscopy;  Laterality: N/A;  9:15am   ESOPHAGOGASTRODUODENOSCOPY (EGD) WITH PROPOFOL N/A 04/28/2020   Procedure: ESOPHAGOGASTRODUODENOSCOPY (EGD) WITH PROPOFOL;  Surgeon: Daneil Dolin, MD;  [facility];  Service: Endoscopy;  Laterality: N/A;  6:65   YAG LASER APPLICATION Left  99/35/7017   Procedure: YAG LASER APPLICATION;  Surgeon: Williams Che, MD;  Location: [facility];  Service: Ophthalmology;  Laterality: Left;     OB History   No obstetric history on file.     Family History  Problem Relation Age of Onset   Stroke Mother    Heart disease Mother    Diabetes Mother    Breast cancer Mother    Other Father        stomach taken out for some reason   Migraines Daughter    Colon cancer Neg Hx     Social History   Tobacco Use   Smoking status: Former    Packs/day: 1.00    Years: 15.00    Pack years: 15.00    Types: Cigarettes    Quit date: 09/11/2017    Years since quitting: 3.5   Smokeless tobacco: Former  Scientific laboratory technician Use: Never used  Substance Use Topics   Alcohol use: Never   Drug use: Never    Home Medications Prior to Admission medications   Medication Sig Start Date End Date Taking? Authorizing Provider  albuterol (VENTOLIN HFA) 108 (90 Base) MCG/ACT inhaler Inhale 2 puffs into the lungs every 6 (six) hours as needed for wheezing or shortness of breath. 11/13/20   Loman Brooklyn, FNP  aspirin 81 MG EC tablet Take 1 tablet (81 mg total) by mouth daily. Swallow whole. 02/16/21   Loman Brooklyn, FNP  Cholecalciferol (VITAMIN D3) 125 MCG (5000 UT) TABS Take 1 tablet (5,000 Units total) by mouth daily. 05/07/20   Ivy Lynn, NP  citalopram (CELEXA) 40 MG tablet Take 1 tablet (40 mg total) by mouth daily. 11/13/20   Loman Brooklyn, FNP  diclofenac Sodium (VOLTAREN) 1 % GEL See admin instructions.    [provider]  ferrous sulfate 325 (65 FE) MG tablet Take 325 mg by mouth daily with breakfast.    [provider]  gabapentin (NEURONTIN) 300 MG capsule Take 1 capsule (300 mg total) by mouth 3 (three) times daily. Patient taking differently: Take 900 mg by mouth at bedtime. 11/13/20   Loman Brooklyn, FNP  glipiZIDE (GLUCOTROL XL) 5 MG 24 hr tablet 1 tablet    [provider]  golimumab (SIMPONI  ARIA) 50 MG/4ML SOLN injection See admin instructions.    [provider]  hydroxychloroquine (PLAQUENIL) 200 MG tablet Take 200 mg by mouth daily.     [provider]  leflunomide (ARAVA) 20 MG tablet Take 20 mg by mouth daily.    [provider]  levothyroxine (SYNTHROID) 100 MCG tablet Take 1 tablet (100 mcg total) by mouth every morning. 02/17/21   Loman Brooklyn, FNP  lisinopril (ZESTRIL) 10 MG tablet Take 1 tablet (10 mg total) by mouth daily. Patient taking differently: Take 5 mg by mouth daily. 11/13/20   Loman Brooklyn, FNP  loperamide (IMODIUM A-D) 2 MG tablet Take 1 tablet (2 mg total) by mouth in the morning. Take second dose if needed Patient taking differently: Take 2 mg by mouth as needed. Take  second dose if needed 09/14/20   Dettinger, Fransisca Kaufmann, MD  meloxicam (MOBIC) 7.5 MG tablet Take 1 tablet (7.5 mg total) by mouth daily. 04/06/21   Loman Brooklyn, FNP  Pancrelipase, Lip-Prot-Amyl, (CREON) 24000-76000 units CPEP Take 1-2 capsules (24,000-48,000 Units total) by mouth See admin instructions. Take 48,000 units (2 capsules) with meals and 24000 units (1 capsule) with snacks 02/16/21   Annitta Needs, NP  pantoprazole (PROTONIX) 40 MG tablet TAKE ONE TABLET BY MOUTH DAILY BEFORE BREAKFAST 11/13/20   Loman Brooklyn, FNP  potassium chloride (KLOR-CON) 10 MEQ tablet 1 tablet with food    [provider]  potassium chloride SA (KLOR-CON) 20 MEQ tablet Take 1 tablet (20 mEq total) by mouth daily. 11/13/20   Loman Brooklyn, FNP  pravastatin (PRAVACHOL) 40 MG tablet Take 1 tablet (40 mg total) by mouth at bedtime. 11/13/20   Loman Brooklyn, FNP  raloxifene (EVISTA) 60 MG tablet 1 tablet    [provider]  sulfaDIAZINE 500 MG tablet Take 500 mg by mouth 2 (two) times daily.    [provider]  Tocilizumab (ACTEMRA ACTPEN) 162 MG/0.9ML SOAJ Inject the contents of 1 pen (162mg ) under the skin once a week as directed    [provider]  Tofacitinib Citrate ER (XELJANZ XR) 11 MG TB24 Take 1 tablet by mouth daily.    [provider]  traZODone (DESYREL) 100 MG tablet Take 1 tablet (100 mg total) by mouth at bedtime as needed for sleep. 11/13/20   Loman Brooklyn, FNP    Allergies    Adalimumab  Review of Systems   Review of Systems All other systems are reviewed and are negative for acute change except as noted in the HPI.  Physical Exam Updated Vital Signs BP (!) 142/92 (BP Location: Right Arm)   Pulse 85   Temp 98.4 F (36.9 C) (Oral)   Resp 18   Ht 5\' 4"  (1.626 m)   Wt 52.6 kg   SpO2 94%   BMI 19.91 kg/m   Physical Exam Vitals and nursing note reviewed.  Constitutional:      Appearance: She is well-developed. She is not ill-appearing or toxic-appearing.  HENT:     Head: Normocephalic and atraumatic.     Nose: Nose normal.  Eyes:     General: No scleral icterus.       Right eye: No discharge.        Left eye: No discharge.     Conjunctiva/sclera: Conjunctivae normal.  Neck:     Vascular: No JVD.  Cardiovascular:     Rate and Rhythm: Normal rate and regular rhythm.     Pulses: Normal pulses.          Dorsalis pedis pulses are 2+ on the right side and 2+ on the left side.     Heart sounds: Normal heart sounds.  Pulmonary:     Effort: Pulmonary effort is normal.     Breath sounds: Normal breath sounds.  Abdominal:     General: There is no distension.     Palpations: Abdomen is soft. There is no mass.     Tenderness: There is no abdominal tenderness. There is no right CVA tenderness, left CVA tenderness, guarding or rebound.     Hernia: No hernia is present.  Musculoskeletal:        General: Normal range of motion.     Cervical back: Normal range of motion.     Comments: Full  range of motion of the T-spine and L-spine No tenderness to palpation of the spinous processes of the T-spine or L-spine No crepitus, deformity or step-offs No tenderness to palpation of the  paraspinous muscles of the L-spine   Tenderness to palpation of right hip.  No overlying skin changes.  Compartments in right lower extremity are soft.  She is neurovascular intact distally.  She has full range of motion of right hip, knee and ankle.  She is ambulatory with mildly antalgic gait.  Sensation is intact.  No saddle anesthesia.   Skin:    General: Skin is warm and dry.     Capillary Refill: Capillary refill takes less than 2 seconds.  Neurological:     Mental Status: She is oriented to person, place, and time.     GCS: GCS eye subscore is 4. GCS verbal subscore is 5. GCS motor subscore is 6.     Comments: Fluent speech, no facial droop.  Psychiatric:        Behavior: Behavior normal.    ED Results / Procedures / Treatments   Labs (all labs ordered are listed, but only abnormal results are displayed) Labs Reviewed - No data to display  EKG None  Radiology DG Lumbar Spine Complete  Result Date: 04/10/2021 CLINICAL DATA:  Pain and difficulty walking. EXAM: LUMBAR SPINE - COMPLETE 4+ VIEW COMPARISON:  None. FINDINGS: Mild dextroconvex scoliosis. 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 No evidence of fracture. Heavy calcific atherosclerotic disease noted. IMPRESSION: 1. Mild dextroconvex scoliosis. 2. 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 Electronically Signed   By: Fidela Salisbury M.D.   On: 04/10/2021 17:37   DG Hip Unilat W or Wo Pelvis 2-3 Views Right  Result Date: 04/10/2021 CLINICAL DATA:  Pain and difficulty walking. EXAM: DG HIP (WITH OR WITHOUT PELVIS) 2-3V RIGHT COMPARISON:  None. FINDINGS: There is no evidence of hip fracture or dislocation. There is no evidence of arthropathy or other focal bone abnormality. IMPRESSION: Negative. Electronically Signed   By: Fidela Salisbury M.D.   On: 04/10/2021 17:36    Procedures Procedures   Medications Ordered in ED Medications  lidocaine (LIDODERM) 5 % 1 patch (1  patch Transdermal Patch Applied 04/10/21 1707)    ED Course  I have reviewed the triage vital signs and the nursing notes.  Pertinent labs & imaging results that were available during my care of the patient were reviewed by me and considered in my medical decision making (see chart for details).    MDM Rules/Calculators/A&P                          History provided by patient with additional history obtained from chart review.    Presenting with low back and right hip pain.  Patient afebrile, hemodynamically stable.  On my exam patient is well-appearing.  She has tenderness palpation of right hip without any overlying skin changes.  Compartments are soft and she is neurovascularly intact.  No red flags for back pain.  She is ambulatory with mildly antalgic gait.  Patient given lidocaine patch.  X-ray of lumbar spine performed and shows mild dextroconvex scoliosis and multilevel osteoarthritic changes of the lumbosacral spine.  X-ray of left hip without fracture or dislocation.  I viewed imaging and agree with radiologist impression.  I discussed results with the patient.  Pain is mildly improved with Lidoderm patch.  Recommend patient take Tylenol for pain at home and  follow-up with orthopedist.  She was given information for local orthopedist Dr. Aline Brochure.  Exam is not consistent with septic joint.  Patient has tried muscle laxer's without improvement, she does not wish to try any other medications at this time. The patient appears reasonably screened and/or stabilized for discharge and I doubt any other medical condition or other [facility] requiring further screening, evaluation, or treatment in the ED at this time prior to discharge. The patient is safe for discharge with strict return precautions discussed.     Portions of this note were generated with [employer]. Dictation errors may occur despite best attempts at proofreading.  Final Clinical Impression(s) / ED Diagnoses Final  diagnoses:  Hip pain    Rx / DC Orders ED Discharge Orders     None        Lewanda Rife 04/10/21 1833    Sherwood Gambler, MD 04/13/21 [phone]

## 2021-04-10 NOTE — ED Notes (Signed)
Pt. Ambulated to rest room 

## 2021-04-10 NOTE — Discharge Instructions (Addendum)
X-rays did not show any broken bones.  Take Tylenol for pain at home.  Follow-up with Dr. Aline Brochure and the local orthopedist (bone doctor) if you continue to have pain.  Call his office first thing on Monday to schedule follow-up appointment.

## 2021-04-12 ENCOUNTER — Other Ambulatory Visit: Payer: Self-pay

## 2021-04-12 ENCOUNTER — Ambulatory Visit (INDEPENDENT_AMBULATORY_CARE_PROVIDER_SITE_OTHER): Payer: 59 | Admitting: Family Medicine

## 2021-04-12 ENCOUNTER — Encounter: Payer: Self-pay | Admitting: Family Medicine

## 2021-04-12 DIAGNOSIS — M25551 Pain in right hip: Secondary | ICD-10-CM | POA: Diagnosis not present

## 2021-04-12 DIAGNOSIS — M25559 Pain in unspecified hip: Secondary | ICD-10-CM

## 2021-04-12 MED ORDER — BACLOFEN 10 MG PO TABS: 5.0000 mg | ORAL_TABLET | Freq: Three times a day (TID) | ORAL | 3 refills | Status: DC | PRN

## 2021-04-12 MED ORDER — HYDROCODONE-ACETAMINOPHEN 5-325 MG PO TABS: 1.0000 | ORAL_TABLET | Freq: Four times a day (QID) | ORAL | 0 refills | Status: DC | PRN

## 2021-04-12 NOTE — Progress Notes (Signed)
Office Visit Note   Patient: Erica Beasley           Date of Birth: [birth year]           MRN: [id number] Visit Date: 04/12/2021 Requested by: Loman Brooklyn, [place],  [place] [address] PCP: Loman Brooklyn, FNP  Subjective: Chief Complaint  Patient presents with   Right Hip - Pain    Pain in the right posterior hip since 03/28/21 - this was after she finished a prednisone taper that she was taking for an RA flare in her hands/feet (routinely takes 5 mg prednisone daily). She saw her PCP on 04/06/21 - was given Rx for meloxicam and an IM steroid injection. Went to ED on 04/10/21 for worsening pain. Cannot stand up straight - stays bent at waist - due to pain in the hip/upper thigh.     HPI: She is here with right posterior hip pain.  Symptoms started July 3, no injury.  She had just finished a prednisone taper for a rheumatoid flare in her hands and feet when she started having this pain.  Pain radiates down the back of the right leg toward the knee.  She cannot stand upright without severe pain.  She went to the ER and was given IM steroid with no improvement.  Denies bowel or bladder dysfunction, fevers or chills.  She has not had a rash.              ROS:   All other systems were reviewed and are negative.  Objective: Vital Signs: There were no vitals taken for this visit.  Physical Exam:  General:  Alert and oriented, in no acute distress. Pulm:  Breathing unlabored. Psy:  Normal mood, congruent affect.  Right hip: She has tenderness near the right SI joint but palpation there does not completely reproduce her pain.  Mild pain in the right sciatic notch.  No pain with internal hip rotation.  Straight leg raises equivocal, lower extremity strength and reflexes remain normal.  Imaging: X-rays from the hospital were reviewed and show asymmetric disc space narrowing at L3-4, no obvious compression fracture.  Hip joints have minimal degenerative change.  SI joints are  unremarkable.    Assessment & Plan: Right posterior hip pain, etiology uncertain.  Possibilities include sacroiliac dysfunction, lumbar disc protrusion. -Discussed options with her, I suggested physical therapy but she is not interested in doing that.  I gave her some home stretches to do.  Hydrocodone for more severe pain and baclofen for muscle spasm.  Could refer her to Dr. Ernestina Patches for epidural injection or possibly SI joint injection if symptoms persist.     Procedures: No procedures performed        PMFS History: Patient Active Problem List   Diagnosis Date Noted   Hypothyroidism 11/13/2020   Prediabetes 11/13/2020   B12 deficiency 11/13/2020   Chronic obstructive pulmonary disease ([place]) 11/13/2020   Vitamin D deficiency 11/13/2020   Neuropathy 11/13/2020   Rheumatoid arthritis involving multiple sites ([place]) 11/13/2020   Mixed hyperlipidemia 11/13/2020   Aortic atherosclerosis ([place]) 11/13/2020   GERD (gastroesophageal reflux disease) 11/05/2020   Depression, recurrent ([place]) 09/22/2020   Essential hypertension 09/22/2020   Dehydration 08/13/2020   Hypokalemia 08/13/2020   Abnormal CT of the abdomen 06/02/2020   N&V (nausea and vomiting) 04/08/2020   Monoclonal gammopathy 04/03/2020   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 03/26/2020   Hx of colonic polyps 05/03/2019   H/O  Clostridium difficile infection 05/03/2019   Normocytic anemia 05/03/2019   Chronic diarrhea 01/29/2019   Past Medical History:  Diagnosis Date   Aortic atherosclerosis ([place]) 11/13/2020   CKD (chronic kidney disease) stage 2, GFR 60-89 ml/min    COPD (chronic obstructive pulmonary disease) ([place])    COVID-19 06/2019   Depression    Essential hypertension    GERD (gastroesophageal reflux disease)    History of diabetes mellitus    Hyperlipidemia    Hypothyroidism    Neuropathy    Rheumatoid arthritis (HCC)    Vitamin B12 deficiency    Vitamin D deficiency     Family History   Problem Relation Age of Onset   Stroke Mother    Heart disease Mother    Diabetes Mother    Breast cancer Mother    Other Father        stomach taken out for some reason   Migraines Daughter    Colon cancer Neg Hx     Past Surgical History:  Procedure Laterality Date   BIOPSY  09/16/2019   Procedure: BIOPSY;  Surgeon: Daneil Dolin, MD;  [facility];  Service: Endoscopy;;   BIOPSY  04/28/2020   Procedure: BIOPSY;  Surgeon: Daneil Dolin, MD;  [facility];  Service: Endoscopy;;   BIOPSY  06/08/2020   Procedure: BIOPSY;  Surgeon: Daneil Dolin, MD;  [facility];  Service: Endoscopy;;   CATARACT EXTRACTION Bilateral    COLONOSCOPY  09/2010   Dr. Posey Pronto: mild diverticulsis in sigmoid colon.    COLONOSCOPY WITH PROPOFOL N/A 09/16/2019   Rourk: Diverticulosis, random colon biopsies negative for microscopic colitis.   COLONOSCOPY WITH PROPOFOL N/A 06/08/2020   Procedure: COLONOSCOPY WITH PROPOFOL;  Surgeon: Daneil Dolin, MD;  [facility];  Service: Endoscopy;  Laterality: N/A;  9:15am   ESOPHAGOGASTRODUODENOSCOPY (EGD) WITH PROPOFOL N/A 04/28/2020   Procedure: ESOPHAGOGASTRODUODENOSCOPY (EGD) WITH PROPOFOL;  Surgeon: Daneil Dolin, MD;  [facility];  Service: Endoscopy;  Laterality: N/A;  4:09   YAG LASER APPLICATION Left 73/53/2992   Procedure: YAG LASER APPLICATION;  Surgeon: Williams Che, MD;  Location: [facility];  Service: Ophthalmology;  Laterality: Left;   Social History   Occupational History   Occupation: DISABLED  Tobacco Use   Smoking status: Former    Packs/day: 1.00    Years: 15.00    Pack years: 15.00    Types: Cigarettes    Quit date: 09/11/2017    Years since quitting: 3.5   Smokeless tobacco: Former  Scientific laboratory technician Use: Never used  Substance and Sexual Activity   Alcohol use: Never   Drug use: Never   Sexual activity: Not Currently

## 2021-04-14 ENCOUNTER — Other Ambulatory Visit: Payer: Self-pay | Admitting: Family Medicine

## 2021-04-15 ENCOUNTER — Other Ambulatory Visit (HOSPITAL_COMMUNITY): Payer: Self-pay

## 2021-04-15 ENCOUNTER — Ambulatory Visit: Payer: 59 | Admitting: Orthopaedic Surgery

## 2021-04-15 ENCOUNTER — Encounter: Payer: Self-pay | Admitting: Family Medicine

## 2021-04-15 DIAGNOSIS — M25559 Pain in unspecified hip: Secondary | ICD-10-CM

## 2021-04-16 ENCOUNTER — Other Ambulatory Visit: Payer: Self-pay | Admitting: Family Medicine

## 2021-04-19 ENCOUNTER — Other Ambulatory Visit: Payer: Self-pay | Admitting: Family Medicine

## 2021-04-19 MED ORDER — HYDROCODONE-ACETAMINOPHEN 5-325 MG PO TABS: 1.0000 | ORAL_TABLET | Freq: Four times a day (QID) | ORAL | 0 refills | Status: DC | PRN

## 2021-04-20 ENCOUNTER — Other Ambulatory Visit: Payer: Self-pay | Admitting: Family Medicine

## 2021-04-21 ENCOUNTER — Encounter: Payer: Self-pay | Admitting: Family Medicine

## 2021-04-21 ENCOUNTER — Other Ambulatory Visit: Payer: Self-pay | Admitting: Family Medicine

## 2021-04-21 DIAGNOSIS — M25559 Pain in unspecified hip: Secondary | ICD-10-CM

## 2021-04-22 NOTE — Addendum Note (Signed)
Addended by: Hortencia Pilar on: 04/22/2021 08:18 AM   Modules accepted: Orders

## 2021-04-27 ENCOUNTER — Ambulatory Visit: Payer: 59 | Admitting: Physical Therapy

## 2021-04-27 ENCOUNTER — Other Ambulatory Visit: Payer: Self-pay

## 2021-04-27 ENCOUNTER — Encounter: Payer: Self-pay | Admitting: Family Medicine

## 2021-04-28 ENCOUNTER — Other Ambulatory Visit: Payer: Self-pay | Admitting: Family Medicine

## 2021-04-30 ENCOUNTER — Other Ambulatory Visit: Payer: Self-pay | Admitting: Family Medicine

## 2021-05-03 MED ORDER — HYDROCODONE-ACETAMINOPHEN 5-325 MG PO TABS: 1.0000 | ORAL_TABLET | Freq: Four times a day (QID) | ORAL | 0 refills | Status: DC | PRN

## 2021-05-10 ENCOUNTER — Encounter: Payer: Self-pay | Admitting: Family Medicine

## 2021-05-10 DIAGNOSIS — M25559 Pain in unspecified hip: Secondary | ICD-10-CM

## 2021-05-11 ENCOUNTER — Ambulatory Visit: Payer: 59 | Admitting: Gastroenterology

## 2021-05-11 ENCOUNTER — Ambulatory Visit: Payer: Medicaid Other | Admitting: Nurse Practitioner

## 2021-05-12 ENCOUNTER — Ambulatory Visit: Payer: Medicaid Other | Admitting: Family Medicine

## 2021-05-13 ENCOUNTER — Telehealth: Payer: Self-pay | Admitting: Physical Medicine and Rehabilitation

## 2021-05-13 NOTE — Telephone Encounter (Signed)
Pt's daughter Erica Beasley called to reschedule her mom appt. Please call about this matter at [phone].

## 2021-05-17 ENCOUNTER — Other Ambulatory Visit: Payer: Self-pay | Admitting: Family Medicine

## 2021-05-17 DIAGNOSIS — I700 Atherosclerosis of aorta: Secondary | ICD-10-CM

## 2021-05-17 DIAGNOSIS — E1165 Type 2 diabetes mellitus with hyperglycemia: Secondary | ICD-10-CM

## 2021-05-17 DIAGNOSIS — E782 Mixed hyperlipidemia: Secondary | ICD-10-CM

## 2021-05-17 DIAGNOSIS — F339 Major depressive disorder, recurrent, unspecified: Secondary | ICD-10-CM

## 2021-05-19 ENCOUNTER — Telehealth: Payer: Self-pay

## 2021-05-19 NOTE — Telephone Encounter (Signed)
Pt called and would like to know how much her injection is going to cost tomorrow.   Please advise

## 2021-05-20 ENCOUNTER — Other Ambulatory Visit: Payer: Self-pay

## 2021-05-20 ENCOUNTER — Encounter: Payer: Self-pay | Admitting: Physical Medicine and Rehabilitation

## 2021-05-20 ENCOUNTER — Ambulatory Visit: Payer: Self-pay

## 2021-05-20 ENCOUNTER — Ambulatory Visit (INDEPENDENT_AMBULATORY_CARE_PROVIDER_SITE_OTHER): Payer: 59 | Admitting: Physical Medicine and Rehabilitation

## 2021-05-20 ENCOUNTER — Inpatient Hospital Stay (HOSPITAL_COMMUNITY): Admission: RE | Admit: 2021-05-20 | Payer: Medicaid Other | Source: Ambulatory Visit

## 2021-05-20 VITALS — BP 97/67 | HR 88

## 2021-05-20 DIAGNOSIS — M5416 Radiculopathy, lumbar region: Secondary | ICD-10-CM

## 2021-05-20 MED ORDER — BETAMETHASONE SOD PHOS & ACET 6 (3-3) MG/ML IJ SUSP
12.0000 mg | Freq: Once | INTRAMUSCULAR | Status: AC
  Administered 2021-05-20: 12 mg

## 2021-05-20 NOTE — Progress Notes (Signed)
Pt state lower back pain that travels to both hips and legs. Pt state walking, standing and sitting makes the pain worse. Pt state she takes pain meds and uses ice to help ease her pain.  Numeric Pain Rating Scale and Functional Assessment Average Pain 10   In the last MONTH (on 0-10 scale) has pain interfered with the following?  1. General activity like being  able to carry out your everyday physical activities such as walking, climbing stairs, carrying groceries, or moving a chair?  Rating(10)   +Driver, -BT, -Dye Allergies.

## 2021-05-20 NOTE — Patient Instructions (Signed)

## 2021-05-25 ENCOUNTER — Encounter: Payer: Self-pay | Admitting: Physical Medicine and Rehabilitation

## 2021-05-26 MED ORDER — HYDROCODONE-ACETAMINOPHEN 5-325 MG PO TABS: 1.0000 | ORAL_TABLET | Freq: Four times a day (QID) | ORAL | 0 refills | Status: DC | PRN

## 2021-05-26 MED ORDER — BACLOFEN 10 MG PO TABS: 5.0000 mg | ORAL_TABLET | Freq: Three times a day (TID) | ORAL | 3 refills | Status: DC | PRN

## 2021-06-01 ENCOUNTER — Ambulatory Visit (HOSPITAL_COMMUNITY)
Admission: RE | Admit: 2021-06-01 | Discharge: 2021-06-01 | Disposition: A | Discharge status: Home / Self Care | Payer: 59 | Source: Ambulatory Visit | Attending: Physician Assistant | Admitting: Physician Assistant

## 2021-06-01 ENCOUNTER — Other Ambulatory Visit: Payer: Self-pay | Admitting: Family Medicine

## 2021-06-01 DIAGNOSIS — M0579 Rheumatoid arthritis with rheumatoid factor of multiple sites without organ or systems involvement: Secondary | ICD-10-CM | POA: Diagnosis not present

## 2021-06-01 DIAGNOSIS — G629 Polyneuropathy, unspecified: Secondary | ICD-10-CM

## 2021-06-01 MED ORDER — FAMOTIDINE 20 MG PO TABS: 20.0000 mg | ORAL_TABLET | Freq: Once | ORAL | Status: DC

## 2021-06-01 MED ORDER — ACETAMINOPHEN 500 MG PO TABS: 1000.0000 mg | ORAL_TABLET | Freq: Once | ORAL | Status: DC

## 2021-06-01 MED ORDER — SODIUM CHLORIDE 0.9 % IV SOLN
2.0000 mg/kg | Freq: Once | INTRAVENOUS | Status: DC
  Administered 2021-06-01: 105 mg via INTRAVENOUS
  Filled 2021-06-01: qty 8.4

## 2021-06-03 ENCOUNTER — Ambulatory Visit (INDEPENDENT_AMBULATORY_CARE_PROVIDER_SITE_OTHER): Payer: 59 | Admitting: Family Medicine

## 2021-06-03 ENCOUNTER — Encounter: Payer: Self-pay | Admitting: Family Medicine

## 2021-06-03 ENCOUNTER — Other Ambulatory Visit: Payer: Self-pay

## 2021-06-03 VITALS — BP 129/79 | HR 70 | Temp 97.7°F | Ht 64.0 in | Wt 117.2 lb

## 2021-06-03 DIAGNOSIS — M0579 Rheumatoid arthritis with rheumatoid factor of multiple sites without organ or systems involvement: Secondary | ICD-10-CM | POA: Diagnosis not present

## 2021-06-03 DIAGNOSIS — M545 Low back pain, unspecified: Secondary | ICD-10-CM | POA: Diagnosis not present

## 2021-06-03 DIAGNOSIS — Z0271 Encounter for disability determination: Secondary | ICD-10-CM | POA: Diagnosis not present

## 2021-06-03 NOTE — Progress Notes (Signed)
CARLIYAH SKEEL - 65 y.o. female MRN [id number]  Date of birth: 29-Sep-1955  Office Visit Note: Visit Date: 05/20/2021 PCP: Loman Brooklyn, FNP Referred by: Loman Brooklyn, FNP  Subjective: Chief Complaint  Patient presents with   Lower Back - Pain   Right Hip - Pain   Left Hip - Pain   Left Leg - Pain   Right Leg - Pain   HPI:  LATAUNYA WILLBANKS is a 65 y.o. female who comes in today at the request of Dr. Eunice Blase for planned Right L5-S1 Lumbar Interlaminar epidural steroid injection with fluoroscopic guidance.  The patient has failed conservative care including home exercise, medications, time and activity modification.  This injection will be diagnostic and hopefully therapeutic.  Please see requesting physician notes for further details and justification.  She reports 10 out of 10 pain in the lower back and down both legs.  MRI had not been approved and through Dr. Junius Roads she wanted injection to see if it would help with her pain.  It was also denied and she is self-pay.   ROS Otherwise per HPI.  Assessment & Plan: Visit Diagnoses:    ICD-10-CM   1. Lumbar radiculopathy  M54.16 XR C-ARM NO REPORT    Epidural Steroid injection    betamethasone acetate-betamethasone sodium phosphate (CELESTONE) injection 12 mg      Plan: No additional findings.   Meds & Orders:  Meds ordered this encounter  Medications   betamethasone acetate-betamethasone sodium phosphate (CELESTONE) injection 12 mg    Orders Placed This Encounter  Procedures   XR C-ARM NO REPORT   Epidural Steroid injection    Follow-up: Return if symptoms worsen or fail to improve.   Procedures: No procedures performed  Lumbar Epidural Steroid Injection - Interlaminar Approach with Fluoroscopic Guidance  Patient: MAURIAH HENNICK      Date of Birth: 08-30-1956 MRN: [id number] PCP: Loman Brooklyn, FNP      Visit Date: 05/20/2021   Universal Protocol:     Consent Given By: the patient  Position:  PRONE  Additional Comments: Vital signs were monitored before and after the procedure. Patient was prepped and draped in the usual sterile fashion. The correct patient, procedure, and site was verified.   Injection Procedure Details:   Procedure diagnoses: Lumbar radiculopathy [M54.16]   Meds Administered:  Meds ordered this encounter  Medications   betamethasone acetate-betamethasone sodium phosphate (CELESTONE) injection 12 mg     Laterality: Right  Location/Site:  L5-S1  Needle: 3.5 in., 20 ga. Tuohy  Needle Placement: Paramedian epidural  Findings:   -Comments: Excellent flow of contrast into the epidural space.  Procedure Details: Using a paramedian approach from the side mentioned above, the region overlying the inferior lamina was localized under fluoroscopic visualization and the soft tissues overlying this structure were infiltrated with 4 ml. of 1% Lidocaine without Epinephrine. The Tuohy needle was inserted into the epidural space using a paramedian approach.   The epidural space was localized using loss of resistance along with counter oblique bi-planar fluoroscopic views.  After negative aspirate for air, blood, and CSF, a 2 ml. volume of Isovue-250 was injected into the epidural space and the flow of contrast was observed. Radiographs were obtained for documentation purposes.    The injectate was administered into the level noted above.   Additional Comments:  The patient tolerated the procedure well Dressing: 2 x 2 sterile gauze and Band-Aid    Post-procedure details: Patient was observed  during the procedure. Post-procedure instructions were reviewed.  Patient left the clinic in stable condition.   Clinical History: No specialty comments available.     Objective:  VS:  HT:    WT:   BMI:     BP:97/67  HR:88bpm  TEMP: ( )  RESP:  Physical Exam Vitals and nursing note reviewed.  Constitutional:      General: She is not in acute distress.     Appearance: Normal appearance. She is not ill-appearing.  HENT:     Head: Normocephalic and atraumatic.     Right Ear: External ear normal.     Left Ear: External ear normal.  Eyes:     Extraocular Movements: Extraocular movements intact.  Cardiovascular:     Rate and Rhythm: Normal rate.     Pulses: Normal pulses.  Pulmonary:     Effort: Pulmonary effort is normal. No respiratory distress.  Abdominal:     General: There is no distension.     Palpations: Abdomen is soft.  Musculoskeletal:        General: Tenderness present.     Cervical back: Neck supple.     Right lower leg: No edema.     Left lower leg: No edema.     Comments: Patient has good distal strength with no pain over the greater trochanters.  No clonus or focal weakness.  Skin:    Findings: No erythema, lesion or rash.  Neurological:     General: No focal deficit present.     Mental Status: She is alert and oriented to person, place, and time.     Sensory: No sensory deficit.     Motor: No weakness or abnormal muscle tone.     Coordination: Coordination normal.  Psychiatric:        Mood and Affect: Mood normal.        Behavior: Behavior normal.     Imaging: No results found.

## 2021-06-03 NOTE — Procedures (Signed)
Lumbar Epidural Steroid Injection - Interlaminar Approach with Fluoroscopic Guidance  Patient: Erica Beasley      Date of Birth: 1956-05-11 MRN: [id number] PCP: Loman Brooklyn, FNP      Visit Date: 05/20/2021   Universal Protocol:     Consent Given By: the patient  Position: PRONE  Additional Comments: Vital signs were monitored before and after the procedure. Patient was prepped and draped in the usual sterile fashion. The correct patient, procedure, and site was verified.   Injection Procedure Details:   Procedure diagnoses: Lumbar radiculopathy [M54.16]   Meds Administered:  Meds ordered this encounter  Medications   betamethasone acetate-betamethasone sodium phosphate (CELESTONE) injection 12 mg     Laterality: Right  Location/Site:  L5-S1  Needle: 3.5 in., 20 ga. Tuohy  Needle Placement: Paramedian epidural  Findings:   -Comments: Excellent flow of contrast into the epidural space.  Procedure Details: Using a paramedian approach from the side mentioned above, the region overlying the inferior lamina was localized under fluoroscopic visualization and the soft tissues overlying this structure were infiltrated with 4 ml. of 1% Lidocaine without Epinephrine. The Tuohy needle was inserted into the epidural space using a paramedian approach.   The epidural space was localized using loss of resistance along with counter oblique bi-planar fluoroscopic views.  After negative aspirate for air, blood, and CSF, a 2 ml. volume of Isovue-250 was injected into the epidural space and the flow of contrast was observed. Radiographs were obtained for documentation purposes.    The injectate was administered into the level noted above.   Additional Comments:  The patient tolerated the procedure well Dressing: 2 x 2 sterile gauze and Band-Aid    Post-procedure details: Patient was observed during the procedure. Post-procedure instructions were reviewed.  Patient left the  clinic in stable condition.

## 2021-06-05 ENCOUNTER — Encounter: Payer: Self-pay | Admitting: Family Medicine

## 2021-06-05 NOTE — Progress Notes (Signed)
Assessment & Plan:  1. Disability examination After discussing with patient, I feel it would be most appropriate if the treated provider for her rheumatoid arthritis complete her disability forms.   2. Rheumatoid arthritis involving multiple sites with positive rheumatoid factor ([place]) Managed by rheumatology. Encouraged her to follow-up with them regarding uncontrolled pain.  3. Right lumbar pain - Ambulatory referral to Physical Therapy   Hendricks Limes, MSN, APRN, FNP-C [facility]  Subjective:    Patient ID: Erica Beasley, female    DOB: [birth year], 65 y.o.   MRN: [id number]  Patient Care Team: Loman Brooklyn, FNP as PCP - General (Family Medicine) Gala Romney Cristopher Estimable, MD as Consulting Physician (Gastroenterology) Liana Gerold, MD as Consulting Physician (Nephrology) Jacquelin Hawking, NP as Nurse Practitioner (Oncology) [facility] as Consulting Physician (Rheumatology)   Chief Complaint:  Chief Complaint  Patient presents with   disability paperwork     HPI: Erica Beasley is a 65 y.o. female presenting on 06/03/2021 for disability paperwork   Patient is here with disability paperwork to be completed. She has been on disability, this is not new. This was originally completed for her rheumatoid arthritis. She still goes to [place] and sees Leafy Kindle, who manages this. It is unclear who completed her disability forms initially, but I suspect it was her rheumatologist.  New complaints: Patient reports right sided low back pain and would like me to start prescribing hydrocodone, that she currently gets from her orthopedic. She received a lumbar steroid injection on 05/20/2021. A few days later she messaged her orthopedic regarding her continued pain. She was advised she would need to get further refills of hydrocodone from her PCP it the pain continues, but that it could take 10-14 days to see if the injection is going to  be helpful. She declined physical therapy when it was offered to her. Upon further questioning in the office she mentions it is just her all over pain from her rheumatoid arthritis.    Social history:  Relevant past medical, surgical, family and social history reviewed and updated as indicated. Interim medical history since our last visit reviewed.  Allergies and medications reviewed and updated.  DATA REVIEWED: CHART IN EPIC  ROS: Negative unless specifically indicated above in HPI.    Current Outpatient Medications:    albuterol (VENTOLIN HFA) 108 (90 Base) MCG/ACT inhaler, Inhale 2 puffs into the lungs every 6 (six) hours as needed for wheezing or shortness of breath., Disp: 18 g, Rfl: 2   aspirin 81 MG EC tablet, Take 1 tablet (81 mg total) by mouth daily. Swallow whole., Disp: 30 tablet, Rfl: 12   baclofen (LIORESAL) 10 MG tablet, Take 0.5-1 tablets (5-10 mg total) by mouth 3 (three) times daily as needed for muscle spasms., Disp: 30 each, Rfl: 3   Cholecalciferol (VITAMIN D3) 125 MCG (5000 UT) TABS, Take 1 tablet (5,000 Units total) by mouth daily., Disp: 30 tablet, Rfl: 3   citalopram (CELEXA) 40 MG tablet, Take 1 tablet (40 mg total) by mouth daily., Disp: 90 tablet, Rfl: 1   gabapentin (NEURONTIN) 300 MG capsule, TAKE (1) CAPSULE THREE TIMES DAILY., Disp: 90 capsule, Rfl: 0   HYDROcodone-acetaminophen (NORCO/VICODIN) 5-325 MG tablet, Take 1 tablet by mouth every 6 (six) hours as needed for moderate pain., Disp: 20 tablet, Rfl: 0   hydroxychloroquine (PLAQUENIL) 200 MG tablet, Take 200 mg by mouth daily. , Disp: , Rfl:    leflunomide (ARAVA) 20 MG  tablet, Take 20 mg by mouth daily., Disp: , Rfl:    levothyroxine (SYNTHROID) 100 MCG tablet, Take 1 tablet (100 mcg total) by mouth every morning., Disp: 90 tablet, Rfl: 1   lisinopril (ZESTRIL) 10 MG tablet, Take 1 tablet (10 mg total) by mouth daily. (Patient taking differently: Take 5 mg by mouth daily.), Disp: 90 tablet, Rfl: 1    loperamide (IMODIUM A-D) 2 MG tablet, Take 1 tablet (2 mg total) by mouth in the morning. Take second dose if needed (Patient taking differently: Take 2 mg by mouth as needed. Take second dose if needed), Disp: 30 tablet, Rfl: 0   Pancrelipase, Lip-Prot-Amyl, (CREON) 24000-76000 units CPEP, Take 1-2 capsules (24,000-48,000 Units total) by mouth See admin instructions. Take 48,000 units (2 capsules) with meals and 24000 units (1 capsule) with snacks, Disp: 240 capsule, Rfl: 3   pantoprazole (PROTONIX) 40 MG tablet, TAKE ONE TABLET BY MOUTH DAILY BEFORE BREAKFAST, Disp: 90 tablet, Rfl: 1   potassium chloride SA (KLOR-CON) 20 MEQ tablet, Take 1 tablet (20 mEq total) by mouth daily., Disp: 90 tablet, Rfl: 1   pravastatin (PRAVACHOL) 40 MG tablet, TAKE ONE TABLET AT BEDTIME, Disp: 90 tablet, Rfl: 0   predniSONE (DELTASONE) 5 MG tablet, Take 5 mg by mouth daily., Disp: , Rfl:    raloxifene (EVISTA) 60 MG tablet, 1 tablet, Disp: , Rfl:    sulfaDIAZINE 500 MG tablet, Take 500 mg by mouth 2 (two) times daily., Disp: , Rfl:    sulfaSALAzine (AZULFIDINE) 500 MG tablet, 1 tablet, Disp: , Rfl:    traZODone (DESYREL) 100 MG tablet, TAKE 1 TABLET AT BEDTIME AS NEEDED FOR SLEEP, Disp: 90 tablet, Rfl: 1   Allergies  Allergen Reactions   Adalimumab     Other reaction(s): no response   Past Medical History:  Diagnosis Date   Aortic atherosclerosis ([place]) 11/13/2020   CKD (chronic kidney disease) stage 2, GFR 60-89 ml/min    COPD (chronic obstructive pulmonary disease) ([place])    COVID-19 06/2019   Depression    Essential hypertension    GERD (gastroesophageal reflux disease)    History of diabetes mellitus    Hyperlipidemia    Hypothyroidism    Neuropathy    Rheumatoid arthritis ([facility])    Vitamin B12 deficiency    Vitamin D deficiency     Past Surgical History:  Procedure Laterality Date   BIOPSY  09/16/2019   Procedure: BIOPSY;  Surgeon: Daneil Dolin, MD;  [facility];  Service:  Endoscopy;;   BIOPSY  04/28/2020   Procedure: BIOPSY;  Surgeon: Daneil Dolin, MD;  [facility];  Service: Endoscopy;;   BIOPSY  06/08/2020   Procedure: BIOPSY;  Surgeon: Daneil Dolin, MD;  [facility];  Service: Endoscopy;;   CATARACT EXTRACTION Bilateral    COLONOSCOPY  09/2010   Dr. Posey Pronto: mild diverticulsis in sigmoid colon.    COLONOSCOPY WITH PROPOFOL N/A 09/16/2019   Rourk: Diverticulosis, random colon biopsies negative for microscopic colitis.   COLONOSCOPY WITH PROPOFOL N/A 06/08/2020   Procedure: COLONOSCOPY WITH PROPOFOL;  Surgeon: Daneil Dolin, MD;  [facility];  Service: Endoscopy;  Laterality: N/A;  9:15am   ESOPHAGOGASTRODUODENOSCOPY (EGD) WITH PROPOFOL N/A 04/28/2020   Procedure: ESOPHAGOGASTRODUODENOSCOPY (EGD) WITH PROPOFOL;  Surgeon: Daneil Dolin, MD;  [facility];  Service: Endoscopy;  Laterality: N/A;  2:83   YAG LASER APPLICATION Left 15/17/6160   Procedure: YAG LASER APPLICATION;  Surgeon: Debe Coder  Iona Hansen, MD;  Location: [facility];  Service: Ophthalmology;  Laterality: Left;    Social History   Socioeconomic History   Marital status: Widowed    Spouse name: Not on file   Number of children: 1   Years of education: Not on file   Highest education level: Not on file  Occupational History   Occupation: DISABLED  Tobacco Use   Smoking status: Former    Packs/day: 1.00    Years: 15.00    Pack years: 15.00    Types: Cigarettes    Quit date: 09/11/2017    Years since quitting: 3.7   Smokeless tobacco: Former  Scientific laboratory technician Use: Never used  Substance and Sexual Activity   Alcohol use: Never   Drug use: Never   Sexual activity: Not Currently  Other Topics Concern   Not on file  Social History Narrative   Not on file   Social Determinants of Health   Financial Resource Strain: Low Risk    Difficulty of Paying Living Expenses: Not hard at all  Food Insecurity: No Food Insecurity   Worried About  Charity fundraiser in the Last Year: Never true   [place] in the Last Year: Never true  Transportation Needs: No Transportation Needs   Lack of Transportation (Medical): No   Lack of Transportation (Non-Medical): No  Physical Activity: Insufficiently Active   Days of Exercise per Week: 3 days   Minutes of Exercise per Session: 30 min  Stress: No Stress Concern Present   Feeling of Stress : Only a little  Social Connections: Socially Isolated   Frequency of Communication with Friends and Family: More than three times a week   Frequency of Social Gatherings with Friends and Family: More than three times a week   Attends Religious Services: Never   Marine scientist or Organizations: No   Attends Archivist Meetings: Never   Marital Status: Widowed  Human resources officer Violence: Not At Risk   Fear of Current or Ex-Partner: No   Emotionally Abused: No   Physically Abused: No   Sexually Abused: No        Objective:    BP 129/79   Pulse 70   Temp 97.7 F (36.5 C) (Temporal)   Ht '5\' 4"'  (1.626 m)   Wt 117 lb 3.2 oz (53.2 kg)   SpO2 95%   BMI 20.12 kg/m   Wt Readings from Last 3 Encounters:  06/03/21 117 lb 3.2 oz (53.2 kg)  06/01/21 116 lb (52.6 kg)  04/10/21 116 lb (52.6 kg)    Physical Exam Vitals reviewed.  Constitutional:      General: She is not in acute distress.    Appearance: Normal appearance. She is not ill-appearing, toxic-appearing or diaphoretic.  HENT:     Head: Normocephalic and atraumatic.  Eyes:     General: No scleral icterus.       Right eye: No discharge.        Left eye: No discharge.     Conjunctiva/sclera: Conjunctivae normal.  Cardiovascular:     Rate and Rhythm: Normal rate.  Pulmonary:     Effort: Pulmonary effort is normal. No respiratory distress.  Musculoskeletal:        General: Normal range of motion.     Cervical back: Normal range of motion.     Lumbar back: Tenderness (right side) present.  Skin:     General: Skin is warm and dry.  Capillary Refill: Capillary refill takes less than 2 seconds.  Neurological:     General: No focal deficit present.     Mental Status: She is alert and oriented to person, place, and time. Mental status is at baseline.  Psychiatric:        Mood and Affect: Mood normal.        Behavior: Behavior normal.        Thought Content: Thought content normal.        Judgment: Judgment normal.    Lab Results  Component Value Date   TSH 4.520 (H) 02/16/2021   Lab Results  Component Value Date   WBC 6.2 03/09/2021   HGB 9.1 (L) 03/09/2021   HCT 30.3 (L) 03/09/2021   MCV 93.5 03/09/2021   PLT 371 03/09/2021   Lab Results  Component Value Date   NA 139 02/16/2021   K 4.0 02/16/2021   CO2 25 02/16/2021   GLUCOSE 169 (H) 02/16/2021   BUN 10 02/16/2021   CREATININE 1.01 (H) 02/16/2021   BILITOT <0.2 02/16/2021   ALKPHOS 136 (H) 02/16/2021   AST 6 02/16/2021   ALT 6 02/16/2021   PROT 7.0 02/16/2021   ALBUMIN 3.7 (L) 02/16/2021   CALCIUM 9.5 02/16/2021   ANIONGAP 9 12/22/2020   EGFR 62 02/16/2021   Lab Results  Component Value Date   CHOL 201 (H) 11/13/2020   Lab Results  Component Value Date   HDL 82 11/13/2020   Lab Results  Component Value Date   LDLCALC 96 11/13/2020   Lab Results  Component Value Date   TRIG 136 11/13/2020   Lab Results  Component Value Date   CHOLHDL 2.5 11/13/2020   Lab Results  Component Value Date   HGBA1C 6.3 02/16/2021

## 2021-06-07 ENCOUNTER — Encounter: Payer: Self-pay | Admitting: Physical Medicine and Rehabilitation

## 2021-06-07 DIAGNOSIS — M5416 Radiculopathy, lumbar region: Secondary | ICD-10-CM

## 2021-06-14 ENCOUNTER — Inpatient Hospital Stay (HOSPITAL_COMMUNITY): Payer: 59 | Attending: Hematology

## 2021-06-14 ENCOUNTER — Other Ambulatory Visit: Payer: Self-pay | Admitting: Family Medicine

## 2021-06-14 ENCOUNTER — Other Ambulatory Visit: Payer: Self-pay

## 2021-06-14 DIAGNOSIS — D649 Anemia, unspecified: Secondary | ICD-10-CM

## 2021-06-14 DIAGNOSIS — Z23 Encounter for immunization: Secondary | ICD-10-CM | POA: Diagnosis not present

## 2021-06-14 DIAGNOSIS — C9000 Multiple myeloma not having achieved remission: Secondary | ICD-10-CM | POA: Insufficient documentation

## 2021-06-14 DIAGNOSIS — E876 Hypokalemia: Secondary | ICD-10-CM

## 2021-06-14 DIAGNOSIS — D472 Monoclonal gammopathy: Secondary | ICD-10-CM

## 2021-06-14 LAB — CBC WITH DIFFERENTIAL/PLATELET
Abs Immature Granulocytes: 0.01 10*3/uL (ref 0.00–0.07)
Basophils Absolute: 0 10*3/uL (ref 0.0–0.1)
Basophils Relative: 0 %
Eosinophils Absolute: 0.1 10*3/uL (ref 0.0–0.5)
Eosinophils Relative: 2 %
HCT: 30.7 % — ABNORMAL LOW (ref 36.0–46.0)
Hemoglobin: 8.8 g/dL — ABNORMAL LOW (ref 12.0–15.0)
Immature Granulocytes: 0 %
Lymphocytes Relative: 38 %
Lymphs Abs: 2.6 10*3/uL (ref 0.7–4.0)
MCH: 29.9 pg (ref 26.0–34.0)
MCHC: 28.7 g/dL — ABNORMAL LOW (ref 30.0–36.0)
MCV: 104.4 fL — ABNORMAL HIGH (ref 80.0–100.0)
Monocytes Absolute: 0.6 10*3/uL (ref 0.1–1.0)
Monocytes Relative: 8 %
Neutro Abs: 3.5 10*3/uL (ref 1.7–7.7)
Neutrophils Relative %: 52 %
Platelets: 306 10*3/uL (ref 150–400)
RBC: 2.94 MIL/uL — ABNORMAL LOW (ref 3.87–5.11)
RDW: 17.4 % — ABNORMAL HIGH (ref 11.5–15.5)
WBC: 6.9 10*3/uL (ref 4.0–10.5)
nRBC: 0 % (ref 0.0–0.2)

## 2021-06-14 LAB — COMPREHENSIVE METABOLIC PANEL
ALT: 8 U/L (ref 0–44)
AST: 9 U/L — ABNORMAL LOW (ref 15–41)
Albumin: 3.1 g/dL — ABNORMAL LOW (ref 3.5–5.0)
Alkaline Phosphatase: 96 U/L (ref 38–126)
Anion gap: 8 (ref 5–15)
BUN: 8 mg/dL (ref 8–23)
CO2: 28 mmol/L (ref 22–32)
Calcium: 7.6 mg/dL — ABNORMAL LOW (ref 8.9–10.3)
Chloride: 102 mmol/L (ref 98–111)
Creatinine, Ser: 0.77 mg/dL (ref 0.44–1.00)
GFR, Estimated: >60 mL/min (ref >60)
Glucose, Bld: 178 mg/dL — ABNORMAL HIGH (ref 70–99)
Potassium: 3.5 mmol/L (ref 3.5–5.1)
Sodium: 138 mmol/L (ref 135–145)
Total Bilirubin: 0.4 mg/dL (ref 0.3–1.2)
Total Protein: 6.1 g/dL — ABNORMAL LOW (ref 6.5–8.1)

## 2021-06-14 LAB — IRON AND TIBC
Iron: 49 ug/dL (ref 28–170)
Saturation Ratios: 22 % (ref 10.4–31.8)
TIBC: 219 ug/dL — ABNORMAL LOW (ref 250–450)
UIBC: 170 ug/dL

## 2021-06-14 LAB — FERRITIN: Ferritin: 279 ng/mL (ref 11–307)

## 2021-06-14 LAB — VITAMIN B12: Vitamin B-12: 325 pg/mL (ref 180–914)

## 2021-06-14 LAB — VITAMIN D 25 HYDROXY (VIT D DEFICIENCY, FRACTURES): Vit D, 25-Hydroxy: 96.67 ng/mL (ref 30–100)

## 2021-06-14 LAB — LACTATE DEHYDROGENASE: LDH: 164 U/L (ref 98–192)

## 2021-06-15 LAB — PROTEIN ELECTROPHORESIS, SERUM
A/G Ratio: 1 (ref 0.7–1.7)
Albumin ELP: 2.9 g/dL (ref 2.9–4.4)
Alpha-1-Globulin: 0.4 g/dL (ref 0.0–0.4)
Alpha-2-Globulin: 0.7 g/dL (ref 0.4–1.0)
Beta Globulin: 0.9 g/dL (ref 0.7–1.3)
Gamma Globulin: 1 g/dL (ref 0.4–1.8)
Globulin, Total: 3 g/dL (ref 2.2–3.9)
Total Protein ELP: 5.9 g/dL — ABNORMAL LOW (ref 6.0–8.5)

## 2021-06-15 LAB — KAPPA/LAMBDA LIGHT CHAINS
Kappa free light chain: 44.2 mg/L — ABNORMAL HIGH (ref 3.3–19.4)
Kappa, lambda light chain ratio: 1.7 — ABNORMAL HIGH (ref 0.26–1.65)
Lambda free light chains: 26 mg/L (ref 5.7–26.3)

## 2021-06-17 ENCOUNTER — Encounter: Payer: Self-pay | Admitting: Physical Therapy

## 2021-06-17 ENCOUNTER — Ambulatory Visit: Payer: 59 | Attending: Family Medicine | Admitting: Physical Therapy

## 2021-06-17 ENCOUNTER — Other Ambulatory Visit: Payer: Self-pay

## 2021-06-17 DIAGNOSIS — M5441 Lumbago with sciatica, right side: Secondary | ICD-10-CM | POA: Diagnosis not present

## 2021-06-17 DIAGNOSIS — R293 Abnormal posture: Secondary | ICD-10-CM | POA: Diagnosis present

## 2021-06-17 LAB — IMMUNOFIXATION ELECTROPHORESIS
IgA: 320 mg/dL (ref 87–352)
IgG (Immunoglobin G), Serum: 987 mg/dL (ref 586–1602)
IgM (Immunoglobulin M), Srm: 176 mg/dL (ref 26–217)
Total Protein ELP: 6.1 g/dL (ref 6.0–8.5)

## 2021-06-17 NOTE — Therapy (Signed)
[facility] [place], [place], [address] Phone: [phone]   Fax:  [phone]  Physical Therapy Evaluation  Patient Details  Name: Erica Beasley MRN: [id number] Date of Birth: 1956-08-22 Referring Provider (PT): Hendricks Limes   Encounter Date: 06/17/2021   PT End of Session - 06/17/21 1147     Visit Number 1    Number of Visits 10    Date for PT Re-Evaluation 07/29/21    PT Start Time 1120    PT Stop Time 1147    PT Time Calculation (min) 27 min    Activity Tolerance Patient tolerated treatment well    Behavior During Therapy [facility] for tasks assessed/performed             Past Medical History:  Diagnosis Date   Aortic atherosclerosis ([place]) 11/13/2020   CKD (chronic kidney disease) stage 2, GFR 60-89 ml/min    COPD (chronic obstructive pulmonary disease) ([place])    COVID-19 06/2019   Depression    Essential hypertension    GERD (gastroesophageal reflux disease)    History of diabetes mellitus    Hyperlipidemia    Hypothyroidism    Neuropathy    Rheumatoid arthritis ([place])    Vitamin B12 deficiency    Vitamin D deficiency     Past Surgical History:  Procedure Laterality Date   BIOPSY  09/16/2019   Procedure: BIOPSY;  Surgeon: Daneil Dolin, MD;  [facility];  Service: Endoscopy;;   BIOPSY  04/28/2020   Procedure: BIOPSY;  Surgeon: Daneil Dolin, MD;  [facility];  Service: Endoscopy;;   BIOPSY  06/08/2020   Procedure: BIOPSY;  Surgeon: Daneil Dolin, MD;  [facility];  Service: Endoscopy;;   CATARACT EXTRACTION Bilateral    COLONOSCOPY  09/2010   Dr. Posey Pronto: mild diverticulsis in sigmoid colon.    COLONOSCOPY WITH PROPOFOL N/A 09/16/2019   Rourk: Diverticulosis, random colon biopsies negative for microscopic colitis.   COLONOSCOPY WITH PROPOFOL N/A 06/08/2020   Procedure: COLONOSCOPY WITH PROPOFOL;  Surgeon: Daneil Dolin, MD;  [facility];  Service:  Endoscopy;  Laterality: N/A;  9:15am   ESOPHAGOGASTRODUODENOSCOPY (EGD) WITH PROPOFOL N/A 04/28/2020   Procedure: ESOPHAGOGASTRODUODENOSCOPY (EGD) WITH PROPOFOL;  Surgeon: Daneil Dolin, MD;  [facility];  Service: Endoscopy;  Laterality: N/A;  0:25   YAG LASER APPLICATION Left 42/70/6237   Procedure: YAG LASER APPLICATION;  Surgeon: Williams Che, MD;  Location: [facility];  Service: Ophthalmology;  Laterality: Left;    There were no vitals filed for this visit.    Subjective Assessment - 06/17/21 1155     Subjective COVID-19 screen performed prior to patient entering clinic.  The patient presenst to the clinic today with c/o right-sided low back pain with radiation into her right buttock, posterior thigh and calf.  She states that on 7/3 she just woke up like this.  The pain at the time was so severe she was unable to straighten up.  This eventually resulted in an ED visit.  She has an injection on 05/20/21 that was very helpful.  Prior to that she tried Chiropratic care but this was not helpful.  Her pain increases with walking after about 200 feet.  She can shop but must lean on the shopping cart.  her pain today is a 4/10 and can rise to a 6/10 with walking and standing more than 5 minutes.  Sitting and lying down decrease her  pain.    Pertinent History HTN, RA, hypothyroidism, COPD, DM.    How long can you sit comfortably? Unlimited.    How long can you stand comfortably? 5 minutes.    How long can you walk comfortably? About 200 feet unassisted.    Diagnostic tests X-ray.    Patient Stated Goals Reduce pain and be able to walk and stand longer.    Currently in Pain? Yes    Pain Score 4     Pain Location Back    Pain Orientation Right    Pain Descriptors / Indicators Aching    Pain Type Acute pain    Pain Onset More than a month ago    Pain Frequency Constant    Aggravating Factors  See above.    Pain Relieving Factors See above.                [facility] PT Assessment  - 06/17/21 0001       Assessment   Medical Diagnosis Right lumbar pain.    Referring Provider (PT) Hendricks Limes    Onset Date/Surgical Date 03/28/21      Precautions   Precautions None      Restrictions   Weight Bearing Restrictions No      Balance Screen   Has the patient fallen in the past 6 months No    Has the patient had a decrease in activity level because of a fear of falling?  No    Is the patient reluctant to leave their home because of a fear of falling?  No      Home Environment   Living Environment Private residence      Prior Function   Level of Independence Independent      Posture/Postural Control   Posture/Postural Control Postural limitations    Postural Limitations Rounded Shoulders;Forward head;Decreased lumbar lordosis;Flexed trunk    Posture Comments The patient stands in 10 degrees of trunk flexion and keeps her left knee flexed slighty.  She has scoliosis with convexity of left in T-spine and right convexity in T-L spine region.      Deep Tendon Reflexes   DTR Assessment Site Patella;Achilles    Patella DTR 0    Achilles DTR 0      ROM / Strength   AROM / PROM / Strength AROM;Strength      AROM   Overall AROM Comments Lumbar flexion performed slowly through 50% of range and extension is 10 degrees.  Normal right hip range of motion.      Strength   Overall Strength Comments Patient able to provide a normal strength grade via MMTing to bilateral hips, knees and ankle.      Palpation   Palpation comment tender to palpation over right SIJ, upper gluteal and Piriformis region.      Special Tests   Other special tests Equal leg lengths.  C/o stretching with a right SLR test.  No pain with a FABER test.      Transfers   Comments Sit to stand with armrests and this appeared to be painful.  Good log roll technique from supine ot sit.      Ambulation/Gait   Gait Comments The patient walks in some trunk flexion with a widened base of support.                         Objective measurements completed on examination: See above findings.  PT Long Term Goals - 06/17/21 1215       PT LONG TERM GOAL #1   Title Independent with a HEP.    Baseline No knowledge of appropriate ther ex.    Time 5    Period Weeks    Status New      PT LONG TERM GOAL #2   Title Walk 500 feet without having to rest.    Baseline Can only walk about 200 feet before sitting due to pain increase.    Time 5    Period Weeks    Status New      PT LONG TERM GOAL #3   Title Stand 20 minutes with pain not > 2-3/10.    Baseline She can only stand about 5 minutes.    Time 5    Period Weeks    Status New      PT LONG TERM GOAL #4   Title Perform ADL's with pain not > 2-3/10.    Baseline Pain rises to a 6/10 when performing ADL's.    Time 5    Period Weeks    Status New                    Plan - 06/17/21 1201     Clinical Impression Statement The patient presents to OPPT with c/o right-sided low back pain with radiation onto her right buttock, posterior thigh and calf.  She is palpably tender over her right SIJ, upper glueal and Piriformis region. She has scolosis.  Her LE strength is normal.  She lacks active lumbar flexion and extension.  Special testing is negative.  She can only walk unassisted for a short distance and cannot stand longer than 5 minutes due to pain increase.  Her fucntional mobility is impaired and transitory movements appear painful.    Personal Factors and Comorbidities Other;Comorbidity 1;Comorbidity 2    Comorbidities HTN, RA, hypothyroidism, COPD, DM.    Examination-Activity Limitations Locomotion Level;Other;Stand    Examination-Participation Restrictions Other;Shop    Stability/Clinical Decision Making Stable/Uncomplicated    PT Treatment/Interventions ADLs/Self Care Home Management;Cryotherapy;Electrical Stimulation;Ultrasound;Moist Heat;Functional mobility  training;Therapeutic activities;Therapeutic exercise;Manual techniques;Patient/family education;Passive range of motion;Dry needling    PT Next Visit Plan SKTC, hip bridges, Nustep, STW/M and modalites as needed.  Core exercise progression.    Consulted and Agree with Plan of Care Patient             Patient will benefit from skilled therapeutic intervention in order to improve the following deficits and impairments:  Abnormal gait, Decreased activity tolerance, Decreased mobility, Increased muscle spasms, Postural dysfunction, Pain  Visit Diagnosis: Acute right-sided low back pain with right-sided sciatica - Plan: PT plan of care cert/re-cert  Abnormal posture - Plan: PT plan of care cert/re-cert     Problem List Patient Active Problem List   Diagnosis Date Noted   Hypothyroidism 11/13/2020   Prediabetes 11/13/2020   B12 deficiency 11/13/2020   Chronic obstructive pulmonary disease ([place]) 11/13/2020   Vitamin D deficiency 11/13/2020   Neuropathy 11/13/2020   Rheumatoid arthritis involving multiple sites ([place]) 11/13/2020   Mixed hyperlipidemia 11/13/2020   Aortic atherosclerosis ([place]) 11/13/2020   GERD (gastroesophageal reflux disease) 11/05/2020   Depression, recurrent ([place]) 09/22/2020   Essential hypertension 09/22/2020   Dehydration 08/13/2020   Hypokalemia 08/13/2020   Abnormal CT of the abdomen 06/02/2020   N&V (nausea and vomiting) 04/08/2020   Monoclonal gammopathy 04/03/2020   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 03/26/2020   Hx  of colonic polyps 05/03/2019   H/O Clostridium difficile infection 05/03/2019   Normocytic anemia 05/03/2019   Chronic diarrhea 01/29/2019    Zaydrian Batta, [place], PT 06/17/2021, 12:18 PM  [facility] [address] [place], [place], [address] Phone: [phone]   Fax:  [phone]  Name: Erica Beasley MRN: [id number] Date of Birth: 12-23-1955

## 2021-06-20 ENCOUNTER — Other Ambulatory Visit: Payer: Self-pay

## 2021-06-20 ENCOUNTER — Ambulatory Visit
Admission: RE | Admit: 2021-06-20 | Discharge: 2021-06-20 | Disposition: A | Discharge status: Home / Self Care | Payer: Medicaid Other | Source: Ambulatory Visit | Attending: Physical Medicine and Rehabilitation | Admitting: Physical Medicine and Rehabilitation

## 2021-06-20 DIAGNOSIS — M5416 Radiculopathy, lumbar region: Secondary | ICD-10-CM

## 2021-06-20 IMAGING — MR MR LUMBAR SPINE W/O CM
5 of 6 series · 30 of 48 positions shown · non-contrast
Comparison: Radiograph from [DATE].

CLINICAL DATA: Initial evaluation for low back pain with radiation
into the right lower extremity.

EXAM:
MRI LUMBAR SPINE WITHOUT CONTRAST
TECHNIQUE: Multiplanar, multisequence MR imaging of the lumbar spine was
performed. No intravenous contrast was administered.

[Series 3: T2 · sagittal · 4.0mm · 1.09mm/px · 6 of 19 slices shown (1 of 3)]
[im 1/19]
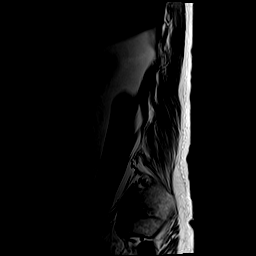
[im 4/19]
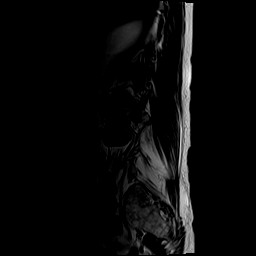
[im 8/19]
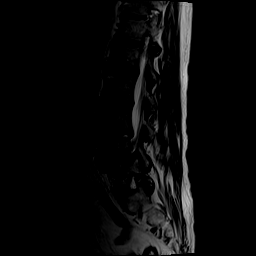
[im 11/19]
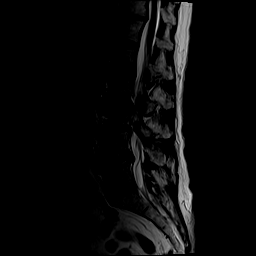
[im 15/19]
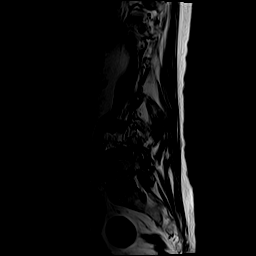
[im 19/19]
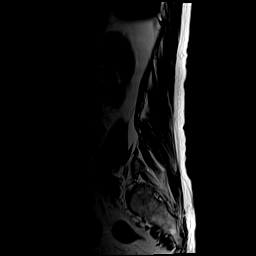

[Series 5: T1 · sagittal · 4.0mm · 1.09mm/px · 6 of 19 slices shown (1 of 2)]
[im 1/19]
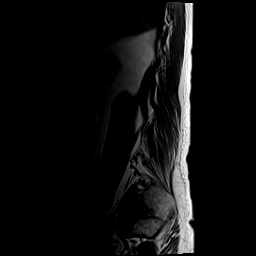
[im 4/19]
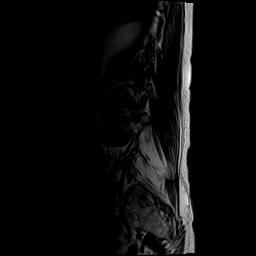
[im 8/19]
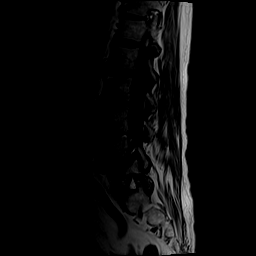
[im 11/19]
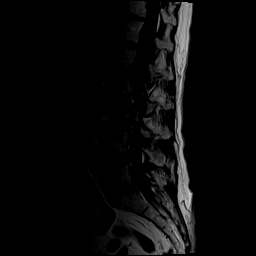
[im 15/19]
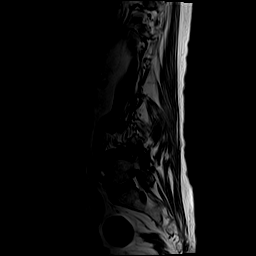
[im 19/19]
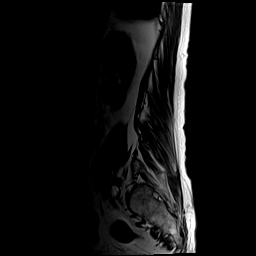

[Series 6: T2 · coronal · 4.0mm · 1.09mm/px · 6 of 19 slices shown (2 of 3)]
[im 1/19]
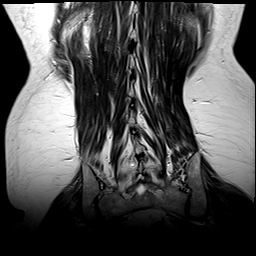
[im 4/19]
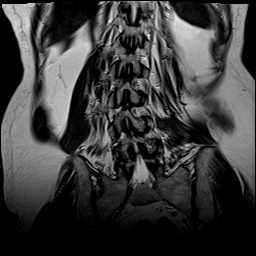
[im 8/19]
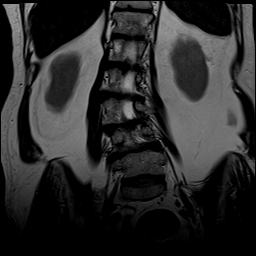
[im 11/19]
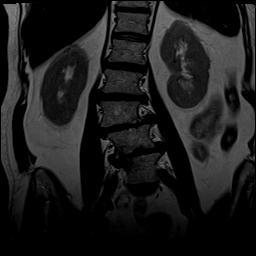
[im 15/19]
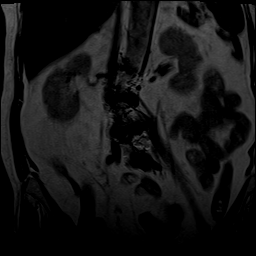
[im 19/19]
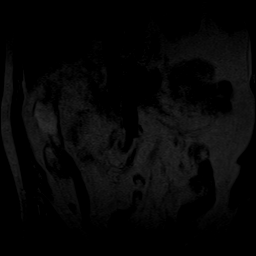

[Series 7: T2 · axial · 4.0mm · 0.39mm/px · z∈[-79,+131]mm · 9 of 41 slices shown (3 of 3)]
[im 1/41]
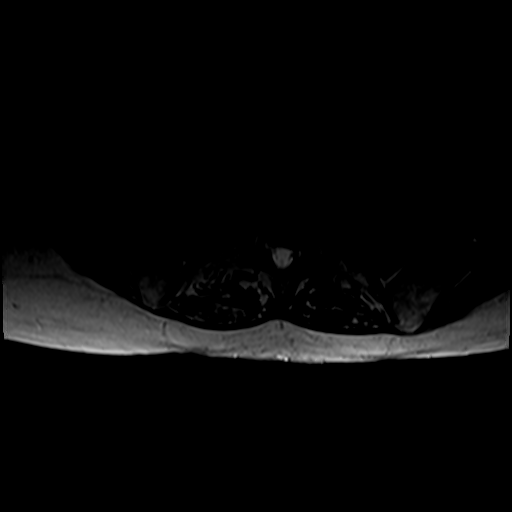
[im 8/41]
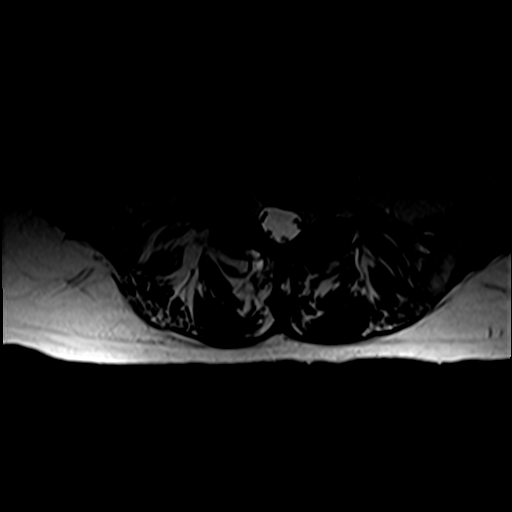
[im 11/41]
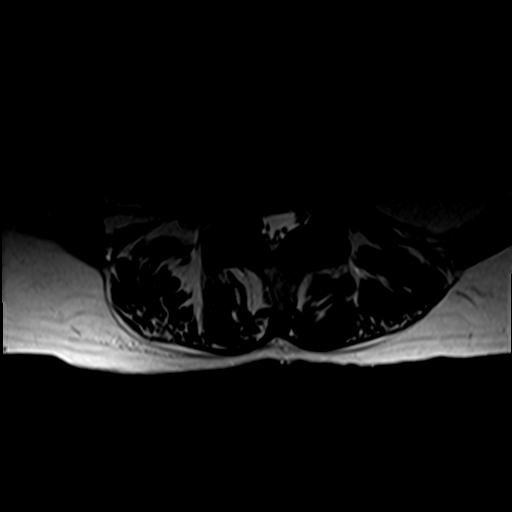
[im 19/41]
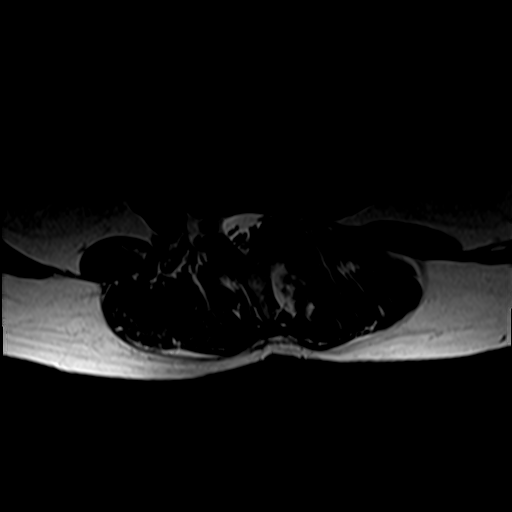
[im 22/41]
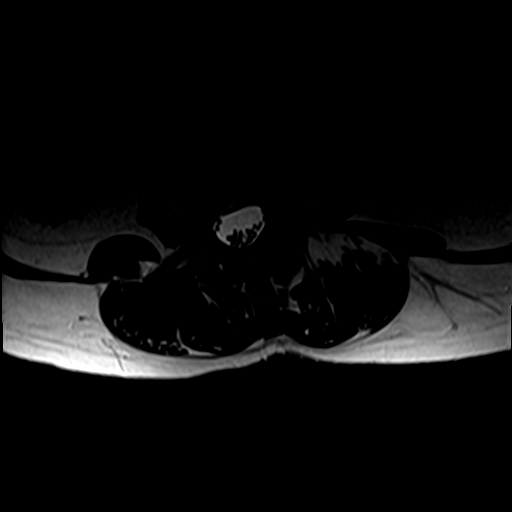
[im 30/41]
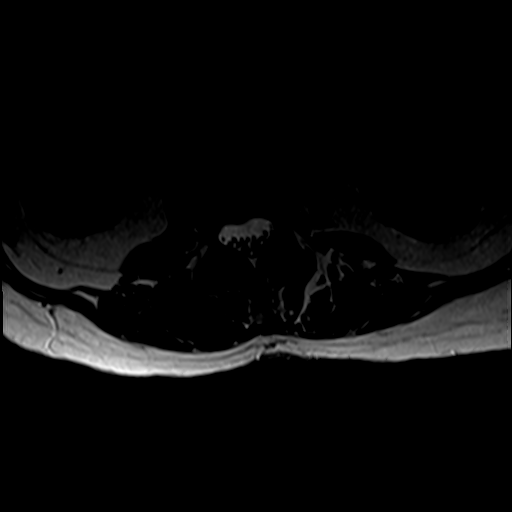
[im 33/41]
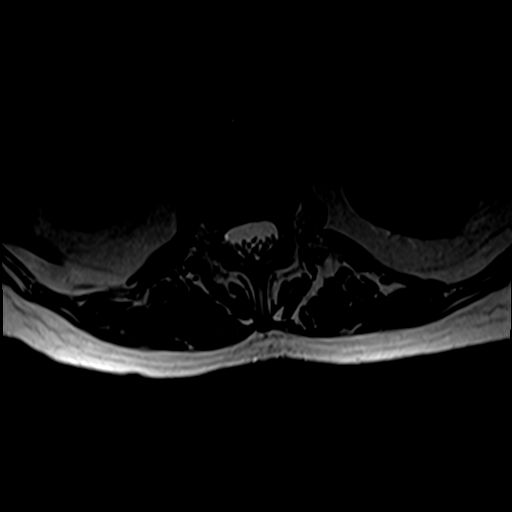
[im 37/41]
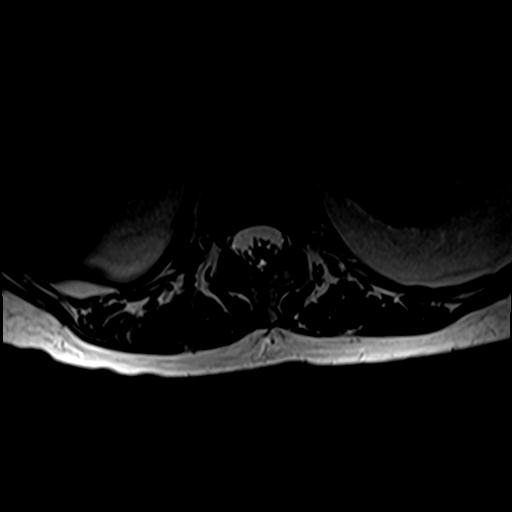
[im 41/41]
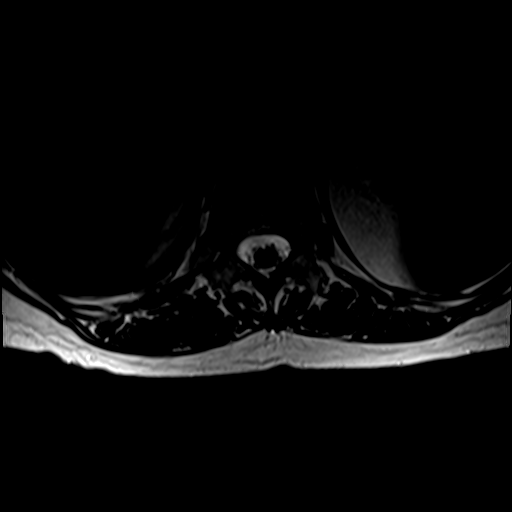

[Series 8: T1 · axial · 4.0mm · 0.39mm/px · z∈[-79,-32]mm · 3 of 41 slices shown (2 of 2)]
[im 1/41]
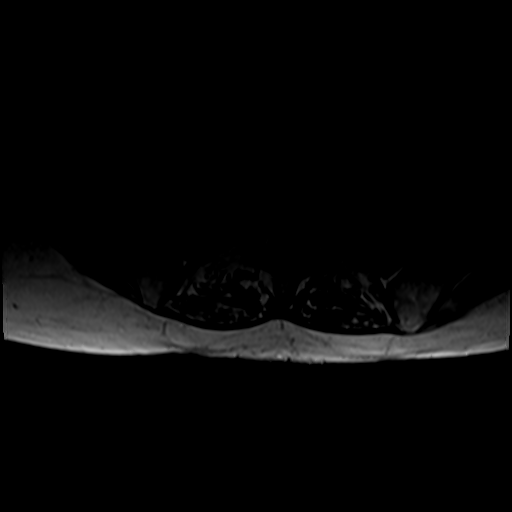
[im 8/41]
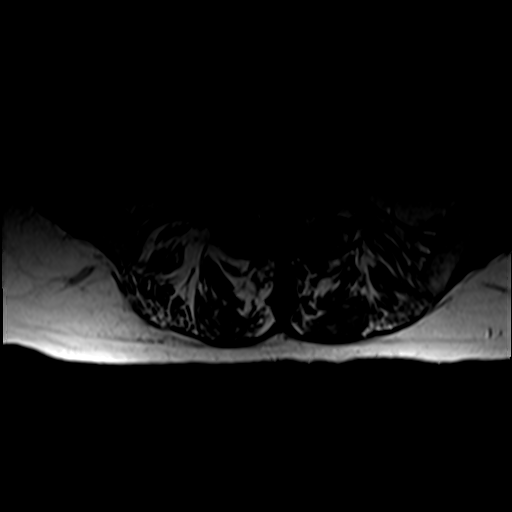
[im 11/41]
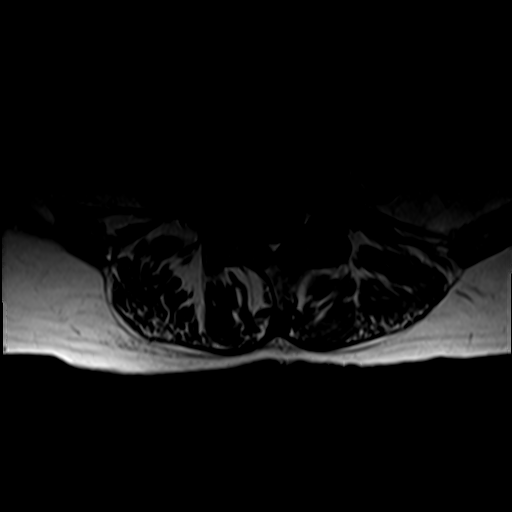

[30 of 48 positions shown; findings below may reference images not displayed]

FINDINGS: Segmentation: Standard. Lowest well-formed disc space labeled the
L5-S1 level.

Alignment: Moderate dextroscoliosis with apex at L3. Trace
retrolisthesis of T11 on T12 and L1 on L2.

Vertebrae: Vertebral body height maintained without acute or chronic
fracture. Bone marrow signal intensity within normal limits. Benign
hemangioma noted within the right pedicle of L1. No other discrete
or worrisome osseous lesions. Prominent discogenic reactive endplate
change present about the L3-4 and L4-5 interspaces related to
underlying scoliotic curvature. Mild reactive marrow edema also
noted about the left L4-5 facet due to facet arthritis.

Conus medullaris and cauda equina: Conus extends to the T12-L1
level. Conus and cauda equina appear normal.

Paraspinal and other soft tissues: Unremarkable.

Disc levels:

T11-12: Seen only on sagittal projection. Disc bulge with disc
desiccation with mild reactive endplate change. Mild facet
hypertrophy. No significant stenosis.

T12-L1: Unremarkable.

L1-2: Mild disc bulge with disc desiccation and reactive endplate
spurring. Disc bulging asymmetric to the right. Superimposed small
central disc protrusion minimally indents the ventral thecal sac.
Mild facet hypertrophy. No spinal stenosis. Mild right L1 foraminal
narrowing. Left neural foramina remains patent.

L2-3: Degenerative intervertebral disc space narrowing with diffuse
disc bulge and disc desiccation. Disc bulging eccentric to the left
with associated left foraminal to extraforaminal disc protrusion
(series 8, image 18). Mild facet and ligament flavum hypertrophy.
Mild narrowing of the left lateral recess. Central canal remains
patent. Mild to moderate left L2 foraminal narrowing. Right neural
foramina remains patent.

L3-4: Degenerative intervertebral disc space narrowing with disc
desiccation and diffuse disc bulge, asymmetric to the left.
Prominent reactive endplate spurring with marrow edema, also greater
on the left. Resultant large extraforaminal disc osteophyte complex
(series 7, image 23). Moderate left worse than right facet
hypertrophy. Resultant mild narrowing of the left lateral recess.
Central canal remains patent. Moderate left L3 foraminal stenosis.
Right neural foramina remains patent.

L4-5: Degenerative intervertebral disc space narrowing with disc
desiccation and diffuse disc bulge. Associated discogenic reactive
endplate change with marginal endplate osteophytic spurring. Disc
bulging eccentric to the right. Superimposed mild to moderate facet
hypertrophy. Resultant mild to moderate right lateral recess
narrowing. Central canal remains patent. Severe right with moderate
left L4 foraminal stenosis (series 5, image 7).

L5-S1:  Normal interspace.  Mild facet hypertrophy.  No stenosis.
IMPRESSION: 1. Right eccentric disc bulge with facet hypertrophy at L4-5 with
resultant severe right and moderate left L4 foraminal stenosis.
Query right L4 radiculitis.
2. Left eccentric disc bulge and reactive endplate changes at L3-4
with resultant moderate left L3 foraminal stenosis.
3. Left eccentric disc bulge with foraminal disc protrusion at L2-3
with resultant mild to moderate left lateral recess and foraminal
stenosis.
4. Underlying moderate dextroscoliosis.

## 2021-06-20 NOTE — Progress Notes (Signed)
Erica Beasley, Erica Beasley   CLINIC:  Medical Oncology/Hematology  PCP:  Erica Beasley, Erica Beasley  Erica Beasley  REASON FOR VISIT:  Follow-up for monoclonal gammopathy and normocytic anemia  PRIOR THERAPY: none  CURRENT THERAPY: surveillance  INTERVAL HISTORY:  Erica Beasley, a 65 y.o. female, returns for routine follow-up for her monoclonal gammopathy and normocytic anemia. Erica Beasley was last seen on 12/29/2020.  Today she reports feeling fair. She reports pain in her right hip, back and leg starting 07/03. She denies any current bleeding. She is not currently received or taking vitamin B-12; she stopped 3-4 months ago. She reports occasional numbness in her right foot and occasional ankle and foot swellings.   REVIEW OF SYSTEMS:  Review of Systems  Constitutional:  Positive for appetite change (40%) and fatigue (40%).  HENT:   Negative for nosebleeds.   Respiratory:  Negative for hemoptysis.   Cardiovascular:  Positive for leg swelling (ankle and foot).  Gastrointestinal:  Positive for diarrhea. Negative for blood in stool.  Genitourinary:  Negative for hematuria and vaginal bleeding.   Musculoskeletal:  Positive for arthralgias (8/10 R hip, shoulder, leg).  Neurological:  Positive for numbness (R foot).  Hematological:  Does not bruise/bleed easily.  All other systems reviewed and are negative.  PAST MEDICAL/SURGICAL HISTORY:  Past Medical History:  Diagnosis Date   Aortic atherosclerosis ([place]) 11/13/2020   CKD (chronic kidney disease) stage 2, GFR 60-89 ml/min    COPD (chronic obstructive pulmonary disease) ([place])    COVID-19 06/2019   Depression    Essential hypertension    GERD (gastroesophageal reflux disease)    History of diabetes mellitus    Hyperlipidemia    Hypothyroidism    Neuropathy    Rheumatoid arthritis (HCC)    Vitamin B12 deficiency    Vitamin D deficiency    Past  Surgical History:  Procedure Laterality Date   BIOPSY  09/16/2019   Procedure: BIOPSY;  Surgeon: Daneil Dolin, MD;  [facility];  Service: Endoscopy;;   BIOPSY  04/28/2020   Procedure: BIOPSY;  Surgeon: Daneil Dolin, MD;  [facility];  Service: Endoscopy;;   BIOPSY  06/08/2020   Procedure: BIOPSY;  Surgeon: Daneil Dolin, MD;  [facility];  Service: Endoscopy;;   CATARACT EXTRACTION Bilateral    COLONOSCOPY  09/2010   Dr. Posey Pronto: mild diverticulsis in sigmoid colon.    COLONOSCOPY WITH PROPOFOL N/A 09/16/2019   Rourk: Diverticulosis, random colon biopsies negative for microscopic colitis.   COLONOSCOPY WITH PROPOFOL N/A 06/08/2020   Procedure: COLONOSCOPY WITH PROPOFOL;  Surgeon: Daneil Dolin, MD;  [facility];  Service: Endoscopy;  Laterality: N/A;  9:15am   ESOPHAGOGASTRODUODENOSCOPY (EGD) WITH PROPOFOL N/A 04/28/2020   Procedure: ESOPHAGOGASTRODUODENOSCOPY (EGD) WITH PROPOFOL;  Surgeon: Daneil Dolin, MD;  [facility];  Service: Endoscopy;  Laterality: N/A;  0:17   YAG LASER APPLICATION Left 79/39/0300   Procedure: YAG LASER APPLICATION;  Surgeon: Williams Che, MD;  Location: [facility];  Service: Ophthalmology;  Laterality: Left;    SOCIAL HISTORY:  Social History   Socioeconomic History   Marital status: Widowed    Spouse name: Not on file   Number of children: 1   Years of education: Not on file   Highest education level: Not on file  Occupational History   Occupation: DISABLED  Tobacco Use  Smoking status: Former    Packs/day: 1.00    Years: 15.00    Pack years: 15.00    Types: Cigarettes    Quit date: 09/11/2017    Years since quitting: 3.7   Smokeless tobacco: Former  Scientific laboratory technician Use: Never used  Substance and Sexual Activity   Alcohol use: Never   Drug use: Never   Sexual activity: Not Currently  Other Topics Concern   Not on file  Social History Narrative   Not on file   Social  Determinants of Health   Financial Resource Strain: Low Risk    Difficulty of Paying Living Expenses: Not hard at all  Food Insecurity: No Food Insecurity   Worried About Charity fundraiser in the Last Year: Never true   [place] in the Last Year: Never true  Transportation Needs: No Transportation Needs   Lack of Transportation (Medical): No   Lack of Transportation (Non-Medical): No  Physical Activity: Insufficiently Active   Days of Exercise per Week: 3 days   Minutes of Exercise per Session: 30 min  Stress: No Stress Concern Present   Feeling of Stress : Only a little  Social Connections: Socially Isolated   Frequency of Communication with Friends and Family: More than three times a week   Frequency of Social Gatherings with Friends and Family: More than three times a week   Attends Religious Services: Never   Marine scientist or Organizations: No   Attends Archivist Meetings: Never   Marital Status: Widowed  Human resources officer Violence: Not At Risk   Fear of Current or Ex-Partner: No   Emotionally Abused: No   Physically Abused: No   Sexually Abused: No    FAMILY HISTORY:  Family History  Problem Relation Age of Onset   Stroke Mother    Heart disease Mother    Diabetes Mother    Breast cancer Mother    Other Father        stomach taken out for some reason   Migraines Daughter    Colon cancer Neg Hx     CURRENT MEDICATIONS:  Current Outpatient Medications  Medication Sig Dispense Refill   albuterol (VENTOLIN HFA) 108 (90 Base) MCG/ACT inhaler Inhale 2 puffs into the lungs every 6 (six) hours as needed for wheezing or shortness of breath. 18 g 2   aspirin 81 MG EC tablet Take 1 tablet (81 mg total) by mouth daily. Swallow whole. 30 tablet 12   baclofen (LIORESAL) 10 MG tablet Take 0.5-1 tablets (5-10 mg total) by mouth 3 (three) times daily as needed for muscle spasms. 30 each 3   Cholecalciferol (VITAMIN D3) 125 MCG (5000 UT) TABS Take 1  tablet (5,000 Units total) by mouth daily. 30 tablet 3   citalopram (CELEXA) 40 MG tablet Take 1 tablet (40 mg total) by mouth daily. 90 tablet 1   gabapentin (NEURONTIN) 300 MG capsule TAKE (1) CAPSULE THREE TIMES DAILY. 90 capsule 0   HYDROcodone-acetaminophen (NORCO/VICODIN) 5-325 MG tablet Take 1 tablet by mouth every 6 (six) hours as needed for moderate pain. 20 tablet 0   hydroxychloroquine (PLAQUENIL) 200 MG tablet Take 200 mg by mouth daily.      leflunomide (ARAVA) 20 MG tablet Take 20 mg by mouth daily.     levothyroxine (SYNTHROID) 100 MCG tablet Take 1 tablet (100 mcg total) by mouth every morning. 90 tablet 1   lisinopril (ZESTRIL) 10 MG tablet Take 1  tablet (10 mg total) by mouth daily. (Patient taking differently: Take 5 mg by mouth daily.) 90 tablet 1   loperamide (IMODIUM A-D) 2 MG tablet Take 1 tablet (2 mg total) by mouth in the morning. Take second dose if needed (Patient taking differently: Take 2 mg by mouth as needed. Take second dose if needed) 30 tablet 0   Pancrelipase, Lip-Prot-Amyl, (CREON) 24000-76000 units CPEP Take 1-2 capsules (24,000-48,000 Units total) by mouth See admin instructions. Take 48,000 units (2 capsules) with meals and 24000 units (1 capsule) with snacks 240 capsule 3   pantoprazole (PROTONIX) 40 MG tablet TAKE ONE TABLET BY MOUTH DAILY BEFORE BREAKFAST 90 tablet 1   potassium chloride SA (KLOR-CON) 20 MEQ tablet Take 1 tablet (20 mEq total) by mouth daily. (NEEDS TO BE SEEN BEFORE NEXT REFILL) 30 tablet 0   pravastatin (PRAVACHOL) 40 MG tablet TAKE ONE TABLET AT BEDTIME 90 tablet 0   predniSONE (DELTASONE) 5 MG tablet Take 5 mg by mouth daily.     raloxifene (EVISTA) 60 MG tablet 1 tablet     sulfaDIAZINE 500 MG tablet Take 500 mg by mouth 2 (two) times daily.     sulfaSALAzine (AZULFIDINE) 500 MG tablet 1 tablet     traZODone (DESYREL) 100 MG tablet TAKE 1 TABLET AT BEDTIME AS NEEDED FOR SLEEP 90 tablet 1   No current facility-administered medications  for this visit.    ALLERGIES:  Allergies  Allergen Reactions   Adalimumab     Other reaction(s): no response    PHYSICAL EXAM:  Performance status (ECOG): 1 - Symptomatic but completely ambulatory  There were no vitals filed for this visit. Wt Readings from Last 3 Encounters:  06/03/21 117 lb 3.2 oz (53.2 kg)  06/01/21 116 lb (52.6 kg)  04/10/21 116 lb (52.6 kg)   Physical Exam Vitals reviewed.  Constitutional:      Appearance: Normal appearance.  Cardiovascular:     Rate and Rhythm: Normal rate and regular rhythm.     Pulses: Normal pulses.     Heart sounds: Normal heart sounds.  Pulmonary:     Effort: Pulmonary effort is normal.     Breath sounds: Normal breath sounds.  Neurological:     General: No focal deficit present.     Mental Status: She is alert and oriented to person, place, and time.  Psychiatric:        Mood and Affect: Mood normal.        Behavior: Behavior normal.    LABORATORY DATA:  I have reviewed the labs as listed.  CBC Latest Ref Rng & Units 06/14/2021 03/09/2021 12/22/2020  WBC 4.0 - 10.5 K/uL 6.9 6.2 6.8  Hemoglobin 12.0 - 15.0 g/dL 8.8(L) 9.1(L) 9.7(L)  Hematocrit 36.0 - 46.0 % 30.7(L) 30.3(L) 33.2(L)  Platelets 150 - 400 K/uL 306 371 267   CMP Latest Ref Rng & Units 06/14/2021 02/16/2021 12/22/2020  Glucose 70 - 99 mg/dL 178(H) 169(H) 190(H)  BUN 8 - 23 mg/dL 8 10 13   Creatinine 0.44 - 1.00 mg/dL 0.77 1.01(H) 0.83  Sodium 135 - 145 mmol/L 138 139 138  Potassium 3.5 - 5.1 mmol/L 3.5 4.0 4.0  Chloride 98 - 111 mmol/L 102 99 103  CO2 22 - 32 mmol/L 28 25 26   Calcium 8.9 - 10.3 mg/dL 7.6(L) 9.5 8.6(L)  Total Protein 6.5 - 8.1 g/dL 6.1(L) 7.0 7.1  Total Bilirubin 0.3 - 1.2 mg/dL 0.4 <0.2 0.4  Alkaline Phos 38 - 126 U/L 96 136(H) 97  AST 15 - 41 U/L 9(L) 6 13(L)  ALT 0 - 44 U/L 8 6 9       Component Value Date/Time   RBC 2.94 (L) 06/14/2021 1040   MCV 104.4 (H) 06/14/2021 1040   MCV 93 11/13/2020 1129   MCH 29.9 06/14/2021 1040   MCHC  28.7 (L) 06/14/2021 1040   RDW 17.4 (H) 06/14/2021 1040   RDW 12.7 11/13/2020 1129   LYMPHSABS 2.6 06/14/2021 1040   LYMPHSABS 2.4 11/13/2020 1129   MONOABS 0.6 06/14/2021 1040   EOSABS 0.1 06/14/2021 1040   EOSABS 0.1 11/13/2020 1129   BASOSABS 0.0 06/14/2021 1040   BASOSABS 0.0 11/13/2020 1129    DIAGNOSTIC IMAGING:  I have independently reviewed the scans and discussed with the patient. No results found.   ASSESSMENT:  1.  Bence-Jones proteinuria: -24-hour urine done by Dr. Theador Hawthorne showed urine immunofixation positive for Bence-Jones proteinuria, kappa type.  24-hour total protein was 77 mg/day. -Free kappa light chains were 52.9, lambda light chains 29.8 with ratio of 1.78.  Serum immunofixation was unremarkable.  SPEP did not show any evidence of M spike. -Labs on 04/03/2020 shows negative immunofixation.  Kappa light chain 61.3, lambda light chains 32.3, ratio 1.9. -Skeletal survey on 04/03/2020 was negative for lytic lesions.   2.  Weight loss: -25 pound weight loss in the last 2 months. -She smoked cigarettes 1 pack/day for 46 years, and quit 3 years ago. -She reports that she cannot eat due to decreased appetite.  Also feels very nauseous on smelling of foods. -EGD on 04/28/2020 with stomach and duodenal biopsy negative. -CT CAP on 05/11/2020 showed long segment colonic thickening likely diverticular disease.  Stable small juxtapleural nodule in the right upper chest benign over 1 year.  Moderate large fat-containing left inguinal hernia.  Emphysema and diverticular calcification. -Colonoscopy on 06/08/2020 shows pancolonic diverticulosis, mild.  Normal-appearing colonic mucosa otherwise.  Random biopsies were negative.   3.  Normocytic anemia: -CBC on 03/20/2020 shows hemoglobin 9.9 with MCV of 93.  White count and platelets were normal. -Colonoscopy on 09/16/2019 shows diverticulosis in the entire examined colon.  Nonbleeding internal hemorrhoids.   4.  Rheumatoid  arthritis: -She is on Plaquenil and Areva for the last 4 years.   PLAN:  1.  Bence-Jones proteinuria: - We reviewed myeloma labs from 06/14/2021.  Creatinine is 0.7, calcium 7.6.  SPEP was negative for M spike. - Kappa light chain is elevated at 44.2, lambda light chain 26 and ratio of 1.7. - Serum immunofixation is unremarkable. - Reviewed MRI of the lumbar spine from 06/20/2021.  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  No lytic lesions were found.  She is having some right lower back pain radiating to the right thigh.  She will follow-up with her back doctor. - She has unexplained macrocytic anemia.  We will recommend bone marrow aspiration biopsy.   2.  Weight loss: - Continue pancrelipase 2 capsules with meals and 1 capsule with snack. - She is not requiring appetite stimulants.  Weight is stable.   3.  Macrocytic anemia: -CBC on 06/14/2021 showed hemoglobin 8.8.  MCV is 104. - Denies any bleeding per rectum or melena. - B12 was 325, ferritin 279 and percent saturation 22.  She does not have CKD.  LDH was normal.  She has very minimal elevation of free light chain ratio of 1.7.  SPEP was negative for M spike.  She reportedly stopped B12 3 to 4  months ago. - Unexplained macrocytic anemia.  Would recommend bone marrow aspiration and biopsy to evaluate for MDS.   4.  Depression: - Continue Celexa 40 mg daily.  This is well controlled.   5.  Hypokalemia: - Continue K-Dur daily.  Potassium today is normal.  Orders placed this encounter:  No orders of the defined types were placed in this encounter.    Erica Jack, MD [place] [phone]   I, Thana Ates, am acting as a scribe for Dr. Derek Beasley.  I, Erica Jack MD, have reviewed the above documentation for accuracy and completeness, and I agree with the above.

## 2021-06-21 ENCOUNTER — Inpatient Hospital Stay (HOSPITAL_BASED_OUTPATIENT_CLINIC_OR_DEPARTMENT_OTHER): Payer: 59 | Admitting: Hematology

## 2021-06-21 VITALS — BP 138/81 | HR 85 | Temp 96.7°F | Resp 17 | Wt 121.6 lb

## 2021-06-21 DIAGNOSIS — Z23 Encounter for immunization: Secondary | ICD-10-CM | POA: Diagnosis not present

## 2021-06-21 DIAGNOSIS — D649 Anemia, unspecified: Secondary | ICD-10-CM

## 2021-06-21 DIAGNOSIS — C9000 Multiple myeloma not having achieved remission: Secondary | ICD-10-CM | POA: Diagnosis not present

## 2021-06-21 DIAGNOSIS — D472 Monoclonal gammopathy: Secondary | ICD-10-CM

## 2021-06-21 MED ORDER — INFLUENZA VAC SPLIT QUAD 0.5 ML IM SUSY
0.5000 mL | PREFILLED_SYRINGE | Freq: Once | INTRAMUSCULAR | Status: AC
  Administered 2021-06-21: 0.5 mL via INTRAMUSCULAR
  Filled 2021-06-21: qty 0.5

## 2021-06-21 NOTE — Patient Instructions (Addendum)
[place] at [facility] Discharge Instructions  You were seen today by Dr. Delton Coombes. He went over your recent results. You will be scheduled for a bone marrow biopsy prior to your next visit. Dr. Delton Coombes will see you back in 2 weeks for labs and follow up.   Thank you for choosing [place] at [facility] to provide your oncology and hematology care.  To afford each patient quality time with our provider, please arrive at least 15 minutes before your scheduled appointment time.   If you have a lab appointment with the [facility] please come in thru the Main Entrance and check in at the main information desk  You need to re-schedule your appointment should you arrive 10 or more minutes late.  We strive to give you quality time with our providers, and arriving late affects you and other patients whose appointments are after yours.  Also, if you no show three or more times for appointments you may be dismissed from the clinic at the providers discretion.     Again, thank you for choosing [facility].  Our hope is that these requests will decrease the amount of time that you wait before being seen by our physicians.       _____________________________________________________________  Should you have questions after your visit to [facility], please contact our office at [phone] between the hours of 8:00 a.m. and 4:30 p.m.  Voicemails left after 4:00 p.m. will not be returned until the following business day.  For prescription refill requests, have your pharmacy contact our office and allow 72 hours.    Cancer Center Support Programs:   > Cancer Support Group  2nd Tuesday of the month 1pm-2pm, Journey Room

## 2021-06-21 NOTE — Progress Notes (Signed)
Erica Beasley presents today for injection per the provider's orders.  Fluarix immunization administration without incident; injection site WNL; see MAR for injection details.  Patient tolerated procedure well and without incident. Patient remained stable throughout visit. No questions or complaints noted at this time. Patient discharged ambulatory and in stable condition.

## 2021-06-22 ENCOUNTER — Telehealth: Payer: Self-pay | Admitting: Physical Medicine and Rehabilitation

## 2021-06-22 ENCOUNTER — Encounter: Payer: Self-pay | Admitting: Internal Medicine

## 2021-06-22 ENCOUNTER — Ambulatory Visit (INDEPENDENT_AMBULATORY_CARE_PROVIDER_SITE_OTHER): Payer: 59 | Admitting: Internal Medicine

## 2021-06-22 ENCOUNTER — Other Ambulatory Visit: Payer: Self-pay

## 2021-06-22 ENCOUNTER — Encounter: Payer: Self-pay | Admitting: Physical Medicine and Rehabilitation

## 2021-06-22 VITALS — BP 132/86 | HR 94 | Temp 97.8°F | Ht 64.0 in | Wt 120.4 lb

## 2021-06-22 DIAGNOSIS — K219 Gastro-esophageal reflux disease without esophagitis: Secondary | ICD-10-CM

## 2021-06-22 DIAGNOSIS — R112 Nausea with vomiting, unspecified: Secondary | ICD-10-CM | POA: Diagnosis not present

## 2021-06-22 DIAGNOSIS — R634 Abnormal weight loss: Secondary | ICD-10-CM | POA: Diagnosis not present

## 2021-06-22 NOTE — Patient Instructions (Signed)
It was good to see you again today!  May stop Creon.  May use Imodium as needed for occasional loose stools  Continue Protonix 40 mg daily for GERD  Office visit with [place] in 1 year and as needed.

## 2021-06-22 NOTE — Telephone Encounter (Signed)
-----   Message from Magnus Sinning, MD sent at 06/21/2021  8:08 AM EDT ----- Regarding: mri review Can do MRI review and L4 tf esi if needed

## 2021-06-22 NOTE — Progress Notes (Signed)
Primary Care Physician:  Loman Brooklyn, FNP Primary Gastroenterologist:  Dr. Gala Romney  Pre-Procedure History & Physical: HPI:  Erica Beasley is a 65 y.o. female here for follow-up of a protracted period of nausea,  vomiting, diarrhea over the past 2 years in the wake of COVID and C. difficile infection. Recently treated with PPI,  Creon and probiotics.  She has tapering off on the Creon-cannot tell any difference.  She stopped taking probiotic.  She has 1-4 semiformed stools in the morning and then no other bowel movements the rest of the day.  Her appetite is picked up.  She is gained 17 pounds since her last office visit.  Colonoscopy negative; would be due for 1 more screening exam in 9 years from now.  Past Medical History:  Diagnosis Date   Aortic atherosclerosis ([place]) 11/13/2020   CKD (chronic kidney disease) stage 2, GFR 60-89 ml/min    COPD (chronic obstructive pulmonary disease) ([place])    COVID-19 06/2019   Depression    Essential hypertension    GERD (gastroesophageal reflux disease)    History of diabetes mellitus    Hyperlipidemia    Hypothyroidism    Neuropathy    Rheumatoid arthritis (HCC)    Vitamin B12 deficiency    Vitamin D deficiency     Past Surgical History:  Procedure Laterality Date   BIOPSY  09/16/2019   Procedure: BIOPSY;  Surgeon: Daneil Dolin, MD;  [facility];  Service: Endoscopy;;   BIOPSY  04/28/2020   Procedure: BIOPSY;  Surgeon: Daneil Dolin, MD;  [facility];  Service: Endoscopy;;   BIOPSY  06/08/2020   Procedure: BIOPSY;  Surgeon: Daneil Dolin, MD;  [facility];  Service: Endoscopy;;   CATARACT EXTRACTION Bilateral    COLONOSCOPY  09/2010   Dr. Posey Pronto: mild diverticulsis in sigmoid colon.    COLONOSCOPY WITH PROPOFOL N/A 09/16/2019   Thadius Smisek: Diverticulosis, random colon biopsies negative for microscopic colitis.   COLONOSCOPY WITH PROPOFOL N/A 06/08/2020   Procedure: COLONOSCOPY WITH PROPOFOL;   Surgeon: Daneil Dolin, MD;  [facility];  Service: Endoscopy;  Laterality: N/A;  9:15am   ESOPHAGOGASTRODUODENOSCOPY (EGD) WITH PROPOFOL N/A 04/28/2020   Procedure: ESOPHAGOGASTRODUODENOSCOPY (EGD) WITH PROPOFOL;  Surgeon: Daneil Dolin, MD;  [facility];  Service: Endoscopy;  Laterality: N/A;  2:95   YAG LASER APPLICATION Left 28/41/3244   Procedure: YAG LASER APPLICATION;  Surgeon: Williams Che, MD;  Location: [facility];  Service: Ophthalmology;  Laterality: Left;    Prior to Admission medications   Medication Sig Start Date End Date Taking? Authorizing Provider  aspirin 81 MG EC tablet Take 1 tablet (81 mg total) by mouth daily. Swallow whole. 02/16/21  Yes Loman Brooklyn, FNP  baclofen (LIORESAL) 10 MG tablet Take 0.5-1 tablets (5-10 mg total) by mouth 3 (three) times daily as needed for muscle spasms. 05/26/21  Yes Magnus Sinning, MD  Cholecalciferol (VITAMIN D3) 125 MCG (5000 UT) TABS Take 1 tablet (5,000 Units total) by mouth daily. 05/07/20  Yes Ivy Lynn, NP  citalopram (CELEXA) 40 MG tablet Take 1 tablet (40 mg total) by mouth daily. 11/13/20  Yes Hendricks Limes F, FNP  gabapentin (NEURONTIN) 300 MG capsule TAKE (1) CAPSULE THREE TIMES DAILY. 06/02/21  Yes Hendricks Limes F, FNP  HYDROcodone-acetaminophen (NORCO/VICODIN) 5-325 MG tablet Take 1 tablet by mouth every 6 (six) hours as needed for moderate pain. 05/26/21  Yes Magnus Sinning,  MD  hydroxychloroquine (PLAQUENIL) 200 MG tablet Take 200 mg by mouth daily.    Yes [provider]  leflunomide (ARAVA) 20 MG tablet Take 20 mg by mouth daily.   Yes [provider]  levothyroxine (SYNTHROID) 100 MCG tablet Take 1 tablet (100 mcg total) by mouth every morning. 02/17/21  Yes Hendricks Limes F, FNP  lisinopril (ZESTRIL) 10 MG tablet Take 1 tablet (10 mg total) by mouth daily. Patient taking differently: Take 5 mg by mouth daily. 11/13/20  Yes Hendricks Limes F, FNP  loperamide (IMODIUM A-D) 2  MG tablet Take 1 tablet (2 mg total) by mouth in the morning. Take second dose if needed Patient taking differently: Take 2 mg by mouth as needed. Take second dose if needed 09/14/20  Yes Dettinger, Fransisca Kaufmann, MD  Pancrelipase, Lip-Prot-Amyl, (CREON) 24000-76000 units CPEP Take 1-2 capsules (24,000-48,000 Units total) by mouth See admin instructions. Take 48,000 units (2 capsules) with meals and 24000 units (1 capsule) with snacks 02/16/21  Yes Annitta Needs, NP  pantoprazole (PROTONIX) 40 MG tablet TAKE ONE TABLET BY MOUTH DAILY BEFORE BREAKFAST 11/13/20  Yes Hendricks Limes F, FNP  potassium chloride SA (KLOR-CON) 20 MEQ tablet Take 1 tablet (20 mEq total) by mouth daily. (NEEDS TO BE SEEN BEFORE NEXT REFILL) 06/14/21  Yes Hendricks Limes F, FNP  pravastatin (PRAVACHOL) 40 MG tablet TAKE ONE TABLET AT BEDTIME 05/17/21  Yes Hendricks Limes F, FNP  predniSONE (DELTASONE) 5 MG tablet Take 5 mg by mouth daily. 04/06/21  Yes [provider]  raloxifene (EVISTA) 60 MG tablet 1 tablet   Yes [provider]  sulfaDIAZINE 500 MG tablet Take 500 mg by mouth 2 (two) times daily.   Yes [provider]  sulfaSALAzine (AZULFIDINE) 500 MG tablet 1 tablet 03/17/21  Yes [provider]  traZODone (DESYREL) 100 MG tablet TAKE 1 TABLET AT BEDTIME AS NEEDED FOR SLEEP 05/20/21  Yes Loman Brooklyn, FNP    Allergies as of 06/22/2021 - Review Complete 06/22/2021  Allergen Reaction Noted   Adalimumab  12/08/2020    Family History  Problem Relation Age of Onset   Stroke Mother    Heart disease Mother    Diabetes Mother    Breast cancer Mother    Other Father        stomach taken out for some reason   Migraines Daughter    Colon cancer Neg Hx     Social History   Socioeconomic History   Marital status: Widowed    Spouse name: Not on file   Number of children: 1   Years of education: Not on file   Highest education level: Not on file  Occupational History   Occupation:  DISABLED  Tobacco Use   Smoking status: Former    Packs/day: 1.00    Years: 15.00    Pack years: 15.00    Types: Cigarettes    Quit date: 09/11/2017    Years since quitting: 3.7   Smokeless tobacco: Former  Scientific laboratory technician Use: Never used  Substance and Sexual Activity   Alcohol use: Never   Drug use: Never   Sexual activity: Not Currently  Other Topics Concern   Not on file  Social History Narrative   Not on file   Social Determinants of Health   Financial Resource Strain: Low Risk    Difficulty of Paying Living Expenses: Not hard at all  Food Insecurity: No Food Insecurity   Worried About Running  Out of Food in the Last Year: Never true   Ran Out of Food in the Last Year: Never true  Transportation Needs: No Transportation Needs   Lack of Transportation (Medical): No   Lack of Transportation (Non-Medical): No  Physical Activity: Insufficiently Active   Days of Exercise per Week: 3 days   Minutes of Exercise per Session: 30 min  Stress: No Stress Concern Present   Feeling of Stress : Only a little  Social Connections: Socially Isolated   Frequency of Communication with Friends and Family: More than three times a week   Frequency of Social Gatherings with Friends and Family: More than three times a week   Attends Religious Services: Never   Marine scientist or Organizations: No   Attends Archivist Meetings: Never   Marital Status: Widowed  Human resources officer Violence: Not At Risk   Fear of Current or Ex-Partner: No   Emotionally Abused: No   Physically Abused: No   Sexually Abused: No    Review of Systems: See HPI, otherwise negative ROS  Physical Exam: BP 132/86   Pulse 94   Temp 97.8 F (36.6 C)   Ht 5\' 4"  (1.626 m)   Wt 120 lb 6.4 oz (54.6 kg)   BMI 20.67 kg/m  General:   Alert,  Well-developed, well-nourished, pleasant and cooperative in NAD Neck:  Supple; no masses or thyromegaly. No significant cervical adenopathy. Lungs:  Clear  throughout to auscultation.   No wheezes, crackles, or rhonchi. No acute distress. Heart:  Regular rate and rhythm; no murmurs, clicks, rubs,  or gallops. Abdomen: Non-distended, normal bowel sounds.  Soft and nontender without appreciable mass or hepatosplenomegaly.  Pulses:  Normal pulses noted. Extremities:  Without clubbing or edema.  Impression/Plan: Very pleasant 65 year old lady with a history of COVID and C. difficile over the past 2 years with associated GI people characterized by diarrhea, nausea and vomiting.  Associated weight loss.  Clinically, finally much improved. 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  GERD well-controlled on 40 mg of Protonix daily.  I doubt chronic pancreatic exocrine insufficiency.  Creon not helping much.   Recommendations:  May stop Creon.  May use Imodium as needed for occasional loose stools  Continue Protonix 40 mg daily for GERD  Office visit with [place] in 1 year and as needed.   Notice: This dictation was prepared with Dragon dictation along with smaller phrase technology. Any transcriptional errors that result from this process are unintentional and may not be corrected upon review.

## 2021-06-22 NOTE — Telephone Encounter (Signed)
Scheduled for MRI review only at this time due to insurance.

## 2021-06-23 ENCOUNTER — Ambulatory Visit (INDEPENDENT_AMBULATORY_CARE_PROVIDER_SITE_OTHER): Payer: 59 | Admitting: Physical Medicine and Rehabilitation

## 2021-06-23 ENCOUNTER — Encounter: Payer: Self-pay | Admitting: Physical Medicine and Rehabilitation

## 2021-06-23 VITALS — BP 124/77 | HR 97

## 2021-06-23 DIAGNOSIS — M48061 Spinal stenosis, lumbar region without neurogenic claudication: Secondary | ICD-10-CM | POA: Diagnosis not present

## 2021-06-23 DIAGNOSIS — M5116 Intervertebral disc disorders with radiculopathy, lumbar region: Secondary | ICD-10-CM

## 2021-06-23 DIAGNOSIS — M47819 Spondylosis without myelopathy or radiculopathy, site unspecified: Secondary | ICD-10-CM | POA: Diagnosis not present

## 2021-06-23 DIAGNOSIS — M5416 Radiculopathy, lumbar region: Secondary | ICD-10-CM | POA: Diagnosis not present

## 2021-06-23 NOTE — Progress Notes (Signed)
Erica Beasley - 66 y.o. female MRN [id number]  Date of birth: November 12, 1955  Office Visit Note: Visit Date: 06/23/2021 PCP: Loman Brooklyn, FNP Referred by: Loman Brooklyn, FNP  Subjective: Chief Complaint  Patient presents with   Lower Back - Pain   Right Leg - Pain   HPI: Erica Beasley is a 65 y.o. female who comes in today for evaluation of chronic, worsening and severe right sided lower back pain radiating to buttock, hip and down to lateral thigh/knee. Patient states pain has been chronic for years. Patient reports pain is exacerbated with walking and activity, describes as soreness sensation and currently rates as 8 out of 10. Patient reports some relief pain pain with Ibuprofen and Tylenol. Patient reports she attended sessions with chiropractor Dr. Dwana Curd, which did not help to alleviate pain. Patient also reports she recently began physical therapy at [facility], which she states has not given her any relief of pain. Patient recent lumbar MRI exhibits right disc bulge with facet hypertrophy at L4-5 with severe right and moderate left L4 foraminal stenosis, no high grade canal stenosis noted. Patient had right L5-S1 interlaminar epidural steroid injection in August, which she reports helped greater than 80%, but was short lived. Patient states she is having trouble performing tasks at home, sleeping and getting in and out of bathtub due to severe pain. Patient is currently being treated for monoclonal gammopathy and normocytic anemia by Dr. Derek Jack at [facility]. She is scheduled for CT bone marrow biopsy in October.   Patient denies focal weakness, numbness and tingling. Patient denies recent trauma or falls.    Prior injection did offer more than 50% relief as noted above, injection did give her more functional ability for activities of daily living and was also beneficial in that it did reduce her medication  requirement. She had physical therapy/chiropractor sessions and continues with directed home exercise program. Current medication management is not beneficial in increasing her functional status. Please note that procedures are done as part of a comprehensive orthopedic and pain management program with access to in-house orthopedics, spine surgery and physical therapy as well as access to [place] biopsychosocial counseling if needed.    Review of Systems  Musculoskeletal:  Positive for back pain.  Neurological:  Negative for tremors, sensory change, focal weakness and weakness.  All other systems reviewed and are negative. Otherwise per HPI.  Assessment & Plan: Visit Diagnoses:    ICD-10-CM   1. Lumbar radiculopathy  M54.16 Ambulatory referral to Physical Medicine Rehab    2. Foraminal stenosis of lumbar region  M48.061     3. Facet arthropathy  M47.819     4. Intervertebral disc disorders with radiculopathy, lumbar region  M51.16        Plan: Findings:  Chronic, worsening and severe right sided lower back pain radiating to buttock, hip and down to lateral thigh/knee. No left sided symptoms noted today. Patient's recent lumbar MRI reviewed with her and daughter in detail today using images and spine model. Patient's clinical presentation and exam are consistent with classic L5 nerve pattern. Patient continues to have excruciating pain despite good conservative therapies such as chiropractor sessions, formal physical therapy, home exercise program and medications. We believe the next step is to perform right L5 transforaminal epidural steroid injection under fluoroscopic guidance. If patient's radicular symptoms improve after this epidural injection but she continues to have lower back pain, we  would consider facet joint injections and possible radiofrequency ablation. Patient encouraged to continue regimen of physical therapy and to take medications as directed. No red flag  symptoms noted.    Meds & Orders: No orders of the defined types were placed in this encounter.   Orders Placed This Encounter  Procedures   Ambulatory referral to Physical Medicine Rehab    Follow-up: Return in about 1 week (around 06/30/2021) for Right L5 transforaminal epidural steroid injection.   Procedures: No procedures performed      Clinical History: EXAM: MRI LUMBAR SPINE WITHOUT CONTRAST   TECHNIQUE: Multiplanar, multisequence MR imaging of the lumbar spine was performed. No intravenous contrast was administered.   COMPARISON:  Radiograph from 04/10/2021.   FINDINGS: Segmentation: Standard. Lowest well-formed disc space labeled the L5-S1 level.   Alignment: Moderate dextroscoliosis with apex at L3. Trace retrolisthesis of T11 on T12 and L1 on L2.   Vertebrae: Vertebral body height maintained without acute or chronic fracture. Bone marrow signal intensity within normal limits. Benign hemangioma noted within the right pedicle of L1. No other discrete or worrisome osseous lesions. Prominent discogenic reactive endplate change present about the L3-4 and L4-5 interspaces related to underlying scoliotic curvature. Mild reactive marrow edema also noted about the left L4-5 facet due to facet arthritis.   Conus medullaris and cauda equina: Conus extends to the T12-L1 level. Conus and cauda equina appear normal.   Paraspinal and other soft tissues: Unremarkable.   Disc levels:   T11-12: Seen only on sagittal projection. Disc bulge with disc desiccation with mild reactive endplate change. Mild facet hypertrophy. No significant stenosis.   T12-L1: Unremarkable.   L1-2: Mild disc bulge with disc desiccation and reactive endplate spurring. Disc bulging asymmetric to the right. Superimposed small central disc protrusion minimally indents the ventral thecal sac. Mild facet hypertrophy. No spinal stenosis. Mild right L1 foraminal narrowing. Left neural foramina  remains patent.   L2-3: Degenerative intervertebral disc space narrowing with diffuse disc bulge and disc desiccation. Disc bulging eccentric to the left with associated left foraminal to extraforaminal disc protrusion (series 8, image 18). Mild facet and ligament flavum hypertrophy. Mild narrowing of the left lateral recess. Central canal remains patent. Mild to moderate left L2 foraminal narrowing. Right neural foramina remains patent.   L3-4: Degenerative intervertebral disc space narrowing with disc desiccation and diffuse disc bulge, asymmetric to the left. Prominent reactive endplate spurring with marrow edema, also greater on the left. Resultant large extraforaminal disc osteophyte complex (series 7, image 23). Moderate left worse than right facet hypertrophy. Resultant mild narrowing of the left lateral recess. Central canal remains patent. Moderate left L3 foraminal stenosis. Right neural foramina remains patent.   L4-5: Degenerative intervertebral disc space narrowing with disc desiccation and diffuse disc bulge. Associated discogenic reactive endplate change with marginal endplate osteophytic spurring. Disc bulging eccentric to the right. Superimposed mild to moderate facet hypertrophy. Resultant mild to moderate right lateral recess narrowing. Central canal remains patent. Severe right with moderate left L4 foraminal stenosis (series 5, image 7).   L5-S1:  Normal interspace.  Mild facet hypertrophy.  No stenosis.   IMPRESSION: 1. 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 Query right L4 radiculitis. 2. Left eccentric disc bulge and reactive endplate changes at E5-6 with resultant moderate left L3 foraminal stenosis. 3. Left eccentric disc bulge with foraminal disc protrusion at L2-3 with resultant mild to moderate left lateral recess and  foraminal stenosis. 4. Underlying moderate  dextroscoliosis.     Electronically Signed   By: Jeannine Boga M.D.   On: 06/21/2021 03:28   She reports that she quit smoking about 3 years ago. Her smoking use included cigarettes. She has a 15.00 pack-year smoking history. She has quit using smokeless tobacco.  Recent Labs    11/13/20 1122 02/16/21 0951  HGBA1C 6.4 6.3    Objective:  VS:  HT:    WT:   BMI:     BP:124/77  HR:97bpm  TEMP: ( )  RESP:  Physical Exam HENT:     Head: Normocephalic and atraumatic.     Right Ear: Tympanic membrane normal.     Left Ear: Tympanic membrane normal.     Nose: Nose normal.     Mouth/Throat:     Mouth: Mucous membranes are moist.  Eyes:     Pupils: Pupils are equal, round, and reactive to light.  Cardiovascular:     Rate and Rhythm: Normal rate.  Pulmonary:     Effort: Pulmonary effort is normal.  Abdominal:     General: Abdomen is flat. There is no distension.  Musculoskeletal:     Cervical back: Normal range of motion.     Comments: Pt is slow to rise from seated position to standing. Good lumbar range of motion. Strong distal strength without clonus, no pain upon palpation of greater trochanters. No pain noted upon flexion/extension and rotation to bilateral hips. Sensation intact bilaterally. Dysesthesias noted to right L5 dermatome. Walks independently, gait is slow.    Skin:    General: Skin is warm and dry.     Capillary Refill: Capillary refill takes less than 2 seconds.  Neurological:     General: No focal deficit present.     Mental Status: She is alert.  Psychiatric:        Mood and Affect: Mood normal.    Ortho Exam  Imaging: No results found.  Past Medical/Family/Surgical/Social History: Medications & Allergies reviewed per EMR, new medications updated. Patient Active Problem List   Diagnosis Date Noted   Hypothyroidism 11/13/2020   Prediabetes 11/13/2020   B12 deficiency 11/13/2020   Chronic obstructive pulmonary disease ([place]) 11/13/2020    Vitamin D deficiency 11/13/2020   Neuropathy 11/13/2020   Rheumatoid arthritis involving multiple sites ([place]) 11/13/2020   Mixed hyperlipidemia 11/13/2020   Aortic atherosclerosis ([place]) 11/13/2020   GERD (gastroesophageal reflux disease) 11/05/2020   Depression, recurrent ([place]) 09/22/2020   Essential hypertension 09/22/2020   Dehydration 08/13/2020   Hypokalemia 08/13/2020   Abnormal CT of the abdomen 06/02/2020   N&V (nausea and vomiting) 04/08/2020   Monoclonal gammopathy 04/03/2020   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 03/26/2020   Hx of colonic polyps 05/03/2019   H/O Clostridium difficile infection 05/03/2019   Normocytic anemia 05/03/2019   Chronic diarrhea 01/29/2019   Past Medical History:  Diagnosis Date   Aortic atherosclerosis ([place]) 11/13/2020   CKD (chronic kidney disease) stage 2, GFR 60-89 ml/min    COPD (chronic obstructive pulmonary disease) ([place])    COVID-19 06/2019   Depression    Essential hypertension    GERD (gastroesophageal reflux disease)    History of diabetes mellitus    Hyperlipidemia    Hypothyroidism    Neuropathy    Rheumatoid arthritis (HCC)    Vitamin B12 deficiency    Vitamin D deficiency    Family History  Problem Relation Age of Onset   Stroke Mother    Heart  disease Mother    Diabetes Mother    Breast cancer Mother    Other Father        stomach taken out for some reason   Migraines Daughter    Colon cancer Neg Hx    Past Surgical History:  Procedure Laterality Date   BIOPSY  09/16/2019   Procedure: BIOPSY;  Surgeon: Daneil Dolin, MD;  [facility];  Service: Endoscopy;;   BIOPSY  04/28/2020   Procedure: BIOPSY;  Surgeon: Daneil Dolin, MD;  [facility];  Service: Endoscopy;;   BIOPSY  06/08/2020   Procedure: BIOPSY;  Surgeon: Daneil Dolin, MD;  [facility];  Service: Endoscopy;;   CATARACT EXTRACTION Bilateral    COLONOSCOPY  09/2010   Dr. Posey Pronto: mild diverticulsis in  sigmoid colon.    COLONOSCOPY WITH PROPOFOL N/A 09/16/2019   Rourk: Diverticulosis, random colon biopsies negative for microscopic colitis.   COLONOSCOPY WITH PROPOFOL N/A 06/08/2020   Procedure: COLONOSCOPY WITH PROPOFOL;  Surgeon: Daneil Dolin, MD;  [facility];  Service: Endoscopy;  Laterality: N/A;  9:15am   ESOPHAGOGASTRODUODENOSCOPY (EGD) WITH PROPOFOL N/A 04/28/2020   Procedure: ESOPHAGOGASTRODUODENOSCOPY (EGD) WITH PROPOFOL;  Surgeon: Daneil Dolin, MD;  [facility];  Service: Endoscopy;  Laterality: N/A;  2:42   YAG LASER APPLICATION Left 35/36/1443   Procedure: YAG LASER APPLICATION;  Surgeon: Williams Che, MD;  Location: [facility];  Service: Ophthalmology;  Laterality: Left;   Social History   Occupational History   Occupation: DISABLED  Tobacco Use   Smoking status: Former    Packs/day: 1.00    Years: 15.00    Pack years: 15.00    Types: Cigarettes    Quit date: 09/11/2017    Years since quitting: 3.7   Smokeless tobacco: Former  Scientific laboratory technician Use: Never used  Substance and Sexual Activity   Alcohol use: Never   Drug use: Never   Sexual activity: Not Currently

## 2021-06-23 NOTE — Progress Notes (Signed)
Pt state lower back pain that travels down her right leg to her shin. Pt state walking and standing makes the pain worse. Pt state she takes over the counter pain meds to help ease her pain. Pt has hx of inj on 05/20/21 pt state it helped with 80 % relief.  Numeric Pain Rating Scale and Functional Assessment Average Pain 9 Pain Right Now 9 My pain is constant and aching Pain is worse with: walking, standing, and some activites Pain improves with: rest and medication   In the last MONTH (on 0-10 scale) has pain interfered with the following?  1. General activity like being  able to carry out your everyday physical activities such as walking, climbing stairs, carrying groceries, or moving a chair?  Rating(7)  2. Relation with others like being able to carry out your usual social activities and roles such as  activities at home, at work and in your community. Rating(8)  3. Enjoyment of life such that you have  been bothered by emotional problems such as feeling anxious, depressed or irritable?  Rating(9)

## 2021-06-27 ENCOUNTER — Encounter (HOSPITAL_COMMUNITY): Payer: Self-pay | Admitting: Hematology

## 2021-06-28 ENCOUNTER — Other Ambulatory Visit (HOSPITAL_COMMUNITY): Payer: Self-pay | Admitting: Family Medicine

## 2021-06-28 DIAGNOSIS — Z1231 Encounter for screening mammogram for malignant neoplasm of breast: Secondary | ICD-10-CM

## 2021-06-29 ENCOUNTER — Other Ambulatory Visit: Payer: Self-pay | Admitting: Family Medicine

## 2021-06-29 DIAGNOSIS — G629 Polyneuropathy, unspecified: Secondary | ICD-10-CM

## 2021-07-08 ENCOUNTER — Other Ambulatory Visit: Payer: Self-pay

## 2021-07-08 ENCOUNTER — Ambulatory Visit: Payer: Self-pay

## 2021-07-08 ENCOUNTER — Encounter: Payer: Self-pay | Admitting: Physical Medicine and Rehabilitation

## 2021-07-08 ENCOUNTER — Ambulatory Visit (INDEPENDENT_AMBULATORY_CARE_PROVIDER_SITE_OTHER): Payer: Medicaid Other | Admitting: Physical Medicine and Rehabilitation

## 2021-07-08 VITALS — BP 148/75 | HR 97

## 2021-07-08 DIAGNOSIS — M5416 Radiculopathy, lumbar region: Secondary | ICD-10-CM

## 2021-07-08 MED ORDER — METHYLPREDNISOLONE ACETATE 80 MG/ML IJ SUSP
80.0000 mg | Freq: Once | INTRAMUSCULAR | Status: AC
  Administered 2021-07-08: 80 mg

## 2021-07-08 NOTE — Patient Instructions (Signed)

## 2021-07-08 NOTE — Progress Notes (Signed)
Pt state lower back pain that travels down her right leg and her left knee. Pt state walking makes the pan worse. Pt state she takes pain meds and uses heating to help ease her pain. Pt has hx of inj on 05/20/21 pt state  Numeric Pain Rating Scale and Functional Assessment Average Pain 5   In the last MONTH (on 0-10 scale) has pain interfered with the following?  1. General activity like being  able to carry out your everyday physical activities such as walking, climbing stairs, carrying groceries, or moving a chair?  Rating(7)   +Driver, -BT, -Dye Allergies.

## 2021-07-09 ENCOUNTER — Encounter: Payer: Medicaid Other | Admitting: Physician Assistant

## 2021-07-09 ENCOUNTER — Encounter: Payer: Self-pay | Admitting: Family Medicine

## 2021-07-09 ENCOUNTER — Ambulatory Visit (INDEPENDENT_AMBULATORY_CARE_PROVIDER_SITE_OTHER): Payer: 59 | Admitting: Family Medicine

## 2021-07-09 DIAGNOSIS — U071 COVID-19: Secondary | ICD-10-CM | POA: Diagnosis not present

## 2021-07-09 MED ORDER — MOLNUPIRAVIR EUA 200MG CAPSULE: 4.0000 | ORAL_CAPSULE | Freq: Two times a day (BID) | ORAL | 0 refills | Status: AC

## 2021-07-09 NOTE — Progress Notes (Signed)
   Virtual Visit  Note Due to COVID-19 pandemic this visit was conducted virtually. This visit type was conducted due to national recommendations for restrictions regarding the COVID-19 Pandemic (e.g. social distancing, sheltering in place) in an effort to limit this patient's exposure and mitigate transmission in our community. All issues noted in this document were discussed and addressed.  A physical exam was not performed with this format.  I connected with Erica Beasley on 07/09/21 at 1025 by telephone and verified that I am speaking with the correct person using two identifiers. Erica Beasley is currently located at home and her daughter is currently with her during the visit. The provider, Gwenlyn Perking, FNP is located in their office at time of visit.  I discussed the limitations, risks, security and privacy concerns of performing an evaluation and management service by telephone and the availability of in person appointments. I also discussed with the patient that there may be a patient responsible charge related to this service. The patient expressed understanding and agreed to proceed.  CC: Covid  History and Present Illness:  HPI Erica Beasley reports testing positive with a home Covid test this morning. She reports symptoms that started yesterday. She reports a fever up to 103, chills, dry cough, and congestion. She has taken tylenol, which did control her fever. She denies chest pain or shortness of breath.     ROS As per HPI.   Observations/Objective: Alert and oriented x 3. Able to speak in full sentences without difficulty.   Assessment and Plan: Erica Beasley was seen today for covid positive.  Diagnoses and all orders for this visit:  COVID-19 Antiviral therapy ordered: history of COPD and HTN. Discussed symptomatic care and return precautions.  Discussed quarantine.  -     molnupiravir EUA (LAGEVRIO) 200 mg CAPS capsule; Take 4 capsules (800 mg total) by mouth 2 (two) times  daily for 5 days.    Follow Up Instructions: Return to office for new or worsening symptoms, or if symptoms persist.     I discussed the assessment and treatment plan with the patient. The patient was provided an opportunity to ask questions and all were answered. The patient agreed with the plan and demonstrated an understanding of the instructions.   The patient was advised to call back or seek an in-person evaluation if the symptoms worsen or if the condition fails to improve as anticipated.  The above assessment and management plan was discussed with the patient. The patient verbalized understanding of and has agreed to the management plan. Patient is aware to call the clinic if symptoms persist or worsen. Patient is aware when to return to the clinic for a follow-up visit. Patient educated on when it is appropriate to go to the emergency department.   Time call ended:  1037  I provided 12 minutes of  non face-to-face time during this encounter.    Gwenlyn Perking, FNP

## 2021-07-09 NOTE — Progress Notes (Signed)
Patient seen by PCP, never logged on to video call. Suspect error in scheduling

## 2021-07-13 ENCOUNTER — Other Ambulatory Visit: Payer: Self-pay | Admitting: Family Medicine

## 2021-07-13 DIAGNOSIS — E876 Hypokalemia: Secondary | ICD-10-CM

## 2021-07-13 NOTE — Telephone Encounter (Signed)
Erica Beasley NTBS 30 days given 06/14/21

## 2021-07-14 ENCOUNTER — Other Ambulatory Visit (HOSPITAL_COMMUNITY): Payer: Medicaid Other

## 2021-07-14 ENCOUNTER — Ambulatory Visit (HOSPITAL_COMMUNITY): Payer: Medicaid Other | Admitting: Hematology

## 2021-07-16 ENCOUNTER — Other Ambulatory Visit (HOSPITAL_COMMUNITY): Payer: Self-pay | Admitting: Physician Assistant

## 2021-07-19 ENCOUNTER — Ambulatory Visit (HOSPITAL_COMMUNITY)
Admission: RE | Admit: 2021-07-19 | Discharge: 2021-07-19 | Disposition: A | Discharge status: Home / Self Care | Payer: 59 | Source: Ambulatory Visit | Attending: Hematology | Admitting: Hematology

## 2021-07-19 ENCOUNTER — Encounter (HOSPITAL_COMMUNITY): Payer: Self-pay

## 2021-07-19 DIAGNOSIS — D649 Anemia, unspecified: Secondary | ICD-10-CM | POA: Insufficient documentation

## 2021-07-19 DIAGNOSIS — Z8639 Personal history of other endocrine, nutritional and metabolic disease: Secondary | ICD-10-CM | POA: Insufficient documentation

## 2021-07-19 DIAGNOSIS — D472 Monoclonal gammopathy: Secondary | ICD-10-CM | POA: Diagnosis not present

## 2021-07-19 LAB — CBC WITH DIFFERENTIAL/PLATELET
Abs Immature Granulocytes: 0.05 10*3/uL (ref 0.00–0.07)
Basophils Absolute: 0 10*3/uL (ref 0.0–0.1)
Basophils Relative: 0 %
Eosinophils Absolute: 0.1 10*3/uL (ref 0.0–0.5)
Eosinophils Relative: 2 %
HCT: 29.9 % — ABNORMAL LOW (ref 36.0–46.0)
Hemoglobin: 8.7 g/dL — ABNORMAL LOW (ref 12.0–15.0)
Immature Granulocytes: 1 %
Lymphocytes Relative: 34 %
Lymphs Abs: 2 10*3/uL (ref 0.7–4.0)
MCH: 27.4 pg (ref 26.0–34.0)
MCHC: 29.1 g/dL — ABNORMAL LOW (ref 30.0–36.0)
MCV: 94 fL (ref 80.0–100.0)
Monocytes Absolute: 0.5 10*3/uL (ref 0.1–1.0)
Monocytes Relative: 9 %
Neutro Abs: 3.1 10*3/uL (ref 1.7–7.7)
Neutrophils Relative %: 54 %
Platelets: 285 10*3/uL (ref 150–400)
RBC: 3.18 MIL/uL — ABNORMAL LOW (ref 3.87–5.11)
RDW: 16.5 % — ABNORMAL HIGH (ref 11.5–15.5)
WBC: 5.8 10*3/uL (ref 4.0–10.5)
nRBC: 0 % (ref 0.0–0.2)

## 2021-07-19 LAB — GLUCOSE, CAPILLARY: Glucose-Capillary: 146 mg/dL — ABNORMAL HIGH (ref 70–99)

## 2021-07-19 IMAGING — CT CT BIOPSY BONE MARROW
1 of 2 series · 15 of 28 positions shown, 19 images · non-contrast
Comparison: none

INDICATION: ANEMIA, MONOCLONAL GAMMOPATHY

[Series 2: i-spiral 5.0 br40 · axial · 0.96mm/px · z∈[-125,-48]mm · 15 of 26 slices shown, 19 images]
[im 2/26  mediastinal]
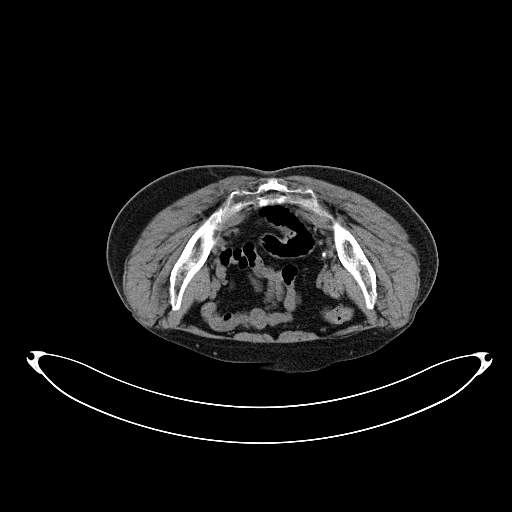
[im 2/26  lung]
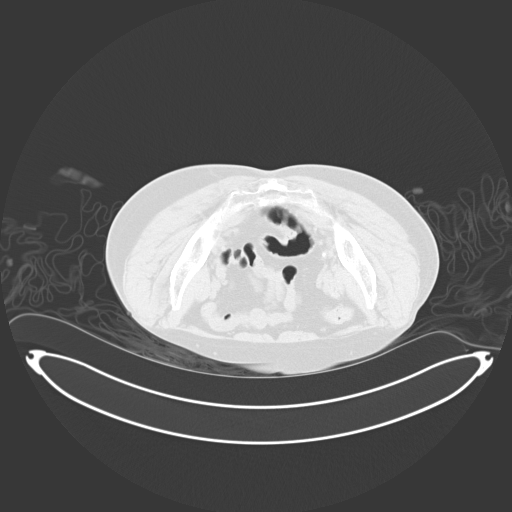
[im 4/26  lung]
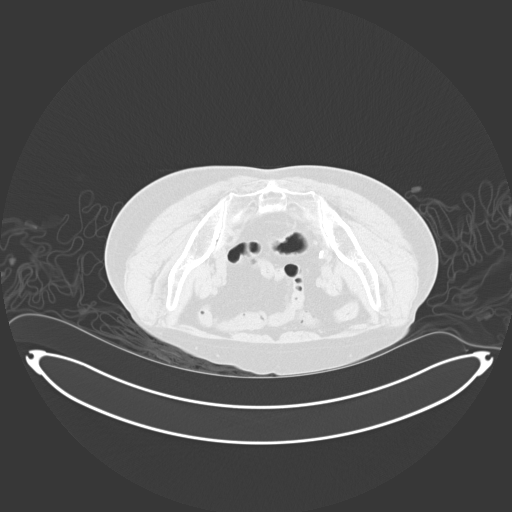
[im 5/26  lung]
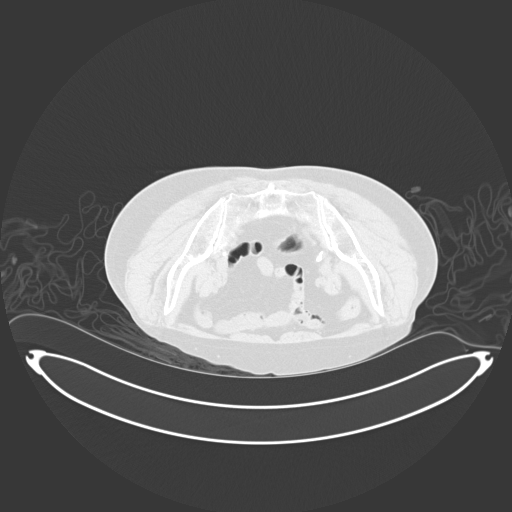
[im 7/26  lung]
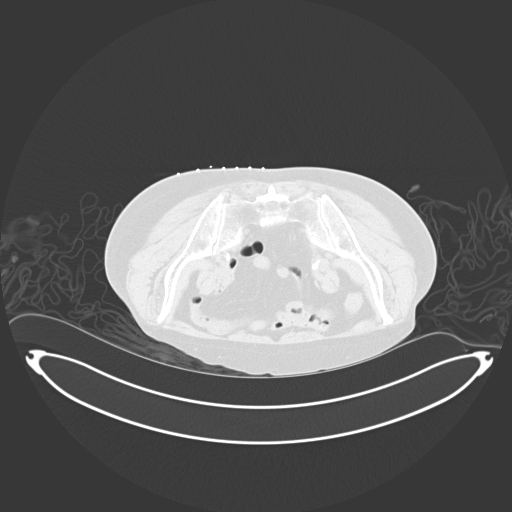
[im 8/26  mediastinal]
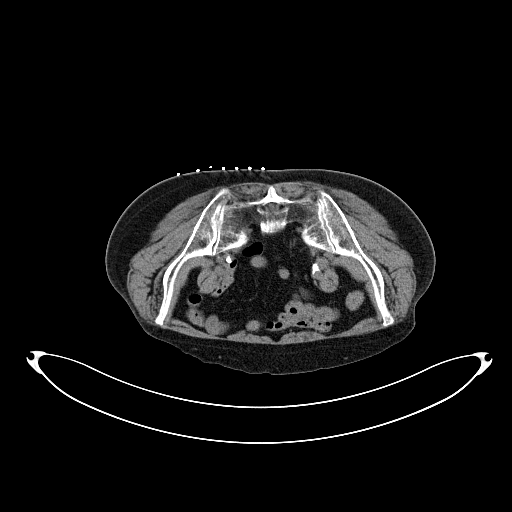
[im 8/26  lung]
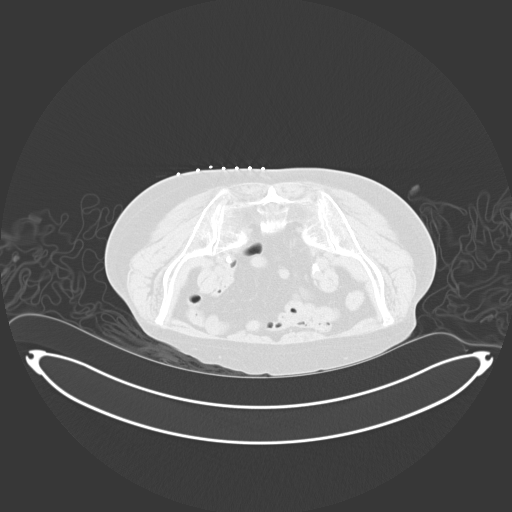
[im 10/26  lung]
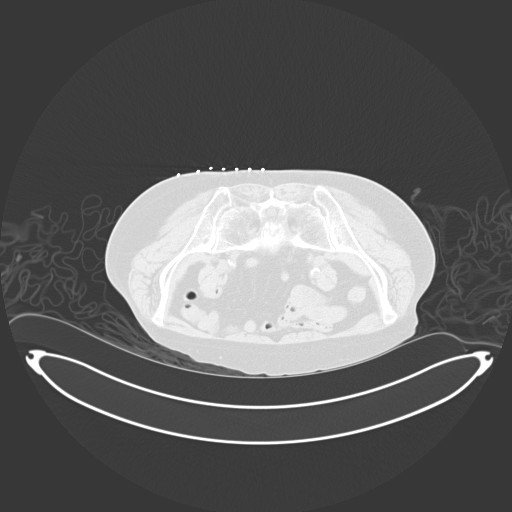
[im 11/26  lung]
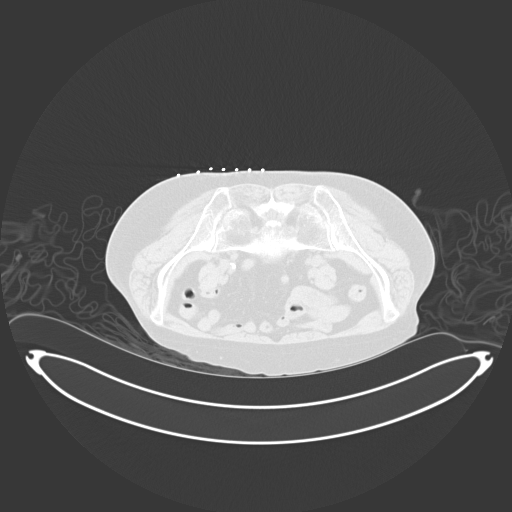
[im 13/26  lung]
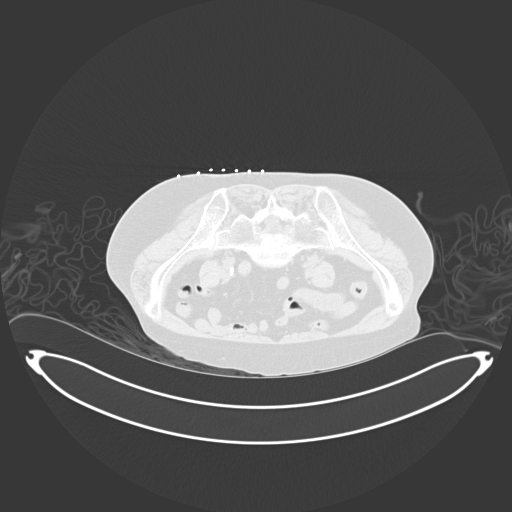
[im 15/26  mediastinal]
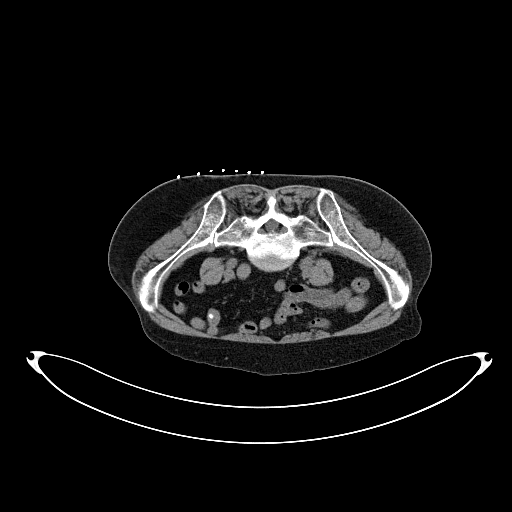
[im 15/26  lung]
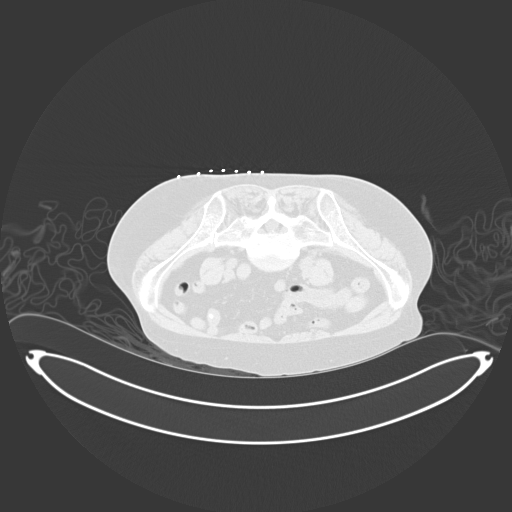
[im 16/26  lung]
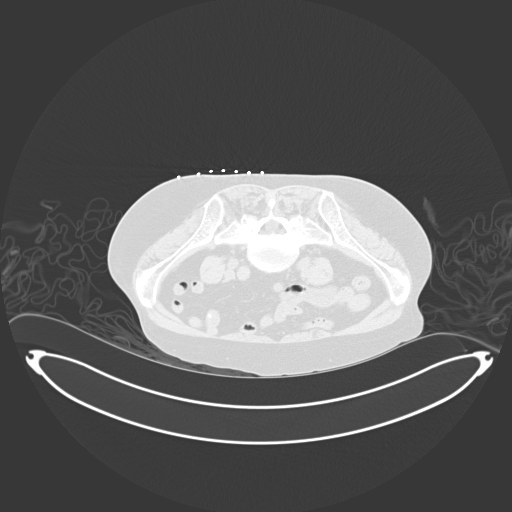
[im 18/26  lung]
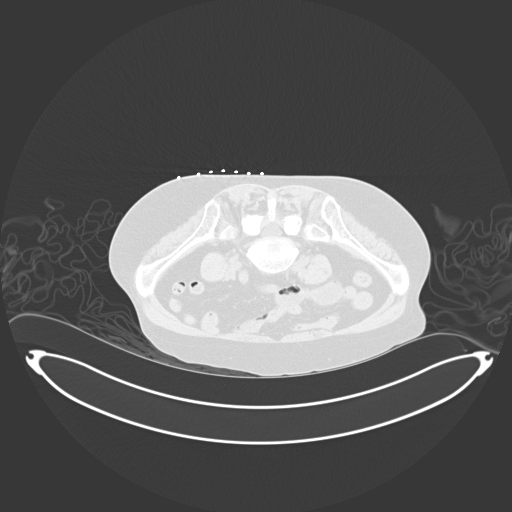
[im 19/26  lung]
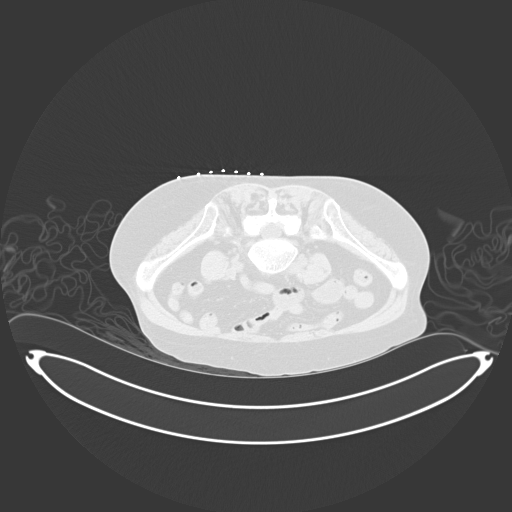
[im 21/26  mediastinal]
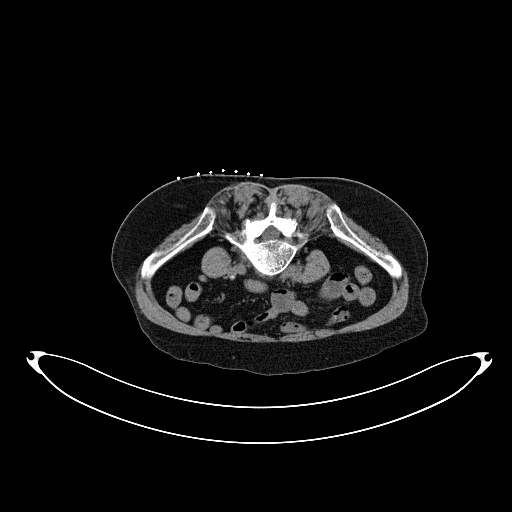
[im 21/26  lung]
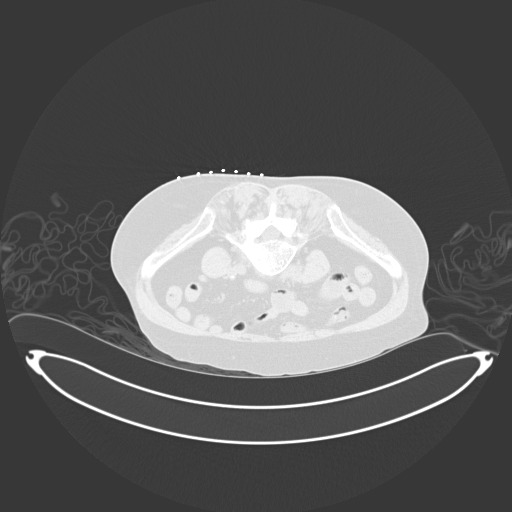
[im 22/26  lung]
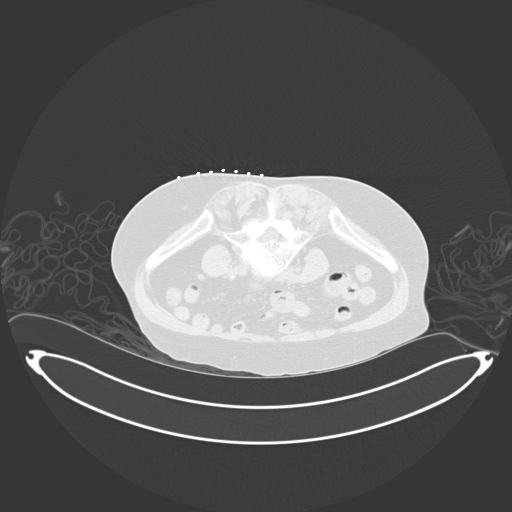
[im 24/26  lung]
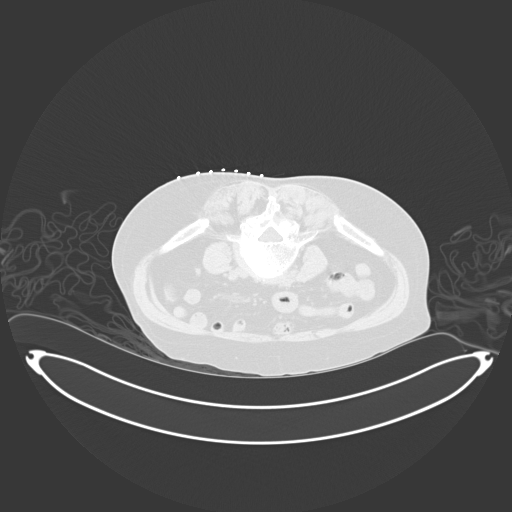

[15 of 28 positions shown; findings below may reference images not displayed]

EXAM:
CT GUIDED RIGHT ILIAC BONE MARROW ASPIRATION AND CORE BIOPSY

Radiologist:  LATORIA

Guidance:  CT

FLUOROSCOPY TIME:  Fluoroscopy Time: None.

MEDICATIONS:
1% lidocaine local

ANESTHESIA/SEDATION:
2.0 mg IV Versed; 100 mcg IV Fentanyl

Moderate Sedation Time:  10 minutes

The patient was continuously monitored during the procedure by the
interventional radiology nurse under my direct supervision.

CONTRAST:  None

COMPLICATIONS:
None

PROCEDURE:
Informed consent was obtained from the patient following explanation
of the procedure, risks, benefits and alternatives. The patient
understands, agrees and consents for the procedure. All questions
were addressed. A time out was performed.

The patient was positioned prone and non-contrast localization CT
was performed of the pelvis to demonstrate the iliac marrow spaces.

Maximal barrier sterile technique utilized including caps, mask,
sterile gowns, sterile gloves, large sterile drape, hand hygiene,
and Betadine prep.

Under sterile conditions and local anesthesia, an 11 gauge coaxial
bone biopsy needle was advanced into the right iliac marrow space.
Needle position was confirmed with CT imaging. Initially, bone
marrow aspiration was performed. Next, the 11 gauge outer cannula
was utilized to obtain a right iliac bone marrow core biopsy. Needle
was removed. Hemostasis was obtained with compression. The patient
tolerated the procedure well. Samples were prepared with the
cytotechnologist. No immediate complications.
IMPRESSION: CT guided right iliac bone marrow aspiration and core biopsy.

## 2021-07-19 IMAGING — CT CT BIOPSY
1 of 2 series · 15 of 28 positions shown, 19 images · non-contrast
Comparison: none

INDICATION: ANEMIA, MONOCLONAL GAMMOPATHY

[Series 2: i-spiral 5.0 br40 · axial · 0.96mm/px · z∈[-125,-48]mm · 15 of 26 slices shown, 19 images]
[im 2/26  mediastinal]
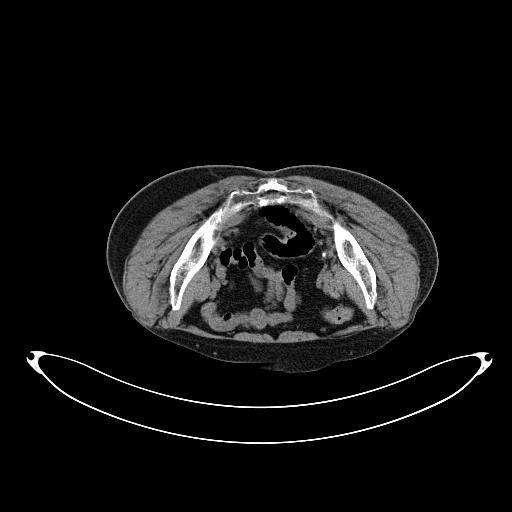
[im 2/26  lung]
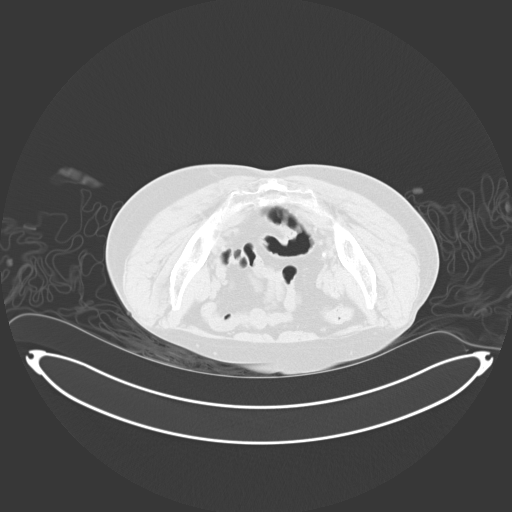
[im 4/26  lung]
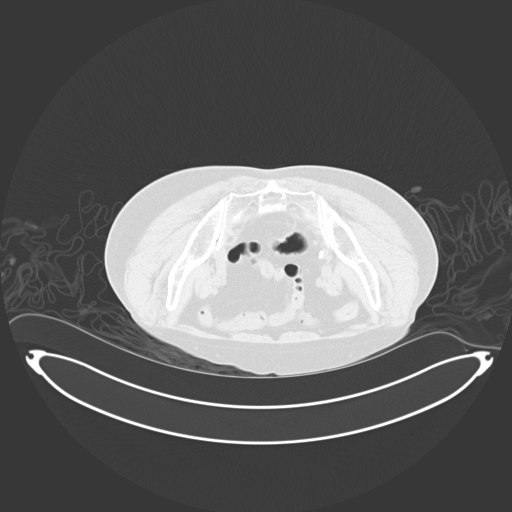
[im 5/26  lung]
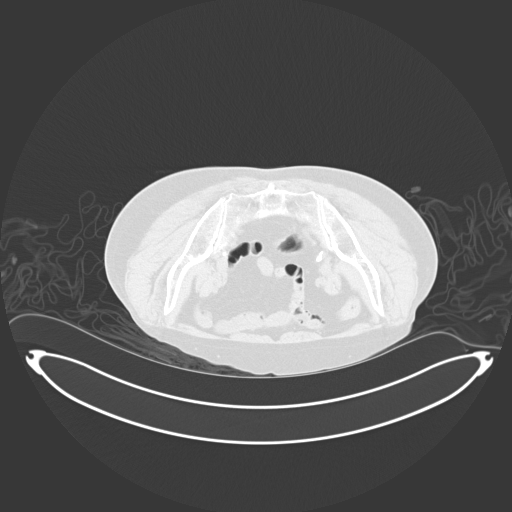
[im 7/26  lung]
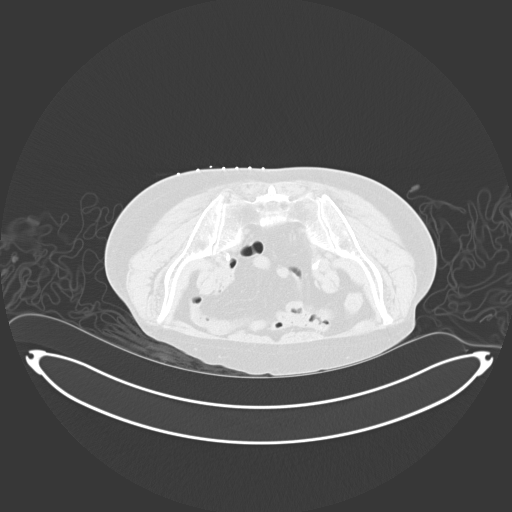
[im 8/26  mediastinal]
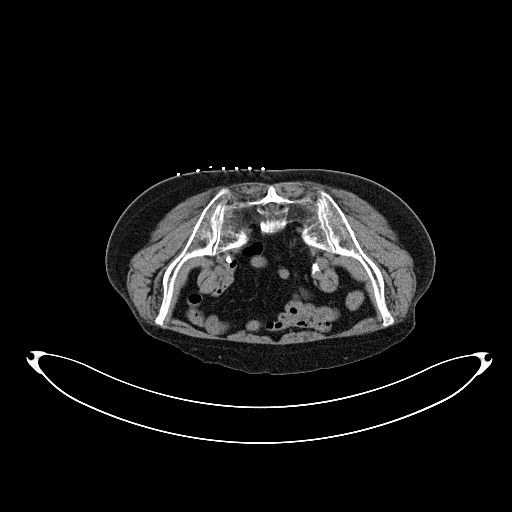
[im 8/26  lung]
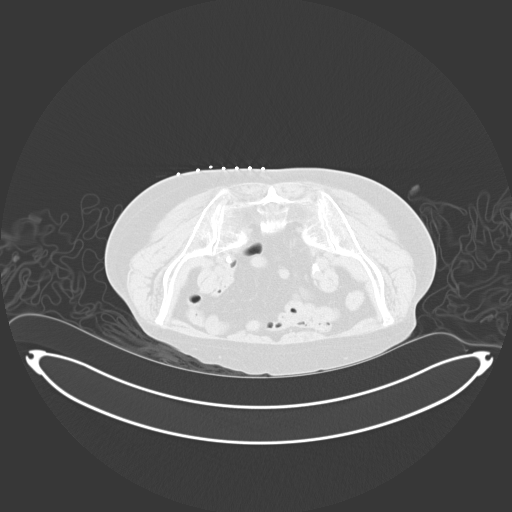
[im 10/26  lung]
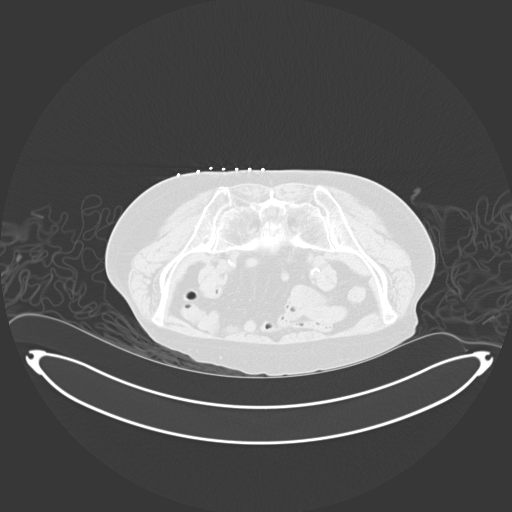
[im 11/26  lung]
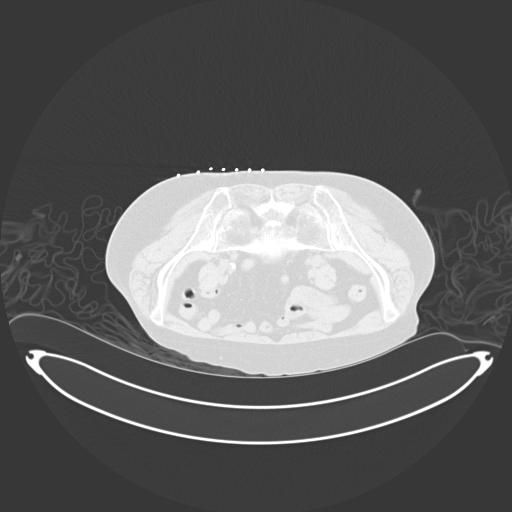
[im 13/26  lung]
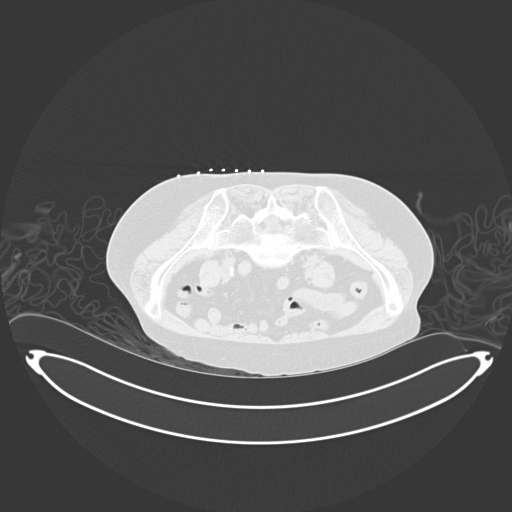
[im 15/26  mediastinal]
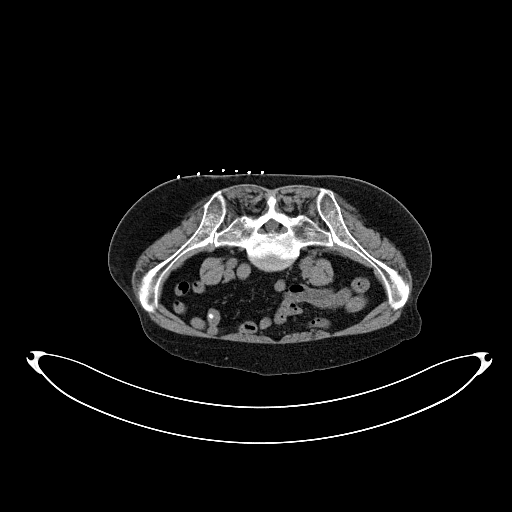
[im 15/26  lung]
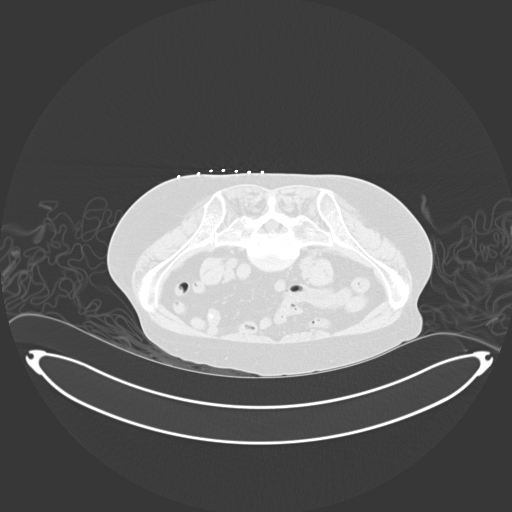
[im 16/26  lung]
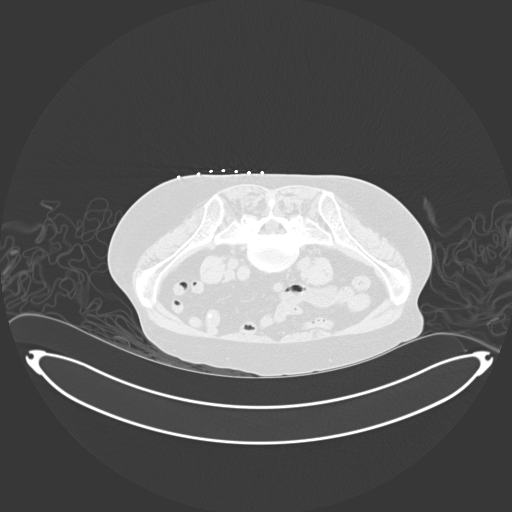
[im 18/26  lung]
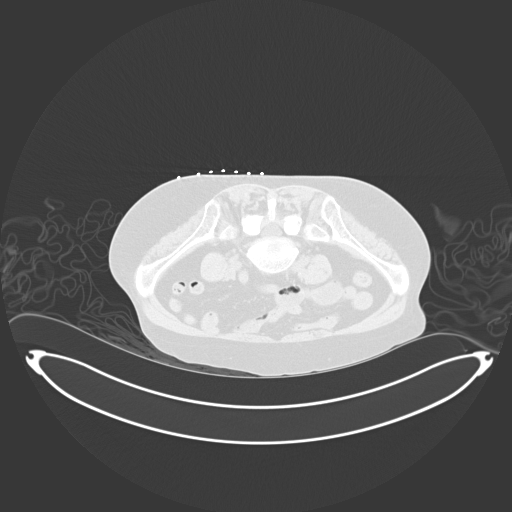
[im 19/26  lung]
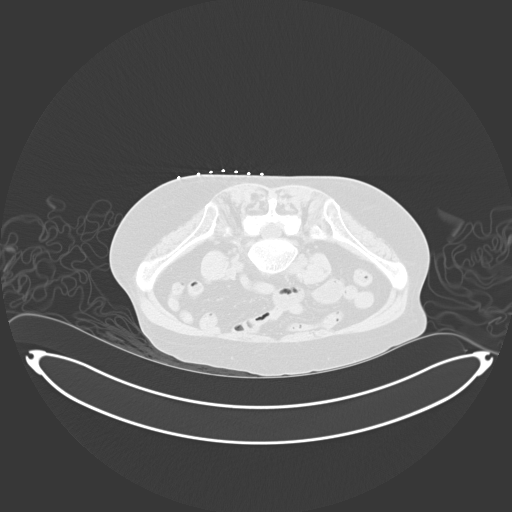
[im 21/26  mediastinal]
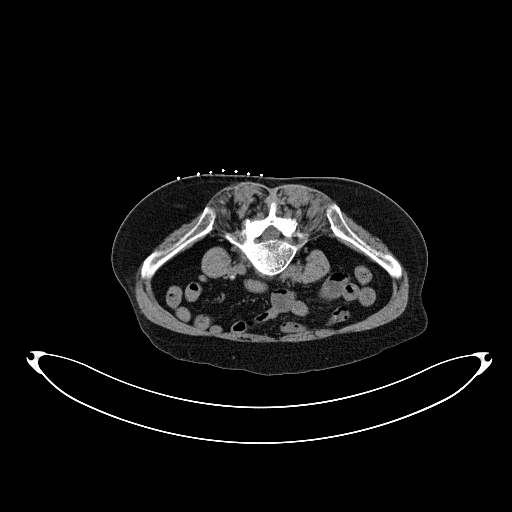
[im 21/26  lung]
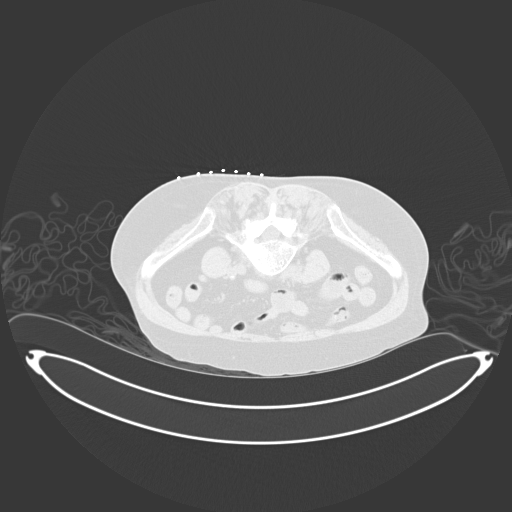
[im 22/26  lung]
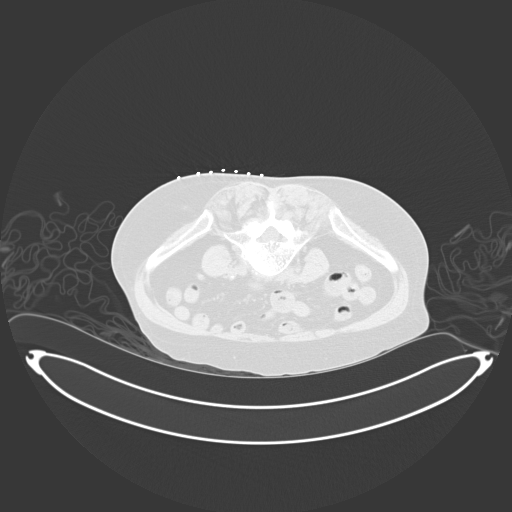
[im 24/26  lung]
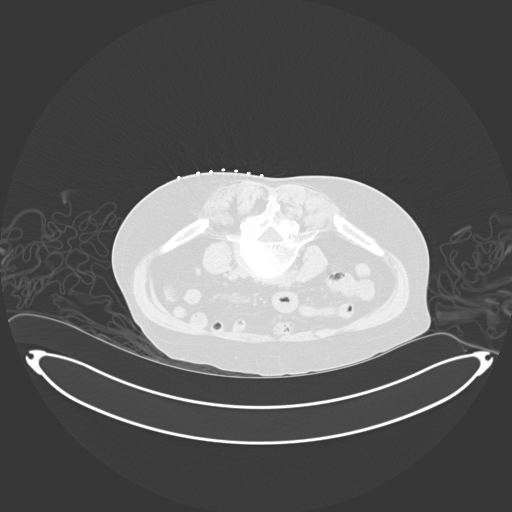

[15 of 28 positions shown; findings below may reference images not displayed]

EXAM:
CT GUIDED RIGHT ILIAC BONE MARROW ASPIRATION AND CORE BIOPSY

Radiologist:  LATORIA

Guidance:  CT

FLUOROSCOPY TIME:  Fluoroscopy Time: None.

MEDICATIONS:
1% lidocaine local

ANESTHESIA/SEDATION:
2.0 mg IV Versed; 100 mcg IV Fentanyl

Moderate Sedation Time:  10 minutes

The patient was continuously monitored during the procedure by the
interventional radiology nurse under my direct supervision.

CONTRAST:  None

COMPLICATIONS:
None

PROCEDURE:
Informed consent was obtained from the patient following explanation
of the procedure, risks, benefits and alternatives. The patient
understands, agrees and consents for the procedure. All questions
were addressed. A time out was performed.

The patient was positioned prone and non-contrast localization CT
was performed of the pelvis to demonstrate the iliac marrow spaces.

Maximal barrier sterile technique utilized including caps, mask,
sterile gowns, sterile gloves, large sterile drape, hand hygiene,
and Betadine prep.

Under sterile conditions and local anesthesia, an 11 gauge coaxial
bone biopsy needle was advanced into the right iliac marrow space.
Needle position was confirmed with CT imaging. Initially, bone
marrow aspiration was performed. Next, the 11 gauge outer cannula
was utilized to obtain a right iliac bone marrow core biopsy. Needle
was removed. Hemostasis was obtained with compression. The patient
tolerated the procedure well. Samples were prepared with the
cytotechnologist. No immediate complications.
IMPRESSION: CT guided right iliac bone marrow aspiration and core biopsy.

## 2021-07-19 MED ORDER — FENTANYL CITRATE (PF) 100 MCG/2ML IJ SOLN
INTRAMUSCULAR | Status: AC | PRN
  Administered 2021-07-19: 50 ug via INTRAVENOUS
  Administered 2021-07-19 (×2): 25 ug via INTRAVENOUS

## 2021-07-19 MED ORDER — SODIUM CHLORIDE 0.9 % IV SOLN: INTRAVENOUS | Status: DC

## 2021-07-19 MED ORDER — LIDOCAINE HCL (PF) 1 % IJ SOLN
INTRAMUSCULAR | Status: AC | PRN
  Administered 2021-07-19: 15 mL

## 2021-07-19 MED ORDER — MIDAZOLAM HCL 2 MG/2ML IJ SOLN
INTRAMUSCULAR | Status: AC
  Filled 2021-07-19: qty 2

## 2021-07-19 MED ORDER — MIDAZOLAM HCL 2 MG/2ML IJ SOLN
INTRAMUSCULAR | Status: AC | PRN
  Administered 2021-07-19: .5 mg via INTRAVENOUS
  Administered 2021-07-19: 1 mg via INTRAVENOUS
  Administered 2021-07-19: .5 mg via INTRAVENOUS

## 2021-07-19 MED ORDER — FENTANYL CITRATE (PF) 100 MCG/2ML IJ SOLN
INTRAMUSCULAR | Status: AC
  Filled 2021-07-19: qty 2

## 2021-07-19 NOTE — Consult Note (Signed)
Chief Complaint: Patient was seen in consultation today for CT-guided bone marrow biopsy  Referring Physician(s): Katragadda,Sreedhar  Supervising Physician: Daryll Brod  Patient Status: [facility] - Out-pt  History of Present Illness: Erica Beasley is a 64 y.o. female with history of Bence-Jones proteinuria, weight loss, rheumatoid arthritis, and macrocytic anemia of uncertain etiology who presents today for CT-guided bone marrow biopsy for further evaluation.  She has also had recent COVID-19.  Additional medical history includes chronic kidney disease, COPD, depression, hypertension, GERD, diabetes, hyperlipidemia, hypothyroidism, vitamin B12 and D deficiencies.  Past Medical History:  Diagnosis Date   Aortic atherosclerosis ([place]) 11/13/2020   CKD (chronic kidney disease) stage 2, GFR 60-89 ml/min    COPD (chronic obstructive pulmonary disease) ([place])    COVID-19 06/2019   Depression    Essential hypertension    GERD (gastroesophageal reflux disease)    History of diabetes mellitus    Hyperlipidemia    Hypothyroidism    Neuropathy    Rheumatoid arthritis (H[facility])    Vitamin B12 deficiency    Vitamin D deficiency     Past Surgical History:  Procedure Laterality Date   BIOPSY  09/16/2019   Procedure: BIOPSY;  Surgeon: Daneil Dolin, MD;  Location: [facility];  Service: Endoscopy;;   BIOPSY  04/28/2020   Procedure: BIOPSY;  Surgeon: Daneil Dolin, MD;  Location: [facility];  Service: Endoscopy;;   BIOPSY  06/08/2020   Procedure: BIOPSY;  Surgeon: Daneil Dolin, MD;  Location: [facility];  Service: Endoscopy;;   CATARACT EXTRACTION Bilateral    COLONOSCOPY  09/2010   Dr. Posey Pronto: mild diverticulsis in sigmoid colon.    COLONOSCOPY WITH PROPOFOL N/A 09/16/2019   Rourk: Diverticulosis, random colon biopsies negative for microscopic colitis.   COLONOSCOPY WITH PROPOFOL N/A 06/08/2020   Procedure: COLONOSCOPY WITH PROPOFOL;  Surgeon: Daneil Dolin, MD;  Location: [facility]  END[facility];  Service: Endoscopy;  Laterality: N/A;  9:15am   ESOPHAGOGASTRODUODENOSCOPY (EGD) WITH PROPOFOL N/A 04/28/2020   Procedure: ESOPHAGOGASTRODUODENOSCOPY (EGD) WITH PROPOFOL;  Surgeon: Daneil Dolin, MD;  Location: [facility];  Service: Endoscopy;  Laterality: N/A;  0:30   YAG LASER APPLICATION Left 06/18/3006   Procedure: YAG LASER APPLICATION;  Surgeon: Williams Che, MD;  Location: [facility] ORS;  Service: Ophthalmology;  Laterality: Left;    Allergies: Adalimumab  Medications: Prior to Admission medications   Medication Sig Start Date End Date Taking? Authorizing Provider  Cholecalciferol (VITAMIN D3) 125 MCG (5000 UT) TABS Take 1 tablet (5,000 Units total) by mouth daily. 05/07/20  Yes Ivy Lynn, NP  citalopram (CELEXA) 40 MG tablet Take 1 tablet (40 mg total) by mouth daily. 11/13/20  Yes Hendricks Limes F, FNP  gabapentin (NEURONTIN) 300 MG capsule TAKE (1) CAPSULE THREE TIMES DAILY. 06/30/21  Yes Hendricks Limes F, FNP  hydroxychloroquine (PLAQUENIL) 200 MG tablet Take 200 mg by mouth daily.    Yes [provider]  leflunomide (ARAVA) 20 MG tablet Take 20 mg by mouth daily.   Yes [provider]  levothyroxine (SYNTHROID) 100 MCG tablet Take 1 tablet (100 mcg total) by mouth every morning. 02/17/21  Yes Hendricks Limes F, FNP  pantoprazole (PROTONIX) 40 MG tablet TAKE ONE TABLET BY MOUTH DAILY BEFORE BREAKFAST 11/13/20  Yes Hendricks Limes F, FNP  potassium chloride SA (KLOR-CON) 20 MEQ tablet Take 1 tablet (20 mEq total) by mouth daily. (NEEDS TO BE SEEN BEFORE NEXT REFILL) 06/14/21  Yes Loman Brooklyn, FNP  pravastatin (PRAVACHOL) 40 MG tablet TAKE ONE TABLET AT BEDTIME 05/17/21  Yes Hendricks Limes F, FNP  predniSONE (DELTASONE) 5 MG tablet Take 5 mg by mouth daily. Pt states she takes with severe arthritic pain 04/06/21  Yes [provider]  sulfaDIAZINE 500 MG tablet Take 500 mg by mouth 2 (two) times daily.   Yes [provider]  traZODone  (DESYREL) 100 MG tablet TAKE 1 TABLET AT BEDTIME AS NEEDED FOR SLEEP 05/20/21  Yes Loman Brooklyn, FNP  aspirin 81 MG EC tablet Take 1 tablet (81 mg total) by mouth daily. Swallow whole. 02/16/21   Loman Brooklyn, FNP  baclofen (LIORESAL) 10 MG tablet Take 0.5-1 tablets (5-10 mg total) by mouth 3 (three) times daily as needed for muscle spasms. 05/26/21   Magnus Sinning, MD  HYDROcodone-acetaminophen (NORCO/VICODIN) 5-325 MG tablet Take 1 tablet by mouth every 6 (six) hours as needed for moderate pain. 05/26/21   Magnus Sinning, MD  lisinopril (ZESTRIL) 10 MG tablet Take 1 tablet (10 mg total) by mouth daily. Patient taking differently: Take 5 mg by mouth daily. 11/13/20   Loman Brooklyn, FNP  loperamide (IMODIUM A-D) 2 MG tablet Take 1 tablet (2 mg total) by mouth in the morning. Take second dose if needed Patient taking differently: Take 2 mg by mouth as needed. Take second dose if needed 09/14/20   Dettinger, Fransisca Kaufmann, MD  Pancrelipase, Lip-Prot-Amyl, (CREON) 24000-76000 units CPEP Take 1-2 capsules (24,000-48,000 Units total) by mouth See admin instructions. Take 48,000 units (2 capsules) with meals and 24000 units (1 capsule) with snacks 02/16/21   Annitta Needs, NP  raloxifene (EVISTA) 60 MG tablet 1 tablet    [provider]  sulfaSALAzine (AZULFIDINE) 500 MG tablet 1 tablet 03/17/21   [provider]     Family History  Problem Relation Age of Onset   Stroke Mother    Heart disease Mother    Diabetes Mother    Breast cancer Mother    Other Father        stomach taken out for some reason   Migraines Daughter    Colon cancer Neg Hx     Social History   Socioeconomic History   Marital status: Widowed    Spouse name: Not on file   Number of children: 1   Years of education: Not on file   Highest education level: Not on file  Occupational History   Occupation: DISABLED  Tobacco Use   Smoking status: Former    Packs/day: 1.00    Years: 15.00    Pack  years: 15.00    Types: Cigarettes    Quit date: 09/11/2017    Years since quitting: 3.8   Smokeless tobacco: Former  Scientific laboratory technician Use: Never used  Substance and Sexual Activity   Alcohol use: Never   Drug use: Never   Sexual activity: Not Currently  Other Topics Concern   Not on file  Social History Narrative   Not on file   Social Determinants of Health   Financial Resource Strain: Low Risk    Difficulty of Paying Living Expenses: Not hard at all  Food Insecurity: No Food Insecurity   Worried About Charity fundraiser in the Last Year: Never true   [place] in the Last Year: Never true  Transportation Needs: No Transportation Needs   Lack of Transportation (Medical): No   Lack of Transportation (Non-Medical): No  Physical Activity: Insufficiently Active  Days of Exercise per Week: 3 days   Minutes of Exercise per Session: 30 min  Stress: No Stress Concern Present   Feeling of Stress : Only a little  Social Connections: Socially Isolated   Frequency of Communication with Friends and Family: More than three times a week   Frequency of Social Gatherings with Friends and Family: More than three times a week   Attends Religious Services: Never   Marine scientist or Organizations: No   Attends Archivist Meetings: Never   Marital Status: Widowed      Review of Systems currently denies fever, headache, chest pain, dyspnea, cough, abdominal/back pain, nausea, vomiting or bleeding. Vital Signs: BP (!) 145/77   Pulse 61   Temp 97.9 F (36.6 C) (Oral)   SpO2 96%   Physical Exam awake, alert.  Chest clear to auscultation bilaterally.  Heart with regular rate and rhythm.  Abdomen soft, positive bowel sounds, nontender.  No lower extremity edema.  Imaging: MR LUMBAR SPINE WO CONTRAST  Result Date: 06/21/2021 CLINICAL DATA:  Initial evaluation for low back pain with radiation into the right lower extremity. EXAM: MRI LUMBAR SPINE WITHOUT  CONTRAST TECHNIQUE: Multiplanar, multisequence MR imaging of the lumbar spine was performed. No intravenous contrast was administered. COMPARISON:  Radiograph from 04/10/2021. FINDINGS: Segmentation: Standard. Lowest well-formed disc space labeled the L5-S1 level. Alignment: Moderate dextroscoliosis with apex at L3. Trace retrolisthesis of T11 on T12 and L1 on L2. Vertebrae: Vertebral body height maintained without acute or chronic fracture. Bone marrow signal intensity within normal limits. Benign hemangioma noted within the right pedicle of L1. No other discrete or worrisome osseous lesions. Prominent discogenic reactive endplate change present about the L3-4 and L4-5 interspaces related to underlying scoliotic curvature. Mild reactive marrow edema also noted about the left L4-5 facet due to facet arthritis. Conus medullaris and cauda equina: Conus extends to the T12-L1 level. Conus and cauda equina appear normal. Paraspinal and other soft tissues: Unremarkable. Disc levels: T11-12: Seen only on sagittal projection. Disc bulge with disc desiccation with mild reactive endplate change. Mild facet hypertrophy. No significant stenosis. T12-L1: Unremarkable. L1-2: Mild disc bulge with disc desiccation and reactive endplate spurring. Disc bulging asymmetric to the right. Superimposed small central disc protrusion minimally indents the ventral thecal sac. Mild facet hypertrophy. No spinal stenosis. Mild right L1 foraminal narrowing. Left neural foramina remains patent. L2-3: Degenerative intervertebral disc space narrowing with diffuse disc bulge and disc desiccation. Disc bulging eccentric to the left with associated left foraminal to extraforaminal disc protrusion (series 8, image 18). Mild facet and ligament flavum hypertrophy. Mild narrowing of the left lateral recess. Central canal remains patent. Mild to moderate left L2 foraminal narrowing. Right neural foramina remains patent. L3-4: Degenerative intervertebral  disc space narrowing with disc desiccation and diffuse disc bulge, asymmetric to the left. Prominent reactive endplate spurring with marrow edema, also greater on the left. Resultant large extraforaminal disc osteophyte complex (series 7, image 23). Moderate left worse than right facet hypertrophy. Resultant mild narrowing of the left lateral recess. Central canal remains patent. Moderate left L3 foraminal stenosis. Right neural foramina remains patent. L4-5: Degenerative intervertebral disc space narrowing with disc desiccation and diffuse disc bulge. Associated discogenic reactive endplate change with marginal endplate osteophytic spurring. Disc bulging eccentric to the right. Superimposed mild to moderate facet hypertrophy. Resultant mild to moderate right lateral recess narrowing. Central canal remains patent. Severe right with moderate left L4 foraminal stenosis (series 5, image 7). L5-S1:  Normal interspace.  Mild facet hypertrophy.  No stenosis. IMPRESSION: 1. 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 Query right L4 radiculitis. 2. Left eccentric disc bulge and reactive endplate changes at A5-5 with resultant moderate left L3 foraminal stenosis. 3. Left eccentric disc bulge with foraminal disc protrusion at L2-3 with resultant mild to moderate left lateral recess and foraminal stenosis. 4. Underlying moderate dextroscoliosis. Electronically Signed   By: Jeannine Boga M.D.   On: 06/21/2021 03:28   XR C-ARM NO REPORT  Result Date: 07/08/2021 Please see Notes tab for imaging impression.   Labs:  CBC: Recent Labs    11/17/20 1343 12/22/20 1328 03/09/21 1331 06/14/21 1040  WBC 6.4 6.8 6.2 6.9  HGB 9.9* 9.7* 9.1* 8.8*  HCT 32.9* 33.2* 30.3* 30.7*  PLT 314 267 371 306    COAGS: No results for input(s): INR, APTT in the last 8760 hours.  BMP: Recent Labs    11/13/20 1129 11/17/20 1343  12/22/20 1328 02/16/21 1002 06/14/21 1040  NA 141 132* 138 139 138  K 4.0 3.9 4.0 4.0 3.5  CL 102 98 103 99 102  CO2 _0 GLUCOSE 149* 111* 190* 169* 178*  BUN _1 CALCIUM 9.3 8.5* 8.6* 9.5 7.6*  CREATININE 0.97 0.92 0.83 1.01* 0.77  GFRNONAA 62 >60 >60  --  >60  GFRAA 72  --   --   --   --     LIVER FUNCTION TESTS: Recent Labs    11/17/20 1343 12/22/20 1328 02/16/21 1002 06/14/21 1040  BILITOT 0.4 0.4 <0.2 0.4  AST 13* 13* 6 9*  ALT _2 ALKPHOS 91 97 136* 96  PROT 7.3 7.1 7.0 6.1*  ALBUMIN 3.4* 3.4* 3.7* 3.1*    TUMOR MARKERS: No results for input(s): AFPTM, CEA, CA199, CHROMGRNA in the last 8760 hours.  Assessment and Plan: 65 y.o. female with history of Bence-Jones proteinuria, weight loss, rheumatoid arthritis, and macrocytic anemia of uncertain etiology who presents today for CT-guided bone marrow biopsy for further evaluation.  She has also had recent COVID-19.  Additional medical history includes chronic kidney disease, COPD, depression, hypertension, GERD, diabetes, hyperlipidemia, hypothyroidism, vitamin B12 and D deficiencies.Risks and benefits of procedure was discussed with the patient including, but not limited to bleeding, infection, damage to adjacent structures or low yield requiring additional tests.  All of the questions were answered and there is agreement to proceed.  Consent signed and in chart.    Thank you for this interesting consult.  I greatly enjoyed meeting Erica Beasley and look forward to participating in their care.  A copy of this report was sent to the requesting provider on this date.  Electronically Signed: D. Rowe Robert, PA-C 07/19/2021, 8:31 AM   I spent a total of   20 minutes  in face to face in clinical consultation, greater than 50% of which was counseling/coordinating care for CT-guided bone marrow biopsy

## 2021-07-19 NOTE — Procedures (Signed)
Interventional Radiology Procedure Note  Procedure: RT ILIAC BM ASP AND CORE BX    Complications: None  Estimated Blood Loss:  MIN  Findings: 11 G CORE AND ASP     M. Daryll Brod, MD

## 2021-07-22 NOTE — Procedures (Signed)
Lumbosacral Transforaminal Epidural Steroid Injection - Sub-Pedicular Approach with Fluoroscopic Guidance  Patient: Erica Beasley      Date of Birth: 05-19-1956 MRN: [id number] PCP: Loman Brooklyn, FNP      Visit Date: 07/08/2021   Universal Protocol:    Date/Time: 07/08/2021  Consent Given By: the patient  Position: PRONE  Additional Comments: Vital signs were monitored before and after the procedure. Patient was prepped and draped in the usual sterile fashion. The correct patient, procedure, and site was verified.   Injection Procedure Details:   Procedure diagnoses: Lumbar radiculopathy [M54.16]    Meds Administered:  Meds ordered this encounter  Medications   methylPREDNISolone acetate (DEPO-MEDROL) injection 80 mg    Laterality: Right  Location/Site: L5  Needle:5.0 in., 22 ga.  Short bevel or Quincke spinal needle  Needle Placement: Transforaminal  Findings:    -Comments: Excellent flow of contrast along the nerve, nerve root and into the epidural space.  Procedure Details: After squaring off the end-plates to get a true AP view, the C-arm was positioned so that an oblique view of the foramen as noted above was visualized. The target area is just inferior to the "nose of the scotty dog" or sub pedicular. The soft tissues overlying this structure were infiltrated with 2-3 ml. of 1% Lidocaine without Epinephrine.  The spinal needle was inserted toward the target using a "trajectory" view along the fluoroscope beam.  Under AP and lateral visualization, the needle was advanced so it did not puncture dura and was located close the 6 O'Clock position of the pedical in AP tracterory. Biplanar projections were used to confirm position. Aspiration was confirmed to be negative for CSF and/or blood. A 1-2 ml. volume of Isovue-250 was injected and flow of contrast was noted at each level. Radiographs were obtained for documentation purposes.   After attaining the desired flow  of contrast documented above, a 0.5 to 1.0 ml test dose of 0.25% Marcaine was injected into each respective transforaminal space.  The patient was observed for 90 seconds post injection.  After no sensory deficits were reported, and normal lower extremity motor function was noted,   the above injectate was administered so that equal amounts of the injectate were placed at each foramen (level) into the transforaminal epidural space.   Additional Comments:  The patient tolerated the procedure well Dressing: 2 x 2 sterile gauze and Band-Aid    Post-procedure details: Patient was observed during the procedure. Post-procedure instructions were reviewed.  Patient left the clinic in stable condition.

## 2021-07-22 NOTE — Progress Notes (Signed)
Erica Beasley - 65 y.o. female MRN [id number]  Date of birth: [birth year]  Office Visit Note: Visit Date: 07/08/2021 PCP: Loman Brooklyn, FNP Referred by: Loman Brooklyn, FNP  Subjective: Chief Complaint  Patient presents with   Lower Back - Pain   Right Leg - Pain   Left Knee - Pain   HPI:  Erica Beasley is a 65 y.o. female who comes in today at the request of Barnet Pall, FNP for planned Right L5-S1 Lumbar Transforaminal epidural steroid injection with fluoroscopic guidance.  The patient has failed conservative care including home exercise, medications, time and activity modification.  This injection will be diagnostic and hopefully therapeutic.  Please see requesting physician notes for further details and justification.   ROS Otherwise per HPI.  Assessment & Plan: Visit Diagnoses:    ICD-10-CM   1. Lumbar radiculopathy  M54.16 XR C-ARM NO REPORT    Epidural Steroid injection    methylPREDNISolone acetate (DEPO-MEDROL) injection 80 mg      Plan: No additional findings.   Meds & Orders:  Meds ordered this encounter  Medications   methylPREDNISolone acetate (DEPO-MEDROL) injection 80 mg    Orders Placed This Encounter  Procedures   XR C-ARM NO REPORT   Epidural Steroid injection    Follow-up: Return if symptoms worsen or fail to improve.   Procedures: No procedures performed  Lumbosacral Transforaminal Epidural Steroid Injection - Sub-Pedicular Approach with Fluoroscopic Guidance  Patient: Erica Beasley      Date of Birth: Mar 09, 1956 MRN: [id number] PCP: Loman Brooklyn, FNP      Visit Date: 07/08/2021   Universal Protocol:    Date/Time: 07/08/2021  Consent Given By: the patient  Position: PRONE  Additional Comments: Vital signs were monitored before and after the procedure. Patient was prepped and draped in the usual sterile fashion. The correct patient, procedure, and site was verified.   Injection Procedure Details:   Procedure diagnoses:  Lumbar radiculopathy [M54.16]    Meds Administered:  Meds ordered this encounter  Medications   methylPREDNISolone acetate (DEPO-MEDROL) injection 80 mg    Laterality: Right  Location/Site: L5  Needle:5.0 in., 22 ga.  Short bevel or Quincke spinal needle  Needle Placement: Transforaminal  Findings:    -Comments: Excellent flow of contrast along the nerve, nerve root and into the epidural space.  Procedure Details: After squaring off the end-plates to get a true AP view, the C-arm was positioned so that an oblique view of the foramen as noted above was visualized. The target area is just inferior to the "nose of the scotty dog" or sub pedicular. The soft tissues overlying this structure were infiltrated with 2-3 ml. of 1% Lidocaine without Epinephrine.  The spinal needle was inserted toward the target using a "trajectory" view along the fluoroscope beam.  Under AP and lateral visualization, the needle was advanced so it did not puncture dura and was located close the 6 O'Clock position of the pedical in AP tracterory. Biplanar projections were used to confirm position. Aspiration was confirmed to be negative for CSF and/or blood. A 1-2 ml. volume of Isovue-250 was injected and flow of contrast was noted at each level. Radiographs were obtained for documentation purposes.   After attaining the desired flow of contrast documented above, a 0.5 to 1.0 ml test dose of 0.25% Marcaine was injected into each respective transforaminal space.  The patient was observed for 90 seconds post injection.  After no sensory deficits were reported, and  normal lower extremity motor function was noted,   the above injectate was administered so that equal amounts of the injectate were placed at each foramen (level) into the transforaminal epidural space.   Additional Comments:  The patient tolerated the procedure well Dressing: 2 x 2 sterile gauze and Band-Aid    Post-procedure details: Patient was  observed during the procedure. Post-procedure instructions were reviewed.  Patient left the clinic in stable condition.    Clinical History: EXAM: MRI LUMBAR SPINE WITHOUT CONTRAST   TECHNIQUE: Multiplanar, multisequence MR imaging of the lumbar spine was performed. No intravenous contrast was administered.   COMPARISON:  Radiograph from 04/10/2021.   FINDINGS: Segmentation: Standard. Lowest well-formed disc space labeled the L5-S1 level.   Alignment: Moderate dextroscoliosis with apex at L3. Trace retrolisthesis of T11 on T12 and L1 on L2.   Vertebrae: Vertebral body height maintained without acute or chronic fracture. Bone marrow signal intensity within normal limits. Benign hemangioma noted within the right pedicle of L1. No other discrete or worrisome osseous lesions. Prominent discogenic reactive endplate change present about the L3-4 and L4-5 interspaces related to underlying scoliotic curvature. Mild reactive marrow edema also noted about the left L4-5 facet due to facet arthritis.   Conus medullaris and cauda equina: Conus extends to the T12-L1 level. Conus and cauda equina appear normal.   Paraspinal and other soft tissues: Unremarkable.   Disc levels:   T11-12: Seen only on sagittal projection. Disc bulge with disc desiccation with mild reactive endplate change. Mild facet hypertrophy. No significant stenosis.   T12-L1: Unremarkable.   L1-2: Mild disc bulge with disc desiccation and reactive endplate spurring. Disc bulging asymmetric to the right. Superimposed small central disc protrusion minimally indents the ventral thecal sac. Mild facet hypertrophy. No spinal stenosis. Mild right L1 foraminal narrowing. Left neural foramina remains patent.   L2-3: Degenerative intervertebral disc space narrowing with diffuse disc bulge and disc desiccation. Disc bulging eccentric to the left with associated left foraminal to extraforaminal disc protrusion (series 8,  image 18). Mild facet and ligament flavum hypertrophy. Mild narrowing of the left lateral recess. Central canal remains patent. Mild to moderate left L2 foraminal narrowing. Right neural foramina remains patent.   L3-4: Degenerative intervertebral disc space narrowing with disc desiccation and diffuse disc bulge, asymmetric to the left. Prominent reactive endplate spurring with marrow edema, also greater on the left. Resultant large extraforaminal disc osteophyte complex (series 7, image 23). Moderate left worse than right facet hypertrophy. Resultant mild narrowing of the left lateral recess. Central canal remains patent. Moderate left L3 foraminal stenosis. Right neural foramina remains patent.   L4-5: Degenerative intervertebral disc space narrowing with disc desiccation and diffuse disc bulge. Associated discogenic reactive endplate change with marginal endplate osteophytic spurring. Disc bulging eccentric to the right. Superimposed mild to moderate facet hypertrophy. Resultant mild to moderate right lateral recess narrowing. Central canal remains patent. Severe right with moderate left L4 foraminal stenosis (series 5, image 7).   L5-S1:  Normal interspace.  Mild facet hypertrophy.  No stenosis.   IMPRESSION: 1. 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 Query right L4 radiculitis. 2. Left eccentric disc bulge and reactive endplate changes at K1-6 with resultant moderate left L3 foraminal stenosis. 3. Left eccentric disc bulge with foraminal disc protrusion at L2-3 with resultant mild to moderate left lateral recess and foraminal stenosis. 4. Underlying moderate dextroscoliosis.     Electronically Signed  By: Jeannine Boga M.D.   On: 06/21/2021 03:28     Objective:  VS:  HT:    WT:   BMI:     BP:(!) 148/75  HR:97bpm  TEMP: ( )  RESP:  Physical Exam Vitals and nursing note  reviewed.  Constitutional:      General: She is not in acute distress.    Appearance: Normal appearance. She is not ill-appearing.  HENT:     Head: Normocephalic and atraumatic.     Right Ear: External ear normal.     Left Ear: External ear normal.  Eyes:     Extraocular Movements: Extraocular movements intact.  Cardiovascular:     Rate and Rhythm: Normal rate.     Pulses: Normal pulses.  Pulmonary:     Effort: Pulmonary effort is normal. No respiratory distress.  Abdominal:     General: There is no distension.     Palpations: Abdomen is soft.  Musculoskeletal:        General: Tenderness present.     Cervical back: Neck supple.     Right lower leg: No edema.     Left lower leg: No edema.     Comments: Patient has good distal strength with no pain over the greater trochanters.  No clonus or focal weakness.  Skin:    Findings: No erythema, lesion or rash.  Neurological:     General: No focal deficit present.     Mental Status: She is alert and oriented to person, place, and time.     Sensory: No sensory deficit.     Motor: No weakness or abnormal muscle tone.     Coordination: Coordination normal.  Psychiatric:        Mood and Affect: Mood normal.        Behavior: Behavior normal.     Imaging: No results found.

## 2021-07-26 ENCOUNTER — Encounter: Payer: Self-pay | Admitting: Physical Medicine and Rehabilitation

## 2021-07-27 ENCOUNTER — Other Ambulatory Visit (HOSPITAL_COMMUNITY): Payer: Self-pay

## 2021-07-27 ENCOUNTER — Other Ambulatory Visit: Payer: Self-pay

## 2021-07-27 ENCOUNTER — Ambulatory Visit (HOSPITAL_COMMUNITY)
Admission: RE | Admit: 2021-07-27 | Discharge: 2021-07-27 | Disposition: A | Discharge status: Home / Self Care | Payer: 59 | Source: Ambulatory Visit | Attending: Physician Assistant | Admitting: Physician Assistant

## 2021-07-27 DIAGNOSIS — M0579 Rheumatoid arthritis with rheumatoid factor of multiple sites without organ or systems involvement: Secondary | ICD-10-CM | POA: Insufficient documentation

## 2021-07-27 DIAGNOSIS — D472 Monoclonal gammopathy: Secondary | ICD-10-CM

## 2021-07-27 DIAGNOSIS — D649 Anemia, unspecified: Secondary | ICD-10-CM

## 2021-07-27 MED ORDER — SODIUM CHLORIDE 0.9 % IV SOLN
2.0000 mg/kg | Freq: Once | INTRAVENOUS | Status: DC
  Administered 2021-07-27: 105 mg via INTRAVENOUS
  Filled 2021-07-27: qty 8.4

## 2021-07-27 MED ORDER — ACETAMINOPHEN 500 MG PO TABS: 1000.0000 mg | ORAL_TABLET | Freq: Once | ORAL | Status: DC

## 2021-07-27 MED ORDER — FAMOTIDINE 20 MG PO TABS: 20.0000 mg | ORAL_TABLET | Freq: Once | ORAL | Status: DC

## 2021-07-27 NOTE — Progress Notes (Signed)
[facility] [place], [place] [address]   CLINIC:  Medical Oncology/Hematology  PCP:  Loman Brooklyn, [place] [address] [place] [address]  [phone]  REASON FOR VISIT:  Follow-up for monoclonal gammopathy and normocytic anemia  PRIOR THERAPY: none  CURRENT THERAPY: surveillance  INTERVAL HISTORY:  Erica Beasley, a 65 y.o. female, returns for routine follow-up for her monoclonal gammopathy and normocytic anemia. Erica Beasley was last seen on 06/21/2021.  Today she reports feeling good. She has not taken potassium in a week. She has gained 1 pound since her last visit. She is able to do her typical home activities without assistance, and her appetite is fair. She reports occasional diarrhea.   REVIEW OF SYSTEMS:  Review of Systems  Constitutional:  Negative for appetite change, fatigue (50%) and unexpected weight change (+ 1 lb).  Gastrointestinal:  Positive for diarrhea.  Musculoskeletal:  Positive for arthralgias (5/10 leg and hip).  Psychiatric/Behavioral:  Positive for sleep disturbance.   All other systems reviewed and are negative.  PAST MEDICAL/SURGICAL HISTORY:  Past Medical History:  Diagnosis Date   Aortic atherosclerosis ([facility]) 11/13/2020   CKD (chronic kidney disease) stage 2, GFR 60-89 ml/min    COPD (chronic obstructive pulmonary disease) ([facility])    COVID-19 06/2019   Depression    Essential hypertension    GERD (gastroesophageal reflux disease)    History of diabetes mellitus    Hyperlipidemia    Hypothyroidism    Neuropathy    Rheumatoid arthritis ([facility])    Vitamin B12 deficiency    Vitamin D deficiency    Past Surgical History:  Procedure Laterality Date   BIOPSY  09/16/2019   Procedure: BIOPSY;  Surgeon: Daneil Dolin, MD;  [facility];  Service: Endoscopy;;   BIOPSY  04/28/2020   Procedure: BIOPSY;  Surgeon: Daneil Dolin, MD;  [facility];  Service: Endoscopy;;   BIOPSY  06/08/2020    Procedure: BIOPSY;  Surgeon: Daneil Dolin, MD;  [facility];  Service: Endoscopy;;   CATARACT EXTRACTION Bilateral    COLONOSCOPY  09/2010   Dr. Posey Pronto: mild diverticulsis in sigmoid colon.    COLONOSCOPY WITH PROPOFOL N/A 09/16/2019   Rourk: Diverticulosis, random colon biopsies negative for microscopic colitis.   COLONOSCOPY WITH PROPOFOL N/A 06/08/2020   Procedure: COLONOSCOPY WITH PROPOFOL;  Surgeon: Daneil Dolin, MD;  [facility];  Service: Endoscopy;  Laterality: N/A;  9:15am   ESOPHAGOGASTRODUODENOSCOPY (EGD) WITH PROPOFOL N/A 04/28/2020   Procedure: ESOPHAGOGASTRODUODENOSCOPY (EGD) WITH PROPOFOL;  Surgeon: Daneil Dolin, MD;  [facility];  Service: Endoscopy;  Laterality: N/A;  9:32   YAG LASER APPLICATION Left 35/57/3220   Procedure: YAG LASER APPLICATION;  Surgeon: Williams Che, MD;  Location: [facility];  Service: Ophthalmology;  Laterality: Left;    SOCIAL HISTORY:  Social History   Socioeconomic History   Marital status: Widowed    Spouse name: Not on file   Number of children: 1   Years of education: Not on file   Highest education level: Not on file  Occupational History   Occupation: DISABLED  Tobacco Use   Smoking status: Former    Packs/day: 1.00    Years: 15.00    Pack years: 15.00    Types: Cigarettes    Quit date: 09/11/2017    Years since quitting: 3.8   Smokeless tobacco: Former  Scientific laboratory technician Use:  Never used  Substance and Sexual Activity   Alcohol use: Never   Drug use: Never   Sexual activity: Not Currently  Other Topics Concern   Not on file  Social History Narrative   Not on file   Social Determinants of Health   Financial Resource Strain: Low Risk    Difficulty of Paying Living Expenses: Not hard at all  Food Insecurity: No Food Insecurity   Worried About Charity fundraiser in the Last Year: Never true   [place] in the Last Year: Never true  Transportation Needs: No Transportation  Needs   Lack of Transportation (Medical): No   Lack of Transportation (Non-Medical): No  Physical Activity: Insufficiently Active   Days of Exercise per Week: 3 days   Minutes of Exercise per Session: 30 min  Stress: No Stress Concern Present   Feeling of Stress : Only a little  Social Connections: Socially Isolated   Frequency of Communication with Friends and Family: More than three times a week   Frequency of Social Gatherings with Friends and Family: More than three times a week   Attends Religious Services: Never   Marine scientist or Organizations: No   Attends Archivist Meetings: Never   Marital Status: Widowed  Human resources officer Violence: Not At Risk   Fear of Current or Ex-Partner: No   Emotionally Abused: No   Physically Abused: No   Sexually Abused: No    FAMILY HISTORY:  Family History  Problem Relation Age of Onset   Stroke Mother    Heart disease Mother    Diabetes Mother    Breast cancer Mother    Other Father        stomach taken out for some reason   Migraines Daughter    Colon cancer Neg Hx     CURRENT MEDICATIONS:  Current Outpatient Medications  Medication Sig Dispense Refill   aspirin 81 MG EC tablet Take 1 tablet (81 mg total) by mouth daily. Swallow whole. 30 tablet 12   baclofen (LIORESAL) 10 MG tablet Take 0.5-1 tablets (5-10 mg total) by mouth 3 (three) times daily as needed for muscle spasms. 30 each 3   Cholecalciferol (VITAMIN D3) 125 MCG (5000 UT) TABS Take 1 tablet (5,000 Units total) by mouth daily. 30 tablet 3   citalopram (CELEXA) 40 MG tablet Take 1 tablet (40 mg total) by mouth daily. 90 tablet 1   gabapentin (NEURONTIN) 300 MG capsule TAKE (1) CAPSULE THREE TIMES DAILY. 90 capsule 1   HYDROcodone-acetaminophen (NORCO/VICODIN) 5-325 MG tablet Take 1 tablet by mouth every 6 (six) hours as needed for moderate pain. 20 tablet 0   hydroxychloroquine (PLAQUENIL) 200 MG tablet Take 200 mg by mouth daily.      leflunomide  (ARAVA) 20 MG tablet Take 20 mg by mouth daily.     levothyroxine (SYNTHROID) 100 MCG tablet Take 1 tablet (100 mcg total) by mouth every morning. 90 tablet 1   lisinopril (ZESTRIL) 10 MG tablet Take 1 tablet (10 mg total) by mouth daily. (Patient taking differently: Take 5 mg by mouth daily.) 90 tablet 1   loperamide (IMODIUM A-D) 2 MG tablet Take 1 tablet (2 mg total) by mouth in the morning. Take second dose if needed (Patient taking differently: Take 2 mg by mouth as needed. Take second dose if needed) 30 tablet 0   Pancrelipase, Lip-Prot-Amyl, (CREON) 24000-76000 units CPEP Take 1-2 capsules (24,000-48,000 Units total) by mouth See admin instructions.  Take 48,000 units (2 capsules) with meals and 24000 units (1 capsule) with snacks 240 capsule 3   pantoprazole (PROTONIX) 40 MG tablet TAKE ONE TABLET BY MOUTH DAILY BEFORE BREAKFAST 90 tablet 1   potassium chloride SA (KLOR-CON) 20 MEQ tablet Take 1 tablet (20 mEq total) by mouth daily. (NEEDS TO BE SEEN BEFORE NEXT REFILL) 30 tablet 0   pravastatin (PRAVACHOL) 40 MG tablet TAKE ONE TABLET AT BEDTIME 90 tablet 0   predniSONE (DELTASONE) 5 MG tablet Take 5 mg by mouth daily. Pt states she takes with severe arthritic pain     raloxifene (EVISTA) 60 MG tablet 1 tablet     sulfaDIAZINE 500 MG tablet Take 500 mg by mouth 2 (two) times daily.     sulfaSALAzine (AZULFIDINE) 500 MG tablet 1 tablet     traZODone (DESYREL) 100 MG tablet TAKE 1 TABLET AT BEDTIME AS NEEDED FOR SLEEP 90 tablet 1   No current facility-administered medications for this visit.    ALLERGIES:  Allergies  Allergen Reactions   Adalimumab     Other reaction(s): no response Pt is unaware of this    PHYSICAL EXAM:  Performance status (ECOG): 1 - Symptomatic but completely ambulatory  There were no vitals filed for this visit. Wt Readings from Last 3 Encounters:  07/27/21 121 lb (54.9 kg)  06/22/21 120 lb 6.4 oz (54.6 kg)  06/21/21 121 lb 9.6 oz (55.2 kg)   Physical  Exam Vitals reviewed.  Constitutional:      Appearance: Normal appearance.  Cardiovascular:     Rate and Rhythm: Normal rate and regular rhythm.     Pulses: Normal pulses.     Heart sounds: Normal heart sounds.  Pulmonary:     Effort: Pulmonary effort is normal.     Breath sounds: Normal breath sounds.  Neurological:     General: No focal deficit present.     Mental Status: She is alert and oriented to person, place, and time.  Psychiatric:        Mood and Affect: Mood normal.        Behavior: Behavior normal.    LABORATORY DATA:  I have reviewed the labs as listed.  CBC Latest Ref Rng & Units 07/28/2021 07/19/2021 06/14/2021  WBC 4.0 - 10.5 K/uL 4.0 5.8 6.9  Hemoglobin 12.0 - 15.0 g/dL 9.2(L) 8.7(L) 8.8(L)  Hematocrit 36.0 - 46.0 % 30.9(L) 29.9(L) 30.7(L)  Platelets 150 - 400 K/uL 228 285 306   CMP Latest Ref Rng & Units 07/28/2021 06/14/2021 02/16/2021  Glucose 70 - 99 mg/dL 155(H) 178(H) 169(H)  BUN 8 - 23 mg/dL '9 8 10  ' Creatinine 0.44 - 1.00 mg/dL 0.73 0.77 1.01(H)  Sodium 135 - 145 mmol/L 142 138 139  Potassium 3.5 - 5.1 mmol/L 2.8(L) 3.5 4.0  Chloride 98 - 111 mmol/L 105 102 99  CO2 22 - 32 mmol/L '31 28 25  ' Calcium 8.9 - 10.3 mg/dL 8.7(L) 7.6(L) 9.5  Total Protein 6.5 - 8.1 g/dL 6.1(L) 6.1(L) 7.0  Total Bilirubin 0.3 - 1.2 mg/dL 0.2(L) 0.4 <0.2  Alkaline Phos 38 - 126 U/L 106 96 136(H)  AST 15 - 41 U/L 11(L) 9(L) 6  ALT 0 - 44 U/L '10 8 6      ' Component Value Date/Time   RBC 3.21 (L) 07/28/2021 1013   MCV 96.3 07/28/2021 1013   MCV 93 11/13/2020 1129   MCH 28.7 07/28/2021 1013   MCHC 29.8 (L) 07/28/2021 1013   RDW 16.6 (H) 07/28/2021 1013  RDW 12.7 11/13/2020 1129   LYMPHSABS 2.3 07/28/2021 1013   LYMPHSABS 2.4 11/13/2020 1129   MONOABS 0.4 07/28/2021 1013   EOSABS 0.2 07/28/2021 1013   EOSABS 0.1 11/13/2020 1129   BASOSABS 0.0 07/28/2021 1013   BASOSABS 0.0 11/13/2020 1129    DIAGNOSTIC IMAGING:  I have independently reviewed the scans and discussed with  the patient. Epidural Steroid injection  Result Date: 07/08/2021 Magnus Sinning, MD     07/22/2021  5:57 AM Lumbosacral Transforaminal Epidural Steroid Injection - Sub-Pedicular Approach with Fluoroscopic Guidance Patient: NORENA BRATTON     Date of Birth: Oct 12, 1955 MRN: [id number] PCP: Loman Brooklyn, FNP     Visit Date: 07/08/2021  Universal Protocol:   Date/Time: 07/08/2021 Consent Given By: the patient Position: PRONE Additional Comments: Vital signs were monitored before and after the procedure. Patient was prepped and draped in the usual sterile fashion. The correct patient, procedure, and site was verified. Injection Procedure Details: Procedure diagnoses: Lumbar radiculopathy [M54.16]  Meds Administered: Meds ordered this encounter Medications  methylPREDNISolone acetate (DEPO-MEDROL) injection 80 mg Laterality: Right Location/Site: L5 Needle:5.0 in., 22 ga.  Short bevel or Quincke spinal needle Needle Placement: Transforaminal Findings:   -Comments: Excellent flow of contrast along the nerve, nerve root and into the epidural space. Procedure Details: After squaring off the end-plates to get a true AP view, the C-arm was positioned so that an oblique view of the foramen as noted above was visualized. The target area is just inferior to the "nose of the scotty dog" or sub pedicular. The soft tissues overlying this structure were infiltrated with 2-3 ml. of 1% Lidocaine without Epinephrine. The spinal needle was inserted toward the target using a "trajectory" view along the fluoroscope beam.  Under AP and lateral visualization, the needle was advanced so it did not puncture dura and was located close the 6 O'Clock position of the pedical in AP tracterory. Biplanar projections were used to confirm position. Aspiration was confirmed to be negative for CSF and/or blood. A 1-2 ml. volume of Isovue-250 was injected and flow of contrast was noted at each level. Radiographs were obtained for documentation  purposes. After attaining the desired flow of contrast documented above, a 0.5 to 1.0 ml test dose of 0.25% Marcaine was injected into each respective transforaminal space.  The patient was observed for 90 seconds post injection.  After no sensory deficits were reported, and normal lower extremity motor function was noted,   the above injectate was administered so that equal amounts of the injectate were placed at each foramen (level) into the transforaminal epidural space. Additional Comments: The patient tolerated the procedure well Dressing: 2 x 2 sterile gauze and Band-Aid  Post-procedure details: Patient was observed during the procedure. Post-procedure instructions were reviewed. Patient left the clinic in stable condition.   CT Biopsy  Result Date: 07/19/2021 INDICATION: ANEMIA, MONOCLONAL GAMMOPATHY EXAM: CT GUIDED RIGHT ILIAC BONE MARROW ASPIRATION AND CORE BIOPSY Date:  07/19/2021 07/19/2021 10:01 am Radiologist:  M. Daryll Brod, MD Guidance:  CT FLUOROSCOPY TIME:  Fluoroscopy Time: None. MEDICATIONS: 1% lidocaine local ANESTHESIA/SEDATION: 2.0 mg IV Versed; 100 mcg IV Fentanyl Moderate Sedation Time:  10 minutes The patient was continuously monitored during the procedure by the interventional radiology nurse under my direct supervision. CONTRAST:  None COMPLICATIONS: None PROCEDURE: Informed consent was obtained from the patient following explanation of the procedure, risks, benefits and alternatives. The patient understands, agrees and consents for the procedure. All questions were addressed. A time out  was performed. The patient was positioned prone and non-contrast localization CT was performed of the pelvis to demonstrate the iliac marrow spaces. Maximal barrier sterile technique utilized including caps, mask, sterile gowns, sterile gloves, large sterile drape, hand hygiene, and Betadine prep. Under sterile conditions and local anesthesia, an 11 gauge coaxial bone biopsy needle was advanced into  the right iliac marrow space. Needle position was confirmed with CT imaging. Initially, bone marrow aspiration was performed. Next, the 11 gauge outer cannula was utilized to obtain a right iliac bone marrow core biopsy. Needle was removed. Hemostasis was obtained with compression. The patient tolerated the procedure well. Samples were prepared with the cytotechnologist. No immediate complications. IMPRESSION: CT guided right iliac bone marrow aspiration and core biopsy. Electronically Signed   By: Jerilynn Mages.  Shick M.D.   On: 07/19/2021 10:33   CT BONE MARROW BIOPSY  Result Date: 07/19/2021 INDICATION: ANEMIA, MONOCLONAL GAMMOPATHY EXAM: CT GUIDED RIGHT ILIAC BONE MARROW ASPIRATION AND CORE BIOPSY Date:  07/19/2021 07/19/2021 10:01 am Radiologist:  M. Daryll Brod, MD Guidance:  CT FLUOROSCOPY TIME:  Fluoroscopy Time: None. MEDICATIONS: 1% lidocaine local ANESTHESIA/SEDATION: 2.0 mg IV Versed; 100 mcg IV Fentanyl Moderate Sedation Time:  10 minutes The patient was continuously monitored during the procedure by the interventional radiology nurse under my direct supervision. CONTRAST:  None COMPLICATIONS: None PROCEDURE: Informed consent was obtained from the patient following explanation of the procedure, risks, benefits and alternatives. The patient understands, agrees and consents for the procedure. All questions were addressed. A time out was performed. The patient was positioned prone and non-contrast localization CT was performed of the pelvis to demonstrate the iliac marrow spaces. Maximal barrier sterile technique utilized including caps, mask, sterile gowns, sterile gloves, large sterile drape, hand hygiene, and Betadine prep. Under sterile conditions and local anesthesia, an 11 gauge coaxial bone biopsy needle was advanced into the right iliac marrow space. Needle position was confirmed with CT imaging. Initially, bone marrow aspiration was performed. Next, the 11 gauge outer cannula was utilized to obtain a  right iliac bone marrow core biopsy. Needle was removed. Hemostasis was obtained with compression. The patient tolerated the procedure well. Samples were prepared with the cytotechnologist. No immediate complications. IMPRESSION: CT guided right iliac bone marrow aspiration and core biopsy. Electronically Signed   By: Jerilynn Mages.  Shick M.D.   On: 07/19/2021 10:33   XR C-ARM NO REPORT  Result Date: 07/08/2021 Please see Notes tab for imaging impression.    ASSESSMENT:  1.  Bence-Jones proteinuria: -24-hour urine done by Dr. Theador Hawthorne showed urine immunofixation positive for Bence-Jones proteinuria, kappa type.  24-hour total protein was 77 mg/day. -Free kappa light chains were 52.9, lambda light chains 29.8 with ratio of 1.78.  Serum immunofixation was unremarkable.  SPEP did not show any evidence of M spike. -Labs on 04/03/2020 shows negative immunofixation.  Kappa light chain 61.3, lambda light chains 32.3, ratio 1.9. -Skeletal survey on 04/03/2020 was negative for lytic lesions.   2.  Weight loss: -25 pound weight loss in the last 2 months. -She smoked cigarettes 1 pack/day for 46 years, and quit 3 years ago. -She reports that she cannot eat due to decreased appetite.  Also feels very nauseous on smelling of foods. -EGD on 04/28/2020 with stomach and duodenal biopsy negative. -CT CAP on 05/11/2020 showed long segment colonic thickening likely diverticular disease.  Stable small juxtapleural nodule in the right upper chest benign over 1 year.  Moderate large fat-containing left inguinal hernia.  Emphysema and diverticular  calcification. -Colonoscopy on 06/08/2020 shows pancolonic diverticulosis, mild.  Normal-appearing colonic mucosa otherwise.  Random biopsies were negative.   3.  Normocytic anemia: -CBC on 03/20/2020 shows hemoglobin 9.9 with MCV of 93.  White count and platelets were normal. -Colonoscopy on 09/16/2019 shows diverticulosis in the entire examined colon.  Nonbleeding internal hemorrhoids.    4.  Rheumatoid arthritis: -She is on Plaquenil and Areva for the last 4 years.   PLAN:  1.  Bence-Jones proteinuria: - Myeloma labs on 06/14/2021 shows SPEP is negative.  Creatinine and calcium were normal.  Kappa light chains are elevated at 44, lambda light chains 26 and ratio of 1.7.  Serum immunofixation was unremarkable. 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  No lytic lesions were found.    2.  Weight loss: - She gained about 1 pound. - She stopped taking Creon tablets about 2 months ago.   3.  Normocytic to macrocytic anemia: - Nutritional deficiency work-up was negative.  SPEP was negative. - We reviewed bone marrow aspiration and biopsy from 07/19/2021.  Mildly hypercellular bone marrow with trilineage hematopoiesis.  No increased ring sideroblasts.  No increase in blasts.  The 3% plasma cells, polytypic.  No large aggregates of plasma cells.  Iron stores are increased.  Chromosome analysis was normal. - CBC today shows hemoglobin 9.2 with MCV 96.  Creatinine is 0.73. - She denies any bleeding per rectum or melena.  We will repeat anemia panel at next visit in 4 months.   4.  Depression: - Continue Celexa 40 mg daily which is helping.   5.  Hypokalemia: - She ran out of potassium about a month ago. - Potassium today is 2.8.  She will be given potassium in the office. - We will restart her on K-Dur 20 mEq daily.  Orders placed this encounter:  No orders of the defined types were placed in this encounter.    Derek Jack, MD [place] [phone]   I, Thana Ates, am acting as a scribe for Dr. Derek Jack.  I, Derek Jack MD, have reviewed the above documentation for accuracy and completeness, and I agree with the above.

## 2021-07-28 ENCOUNTER — Inpatient Hospital Stay (HOSPITAL_COMMUNITY): Payer: 59 | Attending: Hematology

## 2021-07-28 ENCOUNTER — Ambulatory Visit (HOSPITAL_COMMUNITY)
Admission: RE | Admit: 2021-07-28 | Discharge: 2021-07-28 | Disposition: A | Discharge status: Home / Self Care | Payer: 59 | Source: Ambulatory Visit | Attending: Family Medicine | Admitting: Family Medicine

## 2021-07-28 ENCOUNTER — Inpatient Hospital Stay (HOSPITAL_BASED_OUTPATIENT_CLINIC_OR_DEPARTMENT_OTHER): Payer: 59 | Admitting: Hematology

## 2021-07-28 ENCOUNTER — Encounter (HOSPITAL_COMMUNITY): Payer: Self-pay | Admitting: Hematology

## 2021-07-28 DIAGNOSIS — D539 Nutritional anemia, unspecified: Secondary | ICD-10-CM | POA: Diagnosis not present

## 2021-07-28 DIAGNOSIS — M069 Rheumatoid arthritis, unspecified: Secondary | ICD-10-CM | POA: Insufficient documentation

## 2021-07-28 DIAGNOSIS — Z1231 Encounter for screening mammogram for malignant neoplasm of breast: Secondary | ICD-10-CM | POA: Insufficient documentation

## 2021-07-28 DIAGNOSIS — F32A Depression, unspecified: Secondary | ICD-10-CM | POA: Insufficient documentation

## 2021-07-28 DIAGNOSIS — D649 Anemia, unspecified: Secondary | ICD-10-CM

## 2021-07-28 DIAGNOSIS — D472 Monoclonal gammopathy: Secondary | ICD-10-CM

## 2021-07-28 DIAGNOSIS — R634 Abnormal weight loss: Secondary | ICD-10-CM | POA: Insufficient documentation

## 2021-07-28 DIAGNOSIS — E876 Hypokalemia: Secondary | ICD-10-CM

## 2021-07-28 DIAGNOSIS — Z803 Family history of malignant neoplasm of breast: Secondary | ICD-10-CM | POA: Insufficient documentation

## 2021-07-28 DIAGNOSIS — Z87891 Personal history of nicotine dependence: Secondary | ICD-10-CM | POA: Diagnosis not present

## 2021-07-28 DIAGNOSIS — R803 Bence Jones proteinuria: Secondary | ICD-10-CM | POA: Insufficient documentation

## 2021-07-28 DIAGNOSIS — Z79899 Other long term (current) drug therapy: Secondary | ICD-10-CM | POA: Insufficient documentation

## 2021-07-28 LAB — COMPREHENSIVE METABOLIC PANEL
ALT: 10 U/L (ref 0–44)
AST: 11 U/L — ABNORMAL LOW (ref 15–41)
Albumin: 3.2 g/dL — ABNORMAL LOW (ref 3.5–5.0)
Alkaline Phosphatase: 106 U/L (ref 38–126)
Anion gap: 6 (ref 5–15)
BUN: 9 mg/dL (ref 8–23)
CO2: 31 mmol/L (ref 22–32)
Calcium: 8.7 mg/dL — ABNORMAL LOW (ref 8.9–10.3)
Chloride: 105 mmol/L (ref 98–111)
Creatinine, Ser: 0.73 mg/dL (ref 0.44–1.00)
GFR, Estimated: >60 mL/min (ref >60)
Glucose, Bld: 155 mg/dL — ABNORMAL HIGH (ref 70–99)
Potassium: 2.8 mmol/L — ABNORMAL LOW (ref 3.5–5.1)
Sodium: 142 mmol/L (ref 135–145)
Total Bilirubin: 0.2 mg/dL — ABNORMAL LOW (ref 0.3–1.2)
Total Protein: 6.1 g/dL — ABNORMAL LOW (ref 6.5–8.1)

## 2021-07-28 LAB — CBC WITH DIFFERENTIAL/PLATELET
Abs Immature Granulocytes: 0.01 10*3/uL (ref 0.00–0.07)
Basophils Absolute: 0 10*3/uL (ref 0.0–0.1)
Basophils Relative: 0 %
Eosinophils Absolute: 0.2 10*3/uL (ref 0.0–0.5)
Eosinophils Relative: 4 %
HCT: 30.9 % — ABNORMAL LOW (ref 36.0–46.0)
Hemoglobin: 9.2 g/dL — ABNORMAL LOW (ref 12.0–15.0)
Immature Granulocytes: 0 %
Lymphocytes Relative: 57 %
Lymphs Abs: 2.3 10*3/uL (ref 0.7–4.0)
MCH: 28.7 pg (ref 26.0–34.0)
MCHC: 29.8 g/dL — ABNORMAL LOW (ref 30.0–36.0)
MCV: 96.3 fL (ref 80.0–100.0)
Monocytes Absolute: 0.4 10*3/uL (ref 0.1–1.0)
Monocytes Relative: 9 %
Neutro Abs: 1.2 10*3/uL — ABNORMAL LOW (ref 1.7–7.7)
Neutrophils Relative %: 30 %
Platelets: 228 10*3/uL (ref 150–400)
RBC: 3.21 MIL/uL — ABNORMAL LOW (ref 3.87–5.11)
RDW: 16.6 % — ABNORMAL HIGH (ref 11.5–15.5)
WBC: 4 10*3/uL (ref 4.0–10.5)
nRBC: 0 % (ref 0.0–0.2)

## 2021-07-28 LAB — LACTATE DEHYDROGENASE: LDH: 147 U/L (ref 98–192)

## 2021-07-28 IMAGING — MG MM DIGITAL SCREENING BILAT W/ TOMO AND CAD
6 of 10 series · 6 of 30 positions shown · non-contrast
Comparison: Previous exam(s).

CLINICAL DATA: Screening.

EXAM:
DIGITAL SCREENING BILATERAL MAMMOGRAM WITH TOMOSYNTHESIS AND CAD
TECHNIQUE: Bilateral screening digital craniocaudal and mediolateral oblique
mammograms were obtained. Bilateral screening digital breast
tomosynthesis was performed. The images were evaluated with
computer-aided detection.

[R CC synth-2D (1 of 2)]
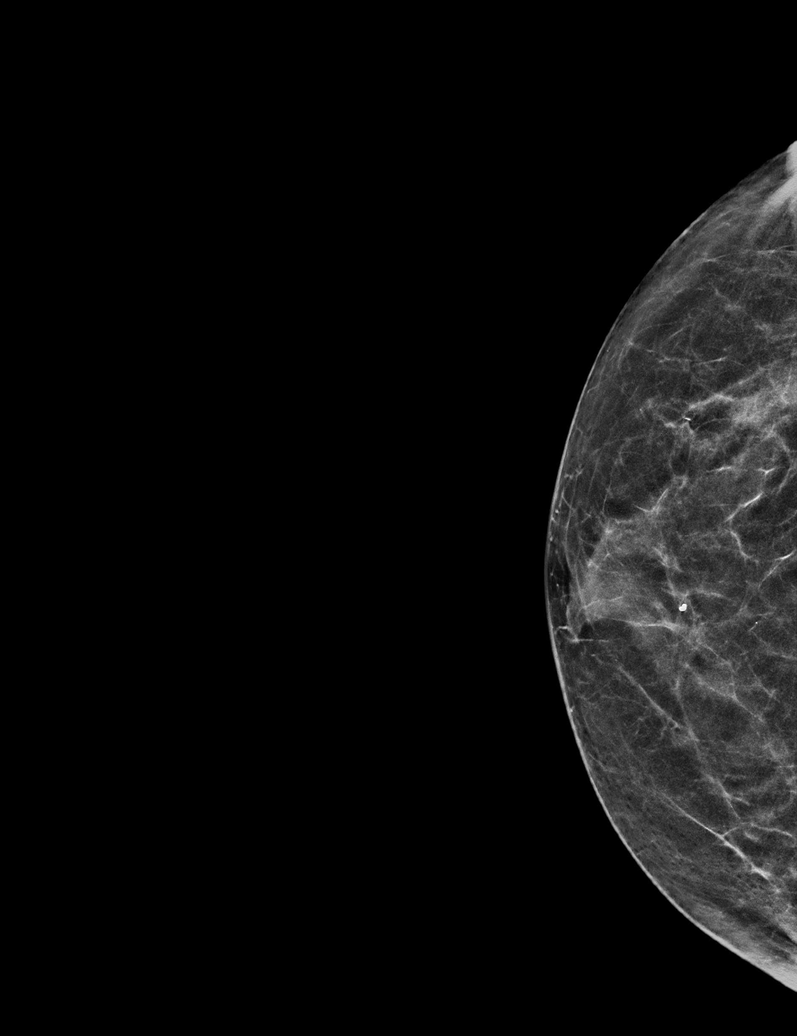

[L CC synth-2D]
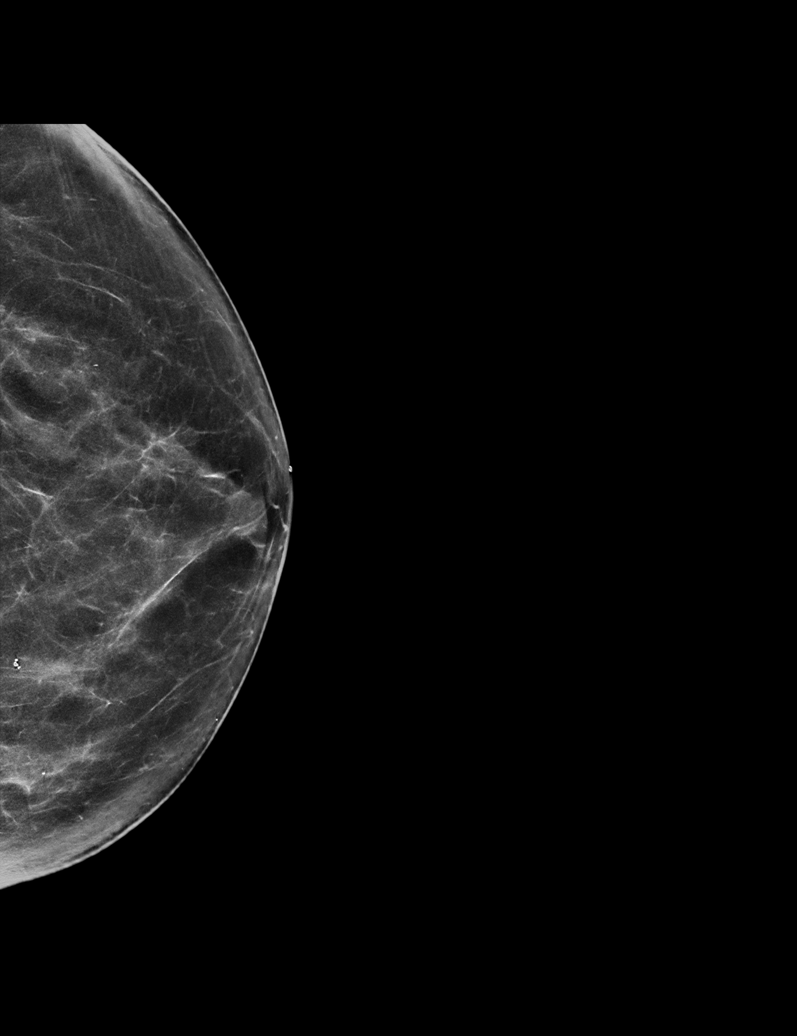

[R CC synth-2D (2 of 2)]
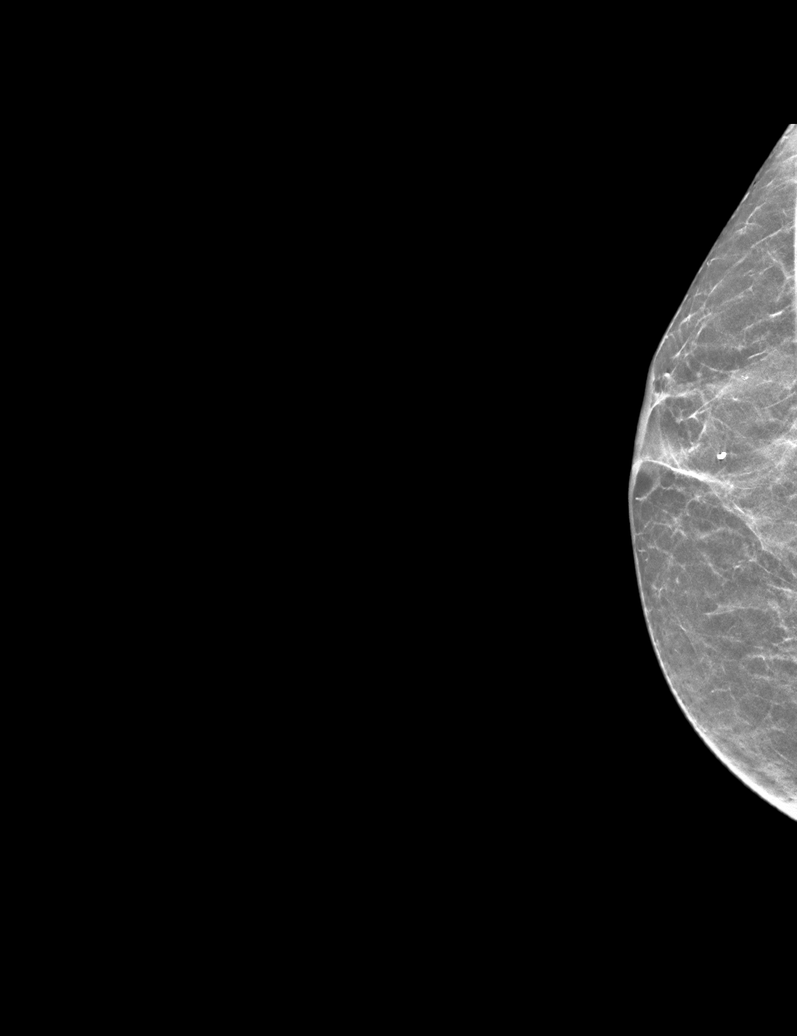

[L MLO synth-2D]
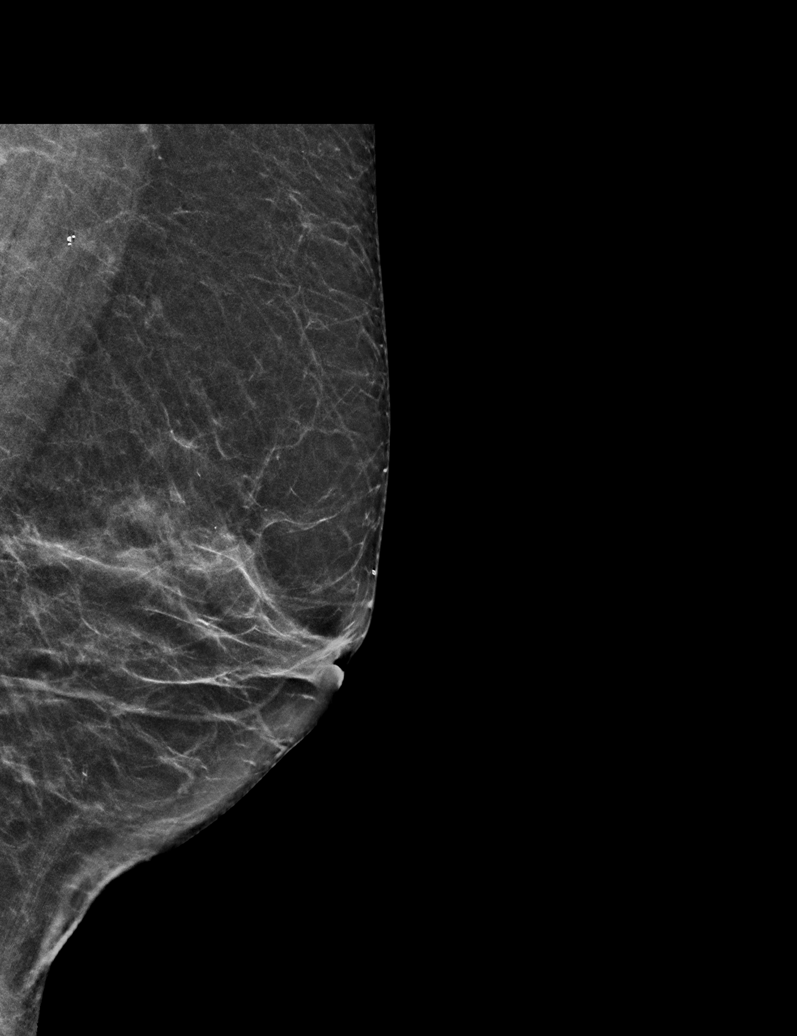

[R MLO synth-2D]
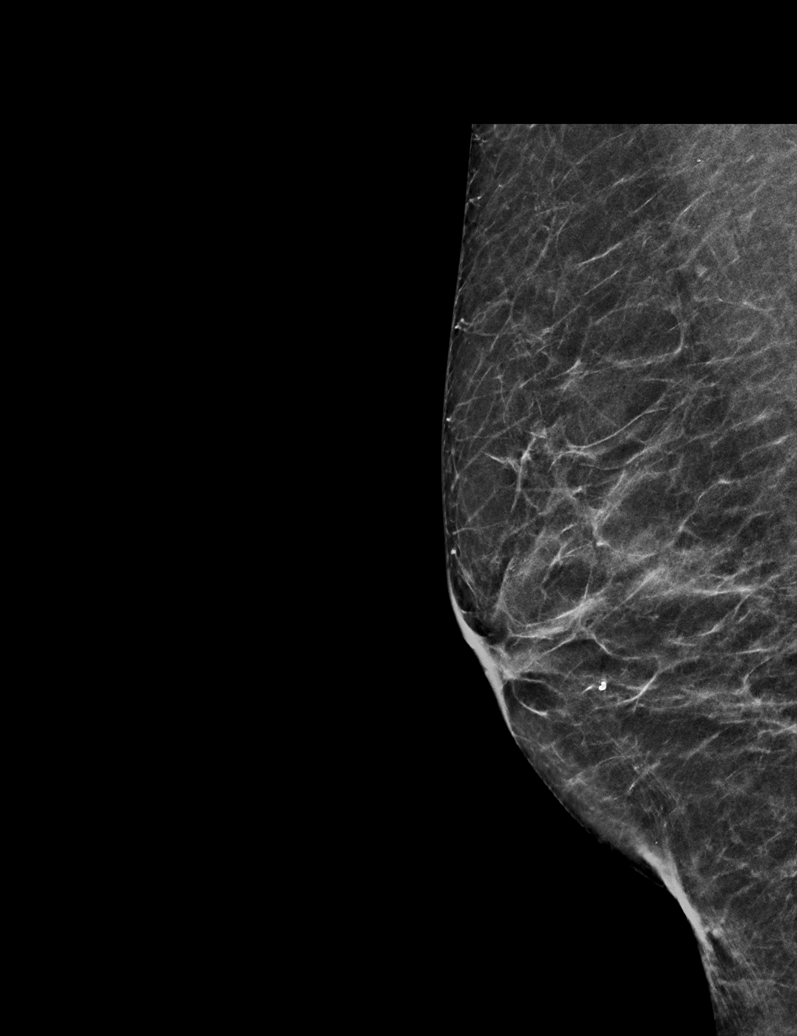

[R CC tomo · tomo slice 21/40.0]
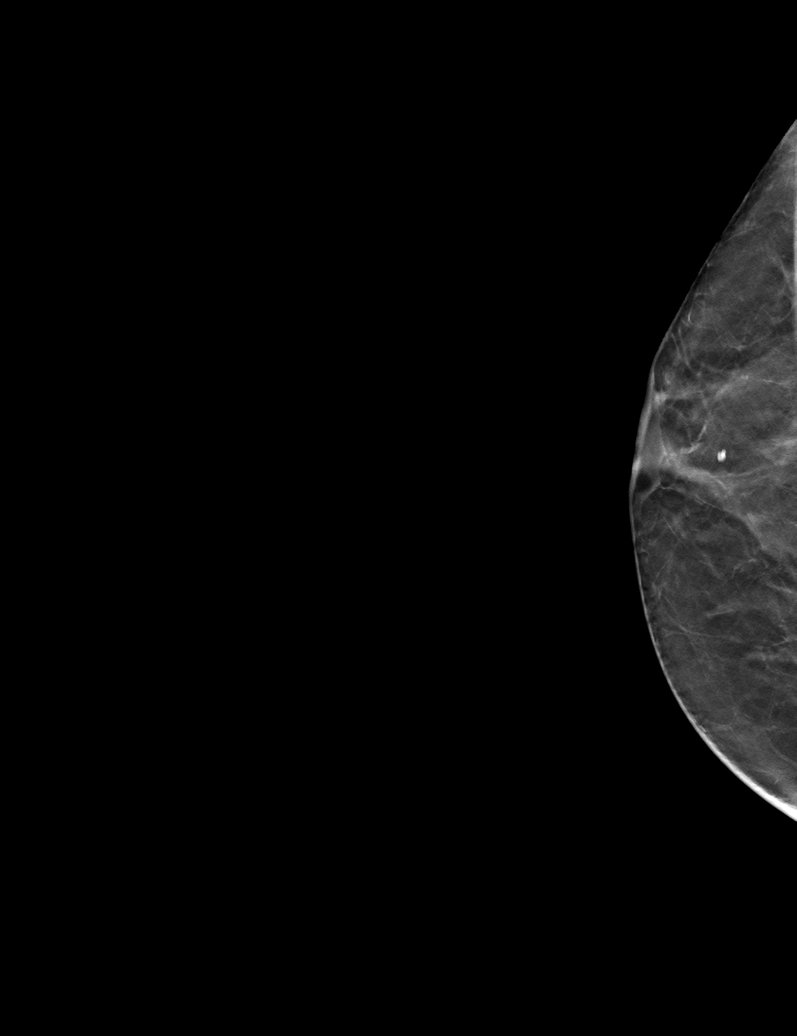

[6 of 30 positions shown; findings below may reference images not displayed]

ACR Breast Density Category b: There are scattered areas of
fibroglandular density.
FINDINGS: There are no findings suspicious for malignancy.
IMPRESSION: No mammographic evidence of malignancy. A result letter of this
screening mammogram will be mailed directly to the patient.

RECOMMENDATION:
Screening mammogram in one year. (Code:[BY])

BI-RADS CATEGORY  1: Negative.

## 2021-07-28 MED ORDER — POTASSIUM CHLORIDE CRYS ER 20 MEQ PO TBCR
40.0000 meq | EXTENDED_RELEASE_TABLET | Freq: Once | ORAL | Status: AC
  Administered 2021-07-28: 40 meq via ORAL
  Filled 2021-07-28: qty 2

## 2021-07-28 MED ORDER — POTASSIUM CHLORIDE CRYS ER 20 MEQ PO TBCR: 20.0000 meq | EXTENDED_RELEASE_TABLET | Freq: Every day | ORAL | 6 refills | Status: DC

## 2021-07-28 NOTE — Progress Notes (Signed)
Patient given Potassium as ordered on MAR. Patient swallowed them with no difficulty and remained stable throughout visit. Patient discharged ambulatory and in stable condition.

## 2021-07-28 NOTE — Patient Instructions (Addendum)
[place] at [facility] Discharge Instructions  You were seen and examined by Dr. Delton Coombes. He reviewed you bone marrow biopsy results, which were normal. You remain anemic - your hemoglobin was 9.2. We will see you back in four months to review the results of your blood work.    Thank you for choosing [place] at [facility] to provide your oncology and hematology care.  To afford each patient quality time with our provider, please arrive at least 15 minutes before your scheduled appointment time.   If you have a lab appointment with the [facility] please come in thru the Main Entrance and check in at the main information desk.  You need to re-schedule your appointment should you arrive 10 or more minutes late.  We strive to give you quality time with our providers, and arriving late affects you and other patients whose appointments are after yours.  Also, if you no show three or more times for appointments you may be dismissed from the clinic at the providers discretion.     Again, thank you for choosing [facility].  Our hope is that these requests will decrease the amount of time that you wait before being seen by our physicians.       _____________________________________________________________  Should you have questions after your visit to [facility], please contact our office at [phone] and follow the prompts.  Our office hours are 8:00 a.m. and 4:30 p.m. Monday - Friday.  Please note that voicemails left after 4:00 p.m. may not be returned until the following business day.  We are closed weekends and major holidays.  You do have access to a nurse 24-7, just call the main number to the clinic [phone] and do not press any options, hold on the line and a nurse will answer the phone.    For prescription refill requests, have your pharmacy contact our office and allow 72 hours.    Due to Covid, you  will need to wear a mask upon entering the hospital. If you do not have a mask, a mask will be given to you at the Main Entrance upon arrival. For doctor visits, patients may have 1 support person age 88 or older with them. For treatment visits, patients can not have anyone with them due to social distancing guidelines and our immunocompromised population.

## 2021-07-29 ENCOUNTER — Other Ambulatory Visit: Payer: Self-pay

## 2021-07-29 ENCOUNTER — Ambulatory Visit (INDEPENDENT_AMBULATORY_CARE_PROVIDER_SITE_OTHER): Payer: 59 | Admitting: Physical Medicine and Rehabilitation

## 2021-07-29 ENCOUNTER — Other Ambulatory Visit: Payer: Self-pay | Admitting: Family Medicine

## 2021-07-29 ENCOUNTER — Other Ambulatory Visit (HOSPITAL_COMMUNITY): Payer: Self-pay | Admitting: Hematology

## 2021-07-29 ENCOUNTER — Encounter: Payer: Self-pay | Admitting: Physical Medicine and Rehabilitation

## 2021-07-29 VITALS — BP 134/82 | HR 81

## 2021-07-29 DIAGNOSIS — M47819 Spondylosis without myelopathy or radiculopathy, site unspecified: Secondary | ICD-10-CM | POA: Diagnosis not present

## 2021-07-29 DIAGNOSIS — M5416 Radiculopathy, lumbar region: Secondary | ICD-10-CM | POA: Diagnosis not present

## 2021-07-29 DIAGNOSIS — M48061 Spinal stenosis, lumbar region without neurogenic claudication: Secondary | ICD-10-CM | POA: Diagnosis not present

## 2021-07-29 DIAGNOSIS — F339 Major depressive disorder, recurrent, unspecified: Secondary | ICD-10-CM

## 2021-07-29 DIAGNOSIS — M5116 Intervertebral disc disorders with radiculopathy, lumbar region: Secondary | ICD-10-CM | POA: Diagnosis not present

## 2021-07-29 DIAGNOSIS — I10 Essential (primary) hypertension: Secondary | ICD-10-CM

## 2021-07-29 DIAGNOSIS — E1165 Type 2 diabetes mellitus with hyperglycemia: Secondary | ICD-10-CM

## 2021-07-29 NOTE — Progress Notes (Signed)
Erica Beasley - 65 y.o. female MRN [id number]  Date of birth: [birth year]  Office Visit Note: Visit Date: 07/29/2021 PCP: Loman Brooklyn, FNP Referred by: Loman Brooklyn, FNP  Subjective: Chief Complaint  Patient presents with   Lower Back - Pain   Right Leg - Pain   Right Shoulder - Pain   HPI: Erica Beasley is a 65 y.o. female who comes in today for evaluation of chronic, worsening and severe right sided lower back pain radiating to buttock, hip and leg. Patient reports chronic back issues for years. Patient reports greater than 50% relief from recent right L5 transforaminal epidural steroid injection on 07/08/21, however patient reports greater than 80% pain relief from right L5-S1 interlaminar epidural steroid injection she received in August of this year. Patient reports she feels like the previous injection in August helped to increase her functional ability and allowed her to walk without significant pain. Patient reports pain worsens with walking and activity, describes as soreness and shooting pain. Currently rates as 8 out of 10. Patient reports some relief with Ibuprofen and Tylenol. Patient's recent lumbar MRI exhibits right disc bulge with facet hypertrophy at L4-5 with severe right and moderate left L4 foraminal stenosis, no high grade spinal canal stenosis noted. Patient reports she attended sessions with chiropractor Dr. Dwana Curd, which did not help to alleviate pain. Patient also reports she recently began physical therapy at [facility], however she was not able to go back due to insurance issues. Patient denies focal weakness, numbness and tingling. Patient denies recent trauma or falls.   Review of Systems  Musculoskeletal:  Positive for back pain.  Neurological:  Negative for tingling, sensory change, focal weakness and weakness.  All other systems reviewed and are negative. Otherwise per HPI.  Assessment & Plan: Visit Diagnoses:     ICD-10-CM   1. Lumbar radiculopathy  M54.16 Ambulatory referral to Physical Medicine Rehab    Ambulatory referral to Physical Therapy    2. Foraminal stenosis of lumbar region  M48.061     3. Facet arthropathy  M47.819     4. Intervertebral disc disorders with radiculopathy, lumbar region  M51.16        Plan: Findings:  Chronic, worsening and severe right sided lower back pain radiating to leg. Patient continue to have excruciating pain despite good conservative therapies such as formal physical therapy, rest and use of medications. Patient's clinical presentation and exam are consistent with classic L5 nerve pattern. We believe the next step would be to perform a diagnostic and hopefully therapeutic right L4-L5 interlaminar epidural steroid injection under fluoroscopic guidance. We also talked with patient about re-grouping with formal physical therapy, which we did place a referral for today. Patient states she prefers to go to physical therapy in [place], [place] because this facility is close to her home. Patient encouraged to continue being active as tolerated. No red flag symptoms noted upon exam today.    Meds & Orders: No orders of the defined types were placed in this encounter.   Orders Placed This Encounter  Procedures   Ambulatory referral to Physical Medicine Rehab   Ambulatory referral to Physical Therapy    Follow-up: Return in about 1 week (around 08/05/2021) for Right L4-L5 interlaminar epidural steroid injection.   Procedures: No procedures performed      Clinical History: EXAM: MRI LUMBAR SPINE WITHOUT CONTRAST   TECHNIQUE: Multiplanar, multisequence MR imaging of the lumbar spine was performed. No intravenous  contrast was administered.   COMPARISON:  Radiograph from 04/10/2021.   FINDINGS: Segmentation: Standard. Lowest well-formed disc space labeled the L5-S1 level.   Alignment: Moderate dextroscoliosis with apex at L3. Trace retrolisthesis of T11 on T12  and L1 on L2.   Vertebrae: Vertebral body height maintained without acute or chronic fracture. Bone marrow signal intensity within normal limits. Benign hemangioma noted within the right pedicle of L1. No other discrete or worrisome osseous lesions. Prominent discogenic reactive endplate change present about the L3-4 and L4-5 interspaces related to underlying scoliotic curvature. Mild reactive marrow edema also noted about the left L4-5 facet due to facet arthritis.   Conus medullaris and cauda equina: Conus extends to the T12-L1 level. Conus and cauda equina appear normal.   Paraspinal and other soft tissues: Unremarkable.   Disc levels:   T11-12: Seen only on sagittal projection. Disc bulge with disc desiccation with mild reactive endplate change. Mild facet hypertrophy. No significant stenosis.   T12-L1: Unremarkable.   L1-2: Mild disc bulge with disc desiccation and reactive endplate spurring. Disc bulging asymmetric to the right. Superimposed small central disc protrusion minimally indents the ventral thecal sac. Mild facet hypertrophy. No spinal stenosis. Mild right L1 foraminal narrowing. Left neural foramina remains patent.   L2-3: Degenerative intervertebral disc space narrowing with diffuse disc bulge and disc desiccation. Disc bulging eccentric to the left with associated left foraminal to extraforaminal disc protrusion (series 8, image 18). Mild facet and ligament flavum hypertrophy. Mild narrowing of the left lateral recess. Central canal remains patent. Mild to moderate left L2 foraminal narrowing. Right neural foramina remains patent.   L3-4: Degenerative intervertebral disc space narrowing with disc desiccation and diffuse disc bulge, asymmetric to the left. Prominent reactive endplate spurring with marrow edema, also greater on the left. Resultant large extraforaminal disc osteophyte complex (series 7, image 23). Moderate left worse than right  facet hypertrophy. Resultant mild narrowing of the left lateral recess. Central canal remains patent. Moderate left L3 foraminal stenosis. Right neural foramina remains patent.   L4-5: Degenerative intervertebral disc space narrowing with disc desiccation and diffuse disc bulge. Associated discogenic reactive endplate change with marginal endplate osteophytic spurring. Disc bulging eccentric to the right. Superimposed mild to moderate facet hypertrophy. Resultant mild to moderate right lateral recess narrowing. Central canal remains patent. Severe right with moderate left L4 foraminal stenosis (series 5, image 7).   L5-S1:  Normal interspace.  Mild facet hypertrophy.  No stenosis.   IMPRESSION: 1. 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 Query right L4 radiculitis. 2. Left eccentric disc bulge and reactive endplate changes at I1-4 with resultant moderate left L3 foraminal stenosis. 3. Left eccentric disc bulge with foraminal disc protrusion at L2-3 with resultant mild to moderate left lateral recess and foraminal stenosis. 4. Underlying moderate dextroscoliosis.     Electronically Signed   By: Jeannine Boga M.D.   On: 06/21/2021 03:28   She reports that she quit smoking about 3 years ago. Her smoking use included cigarettes. She has a 15.00 pack-year smoking history. She has quit using smokeless tobacco.  Recent Labs    11/13/20 1122 02/16/21 0951  HGBA1C 6.4 6.3    Objective:  VS:  HT:    WT:   BMI:     BP:134/82  HR:81bpm  TEMP: ( )  RESP:  Physical Exam Vitals and nursing note reviewed.  HENT:     Head: Normocephalic and  atraumatic.     Right Ear: External ear normal.     Left Ear: External ear normal.     Nose: Nose normal.     Mouth/Throat:     Mouth: Mucous membranes are moist.  Eyes:     Extraocular Movements: Extraocular movements intact.  Cardiovascular:      Rate and Rhythm: Normal rate.     Pulses: Normal pulses.  Pulmonary:     Effort: Pulmonary effort is normal.  Abdominal:     General: Abdomen is flat. There is no distension.  Musculoskeletal:        General: Tenderness present.     Cervical back: Normal range of motion.     Comments: Pt rises from seated position to standing without difficulty. Good lumbar range of motion. Strong distal strength without clonus, no pain upon palpation of greater trochanters. Sensation intact bilaterally. Dysesthesias noted to right L5 dermatome. Walks independently, gait steady.     Skin:    General: Skin is warm and dry.     Capillary Refill: Capillary refill takes less than 2 seconds.  Neurological:     General: No focal deficit present.     Mental Status: She is alert.  Psychiatric:        Mood and Affect: Mood normal.    Ortho Exam  Imaging: No results found.  Past Medical/Family/Surgical/Social History: Medications & Allergies reviewed per EMR, new medications updated. Patient Active Problem List   Diagnosis Date Noted   Hypothyroidism 11/13/2020   Prediabetes 11/13/2020   B12 deficiency 11/13/2020   Chronic obstructive pulmonary disease ([place]) 11/13/2020   Vitamin D deficiency 11/13/2020   Neuropathy 11/13/2020   Rheumatoid arthritis involving multiple sites ([place]) 11/13/2020   Mixed hyperlipidemia 11/13/2020   Aortic atherosclerosis ([place]) 11/13/2020   GERD (gastroesophageal reflux disease) 11/05/2020   Depression, recurrent ([place]) 09/22/2020   Essential hypertension 09/22/2020   Dehydration 08/13/2020   Hypokalemia 08/13/2020   Abnormal CT of the abdomen 06/02/2020   N&V (nausea and vomiting) 04/08/2020   Monoclonal gammopathy 04/03/2020   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 03/26/2020   Hx of colonic polyps 05/03/2019   H/O Clostridium difficile infection 05/03/2019   Normocytic anemia 05/03/2019   Chronic diarrhea 01/29/2019   Past Medical History:   Diagnosis Date   Aortic atherosclerosis ([place]) 11/13/2020   CKD (chronic kidney disease) stage 2, GFR 60-89 ml/min    COPD (chronic obstructive pulmonary disease) ([place])    COVID-19 06/2019   Depression    Essential hypertension    GERD (gastroesophageal reflux disease)    History of diabetes mellitus    Hyperlipidemia    Hypothyroidism    Neuropathy    Rheumatoid arthritis ([facility])    Vitamin B12 deficiency    Vitamin D deficiency    Family History  Problem Relation Age of Onset   Stroke Mother    Heart disease Mother    Diabetes Mother    Breast cancer Mother    Other Father        stomach taken out for some reason   Migraines Daughter    Colon cancer Neg Hx    Past Surgical History:  Procedure Laterality Date   BIOPSY  09/16/2019   Procedure: BIOPSY;  Surgeon: Daneil Dolin, MD;  Location: [facility];  Service: Endoscopy;;   BIOPSY  04/28/2020   Procedure: BIOPSY;  Surgeon: Daneil Dolin, MD;  Location: [facility];  Service: Endoscopy;;   BIOPSY  06/08/2020  Procedure: BIOPSY;  Surgeon: Daneil Dolin, MD;  Location: [facility];  Service: Endoscopy;;   CATARACT EXTRACTION Bilateral    COLONOSCOPY  09/2010   Dr. Posey Pronto: mild diverticulsis in sigmoid colon.    COLONOSCOPY WITH PROPOFOL N/A 09/16/2019   Rourk: Diverticulosis, random colon biopsies negative for microscopic colitis.   COLONOSCOPY WITH PROPOFOL N/A 06/08/2020   Procedure: COLONOSCOPY WITH PROPOFOL;  Surgeon: Daneil Dolin, MD;  Location: [facility];  Service: Endoscopy;  Laterality: N/A;  9:15am   ESOPHAGOGASTRODUODENOSCOPY (EGD) WITH PROPOFOL N/A 04/28/2020   Procedure: ESOPHAGOGASTRODUODENOSCOPY (EGD) WITH PROPOFOL;  Surgeon: Daneil Dolin, MD;  [facility]: [facility];  Service: Endoscopy;  Laterality: N/A;  9:16   YAG LASER APPLICATION Left 60/60/0459   Procedure: YAG LASER APPLICATION;  Surgeon: Williams Che, MD;  Location: [facility];  Service: Ophthalmology;  Laterality: Left;   Social  History   Occupational History   Occupation: DISABLED  Tobacco Use   Smoking status: Former    Packs/day: 1.00    Years: 15.00    Pack years: 15.00    Types: Cigarettes    Quit date: 09/11/2017    Years since quitting: 3.8   Smokeless tobacco: Former  Scientific laboratory technician Use: Never used  Substance and Sexual Activity   Alcohol use: Never   Drug use: Never   Sexual activity: Not Currently

## 2021-07-29 NOTE — Progress Notes (Signed)
Pt state lower back pain that travels down her right leg. Pt state walking makes the pain worse. Pt state she takes over the counter pain.Pt has hx of inj on 07/08/21 pt state it helped. Pt state she is having right shoulder pain.  Numeric Pain Rating Scale and Functional Assessment Average Pain 4 Pain Right Now 3 My pain is intermittent and aching Pain is worse with: walking Pain improves with: medication and injections   In the last MONTH (on 0-10 scale) has pain interfered with the following?  1. General activity like being  able to carry out your everyday physical activities such as walking, climbing stairs, carrying groceries, or moving a chair?  Rating(4)  2. Relation with others like being able to carry out your usual social activities and roles such as  activities at home, at work and in your community. Rating(5)  3. Enjoyment of life such that you have  been bothered by emotional problems such as feeling anxious, depressed or irritable?  Rating(6)

## 2021-07-31 LAB — SURGICAL PATHOLOGY

## 2021-08-02 ENCOUNTER — Telehealth: Payer: Self-pay | Admitting: Physical Medicine and Rehabilitation

## 2021-08-02 NOTE — Telephone Encounter (Signed)
Pt's daughter Delene Ruffini returned call to [facility] to set appt for pt. Please call pt daughter at [phone].

## 2021-08-04 ENCOUNTER — Ambulatory Visit: Payer: 59 | Admitting: Family Medicine

## 2021-08-06 ENCOUNTER — Encounter: Payer: Self-pay | Admitting: Family Medicine

## 2021-08-06 ENCOUNTER — Other Ambulatory Visit: Payer: Self-pay

## 2021-08-06 ENCOUNTER — Ambulatory Visit (INDEPENDENT_AMBULATORY_CARE_PROVIDER_SITE_OTHER): Payer: 59 | Admitting: Family Medicine

## 2021-08-06 VITALS — BP 128/82 | HR 83 | Temp 94.4°F | Ht 64.0 in | Wt 121.8 lb

## 2021-08-06 DIAGNOSIS — Z23 Encounter for immunization: Secondary | ICD-10-CM | POA: Diagnosis not present

## 2021-08-06 DIAGNOSIS — E559 Vitamin D deficiency, unspecified: Secondary | ICD-10-CM | POA: Diagnosis not present

## 2021-08-06 DIAGNOSIS — G629 Polyneuropathy, unspecified: Secondary | ICD-10-CM

## 2021-08-06 DIAGNOSIS — J449 Chronic obstructive pulmonary disease, unspecified: Secondary | ICD-10-CM

## 2021-08-06 DIAGNOSIS — E538 Deficiency of other specified B group vitamins: Secondary | ICD-10-CM | POA: Diagnosis not present

## 2021-08-06 DIAGNOSIS — M0579 Rheumatoid arthritis with rheumatoid factor of multiple sites without organ or systems involvement: Secondary | ICD-10-CM

## 2021-08-06 DIAGNOSIS — M25562 Pain in left knee: Secondary | ICD-10-CM

## 2021-08-06 DIAGNOSIS — R600 Localized edema: Secondary | ICD-10-CM

## 2021-08-06 DIAGNOSIS — F339 Major depressive disorder, recurrent, unspecified: Secondary | ICD-10-CM | POA: Diagnosis not present

## 2021-08-06 DIAGNOSIS — R7303 Prediabetes: Secondary | ICD-10-CM

## 2021-08-06 DIAGNOSIS — I10 Essential (primary) hypertension: Secondary | ICD-10-CM

## 2021-08-06 DIAGNOSIS — E782 Mixed hyperlipidemia: Secondary | ICD-10-CM

## 2021-08-06 DIAGNOSIS — K219 Gastro-esophageal reflux disease without esophagitis: Secondary | ICD-10-CM

## 2021-08-06 DIAGNOSIS — I700 Atherosclerosis of aorta: Secondary | ICD-10-CM

## 2021-08-06 DIAGNOSIS — E039 Hypothyroidism, unspecified: Secondary | ICD-10-CM

## 2021-08-06 DIAGNOSIS — E876 Hypokalemia: Secondary | ICD-10-CM

## 2021-08-06 LAB — BAYER DCA HB A1C WAIVED: HB A1C (BAYER DCA - WAIVED): 5.9 % — ABNORMAL HIGH (ref 4.8–5.6)

## 2021-08-06 NOTE — Progress Notes (Signed)
Assessment & Plan:  1. Depression, recurrent ([place]) Well controlled on current regimen.  - CMP14+EGFR  2. Vitamin D deficiency Well controlled on current regimen.   3. B12 deficiency WNL  4. Neuropathy Well controlled on current regimen.  - CMP14+EGFR  5. Rheumatoid arthritis involving multiple sites with positive rheumatoid factor ([place]) Managed by rheuamtology. - CMP14+EGFR  6. Acquired hypothyroidism Labs to assess. - T4, free - TSH  7. Essential hypertension Controlled with lifestyle.  8. Gastroesophageal reflux disease, unspecified whether esophagitis present Well controlled on current regimen.  - CMP14+EGFR  9. Mixed hyperlipidemia Well controlled on current regimen.  - CMP14+EGFR  10. Aortic atherosclerosis (H[facility]) Continue statin. Not taking ASA due to chronic prednisone use for RA.  11. Hypokalemia Labs to assess for improvement. - CMP14+EGFR  12. COPD without exacerbation ([place]) Well controlled on current regimen.  - Pneumococcal conjugate vaccine 20-valent (Prevnar 20) - albuterol (VENTOLIN HFA) 108 (90 Base) MCG/ACT inhaler; Inhale 2 puffs into the lungs every 6 (six) hours as needed for wheezing or shortness of breath.  13. Prediabetes Controlled with lifestyle. - Bayer DCA Hb A1c Waived  14. Acute pain of left knee Encouraged to wrap knee with ACE bandage. Tylenol for pain.   15. Localized edema Education provided on edema. Encouraged use of compression hose.  16. Immunization due - Pneumococcal conjugate vaccine 20-valent (Prevnar 20)   Return in about 6 months (around 02/03/2022) for annual physical.  Hendricks Limes, MSN, APRN, FNP-C [facility]  Subjective:    Patient ID: Erica Beasley, female    DOB: 06/16/1956, 65 y.o.   MRN: [id number]  Patient Care Team: Loman Brooklyn, FNP as PCP - General (Family Medicine) Gala Romney Cristopher Estimable, MD as Consulting Physician (Gastroenterology) Liana Gerold, MD as  Consulting Physician (Nephrology) Jacquelin Hawking, NP as Nurse Practitioner (Oncology) [facility] as Consulting Physician (Rheumatology)   Chief Complaint:  Chief Complaint  Patient presents with   Medical Management of Chronic Issues   Foot Swelling    Left x 10 days   Knee Pain    Left x 5 days    HPI: Erica Beasley is a 65 y.o. female presenting on 08/06/2021 for Medical Management of Chronic Issues, Foot Swelling (Left x 10 days), and Knee Pain (Left x 5 days)  Depression: Doing well with Celexa and trazodone.  Depression screen [facility] 2/9 08/06/2021 06/03/2021 04/06/2021  Decreased Interest 0 0 0  Down, Depressed, Hopeless 0 - 0  PHQ - 2 Score 0 0 0  Altered sleeping 0 0 0  Tired, decreased energy 0 3 0  Change in appetite 0 2 0  Feeling bad or failure about yourself  0 0 0  Trouble concentrating 0 0 0  Moving slowly or fidgety/restless 0 0 0  Suicidal thoughts 0 0 0  PHQ-9 Score 0 5 0  Difficult doing work/chores Not difficult at all Not difficult at all Not difficult at all   GAD 7 : Generalized Anxiety Score 08/06/2021 06/03/2021 04/06/2021 02/16/2021  Nervous, Anxious, on Edge 0 0 0 0  Control/stop worrying 0 0 0 0  Worry too much - different things 0 0 0 0  Trouble relaxing 0 0 0 0  Restless 0 0 0 0  Easily annoyed or irritable 0 0 0 0  Afraid - awful might happen 0 0 0 0  Total GAD 7 Score 0 0 0 0  Anxiety Difficulty Not difficult at all Not difficult at all -  Not difficult at all    Vitamin D deficiency: Taking a supplement once daily. Last vitamin D level WNL on 06/14/2021.   Vitamin B12 deficiency: Managed by the oncologist. Last vitamin B12 level on 06/14/2021 WNL.  Neuropathy: Controlled with gabapentin.  Rheumatoid arthritis: Patient goes to [facility] rheumatology.  She is currently taking Plaquenil, prednisone, and Arava.    Hypothyroidism: taking levothyroxine 100 mcg daily.  Hypertension: patient is no longer taking Lisinopril and her  blood pressure readings have been good. Patient has been eating healthy.  She walks for exercise.  GERD: Managed by gastroenterologist.  Controlled with Protonix.  Hyperlipidemia: Doing well with pravastatin.  Aortic Atherosclerosis: taking pravastatin. Not taking ASA due to chronic steroid use for her RA.  Hypokalemia: patient had ran out of potassium for about a week prior to seeing her oncologist earlier this month at which time her potassium was down to 2.8. She was given a dose in his office and a refill was sent on the prescription, which patient reports she has been taking consistently since then.   COPD: patient was prescribed an Albuterol inhaler, but states she has not needed to use it.   Pre-diabetes: Patient reports she has not had any diabetic medication since April 2021.  She has been eating healthy since October 2021.  She walks for exercise.  She does not check her blood sugar at home.  She is on a statin.  CKD: Managed by Dr. Theador Hawthorne, nephrologist, who recently added sulfadizine and sulfasalazine.  New complaints: Patient reports left knee pain and left foot swelling. She states her left foot occasionally swells, but that it will go back down. No injuries or falls.    Social history:  Relevant past medical, surgical, family and social history reviewed and updated as indicated. Interim medical history since our last visit reviewed.  Allergies and medications reviewed and updated.  DATA REVIEWED: CHART IN EPIC  ROS: Negative unless specifically indicated above in HPI.    Current Outpatient Medications:    Cholecalciferol (VITAMIN D3) 125 MCG (5000 UT) TABS, Take 1 tablet (5,000 Units total) by mouth daily., Disp: 30 tablet, Rfl: 3   citalopram (CELEXA) 40 MG tablet, TAKE 1/2 TABLET ONCE A DAY AS DIRECTED, Disp: 30 tablet, Rfl: 5   gabapentin (NEURONTIN) 300 MG capsule, TAKE (1) CAPSULE THREE TIMES DAILY., Disp: 90 capsule, Rfl: 1   hydroxychloroquine (PLAQUENIL) 200  MG tablet, Take 200 mg by mouth daily. , Disp: , Rfl:    leflunomide (ARAVA) 20 MG tablet, Take 20 mg by mouth daily., Disp: , Rfl:    levothyroxine (SYNTHROID) 100 MCG tablet, Take 1 tablet (100 mcg total) by mouth every morning., Disp: 90 tablet, Rfl: 1   loperamide (IMODIUM A-D) 2 MG tablet, Take 1 tablet (2 mg total) by mouth in the morning. Take second dose if needed (Patient taking differently: Take 2 mg by mouth as needed. Take second dose if needed), Disp: 30 tablet, Rfl: 0   Pancrelipase, Lip-Prot-Amyl, (CREON) 24000-76000 units CPEP, Take 1-2 capsules (24,000-48,000 Units total) by mouth See admin instructions. Take 48,000 units (2 capsules) with meals and 24000 units (1 capsule) with snacks, Disp: 240 capsule, Rfl: 3   pantoprazole (PROTONIX) 40 MG tablet, TAKE ONE TABLET BY MOUTH DAILY BEFORE BREAKFAST, Disp: 90 tablet, Rfl: 1   potassium chloride SA (KLOR-CON) 20 MEQ tablet, Take 1 tablet (20 mEq total) by mouth daily. (NEEDS TO BE SEEN BEFORE NEXT REFILL), Disp: 30 tablet, Rfl: 6   pravastatin (PRAVACHOL)  40 MG tablet, TAKE ONE TABLET AT BEDTIME, Disp: 90 tablet, Rfl: 0   predniSONE (DELTASONE) 5 MG tablet, Take 5 mg by mouth daily. Pt states she takes with severe arthritic pain, Disp: , Rfl:    raloxifene (EVISTA) 60 MG tablet, 1 tablet, Disp: , Rfl:    sulfaDIAZINE 500 MG tablet, Take 500 mg by mouth 2 (two) times daily., Disp: , Rfl:    sulfaSALAzine (AZULFIDINE) 500 MG tablet, 1 tablet, Disp: , Rfl:    traZODone (DESYREL) 100 MG tablet, TAKE 1 TABLET AT BEDTIME AS NEEDED FOR SLEEP, Disp: 90 tablet, Rfl: 1   aspirin 81 MG EC tablet, Take 1 tablet (81 mg total) by mouth daily. Swallow whole. (Patient not taking: Reported on 08/06/2021), Disp: 30 tablet, Rfl: 12   baclofen (LIORESAL) 10 MG tablet, Take 0.5-1 tablets (5-10 mg total) by mouth 3 (three) times daily as needed for muscle spasms. (Patient not taking: Reported on 08/06/2021), Disp: 30 each, Rfl: 3   lisinopril (ZESTRIL) 10 MG  tablet, TAKE 1 TABLET DAILY (Patient not taking: Reported on 08/06/2021), Disp: 90 tablet, Rfl: 0   Allergies  Allergen Reactions   Adalimumab     Other reaction(s): no response Pt is unaware of this   Past Medical History:  Diagnosis Date   Aortic atherosclerosis ([place]) 11/13/2020   CKD (chronic kidney disease) stage 2, GFR 60-89 ml/min    COPD (chronic obstructive pulmonary disease) ([place])    COVID-19 06/2019   Depression    Essential hypertension    GERD (gastroesophageal reflux disease)    History of diabetes mellitus    Hyperlipidemia    Hypothyroidism    Neuropathy    Rheumatoid arthritis (HCC)    Vitamin B12 deficiency    Vitamin D deficiency     Past Surgical History:  Procedure Laterality Date   BIOPSY  09/16/2019   Procedure: BIOPSY;  Surgeon: Daneil Dolin, MD;  [facility]: [facility];  Service: Endoscopy;;   BIOPSY  04/28/2020   Procedure: BIOPSY;  Surgeon: Daneil Dolin, MD;  [facility];  Service: Endoscopy;;   BIOPSY  06/08/2020   Procedure: BIOPSY;  Surgeon: Daneil Dolin, MD;  [facility]: [facility];  Service: Endoscopy;;   CATARACT EXTRACTION Bilateral    COLONOSCOPY  09/2010   Dr. Posey Pronto: mild diverticulsis in sigmoid colon.    COLONOSCOPY WITH PROPOFOL N/A 09/16/2019   Rourk: Diverticulosis, random colon biopsies negative for microscopic colitis.   COLONOSCOPY WITH PROPOFOL N/A 06/08/2020   Procedure: COLONOSCOPY WITH PROPOFOL;  Surgeon: Daneil Dolin, MD;  [facility];  Service: Endoscopy;  Laterality: N/A;  9:15am   ESOPHAGOGASTRODUODENOSCOPY (EGD) WITH PROPOFOL N/A 04/28/2020   Procedure: ESOPHAGOGASTRODUODENOSCOPY (EGD) WITH PROPOFOL;  Surgeon: Daneil Dolin, MD;  [facility];  Service: Endoscopy;  Laterality: N/A;  1:19   YAG LASER APPLICATION Left 41/74/0814   Procedure: YAG LASER APPLICATION;  Surgeon: Williams Che, MD;  Location: [facility];  Service: Ophthalmology;  Laterality: Left;    Social History    Socioeconomic History   Marital status: Widowed    Spouse name: Not on file   Number of children: 1   Years of education: Not on file   Highest education level: Not on file  Occupational History   Occupation: DISABLED  Tobacco Use   Smoking status: Former    Packs/day: 1.00    Years: 15.00    Pack years: 15.00    Types: Cigarettes  Quit date: 09/11/2017    Years since quitting: 3.9   Smokeless tobacco: Former  Scientific laboratory technician Use: Never used  Substance and Sexual Activity   Alcohol use: Never   Drug use: Never   Sexual activity: Not Currently  Other Topics Concern   Not on file  Social History Narrative   Not on file   Social Determinants of Health   Financial Resource Strain: Low Risk    Difficulty of Paying Living Expenses: Not hard at all  Food Insecurity: No Food Insecurity   Worried About Charity fundraiser in the Last Year: Never true   [place] in the Last Year: Never true  Transportation Needs: No Transportation Needs   Lack of Transportation (Medical): No   Lack of Transportation (Non-Medical): No  Physical Activity: Insufficiently Active   Days of Exercise per Week: 3 days   Minutes of Exercise per Session: 30 min  Stress: No Stress Concern Present   Feeling of Stress : Only a little  Social Connections: Socially Isolated   Frequency of Communication with Friends and Family: More than three times a week   Frequency of Social Gatherings with Friends and Family: More than three times a week   Attends Religious Services: Never   Marine scientist or Organizations: No   Attends Archivist Meetings: Never   Marital Status: Widowed  Human resources officer Violence: Not At Risk   Fear of Current or Ex-Partner: No   Emotionally Abused: No   Physically Abused: No   Sexually Abused: No        Objective:    BP 128/82   Pulse 83   Temp (!) 94.4 F (34.7 C) (Temporal)   Ht $R'5\' 4"'TJ$  (1.626 m)   Wt 121 lb 12.8 oz (55.2 kg)   BMI  20.91 kg/m   Wt Readings from Last 3 Encounters:  08/06/21 121 lb 12.8 oz (55.2 kg)  07/27/21 121 lb (54.9 kg)  06/22/21 120 lb 6.4 oz (54.6 kg)    Physical Exam Vitals reviewed.  Constitutional:      General: She is not in acute distress.    Appearance: Normal appearance. She is normal weight. She is not ill-appearing, toxic-appearing or diaphoretic.  HENT:     Head: Normocephalic and atraumatic.  Eyes:     General: No scleral icterus.       Right eye: No discharge.        Left eye: No discharge.     Conjunctiva/sclera: Conjunctivae normal.  Cardiovascular:     Rate and Rhythm: Normal rate and regular rhythm.     Heart sounds: Normal heart sounds. No murmur heard.   No friction rub. No gallop.  Pulmonary:     Effort: Pulmonary effort is normal. No respiratory distress.     Breath sounds: Normal breath sounds. No stridor. No wheezing, rhonchi or rales.  Musculoskeletal:        General: Normal range of motion.     Cervical back: Normal range of motion.     Left knee: Effusion present. No swelling, deformity, erythema, ecchymosis, lacerations or bony tenderness. Normal range of motion. Tenderness present over the medial joint line. No lateral joint line, MCL, LCL, ACL, PCL or patellar tendon tenderness.     Instability Tests: Anterior drawer test negative. Posterior drawer test negative. Anterior Lachman test negative. Medial McMurray test negative and lateral McMurray test negative.     Left foot: Normal range of motion.  Swelling present. No deformity, laceration, tenderness or bony tenderness.  Skin:    General: Skin is warm and dry.     Capillary Refill: Capillary refill takes less than 2 seconds.  Neurological:     General: No focal deficit present.     Mental Status: She is alert and oriented to person, place, and time. Mental status is at baseline.  Psychiatric:        Mood and Affect: Mood normal.        Behavior: Behavior normal.        Thought Content: Thought  content normal.        Judgment: Judgment normal.    Lab Results  Component Value Date   TSH 4.520 (H) 02/16/2021   Lab Results  Component Value Date   WBC 4.0 07/28/2021   HGB 9.2 (L) 07/28/2021   HCT 30.9 (L) 07/28/2021   MCV 96.3 07/28/2021   PLT 228 07/28/2021   Lab Results  Component Value Date   NA 142 07/28/2021   K 2.8 (L) 07/28/2021   CO2 31 07/28/2021   GLUCOSE 155 (H) 07/28/2021   BUN 9 07/28/2021   CREATININE 0.73 07/28/2021   BILITOT 0.2 (L) 07/28/2021   ALKPHOS 106 07/28/2021   AST 11 (L) 07/28/2021   ALT 10 07/28/2021   PROT 6.1 (L) 07/28/2021   ALBUMIN 3.2 (L) 07/28/2021   CALCIUM 8.7 (L) 07/28/2021   ANIONGAP 6 07/28/2021   EGFR 62 02/16/2021   Lab Results  Component Value Date   CHOL 201 (H) 11/13/2020   Lab Results  Component Value Date   HDL 82 11/13/2020   Lab Results  Component Value Date   LDLCALC 96 11/13/2020   Lab Results  Component Value Date   TRIG 136 11/13/2020   Lab Results  Component Value Date   CHOLHDL 2.5 11/13/2020   Lab Results  Component Value Date   HGBA1C 6.3 02/16/2021

## 2021-08-07 LAB — CMP14+EGFR
ALT: 5 IU/L (ref 0–32)
AST: 12 IU/L (ref 0–40)
Albumin/Globulin Ratio: 1.3 (ref 1.2–2.2)
Albumin: 3.8 g/dL (ref 3.8–4.8)
Alkaline Phosphatase: 133 IU/L — ABNORMAL HIGH (ref 44–121)
BUN/Creatinine Ratio: 7 — ABNORMAL LOW (ref 12–28)
BUN: 6 mg/dL — ABNORMAL LOW (ref 8–27)
Bilirubin Total: 0.2 mg/dL (ref 0.0–1.2)
CO2: 26 mmol/L (ref 20–29)
Calcium: 8.9 mg/dL (ref 8.7–10.3)
Chloride: 99 mmol/L (ref 96–106)
Creatinine, Ser: 0.87 mg/dL (ref 0.57–1.00)
Globulin, Total: 3 g/dL (ref 1.5–4.5)
Glucose: 98 mg/dL (ref 70–99)
Potassium: 3.9 mmol/L (ref 3.5–5.2)
Sodium: 138 mmol/L (ref 134–144)
Total Protein: 6.8 g/dL (ref 6.0–8.5)
eGFR: 74 mL/min/{1.73_m2} (ref >59)

## 2021-08-07 LAB — T4, FREE: Free T4: 0.88 ng/dL (ref 0.82–1.77)

## 2021-08-07 LAB — TSH: TSH: 27.9 u[IU]/mL — ABNORMAL HIGH (ref 0.450–4.500)

## 2021-08-08 ENCOUNTER — Other Ambulatory Visit: Payer: Self-pay | Admitting: Family Medicine

## 2021-08-08 ENCOUNTER — Encounter: Payer: Self-pay | Admitting: Family Medicine

## 2021-08-08 DIAGNOSIS — E039 Hypothyroidism, unspecified: Secondary | ICD-10-CM

## 2021-08-08 MED ORDER — LEVOTHYROXINE SODIUM 112 MCG PO TABS: 112.0000 ug | ORAL_TABLET | Freq: Every morning | ORAL | 2 refills | Status: DC

## 2021-08-10 ENCOUNTER — Other Ambulatory Visit: Payer: Self-pay

## 2021-08-10 ENCOUNTER — Ambulatory Visit: Payer: 59 | Attending: Physical Medicine and Rehabilitation

## 2021-08-10 DIAGNOSIS — R293 Abnormal posture: Secondary | ICD-10-CM | POA: Insufficient documentation

## 2021-08-10 DIAGNOSIS — M5416 Radiculopathy, lumbar region: Secondary | ICD-10-CM | POA: Insufficient documentation

## 2021-08-10 DIAGNOSIS — M5441 Lumbago with sciatica, right side: Secondary | ICD-10-CM | POA: Insufficient documentation

## 2021-08-10 DIAGNOSIS — G8929 Other chronic pain: Secondary | ICD-10-CM | POA: Diagnosis present

## 2021-08-10 NOTE — Therapy (Signed)
[facility] [place], [place], [address] Phone: [phone]   Fax:  [phone]  Physical Therapy Evaluation  Patient Details  Name: Erica Beasley MRN: [id number] Date of Birth: 1956/03/29 Referring Provider (PT): Barnet Pall, NP   Encounter Date: 08/10/2021   PT End of Session - 08/10/21 1351     Visit Number 1    Number of Visits 10    Date for PT Re-Evaluation 10/15/21    PT Start Time 1351    PT Stop Time 1421    PT Time Calculation (min) 30 min    Activity Tolerance Patient tolerated treatment well    Behavior During Therapy [facility] for tasks assessed/performed             Past Medical History:  Diagnosis Date   Aortic atherosclerosis ([place]) 11/13/2020   CKD (chronic kidney disease) stage 2, GFR 60-89 ml/min    COPD (chronic obstructive pulmonary disease) ([place])    COVID-19 06/2019   Depression    Essential hypertension    GERD (gastroesophageal reflux disease)    History of diabetes mellitus    Hyperlipidemia    Hypothyroidism    Neuropathy    Rheumatoid arthritis ([place])    Vitamin B12 deficiency    Vitamin D deficiency     Past Surgical History:  Procedure Laterality Date   BIOPSY  09/16/2019   Procedure: BIOPSY;  Surgeon: Daneil Dolin, MD;  [facility];  Service: Endoscopy;;   BIOPSY  04/28/2020   Procedure: BIOPSY;  Surgeon: Daneil Dolin, MD;  [facility];  Service: Endoscopy;;   BIOPSY  06/08/2020   Procedure: BIOPSY;  Surgeon: Daneil Dolin, MD;  [facility];  Service: Endoscopy;;   CATARACT EXTRACTION Bilateral    COLONOSCOPY  09/2010   Dr. Posey Pronto: mild diverticulsis in sigmoid colon.    COLONOSCOPY WITH PROPOFOL N/A 09/16/2019   Rourk: Diverticulosis, random colon biopsies negative for microscopic colitis.   COLONOSCOPY WITH PROPOFOL N/A 06/08/2020   Procedure: COLONOSCOPY WITH PROPOFOL;  Surgeon: Daneil Dolin, MD;  [facility];  Service:  Endoscopy;  Laterality: N/A;  9:15am   ESOPHAGOGASTRODUODENOSCOPY (EGD) WITH PROPOFOL N/A 04/28/2020   Procedure: ESOPHAGOGASTRODUODENOSCOPY (EGD) WITH PROPOFOL;  Surgeon: Daneil Dolin, MD;  [facility];  Service: Endoscopy;  Laterality: N/A;  3:71   YAG LASER APPLICATION Left 69/67/8938   Procedure: YAG LASER APPLICATION;  Surgeon: Williams Che, MD;  Location: [facility];  Service: Ophthalmology;  Laterality: Left;    There were no vitals filed for this visit.    Subjective Assessment - 08/10/21 1352     Subjective Patient reports that her back has been hurting about 3-4 months. She reports no MOI to explain the beginning of her low back pain. She notes that she has had about 2 injections already and is scheduled for a third. She notes that these help, but they only last about a month. She feels like her back pain has been staying "about the same" in recent weeks. She notes that some days are worse than others. She also notes that her left knee and foot are sore and swollen in the morning    Pertinent History RA, HTN, COPD    Limitations Walking    How long can you stand comfortably? about 10 minutes    How long can you walk comfortably? uncertain (about to her mailbox and back)    Currently in Pain?  Yes    Pain Score 5     Pain Location Back    Pain Orientation Right;Lower    Pain Descriptors / Indicators Aching    Pain Type Chronic pain    Pain Radiating Towards posterior right lower extremity to the knee    Pain Onset More than a month ago    Pain Frequency Constant    Aggravating Factors  "nothing"    Pain Relieving Factors "nothing"                [facility] PT Assessment - 08/10/21 0001       Assessment   Medical Diagnosis Right lumbar radiculopathy    Referring Provider (PT) Barnet Pall, NP    Onset Date/Surgical Date --   3-4 months ago   Next MD Visit 08/17/21    Prior Therapy No      Precautions   Precautions None      Restrictions   Weight  Bearing Restrictions No      Balance Screen   Has the patient fallen in the past 6 months No    Has the patient had a decrease in activity level because of a fear of falling?  No    Is the patient reluctant to leave their home because of a fear of falling?  No      Home Environment   Living Environment Private residence    [place] to enter    Entrance Stairs-Number of Steps 1-4    Entrance Stairs-Rails None    Home Layout One level      Prior Function   Level of Independence Independent    [place]      Cognition   Overall Cognitive Status Within Functional Limits for tasks assessed    Attention Alternating    Memory Appears intact      Sensation   Additional Comments Patient reports no numbness or tingling      Posture/Postural Control   Posture/Postural Control Postural limitations    Postural Limitations Rounded Shoulders;Forward head;Decreased lumbar lordosis;Flexed trunk      ROM / Strength   AROM / PROM / Strength Strength;AROM      AROM   AROM Assessment Site Lumbar    Lumbar Flexion 50% limited    Lumbar Extension to neutral   unable to achieve extension   Lumbar - Right Side Bend 75% limited    Lumbar - Left Side Bend 75% limited    Lumbar - Right Rotation 50% limited    Lumbar - Left Rotation 50% limited      Strength   Strength Assessment Site Hip;Knee;Ankle    Right/Left Hip Right;Left    Right Hip Flexion 3/5   low back pain   Left Hip Flexion 3/5   low back pain   Right/Left Knee Right;Left    Right Knee Flexion 3/5    Right Knee Extension 3/5   knee pain   Left Knee Flexion 3/5    Left Knee Extension 3/5   knee pain   Right/Left Ankle Right;Left    Right Ankle Dorsiflexion 4-/5    Left Ankle Dorsiflexion 4/5      Palpation   Spinal mobility Lumbar spine: globally hypomobile and nonpainful    Palpation comment TTP: left lumbar paraspinals, gluteals and piriformis   Piriformis and gluteals  recreate radiating pain     Bed Mobility   Bed Mobility Left Sidelying to Sit   Patient  transfered from standing to left sidelying by transitioning to quadriped prior to left sidelying   Left Sidelying to Sit Independent   significant difficulty; >5 seconds to complete     Transfers   Transfers Sit to Stand;Stand to Sit    Sit to Stand 6: Modified independent (Device/Increase time);Multiple attempts    Five time sit to stand comments  22 seconds    Stand to Sit 6: Modified independent (Device/Increase time);Uncontrolled descent                        Objective measurements completed on examination: See above findings.                     PT Long Term Goals - 08/10/21 1441       PT LONG TERM GOAL #1   Title Patient will be independent with her HEP.    Time 5    Period Weeks    Status New    Target Date 09/14/21      PT LONG TERM GOAL #2   Title Patient will be able to stand for at least 20 minutes without being limited by her familiar low back pain.    Baseline 10 minutes    Time 5    Period Weeks    Status New    Target Date 09/14/21      PT LONG TERM GOAL #3   Title Patient will be able to complete a five time sit to stand test in 12 seconds or less.    Time 5    Period Weeks    Status New    Target Date 09/14/21                    Plan - 08/10/21 1430     Clinical Impression Statement Patient is a 65 year old female presenting to physical therapy with right low back pain with pain radiating down her right lower extremity to her knee with no known cause. Her familiar pain was reproduced with palpation to her right gluteals and piriformis. Recommend that she continue with her recommended plan of care to address her impairments to return to her prior level of function.    Personal Factors and Comorbidities Other;Comorbidity 1;Comorbidity 2;Comorbidity 3+    Comorbidities HTN, RA, hypothyroidism, COPD,    Examination-Activity  Limitations Locomotion Level;Other;Stand;Bed Mobility;Transfers    Examination-Participation Restrictions Other;Shop    Stability/Clinical Decision Making Evolving/Moderate complexity    Clinical Decision Making Moderate    Rehab Potential Fair    PT Frequency 2x / week    PT Duration Other (comment)   5 weeks   PT Treatment/Interventions ADLs/Self Care Home Management;Cryotherapy;Electrical Stimulation;Ultrasound;Moist Heat;Functional mobility training;Therapeutic activities;Therapeutic exercise;Manual techniques;Patient/family education;Passive range of motion;Dry needling;Gait training;Balance training;Neuromuscular re-education;Energy conservation    PT Next Visit Plan SKTC, hip bridges, Nustep, STW/M and modalites as needed.  Core exercise progression.    Consulted and Agree with Plan of Care Patient             Patient will benefit from skilled therapeutic intervention in order to improve the following deficits and impairments:  Abnormal gait, Decreased activity tolerance, Decreased mobility, Increased muscle spasms, Postural dysfunction, Pain, Decreased range of motion, Decreased balance, Decreased strength  Visit Diagnosis: Chronic right-sided low back pain with right-sided sciatica     Problem List Patient Active Problem List   Diagnosis Date Noted   Hypothyroidism 11/13/2020   Prediabetes 11/13/2020  B12 deficiency 11/13/2020   COPD without exacerbation ([place]) 11/13/2020   Vitamin D deficiency 11/13/2020   Neuropathy 11/13/2020   Rheumatoid arthritis involving multiple sites ([place]) 11/13/2020   Mixed hyperlipidemia 11/13/2020   Aortic atherosclerosis ([place]) 11/13/2020   GERD (gastroesophageal reflux disease) 11/05/2020   Depression, recurrent ([place]) 09/22/2020   Essential hypertension 09/22/2020   Dehydration 08/13/2020   Hypokalemia 08/13/2020   Abnormal CT of the abdomen 06/02/2020   N&V (nausea and vomiting) 04/08/2020   Monoclonal gammopathy 04/03/2020    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

## 2021-08-16 ENCOUNTER — Other Ambulatory Visit: Payer: Self-pay | Admitting: Family Medicine

## 2021-08-16 DIAGNOSIS — I700 Atherosclerosis of aorta: Secondary | ICD-10-CM

## 2021-08-16 DIAGNOSIS — E782 Mixed hyperlipidemia: Secondary | ICD-10-CM

## 2021-08-16 DIAGNOSIS — E1165 Type 2 diabetes mellitus with hyperglycemia: Secondary | ICD-10-CM

## 2021-08-17 ENCOUNTER — Other Ambulatory Visit: Payer: Self-pay

## 2021-08-17 ENCOUNTER — Ambulatory Visit: Payer: Self-pay

## 2021-08-17 ENCOUNTER — Encounter: Payer: Self-pay | Admitting: Physical Medicine and Rehabilitation

## 2021-08-17 ENCOUNTER — Ambulatory Visit (INDEPENDENT_AMBULATORY_CARE_PROVIDER_SITE_OTHER): Payer: 59 | Admitting: Physical Medicine and Rehabilitation

## 2021-08-17 VITALS — BP 125/72 | HR 77

## 2021-08-17 DIAGNOSIS — M5116 Intervertebral disc disorders with radiculopathy, lumbar region: Secondary | ICD-10-CM

## 2021-08-17 DIAGNOSIS — M47819 Spondylosis without myelopathy or radiculopathy, site unspecified: Secondary | ICD-10-CM

## 2021-08-17 DIAGNOSIS — M5416 Radiculopathy, lumbar region: Secondary | ICD-10-CM

## 2021-08-17 DIAGNOSIS — M48061 Spinal stenosis, lumbar region without neurogenic claudication: Secondary | ICD-10-CM

## 2021-08-17 MED ORDER — BETAMETHASONE SOD PHOS & ACET 6 (3-3) MG/ML IJ SUSP
12.0000 mg | Freq: Once | INTRAMUSCULAR | Status: AC
  Administered 2021-08-17: 12 mg

## 2021-08-17 NOTE — Progress Notes (Signed)
Pt state lower back pain that travels down her right leg. Pt state walking makes the pain worse. Pt state she takes over the counter pain.  Numeric Pain Rating Scale and Functional Assessment Average Pain 5   In the last MONTH (on 0-10 scale) has pain interfered with the following?  1. General activity like being  able to carry out your everyday physical activities such as walking, climbing stairs, carrying groceries, or moving a chair?  Rating(9)   +Driver, -BT, -Dye Allergies.

## 2021-08-17 NOTE — Patient Instructions (Signed)

## 2021-08-25 ENCOUNTER — Ambulatory Visit: Payer: 59

## 2021-08-25 ENCOUNTER — Other Ambulatory Visit: Payer: Self-pay

## 2021-08-25 DIAGNOSIS — M5441 Lumbago with sciatica, right side: Secondary | ICD-10-CM

## 2021-08-25 DIAGNOSIS — R293 Abnormal posture: Secondary | ICD-10-CM

## 2021-08-25 DIAGNOSIS — G8929 Other chronic pain: Secondary | ICD-10-CM

## 2021-08-25 NOTE — Therapy (Signed)
[facility] [place], [place], [address] Phone: [phone]   Fax:  [phone]  Physical Therapy Treatment  Patient Details  Name: Erica Beasley MRN: [id number] Date of Birth: 1956-03-26 Referring Provider (PT): Barnet Pall, NP   Encounter Date: 08/25/2021   PT End of Session - 08/25/21 1125     Visit Number 2    Number of Visits 10    Date for PT Re-Evaluation 10/15/21    PT Start Time 1115    PT Stop Time 1200    PT Time Calculation (min) 45 min    Activity Tolerance Patient tolerated treatment well    Behavior During Therapy [facility] for tasks assessed/performed             Past Medical History:  Diagnosis Date   Aortic atherosclerosis ([place]) 11/13/2020   CKD (chronic kidney disease) stage 2, GFR 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/min    COPD (chronic obstructive pulmonary disease) ([place])    COVID-19 06/2019   Depression    Essential hypertension    GERD (gastroesophageal reflux disease)    History of diabetes mellitus    Hyperlipidemia    Hypothyroidism    Neuropathy    Rheumatoid arthritis ([place])    Vitamin B12 deficiency    Vitamin D deficiency     Past Surgical History:  Procedure Laterality Date   BIOPSY  09/16/2019   Procedure: BIOPSY;  Surgeon: Daneil Dolin, MD;  [facility];  Service: Endoscopy;;   BIOPSY  04/28/2020   Procedure: BIOPSY;  Surgeon: Daneil Dolin, MD;  [facility];  Service: Endoscopy;;   BIOPSY  06/08/2020   Procedure: BIOPSY;  Surgeon: Daneil Dolin, MD;  [facility];  Service: Endoscopy;;   CATARACT EXTRACTION Bilateral    COLONOSCOPY  09/2010   Dr. Posey Pronto: mild diverticulsis in sigmoid colon.    COLONOSCOPY WITH PROPOFOL N/A 09/16/2019   Rourk: Diverticulosis, random colon biopsies negative for microscopic colitis.   COLONOSCOPY WITH PROPOFOL N/A 06/08/2020   Procedure: COLONOSCOPY WITH PROPOFOL;  Surgeon: Daneil Dolin, MD;  [facility];  Service:  Endoscopy;  Laterality: N/A;  9:15am   ESOPHAGOGASTRODUODENOSCOPY (EGD) WITH PROPOFOL N/A 04/28/2020   Procedure: ESOPHAGOGASTRODUODENOSCOPY (EGD) WITH PROPOFOL;  Surgeon: Daneil Dolin, MD;  [facility];  Service: Endoscopy;  Laterality: N/A;  4:31   YAG LASER APPLICATION Left 54/00/8676   Procedure: YAG LASER APPLICATION;  Surgeon: Williams Che, MD;  Location: [facility];  Service: Ophthalmology;  Laterality: Left;    There were no vitals filed for this visit.   Subjective Assessment - 08/25/21 1124     Subjective Pt arrives for today's treatment session reporting 3/10 low back pain.    Pertinent History RA, HTN, COPD    Limitations Walking    How long can you stand comfortably? about 10 minutes    How long can you walk comfortably? uncertain (about to her mailbox and back)    Currently in Pain? Yes    Pain Score 3     Pain Location Back    Pain Orientation Right;Lower    Pain Onset More than a month ago                               [facility] Adult PT Treatment/Exercise - 08/25/21 0001       Exercises   Exercises Lumbar  Lumbar Exercises: Stretches   Single Knee to Chest Stretch Right;Left   10 reps with 5 sec   Lower Trunk Rotation Other (comment)   10 reps with 5 sec hold   Piriformis Stretch Right;Left   10 reps with 5 sec     Lumbar Exercises: Aerobic   Nustep Lvl 3 x 15 mins      Modalities   Modalities Electrical Stimulation      Electrical Stimulation   Electrical Stimulation Location Bilateral low back    Electrical Stimulation Action IFC at 80-150 Hz    Electrical Stimulation Parameters 40% scan x 15 mins    Electrical Stimulation Goals Pain;Tone                          PT Long Term Goals - 08/10/21 1441       PT LONG TERM GOAL #1   Title Patient will be independent with her HEP.    Time 5    Period Weeks    Status New    Target Date 09/14/21      PT LONG TERM GOAL #2   Title Patient will be able  to stand for at least 20 minutes without being limited by her familiar low back pain.    Baseline 10 minutes    Time 5    Period Weeks    Status New    Target Date 09/14/21      PT LONG TERM GOAL #3   Title Patient will be able to complete a five time sit to stand test in 12 seconds or less.    Time 5    Period Weeks    Status New    Target Date 09/14/21                   Plan - 08/25/21 1125     Clinical Impression Statement Pt arrives for today's treament session reporting 3/10 low back pain.  Pt able to tolerate Nustep for warmup without issue.  Pt instructed in supine lumbar stretches with pt requiring cues for proper technique and hold.  Pt instructed to perform stretches and given printed HEP.  Normal reactions to estim noted at completion of treatment session.  Pt reported 1/10 low back pain at completion of today's treatment session.    Personal Factors and Comorbidities Other;Comorbidity 1;Comorbidity 2;Comorbidity 3+    Comorbidities HTN, RA, hypothyroidism, COPD,    Examination-Activity Limitations Locomotion Level;Other;Stand;Bed Mobility;Transfers    Examination-Participation Restrictions Other;Shop    Stability/Clinical Decision Making Evolving/Moderate complexity    Rehab Potential Fair    PT Frequency 2x / week    PT Duration Other (comment)   5 weeks   PT Treatment/Interventions ADLs/Self Care Home Management;Cryotherapy;Electrical Stimulation;Ultrasound;Moist Heat;Functional mobility training;Therapeutic activities;Therapeutic exercise;Manual techniques;Patient/family education;Passive range of motion;Dry needling;Gait training;Balance training;Neuromuscular re-education;Energy conservation    PT Next Visit Plan SKTC, hip bridges, Nustep, STW/M and modalites as needed.  Core exercise progression.    Consulted and Agree with Plan of Care Patient             Patient will benefit from skilled therapeutic intervention in order to improve the following  deficits and impairments:  Abnormal gait, Decreased activity tolerance, Decreased mobility, Increased muscle spasms, Postural dysfunction, Pain, Decreased range of motion, Decreased balance, Decreased strength  Visit Diagnosis: Chronic right-sided low back pain with right-sided sciatica  Acute right-sided low back pain with right-sided sciatica  Abnormal posture  Problem List Patient Active Problem List   Diagnosis Date Noted   Hypothyroidism 11/13/2020   Prediabetes 11/13/2020   B12 deficiency 11/13/2020   COPD without exacerbation ([place]) 11/13/2020   Vitamin D deficiency 11/13/2020   Neuropathy 11/13/2020   Rheumatoid arthritis involving multiple sites ([place]) 11/13/2020   Mixed hyperlipidemia 11/13/2020   Aortic atherosclerosis ([place]) 11/13/2020   GERD (gastroesophageal reflux disease) 11/05/2020   Depression, recurrent ([place]) 09/22/2020   Essential hypertension 09/22/2020   Dehydration 08/13/2020   Hypokalemia 08/13/2020   Abnormal CT of the abdomen 06/02/2020   N&V (nausea and vomiting) 04/08/2020   Monoclonal gammopathy 04/03/2020   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 03/26/2020   Hx of colonic polyps 05/03/2019   H/O Clostridium difficile infection 05/03/2019   Normocytic anemia 05/03/2019   Chronic diarrhea 01/29/2019    Kathrynn Ducking, PTA 08/25/2021, 12:03 PM  [facility] [address] [place], [place], [address] Phone: [phone]   Fax:  [phone]  Name: Erica Beasley MRN: [id number] Date of Birth: August 29, 1956

## 2021-08-30 ENCOUNTER — Ambulatory Visit: Payer: 59 | Attending: Physical Medicine and Rehabilitation

## 2021-08-30 ENCOUNTER — Other Ambulatory Visit: Payer: Self-pay

## 2021-08-30 ENCOUNTER — Other Ambulatory Visit: Payer: Self-pay | Admitting: Family Medicine

## 2021-08-30 DIAGNOSIS — G8929 Other chronic pain: Secondary | ICD-10-CM | POA: Insufficient documentation

## 2021-08-30 DIAGNOSIS — M5441 Lumbago with sciatica, right side: Secondary | ICD-10-CM | POA: Diagnosis not present

## 2021-08-30 DIAGNOSIS — G629 Polyneuropathy, unspecified: Secondary | ICD-10-CM

## 2021-08-30 NOTE — Therapy (Addendum)
[facility] [place], [place], [address] Phone: [phone]   Fax:  [phone]  Physical Therapy Treatment  Patient Details  Name: Erica Beasley MRN: [id number] Date of Birth: [birth year] Referring Provider (PT): Barnet Pall, NP   Encounter Date: 08/30/2021   PT End of Session - 08/30/21 1140     Visit Number 3    Number of Visits 10    Date for PT Re-Evaluation 10/15/21    PT Start Time 1115    PT Stop Time 1158    PT Time Calculation (min) 43 min    Activity Tolerance Patient tolerated treatment well    Behavior During Therapy [facility] for tasks assessed/performed             Past Medical History:  Diagnosis Date   Aortic atherosclerosis ([place]) 11/13/2020   CKD (chronic kidney disease) stage 2, GFR 60-89 ml/min    COPD (chronic obstructive pulmonary disease) ([place])    COVID-19 06/2019   Depression    Essential hypertension    GERD (gastroesophageal reflux disease)    History of diabetes mellitus    Hyperlipidemia    Hypothyroidism    Neuropathy    Rheumatoid arthritis ([place])    Vitamin B12 deficiency    Vitamin D deficiency     Past Surgical History:  Procedure Laterality Date   BIOPSY  09/16/2019   Procedure: BIOPSY;  Surgeon: Daneil Dolin, MD;  [facility];  Service: Endoscopy;;   BIOPSY  04/28/2020   Procedure: BIOPSY;  Surgeon: Daneil Dolin, MD;  [facility];  Service: Endoscopy;;   BIOPSY  06/08/2020   Procedure: BIOPSY;  Surgeon: Daneil Dolin, MD;  [facility];  Service: Endoscopy;;   CATARACT EXTRACTION Bilateral    COLONOSCOPY  09/2010   Dr. Posey Pronto: mild diverticulsis in sigmoid colon.    COLONOSCOPY WITH PROPOFOL N/A 09/16/2019   Rourk: Diverticulosis, random colon biopsies negative for microscopic colitis.   COLONOSCOPY WITH PROPOFOL N/A 06/08/2020   Procedure: COLONOSCOPY WITH PROPOFOL;  Surgeon: Daneil Dolin, MD;  [facility];  Service:  Endoscopy;  Laterality: N/A;  9:15am   ESOPHAGOGASTRODUODENOSCOPY (EGD) WITH PROPOFOL N/A 04/28/2020   Procedure: ESOPHAGOGASTRODUODENOSCOPY (EGD) WITH PROPOFOL;  Surgeon: Daneil Dolin, MD;  [facility];  Service: Endoscopy;  Laterality: N/A;  4:58   YAG LASER APPLICATION Left 09/98/3382   Procedure: YAG LASER APPLICATION;  Surgeon: Williams Che, MD;  Location: [facility];  Service: Ophthalmology;  Laterality: Left;    There were no vitals filed for this visit.   Subjective Assessment - 08/30/21 1116     Subjective Patient reports that she felt good after her last appointment. However, she reported that her right leg started hurting last night with no reason.    Pertinent History RA, HTN, COPD    Limitations Walking    How long can you stand comfortably? about 10 minutes    How long can you walk comfortably? uncertain (about to her mailbox and back)    Currently in Pain? Yes    Pain Score 6     Pain Location Back    Pain Orientation Right    Pain Descriptors / Indicators Sore    Pain Type Chronic pain    Pain Radiating Towards down RLE    Pain Onset More than a month ago    Pain Frequency Constant  [place] [facility] PT Treatment/Exercise - 08/30/21 0001       Lumbar Exercises: Stretches   Passive Hamstring Stretch Right;4 reps;30 seconds    Gastroc Stretch Right;4 reps;30 seconds      Lumbar Exercises: Aerobic   Nustep L3 x 10 minutes      Lumbar Exercises: Supine   Bridge Non-compliant;20 reps      Lumbar Exercises: Sidelying   Clam Right;20 reps      Manual Therapy   Manual Therapy Soft tissue mobilization;Joint mobilization    Joint Mobilization Grade I-III lumbar CPA's    Soft tissue mobilization right gluteals and piriformis                          PT Long Term Goals - 08/10/21 1441       PT LONG TERM GOAL #1   Title Patient will be independent with her HEP.    Time 5    Period Weeks     Status New    Target Date 09/14/21      PT LONG TERM GOAL #2   Title Patient will be able to stand for at least 20 minutes without being limited by her familiar low back pain.    Baseline 10 minutes    Time 5    Period Weeks    Status New    Target Date 09/14/21      PT LONG TERM GOAL #3   Title Patient will be able to complete a five time sit to stand test in 12 seconds or less.    Time 5    Period Weeks    Status New    Target Date 09/14/21                   Plan - 08/30/21 1140     Clinical Impression Statement Patient presented to treatment with increased right lower extremity pain with no known cause. Manual therapy was utilized for reduced pain with soft tissue mobilization to the right piriformis being the most effective. This was followed by appropriately matched interventions for gluteal and piriformis engagent. She experienced no increase in her familiar pain or discomfort with any of today's interventions. She required moderate cuing with bridging to promote and maintain proper biomechanics. She reported feeling better upon the conclusion of treatment. She continues to require skilled physical therapy to address her remaining impairments to return to her prior level of function.    Personal Factors and Comorbidities Other;Comorbidity 1;Comorbidity 2;Comorbidity 3+    Comorbidities HTN, RA, hypothyroidism, COPD,    Examination-Activity Limitations Locomotion Level;Other;Stand;Bed Mobility;Transfers    Examination-Participation Restrictions Other;Shop    Stability/Clinical Decision Making Evolving/Moderate complexity    Rehab Potential Fair    PT Frequency 2x / week    PT Duration Other (comment)   5 weeks   PT Treatment/Interventions ADLs/Self Care Home Management;Cryotherapy;Electrical Stimulation;Ultrasound;Moist Heat;Functional mobility training;Therapeutic activities;Therapeutic exercise;Manual techniques;Patient/family education;Passive range of motion;Dry  needling;Gait training;Balance training;Neuromuscular re-education;Energy conservation    PT Next Visit Plan SKTC, hip bridges, Nustep, STW/M and modalites as needed.  Core exercise progression.    Consulted and Agree with Plan of Care Patient             Patient will benefit from skilled therapeutic intervention in order to improve the following deficits and impairments:  Abnormal gait, Decreased activity tolerance, Decreased mobility, Increased muscle spasms, Postural dysfunction, Pain, Decreased range of motion, Decreased balance, Decreased strength  Visit Diagnosis: Chronic  right-sided low back pain with right-sided sciatica     Problem List Patient Active Problem List   Diagnosis Date Noted   Hypothyroidism 11/13/2020   Prediabetes 11/13/2020   B12 deficiency 11/13/2020   COPD without exacerbation ([place]) 11/13/2020   Vitamin D deficiency 11/13/2020   Neuropathy 11/13/2020   Rheumatoid arthritis involving multiple sites ([place]) 11/13/2020   Mixed hyperlipidemia 11/13/2020   Aortic atherosclerosis ([place]) 11/13/2020   GERD (gastroesophageal reflux disease) 11/05/2020   Depression, recurrent ([place]) 09/22/2020   Essential hypertension 09/22/2020   Dehydration 08/13/2020   Hypokalemia 08/13/2020   Abnormal CT of the abdomen 06/02/2020   N&V (nausea and vomiting) 04/08/2020   Monoclonal gammopathy 04/03/2020   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 [place], [place], [address] Phone: [phone]   Fax:  [phone]  Name: Erica Beasley MRN: [id number] Date of Birth: [birth year]  PHYSICAL THERAPY DISCHARGE SUMMARY  Visits from Start of Care: 3  Current functional level related to goals /  functional outcomes: Patient is being discharged at this time as she has not attended physical therapy since her last appointment on 08/30/21.   Remaining deficits: Low back and right lower extremity pain   Education / Equipment: HEP    Patient agrees to discharge. Patient goals were not met. Patient is being discharged due to not returning since the last visit.

## 2021-08-31 ENCOUNTER — Encounter: Payer: Self-pay | Admitting: Physical Medicine and Rehabilitation

## 2021-08-31 NOTE — Telephone Encounter (Signed)
Called and scheduled her to see dr. Erlinda Hong on Thursday

## 2021-09-02 ENCOUNTER — Other Ambulatory Visit: Payer: Self-pay

## 2021-09-02 ENCOUNTER — Ambulatory Visit (INDEPENDENT_AMBULATORY_CARE_PROVIDER_SITE_OTHER): Payer: 59

## 2021-09-02 ENCOUNTER — Telehealth: Payer: Self-pay

## 2021-09-02 ENCOUNTER — Encounter: Payer: Self-pay | Admitting: Orthopaedic Surgery

## 2021-09-02 ENCOUNTER — Ambulatory Visit (HOSPITAL_COMMUNITY)
Admission: RE | Admit: 2021-09-02 | Discharge: 2021-09-02 | Disposition: A | Discharge status: Home / Self Care | Payer: 59 | Source: Ambulatory Visit | Attending: Orthopaedic Surgery | Admitting: Orthopaedic Surgery

## 2021-09-02 ENCOUNTER — Ambulatory Visit (INDEPENDENT_AMBULATORY_CARE_PROVIDER_SITE_OTHER): Payer: 59 | Admitting: Orthopaedic Surgery

## 2021-09-02 DIAGNOSIS — M25561 Pain in right knee: Secondary | ICD-10-CM

## 2021-09-02 NOTE — Telephone Encounter (Signed)
Erica Beasley with [facility] wanted to let Dr. Erlinda Hong know that patient is Negative for DVT, right LE.  Please advise.  Thank you.

## 2021-09-02 NOTE — Progress Notes (Signed)
Office Visit Note   Patient: Erica Beasley           Date of Birth: 1955-11-27           MRN: [id number] Visit Date: 09/02/2021              Requested by: Loman Brooklyn, [place],  [place] [address] PCP: Loman Brooklyn, FNP   Assessment & Plan: Visit Diagnoses:  1. Acute pain of right knee     Plan: Impression is right leg pain concerning for DVT in addition to underlying lumbar radiculopathy.  I do not feel that her symptoms are currently coming from her knee even though her x-rays show advanced degenerative changes.  I would like to first obtain a right lower extremity Doppler ultrasound to rule out DVT.  If this is negative, she will follow-up with Dr. Ernestina Patches for further evaluation and possible repeat ESI.  Call with concerns or questions in the meantime.  Follow-Up Instructions: Return if symptoms worsen or fail to improve.   Orders:  Orders Placed This Encounter  Procedures   XR KNEE 3 VIEW RIGHT   VAS [place] LOWER EXTREMITY VENOUS (DVT)   No orders of the defined types were placed in this encounter.     Procedures: No procedures performed   Clinical Data: No additional findings.   Subjective: Chief Complaint  Patient presents with   Right Knee - Pain    HPI patient is a pleasant 65 year old female who comes in today with right lower leg pain for the past 5 days.  No known injury or change in activity.  S the pain is primarily to the calf and radiates to the popliteal fossa and occasionally to the lateral thigh.  Pain is worse with walking as well as lying down.  She denies any mechanical symptoms.  She has been taking ibuprofen as well as daily prednisone for which she gets her underlying RA.  She has recently been seen by Dr. Ernestina Patches for epidural steroid injections which have provided temporary relief.  She is currently denying any chest pain or shortness of breath.  She is a non-smoker and oral contraceptives.  No personal or family history of  DVT/PE.  She is prescribed baby aspirin but does not take this.  Review of Systems as detailed in HPI.  All others reviewed and are negative.   Objective: Vital Signs: There were no vitals taken for this visit.  Physical Exam well-developed well-nourished female no acute distress.  Alert and oriented x3.  Ortho Exam right lower extremity reveals no effusion to the right knee.  No joint line tenderness.  Ligaments are stable.  Range of motion 0 to 95 degrees.  She does have moderate tenderness to the calf and in the popliteal fossa with a positive straight leg raise.  No swelling or skin changes to right lower extremity.  She does have pain with [place].  She is neurovascular intact distally.  Specialty Comments:  No specialty comments available.  Imaging: XR KNEE 3 VIEW RIGHT  Result Date: 09/02/2021 X-rays demonstrate moderate joint space narrowing medial compartment    PMFS History: Patient Active Problem List   Diagnosis Date Noted   Hypothyroidism 11/13/2020   Prediabetes 11/13/2020   B12 deficiency 11/13/2020   COPD without exacerbation ([place]) 11/13/2020   Vitamin D deficiency 11/13/2020   Neuropathy 11/13/2020   Rheumatoid arthritis involving multiple sites ([place]) 11/13/2020   Mixed hyperlipidemia 11/13/2020   Aortic atherosclerosis ([place])  11/13/2020   GERD (gastroesophageal reflux disease) 11/05/2020   Depression, recurrent ([place]) 09/22/2020   Essential hypertension 09/22/2020   Dehydration 08/13/2020   Hypokalemia 08/13/2020   Abnormal CT of the abdomen 06/02/2020   N&V (nausea and vomiting) 04/08/2020   Monoclonal gammopathy 04/03/2020   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 03/26/2020   Hx of colonic polyps 05/03/2019   H/O Clostridium difficile infection 05/03/2019   Normocytic anemia 05/03/2019   Chronic diarrhea 01/29/2019   Past Medical History:  Diagnosis Date   Aortic atherosclerosis ([place]) 11/13/2020   CKD (chronic kidney disease) stage 2,  GFR 60-89 ml/min    COPD (chronic obstructive pulmonary disease) ([place])    COVID-19 06/2019   Depression    Essential hypertension    GERD (gastroesophageal reflux disease)    History of diabetes mellitus    Hyperlipidemia    Hypothyroidism    Neuropathy    Rheumatoid arthritis ([facility])    Vitamin B12 deficiency    Vitamin D deficiency     Family History  Problem Relation Age of Onset   Stroke Mother    Heart disease Mother    Diabetes Mother    Breast cancer Mother    Other Father        stomach taken out for some reason   Migraines Daughter    Colon cancer Neg Hx     Past Surgical History:  Procedure Laterality Date   BIOPSY  09/16/2019   Procedure: BIOPSY;  Surgeon: Daneil Dolin, MD;  [facility];  Service: Endoscopy;;   BIOPSY  04/28/2020   Procedure: BIOPSY;  Surgeon: Daneil Dolin, MD;  [facility];  Service: Endoscopy;;   BIOPSY  06/08/2020   Procedure: BIOPSY;  Surgeon: Daneil Dolin, MD;  [facility];  Service: Endoscopy;;   CATARACT EXTRACTION Bilateral    COLONOSCOPY  09/2010   Dr. Posey Pronto: mild diverticulsis in sigmoid colon.    COLONOSCOPY WITH PROPOFOL N/A 09/16/2019   Rourk: Diverticulosis, random colon biopsies negative for microscopic colitis.   COLONOSCOPY WITH PROPOFOL N/A 06/08/2020   Procedure: COLONOSCOPY WITH PROPOFOL;  Surgeon: Daneil Dolin, MD;  [facility];  Service: Endoscopy;  Laterality: N/A;  9:15am   ESOPHAGOGASTRODUODENOSCOPY (EGD) WITH PROPOFOL N/A 04/28/2020   Procedure: ESOPHAGOGASTRODUODENOSCOPY (EGD) WITH PROPOFOL;  Surgeon: Daneil Dolin, MD;  [facility];  Service: Endoscopy;  Laterality: N/A;  4:94   YAG LASER APPLICATION Left 49/67/5916   Procedure: YAG LASER APPLICATION;  Surgeon: Williams Che, MD;  Location: [facility];  Service: Ophthalmology;  Laterality: Left;   Social History   Occupational History   Occupation: DISABLED  Tobacco Use   Smoking status: Former     Packs/day: 1.00    Years: 15.00    Pack years: 15.00    Types: Cigarettes    Quit date: 09/11/2017    Years since quitting: 3.9   Smokeless tobacco: Former  Scientific laboratory technician Use: Never used  Substance and Sexual Activity   Alcohol use: Never   Drug use: Never   Sexual activity: Not Currently

## 2021-09-03 ENCOUNTER — Encounter (HOSPITAL_COMMUNITY): Payer: Self-pay | Admitting: Hematology

## 2021-09-03 NOTE — Telephone Encounter (Signed)
Ok great.  Can you have her f/u back up with [facility] as her pain is coming from her back and not her knee?

## 2021-09-06 ENCOUNTER — Encounter: Payer: Self-pay | Admitting: Physical Medicine and Rehabilitation

## 2021-09-06 ENCOUNTER — Other Ambulatory Visit: Payer: Self-pay

## 2021-09-06 ENCOUNTER — Ambulatory Visit (INDEPENDENT_AMBULATORY_CARE_PROVIDER_SITE_OTHER): Payer: 59 | Admitting: Physical Medicine and Rehabilitation

## 2021-09-06 VITALS — BP 145/78 | HR 94

## 2021-09-06 DIAGNOSIS — M5116 Intervertebral disc disorders with radiculopathy, lumbar region: Secondary | ICD-10-CM

## 2021-09-06 DIAGNOSIS — M5416 Radiculopathy, lumbar region: Secondary | ICD-10-CM | POA: Diagnosis not present

## 2021-09-06 DIAGNOSIS — M48061 Spinal stenosis, lumbar region without neurogenic claudication: Secondary | ICD-10-CM

## 2021-09-06 DIAGNOSIS — M47819 Spondylosis without myelopathy or radiculopathy, site unspecified: Secondary | ICD-10-CM | POA: Diagnosis not present

## 2021-09-06 NOTE — Progress Notes (Signed)
Erica Beasley - 65 y.o. female MRN [id number]  Date of birth: 26-Feb-1956  Office Visit Note: Visit Date: 09/06/2021 PCP: Loman Brooklyn, FNP Referred by: Loman Brooklyn, FNP  Subjective: Chief Complaint  Patient presents with   Lower Back - Pain   Left Knee - Pain   Left Leg - Pain   HPI: Erica Beasley is a 65 y.o. female who comes in today for evaluation of chronic, worsening and severe left sided back pain radiating down leg. Patient reports pain has been ongoing for several months. Patient had right L4-L5 interlaminar epidural steroid injection on 08/17/2021 and states minimal relief of pain for 1 week. Patient was recently seen by Dr. Eduard Roux for right knee pain, patient had right lower extremity doppler performed which was negative for DVT. Today, patient states her pain is now on the left side and feels similar to the pain she has experienced in the past. Patient reports pain is exacerbated by walking and standing, currently describes pain as a soreness sensation, rates as 8 out of 10. Patient reports previous formal physical therapy at [facility] but reports she stopped attending due to insurance/financial issues. Patient also reports history of chiropractic treatments with Dr. Dwana Curd and states minimal pain relief with these treatments. Patient's recent lumbar MRI exhibits multi-level degenerative changes and right eccentric disc bulge with facet hypertrophy at L4-5 with resultant severe right and moderate left L4 foraminal stenosis. No high grade spinal canal stenosis noted. Patient states she would like to try another injection if all possible. Patient states she does not want to have back surgery and is not interested in surgical consultation at this time. Patient denies focal weakness, numbness and tingling. Patient denies recent trauma or falls.   Review of Systems  Musculoskeletal:  Positive for back pain.  Neurological:  Negative for  tingling, sensory change, focal weakness and weakness.  All other systems reviewed and are negative. Otherwise per HPI.  Assessment & Plan: Visit Diagnoses:    ICD-10-CM   1. Lumbar radiculopathy  M54.16     2. Intervertebral disc disorders with radiculopathy, lumbar region  M51.16     3. Foraminal stenosis of lumbar region  M48.061     4. Facet arthropathy  M47.819        Plan: Findings:  Chronic, worsening and severe left sided lower back pain pain radiating down to leg and knee. Patient continues to have excruciating and debilitating pain despite good conservative therapies such as formal chiropractic treatments, physical therapy, and medications. Patient's clinical presentation and exam are consistent with L4/L5 nerve pattern. We believe the next step is to perform a diagnostic and hopefully therapeutic left L4 transforaminal epidural steroid injection under fluoroscopic guidance. We did discuss plan of care with patient in detail today. At this point we have tried multiple lumbar epidural steroid injections with minimal lasting relief. We also spoke with patient about surgical consultation and at this time she voices this is not something she wants to do. We also spoke with patient about re-grouping with physical therapy, however she does not wish to do so at this time. No red flag symptoms noted upon exam today.    Meds & Orders: No orders of the defined types were placed in this encounter.  No orders of the defined types were placed in this encounter.   Follow-up: Return for Left L4 transforaminal epidural steroid injection.   Procedures: No procedures performed  Clinical History: EXAM: MRI LUMBAR SPINE WITHOUT CONTRAST   TECHNIQUE: Multiplanar, multisequence MR imaging of the lumbar spine was performed. No intravenous contrast was administered.   COMPARISON:  Radiograph from 04/10/2021.   FINDINGS: Segmentation: Standard. Lowest well-formed disc space labeled  the L5-S1 level.   Alignment: Moderate dextroscoliosis with apex at L3. Trace retrolisthesis of T11 on T12 and L1 on L2.   Vertebrae: Vertebral body height maintained without acute or chronic fracture. Bone marrow signal intensity within normal limits. Benign hemangioma noted within the right pedicle of L1. No other discrete or worrisome osseous lesions. Prominent discogenic reactive endplate change present about the L3-4 and L4-5 interspaces related to underlying scoliotic curvature. Mild reactive marrow edema also noted about the left L4-5 facet due to facet arthritis.   Conus medullaris and cauda equina: Conus extends to the T12-L1 level. Conus and cauda equina appear normal.   Paraspinal and other soft tissues: Unremarkable.   Disc levels:   T11-12: Seen only on sagittal projection. Disc bulge with disc desiccation with mild reactive endplate change. Mild facet hypertrophy. No significant stenosis.   T12-L1: Unremarkable.   L1-2: Mild disc bulge with disc desiccation and reactive endplate spurring. Disc bulging asymmetric to the right. Superimposed small central disc protrusion minimally indents the ventral thecal sac. Mild facet hypertrophy. No spinal stenosis. Mild right L1 foraminal narrowing. Left neural foramina remains patent.   L2-3: Degenerative intervertebral disc space narrowing with diffuse disc bulge and disc desiccation. Disc bulging eccentric to the left with associated left foraminal to extraforaminal disc protrusion (series 8, image 18). Mild facet and ligament flavum hypertrophy. Mild narrowing of the left lateral recess. Central canal remains patent. Mild to moderate left L2 foraminal narrowing. Right neural foramina remains patent.   L3-4: Degenerative intervertebral disc space narrowing with disc desiccation and diffuse disc bulge, asymmetric to the left. Prominent reactive endplate spurring with marrow edema, also greater on the left. Resultant  large extraforaminal disc osteophyte complex (series 7, image 23). Moderate left worse than right facet hypertrophy. Resultant mild narrowing of the left lateral recess. Central canal remains patent. Moderate left L3 foraminal stenosis. Right neural foramina remains patent.   L4-5: Degenerative intervertebral disc space narrowing with disc desiccation and diffuse disc bulge. Associated discogenic reactive endplate change with marginal endplate osteophytic spurring. Disc bulging eccentric to the right. Superimposed mild to moderate facet hypertrophy. Resultant mild to moderate right lateral recess narrowing. Central canal remains patent. Severe right with moderate left L4 foraminal stenosis (series 5, image 7).   L5-S1:  Normal interspace.  Mild facet hypertrophy.  No stenosis.   IMPRESSION: 1. 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 Query right L4 radiculitis. 2. Left eccentric disc bulge and reactive endplate changes at O7-1 with resultant moderate left L3 foraminal stenosis. 3. Left eccentric disc bulge with foraminal disc protrusion at L2-3 with resultant mild to moderate left lateral recess and foraminal stenosis. 4. Underlying moderate dextroscoliosis.     Electronically Signed   By: Jeannine Boga M.D.   On: 06/21/2021 03:28   She reports that she quit smoking about 3 years ago. Her smoking use included cigarettes. She has a 15.00 pack-year smoking history. She has quit using smokeless tobacco.  Recent Labs    11/13/20 1122 02/16/21 0951 08/06/21 1431  HGBA1C 6.4 6.3 5.9*    Objective:  VS:  HT:    WT:   BMI:  BP: (!) 145/78  HR:94bpm  TEMP: ( )  RESP:  Physical Exam Vitals and nursing note reviewed.  HENT:     Head: Normocephalic.     Right Ear: External ear normal.     Left Ear: External ear normal.     Nose: Nose normal.     Mouth/Throat:     Mouth: Mucous  membranes are moist.  Eyes:     Extraocular Movements: Extraocular movements intact.  Cardiovascular:     Rate and Rhythm: Normal rate.     Pulses: Normal pulses.  Pulmonary:     Effort: Pulmonary effort is normal.  Abdominal:     General: Abdomen is flat. There is no distension.  Musculoskeletal:        General: Tenderness present.     Cervical back: Normal range of motion.     Comments: Pt is slow to rise from seated position to standing. Good lumbar range of motion. Strong distal strength without clonus, no pain upon palpation of greater trochanters. Sensation intact bilaterally. Dysesthesias noted to left L4/L5 dermatomes. Walks independently, gait steady. Positive slump test.    Skin:    General: Skin is warm and dry.     Capillary Refill: Capillary refill takes less than 2 seconds.  Neurological:     General: No focal deficit present.     Mental Status: She is alert and oriented to person, place, and time.  Psychiatric:        Mood and Affect: Mood normal.    Ortho Exam  Imaging: No results found.  Past Medical/Family/Surgical/Social History: Medications & Allergies reviewed per EMR, new medications updated. Patient Active Problem List   Diagnosis Date Noted   Hypothyroidism 11/13/2020   Prediabetes 11/13/2020   B12 deficiency 11/13/2020   COPD without exacerbation ([place]) 11/13/2020   Vitamin D deficiency 11/13/2020   Neuropathy 11/13/2020   Rheumatoid arthritis involving multiple sites ([place]) 11/13/2020   Mixed hyperlipidemia 11/13/2020   Aortic atherosclerosis ([place]) 11/13/2020   GERD (gastroesophageal reflux disease) 11/05/2020   Depression, recurrent (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 03/26/2020   Hx of colonic polyps  05/03/2019   H/O Clostridium difficile infection 05/03/2019   Normocytic anemia 05/03/2019   Chronic diarrhea 01/29/2019   Past Medical History:  Diagnosis Date   Aortic atherosclerosis ([place]) 11/13/2020   CKD (chronic kidney disease) stage 2, GFR 60-89 ml/min    COPD (chronic obstructive pulmonary disease) ([place])    COVID-19 06/2019   Depression    Essential hypertension    GERD (gastroesophageal reflux disease)    History of diabetes mellitus    Hyperlipidemia    Hypothyroidism    Neuropathy    Rheumatoid arthritis (HCC)    Vitamin B12 deficiency    Vitamin D deficiency    Family History  Problem Relation Age of Onset   Stroke Mother    Heart disease Mother    Diabetes Mother    Breast cancer Mother    Other Father        stomach taken out for some reason   Migraines Daughter    Colon cancer Neg Hx    Past Surgical History:  Procedure Laterality Date   BIOPSY  09/16/2019   Procedure: BIOPSY;  Surgeon: Daneil Dolin, MD;  [facility];  Service: Endoscopy;;   BIOPSY  04/28/2020   Procedure: BIOPSY;  Surgeon: Daneil Dolin, MD;  [facility];  Service: Endoscopy;;   BIOPSY  06/08/2020   Procedure: BIOPSY;  Surgeon: Daneil Dolin, MD;  [facility];  Service: Endoscopy;;   CATARACT EXTRACTION Bilateral    COLONOSCOPY  09/2010   Dr. Posey Pronto: mild diverticulsis in sigmoid colon.    COLONOSCOPY WITH PROPOFOL N/A 09/16/2019   Rourk: Diverticulosis, random colon biopsies negative for microscopic colitis.   COLONOSCOPY WITH PROPOFOL N/A 06/08/2020   Procedure: COLONOSCOPY WITH PROPOFOL;  Surgeon: Daneil Dolin, MD;  [facility];  Service: Endoscopy;  Laterality: N/A;  9:15am   ESOPHAGOGASTRODUODENOSCOPY (EGD) WITH PROPOFOL N/A 04/28/2020   Procedure: ESOPHAGOGASTRODUODENOSCOPY (EGD) WITH PROPOFOL;  Surgeon: Daneil Dolin, MD;  [facility];  Service: Endoscopy;  Laterality: N/A;  6:60   YAG LASER APPLICATION Left  63/09/6008   Procedure: YAG LASER APPLICATION;  Surgeon: Williams Che, MD;  Location: [facility];  Service: Ophthalmology;  Laterality: Left;   Social History   Occupational History   Occupation: DISABLED  Tobacco Use   Smoking status: Former    Packs/day: 1.00    Years: 15.00    Pack years: 15.00    Types: Cigarettes    Quit date: 09/11/2017    Years since quitting: 3.9   Smokeless tobacco: Former  Scientific laboratory technician Use: Never used  Substance and Sexual Activity   Alcohol use: Never   Drug use: Never   Sexual activity: Not Currently

## 2021-09-06 NOTE — Progress Notes (Deleted)
Erica Beasley - 65 y.o. female MRN [id number]  Date of birth: [birth year]  Office Visit Note: Visit Date: 08/17/2021 PCP: Loman Brooklyn, FNP Referred by: Loman Brooklyn, FNP  Subjective: Chief Complaint  Patient presents with   Lower Back - Pain   Left Leg - Pain   HPI: Erica Beasley is a 65 y.o. female who comes in today  ROS Otherwise per HPI.  Assessment & Plan: Visit Diagnoses:    ICD-10-CM   1. Lumbar radiculopathy  M54.16 XR C-ARM NO REPORT    Epidural Steroid injection    betamethasone acetate-betamethasone sodium phosphate (CELESTONE) injection 12 mg    Ambulatory referral to Physical Medicine Rehab    2. Intervertebral disc disorders with radiculopathy, lumbar region  M51.16     3. Facet arthropathy  M47.819     4. Foraminal stenosis of lumbar region  M48.061        Plan:   Meds & Orders:  Meds ordered this encounter  Medications   betamethasone acetate-betamethasone sodium phosphate (CELESTONE) injection 12 mg    Orders Placed This Encounter  Procedures   XR C-ARM NO REPORT   Ambulatory referral to Physical Medicine Rehab   Epidural Steroid injection    Follow-up: Return for Left L4 transforaminal epidural steroid injection.   Procedures: No procedures performed      Clinical History: EXAM: MRI LUMBAR SPINE WITHOUT CONTRAST   TECHNIQUE: Multiplanar, multisequence MR imaging of the lumbar spine was performed. No intravenous contrast was administered.   COMPARISON:  Radiograph from 04/10/2021.   FINDINGS: Segmentation: Standard. Lowest well-formed disc space labeled the L5-S1 level.   Alignment: Moderate dextroscoliosis with apex at L3. Trace retrolisthesis of T11 on T12 and L1 on L2.   Vertebrae: Vertebral body height maintained without acute or chronic fracture. Bone marrow signal intensity within normal limits. Benign hemangioma noted within the right pedicle of L1. No other discrete or worrisome osseous lesions. Prominent  discogenic reactive endplate change present about the L3-4 and L4-5 interspaces related to underlying scoliotic curvature. Mild reactive marrow edema also noted about the left L4-5 facet due to facet arthritis.   Conus medullaris and cauda equina: Conus extends to the T12-L1 level. Conus and cauda equina appear normal.   Paraspinal and other soft tissues: Unremarkable.   Disc levels:   T11-12: Seen only on sagittal projection. Disc bulge with disc desiccation with mild reactive endplate change. Mild facet hypertrophy. No significant stenosis.   T12-L1: Unremarkable.   L1-2: Mild disc bulge with disc desiccation and reactive endplate spurring. Disc bulging asymmetric to the right. Superimposed small central disc protrusion minimally indents the ventral thecal sac. Mild facet hypertrophy. No spinal stenosis. Mild right L1 foraminal narrowing. Left neural foramina remains patent.   L2-3: Degenerative intervertebral disc space narrowing with diffuse disc bulge and disc desiccation. Disc bulging eccentric to the left with associated left foraminal to extraforaminal disc protrusion (series 8, image 18). Mild facet and ligament flavum hypertrophy. Mild narrowing of the left lateral recess. Central canal remains patent. Mild to moderate left L2 foraminal narrowing. Right neural foramina remains patent.   L3-4: Degenerative intervertebral disc space narrowing with disc desiccation and diffuse disc bulge, asymmetric to the left. Prominent reactive endplate spurring with marrow edema, also greater on the left. Resultant large extraforaminal disc osteophyte complex (series 7, image 23). Moderate left worse than right facet hypertrophy. Resultant mild narrowing of the left lateral recess. Central canal remains patent. Moderate left L3 foraminal stenosis.  Right neural foramina remains patent.   L4-5: Degenerative intervertebral disc space narrowing with disc desiccation and diffuse disc  bulge. Associated discogenic reactive endplate change with marginal endplate osteophytic spurring. Disc bulging eccentric to the right. Superimposed mild to moderate facet hypertrophy. Resultant mild to moderate right lateral recess narrowing. Central canal remains patent. Severe right with moderate left L4 foraminal stenosis (series 5, image 7).   L5-S1:  Normal interspace.  Mild facet hypertrophy.  No stenosis.   IMPRESSION: 1. 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 Query right L4 radiculitis. 2. Left eccentric disc bulge and reactive endplate changes at B3-4 with resultant moderate left L3 foraminal stenosis. 3. Left eccentric disc bulge with foraminal disc protrusion at L2-3 with resultant mild to moderate left lateral recess and foraminal stenosis. 4. Underlying moderate dextroscoliosis.     Electronically Signed   By: Jeannine Boga M.D.   On: 06/21/2021 03:28   She reports that she quit smoking about 3 years ago. Her smoking use included cigarettes. She has a 15.00 pack-year smoking history. She has quit using smokeless tobacco.  Recent Labs    11/13/20 1122 02/16/21 0951 08/06/21 1431  HGBA1C 6.4 6.3 5.9*    Objective:  VS:  HT:    WT:   BMI:     BP:125/72  HR:77bpm  TEMP: ( )  RESP:    Ortho Exam  Imaging: No results found.  Past Medical/Family/Surgical/Social History: Medications & Allergies reviewed per EMR, new medications updated. Patient Active Problem List   Diagnosis Date Noted   Hypothyroidism 11/13/2020   Prediabetes 11/13/2020   B12 deficiency 11/13/2020   COPD without exacerbation ([place]) 11/13/2020   Vitamin D deficiency 11/13/2020   Neuropathy 11/13/2020   Rheumatoid arthritis involving multiple sites ([place]) 11/13/2020   Mixed hyperlipidemia 11/13/2020   Aortic atherosclerosis ([place]) 11/13/2020   GERD (gastroesophageal reflux disease) 11/05/2020    Depression, recurrent ([place]) 09/22/2020   Essential hypertension 09/22/2020   Dehydration 08/13/2020   Hypokalemia 08/13/2020   Abnormal CT of the abdomen 06/02/2020   N&V (nausea and vomiting) 04/08/2020   Monoclonal gammopathy 04/03/2020   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 03/26/2020   Hx of colonic polyps 05/03/2019   H/O Clostridium difficile infection 05/03/2019   Normocytic anemia 05/03/2019   Chronic diarrhea 01/29/2019   Past Medical History:  Diagnosis Date   Aortic atherosclerosis ([place]) 11/13/2020   CKD (chronic kidney disease) stage 2, GFR 60-89 ml/min    COPD (chronic obstructive pulmonary disease) ([place])    COVID-19 06/2019   Depression    Essential hypertension    GERD (gastroesophageal reflux disease)    History of diabetes mellitus    Hyperlipidemia    Hypothyroidism    Neuropathy    Rheumatoid arthritis (HCC)    Vitamin B12 deficiency    Vitamin D deficiency    Family History  Problem Relation Age of Onset   Stroke Mother    Heart disease Mother    Diabetes Mother    Breast cancer Mother    Other Father        stomach taken out for some reason   Migraines Daughter    Colon cancer Neg Hx    Past Surgical History:  Procedure Laterality Date   BIOPSY  09/16/2019   Procedure: BIOPSY;  Surgeon: Daneil Dolin, MD;  [facility];  Service: Endoscopy;;   BIOPSY  04/28/2020   Procedure: BIOPSY;  Surgeon: Daneil Dolin, MD;  [facility];  Service: Endoscopy;;   BIOPSY  06/08/2020   Procedure: BIOPSY;  Surgeon: Daneil Dolin, MD;  [facility];  Service: Endoscopy;;   CATARACT EXTRACTION Bilateral    COLONOSCOPY  09/2010   Dr. Posey Pronto: mild diverticulsis in sigmoid colon.    COLONOSCOPY WITH PROPOFOL N/A 09/16/2019   Rourk: Diverticulosis, random colon biopsies negative for microscopic colitis.   COLONOSCOPY WITH PROPOFOL N/A 06/08/2020   Procedure: COLONOSCOPY WITH PROPOFOL;  Surgeon: Daneil Dolin,  MD;  [facility];  Service: Endoscopy;  Laterality: N/A;  9:15am   ESOPHAGOGASTRODUODENOSCOPY (EGD) WITH PROPOFOL N/A 04/28/2020   Procedure: ESOPHAGOGASTRODUODENOSCOPY (EGD) WITH PROPOFOL;  Surgeon: Daneil Dolin, MD;  [facility];  Service: Endoscopy;  Laterality: N/A;  5:32   YAG LASER APPLICATION Left 99/24/2683   Procedure: YAG LASER APPLICATION;  Surgeon: Williams Che, MD;  Location: [facility];  Service: Ophthalmology;  Laterality: Left;   Social History   Occupational History   Occupation: DISABLED  Tobacco Use   Smoking status: Former    Packs/day: 1.00    Years: 15.00    Pack years: 15.00    Types: Cigarettes    Quit date: 09/11/2017    Years since quitting: 3.9   Smokeless tobacco: Former  Scientific laboratory technician Use: Never used  Substance and Sexual Activity   Alcohol use: Never   Drug use: Never   Sexual activity: Not Currently

## 2021-09-06 NOTE — Progress Notes (Signed)
Pt state lower back pain that travels down her right leg.Pt state she is also now having pain in her left knee Pt state walking makes the pain worse. Pt state she takes over the counter pain. Pt has hx of inj on 08/17/21 pt state it helped for a week.  Numeric Pain Rating Scale and Functional Assessment Average Pain 10 Pain Right Now 8 My pain is constant, sharp, and aching Pain is worse with: walking and some activites Pain improves with: medication   In the last MONTH (on 0-10 scale) has pain interfered with the following?  1. General activity like being  able to carry out your everyday physical activities such as walking, climbing stairs, carrying groceries, or moving a chair?  Rating(7)  2. Relation with others like being able to carry out your usual social activities and roles such as  activities at home, at work and in your community. Rating(8)  3. Enjoyment of life such that you have  been bothered by emotional problems such as feeling anxious, depressed or irritable?  Rating(9)

## 2021-09-07 NOTE — Procedures (Signed)
Lumbar Epidural Steroid Injection - Interlaminar Approach with Fluoroscopic Guidance  Patient: Erica Beasley      Date of Birth: Jun 02, 1956 MRN: [id number] PCP: Loman Brooklyn, FNP      Visit Date: 08/17/2021   Universal Protocol:     Consent Given By: the patient  Position: PRONE  Additional Comments: Vital signs were monitored before and after the procedure. Patient was prepped and draped in the usual sterile fashion. The correct patient, procedure, and site was verified.   Injection Procedure Details:   Procedure diagnoses: Lumbar radiculopathy [M54.16]   Meds Administered:  Meds ordered this encounter  Medications   betamethasone acetate-betamethasone sodium phosphate (CELESTONE) injection 12 mg     Laterality: Right  Location/Site:  L4-5  Needle: 3.5 in., 20 ga. Tuohy  Needle Placement: Paramedian epidural  Findings:   -Comments: Excellent flow of contrast into the epidural space.  Procedure Details: Using a paramedian approach from the side mentioned above, the region overlying the inferior lamina was localized under fluoroscopic visualization and the soft tissues overlying this structure were infiltrated with 4 ml. of 1% Lidocaine without Epinephrine. The Tuohy needle was inserted into the epidural space using a paramedian approach.   The epidural space was localized using loss of resistance along with counter oblique bi-planar fluoroscopic views.  After negative aspirate for air, blood, and CSF, a 2 ml. volume of Isovue-[id number] was injected into the epidural space and the flow of contrast was observed. Radiographs were obtained for documentation purposes.    The injectate was administered into the level noted above.   Additional Comments:  The patient tolerated the procedure well Dressing: 2 x 2 sterile gauze and Band-Aid    Post-procedure details: Patient was observed during the procedure. Post-procedure instructions were reviewed.  Patient left the  clinic in stable condition.

## 2021-09-07 NOTE — Progress Notes (Signed)
Erica Beasley - 65 y.o. female MRN [id number]  Date of birth: Dec 24, 1955  Office Visit Note: Visit Date: 08/17/2021 PCP: Loman Brooklyn, FNP Referred by: Loman Brooklyn, FNP  Subjective: Chief Complaint  Patient presents with   Lower Back - Pain   Left Leg - Pain   HPI:  Erica Beasley is a 65 y.o. female who comes in today at the request of Barnet Pall, FNP for planned Right L4-5 Lumbar Interlaminar epidural steroid injection with fluoroscopic guidance.  The patient has failed conservative care including home exercise, medications, time and activity modification.  This injection will be diagnostic and hopefully therapeutic.  Please see requesting physician notes for further details and justification.   ROS Otherwise per HPI.  Assessment & Plan: Visit Diagnoses:    ICD-10-CM   1. Lumbar radiculopathy  M54.16 XR C-ARM NO REPORT    Epidural Steroid injection    betamethasone acetate-betamethasone sodium phosphate (CELESTONE) injection 12 mg    Ambulatory referral to Physical Medicine Rehab    2. Intervertebral disc disorders with radiculopathy, lumbar region  M51.16     3. Facet arthropathy  M47.819     4. Foraminal stenosis of lumbar region  M48.061       Plan: No additional findings.   Meds & Orders:  Meds ordered this encounter  Medications   betamethasone acetate-betamethasone sodium phosphate (CELESTONE) injection 12 mg    Orders Placed This Encounter  Procedures   XR C-ARM NO REPORT   Ambulatory referral to Physical Medicine Rehab   Epidural Steroid injection    Follow-up: Return if symptoms worsen or fail to improve.   Procedures: No procedures performed  Lumbar Epidural Steroid Injection - Interlaminar Approach with Fluoroscopic Guidance  Patient: Erica Beasley      Date of Birth: May 29, 1956 MRN: [id number] PCP: Loman Brooklyn, FNP      Visit Date: 08/17/2021   Universal Protocol:     Consent Given By: the patient  Position:  PRONE  Additional Comments: Vital signs were monitored before and after the procedure. Patient was prepped and draped in the usual sterile fashion. The correct patient, procedure, and site was verified.   Injection Procedure Details:   Procedure diagnoses: Lumbar radiculopathy [M54.16]   Meds Administered:  Meds ordered this encounter  Medications   betamethasone acetate-betamethasone sodium phosphate (CELESTONE) injection 12 mg     Laterality: Right  Location/Site:  L4-5  Needle: 3.5 in., 20 ga. Tuohy  Needle Placement: Paramedian epidural  Findings:   -Comments: Excellent flow of contrast into the epidural space.  Procedure Details: Using a paramedian approach from the side mentioned above, the region overlying the inferior lamina was localized under fluoroscopic visualization and the soft tissues overlying this structure were infiltrated with 4 ml. of 1% Lidocaine without Epinephrine. The Tuohy needle was inserted into the epidural space using a paramedian approach.   The epidural space was localized using loss of resistance along with counter oblique bi-planar fluoroscopic views.  After negative aspirate for air, blood, and CSF, a 2 ml. volume of Isovue-250 was injected into the epidural space and the flow of contrast was observed. Radiographs were obtained for documentation purposes.    The injectate was administered into the level noted above.   Additional Comments:  The patient tolerated the procedure well Dressing: 2 x 2 sterile gauze and Band-Aid    Post-procedure details: Patient was observed during the procedure. Post-procedure instructions were reviewed.  Patient left the clinic in  stable condition.   Clinical History: EXAM: MRI LUMBAR SPINE WITHOUT CONTRAST   TECHNIQUE: Multiplanar, multisequence MR imaging of the lumbar spine was performed. No intravenous contrast was administered.   COMPARISON:  Radiograph from 04/10/2021.    FINDINGS: Segmentation: Standard. Lowest well-formed disc space labeled the L5-S1 level.   Alignment: Moderate dextroscoliosis with apex at L3. Trace retrolisthesis of T11 on T12 and L1 on L2.   Vertebrae: Vertebral body height maintained without acute or chronic fracture. Bone marrow signal intensity within normal limits. Benign hemangioma noted within the right pedicle of L1. No other discrete or worrisome osseous lesions. Prominent discogenic reactive endplate change present about the L3-4 and L4-5 interspaces related to underlying scoliotic curvature. Mild reactive marrow edema also noted about the left L4-5 facet due to facet arthritis.   Conus medullaris and cauda equina: Conus extends to the T12-L1 level. Conus and cauda equina appear normal.   Paraspinal and other soft tissues: Unremarkable.   Disc levels:   T11-12: Seen only on sagittal projection. Disc bulge with disc desiccation with mild reactive endplate change. Mild facet hypertrophy. No significant stenosis.   T12-L1: Unremarkable.   L1-2: Mild disc bulge with disc desiccation and reactive endplate spurring. Disc bulging asymmetric to the right. Superimposed small central disc protrusion minimally indents the ventral thecal sac. Mild facet hypertrophy. No spinal stenosis. Mild right L1 foraminal narrowing. Left neural foramina remains patent.   L2-3: Degenerative intervertebral disc space narrowing with diffuse disc bulge and disc desiccation. Disc bulging eccentric to the left with associated left foraminal to extraforaminal disc protrusion (series 8, image 18). Mild facet and ligament flavum hypertrophy. Mild narrowing of the left lateral recess. Central canal remains patent. Mild to moderate left L2 foraminal narrowing. Right neural foramina remains patent.   L3-4: Degenerative intervertebral disc space narrowing with disc desiccation and diffuse disc bulge, asymmetric to the left. Prominent reactive  endplate spurring with marrow edema, also greater on the left. Resultant large extraforaminal disc osteophyte complex (series 7, image 23). Moderate left worse than right facet hypertrophy. Resultant mild narrowing of the left lateral recess. Central canal remains patent. Moderate left L3 foraminal stenosis. Right neural foramina remains patent.   L4-5: Degenerative intervertebral disc space narrowing with disc desiccation and diffuse disc bulge. Associated discogenic reactive endplate change with marginal endplate osteophytic spurring. Disc bulging eccentric to the right. Superimposed mild to moderate facet hypertrophy. Resultant mild to moderate right lateral recess narrowing. Central canal remains patent. Severe right with moderate left L4 foraminal stenosis (series 5, image 7).   L5-S1:  Normal interspace.  Mild facet hypertrophy.  No stenosis.   IMPRESSION: 1. 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 Query right L4 radiculitis. 2. Left eccentric disc bulge and reactive endplate changes at S3-4 with resultant moderate left L3 foraminal stenosis. 3. Left eccentric disc bulge with foraminal disc protrusion at L2-3 with resultant mild to moderate left lateral recess and foraminal stenosis. 4. Underlying moderate dextroscoliosis.     Electronically Signed   By: Jeannine Boga M.D.   On: 06/21/2021 03:28     Objective:  VS:  HT:    WT:   BMI:     BP:125/72  HR:77bpm  TEMP: ( )  RESP:  Physical Exam Vitals and nursing note reviewed.  Constitutional:      General: She is not in acute distress.    Appearance: Normal appearance. She is not ill-appearing.  HENT:     Head: Normocephalic and atraumatic.     Right Ear: External ear normal.     Left Ear: External ear normal.  Eyes:     Extraocular Movements: Extraocular movements intact.  Cardiovascular:     Rate and Rhythm: Normal  rate.     Pulses: Normal pulses.  Pulmonary:     Effort: Pulmonary effort is normal. No respiratory distress.  Abdominal:     General: There is no distension.     Palpations: Abdomen is soft.  Musculoskeletal:        General: Tenderness present.     Cervical back: Neck supple.     Right lower leg: No edema.     Left lower leg: No edema.     Comments: Patient has good distal strength with no pain over the greater trochanters.  No clonus or focal weakness.  Skin:    Findings: No erythema, lesion or rash.  Neurological:     General: No focal deficit present.     Mental Status: She is alert and oriented to person, place, and time.     Sensory: No sensory deficit.     Motor: No weakness or abnormal muscle tone.     Coordination: Coordination normal.  Psychiatric:        Mood and Affect: Mood normal.        Behavior: Behavior normal.     Imaging: No results found.

## 2021-09-18 ENCOUNTER — Encounter: Payer: Self-pay | Admitting: Physical Medicine and Rehabilitation

## 2021-09-21 ENCOUNTER — Encounter: Payer: Self-pay | Admitting: Physical Medicine and Rehabilitation

## 2021-09-21 ENCOUNTER — Ambulatory Visit (HOSPITAL_COMMUNITY)
Admission: RE | Admit: 2021-09-21 | Discharge: 2021-09-21 | Disposition: A | Discharge status: Home / Self Care | Payer: 59 | Source: Ambulatory Visit | Attending: Physician Assistant | Admitting: Physician Assistant

## 2021-09-21 DIAGNOSIS — M069 Rheumatoid arthritis, unspecified: Secondary | ICD-10-CM | POA: Diagnosis not present

## 2021-09-21 MED ORDER — FAMOTIDINE 20 MG PO TABS: 20.0000 mg | ORAL_TABLET | Freq: Once | ORAL | Status: DC

## 2021-09-21 MED ORDER — ACETAMINOPHEN 500 MG PO TABS: 1000.0000 mg | ORAL_TABLET | Freq: Once | ORAL | Status: DC

## 2021-09-21 MED ORDER — SODIUM CHLORIDE 0.9 % IV SOLN
2.0000 mg/kg | Freq: Once | INTRAVENOUS | Status: AC
  Administered 2021-09-21: 13:00:00 110 mg via INTRAVENOUS
  Filled 2021-09-21: qty 8.8

## 2021-09-21 NOTE — Progress Notes (Signed)
Left message on Erica Beasley's voicemail for new orders for Feb 2023 appointment.

## 2021-09-23 ENCOUNTER — Encounter (HOSPITAL_COMMUNITY): Payer: Self-pay | Admitting: Hematology

## 2021-09-28 ENCOUNTER — Other Ambulatory Visit: Payer: Self-pay | Admitting: Family Medicine

## 2021-09-28 DIAGNOSIS — K219 Gastro-esophageal reflux disease without esophagitis: Secondary | ICD-10-CM

## 2021-09-28 LAB — MICROALBUMIN / CREATININE URINE RATIO

## 2021-10-01 ENCOUNTER — Encounter: Payer: Self-pay | Admitting: Physical Medicine and Rehabilitation

## 2021-10-04 ENCOUNTER — Telehealth: Payer: Self-pay | Admitting: Physical Medicine and Rehabilitation

## 2021-10-04 ENCOUNTER — Encounter (HOSPITAL_COMMUNITY): Payer: Self-pay | Admitting: Hematology

## 2021-10-04 NOTE — Telephone Encounter (Signed)
Pts daughter Marguarite Arbour called stating she missed a call from [facility] and would like to be tried again.   [phone]

## 2021-10-13 ENCOUNTER — Encounter: Payer: Self-pay | Admitting: Physical Medicine and Rehabilitation

## 2021-10-13 ENCOUNTER — Ambulatory Visit: Payer: Self-pay

## 2021-10-13 ENCOUNTER — Ambulatory Visit (INDEPENDENT_AMBULATORY_CARE_PROVIDER_SITE_OTHER): Payer: 59 | Admitting: Physical Medicine and Rehabilitation

## 2021-10-13 ENCOUNTER — Other Ambulatory Visit: Payer: Self-pay

## 2021-10-13 VITALS — BP 116/76 | HR 88

## 2021-10-13 DIAGNOSIS — M5416 Radiculopathy, lumbar region: Secondary | ICD-10-CM

## 2021-10-13 MED ORDER — METHYLPREDNISOLONE ACETATE 80 MG/ML IJ SUSP
80.0000 mg | Freq: Once | INTRAMUSCULAR | Status: AC
  Administered 2021-10-13: 80 mg

## 2021-10-13 NOTE — Progress Notes (Signed)
Pt state lower back pain that travels down her right hip and leg.Pt state she is also now having pain in her left knee Pt state walking makes the pain worse. Pt state she takes over the counter pain  Numeric Pain Rating Scale and Functional Assessment Average Pain 9   In the last MONTH (on 0-10 scale) has pain interfered with the following?  1. General activity like being  able to carry out your everyday physical activities such as walking, climbing stairs, carrying groceries, or moving a chair?  Rating(10)   +Driver, -BT, -Dye Allergies.

## 2021-10-13 NOTE — Patient Instructions (Signed)

## 2021-10-13 NOTE — Progress Notes (Signed)
Erica Beasley - 66 y.o. female MRN [id number]  Date of birth: 09-May-1956  Office Visit Note: Visit Date: 10/13/2021 PCP: Loman Brooklyn, FNP Referred by: Loman Brooklyn, FNP  Subjective: Chief Complaint  Patient presents with   Lower Back - Pain   Right Leg - Pain   Right Hip - Pain   HPI:  Erica Beasley is a 66 y.o. female who comes in today at the request of Barnet Pall, FNP for planned Left L4-5 Lumbar Transforaminal epidural steroid injection with fluoroscopic guidance.  The patient has failed conservative care including home exercise, medications, time and activity modification.  This injection will be diagnostic and hopefully therapeutic.  Please see requesting physician notes for further details and justification.  Patient comes in today for what was going to be a planned left-sided injection because in December we saw her in follow-up symptoms had switched from the right to the left side.  She reports to me that recently she had her knees injected and aspirated and her knee pain is a lot better.  This was done at her rheumatologist office.  She is somewhat of a poor historian but really says all the pain really is in the right hip and leg.  She is in a lot more pain now that she was only saw her in December.  The insurance that she has takes forever to get approval.  We did get approval for transforaminal injection.  She has had 3 prior epidural injections with modest relief but not long-term relief.  We have done interlaminar injections at L4/5 as well as a right L5 transforaminal injection.  From a differential standpoint I will complete a right L5 transforaminal injection today diagnostically.  I will see her back for follow-up to see if there is anything else that can be done for her.  Surgical correction would probably be a pretty significant surgery given the amount of degenerative curvature and spondylitic change and foraminal narrowing.  She will continue with home exercise  program and medication.  ROS Otherwise per HPI.  Assessment & Plan: Visit Diagnoses:    ICD-10-CM   1. Lumbar radiculopathy  M54.16 XR C-ARM NO REPORT    Epidural Steroid injection    methylPREDNISolone acetate (DEPO-MEDROL) injection 80 mg      Plan: No additional findings.   Meds & Orders:  Meds ordered this encounter  Medications   methylPREDNISolone acetate (DEPO-MEDROL) injection 80 mg    Orders Placed This Encounter  Procedures   XR C-ARM NO REPORT   Epidural Steroid injection    Follow-up: Return in about 3 weeks (around 11/03/2021).   Procedures: No procedures performed  Lumbosacral Transforaminal Epidural Steroid Injection - Sub-Pedicular Approach with Fluoroscopic Guidance  Patient: Erica Beasley      Date of Birth: 10/15/1955 MRN: [id number] PCP: Loman Brooklyn, FNP      Visit Date: 10/13/2021   Universal Protocol:    Date/Time: 10/13/2021  Consent Given By: the patient  Position: PRONE  Additional Comments: Vital signs were monitored before and after the procedure. Patient was prepped and draped in the usual sterile fashion. The correct patient, procedure, and site was verified.   Injection Procedure Details:   Procedure diagnoses: Lumbar radiculopathy [M54.16]    Meds Administered:  Meds ordered this encounter  Medications   methylPREDNISolone acetate (DEPO-MEDROL) injection 80 mg    Laterality: Right  Location/Site: L4  Needle:5.0 in., 22 ga.  Short bevel or Quincke spinal needle  Needle  Placement: Transforaminal  Findings:    -Comments: Excellent flow of contrast along the nerve, nerve root and into the epidural space.  Patient had concordant pain down the leg do to severe foraminal stenosis.  Procedure Details: After squaring off the end-plates to get a true AP view, the C-arm was positioned so that an oblique view of the foramen as noted above was visualized. The target area is just inferior to the "nose of the scotty dog" or sub  pedicular. The soft tissues overlying this structure were infiltrated with 2-3 ml. of 1% Lidocaine without Epinephrine.  The spinal needle was inserted toward the target using a "trajectory" view along the fluoroscope beam.  Under AP and lateral visualization, the needle was advanced so it did not puncture dura and was located close the 6 O'Clock position of the pedical in AP tracterory. Biplanar projections were used to confirm position. Aspiration was confirmed to be negative for CSF and/or blood. A 1-2 ml. volume of Isovue-250 was injected and flow of contrast was noted at each level. Radiographs were obtained for documentation purposes.   After attaining the desired flow of contrast documented above, a 0.5 to 1.0 ml test dose of 0.25% Marcaine was injected into each respective transforaminal space.  The patient was observed for 90 seconds post injection.  After no sensory deficits were reported, and normal lower extremity motor function was noted,   the above injectate was administered so that equal amounts of the injectate were placed at each foramen (level) into the transforaminal epidural space.   Additional Comments:  No complications occurred Dressing: 2 x 2 sterile gauze and Band-Aid    Post-procedure details: Patient was observed during the procedure. Post-procedure instructions were reviewed.  Patient left the clinic in stable condition.    Clinical History: EXAM: MRI LUMBAR SPINE WITHOUT CONTRAST   TECHNIQUE: Multiplanar, multisequence MR imaging of the lumbar spine was performed. No intravenous contrast was administered.   COMPARISON:  Radiograph from 04/10/2021.   FINDINGS: Segmentation: Standard. Lowest well-formed disc space labeled the L5-S1 level.   Alignment: Moderate dextroscoliosis with apex at L3. Trace retrolisthesis of T11 on T12 and L1 on L2.   Vertebrae: Vertebral body height maintained without acute or chronic fracture. Bone marrow signal intensity  within normal limits. Benign hemangioma noted within the right pedicle of L1. No other discrete or worrisome osseous lesions. Prominent discogenic reactive endplate change present about the L3-4 and L4-5 interspaces related to underlying scoliotic curvature. Mild reactive marrow edema also noted about the left L4-5 facet due to facet arthritis.   Conus medullaris and cauda equina: Conus extends to the T12-L1 level. Conus and cauda equina appear normal.   Paraspinal and other soft tissues: Unremarkable.   Disc levels:   T11-12: Seen only on sagittal projection. Disc bulge with disc desiccation with mild reactive endplate change. Mild facet hypertrophy. No significant stenosis.   T12-L1: Unremarkable.   L1-2: Mild disc bulge with disc desiccation and reactive endplate spurring. Disc bulging asymmetric to the right. Superimposed small central disc protrusion minimally indents the ventral thecal sac. Mild facet hypertrophy. No spinal stenosis. Mild right L1 foraminal narrowing. Left neural foramina remains patent.   L2-3: Degenerative intervertebral disc space narrowing with diffuse disc bulge and disc desiccation. Disc bulging eccentric to the left with associated left foraminal to extraforaminal disc protrusion (series 8, image 18). Mild facet and ligament flavum hypertrophy. Mild narrowing of the left lateral recess. Central canal remains patent. Mild to moderate left L2 foraminal  narrowing. Right neural foramina remains patent.   L3-4: Degenerative intervertebral disc space narrowing with disc desiccation and diffuse disc bulge, asymmetric to the left. Prominent reactive endplate spurring with marrow edema, also greater on the left. Resultant large extraforaminal disc osteophyte complex (series 7, image 23). Moderate left worse than right facet hypertrophy. Resultant mild narrowing of the left lateral recess. Central canal remains patent. Moderate left L3 foraminal  stenosis. Right neural foramina remains patent.   L4-5: Degenerative intervertebral disc space narrowing with disc desiccation and diffuse disc bulge. Associated discogenic reactive endplate change with marginal endplate osteophytic spurring. Disc bulging eccentric to the right. Superimposed mild to moderate facet hypertrophy. Resultant mild to moderate right lateral recess narrowing. Central canal remains patent. Severe right with moderate left L4 foraminal stenosis (series 5, image 7).   L5-S1:  Normal interspace.  Mild facet hypertrophy.  No stenosis.   IMPRESSION: 1. 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 Query right L4 radiculitis. 2. Left eccentric disc bulge and reactive endplate changes at R1-1 with resultant moderate left L3 foraminal stenosis. 3. Left eccentric disc bulge with foraminal disc protrusion at L2-3 with resultant mild to moderate left lateral recess and foraminal stenosis. 4. Underlying moderate dextroscoliosis.     Electronically Signed   By: Jeannine Boga M.D.   On: 06/21/2021 03:28     Objective:  VS:  HT:     WT:    BMI:      BP:116/76   HR:88bpm   TEMP: ( )   RESP:  Physical Exam Vitals and nursing note reviewed.  Constitutional:      General: She is not in acute distress.    Appearance: Normal appearance. She is not ill-appearing.  HENT:     Head: Normocephalic and atraumatic.     Right Ear: External ear normal.     Left Ear: External ear normal.  Eyes:     Extraocular Movements: Extraocular movements intact.  Cardiovascular:     Rate and Rhythm: Normal rate.     Pulses: Normal pulses.  Pulmonary:     Effort: Pulmonary effort is normal. No respiratory distress.  Abdominal:     General: There is no distension.     Palpations: Abdomen is soft.  Musculoskeletal:        General: Tenderness present.     Cervical back: Neck supple.     Right lower  leg: No edema.     Left lower leg: No edema.     Comments: Patient has good distal strength with no pain over the greater trochanters.  No clonus or focal weakness.  Skin:    Findings: No erythema, lesion or rash.  Neurological:     General: No focal deficit present.     Mental Status: She is alert and oriented to person, place, and time.     Sensory: No sensory deficit.     Motor: No weakness or abnormal muscle tone.     Coordination: Coordination normal.  Psychiatric:        Mood and Affect: Mood normal.        Behavior: Behavior normal.     Imaging: No results found.

## 2021-10-13 NOTE — Procedures (Signed)
Lumbosacral Transforaminal Epidural Steroid Injection - Sub-Pedicular Approach with Fluoroscopic Guidance  Patient: Erica Beasley      Date of Birth: 1956/08/17 MRN: [id number] PCP: Loman Brooklyn, FNP      Visit Date: 10/13/2021   Universal Protocol:    Date/Time: 10/13/2021  Consent Given By: the patient  Position: PRONE  Additional Comments: Vital signs were monitored before and after the procedure. Patient was prepped and draped in the usual sterile fashion. The correct patient, procedure, and site was verified.   Injection Procedure Details:   Procedure diagnoses: Lumbar radiculopathy [M54.16]    Meds Administered:  Meds ordered this encounter  Medications   methylPREDNISolone acetate (DEPO-MEDROL) injection 80 mg    Laterality: Right  Location/Site: L4  Needle:5.0 in., 22 ga.  Short bevel or Quincke spinal needle  Needle Placement: Transforaminal  Findings:    -Comments: Excellent flow of contrast along the nerve, nerve root and into the epidural space.  Patient had concordant pain down the leg do to severe foraminal stenosis.  Procedure Details: After squaring off the end-plates to get a true AP view, the C-arm was positioned so that an oblique view of the foramen as noted above was visualized. The target area is just inferior to the "nose of the scotty dog" or sub pedicular. The soft tissues overlying this structure were infiltrated with 2-3 ml. of 1% Lidocaine without Epinephrine.  The spinal needle was inserted toward the target using a "trajectory" view along the fluoroscope beam.  Under AP and lateral visualization, the needle was advanced so it did not puncture dura and was located close the 6 O'Clock position of the pedical in AP tracterory. Biplanar projections were used to confirm position. Aspiration was confirmed to be negative for CSF and/or blood. A 1-2 ml. volume of Isovue-250 was injected and flow of contrast was noted at each level. Radiographs were  obtained for documentation purposes.   After attaining the desired flow of contrast documented above, a 0.5 to 1.0 ml test dose of 0.25% Marcaine was injected into each respective transforaminal space.  The patient was observed for 90 seconds post injection.  After no sensory deficits were reported, and normal lower extremity motor function was noted,   the above injectate was administered so that equal amounts of the injectate were placed at each foramen (level) into the transforaminal epidural space.   Additional Comments:  No complications occurred Dressing: 2 x 2 sterile gauze and Band-Aid    Post-procedure details: Patient was observed during the procedure. Post-procedure instructions were reviewed.  Patient left the clinic in stable condition.

## 2021-10-24 ENCOUNTER — Emergency Department (HOSPITAL_COMMUNITY): Payer: 59

## 2021-10-24 ENCOUNTER — Other Ambulatory Visit: Payer: Self-pay

## 2021-10-24 ENCOUNTER — Emergency Department (HOSPITAL_COMMUNITY)
Admission: EM | Admit: 2021-10-24 | Discharge: 2021-10-24 | Disposition: A | Discharge status: Home / Self Care | Payer: 59 | Attending: Emergency Medicine | Admitting: Emergency Medicine

## 2021-10-24 ENCOUNTER — Encounter (HOSPITAL_COMMUNITY): Payer: Self-pay

## 2021-10-24 DIAGNOSIS — M545 Low back pain, unspecified: Secondary | ICD-10-CM | POA: Diagnosis not present

## 2021-10-24 DIAGNOSIS — Z79899 Other long term (current) drug therapy: Secondary | ICD-10-CM | POA: Diagnosis not present

## 2021-10-24 DIAGNOSIS — E039 Hypothyroidism, unspecified: Secondary | ICD-10-CM | POA: Diagnosis not present

## 2021-10-24 DIAGNOSIS — I129 Hypertensive chronic kidney disease with stage 1 through stage 4 chronic kidney disease, or unspecified chronic kidney disease: Secondary | ICD-10-CM | POA: Diagnosis not present

## 2021-10-24 DIAGNOSIS — Z7982 Long term (current) use of aspirin: Secondary | ICD-10-CM | POA: Insufficient documentation

## 2021-10-24 DIAGNOSIS — N189 Chronic kidney disease, unspecified: Secondary | ICD-10-CM | POA: Diagnosis not present

## 2021-10-24 DIAGNOSIS — J449 Chronic obstructive pulmonary disease, unspecified: Secondary | ICD-10-CM | POA: Insufficient documentation

## 2021-10-24 DIAGNOSIS — M25559 Pain in unspecified hip: Secondary | ICD-10-CM | POA: Diagnosis present

## 2021-10-24 DIAGNOSIS — Z7952 Long term (current) use of systemic steroids: Secondary | ICD-10-CM | POA: Insufficient documentation

## 2021-10-24 IMAGING — DX DG HIP (WITH OR WITHOUT PELVIS) 2-3V*R*
3 series · 3 of 3 positions shown · non-contrast
Comparison: None.

CLINICAL DATA: back pain

EXAM:
DG HIP (WITH OR WITHOUT PELVIS) 2-3V RIGHT

[pelvis ap]
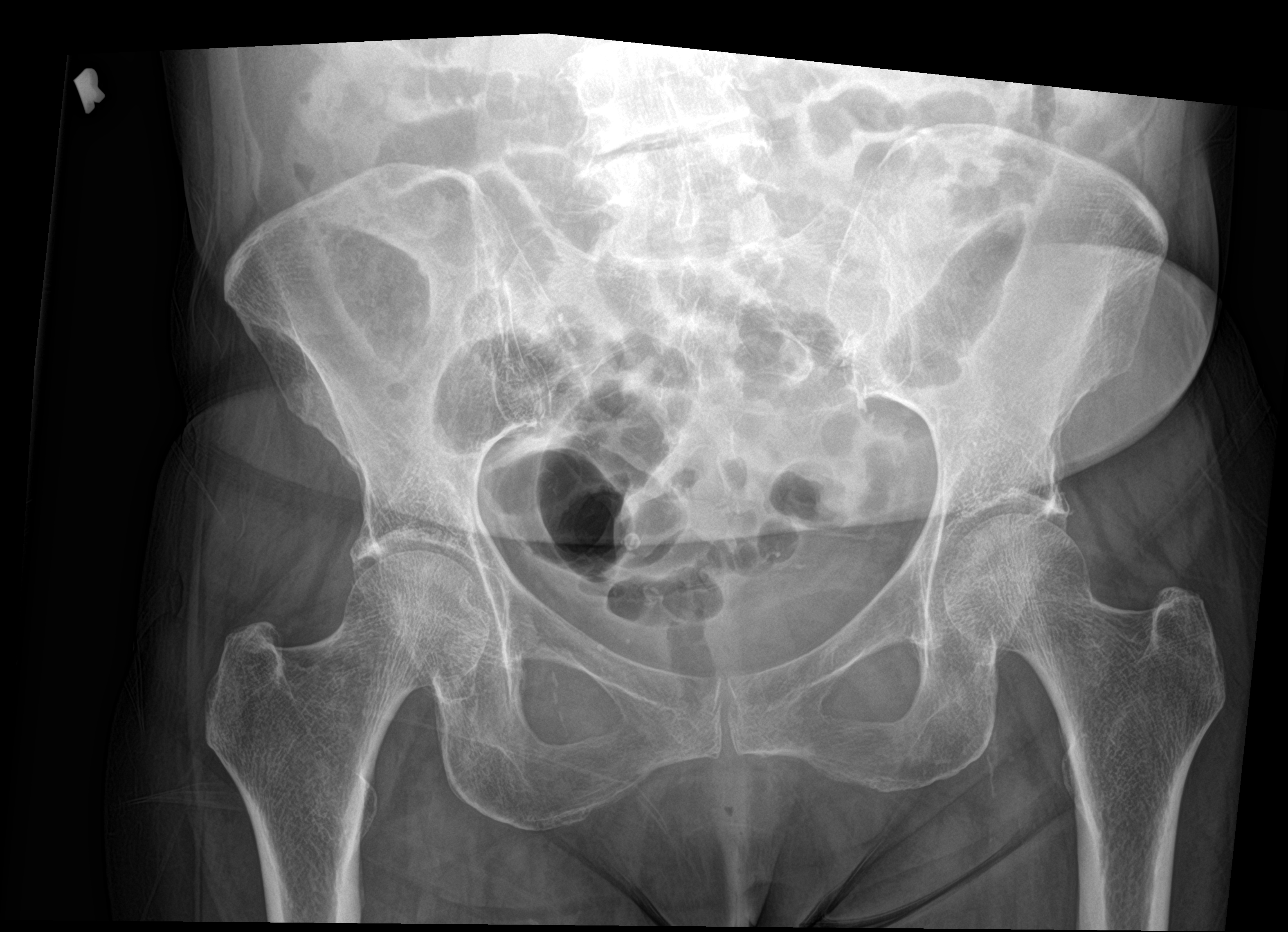

[hip ap]
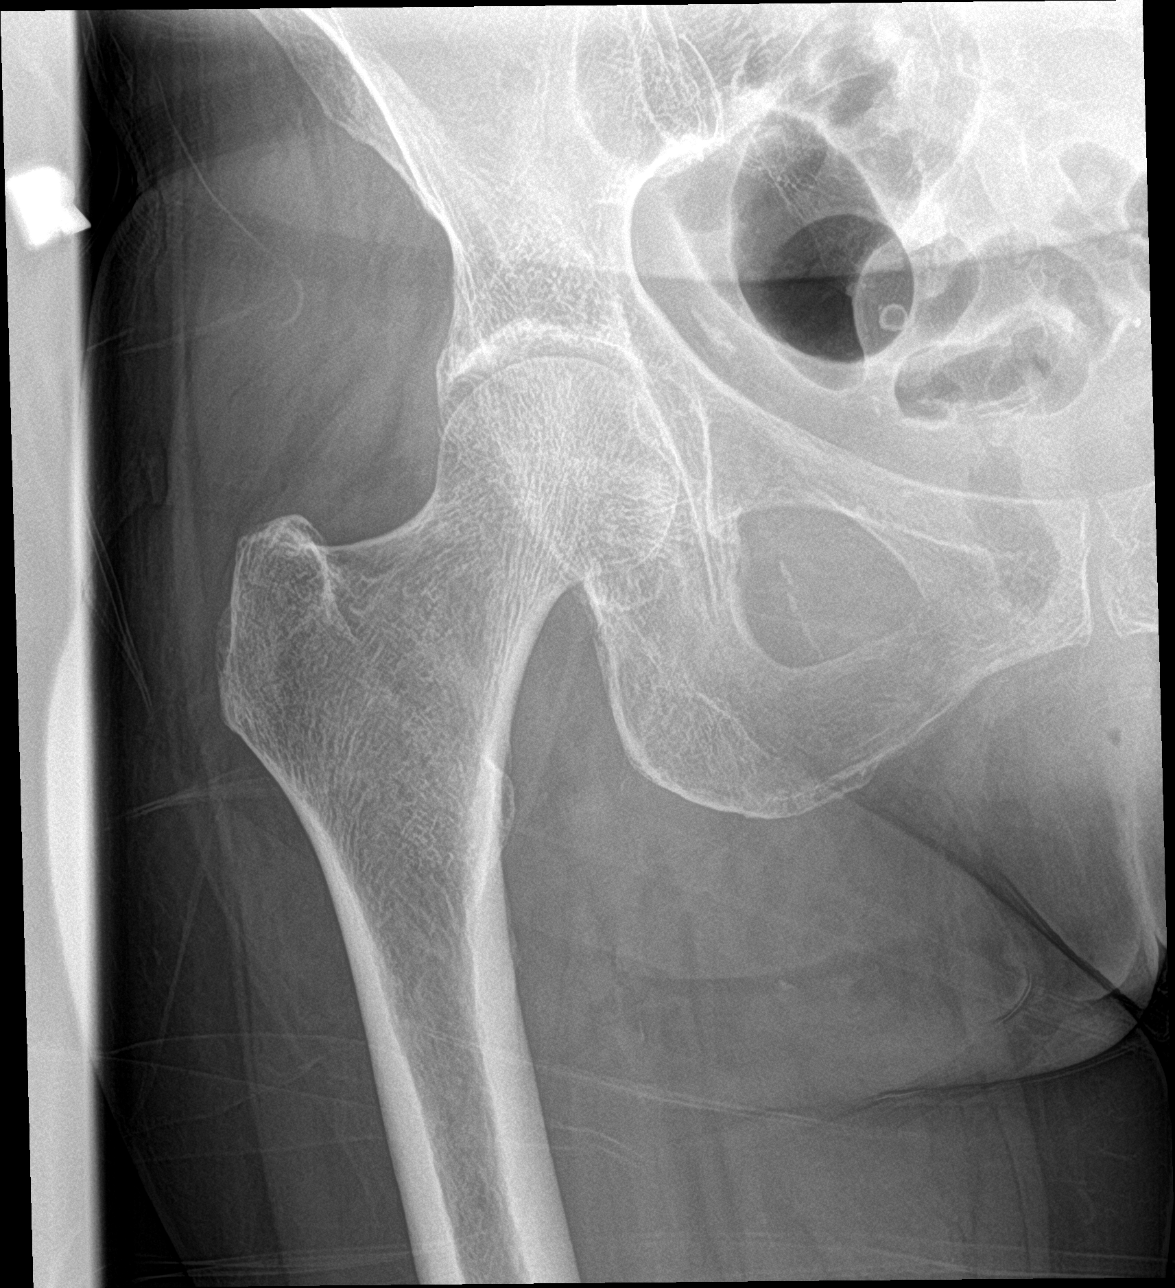

[hip lat]
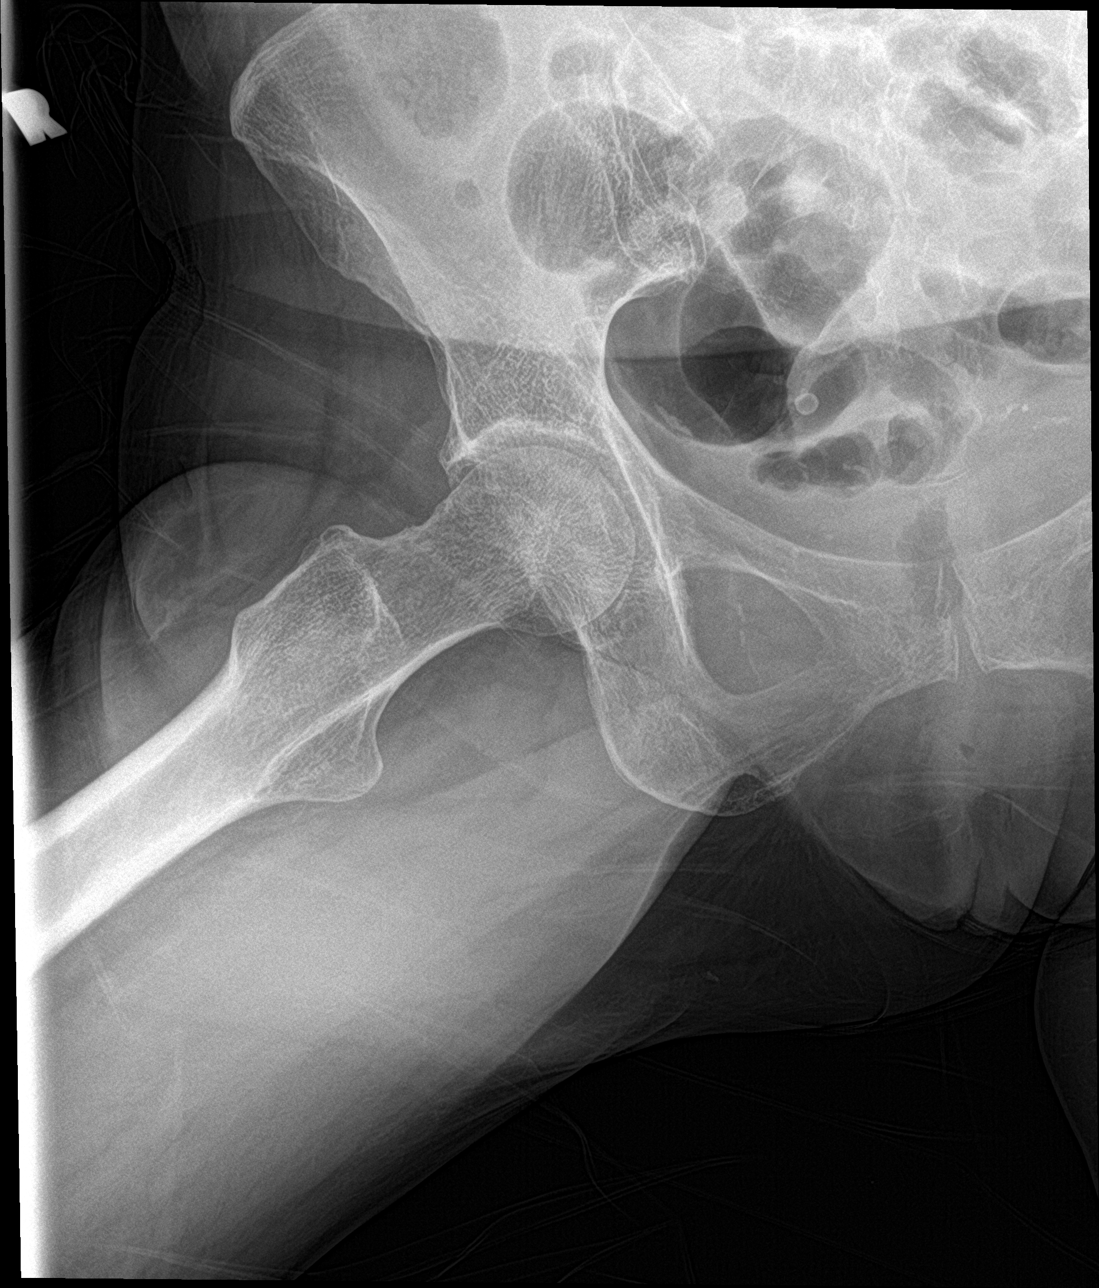

[3 of 3 positions shown; findings below may reference images not displayed]

FINDINGS: Osteopenia. No acute fracture or dislocation. Mild degenerative
changes of the RIGHT hip. No area of erosion or osseous destruction.
No unexpected radiopaque foreign body. Pelvic phleboliths. Limited
assessment of the sacrum secondary to overlapping bowel contents.
IMPRESSION: No acute fracture or dislocation.

If persistent concern for nondisplaced hip or pelvic fracture,
recommend dedicated pelvic MRI.

## 2021-10-24 IMAGING — DX DG LUMBAR SPINE COMPLETE 4+V
5 series · 5 of 5 positions shown · non-contrast
Comparison: [DATE]

CLINICAL DATA: back pain

EXAM:
LUMBAR SPINE - COMPLETE 4+ VIEW

[l-spine ap]
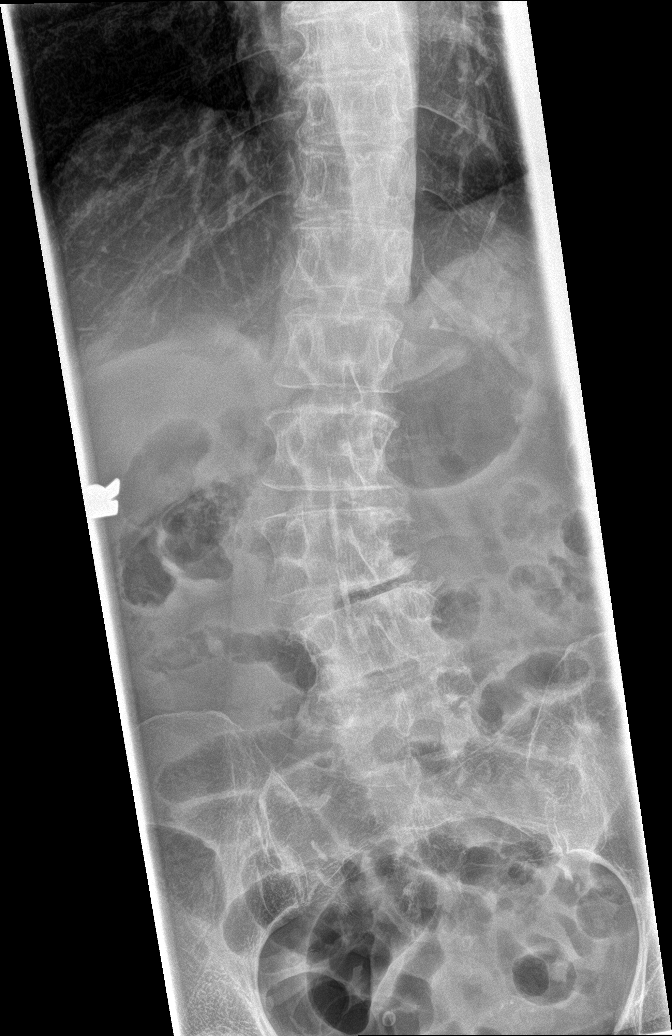

[l-spine obl (1 of 2)]
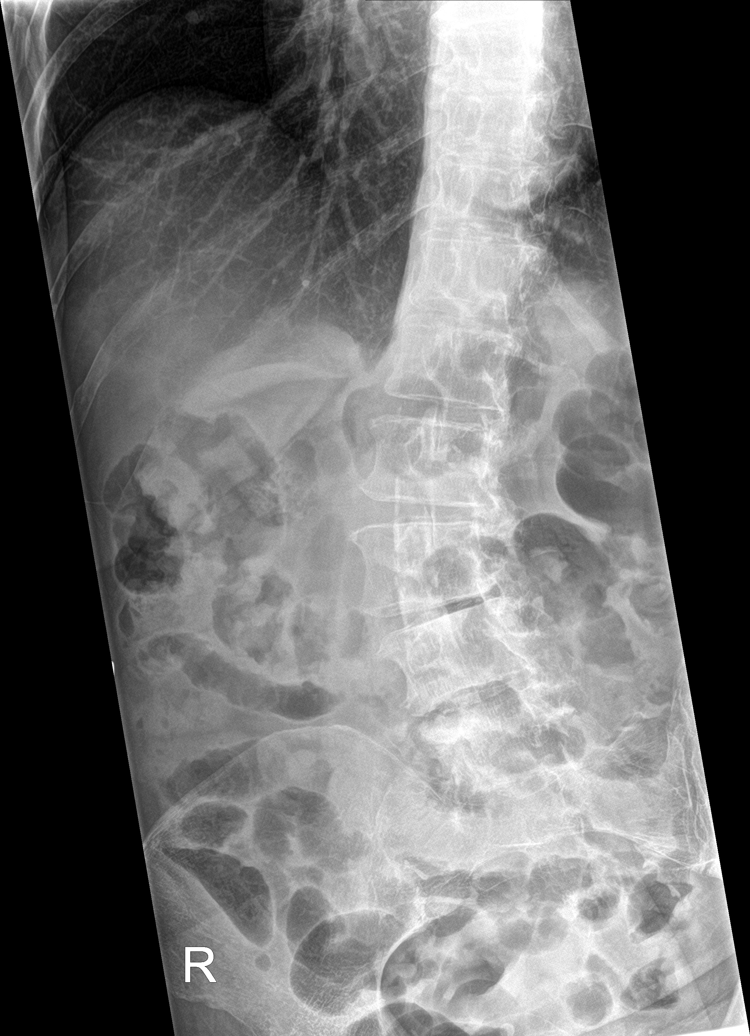

[l-spine obl (2 of 2)]
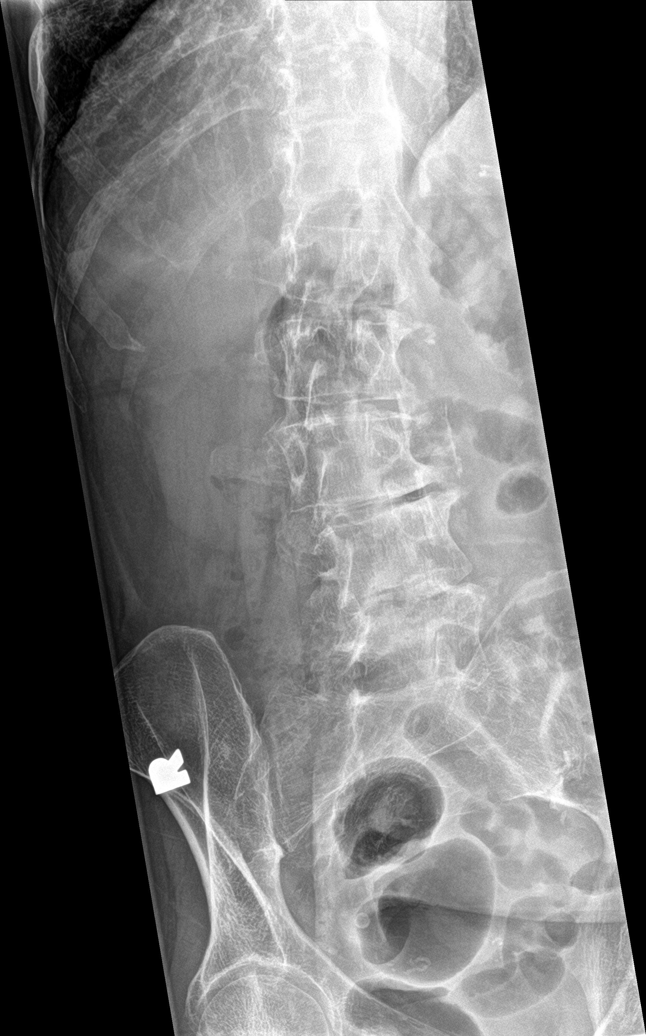

[l-spine lat]
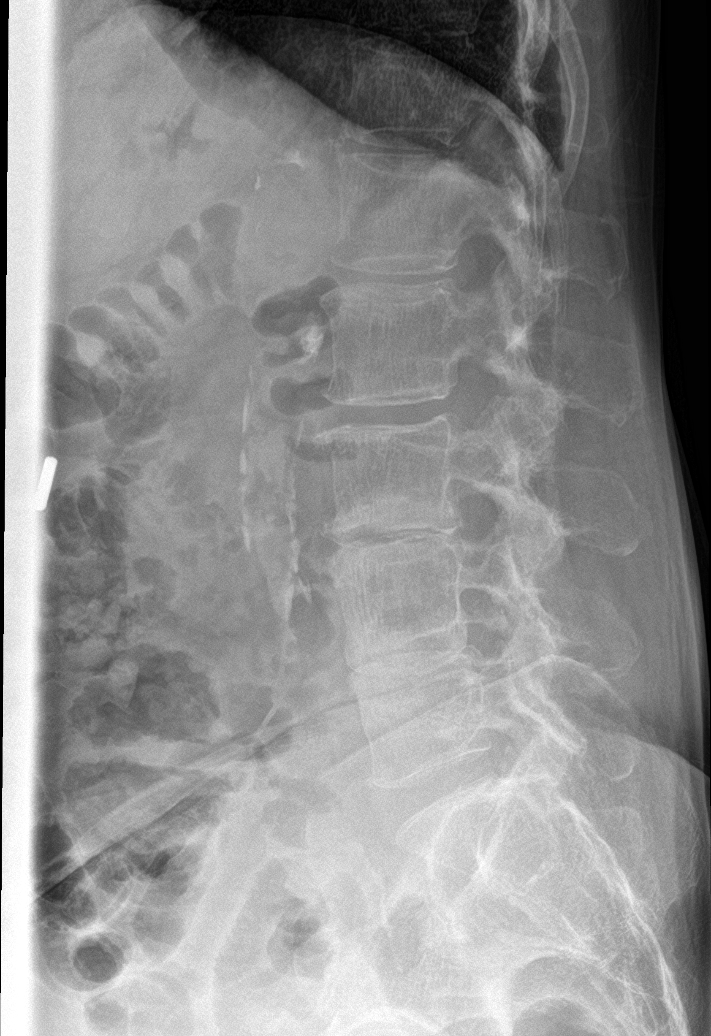

[l-spine spot]
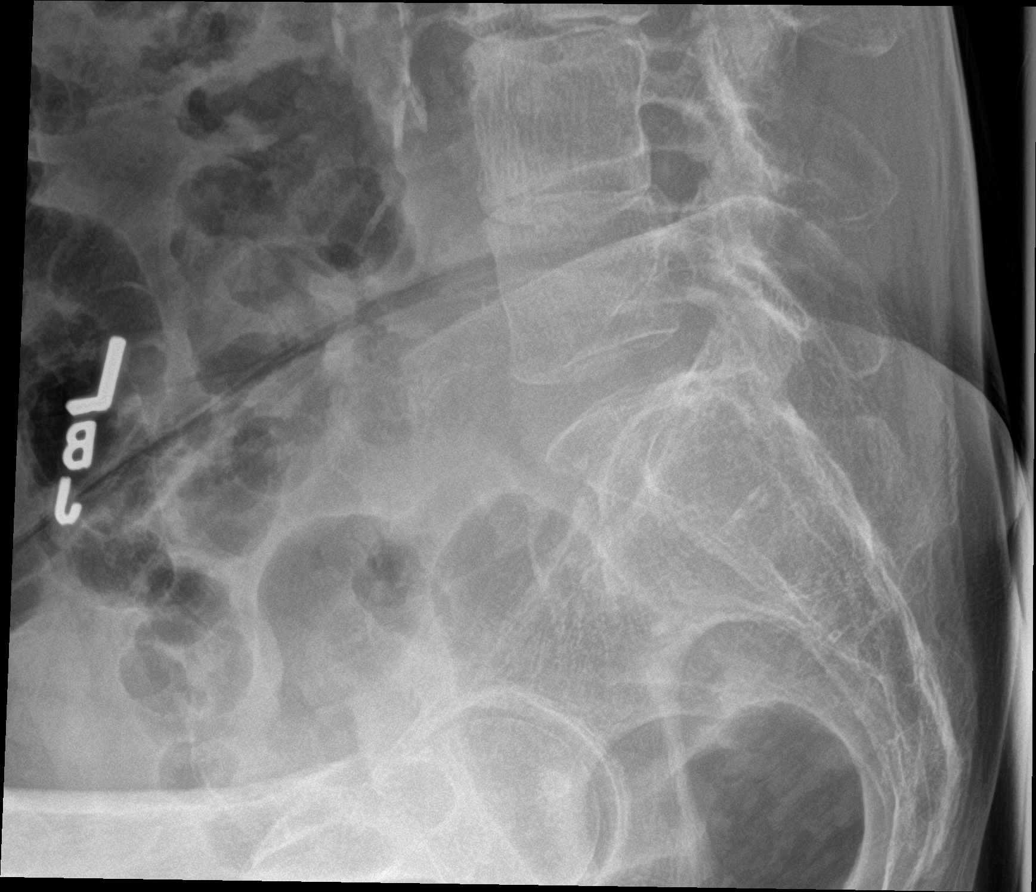

[5 of 5 positions shown; findings below may reference images not displayed]

FINDINGS: There are five non-rib bearing lumbar-type vertebral bodies.
Revisualization of dextrocurvature of the lumbar spine. This is
similar in comparison to prior with asymmetric LEFT-sided disc space
narrowing. This is centered at L3-4. No spondylolisthesis. There is
no evidence for acute fracture or subluxation. Moderate to severe
degenerative changes of L3-4 and moderate degenerative changes of
L4-5. Mild degenerative changes of L2-3. Osteopenia. Facet
arthropathy. Atherosclerotic calcifications of the aorta.
IMPRESSION: Revisualization of dextrocurvature of the lumbar spine centered at
L3-4 with corresponding degenerative changes.

## 2021-10-24 MED ORDER — LIDOCAINE 5 % EX PTCH
1.0000 | MEDICATED_PATCH | CUTANEOUS | Status: DC
  Administered 2021-10-24: 1 via TRANSDERMAL
  Filled 2021-10-24: qty 1

## 2021-10-24 MED ORDER — OXYCODONE-ACETAMINOPHEN 5-325 MG PO TABS: 1.0000 | ORAL_TABLET | Freq: Four times a day (QID) | ORAL | 0 refills | Status: DC | PRN

## 2021-10-24 MED ORDER — OXYCODONE-ACETAMINOPHEN 5-325 MG PO TABS
1.0000 | ORAL_TABLET | Freq: Once | ORAL | Status: AC
  Administered 2021-10-24: 1 via ORAL
  Filled 2021-10-24: qty 1

## 2021-10-24 MED ORDER — METHOCARBAMOL 500 MG PO TABS: 500.0000 mg | ORAL_TABLET | Freq: Two times a day (BID) | ORAL | 0 refills | Status: DC

## 2021-10-24 MED ORDER — METHOCARBAMOL 500 MG PO TABS
500.0000 mg | ORAL_TABLET | Freq: Once | ORAL | Status: AC
  Administered 2021-10-24: 500 mg via ORAL
  Filled 2021-10-24: qty 1

## 2021-10-24 NOTE — ED Provider Notes (Signed)
[facility] Provider Note   CSN: [id number] Arrival date & time: 10/24/21  1118     History  Chief Complaint  Patient presents with   Hip Pain    Erica Beasley is a 66 y.o. female with PMHx HTN, Hypothyroidism, RA, COPD, HLD, CKD who presents to the ED today with complaint of gradual onset, constant, worsening, lower back/right hip pain for the past 1-2 days.  With history of lumbar radiculopathy and foraminal stenosis.  She [facility].  Per daughter this is her third or fourth epidural injection.  She plans to follow-up with them in about 2 weeks time however over the weekend her pain worsened.  Patient states with laying flat she feels slightly comfortable however whenever she attempts to get up to use the bathroom she has significant pain in her lower back and right hip.  Daughter reports that she takes Tylenol as needed for pain and it takes her gabapentin however does not have anything specifically for pain.  She brought her in today for further evaluation.  Patient denies any recent trauma or falls.  She denies any weakness, numbness, tingling down right lower extremity.  She denies any saddle anesthesia, urinary retention, urinary or bowel incontinence, fevers, chills.   The history is provided by the patient and medical records.      Home Medications Prior to Admission medications   Medication Sig Start Date End Date Taking? Authorizing Provider  albuterol (VENTOLIN HFA) 108 (90 Base) MCG/ACT inhaler Inhale 2 puffs into the lungs every 6 (six) hours as needed for wheezing or shortness of breath.    [provider]  aspirin 81 MG EC tablet Take 1 tablet (81 mg total) by mouth daily. Swallow whole. 02/16/21   Loman Brooklyn, FNP  Cholecalciferol (VITAMIN D3) 125 MCG (5000 UT) TABS Take 1 tablet (5,000 Units total) by mouth daily. 05/07/20   Ivy Lynn, NP  citalopram (CELEXA) 40 MG tablet TAKE 1/2 TABLET  ONCE A DAY AS DIRECTED 07/29/21   Derek Jack, MD  gabapentin (NEURONTIN) 300 MG capsule TAKE (1) CAPSULE THREE TIMES DAILY. 08/30/21   Loman Brooklyn, FNP  hydroxychloroquine (PLAQUENIL) 200 MG tablet Take 200 mg by mouth daily.     [provider]  leflunomide (ARAVA) 20 MG tablet Take 20 mg by mouth daily.    [provider]  levothyroxine (SYNTHROID) 112 MCG tablet Take 1 tablet (112 mcg total) by mouth every morning. 08/08/21   Loman Brooklyn, FNP  loperamide (IMODIUM A-D) 2 MG tablet Take 1 tablet (2 mg total) by mouth in the morning. Take second dose if needed Patient taking differently: Take 2 mg by mouth as needed. Take second dose if needed 09/14/20   Dettinger, Fransisca Kaufmann, MD  methocarbamol (ROBAXIN) 500 MG tablet Take 1 tablet (500 mg total) by mouth 2 (two) times daily. 10/24/21  Yes Danis Pembleton, PA-C  oxyCODONE-acetaminophen (PERCOCET/ROXICET) 5-325 MG tablet Take 1 tablet by mouth every 6 (six) hours as needed for severe pain. 10/24/21  Yes Cheetara Hoge, PA-C  Pancrelipase, Lip-Prot-Amyl, (CREON) 24000-76000 units CPEP Take 1-2 capsules (24,000-48,000 Units total) by mouth See admin instructions. Take 48,000 units (2 capsules) with meals and 24000 units (1 capsule) with snacks 02/16/21   Annitta Needs, NP  pantoprazole (PROTONIX) 40 MG tablet TAKE ONE TABLET BY MOUTH DAILY BEFORE BREAKFAST 09/29/21   Loman Brooklyn, FNP  potassium chloride SA (KLOR-CON) 20  MEQ tablet Take 1 tablet (20 mEq total) by mouth daily. (NEEDS TO BE SEEN BEFORE NEXT REFILL) 07/28/21   Derek Jack, MD  pravastatin (PRAVACHOL) 40 MG tablet TAKE ONE TABLET AT BEDTIME 08/17/21   Hendricks Limes F, FNP  predniSONE (DELTASONE) 5 MG tablet Take 5 mg by mouth daily. Pt states she takes with severe arthritic pain 04/06/21   [provider]  sulfaDIAZINE 500 MG tablet Take 500 mg by mouth 2 (two) times daily.    [provider]  sulfaSALAzine (AZULFIDINE) 500 MG  tablet 1 tablet 03/17/21   [provider]  traZODone (DESYREL) 100 MG tablet TAKE 1 TABLET AT BEDTIME AS NEEDED FOR SLEEP 05/20/21   Loman Brooklyn, FNP      Allergies    Adalimumab    Review of Systems   Review of Systems  Constitutional:  Negative for chills and fever.  Genitourinary:  Negative for difficulty urinating.  Musculoskeletal:  Positive for arthralgias and back pain.  Neurological:  Negative for weakness and numbness.  All other systems reviewed and are negative.  Physical Exam Updated Vital Signs BP 111/64    Pulse 60    Temp 98 F (36.7 C)    Resp 18    Ht 5\' 4"  (1.626 m)    Wt 54.9 kg    SpO2 100%    BMI 20.77 kg/m  Physical Exam Vitals and nursing note reviewed.  Constitutional:      Appearance: She is not ill-appearing.     Comments: Laying on left hip in bed   HENT:     Head: Normocephalic and atraumatic.  Eyes:     Conjunctiva/sclera: Conjunctivae normal.  Cardiovascular:     Rate and Rhythm: Normal rate and regular rhythm.     Pulses: Normal pulses.  Pulmonary:     Effort: Pulmonary effort is normal.     Breath sounds: Normal breath sounds. No wheezing, rhonchi or rales.  Musculoskeletal:     Comments: No C or T midline spinal TTP. + mild lumbar midline spinal TTP without stepoffs or deformities. No erythema or edema to injection site from 1/18 epidural injection. + mild TTP to right hip however ROM intact in bed without pain illicited. Strength and sensation intact to BLEs. 2+ DP pulses bilaterally.   Skin:    General: Skin is warm and dry.     Coloration: Skin is not jaundiced.  Neurological:     Mental Status: She is alert.    ED Results / Procedures / Treatments   Labs (all labs ordered are listed, but only abnormal results are displayed) Labs Reviewed - No data to display  EKG None  Radiology DG Lumbar Spine Complete  Result Date: 10/24/2021 CLINICAL DATA:  back pain EXAM: LUMBAR SPINE - COMPLETE 4+ VIEW COMPARISON:  April 10, 2021 FINDINGS: There are five non-rib bearing lumbar-type vertebral bodies. 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 This is similar in comparison to prior with asymmetric LEFT-sided disc space narrowing. This is centered at L3-4. No spondylolisthesis. There is no evidence for acute fracture or subluxation. Moderate to severe degenerative changes of L3-4 and moderate degenerative changes of L4-5. Mild degenerative changes of L2-3. Osteopenia. Facet arthropathy. Atherosclerotic calcifications of the aorta. 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 Electronically Signed   By: Valentino Saxon M.D.   On: 10/24/2021 12:36   DG Hip Unilat W or Wo Pelvis 2-3 Views Right  Result  Date: 10/24/2021 CLINICAL DATA:  back pain EXAM: DG HIP (WITH OR WITHOUT PELVIS) 2-3V RIGHT COMPARISON:  None. FINDINGS: Osteopenia. No acute fracture or dislocation. Mild degenerative changes of the RIGHT hip. No area of erosion or osseous destruction. No unexpected radiopaque foreign body. Pelvic phleboliths. Limited assessment of the sacrum secondary to overlapping bowel contents. IMPRESSION: No acute fracture or dislocation. If persistent concern for nondisplaced hip or pelvic fracture, recommend dedicated pelvic MRI. Electronically Signed   By: Valentino Saxon M.D.   On: 10/24/2021 12:34    Procedures Procedures    Medications Ordered in ED Medications  lidocaine (LIDODERM) 5 % 1 patch (1 patch Transdermal Patch Applied 10/24/21 1330)  oxyCODONE-acetaminophen (PERCOCET/ROXICET) 5-325 MG per tablet 1 tablet (1 tablet Oral Given 10/24/21 1201)  methocarbamol (ROBAXIN) tablet 500 mg (500 mg Oral Given 10/24/21 1330)    ED Course/ Medical Decision Making/ A&P                           Medical Decision Making 66 year old female who presents to the ED today with complaint of worsening lower back and right hip pain for the past 2  days.  Recent epidural injection on 1/18.  On arrival to the ED today vitals are stable.  Patient is seen laying on her left hip in bed.  She has very mild midline L spinal tenderness palpation and right hip tenderness palpation.  She is neurovascularly intact throughout.  She has great range of motion of her hip while laying in bed without any pain elicited.  He has no red flag symptoms today concerning for cauda equina, spinal epidural abscess, AAA.  Recent MRI in September with R disc bulge L4-L5, L disc bulge L2-L3/L3-L4  with associated foraminal stenosis bilaterally.  We will plan for x-rays at this time for further evaluation.  PDMP reviewed without suspicious activity.  Will provide Percocet with reevaluation of pain.  Patient will likely ultimately need to follow-up with her orthopedist for further evaluation.  It does appear with the amount of degenerative disease she has in her back they are weary about doing surgery.   Xrays reassuring at this time.  On reevaluation after pain medication and Robaxin patient has been able to ambulate short distance however continues to complain of pain.  Again her pain is chronic in nature.  I have recommended that she follow-up with her orthopedist for further evaluation.  Daughter will call them in the morning.  We will plan to discharge home with short course of Percocet and Robaxin.  Patient advised to pick up over-the-counter Salonpas patches to apply to the area of pain for comfort as well.  She is in agreement with plan and stable for discharge.  Problems Addressed: Acute right-sided low back pain without sciatica: chronic illness or injury with exacerbation, progression, or side effects of treatment  Amount and/or Complexity of Data Reviewed Radiology: ordered.    Details: Xray of R hip negative for acute bony abnormality Xray of L spine with degenerative changes  Risk Prescription drug management.          Final Clinical Impression(s) / ED  Diagnoses Final diagnoses:  Acute right-sided low back pain without sciatica    Rx / DC Orders ED Discharge Orders          Ordered    methocarbamol (ROBAXIN) 500 MG tablet  2 times daily        10/24/21 1432    oxyCODONE-acetaminophen (PERCOCET/ROXICET)  5-325 MG tablet  Every 6 hours PRN        10/24/21 1432             Discharge Instructions      Please pick up pain medication and take as prescribed DO NOT DRIVE OR DRINK ALCOHOL WHILE ON THE MUSCLE RELAXER AS IT CAN MAKE YOU DROWSY  You can also buy salon pas patches OTC to apply to your area of pain  Follow up with Dr. Ernestina Patches for further evaluation of your pain  Return to the ED for any new/worsening symptoms        Eustaquio Maize, PA-C 10/24/21 1433    Horton, Alvin Critchley, DO 10/24/21 1601

## 2021-10-24 NOTE — ED Notes (Addendum)
Pt was able to stand up at bedside and take a few steps without assistance.  Continues to have pain.

## 2021-10-24 NOTE — ED Triage Notes (Signed)
Patient complaining of chronic right hip pain that worsened yesterday. Patient was seen two weeks ago for treatment.

## 2021-10-24 NOTE — Discharge Instructions (Signed)
Please pick up pain medication and take as prescribed DO NOT DRIVE OR DRINK ALCOHOL WHILE ON THE MUSCLE RELAXER AS IT CAN MAKE YOU DROWSY  You can also buy salon pas patches OTC to apply to your area of pain  Follow up with Dr. Ernestina Patches for further evaluation of your pain  Return to the ED for any new/worsening symptoms

## 2021-10-25 ENCOUNTER — Other Ambulatory Visit: Payer: Self-pay | Admitting: Physical Medicine and Rehabilitation

## 2021-10-25 ENCOUNTER — Telehealth: Payer: Self-pay | Admitting: Physical Medicine and Rehabilitation

## 2021-10-25 NOTE — Telephone Encounter (Signed)
Pt returned call.   CB [phone]

## 2021-10-25 NOTE — Telephone Encounter (Signed)
Pt's daughter Marguarite Arbour called requesting a call back from [place]. She did not state the nature of the reasoning for call. Please call at [phone]

## 2021-10-26 ENCOUNTER — Other Ambulatory Visit: Payer: Self-pay

## 2021-10-26 ENCOUNTER — Ambulatory Visit (INDEPENDENT_AMBULATORY_CARE_PROVIDER_SITE_OTHER): Payer: 59 | Admitting: Physical Medicine and Rehabilitation

## 2021-10-26 ENCOUNTER — Encounter (HOSPITAL_COMMUNITY): Payer: Self-pay

## 2021-10-26 ENCOUNTER — Encounter: Payer: Self-pay | Admitting: Physical Medicine and Rehabilitation

## 2021-10-26 ENCOUNTER — Other Ambulatory Visit (HOSPITAL_COMMUNITY): Payer: Self-pay | Admitting: *Deleted

## 2021-10-26 DIAGNOSIS — M25551 Pain in right hip: Secondary | ICD-10-CM

## 2021-10-26 DIAGNOSIS — M5116 Intervertebral disc disorders with radiculopathy, lumbar region: Secondary | ICD-10-CM

## 2021-10-26 DIAGNOSIS — M47819 Spondylosis without myelopathy or radiculopathy, site unspecified: Secondary | ICD-10-CM | POA: Diagnosis not present

## 2021-10-26 DIAGNOSIS — M5416 Radiculopathy, lumbar region: Secondary | ICD-10-CM | POA: Diagnosis not present

## 2021-10-26 DIAGNOSIS — M48061 Spinal stenosis, lumbar region without neurogenic claudication: Secondary | ICD-10-CM

## 2021-10-26 DIAGNOSIS — G894 Chronic pain syndrome: Secondary | ICD-10-CM | POA: Diagnosis not present

## 2021-10-26 MED ORDER — HYDROCODONE-ACETAMINOPHEN 5-325 MG PO TABS
1.0000 | ORAL_TABLET | Freq: Three times a day (TID) | ORAL | 0 refills | Status: AC | PRN
Start: 2021-10-26 — End: 2021-11-02

## 2021-10-26 NOTE — Progress Notes (Signed)
Pt state right hip and leg pain. Pt state she can't walking or stand. Pt state she takes pain meds to help ease help ease her pain. Pt state with out to eat Friday and Saturday she couldn't move then Sunday went to the hospital.  Numeric Pain Rating Scale and Functional Assessment Average Pain 10 Pain Right Now 10 My pain is constant, sharp, and stabbing Pain is worse with: walking, standing, and some activites Pain improves with: medication   In the last MONTH (on 0-10 scale) has pain interfered with the following?  1. General activity like being  able to carry out your everyday physical activities such as walking, climbing stairs, carrying groceries, or moving a chair?  Rating(9)  2. Relation with others like being able to carry out your usual social activities and roles such as  activities at home, at work and in your community. Rating(9)  3. Enjoyment of life such that you have  been bothered by emotional problems such as feeling anxious, depressed or irritable?  Rating(9)

## 2021-10-26 NOTE — Progress Notes (Signed)
Erica Beasley - 66 y.o. female MRN [id number]  Date of birth: [birth year]  Office Visit Note: Visit Date: 10/26/2021 PCP: Loman Brooklyn, FNP Referred by: Loman Brooklyn, FNP  Subjective: Chief Complaint  Patient presents with   Right Hip - Pain   Right Leg - Pain   HPI: TRECIA Beasley is a 66 y.o. female who comes in today for evaluation of chronic, worsening and severe right sided lower back pain radiating to buttock, lateral thigh and down leg. Patient is accompanied by her daughter during our visit today.  Patient states her pain became excruciating this past Saturday morning, she was unable to manage her pain at home and did seek care in the emergency room on Sunday.  Patient had lumbar spine imaging completed while in the emergency department the did not exhibit any new findings, patient was discharged home with a short course of Percocet and Robaxin and instructed to follow-up with [place] in the office.  Patient states her pain is considerably worse at this time is exacerbated by movement and activity, she describes her pain as a constant soreness sensation and currently rates a 10 out of 10.  Patient states nothing seems to help alleviate her pain at this time. Patient's recent lumbar MRI exhibits multi-level degenerative changes and right eccentric disc bulge with facet hypertrophy at L4-5 with resultant severe right and moderate left L4 foraminal stenosis. No high grade spinal canal stenosis noted.  Daughter reports that patient has been under an extreme amount of stress.  Patient recently moved in with her daughter and placed her childhood home up for sale.  Daughter also voiced concerns about recent cognitive changes such as forgetfulness and thinks that these changes and increased stress could be contributing to her mother's extreme pain.  Patient states she has to have something done soon because her pain is so severe that she is unable to perform daily tasks.  Patient states her pain is  making it difficult for her to ambulate and she is now using a wheelchair to get around at home.  Patient denies focal weakness, numbness and tingling.  Patient denies recent trauma or falls.    Review of Systems  Musculoskeletal:  Positive for back pain.  Neurological:  Negative for tingling, sensory change, focal weakness and weakness.  All other systems reviewed and are negative. Otherwise per HPI.  Assessment & Plan: Visit Diagnoses:    ICD-10-CM   1. Lumbar radiculopathy  M54.16 Ambulatory referral to Orthopedic Surgery    2. Intervertebral disc disorders with radiculopathy, lumbar region  M51.16 Ambulatory referral to Orthopedic Surgery    3. Foraminal stenosis of lumbar region  M48.061     4. Facet arthropathy  M47.819     5. Chronic pain syndrome  G89.4 Trigger Point Inj       Plan: Findings:  Chronic, worsening and severe right-sided lower back pain radiating to buttock, lateral thigh and down leg.  Patient continues to have excruciating and debilitating pain despite good conservative therapies such as formal physical therapy/chiropractic treatments and use of medications.  Patient's clinical presentation and exam are consistent with right L4/L5 nerve pattern.  Patient has a history of multiple lumbar epidural steroid injections performed by Dr. Magnus Sinning with minimal relief of pain. Patient recently had to seek care in the emergency department due to increased pain that she was unable to manage at home.  We did speak with patient in detail today regarding the next steps of treatment  and other possible modalities to manage her pain.  Patient was given an intramuscular Toradol injection in the office today without difficulty she was also prescribed a short course of Norco to take as needed for moderate/severe pain.  We also placed a referral to our in-house orthopedic spine surgeon Dr. Basil Dess for surgical consultation and to discuss possible treatment options.  She may need  to follow-up with her primary care physician for possible referral to multimodal comprehensive pain management with behavioral health evaluation.  We instructed patient and daughter to let us know if her pain increases or changes in nature we are happy to refill her pain medication as she is waiting for surgical consultation.  No red flag symptoms noted upon exam today.   Meds & Orders:  Meds ordered this encounter  Medications   HYDROcodone-acetaminophen (NORCO/VICODIN) 5-325 MG tablet    Sig: Take 1 tablet by mouth every 8 (eight) hours as needed for up to 7 days for moderate pain or severe pain.    Dispense:  21 tablet    Refill:  0    Order Specific Question:   Supervising Provider    Answer:   Magnus Sinning [[id number]]    Orders Placed This Encounter  Procedures   Trigger Point Inj   Ambulatory referral to Orthopedic Surgery    Follow-up: Return if symptoms worsen or fail to improve.   Procedures: Trigger Point Inj  Date/Time: 10/26/2021 11:53 AM Performed by: Lorine Bears, NP Authorized by: Lorine Bears, NP   Consent Given by:  Patient Indications:  Pain Total # of Trigger Points:  1 Needle Size:  22 G Medications #1:  30 mg ketorolac 30 MG/ML Patient tolerance:  Patient tolerated the procedure well with no immediate complications Comments: IM injection to left deltoid.       Clinical History: EXAM: MRI LUMBAR SPINE WITHOUT CONTRAST   TECHNIQUE: Multiplanar, multisequence MR imaging of the lumbar spine was performed. No intravenous contrast was administered.   COMPARISON:  Radiograph from 04/10/2021.   FINDINGS: Segmentation: Standard. Lowest well-formed disc space labeled the L5-S1 level.   Alignment: Moderate dextroscoliosis with apex at L3. Trace retrolisthesis of T11 on T12 and L1 on L2.   Vertebrae: Vertebral body height maintained without acute or chronic fracture. Bone marrow signal intensity within normal limits. Benign hemangioma noted  within the right pedicle of L1. No other discrete or worrisome osseous lesions. Prominent discogenic reactive endplate change present about the L3-4 and L4-5 interspaces related to underlying scoliotic curvature. Mild reactive marrow edema also noted about the left L4-5 facet due to facet arthritis.   Conus medullaris and cauda equina: Conus extends to the T12-L1 level. Conus and cauda equina appear normal.   Paraspinal and other soft tissues: Unremarkable.   Disc levels:   T11-12: Seen only on sagittal projection. Disc bulge with disc desiccation with mild reactive endplate change. Mild facet hypertrophy. No significant stenosis.   T12-L1: Unremarkable.   L1-2: Mild disc bulge with disc desiccation and reactive endplate spurring. Disc bulging asymmetric to the right. Superimposed small central disc protrusion minimally indents the ventral thecal sac. Mild facet hypertrophy. No spinal stenosis. Mild right L1 foraminal narrowing. Left neural foramina remains patent.   L2-3: Degenerative intervertebral disc space narrowing with diffuse disc bulge and disc desiccation. Disc bulging eccentric to the left with associated left foraminal to extraforaminal disc protrusion (series 8, image 18). Mild facet and ligament flavum hypertrophy. Mild narrowing of the left lateral recess. Central  canal remains patent. Mild to moderate left L2 foraminal narrowing. Right neural foramina remains patent.   L3-4: Degenerative intervertebral disc space narrowing with disc desiccation and diffuse disc bulge, asymmetric to the left. Prominent reactive endplate spurring with marrow edema, also greater on the left. Resultant large extraforaminal disc osteophyte complex (series 7, image 23). Moderate left worse than right facet hypertrophy. Resultant mild narrowing of the left lateral recess. Central canal remains patent. Moderate left L3 foraminal stenosis. Right neural foramina remains patent.   L4-5:  Degenerative intervertebral disc space narrowing with disc desiccation and diffuse disc bulge. Associated discogenic reactive endplate change with marginal endplate osteophytic spurring. Disc bulging eccentric to the right. Superimposed mild to moderate facet hypertrophy. Resultant mild to moderate right lateral recess narrowing. Central canal remains patent. Severe right with moderate left L4 foraminal stenosis (series 5, image 7).   L5-S1:  Normal interspace.  Mild facet hypertrophy.  No stenosis.   IMPRESSION: 1. 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 Query right L4 radiculitis. 2. Left eccentric disc bulge and reactive endplate changes at Z3-6 with resultant moderate left L3 foraminal stenosis. 3. Left eccentric disc bulge with foraminal disc protrusion at L2-3 with resultant mild to moderate left lateral recess and foraminal stenosis. 4. Underlying moderate dextroscoliosis.     Electronically Signed   By: Jeannine Boga M.D.   On: 06/21/2021 03:28   She reports that she quit smoking about 4 years ago. Her smoking use included cigarettes. She has a 15.00 pack-year smoking history. She has quit using smokeless tobacco.  Recent Labs    11/13/20 1122 02/16/21 0951 08/06/21 1431  HGBA1C 6.4 6.3 5.9*    Objective:  VS:  HT:     WT:    BMI:      BP:    HR: bpm   TEMP: ( )   RESP:  Physical Exam Vitals and nursing note reviewed.  HENT:     Head: Normocephalic and atraumatic.     Right Ear: External ear normal.     Left Ear: External ear normal.     Nose: Nose normal.     Mouth/Throat:     Mouth: Mucous membranes are moist.  Eyes:     Extraocular Movements: Extraocular movements intact.  Cardiovascular:     Rate and Rhythm: Normal rate.     Pulses: Normal pulses.  Pulmonary:     Effort: Pulmonary effort is normal.  Abdominal:     General: Abdomen is flat. There is no  distension.  Musculoskeletal:        General: Tenderness present.     Cervical back: Normal range of motion.     Comments: Pt is slow to rise from seated position to standing. Good lumbar range of motion. Strong distal strength without clonus, no pain upon palpation of greater trochanters. Sensation intact bilaterally. Dysesthesias noted to right L4/L5 dermatomes. Using wheelchair to assist with ambulation and prevent falls.  Skin:    General: Skin is warm and dry.     Capillary Refill: Capillary refill takes less than 2 seconds.  Neurological:     Mental Status: She is alert and oriented to person, place, and time.     Gait: Gait abnormal.  Psychiatric:        Mood and Affect: Mood normal.    Ortho Exam  Imaging: No results found.  Past Medical/Family/Surgical/Social History: Medications & Allergies reviewed per EMR, new  medications updated. Patient Active Problem List   Diagnosis Date Noted   Hypothyroidism 11/13/2020   Prediabetes 11/13/2020   B12 deficiency 11/13/2020   COPD without exacerbation ([place]) 11/13/2020   Vitamin D deficiency 11/13/2020   Neuropathy 11/13/2020   Rheumatoid arthritis involving multiple sites ([place]) 11/13/2020   Mixed hyperlipidemia 11/13/2020   Aortic atherosclerosis ([place]) 11/13/2020   GERD (gastroesophageal reflux disease) 11/05/2020   Depression, recurrent ([place]) 09/22/2020   Essential hypertension 09/22/2020   Dehydration 08/13/2020   Hypokalemia 08/13/2020   Abnormal CT of the abdomen 06/02/2020   N&V (nausea and vomiting) 04/08/2020   Monoclonal gammopathy 04/03/2020   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 03/26/2020   Hx of colonic polyps 05/03/2019   H/O Clostridium difficile infection 05/03/2019   Normocytic anemia 05/03/2019   Chronic diarrhea 01/29/2019   Past Medical History:  Diagnosis Date   Aortic atherosclerosis ([place]) 11/13/2020   CKD (chronic kidney disease) stage 2, GFR 60-89 ml/min    COPD (chronic  obstructive pulmonary disease) ([place])    COVID-19 06/2019   Depression    Essential hypertension    GERD (gastroesophageal reflux disease)    History of diabetes mellitus    Hyperlipidemia    Hypothyroidism    Neuropathy    Rheumatoid arthritis ([facility])    Vitamin B12 deficiency    Vitamin D deficiency    Family History  Problem Relation Age of Onset   Stroke Mother    Heart disease Mother    Diabetes Mother    Breast cancer Mother    Other Father        stomach taken out for some reason   Migraines Daughter    Colon cancer Neg Hx    Past Surgical History:  Procedure Laterality Date   BIOPSY  09/16/2019   Procedure: BIOPSY;  Surgeon: Daneil Dolin, MD;  Location: [facility];  Service: Endoscopy;;   BIOPSY  04/28/2020   Procedure: BIOPSY;  Surgeon: Daneil Dolin, MD;  Location: [facility];  Service: Endoscopy;;   BIOPSY  06/08/2020   Procedure: BIOPSY;  Surgeon: Daneil Dolin, MD;  [facility]: [facility];  Service: Endoscopy;;   CATARACT EXTRACTION Bilateral    COLONOSCOPY  09/2010   Dr. Posey Pronto: mild diverticulsis in sigmoid colon.    COLONOSCOPY WITH PROPOFOL N/A 09/16/2019   Rourk: Diverticulosis, random colon biopsies negative for microscopic colitis.   COLONOSCOPY WITH PROPOFOL N/A 06/08/2020   Procedure: COLONOSCOPY WITH PROPOFOL;  Surgeon: Daneil Dolin, MD;  [facility]: [facility];  Service: Endoscopy;  Laterality: N/A;  9:15am   ESOPHAGOGASTRODUODENOSCOPY (EGD) WITH PROPOFOL N/A 04/28/2020   Procedure: ESOPHAGOGASTRODUODENOSCOPY (EGD) WITH PROPOFOL;  Surgeon: Daneil Dolin, MD;  [facility]: [facility];  Service: Endoscopy;  Laterality: N/A;  5:95   YAG LASER APPLICATION Left 63/87/5643   Procedure: YAG LASER APPLICATION;  Surgeon:  Che, MD;  Location: [facility];  Service: Ophthalmology;  Laterality: Left;   Social History   Occupational History   Occupation: DISABLED  Tobacco Use   Smoking status: Former    Packs/day: 1.00    Years: 15.00     Pack years: 15.00    Types: Cigarettes    Quit date: 09/11/2017    Years since quitting: 4.1   Smokeless tobacco: Former  Scientific laboratory technician Use: Never used  Substance and Sexual Activity   Alcohol use: Never   Drug use: Never   Sexual activity: Not Currently

## 2021-10-27 ENCOUNTER — Other Ambulatory Visit (HOSPITAL_COMMUNITY): Payer: Self-pay | Admitting: *Deleted

## 2021-10-27 DIAGNOSIS — F339 Major depressive disorder, recurrent, unspecified: Secondary | ICD-10-CM

## 2021-10-27 MED ORDER — KETOROLAC TROMETHAMINE 30 MG/ML IJ SOLN
30.0000 mg | INTRAMUSCULAR | Status: AC | PRN
  Administered 2021-10-26: 30 mg via INTRAMUSCULAR

## 2021-10-27 MED ORDER — CITALOPRAM HYDROBROMIDE 40 MG PO TABS: 40.0000 mg | ORAL_TABLET | Freq: Every day | ORAL | 5 refills | Status: DC

## 2021-10-27 NOTE — Progress Notes (Signed)
I was actively involved in the evaluation and management and development of the patient's ongoing medical plan today.  I do agree with the plan detailed in Barnet Pall, FNP note.

## 2021-11-01 ENCOUNTER — Other Ambulatory Visit: Payer: Self-pay | Admitting: Family Medicine

## 2021-11-01 DIAGNOSIS — E1165 Type 2 diabetes mellitus with hyperglycemia: Secondary | ICD-10-CM

## 2021-11-01 DIAGNOSIS — I10 Essential (primary) hypertension: Secondary | ICD-10-CM

## 2021-11-04 ENCOUNTER — Ambulatory Visit: Payer: 59 | Admitting: Physical Medicine and Rehabilitation

## 2021-11-05 ENCOUNTER — Other Ambulatory Visit: Payer: Self-pay | Admitting: Family Medicine

## 2021-11-05 ENCOUNTER — Ambulatory Visit: Payer: Medicaid Other | Admitting: Nurse Practitioner

## 2021-11-05 DIAGNOSIS — E039 Hypothyroidism, unspecified: Secondary | ICD-10-CM

## 2021-11-16 ENCOUNTER — Encounter (HOSPITAL_COMMUNITY): Payer: Medicaid Other

## 2021-11-19 ENCOUNTER — Other Ambulatory Visit: Payer: Self-pay | Admitting: Family Medicine

## 2021-11-19 DIAGNOSIS — E782 Mixed hyperlipidemia: Secondary | ICD-10-CM

## 2021-11-19 DIAGNOSIS — E1165 Type 2 diabetes mellitus with hyperglycemia: Secondary | ICD-10-CM

## 2021-11-19 DIAGNOSIS — I700 Atherosclerosis of aorta: Secondary | ICD-10-CM

## 2021-11-24 ENCOUNTER — Other Ambulatory Visit: Payer: Self-pay | Admitting: Family Medicine

## 2021-11-24 DIAGNOSIS — F339 Major depressive disorder, recurrent, unspecified: Secondary | ICD-10-CM

## 2021-11-24 NOTE — Telephone Encounter (Signed)
Last office visit 08/06/21 ?Last refill 05/20/21, #90, 1 refill ?

## 2021-11-25 ENCOUNTER — Other Ambulatory Visit: Payer: Self-pay

## 2021-11-25 ENCOUNTER — Ambulatory Visit: Payer: Self-pay

## 2021-11-25 ENCOUNTER — Ambulatory Visit (INDEPENDENT_AMBULATORY_CARE_PROVIDER_SITE_OTHER): Payer: 59 | Admitting: Specialist

## 2021-11-25 ENCOUNTER — Encounter: Payer: Self-pay | Admitting: Specialist

## 2021-11-25 VITALS — BP 134/69 | HR 84 | Ht 64.0 in | Wt 120.0 lb

## 2021-11-25 DIAGNOSIS — M4156 Other secondary scoliosis, lumbar region: Secondary | ICD-10-CM | POA: Diagnosis not present

## 2021-11-25 DIAGNOSIS — M7918 Myalgia, other site: Secondary | ICD-10-CM

## 2021-11-25 DIAGNOSIS — M545 Low back pain, unspecified: Secondary | ICD-10-CM | POA: Diagnosis not present

## 2021-11-25 DIAGNOSIS — M1711 Unilateral primary osteoarthritis, right knee: Secondary | ICD-10-CM

## 2021-11-25 DIAGNOSIS — M4726 Other spondylosis with radiculopathy, lumbar region: Secondary | ICD-10-CM

## 2021-11-25 MED ORDER — METHYLPREDNISOLONE ACETATE 40 MG/ML IJ SUSP
40.0000 mg | INTRAMUSCULAR | Status: AC | PRN
  Administered 2021-11-25: 40 mg via INTRA_ARTICULAR

## 2021-11-25 MED ORDER — HYDROCODONE-ACETAMINOPHEN 7.5-325 MG PO TABS
1.0000 | ORAL_TABLET | Freq: Four times a day (QID) | ORAL | 0 refills | Status: DC | PRN
Start: 2021-11-25 — End: 2022-01-28

## 2021-11-25 MED ORDER — BUPIVACAINE HCL 0.5 % IJ SOLN
3.0000 mL | INTRAMUSCULAR | Status: AC | PRN
  Administered 2021-11-25: 3 mL via INTRA_ARTICULAR

## 2021-11-25 NOTE — Patient Instructions (Addendum)
Avoid bending, stooping and avoid lifting weights greater than 10 lbs. ?Avoid prolong standing and walking. ?Avoid frequent bending and stooping  ?No lifting greater than 10 lbs. ?May use ice or moist heat for pain. ?Weight loss is of benefit. ?Handicap license is approved. ?Hydrocodone for pain ?MRI Lumbar spine repeat due to [facility] not of benefit, worsening spine and buttock pain with history of ?Monogammopathy, rheumatoid arthritis, osteoporosis. Pain post ESI is worse and severe pai with ESI. ?

## 2021-11-25 NOTE — Progress Notes (Signed)
Office Visit Note   Patient: Erica Beasley           Date of Birth: 1956-04-20           MRN: [id number] Visit Date: 11/25/2021              Requested by: Lorine Bears, NP [place],  [place] [address] PCP: Loman Brooklyn, FNP   Assessment & Plan: Visit Diagnoses:  1. Low back pain, unspecified back pain laterality, unspecified chronicity, unspecified whether sciatica present   2. Unilateral primary osteoarthritis, right knee   3. Other spondylosis with radiculopathy, lumbar region   4. Other secondary scoliosis, lumbar region   5. Pain in right buttock     Plan: Avoid bending, stooping and avoid lifting weights greater than 10 lbs. Avoid prolong standing and walking. Avoid frequent bending and stooping  No lifting greater than 10 lbs. May use ice or moist heat for pain. Weight loss is of benefit. Handicap license is approved. Hydrocodone for pain MRI Lumbar spine repeat due to [facility] not of benefit, worsening spine and buttock pain with history of Monogammopathy, rheumatoid arthritis, osteoporosis. Pain post ESI is worse and severe pai with ESI.  Follow-Up Instructions: No follow-ups on file.   Orders:  Orders Placed This Encounter  Procedures   XR Lumb Spine Flex&Ext Only   Meds ordered this encounter  Medications   HYDROcodone-acetaminophen (NORCO) 7.5-325 MG tablet    Sig: Take 1 tablet by mouth every 6 (six) hours as needed for moderate pain.    Dispense:  30 tablet    Refill:  0      Procedures: Large Joint Inj: R knee on 11/25/2021 3:51 PM Indications: pain Details: 25 G 1.5 in needle, anteromedial approach  Arthrogram: No  Medications: 40 mg methylPREDNISolone acetate 40 MG/ML; 3 mL bupivacaine 0.5 % Outcome: tolerated well, no immediate complications  Bandaid Procedure, treatment alternatives, risks and benefits explained, specific risks discussed. Consent was given by the patient. Immediately prior to procedure a time out was called  to verify the correct patient, procedure, equipment, support staff and site/side marked as required. Patient was prepped and draped in the usual sterile fashion.     Clinical Data: Findings:  CLINICAL DATA:  Initial evaluation for low back pain with radiation into the right lower extremity.   EXAM: MRI LUMBAR SPINE WITHOUT CONTRAST   TECHNIQUE: Multiplanar, multisequence MR imaging of the lumbar spine was performed. No intravenous contrast was administered.   COMPARISON:  Radiograph from 04/10/2021.   FINDINGS: Segmentation: Standard. Lowest well-formed disc space labeled the L5-S1 level.   Alignment: Moderate dextroscoliosis with apex at L3. Trace retrolisthesis of T11 on T12 and L1 on L2.   Vertebrae: Vertebral body height maintained without acute or chronic fracture. Bone marrow signal intensity within normal limits. Benign hemangioma noted within the right pedicle of L1. No other discrete or worrisome osseous lesions. Prominent discogenic reactive endplate change present about the L3-4 and L4-5 interspaces related to underlying scoliotic curvature. Mild reactive marrow edema also noted about the left L4-5 facet due to facet arthritis.   Conus medullaris and cauda equina: Conus extends to the T12-L1 level. Conus and cauda equina appear normal.   Paraspinal and other soft tissues: Unremarkable.   Disc levels:   T11-12: Seen only on sagittal projection. Disc bulge with disc desiccation with mild reactive endplate change. Mild facet hypertrophy. No significant stenosis.   T12-L1: Unremarkable.   L1-2: Mild disc bulge with  disc desiccation and reactive endplate spurring. Disc bulging asymmetric to the right. Superimposed small central disc protrusion minimally indents the ventral thecal sac. Mild facet hypertrophy. No spinal stenosis. Mild right L1 foraminal narrowing. Left neural foramina remains patent.   L2-3: Degenerative intervertebral disc space narrowing with  diffuse disc bulge and disc desiccation. Disc bulging eccentric to the left with associated left foraminal to extraforaminal disc protrusion (series 8, image 18). Mild facet and ligament flavum hypertrophy. Mild narrowing of the left lateral recess. Central canal remains patent. Mild to moderate left L2 foraminal narrowing. Right neural foramina remains patent.   L3-4: Degenerative intervertebral disc space narrowing with disc desiccation and diffuse disc bulge, asymmetric to the left. Prominent reactive endplate spurring with marrow edema, also greater on the left. Resultant large extraforaminal disc osteophyte complex (series 7, image 23). Moderate left worse than right facet hypertrophy. Resultant mild narrowing of the left lateral recess. Central canal remains patent. Moderate left L3 foraminal stenosis. Right neural foramina remains patent.   L4-5: Degenerative intervertebral disc space narrowing with disc desiccation and diffuse disc bulge. Associated discogenic reactive endplate change with marginal endplate osteophytic spurring. Disc bulging eccentric to the right. Superimposed mild to moderate facet hypertrophy. Resultant mild to moderate right lateral recess narrowing. Central canal remains patent. Severe right with moderate left L4 foraminal stenosis (series 5, image 7).   L5-S1:  Normal interspace.  Mild facet hypertrophy.  No stenosis.   IMPRESSION: 1. 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 Query right L4 radiculitis. 2. Left eccentric disc bulge and reactive endplate changes at B2-6 with resultant moderate left L3 foraminal stenosis. 3. Left eccentric disc bulge with foraminal disc protrusion at L2-3 with resultant mild to moderate left lateral recess and foraminal stenosis. 4. Underlying moderate dextroscoliosis.     Electronically Signed   By: Jeannine Boga M.D.    On: 06/21/2021 03:28     Subjective: Chief Complaint  Patient presents with   Lower Back - Pain    66 year old female with history of back pain and the back giving out. In the past she has had severe back pain and was bent over, saw a chiropractor and had an ESI and was able to stand up. This time she has had pain that started Jan28th. Reportedly was fine and went out to eat pizza the day before. Then that Sunday she had severe pain and difficulty standing and walking. The legs are okay and in the bed she has relief of pain and she has been at bed rest for about 5 weeks. She isunable to eat due to pain in the right hip and buttock. She relates that she has lost about 15 lbs. She can not eat well with arthritis in her hands.  She is not taking pain meds or arthritis med. In the past was given hydrocodone and toradol without help.    Review of Systems  Constitutional: Negative.   HENT: Negative.    Eyes: Negative.   Respiratory: Negative.    Cardiovascular: Negative.   Gastrointestinal: Negative.   Endocrine: Negative.   Genitourinary: Negative.   Musculoskeletal: Negative.   Skin: Negative.   Allergic/Immunologic: Negative.   Neurological: Negative.   Hematological: Negative.   Psychiatric/Behavioral: Negative.      Objective: Vital Signs: BP 134/69 (BP Location: Left Arm, Patient Position: Sitting)    Pulse 84    Ht 5\' 4"  (1.626 m)  Wt 120 lb (54.4 kg)    BMI 20.60 kg/m   Physical Exam Constitutional:      Appearance: She is well-developed.  HENT:     Head: Normocephalic and atraumatic.  Eyes:     Pupils: Pupils are equal, round, and reactive to light.  Pulmonary:     Effort: Pulmonary effort is normal.     Breath sounds: Normal breath sounds.  Abdominal:     General: Bowel sounds are normal.     Palpations: Abdomen is soft.  Musculoskeletal:     Cervical back: Normal range of motion and neck supple.     Lumbar back: Negative right straight leg raise test and  negative left straight leg raise test.  Skin:    General: Skin is warm and dry.  Neurological:     Mental Status: She is alert and oriented to person, place, and time.  Psychiatric:        Behavior: Behavior normal.        Thought Content: Thought content normal.        Judgment: Judgment normal.    Back Exam   Tenderness  The patient is experiencing tenderness in the lumbar.  Range of Motion  Extension:  abnormal  Flexion:  abnormal  Lateral bend right:  abnormal  Lateral bend left:  abnormal  Rotation right:  abnormal  Rotation left:  abnormal   Tests  Straight leg raise right: negative Straight leg raise left: negative  Other  Toe walk: normal Heel walk: normal Sensation: normal Gait: normal  Erythema: no back redness Scars: absent    Specialty Comments:  No specialty comments available.  Imaging: No results found.   PMFS History: Patient Active Problem List   Diagnosis Date Noted   Hypothyroidism 11/13/2020   Prediabetes 11/13/2020   B12 deficiency 11/13/2020   COPD without exacerbation ([place]) 11/13/2020   Vitamin D deficiency 11/13/2020   Neuropathy 11/13/2020   Rheumatoid arthritis involving multiple sites ([place]) 11/13/2020   Mixed hyperlipidemia 11/13/2020   Aortic atherosclerosis ([place]) 11/13/2020   GERD (gastroesophageal reflux disease) 11/05/2020   Depression, recurrent ([place]) 09/22/2020   Essential hypertension 09/22/2020   Dehydration 08/13/2020   Hypokalemia 08/13/2020   Abnormal CT of the abdomen 06/02/2020   N&V (nausea and vomiting) 04/08/2020   Monoclonal gammopathy 04/03/2020   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 03/26/2020   Hx of colonic polyps 05/03/2019   H/O Clostridium difficile infection 05/03/2019   Normocytic anemia 05/03/2019   Chronic diarrhea 01/29/2019   Past Medical History:  Diagnosis Date   Aortic atherosclerosis ([place]) 11/13/2020   CKD (chronic kidney disease) stage 2, GFR 60-89 ml/min    COPD  (chronic obstructive pulmonary disease) ([place])    COVID-19 06/2019   Depression    Essential hypertension    GERD (gastroesophageal reflux disease)    History of diabetes mellitus    Hyperlipidemia    Hypothyroidism    Neuropathy    Rheumatoid arthritis ([facility])    Vitamin B12 deficiency    Vitamin D deficiency     Family History  Problem Relation Age of Onset   Stroke Mother    Heart disease Mother    Diabetes Mother    Breast cancer Mother    Other Father        stomach taken out for some reason   Migraines Daughter    Colon cancer Neg Hx     Past Surgical History:  Procedure Laterality Date   BIOPSY  09/16/2019  Procedure: BIOPSY;  Surgeon: Daneil Dolin, MD;  [facility];  Service: Endoscopy;;   BIOPSY  04/28/2020   Procedure: BIOPSY;  Surgeon: Daneil Dolin, MD;  [facility];  Service: Endoscopy;;   BIOPSY  06/08/2020   Procedure: BIOPSY;  Surgeon: Daneil Dolin, MD;  [facility];  Service: Endoscopy;;   CATARACT EXTRACTION Bilateral    COLONOSCOPY  09/2010   Dr. Posey Pronto: mild diverticulsis in sigmoid colon.    COLONOSCOPY WITH PROPOFOL N/A 09/16/2019   Rourk: Diverticulosis, random colon biopsies negative for microscopic colitis.   COLONOSCOPY WITH PROPOFOL N/A 06/08/2020   Procedure: COLONOSCOPY WITH PROPOFOL;  Surgeon: Daneil Dolin, MD;  [facility];  Service: Endoscopy;  Laterality: N/A;  9:15am   ESOPHAGOGASTRODUODENOSCOPY (EGD) WITH PROPOFOL N/A 04/28/2020   Procedure: ESOPHAGOGASTRODUODENOSCOPY (EGD) WITH PROPOFOL;  Surgeon: Daneil Dolin, MD;  [facility];  Service: Endoscopy;  Laterality: N/A;  0:25   YAG LASER APPLICATION Left 85/27/7824   Procedure: YAG LASER APPLICATION;  Surgeon: Williams Che, MD;  Location: [facility];  Service: Ophthalmology;  Laterality: Left;   Social History   Occupational History   Occupation: DISABLED  Tobacco Use   Smoking status: Former    Packs/day: 1.00     Years: 15.00    Pack years: 15.00    Types: Cigarettes    Quit date: 09/11/2017    Years since quitting: 4.2   Smokeless tobacco: Former  Scientific laboratory technician Use: Never used  Substance and Sexual Activity   Alcohol use: Never   Drug use: Never   Sexual activity: Not Currently

## 2021-11-30 ENCOUNTER — Other Ambulatory Visit: Payer: Self-pay | Admitting: Family Medicine

## 2021-11-30 ENCOUNTER — Encounter: Payer: Self-pay | Admitting: Family Medicine

## 2021-11-30 DIAGNOSIS — E039 Hypothyroidism, unspecified: Secondary | ICD-10-CM

## 2021-11-30 NOTE — Telephone Encounter (Signed)
Erica Beasley. NTBS for labwork only 30 days given 11/05/21 ?

## 2021-12-01 ENCOUNTER — Inpatient Hospital Stay (HOSPITAL_COMMUNITY): Payer: 59 | Attending: Hematology

## 2021-12-01 DIAGNOSIS — E1122 Type 2 diabetes mellitus with diabetic chronic kidney disease: Secondary | ICD-10-CM | POA: Diagnosis not present

## 2021-12-01 DIAGNOSIS — M069 Rheumatoid arthritis, unspecified: Secondary | ICD-10-CM | POA: Insufficient documentation

## 2021-12-01 DIAGNOSIS — R634 Abnormal weight loss: Secondary | ICD-10-CM | POA: Diagnosis not present

## 2021-12-01 DIAGNOSIS — D649 Anemia, unspecified: Secondary | ICD-10-CM | POA: Diagnosis not present

## 2021-12-01 DIAGNOSIS — E876 Hypokalemia: Secondary | ICD-10-CM | POA: Insufficient documentation

## 2021-12-01 DIAGNOSIS — F32A Depression, unspecified: Secondary | ICD-10-CM | POA: Insufficient documentation

## 2021-12-01 DIAGNOSIS — Z87891 Personal history of nicotine dependence: Secondary | ICD-10-CM | POA: Insufficient documentation

## 2021-12-01 DIAGNOSIS — D472 Monoclonal gammopathy: Secondary | ICD-10-CM | POA: Diagnosis not present

## 2021-12-01 DIAGNOSIS — Z79899 Other long term (current) drug therapy: Secondary | ICD-10-CM | POA: Diagnosis not present

## 2021-12-01 DIAGNOSIS — E538 Deficiency of other specified B group vitamins: Secondary | ICD-10-CM | POA: Diagnosis not present

## 2021-12-01 LAB — COMPREHENSIVE METABOLIC PANEL
ALT: 6 U/L (ref 0–44)
AST: 11 U/L — ABNORMAL LOW (ref 15–41)
Albumin: 2.9 g/dL — ABNORMAL LOW (ref 3.5–5.0)
Alkaline Phosphatase: 100 U/L (ref 38–126)
Anion gap: 9 (ref 5–15)
BUN: 8 mg/dL (ref 8–23)
CO2: 24 mmol/L (ref 22–32)
Calcium: 9 mg/dL (ref 8.9–10.3)
Chloride: 99 mmol/L (ref 98–111)
Creatinine, Ser: 0.73 mg/dL (ref 0.44–1.00)
GFR, Estimated: >60 mL/min (ref >60)
Glucose, Bld: 203 mg/dL — ABNORMAL HIGH (ref 70–99)
Potassium: 4 mmol/L (ref 3.5–5.1)
Sodium: 132 mmol/L — ABNORMAL LOW (ref 135–145)
Total Bilirubin: 0.1 mg/dL — ABNORMAL LOW (ref 0.3–1.2)
Total Protein: 6.1 g/dL — ABNORMAL LOW (ref 6.5–8.1)

## 2021-12-01 LAB — CBC WITH DIFFERENTIAL/PLATELET
Abs Immature Granulocytes: 0.04 10*3/uL (ref 0.00–0.07)
Basophils Absolute: 0 10*3/uL (ref 0.0–0.1)
Basophils Relative: 0 %
Eosinophils Absolute: 0.2 10*3/uL (ref 0.0–0.5)
Eosinophils Relative: 2 %
HCT: 30.5 % — ABNORMAL LOW (ref 36.0–46.0)
Hemoglobin: 8.4 g/dL — ABNORMAL LOW (ref 12.0–15.0)
Immature Granulocytes: 1 %
Lymphocytes Relative: 29 %
Lymphs Abs: 1.9 10*3/uL (ref 0.7–4.0)
MCH: 25 pg — ABNORMAL LOW (ref 26.0–34.0)
MCHC: 27.5 g/dL — ABNORMAL LOW (ref 30.0–36.0)
MCV: 90.8 fL (ref 80.0–100.0)
Monocytes Absolute: 0.7 10*3/uL (ref 0.1–1.0)
Monocytes Relative: 10 %
Neutro Abs: 3.9 10*3/uL (ref 1.7–7.7)
Neutrophils Relative %: 58 %
Platelets: 358 10*3/uL (ref 150–400)
RBC: 3.36 MIL/uL — ABNORMAL LOW (ref 3.87–5.11)
RDW: 18.6 % — ABNORMAL HIGH (ref 11.5–15.5)
WBC: 6.7 10*3/uL (ref 4.0–10.5)
nRBC: 0 % (ref 0.0–0.2)

## 2021-12-01 LAB — IRON AND TIBC
Iron: 18 ug/dL — ABNORMAL LOW (ref 28–170)
Saturation Ratios: 9 % — ABNORMAL LOW (ref 10.4–31.8)
TIBC: 208 ug/dL — ABNORMAL LOW (ref 250–450)
UIBC: 190 ug/dL

## 2021-12-01 LAB — FOLATE: Folate: 4.2 ng/mL — ABNORMAL LOW (ref >5.9)

## 2021-12-01 LAB — FERRITIN: Ferritin: 603 ng/mL — ABNORMAL HIGH (ref 11–307)

## 2021-12-01 LAB — VITAMIN B12: Vitamin B-12: 273 pg/mL (ref 180–914)

## 2021-12-01 LAB — LACTATE DEHYDROGENASE: LDH: 138 U/L (ref 98–192)

## 2021-12-01 MED ORDER — LEVOTHYROXINE SODIUM 112 MCG PO TABS: 112.0000 ug | ORAL_TABLET | Freq: Every morning | ORAL | 7 refills | Status: DC

## 2021-12-02 ENCOUNTER — Other Ambulatory Visit: Payer: Self-pay | Admitting: Specialist

## 2021-12-02 ENCOUNTER — Encounter: Payer: Self-pay | Admitting: Specialist

## 2021-12-02 ENCOUNTER — Other Ambulatory Visit: Payer: Self-pay

## 2021-12-02 DIAGNOSIS — M545 Low back pain, unspecified: Secondary | ICD-10-CM

## 2021-12-02 DIAGNOSIS — E039 Hypothyroidism, unspecified: Secondary | ICD-10-CM

## 2021-12-02 NOTE — Progress Notes (Signed)
Spoke with patient's daughter, Marguarite Arbour, she will bring mom in tomorrow for thyroid labs.  Future order placed. ?

## 2021-12-03 NOTE — Addendum Note (Signed)
Addended by: Hendricks Limes F on: 12/03/2021 01:05 PM ? ? Modules accepted: Orders ? ?

## 2021-12-04 LAB — METHYLMALONIC ACID, SERUM: Methylmalonic Acid, Quantitative: 107 nmol/L (ref 0–378)

## 2021-12-08 ENCOUNTER — Other Ambulatory Visit (HOSPITAL_COMMUNITY): Payer: Self-pay | Admitting: *Deleted

## 2021-12-08 ENCOUNTER — Encounter (HOSPITAL_COMMUNITY): Payer: Self-pay | Admitting: Hematology

## 2021-12-08 ENCOUNTER — Other Ambulatory Visit: Payer: Self-pay

## 2021-12-08 ENCOUNTER — Other Ambulatory Visit: Payer: 59

## 2021-12-08 ENCOUNTER — Encounter (HOSPITAL_COMMUNITY): Payer: Self-pay

## 2021-12-08 ENCOUNTER — Inpatient Hospital Stay (HOSPITAL_BASED_OUTPATIENT_CLINIC_OR_DEPARTMENT_OTHER): Payer: 59 | Admitting: Hematology

## 2021-12-08 VITALS — BP 122/73 | HR 81 | Temp 98.0°F | Resp 18 | Wt 108.9 lb

## 2021-12-08 DIAGNOSIS — E039 Hypothyroidism, unspecified: Secondary | ICD-10-CM

## 2021-12-08 DIAGNOSIS — E876 Hypokalemia: Secondary | ICD-10-CM | POA: Diagnosis not present

## 2021-12-08 DIAGNOSIS — D649 Anemia, unspecified: Secondary | ICD-10-CM

## 2021-12-08 DIAGNOSIS — E538 Deficiency of other specified B group vitamins: Secondary | ICD-10-CM

## 2021-12-08 DIAGNOSIS — D472 Monoclonal gammopathy: Secondary | ICD-10-CM

## 2021-12-08 MED ORDER — DRONABINOL 5 MG PO CAPS: 5.0000 mg | ORAL_CAPSULE | Freq: Two times a day (BID) | ORAL | 3 refills | Status: DC

## 2021-12-08 MED ORDER — FOLIC ACID 1 MG PO TABS
1.0000 mg | ORAL_TABLET | Freq: Every day | ORAL | 3 refills | Status: DC
Start: 2021-12-08 — End: 2022-11-28

## 2021-12-08 NOTE — Progress Notes (Signed)
? ?[facility] ?[address] ?[place], [place] [address] ? ? ?CLINIC:  ?Medical Oncology/Hematology ? ?PCP:  ?Erica Brooklyn, FNP ?[address] [place] [address]  ?[phone] ? ?REASON FOR VISIT:  ?Follow-up for monoclonal gammopathy and normocytic anemia ? ?PRIOR THERAPY: none ? ?CURRENT THERAPY: surveillance ? ?INTERVAL HISTORY:  ?[name], a 66 y.o. female, returns for routine follow-up for her monoclonal gammopathy and normocytic anemia. Erica Beasley was last seen on 07/28/2021. ? ?Today she reports feeling fair. She has lost 12 lbs since 11/25/21. She reports lower back pain and depression; her appetite is poor. She no longer take Creon tablets. She spends most of the day laying down. She denies hematochezia. She reports she previously tried Marinol, and it did not improve her appetite at that time.  ? ?REVIEW OF SYSTEMS:  ?Review of Systems  ?Constitutional:  Positive for appetite change, fatigue and unexpected weight change.  ?Respiratory:  Positive for shortness of breath.   ?Gastrointestinal:  Positive for diarrhea. Negative for blood in stool.  ?Musculoskeletal:  Positive for back pain (6/10).  ?Psychiatric/Behavioral:  Positive for depression.   ?All other systems reviewed and are negative. ? ?PAST MEDICAL/SURGICAL HISTORY:  ?Past Medical History:  ?Diagnosis Date  ? Aortic atherosclerosis ([place]) 11/13/2020  ? CKD (chronic kidney disease) stage 2, GFR 60-89 ml/min   ? COPD (chronic obstructive pulmonary disease) ([place])   ? COVID-19 06/2019  ? Depression   ? Essential hypertension   ? GERD (gastroesophageal reflux disease)   ? History of diabetes mellitus   ? Hyperlipidemia   ? Hypothyroidism   ? Neuropathy   ? Rheumatoid arthritis ([place])   ? Vitamin B12 deficiency   ? Vitamin D deficiency   ? ?Past Surgical History:  ?Procedure Laterality Date  ? BIOPSY  09/16/2019  ? Procedure: BIOPSY;  Surgeon: Daneil Dolin, MD;  [facility];  Service: Endoscopy;;  ? BIOPSY  04/28/2020   ? Procedure: BIOPSY;  Surgeon: Daneil Dolin, MD;  [facility];  Service: Endoscopy;;  ? BIOPSY  06/08/2020  ? Procedure: BIOPSY;  Surgeon: Daneil Dolin, MD;  [facility];  Service: Endoscopy;;  ? CATARACT EXTRACTION Bilateral   ? COLONOSCOPY  09/2010  ? Dr. Posey Pronto: mild diverticulsis in sigmoid colon.   ? COLONOSCOPY WITH PROPOFOL N/A 09/16/2019  ? Rourk: Diverticulosis, random colon biopsies negative for microscopic colitis.  ? COLONOSCOPY WITH PROPOFOL N/A 06/08/2020  ? Procedure: COLONOSCOPY WITH PROPOFOL;  Surgeon: Daneil Dolin, MD;  [facility];  Service: Endoscopy;  Laterality: N/A;  9:15am  ? ESOPHAGOGASTRODUODENOSCOPY (EGD) WITH PROPOFOL N/A 04/28/2020  ? Procedure: ESOPHAGOGASTRODUODENOSCOPY (EGD) WITH PROPOFOL;  Surgeon: Daneil Dolin, MD;  [facility];  Service: Endoscopy;  Laterality: N/A;  2:15  ? YAG LASER APPLICATION Left 45/80/9983  ? Procedure: YAG LASER APPLICATION;  Surgeon: Williams Che, MD;  [facility];  Service: Ophthalmology;  Laterality: Left;  ? ? ?SOCIAL HISTORY:  ?Social History  ? ?Socioeconomic History  ? Marital status: Widowed  ?  Spouse name: Not on file  ? Number of children: 1  ? Years of education: Not on file  ? Highest education level: Not on file  ?Occupational History  ? Occupation: DISABLED  ?Tobacco Use  ? Smoking status: Former  ?  Packs/day: 1.00  ?  Years: 15.00  ?  Pack years: 15.00  ?  Types: Cigarettes  ?  Quit  date: 09/11/2017  ?  Years since quitting: 4.2  ? Smokeless tobacco: Former  ?Vaping Use  ? Vaping Use: Never used  ?Substance and Sexual Activity  ? Alcohol use: Never  ? Drug use: Never  ? Sexual activity: Not Currently  ?Other Topics Concern  ? Not on file  ?Social History Narrative  ? Not on file  ? ?Social Determinants of Health  ? ?Financial Resource Strain: Not on file  ?Food Insecurity: Not on file  ?Transportation Needs: Not on file  ?Physical Activity: Not on file  ?Stress: Not on file   ?Social Connections: Not on file  ?Intimate Partner Violence: Not on file  ? ? ?FAMILY HISTORY:  ?Family History  ?Problem Relation Age of Onset  ? Stroke Mother   ? Heart disease Mother   ? Diabetes Mother   ? Breast cancer Mother   ? Other Father   ?     stomach taken out for some reason  ? Migraines Daughter   ? Colon cancer Neg Hx   ? ? ?CURRENT MEDICATIONS:  ?Current Outpatient Medications  ?Medication Sig Dispense Refill  ? albuterol (VENTOLIN HFA) 108 (90 Base) MCG/ACT inhaler Inhale 2 puffs into the lungs every 6 (six) hours as needed for wheezing or shortness of breath.    ? aspirin 81 MG EC tablet Take 1 tablet (81 mg total) by mouth daily. Swallow whole. 30 tablet 12  ? Cholecalciferol (VITAMIN D3) 125 MCG (5000 UT) TABS Take 1 tablet (5,000 Units total) by mouth daily. 30 tablet 3  ? citalopram (CELEXA) 40 MG tablet Take 1 tablet (40 mg total) by mouth daily. 30 tablet 5  ? folic acid (FOLVITE) 1 MG tablet Take 1 tablet (1 mg total) by mouth daily. 90 tablet 3  ? gabapentin (NEURONTIN) 300 MG capsule TAKE (1) CAPSULE THREE TIMES DAILY. 90 capsule 4  ? golimumab (SIMPONI ARIA) 50 MG/4ML SOLN injection Inject into the vein.    ? HYDROcodone-acetaminophen (NORCO) 7.5-325 MG tablet Take 1 tablet by mouth every 6 (six) hours as needed for moderate pain. 30 tablet 0  ? hydroxychloroquine (PLAQUENIL) 200 MG tablet Take 200 mg by mouth daily.     ? leflunomide (ARAVA) 20 MG tablet Take 20 mg by mouth daily.    ? levothyroxine (SYNTHROID) 112 MCG tablet Take 1 tablet (112 mcg total) by mouth every morning. (NEEDS TO BE SEEN BEFORE NEXT REFILL) 30 tablet 7  ? loperamide (IMODIUM A-D) 2 MG tablet Take 1 tablet (2 mg total) by mouth in the morning. Take second dose if needed (Patient taking differently: Take 2 mg by mouth as needed. Take second dose if needed) 30 tablet 0  ? methocarbamol (ROBAXIN) 500 MG tablet Take 1 tablet (500 mg total) by mouth 2 (two) times daily. 20 tablet 0  ? oxyCODONE-acetaminophen  (PERCOCET/ROXICET) 5-325 MG tablet Take 1 tablet by mouth every 6 (six) hours as needed for severe pain. 10 tablet 0  ? Pancrelipase, Lip-Prot-Amyl, (CREON) 24000-76000 units CPEP Take 1-2 capsules (24,000-48,000 Units total) by mouth See admin instructions. Take 48,000 units (2 capsules) with meals and 24000 units (1 capsule) with snacks 240 capsule 3  ? pantoprazole (PROTONIX) 40 MG tablet TAKE ONE TABLET BY MOUTH DAILY BEFORE BREAKFAST 90 tablet 1  ? potassium chloride SA (KLOR-CON) 20 MEQ tablet Take 1 tablet (20 mEq total) by mouth daily. (NEEDS TO BE SEEN BEFORE NEXT REFILL) 30 tablet 6  ? pravastatin (PRAVACHOL) 40 MG tablet TAKE ONE TABLET AT BEDTIME  90 tablet 0  ? predniSONE (DELTASONE) 5 MG tablet Take 5 mg by mouth daily. Pt states she takes with severe arthritic pain    ? sulfaDIAZINE 500 MG tablet Take 500 mg by mouth 2 (two) times daily.    ? sulfaSALAzine (AZULFIDINE) 500 MG tablet 1 tablet    ? traZODone (DESYREL) 100 MG tablet TAKE 1 TABLET AT BEDTIME AS NEEDED FOR SLEEP 30 tablet 0  ? ?No current facility-administered medications for this visit.  ? ? ?ALLERGIES:  ?Allergies  ?Allergen Reactions  ? Adalimumab   ?  Other reaction(s): no response ?Pt is unaware of this  ? ? ?PHYSICAL EXAM:  ?Performance status (ECOG): 1 - Symptomatic but completely ambulatory ? ?Vitals:  ? 12/08/21 1123  ?BP: 122/73  ?Pulse: 81  ?Resp: 18  ?Temp: 98 ?F (36.7 ?C)  ?SpO2: 95%  ? ?Wt Readings from Last 3 Encounters:  ?12/08/21 108 lb 14.5 oz (49.4 kg)  ?11/25/21 120 lb (54.4 kg)  ?10/24/21 121 lb (54.9 kg)  ? ?Physical Exam ?Vitals reviewed.  ?Constitutional:   ?   Appearance: Normal appearance.  ?   Comments: In wheelchair  ?Cardiovascular:  ?   Rate and Rhythm: Normal rate and regular rhythm.  ?   Pulses: Normal pulses.  ?   Heart sounds: Normal heart sounds.  ?Pulmonary:  ?   Effort: Pulmonary effort is normal.  ?   Breath sounds: Normal breath sounds.  ?Neurological:  ?   General: No focal deficit present.  ?    Mental Status: She is alert and oriented to person, place, and time.  ?Psychiatric:     ?   Mood and Affect: Mood normal.     ?   Behavior: Behavior normal.  ? ? ?LABORATORY DATA:  ?I have reviewed the labs as lis

## 2021-12-08 NOTE — Patient Instructions (Addendum)
[facility] at [facility] ?Discharge Instructions ? ? ?You were seen and examined today by Dr. Delton Coombes. ? ?You need to eat so you can feel and be stronger so you they can do your back surgery in order to help relieve your back pain. We have sent a prescription for Marinol to your pharmacy to help boost your appetite. Take as prescribed.  ? ?He reviewed the results of your lab work.  You hemoglobin is low and your folic acid is quite low.  We have sent a prescription to your pharmacy for folic acid supplement.  You need to take 1 pill daily.  ? ?Return as scheduled in 6 weeks.  ? ? ?Thank you for choosing [facility] at [facility] to provide your oncology and hematology care.  To afford each patient quality time with our provider, please arrive at least 15 minutes before your scheduled appointment time.  ? ?If you have a lab appointment with the [facility] please come in thru the Main Entrance and check in at the main information desk. ? ?You need to re-schedule your appointment should you arrive 10 or more minutes late.  We strive to give you quality time with our providers, and arriving late affects you and other patients whose appointments are after yours.  Also, if you no show three or more times for appointments you may be dismissed from the clinic at the providers discretion.     ?Again, thank you for choosing [facility].  Our hope is that these requests will decrease the amount of time that you wait before being seen by our physicians.       ?_____________________________________________________________ ? ?Should you have questions after your visit to [facility], please contact our office at [phone] and follow the prompts.  Our office hours are 8:00 a.m. and 4:30 p.m. Monday - Friday.  Please note that voicemails left after 4:00 p.m. may not be returned until the following business day.  We are closed weekends and major  holidays.  You do have access to a nurse 24-7, just call the main number to the clinic [phone] and do not press any options, hold on the line and a nurse will answer the phone.   ? ?For prescription refill requests, have your pharmacy contact our office and allow 72 hours.   ? ?Due to Covid, you will need to wear a mask upon entering the hospital. If you do not have a mask, a mask will be given to you at the Main Entrance upon arrival. For doctor visits, patients may have 1 support person age 34 or older with them. For treatment visits, patients can not have anyone with them due to social distancing guidelines and our immunocompromised population.  ? ?   ?

## 2021-12-09 ENCOUNTER — Other Ambulatory Visit: Payer: Self-pay | Admitting: Family Medicine

## 2021-12-09 LAB — T4, FREE: Free T4: 1.27 ng/dL (ref 0.82–1.77)

## 2021-12-09 LAB — TSH: TSH: 9.63 u[IU]/mL — ABNORMAL HIGH (ref 0.450–4.500)

## 2021-12-09 MED ORDER — LEVOTHYROXINE SODIUM 125 MCG PO TABS: 125.0000 ug | ORAL_TABLET | Freq: Every morning | ORAL | 2 refills | Status: DC

## 2021-12-09 NOTE — Progress Notes (Signed)
Daughter calling about lab works. Please call back.  ?

## 2021-12-17 ENCOUNTER — Other Ambulatory Visit: Payer: Self-pay

## 2021-12-17 ENCOUNTER — Ambulatory Visit (HOSPITAL_COMMUNITY)
Admission: RE | Admit: 2021-12-17 | Discharge: 2021-12-17 | Disposition: A | Discharge status: Home / Self Care | Payer: 59 | Source: Ambulatory Visit | Attending: Specialist | Admitting: Specialist

## 2021-12-17 DIAGNOSIS — M545 Low back pain, unspecified: Secondary | ICD-10-CM | POA: Diagnosis present

## 2021-12-17 DIAGNOSIS — M4316 Spondylolisthesis, lumbar region: Secondary | ICD-10-CM | POA: Insufficient documentation

## 2021-12-17 DIAGNOSIS — M48061 Spinal stenosis, lumbar region without neurogenic claudication: Secondary | ICD-10-CM | POA: Insufficient documentation

## 2021-12-17 IMAGING — MR MR LUMBAR SPINE W/O CM
4 of 5 series · 18 of 48 positions shown · non-contrast
Comparison: Radiography [DATE].  MRI [DATE]

CLINICAL DATA: Low back pain, unspecified back pain laterality,
unspecified chronicity, unspecified whether sciatica present.

EXAM:
MRI LUMBAR SPINE WITHOUT CONTRAST
TECHNIQUE: Multiplanar, multisequence MR imaging of the lumbar spine was
performed. No intravenous contrast was administered.

[Series 4: T2 · sagittal · 4.0mm · 0.55mm/px · 6 of 18 slices shown (1 of 2)]
[im 1/18]
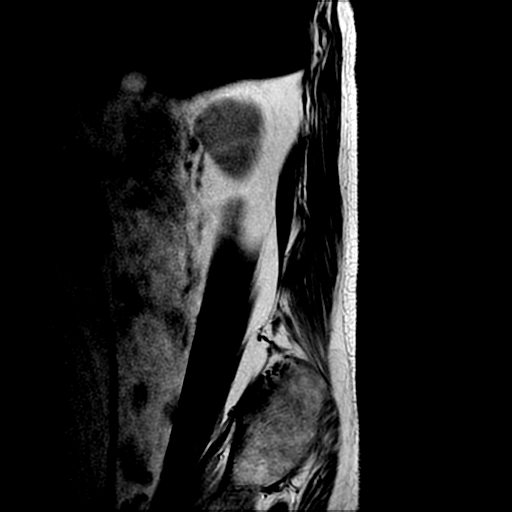
[im 4/18]
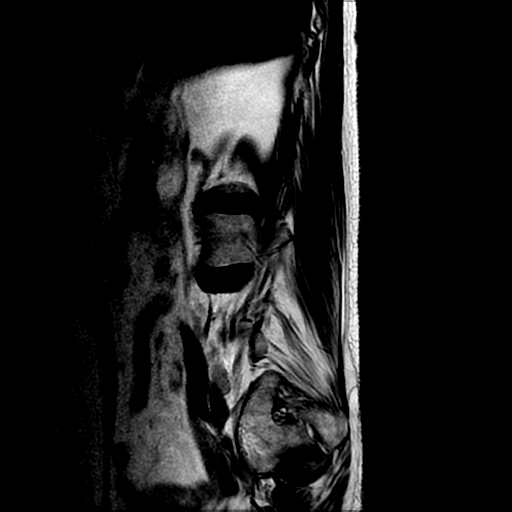
[im 7/18]
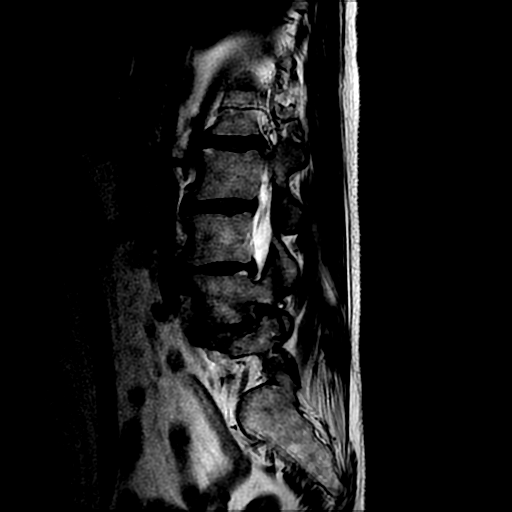
[im 11/18]
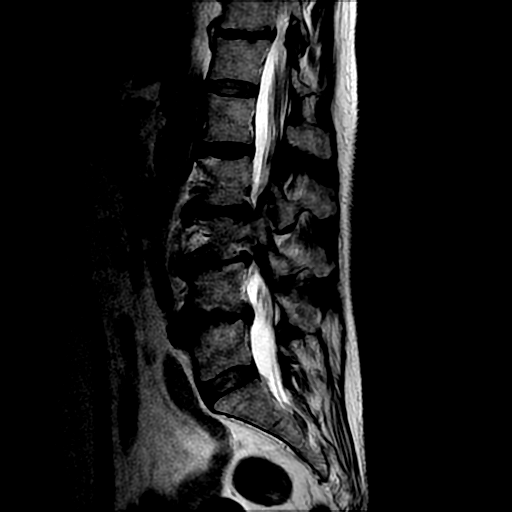
[im 14/18]
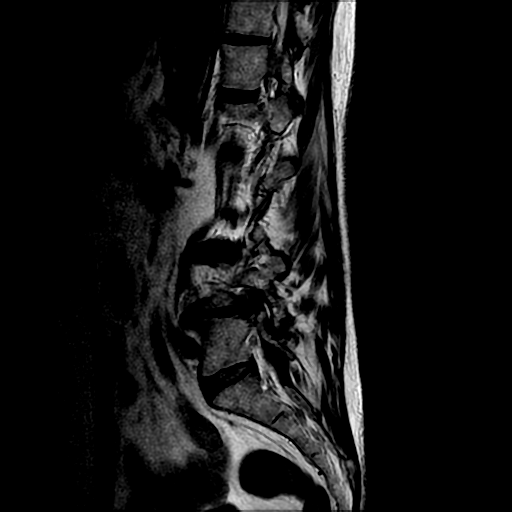
[im 18/18]
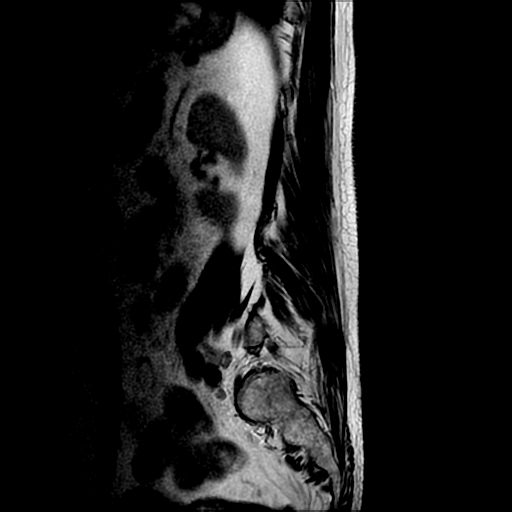

[Series 5: T1 · sagittal · 4.0mm · 0.55mm/px · 3 of 18 slices shown (1 of 2)]
[im 3/18]
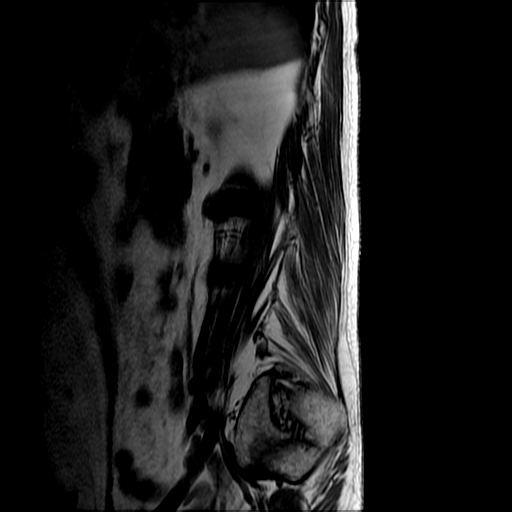
[im 9/18]
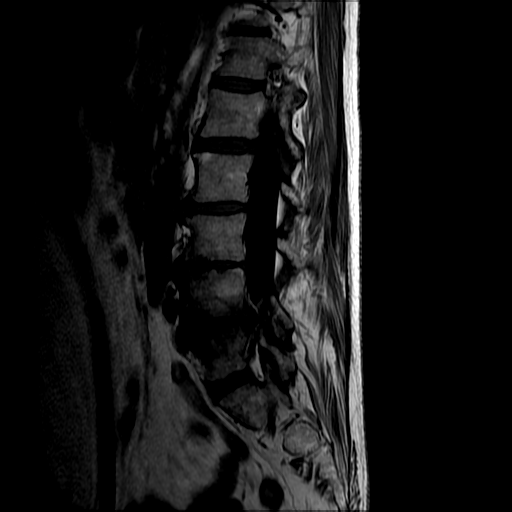
[im 15/18]
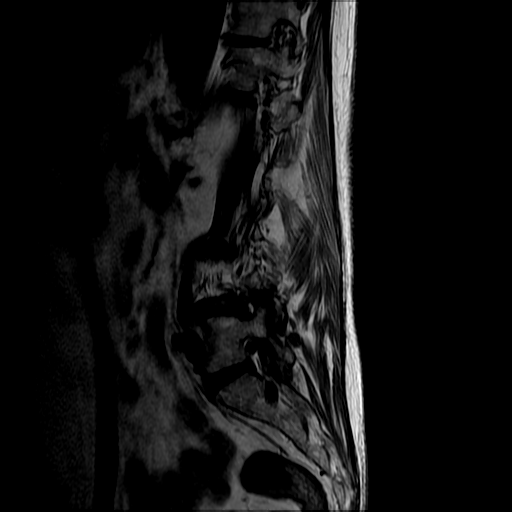

[Series 7: T2 · axial · 4.0mm · 0.39mm/px · z∈[-222,-20]mm · 6 of 37 slices shown (2 of 2)]
[im 1/37]
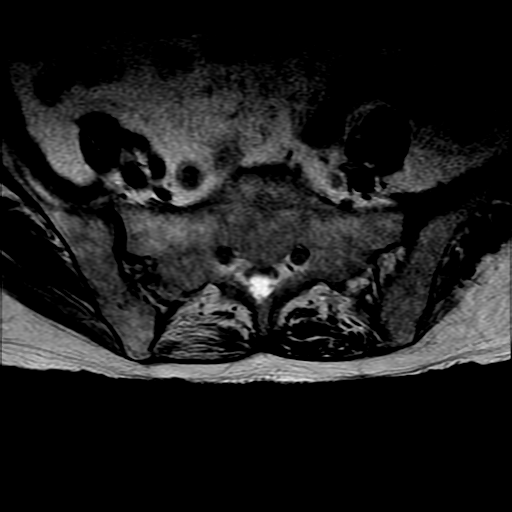
[im 6/37]
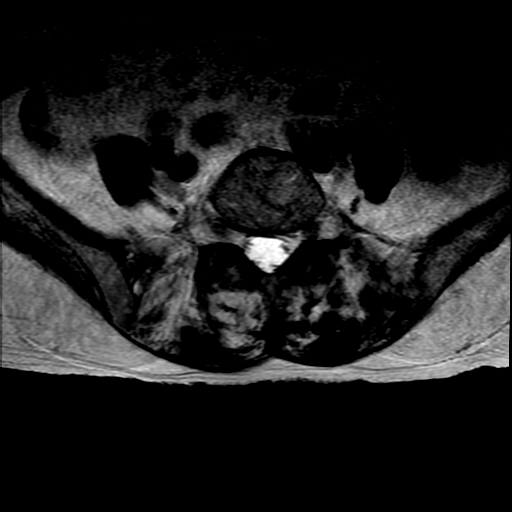
[im 12/37]
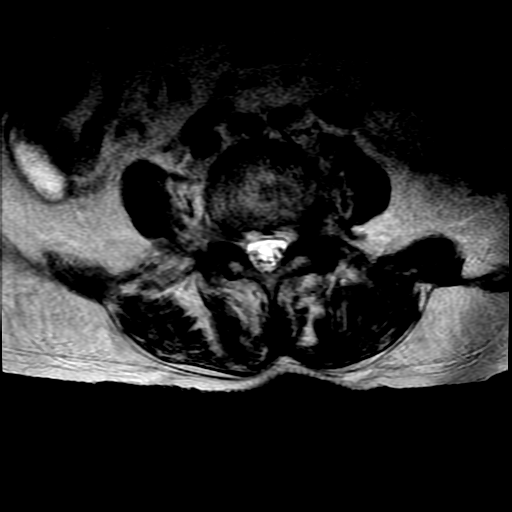
[im 17/37]
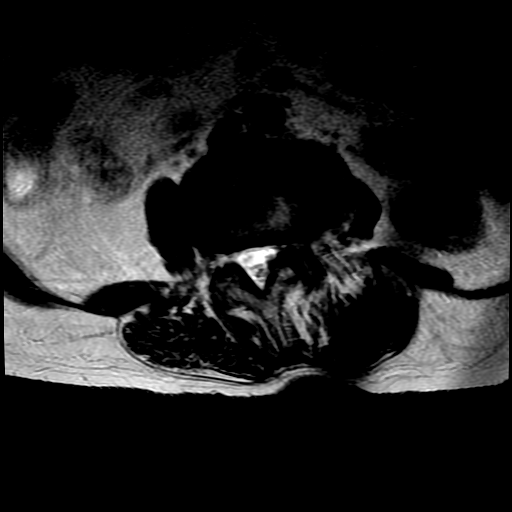
[im 20/37]
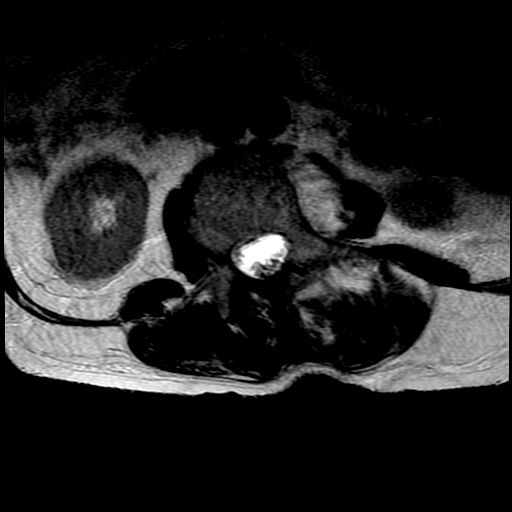
[im 31/37]
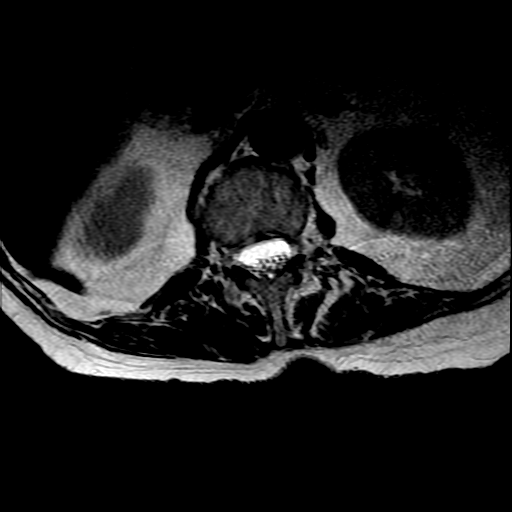

[Series 8: T1 · axial · 4.0mm · 0.39mm/px · z∈[-197,-20]mm · 3 of 37 slices shown (2 of 2)]
[im 6/37]
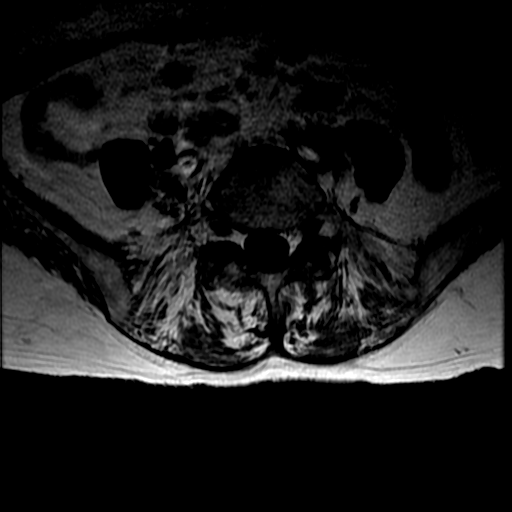
[im 20/37]
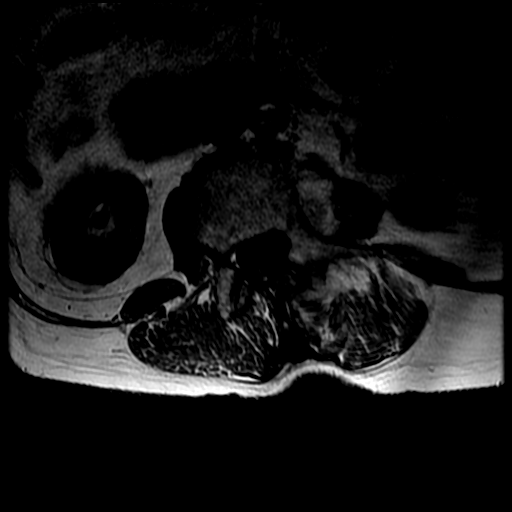
[im 31/37]
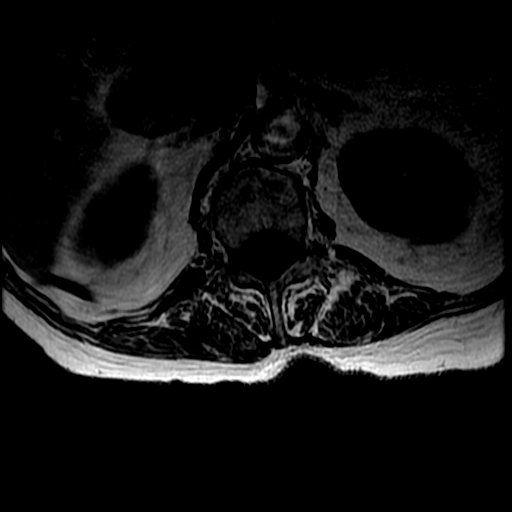

[18 of 48 positions shown; findings below may reference images not displayed]

FINDINGS: Segmentation:  5 lumbar type vertebral bodies.

Alignment: Scoliotic curvature convex to the right with the apex at
L3. No antero or retrolisthesis.

Vertebrae: Discogenic endplate marrow edema at L3-4 and L4-5,
similar to the study of last [REDACTED]. This could relate to
regional back pain.

Conus medullaris and cauda equina: Conus extends to the L1 level.
Conus and cauda equina appear normal.

Paraspinal and other soft tissues: Negative

Disc levels:

T11-12: Disc bulge.  No compressive stenosis.

T12-L1: Normal interspace.

L1-2: Mild bulging of the disc centrally and towards the right. No
compressive stenosis. No change.

L2-3: Bulging of the disc more towards the left. Mild facet and
ligamentous hypertrophy more on the left. Mild narrowing of the left
lateral recess and intervertebral foramen on the left but without
visible neural compression. No visible change.

L3-4: Disc degeneration with loss of disc height, particularly on
the left. Endplate osteophytes and bulging of the disc more towards
the left. Facet and ligamentous hypertrophy worse on the left.
Narrowing of the lateral recesses and foramina, left more than
right. Neural compression could occur on the left at this level.
Similar appearance to the last exam.

L4-5: Disc degeneration with loss of disc height. Circumferential
protrusion of the disc. Mild facet and ligamentous hypertrophy.
Stenosis of both subarticular lateral recesses and neural foramina
that could cause neural compression on both sides. This appears to
have worsened somewhat.

L5-S1: Normal appearance of the disc. Wide patency of the canal and
foramina.
IMPRESSION: Chronic discogenic edematous changes at L3-4 and L4-5 that could
relate to regional back pain. Scoliotic curvature convex to the
right with the apex at L3 as seen previously.

Apparent worsening of findings at the L4-5 level. Circumferential
protrusion of the disc with stenosis of both lateral recesses and
neural foramina that could cause neural compression on either or
both sides.

L3-4: No change. Disc bulge more prominent towards the left. Facet
hypertrophy more on the left. Narrowing of the left lateral recess
and intervertebral foramen on the left.

L2-3: No change. Left-sided predominant spondylosis with mild
narrowing of the left lateral recess and intervertebral foramen on
the left.

## 2021-12-25 ENCOUNTER — Encounter (HOSPITAL_COMMUNITY): Payer: Self-pay | Admitting: Hematology

## 2021-12-27 ENCOUNTER — Other Ambulatory Visit: Payer: Self-pay | Admitting: Family Medicine

## 2021-12-27 DIAGNOSIS — F339 Major depressive disorder, recurrent, unspecified: Secondary | ICD-10-CM

## 2021-12-30 ENCOUNTER — Ambulatory Visit (INDEPENDENT_AMBULATORY_CARE_PROVIDER_SITE_OTHER): Payer: Medicare Other | Admitting: Specialist

## 2021-12-30 ENCOUNTER — Encounter: Payer: Self-pay | Admitting: Specialist

## 2021-12-30 ENCOUNTER — Encounter (HOSPITAL_COMMUNITY): Payer: Self-pay | Admitting: Hematology

## 2021-12-30 VITALS — BP 120/76 | HR 91 | Ht 64.0 in | Wt 109.0 lb

## 2021-12-30 DIAGNOSIS — M4156 Other secondary scoliosis, lumbar region: Secondary | ICD-10-CM

## 2021-12-30 DIAGNOSIS — M48062 Spinal stenosis, lumbar region with neurogenic claudication: Secondary | ICD-10-CM

## 2021-12-30 DIAGNOSIS — M818 Other osteoporosis without current pathological fracture: Secondary | ICD-10-CM

## 2021-12-30 DIAGNOSIS — M4726 Other spondylosis with radiculopathy, lumbar region: Secondary | ICD-10-CM

## 2021-12-30 DIAGNOSIS — M48061 Spinal stenosis, lumbar region without neurogenic claudication: Secondary | ICD-10-CM

## 2021-12-30 DIAGNOSIS — M5416 Radiculopathy, lumbar region: Secondary | ICD-10-CM

## 2021-12-30 NOTE — Patient Instructions (Signed)
Plan: Avoid bending, stooping and avoid lifting weights greater than 10 lbs. ?Avoid prolong standing and walking. ?Order for a new walker with wheels. ?Surgery scheduling secretary Kandice Hams, will call you in the next week to schedule for surgery.  ?Surgery recommended is a three level lumbar fusion L2-3, L3-4 and L4-5 this would be done with rods, screws and cages and cement with local bone graft and allograft (donor bone graft). ?Take hydrocodone for for pain. ?Risk of surgery includes risk of infection 1 in 200 patients, bleeding 1/2% chance you would need a transfusion.   Risk to the nerves is one in 10,000. ?You will need to use a brace for 3 months and wean from the brace on the 4th month. ?Expect improved walking and standing tolerance. Expect relief of leg pain but numbness ?may persist depending on the length and degree of pressure that has been present. ?Bone density test to understand the risk of loosening of hardware due to weaken bone strength.   ?

## 2021-12-30 NOTE — Progress Notes (Signed)
? ?Office Visit Note ?  ?Patient: Erica Beasley           ?Date of Birth: December 09, 1955           ?MRN: [id number] ?Visit Date: 12/30/2021 ?             ?Requested by: Loman Brooklyn, FNP ?[address] [place],  [place] [address] ?PCP: Loman Brooklyn, FNP ? ? ?Assessment & Plan: ?Visit Diagnoses:  ?1. Other secondary scoliosis, lumbar region   ?2. Other spondylosis with radiculopathy, lumbar region   ?3. Lumbar radiculopathy   ?4. Foraminal stenosis of lumbar region   ?5. Spinal stenosis, lumbar region with neurogenic claudication   ? ? ?Plan: Avoid bending, stooping and avoid lifting weights greater than 10 lbs. ?Avoid prolong standing and walking. ?Order for a new walker with wheels. ?Surgery scheduling secretary Kandice Hams, will call you in the next week to schedule for surgery.  ?Surgery recommended is a three level lumbar fusion L2-3, L3-4 and L4-5 this would be done with rods, screws and cages and cement with local bone graft and allograft (donor bone graft). ?Take hydrocodone for for pain. ?Risk of surgery includes risk of infection 1 in 200 patients, bleeding 1/2% chance you would need a transfusion.   Risk to the nerves is one in 10,000. ?You will need to use a brace for 3 months and wean from the brace on the 4th month. ?Expect improved walking and standing tolerance. Expect relief of leg pain but numbness ?may persist depending on the length and degree of pressure that has been present. ?Bone density test to understand the risk of loosening of hardware due to weaken bone strength.   ? ?Follow-Up Instructions: No follow-ups on file.  ? ?Orders:  ?No orders of the defined types were placed in this encounter. ? ?No orders of the defined types were placed in this encounter. ? ? ? ? Procedures: ?No procedures performed ? ? ?Clinical Data: ?No additional findings. ? ? ?Subjective: ?Chief Complaint  ?Patient presents with  ? Lower Back - Follow-up  ? ? ?HPI ? ?Review of Systems ? ? ?Objective: ?Vital Signs:  BP 120/76 (BP Location: Left Arm, Patient Position: Sitting)   Pulse 91   Ht '5\' 4"'$  (1.626 m)   Wt 109 lb (49.4 kg)   BMI 18.71 kg/m?  ? ?Physical Exam ? ?Ortho Exam ? ?Specialty Comments:  ?No specialty comments available. ? ?Imaging: ?No results found. ? ? ?PMFS History: ?Patient Active Problem List  ? Diagnosis Date Noted  ? Hypothyroidism 11/13/2020  ? Prediabetes 11/13/2020  ? B12 deficiency 11/13/2020  ? COPD without exacerbation ([place]) 11/13/2020  ? Vitamin D deficiency 11/13/2020  ? Neuropathy 11/13/2020  ? Rheumatoid arthritis involving multiple sites ([place]) 11/13/2020  ? Mixed hyperlipidemia 11/13/2020  ? Aortic atherosclerosis ([place]) 11/13/2020  ? GERD (gastroesophageal reflux disease) 11/05/2020  ? Depression, recurrent ([place]) 09/22/2020  ? Essential hypertension 09/22/2020  ? Dehydration 08/13/2020  ? Hypokalemia 08/13/2020  ? Abnormal CT of the abdomen 06/02/2020  ? N&V (nausea and vomiting) 04/08/2020  ? Monoclonal gammopathy 04/03/2020  ? 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 03/26/2020  ? Hx of colonic polyps 05/03/2019  ? H/O Clostridium difficile infection 05/03/2019  ? Normocytic anemia 05/03/2019  ? Chronic diarrhea 01/29/2019  ? ?Past Medical History:  ?Diagnosis Date  ? Aortic atherosclerosis ([place]) 11/13/2020  ? CKD (chronic kidney disease) stage 2, GFR 60-89 ml/min   ? COPD (chronic obstructive pulmonary disease) ([place])   ?  COVID-19 06/2019  ? Depression   ? Essential hypertension   ? GERD (gastroesophageal reflux disease)   ? History of diabetes mellitus   ? Hyperlipidemia   ? Hypothyroidism   ? Neuropathy   ? Rheumatoid arthritis ([place])   ? Vitamin B12 deficiency   ? Vitamin D deficiency   ?  ?Family History  ?Problem Relation Age of Onset  ? Stroke Mother   ? Heart disease Mother   ? Diabetes Mother   ? Breast cancer Mother   ? Other Father   ?     stomach taken out for some reason  ? Migraines Daughter   ? Colon cancer Neg Hx   ?  ?Past Surgical History:  ?Procedure Laterality  Date  ? BIOPSY  09/16/2019  ? Procedure: BIOPSY;  Surgeon: Daneil Dolin, MD;  [facility];  Service: Endoscopy;;  ? BIOPSY  04/28/2020  ? Procedure: BIOPSY;  Surgeon: Daneil Dolin, MD;  [facility];  Service: Endoscopy;;  ? BIOPSY  06/08/2020  ? Procedure: BIOPSY;  Surgeon: Daneil Dolin, MD;  [facility];  Service: Endoscopy;;  ? CATARACT EXTRACTION Bilateral   ? COLONOSCOPY  09/2010  ? Dr. Posey Pronto: mild diverticulsis in sigmoid colon.   ? COLONOSCOPY WITH PROPOFOL N/A 09/16/2019  ? Rourk: Diverticulosis, random colon biopsies negative for microscopic colitis.  ? COLONOSCOPY WITH PROPOFOL N/A 06/08/2020  ? Procedure: COLONOSCOPY WITH PROPOFOL;  Surgeon: Daneil Dolin, MD;  [facility];  Service: Endoscopy;  Laterality: N/A;  9:15am  ? ESOPHAGOGASTRODUODENOSCOPY (EGD) WITH PROPOFOL N/A 04/28/2020  ? Procedure: ESOPHAGOGASTRODUODENOSCOPY (EGD) WITH PROPOFOL;  Surgeon: Daneil Dolin, MD;  [facility];  Service: Endoscopy;  Laterality: N/A;  2:15  ? YAG LASER APPLICATION Left 75/01/1832  ? Procedure: YAG LASER APPLICATION;  Surgeon: Williams Che, MD;  Location: [facility];  Service: Ophthalmology;  Laterality: Left;  ? ?Social History  ? ?Occupational History  ? Occupation: DISABLED  ?Tobacco Use  ? Smoking status: Former  ?  Packs/day: 1.00  ?  Years: 15.00  ?  Pack years: 15.00  ?  Types: Cigarettes  ?  Quit date: 09/11/2017  ?  Years since quitting: 4.3  ? Smokeless tobacco: Former  ?Vaping Use  ? Vaping Use: Never used  ?Substance and Sexual Activity  ? Alcohol use: Never  ? Drug use: Never  ? Sexual activity: Not Currently  ? ? ? ? ? ? ?

## 2022-01-03 ENCOUNTER — Encounter (HOSPITAL_COMMUNITY): Payer: Self-pay | Admitting: Hematology

## 2022-01-04 ENCOUNTER — Encounter: Payer: Self-pay | Admitting: Specialist

## 2022-01-05 ENCOUNTER — Ambulatory Visit
Admission: RE | Admit: 2022-01-05 | Discharge: 2022-01-05 | Disposition: A | Discharge status: Home / Self Care | Payer: Medicare Other | Source: Ambulatory Visit | Attending: Specialist | Admitting: Specialist

## 2022-01-05 DIAGNOSIS — M4156 Other secondary scoliosis, lumbar region: Secondary | ICD-10-CM

## 2022-01-05 DIAGNOSIS — M4726 Other spondylosis with radiculopathy, lumbar region: Secondary | ICD-10-CM

## 2022-01-05 DIAGNOSIS — M810 Age-related osteoporosis without current pathological fracture: Secondary | ICD-10-CM | POA: Diagnosis not present

## 2022-01-05 DIAGNOSIS — M818 Other osteoporosis without current pathological fracture: Secondary | ICD-10-CM

## 2022-01-05 DIAGNOSIS — Z780 Asymptomatic menopausal state: Secondary | ICD-10-CM | POA: Diagnosis not present

## 2022-01-05 DIAGNOSIS — M85832 Other specified disorders of bone density and structure, left forearm: Secondary | ICD-10-CM | POA: Diagnosis not present

## 2022-01-05 NOTE — Telephone Encounter (Signed)
SW pt daughter and gave her number to contact breast center to schedule appt ?

## 2022-01-06 DIAGNOSIS — M25561 Pain in right knee: Secondary | ICD-10-CM | POA: Diagnosis not present

## 2022-01-06 DIAGNOSIS — M1991 Primary osteoarthritis, unspecified site: Secondary | ICD-10-CM | POA: Diagnosis not present

## 2022-01-06 DIAGNOSIS — M0579 Rheumatoid arthritis with rheumatoid factor of multiple sites without organ or systems involvement: Secondary | ICD-10-CM | POA: Diagnosis not present

## 2022-01-06 DIAGNOSIS — L409 Psoriasis, unspecified: Secondary | ICD-10-CM | POA: Diagnosis not present

## 2022-01-06 DIAGNOSIS — M25562 Pain in left knee: Secondary | ICD-10-CM | POA: Diagnosis not present

## 2022-01-06 DIAGNOSIS — L4059 Other psoriatic arthropathy: Secondary | ICD-10-CM | POA: Diagnosis not present

## 2022-01-06 DIAGNOSIS — Z681 Body mass index (BMI) 19 or less, adult: Secondary | ICD-10-CM | POA: Diagnosis not present

## 2022-01-06 DIAGNOSIS — M2550 Pain in unspecified joint: Secondary | ICD-10-CM | POA: Diagnosis not present

## 2022-01-06 DIAGNOSIS — M5136 Other intervertebral disc degeneration, lumbar region: Secondary | ICD-10-CM | POA: Diagnosis not present

## 2022-01-06 DIAGNOSIS — Z79899 Other long term (current) drug therapy: Secondary | ICD-10-CM | POA: Diagnosis not present

## 2022-01-10 ENCOUNTER — Inpatient Hospital Stay (HOSPITAL_COMMUNITY): Payer: Medicare Other | Attending: Hematology

## 2022-01-10 DIAGNOSIS — M069 Rheumatoid arthritis, unspecified: Secondary | ICD-10-CM | POA: Diagnosis not present

## 2022-01-10 DIAGNOSIS — Z87891 Personal history of nicotine dependence: Secondary | ICD-10-CM | POA: Insufficient documentation

## 2022-01-10 DIAGNOSIS — D539 Nutritional anemia, unspecified: Secondary | ICD-10-CM | POA: Diagnosis not present

## 2022-01-10 DIAGNOSIS — E876 Hypokalemia: Secondary | ICD-10-CM | POA: Insufficient documentation

## 2022-01-10 DIAGNOSIS — Z803 Family history of malignant neoplasm of breast: Secondary | ICD-10-CM | POA: Diagnosis not present

## 2022-01-10 DIAGNOSIS — M810 Age-related osteoporosis without current pathological fracture: Secondary | ICD-10-CM | POA: Insufficient documentation

## 2022-01-10 DIAGNOSIS — F32A Depression, unspecified: Secondary | ICD-10-CM | POA: Insufficient documentation

## 2022-01-10 DIAGNOSIS — R197 Diarrhea, unspecified: Secondary | ICD-10-CM | POA: Diagnosis not present

## 2022-01-10 DIAGNOSIS — R803 Bence Jones proteinuria: Secondary | ICD-10-CM | POA: Diagnosis not present

## 2022-01-10 DIAGNOSIS — E538 Deficiency of other specified B group vitamins: Secondary | ICD-10-CM

## 2022-01-10 DIAGNOSIS — D649 Anemia, unspecified: Secondary | ICD-10-CM

## 2022-01-10 DIAGNOSIS — R634 Abnormal weight loss: Secondary | ICD-10-CM | POA: Diagnosis not present

## 2022-01-10 LAB — COMPREHENSIVE METABOLIC PANEL
ALT: 6 U/L (ref 0–44)
AST: 11 U/L — ABNORMAL LOW (ref 15–41)
Albumin: 2.8 g/dL — ABNORMAL LOW (ref 3.5–5.0)
Alkaline Phosphatase: 114 U/L (ref 38–126)
Anion gap: 9 (ref 5–15)
BUN: 6 mg/dL — ABNORMAL LOW (ref 8–23)
CO2: 26 mmol/L (ref 22–32)
Calcium: 8.7 mg/dL — ABNORMAL LOW (ref 8.9–10.3)
Chloride: 102 mmol/L (ref 98–111)
Creatinine, Ser: 0.67 mg/dL (ref 0.44–1.00)
GFR, Estimated: >60 mL/min (ref >60)
Glucose, Bld: 199 mg/dL — ABNORMAL HIGH (ref 70–99)
Potassium: 3.6 mmol/L (ref 3.5–5.1)
Sodium: 137 mmol/L (ref 135–145)
Total Bilirubin: 0.3 mg/dL (ref 0.3–1.2)
Total Protein: 5.8 g/dL — ABNORMAL LOW (ref 6.5–8.1)

## 2022-01-10 LAB — CBC WITH DIFFERENTIAL/PLATELET
Abs Immature Granulocytes: 0.02 10*3/uL (ref 0.00–0.07)
Basophils Absolute: 0 10*3/uL (ref 0.0–0.1)
Basophils Relative: 0 %
Eosinophils Absolute: 0.3 10*3/uL (ref 0.0–0.5)
Eosinophils Relative: 5 %
HCT: 29.9 % — ABNORMAL LOW (ref 36.0–46.0)
Hemoglobin: 8.2 g/dL — ABNORMAL LOW (ref 12.0–15.0)
Immature Granulocytes: 0 %
Lymphocytes Relative: 25 %
Lymphs Abs: 1.5 10*3/uL (ref 0.7–4.0)
MCH: 24.6 pg — ABNORMAL LOW (ref 26.0–34.0)
MCHC: 27.4 g/dL — ABNORMAL LOW (ref 30.0–36.0)
MCV: 89.5 fL (ref 80.0–100.0)
Monocytes Absolute: 0.6 10*3/uL (ref 0.1–1.0)
Monocytes Relative: 10 %
Neutro Abs: 3.4 10*3/uL (ref 1.7–7.7)
Neutrophils Relative %: 60 %
Platelets: 340 10*3/uL (ref 150–400)
RBC: 3.34 MIL/uL — ABNORMAL LOW (ref 3.87–5.11)
RDW: 18.5 % — ABNORMAL HIGH (ref 11.5–15.5)
WBC: 5.8 10*3/uL (ref 4.0–10.5)
nRBC: 0 % (ref 0.0–0.2)

## 2022-01-10 LAB — IRON AND TIBC
Iron: 20 ug/dL — ABNORMAL LOW (ref 28–170)
Saturation Ratios: 11 % (ref 10.4–31.8)
TIBC: 184 ug/dL — ABNORMAL LOW (ref 250–450)
UIBC: 164 ug/dL

## 2022-01-10 LAB — FOLATE: Folate: 20.1 ng/mL (ref >5.9)

## 2022-01-10 LAB — MAGNESIUM: Magnesium: 1.8 mg/dL (ref 1.7–2.4)

## 2022-01-10 LAB — FERRITIN: Ferritin: 520 ng/mL — ABNORMAL HIGH (ref 11–307)

## 2022-01-12 ENCOUNTER — Encounter: Payer: Self-pay | Admitting: Specialist

## 2022-01-13 LAB — METHYLMALONIC ACID, SERUM: Methylmalonic Acid, Quantitative: 125 nmol/L (ref 0–378)

## 2022-01-17 ENCOUNTER — Inpatient Hospital Stay (HOSPITAL_BASED_OUTPATIENT_CLINIC_OR_DEPARTMENT_OTHER): Payer: Medicare Other | Admitting: Hematology

## 2022-01-17 ENCOUNTER — Other Ambulatory Visit (HOSPITAL_COMMUNITY): Payer: Self-pay

## 2022-01-17 VITALS — BP 117/77 | HR 86 | Temp 97.6°F | Resp 18 | Ht 64.0 in | Wt 104.0 lb

## 2022-01-17 DIAGNOSIS — R803 Bence Jones proteinuria: Secondary | ICD-10-CM | POA: Diagnosis not present

## 2022-01-17 DIAGNOSIS — Z87891 Personal history of nicotine dependence: Secondary | ICD-10-CM | POA: Diagnosis not present

## 2022-01-17 DIAGNOSIS — Z803 Family history of malignant neoplasm of breast: Secondary | ICD-10-CM | POA: Diagnosis not present

## 2022-01-17 DIAGNOSIS — D472 Monoclonal gammopathy: Secondary | ICD-10-CM | POA: Diagnosis not present

## 2022-01-17 DIAGNOSIS — R634 Abnormal weight loss: Secondary | ICD-10-CM | POA: Diagnosis not present

## 2022-01-17 DIAGNOSIS — F32A Depression, unspecified: Secondary | ICD-10-CM | POA: Diagnosis not present

## 2022-01-17 DIAGNOSIS — M810 Age-related osteoporosis without current pathological fracture: Secondary | ICD-10-CM | POA: Insufficient documentation

## 2022-01-17 DIAGNOSIS — D649 Anemia, unspecified: Secondary | ICD-10-CM | POA: Diagnosis not present

## 2022-01-17 DIAGNOSIS — R197 Diarrhea, unspecified: Secondary | ICD-10-CM | POA: Diagnosis not present

## 2022-01-17 DIAGNOSIS — E876 Hypokalemia: Secondary | ICD-10-CM | POA: Diagnosis not present

## 2022-01-17 DIAGNOSIS — D539 Nutritional anemia, unspecified: Secondary | ICD-10-CM | POA: Diagnosis not present

## 2022-01-17 DIAGNOSIS — M069 Rheumatoid arthritis, unspecified: Secondary | ICD-10-CM | POA: Diagnosis not present

## 2022-01-17 MED ORDER — DRONABINOL 2.5 MG PO CAPS: 2.5000 mg | ORAL_CAPSULE | Freq: Two times a day (BID) | ORAL | 3 refills | Status: DC

## 2022-01-17 NOTE — Progress Notes (Signed)
? ?[facility] ?[address] ?[place], [place] [address] ? ? ?CLINIC:  ?Medical Oncology/Hematology ? ?PCP:  ?Loman Brooklyn, FNP ?[address] [place] [address]  ?[phone] ? ?REASON FOR VISIT:  ?Follow-up for Bence-Jones proteinuria, monoclonal gammopathy, and normocytic anemia ? ?PRIOR THERAPY: none ? ?CURRENT THERAPY: surveillance ? ?INTERVAL HISTORY:  ?[name], a 66 y.o. female, returns for routine follow-up for her Bence-Jones proteinuria, monoclonal gammopathy, and normocytic anemia. Erica Beasley was last seen on 12/08/2021. ? ?Today she reports feeling good. She denies infections in the past month. She denies current bleeding or black stools. She stopped Marinol after 1 week of trying it as she reports it made her "feel high". She reports she is eating well. She has lost 5 lbs since her last visit. She reports diarrhea for which she takes 2 Imodium daily prn. She denies dental issues, and reports she has 9 teeth.   ? ?REVIEW OF SYSTEMS:  ?Review of Systems  ?Constitutional:  Positive for unexpected weight change (-5 lbs). Negative for appetite change and fatigue.  ?HENT:   Negative for nosebleeds.   ?Respiratory:  Negative for hemoptysis.   ?Gastrointestinal:  Positive for diarrhea. Negative for blood in stool.  ?Genitourinary:  Negative for hematuria.   ?Musculoskeletal:  Positive for arthralgias (5/10 hips and legs) and back pain (lower).  ?Psychiatric/Behavioral:  The patient is nervous/anxious.   ?All other systems reviewed and are negative. ? ?PAST MEDICAL/SURGICAL HISTORY:  ?Past Medical History:  ?Diagnosis Date  ? Aortic atherosclerosis ([place]) 11/13/2020  ? CKD (chronic kidney disease) stage 2, GFR 60-89 ml/min   ? COPD (chronic obstructive pulmonary disease) ([place])   ? COVID-19 06/2019  ? Depression   ? Essential hypertension   ? GERD (gastroesophageal reflux disease)   ? History of diabetes mellitus   ? Hyperlipidemia   ? Hypothyroidism   ? Neuropathy   ? Rheumatoid  arthritis ([place])   ? Vitamin B12 deficiency   ? Vitamin D deficiency   ? ?Past Surgical History:  ?Procedure Laterality Date  ? BIOPSY  09/16/2019  ? Procedure: BIOPSY;  Surgeon: Daneil Dolin, MD;  [facility];  Service: Endoscopy;;  ? BIOPSY  04/28/2020  ? Procedure: BIOPSY;  Surgeon: Daneil Dolin, MD;  [facility];  Service: Endoscopy;;  ? BIOPSY  06/08/2020  ? Procedure: BIOPSY;  Surgeon: Daneil Dolin, MD;  [facility];  Service: Endoscopy;;  ? CATARACT EXTRACTION Bilateral   ? COLONOSCOPY  09/2010  ? Dr. Posey Pronto: mild diverticulsis in sigmoid colon.   ? COLONOSCOPY WITH PROPOFOL N/A 09/16/2019  ? Rourk: Diverticulosis, random colon biopsies negative for microscopic colitis.  ? COLONOSCOPY WITH PROPOFOL N/A 06/08/2020  ? Procedure: COLONOSCOPY WITH PROPOFOL;  Surgeon: Daneil Dolin, MD;  [facility];  Service: Endoscopy;  Laterality: N/A;  9:15am  ? ESOPHAGOGASTRODUODENOSCOPY (EGD) WITH PROPOFOL N/A 04/28/2020  ? Procedure: ESOPHAGOGASTRODUODENOSCOPY (EGD) WITH PROPOFOL;  Surgeon: Daneil Dolin, MD;  [facility];  Service: Endoscopy;  Laterality: N/A;  2:15  ? YAG LASER APPLICATION Left 67/08/4579  ? Procedure: YAG LASER APPLICATION;  Surgeon: Williams Che, MD;  [facility];  Service: Ophthalmology;  Laterality: Left;  ? ? ?SOCIAL HISTORY:  ?Social History  ? ?Socioeconomic History  ? Marital status: Widowed  ?  Spouse name: Not on file  ? Number of children: 1  ? Years of education: Not on file  ? Highest  education level: Not on file  ?Occupational History  ? Occupation: DISABLED  ?Tobacco Use  ? Smoking status: Former  ?  Packs/day: 1.00  ?  Years: 15.00  ?  Pack years: 15.00  ?  Types: Cigarettes  ?  Quit date: 09/11/2017  ?  Years since quitting: 4.3  ? Smokeless tobacco: Former  ?Vaping Use  ? Vaping Use: Never used  ?Substance and Sexual Activity  ? Alcohol use: Never  ? Drug use: Never  ? Sexual activity: Not Currently  ?Other Topics  Concern  ? Not on file  ?Social History Narrative  ? Not on file  ? ?Social Determinants of Health  ? ?Financial Resource Strain: Not on file  ?Food Insecurity: Not on file  ?Transportation Needs: Not on file  ?Physical Activity: Not on file  ?Stress: Not on file  ?Social Connections: Not on file  ?Intimate Partner Violence: Not on file  ? ? ?FAMILY HISTORY:  ?Family History  ?Problem Relation Age of Onset  ? Stroke Mother   ? Heart disease Mother   ? Diabetes Mother   ? Breast cancer Mother   ? Other Father   ?     stomach taken out for some reason  ? Migraines Daughter   ? Colon cancer Neg Hx   ? ? ?CURRENT MEDICATIONS:  ?Current Outpatient Medications  ?Medication Sig Dispense Refill  ? albuterol (VENTOLIN HFA) 108 (90 Base) MCG/ACT inhaler Inhale 2 puffs into the lungs every 6 (six) hours as needed for wheezing or shortness of breath.    ? aspirin 81 MG EC tablet Take 1 tablet (81 mg total) by mouth daily. Swallow whole. 30 tablet 12  ? Cholecalciferol (VITAMIN D3) 125 MCG (5000 UT) TABS Take 1 tablet (5,000 Units total) by mouth daily. 30 tablet 3  ? citalopram (CELEXA) 40 MG tablet Take 1 tablet (40 mg total) by mouth daily. 30 tablet 5  ? dronabinol (MARINOL) 5 MG capsule Take 1 capsule (5 mg total) by mouth 2 (two) times daily before a meal. 60 capsule 3  ? folic acid (FOLVITE) 1 MG tablet Take 1 tablet (1 mg total) by mouth daily. 90 tablet 3  ? gabapentin (NEURONTIN) 300 MG capsule TAKE (1) CAPSULE THREE TIMES DAILY. 90 capsule 4  ? golimumab (SIMPONI ARIA) 50 MG/4ML SOLN injection Inject into the vein.    ? HYDROcodone-acetaminophen (NORCO) 7.5-325 MG tablet Take 1 tablet by mouth every 6 (six) hours as needed for moderate pain. 30 tablet 0  ? hydroxychloroquine (PLAQUENIL) 200 MG tablet Take 200 mg by mouth daily.     ? leflunomide (ARAVA) 20 MG tablet Take 20 mg by mouth daily.    ? levothyroxine (SYNTHROID) 125 MCG tablet Take 1 tablet (125 mcg total) by mouth every morning. 30 tablet 2  ? loperamide  (IMODIUM A-D) 2 MG tablet Take 1 tablet (2 mg total) by mouth in the morning. Take second dose if needed (Patient taking differently: Take 2 mg by mouth as needed. Take second dose if needed) 30 tablet 0  ? methocarbamol (ROBAXIN) 500 MG tablet Take 1 tablet (500 mg total) by mouth 2 (two) times daily. 20 tablet 0  ? oxyCODONE-acetaminophen (PERCOCET/ROXICET) 5-325 MG tablet Take 1 tablet by mouth every 6 (six) hours as needed for severe pain. 10 tablet 0  ? Pancrelipase, Lip-Prot-Amyl, (CREON) 24000-76000 units CPEP Take 1-2 capsules (24,000-48,000 Units total) by mouth See admin instructions. Take 48,000 units (2 capsules) with meals and 24000 units (1 capsule)  with snacks 240 capsule 3  ? pantoprazole (PROTONIX) 40 MG tablet TAKE ONE TABLET BY MOUTH DAILY BEFORE BREAKFAST 90 tablet 1  ? potassium chloride SA (KLOR-CON) 20 MEQ tablet Take 1 tablet (20 mEq total) by mouth daily. (NEEDS TO BE SEEN BEFORE NEXT REFILL) 30 tablet 6  ? pravastatin (PRAVACHOL) 40 MG tablet TAKE ONE TABLET AT BEDTIME 90 tablet 0  ? predniSONE (DELTASONE) 5 MG tablet Take 5 mg by mouth daily. Pt states she takes with severe arthritic pain    ? sulfaDIAZINE 500 MG tablet Take 500 mg by mouth 2 (two) times daily.    ? sulfaSALAzine (AZULFIDINE) 500 MG tablet 1 tablet    ? traZODone (DESYREL) 100 MG tablet TAKE 1 TABLET AT BEDTIME AS NEEDED FOR SLEEP 90 tablet 0  ? ?No current facility-administered medications for this visit.  ? ? ?ALLERGIES:  ?No Active Allergies ? ? ?PHYSICAL EXAM:  ?Performance status (ECOG): 1 - Symptomatic but completely ambulatory ? ?Vitals:  ? 01/17/22 1105  ?BP: 117/77  ?Pulse: 86  ?Resp: 18  ?Temp: 97.6 ?F (36.4 ?C)  ?SpO2: 99%  ? ?Wt Readings from Last 3 Encounters:  ?01/17/22 104 lb (47.2 kg)  ?12/30/21 109 lb (49.4 kg)  ?12/08/21 108 lb 14.5 oz (49.4 kg)  ? ?Physical Exam ?Vitals reviewed.  ?Constitutional:   ?   Appearance: Normal appearance.  ?Cardiovascular:  ?   Rate and Rhythm: Normal rate and regular rhythm.   ?   Pulses: Normal pulses.  ?   Heart sounds: Normal heart sounds.  ?Pulmonary:  ?   Effort: Pulmonary effort is normal.  ?   Breath sounds: Normal breath sounds.  ?Neurological:  ?   General: No focal defi

## 2022-01-17 NOTE — Patient Instructions (Addendum)
[place] at [facility] ?Discharge Instructions ? ?You were seen and examined today by Dr. Delton Coombes. ? ?Dr. Delton Coombes discussed your most recent lab work. You are anemic still but your folic acid has improved. ? ?Please let us know when you have surgery scheduled so that we can give you blood prior if needed. ? ?Follow-up as scheduled. ? ? ? ?Thank you for choosing [place] at [facility] to provide your oncology and hematology care.  To afford each patient quality time with our provider, please arrive at least 15 minutes before your scheduled appointment time.  ? ?If you have a lab appointment with the [facility] please come in thru the Main Entrance and check in at the main information desk. ? ?You need to re-schedule your appointment should you arrive 10 or more minutes late.  We strive to give you quality time with our providers, and arriving late affects you and other patients whose appointments are after yours.  Also, if you no show three or more times for appointments you may be dismissed from the clinic at the providers discretion.     ?Again, thank you for choosing [facility].  Our hope is that these requests will decrease the amount of time that you wait before being seen by our physicians.       ?_____________________________________________________________ ? ?Should you have questions after your visit to [facility], please contact our office at [phone] and follow the prompts.  Our office hours are 8:00 a.m. and 4:30 p.m. Monday - Friday.  Please note that voicemails left after 4:00 p.m. may not be returned until the following business day.  We are closed weekends and major holidays.  You do have access to a nurse 24-7, just call the main number to the clinic [phone] and do not press any options, hold on the line and a nurse will answer the phone.   ? ?For prescription refill requests, have your pharmacy  contact our office and allow 72 hours.   ? ?Due to Covid, you will need to wear a mask upon entering the hospital. If you do not have a mask, a mask will be given to you at the Main Entrance upon arrival. For doctor visits, patients may have 1 support person age 58 or older with them. For treatment visits, patients can not have anyone with them due to social distancing guidelines and our immunocompromised population.  ? ? ? ?

## 2022-01-24 ENCOUNTER — Other Ambulatory Visit (HOSPITAL_COMMUNITY): Payer: Self-pay | Admitting: *Deleted

## 2022-01-24 DIAGNOSIS — D649 Anemia, unspecified: Secondary | ICD-10-CM

## 2022-01-24 LAB — OCCULT BLOOD X 1 CARD TO LAB, STOOL
Fecal Occult Bld: NEGATIVE
Fecal Occult Bld: NEGATIVE
Fecal Occult Bld: NEGATIVE

## 2022-01-26 ENCOUNTER — Other Ambulatory Visit: Payer: Self-pay | Admitting: Family Medicine

## 2022-01-26 DIAGNOSIS — G629 Polyneuropathy, unspecified: Secondary | ICD-10-CM

## 2022-01-27 ENCOUNTER — Encounter (HOSPITAL_COMMUNITY): Payer: Self-pay

## 2022-01-27 ENCOUNTER — Other Ambulatory Visit (HOSPITAL_COMMUNITY): Payer: Self-pay | Admitting: Hematology

## 2022-01-28 ENCOUNTER — Ambulatory Visit: Payer: 59 | Admitting: Specialist

## 2022-01-28 ENCOUNTER — Encounter: Payer: Self-pay | Admitting: Specialist

## 2022-01-28 ENCOUNTER — Ambulatory Visit (INDEPENDENT_AMBULATORY_CARE_PROVIDER_SITE_OTHER): Payer: Medicare Other | Admitting: Specialist

## 2022-01-28 VITALS — BP 114/77 | HR 88 | Ht 64.0 in | Wt 104.0 lb

## 2022-01-28 DIAGNOSIS — M48062 Spinal stenosis, lumbar region with neurogenic claudication: Secondary | ICD-10-CM

## 2022-01-28 DIAGNOSIS — M4156 Other secondary scoliosis, lumbar region: Secondary | ICD-10-CM | POA: Diagnosis not present

## 2022-01-28 DIAGNOSIS — R739 Hyperglycemia, unspecified: Secondary | ICD-10-CM

## 2022-01-28 DIAGNOSIS — M818 Other osteoporosis without current pathological fracture: Secondary | ICD-10-CM | POA: Diagnosis not present

## 2022-01-28 DIAGNOSIS — M48061 Spinal stenosis, lumbar region without neurogenic claudication: Secondary | ICD-10-CM

## 2022-01-28 DIAGNOSIS — M5416 Radiculopathy, lumbar region: Secondary | ICD-10-CM

## 2022-01-28 DIAGNOSIS — M4726 Other spondylosis with radiculopathy, lumbar region: Secondary | ICD-10-CM

## 2022-01-28 MED ORDER — METHOCARBAMOL 500 MG PO TABS: 500.0000 mg | ORAL_TABLET | Freq: Two times a day (BID) | ORAL | 0 refills | Status: DC

## 2022-01-28 MED ORDER — HYDROCODONE-ACETAMINOPHEN 7.5-325 MG PO TABS: 1.0000 | ORAL_TABLET | Freq: Four times a day (QID) | ORAL | 0 refills | Status: DC | PRN

## 2022-01-28 NOTE — Patient Instructions (Signed)
Plan: Avoid bending, stooping and avoid lifting weights greater than 10 lbs. ?Avoid prolong standing and walking. ?Order for a new walker with wheels. ?Surgery scheduling secretary Kandice Hams, will call you in the next week to schedule for surgery.  ?Surgery recommended is a three level lumbar fusion L2-3, L3-4 and L4-5 this would be done with rods, screws and cages and cement with local bone graft and allograft (donor bone graft). ?Take hydrocodone for for pain. ?Risk of surgery includes risk of infection 1 in 200 patients, bleeding 1/2% chance you would need a transfusion.   Risk to the nerves is one in 10,000. ?You will need to use a brace for 3 months and wean from the brace on the 4th month. ?Expect improved walking and standing tolerance. Expect relief of leg pain but numbness ?may persist depending on the length and degree of pressure that has been present. ?Bone density test shows the risk of loosening of hardware due to weaken bone strength is significant ?Your glucose level is 199 on last blood test, a HgbA1c needs to be done to assess your average blood sugar and diabetes brought under control if necessary.  ?  ?  ?

## 2022-01-28 NOTE — Addendum Note (Signed)
Addended by: Basil Dess on: 01/28/2022 11:58 AM ? ? Modules accepted: Orders ? ?

## 2022-01-28 NOTE — Progress Notes (Signed)
Follow up DEXA scan- done 3 weeks ago at [facility], also to discuss Lumbar spine surgery of L2-5.  ?

## 2022-01-28 NOTE — Progress Notes (Signed)
? ?Office Visit Note ?  ?Patient: Erica Beasley           ?Date of Birth: [birth year]           ?MRN: [id number] ?Visit Date: 01/28/2022 ?             ?Requested by: Loman Brooklyn, FNP ?[address] [place],  [place] [address] ?PCP: Loman Brooklyn, FNP ? ? ?Assessment & Plan: ?Visit Diagnoses:  ?1. Hyperglycemia   ?2. Other secondary scoliosis, lumbar region   ?3. Lumbar radiculopathy   ?4. Other spondylosis with radiculopathy, lumbar region   ?5. Foraminal stenosis of lumbar region   ?6. Spinal stenosis, lumbar region with neurogenic claudication   ?7. Other osteoporosis without current pathological fracture   ? ? ?Plan: Plan: Avoid bending, stooping and avoid lifting weights greater than 10 lbs. ?Avoid prolong standing and walking. ?Order for a new walker with wheels. ?Surgery scheduling secretary Kandice Hams, will call you in the next week to schedule for surgery.  ?Surgery recommended is a three level lumbar fusion L2-3, L3-4 and L4-5 this would be done with rods, screws and cages and cement with local bone graft and allograft (donor bone graft). ?Take hydrocodone for for pain. ?Risk of surgery includes risk of infection 1 in 200 patients, bleeding 1/2% chance you would need a transfusion.   Risk to the nerves is one in 10,000. ?You will need to use a brace for 3 months and wean from the brace on the 4th month. ?Expect improved walking and standing tolerance. Expect relief of leg pain but numbness ?may persist depending on the length and degree of pressure that has been present. ?Bone density test shows the risk of loosening of hardware due to weaken bone strength is significant ?Your glucose level is 199 on last blood test, a HgbA1c needs to be done to assess your average blood sugar and diabetes brought under control if necessary.  ?  ? ?Follow-Up Instructions: No follow-ups on file.  ? ?Orders:  ?Orders Placed This Encounter  ?Procedures  ? HgB A1c  ? ?No orders of the defined types were placed in this  encounter. ? ? ? ? Procedures: ?No procedures performed ? ? ?Clinical Data: ?No additional findings. ? ? ?Subjective: ?No chief complaint on file. ? ? ?HPI ? ?Review of Systems ? ? ?Objective: ?Vital Signs: BP 114/77   Pulse 88   Ht '5\' 4"'$  (1.626 m)   Wt 104 lb (47.2 kg)   BMI 17.85 kg/m?  ? ?Physical Exam ? ?Ortho Exam ? ?Specialty Comments:  ?No specialty comments available. ? ?Imaging: ?No results found. ? ? ?PMFS History: ?Patient Active Problem List  ? Diagnosis Date Noted  ? Osteoporosis 01/17/2022  ? Hypothyroidism 11/13/2020  ? Prediabetes 11/13/2020  ? B12 deficiency 11/13/2020  ? COPD without exacerbation ([place]) 11/13/2020  ? Vitamin D deficiency 11/13/2020  ? Neuropathy 11/13/2020  ? Rheumatoid arthritis involving multiple sites ([place]) 11/13/2020  ? Mixed hyperlipidemia 11/13/2020  ? Aortic atherosclerosis ([place]) 11/13/2020  ? GERD (gastroesophageal reflux disease) 11/05/2020  ? Depression, recurrent ([place]) 09/22/2020  ? Essential hypertension 09/22/2020  ? Dehydration 08/13/2020  ? Hypokalemia 08/13/2020  ? Abnormal CT of the abdomen 06/02/2020  ? N&V (nausea and vomiting) 04/08/2020  ? Monoclonal gammopathy 04/03/2020  ? 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 03/26/2020  ? Hx of colonic polyps 05/03/2019  ? H/O Clostridium difficile infection 05/03/2019  ? Normocytic anemia 05/03/2019  ? Chronic diarrhea 01/29/2019  ? ?Past  Medical History:  ?Diagnosis Date  ? Aortic atherosclerosis ([place]) 11/13/2020  ? CKD (chronic kidney disease) stage 2, GFR 60-89 ml/min   ? COPD (chronic obstructive pulmonary disease) ([place])   ? COVID-19 06/2019  ? Depression   ? Essential hypertension   ? GERD (gastroesophageal reflux disease)   ? History of diabetes mellitus   ? Hyperlipidemia   ? Hypothyroidism   ? Neuropathy   ? Rheumatoid arthritis ([place])   ? Vitamin B12 deficiency   ? Vitamin D deficiency   ?  ?Family History  ?Problem Relation Age of Onset  ? Stroke Mother   ? Heart disease Mother   ? Diabetes  Mother   ? Breast cancer Mother   ? Other Father   ?     stomach taken out for some reason  ? Migraines Daughter   ? Colon cancer Neg Hx   ?  ?Past Surgical History:  ?Procedure Laterality Date  ? BIOPSY  09/16/2019  ? Procedure: BIOPSY;  Surgeon: Daneil Dolin, MD;  [facility];  Service: Endoscopy;;  ? BIOPSY  04/28/2020  ? Procedure: BIOPSY;  Surgeon: Daneil Dolin, MD;  [facility];  Service: Endoscopy;;  ? BIOPSY  06/08/2020  ? Procedure: BIOPSY;  Surgeon: Daneil Dolin, MD;  [facility];  Service: Endoscopy;;  ? CATARACT EXTRACTION Bilateral   ? COLONOSCOPY  09/2010  ? Dr. Posey Pronto: mild diverticulsis in sigmoid colon.   ? COLONOSCOPY WITH PROPOFOL N/A 09/16/2019  ? Rourk: Diverticulosis, random colon biopsies negative for microscopic colitis.  ? COLONOSCOPY WITH PROPOFOL N/A 06/08/2020  ? Procedure: COLONOSCOPY WITH PROPOFOL;  Surgeon: Daneil Dolin, MD;  [facility];  Service: Endoscopy;  Laterality: N/A;  9:15am  ? ESOPHAGOGASTRODUODENOSCOPY (EGD) WITH PROPOFOL N/A 04/28/2020  ? Procedure: ESOPHAGOGASTRODUODENOSCOPY (EGD) WITH PROPOFOL;  Surgeon: Daneil Dolin, MD;  [facility];  Service: Endoscopy;  Laterality: N/A;  2:15  ? YAG LASER APPLICATION Left 57/84/6962  ? Procedure: YAG LASER APPLICATION;  Surgeon: Williams Che, MD;  Location: [facility];  Service: Ophthalmology;  Laterality: Left;  ? ?Social History  ? ?Occupational History  ? Occupation: DISABLED  ?Tobacco Use  ? Smoking status: Former  ?  Packs/day: 1.00  ?  Years: 15.00  ?  Pack years: 15.00  ?  Types: Cigarettes  ?  Quit date: 09/11/2017  ?  Years since quitting: 4.3  ? Smokeless tobacco: Former  ?Vaping Use  ? Vaping Use: Never used  ?Substance and Sexual Activity  ? Alcohol use: Never  ? Drug use: Never  ? Sexual activity: Not Currently  ? ? ? ? ? ? ?

## 2022-01-29 LAB — EXTRA SPECIMEN

## 2022-01-29 LAB — HEMOGLOBIN A1C
Hgb A1c MFr Bld: 4.8 % of total Hgb (ref <5.7)
Mean Plasma Glucose: 91 mg/dL
eAG (mmol/L): 5 mmol/L

## 2022-01-31 ENCOUNTER — Encounter: Payer: Self-pay | Admitting: Specialist

## 2022-02-02 NOTE — Telephone Encounter (Signed)
Printed for JN 

## 2022-02-11 ENCOUNTER — Other Ambulatory Visit (HOSPITAL_COMMUNITY): Payer: Self-pay | Admitting: Hematology

## 2022-02-11 ENCOUNTER — Encounter: Payer: Self-pay | Admitting: Specialist

## 2022-02-11 NOTE — Telephone Encounter (Signed)
I called and spoke with daughter and explained the  Situation, her HgbA1c is good, her nutrition state as judged by her albumin level is poor and her bone density is poor. I know she hurts but there are things that can be done to improve these conditions before surgery is done that may prevent wound complications and complications Of hardware pulling loose from weak bone.  If she is unable to tolerate the pain then surgery is reasonable but with the understanding that the Risks are there and are greater than if we were to wait and treat the poor nutrition and use stronger bone building agents like forteo to improve the chances of hardware remaining stable and not loosening. She said she would discuss with her mother. I also offered to have her have a 2nd opinion and she said that was not Necessary.

## 2022-02-22 ENCOUNTER — Other Ambulatory Visit (HOSPITAL_COMMUNITY): Payer: Self-pay | Admitting: *Deleted

## 2022-02-22 DIAGNOSIS — D649 Anemia, unspecified: Secondary | ICD-10-CM

## 2022-02-22 DIAGNOSIS — E876 Hypokalemia: Secondary | ICD-10-CM

## 2022-02-22 DIAGNOSIS — D500 Iron deficiency anemia secondary to blood loss (chronic): Secondary | ICD-10-CM

## 2022-02-23 ENCOUNTER — Inpatient Hospital Stay (HOSPITAL_COMMUNITY): Payer: Medicare Other

## 2022-02-23 ENCOUNTER — Inpatient Hospital Stay (HOSPITAL_COMMUNITY): Payer: Medicare Other | Attending: Hematology

## 2022-02-23 ENCOUNTER — Encounter (HOSPITAL_COMMUNITY): Payer: Self-pay

## 2022-02-23 VITALS — BP 115/69 | HR 71 | Temp 97.4°F | Resp 18 | Wt 103.8 lb

## 2022-02-23 DIAGNOSIS — E876 Hypokalemia: Secondary | ICD-10-CM

## 2022-02-23 DIAGNOSIS — E539 Vitamin B deficiency, unspecified: Secondary | ICD-10-CM | POA: Insufficient documentation

## 2022-02-23 DIAGNOSIS — M810 Age-related osteoporosis without current pathological fracture: Secondary | ICD-10-CM | POA: Insufficient documentation

## 2022-02-23 DIAGNOSIS — F32A Depression, unspecified: Secondary | ICD-10-CM | POA: Insufficient documentation

## 2022-02-23 DIAGNOSIS — R634 Abnormal weight loss: Secondary | ICD-10-CM | POA: Diagnosis not present

## 2022-02-23 DIAGNOSIS — R803 Bence Jones proteinuria: Secondary | ICD-10-CM | POA: Diagnosis not present

## 2022-02-23 DIAGNOSIS — E860 Dehydration: Secondary | ICD-10-CM

## 2022-02-23 DIAGNOSIS — D649 Anemia, unspecified: Secondary | ICD-10-CM | POA: Insufficient documentation

## 2022-02-23 DIAGNOSIS — D500 Iron deficiency anemia secondary to blood loss (chronic): Secondary | ICD-10-CM

## 2022-02-23 LAB — CBC WITH DIFFERENTIAL/PLATELET
Abs Immature Granulocytes: 0.02 10*3/uL (ref 0.00–0.07)
Basophils Absolute: 0 10*3/uL (ref 0.0–0.1)
Basophils Relative: 0 %
Eosinophils Absolute: 0.2 10*3/uL (ref 0.0–0.5)
Eosinophils Relative: 3 %
HCT: 29.8 % — ABNORMAL LOW (ref 36.0–46.0)
Hemoglobin: 8.3 g/dL — ABNORMAL LOW (ref 12.0–15.0)
Immature Granulocytes: 0 %
Lymphocytes Relative: 22 %
Lymphs Abs: 1.3 10*3/uL (ref 0.7–4.0)
MCH: 25.2 pg — ABNORMAL LOW (ref 26.0–34.0)
MCHC: 27.9 g/dL — ABNORMAL LOW (ref 30.0–36.0)
MCV: 90.3 fL (ref 80.0–100.0)
Monocytes Absolute: 0.6 10*3/uL (ref 0.1–1.0)
Monocytes Relative: 11 %
Neutro Abs: 3.5 10*3/uL (ref 1.7–7.7)
Neutrophils Relative %: 64 %
Platelets: 272 10*3/uL (ref 150–400)
RBC: 3.3 MIL/uL — ABNORMAL LOW (ref 3.87–5.11)
RDW: 16 % — ABNORMAL HIGH (ref 11.5–15.5)
WBC: 5.6 10*3/uL (ref 4.0–10.5)
nRBC: 0 % (ref 0.0–0.2)

## 2022-02-23 LAB — COMPREHENSIVE METABOLIC PANEL
ALT: 6 U/L (ref 0–44)
AST: 11 U/L — ABNORMAL LOW (ref 15–41)
Albumin: 2.8 g/dL — ABNORMAL LOW (ref 3.5–5.0)
Alkaline Phosphatase: 111 U/L (ref 38–126)
Anion gap: 6 (ref 5–15)
BUN: 7 mg/dL — ABNORMAL LOW (ref 8–23)
CO2: 27 mmol/L (ref 22–32)
Calcium: 8.1 mg/dL — ABNORMAL LOW (ref 8.9–10.3)
Chloride: 102 mmol/L (ref 98–111)
Creatinine, Ser: 0.84 mg/dL (ref 0.44–1.00)
GFR, Estimated: >60 mL/min (ref >60)
Glucose, Bld: 159 mg/dL — ABNORMAL HIGH (ref 70–99)
Potassium: 3.9 mmol/L (ref 3.5–5.1)
Sodium: 135 mmol/L (ref 135–145)
Total Bilirubin: 0.3 mg/dL (ref 0.3–1.2)
Total Protein: 5.2 g/dL — ABNORMAL LOW (ref 6.5–8.1)

## 2022-02-23 LAB — LACTATE DEHYDROGENASE: LDH: 141 U/L (ref 98–192)

## 2022-02-23 LAB — TSH: TSH: 11.482 u[IU]/mL — ABNORMAL HIGH (ref 0.350–4.500)

## 2022-02-23 LAB — MAGNESIUM: Magnesium: 1.9 mg/dL (ref 1.7–2.4)

## 2022-02-23 MED ORDER — CYANOCOBALAMIN 1000 MCG/ML IJ SOLN
1000.0000 ug | Freq: Once | INTRAMUSCULAR | Status: AC
  Administered 2022-02-23: 1000 ug via INTRAMUSCULAR
  Filled 2022-02-23: qty 1

## 2022-02-23 MED ORDER — SODIUM CHLORIDE 0.9 % IV SOLN: Freq: Once | INTRAVENOUS | Status: AC

## 2022-02-23 MED ORDER — SODIUM CHLORIDE 0.9 % IV SOLN: Freq: Once | INTRAVENOUS | Status: DC

## 2022-02-23 MED ORDER — ZOLEDRONIC ACID 4 MG/100ML IV SOLN
4.0000 mg | Freq: Once | INTRAVENOUS | Status: AC
  Administered 2022-02-23: 4 mg via INTRAVENOUS
  Filled 2022-02-23: qty 100

## 2022-02-23 MED ORDER — ZOLEDRONIC ACID 5 MG/100ML IV SOLN: 5.0000 mg | Freq: Once | INTRAVENOUS | Status: DC

## 2022-02-23 NOTE — Patient Instructions (Signed)
[facility]  Discharge Instructions: Thank you for choosing [facility] to provide your oncology and hematology care.  If you have a lab appointment with the [facility], please come in thru the Main Entrance and check in at the main information desk.  Wear comfortable clothing and clothing appropriate for easy access to any Portacath or PICC line.   We strive to give you quality time with your provider. You may need to reschedule your appointment if you arrive late (15 or more minutes).  Arriving late affects you and other patients whose appointments are after yours.  Also, if you miss three or more appointments without notifying the office, you may be dismissed from the clinic at the provider's discretion.      For prescription refill requests, have your pharmacy contact our office and allow 72 hours for refills to be completed.    Today you received the following chemotherapy and/or immunotherapy agents Zometa.       To help prevent nausea and vomiting after your treatment, we encourage you to take your nausea medication as directed.  BELOW ARE SYMPTOMS THAT SHOULD BE REPORTED IMMEDIATELY: *FEVER GREATER THAN 100.4 F (38 C) OR HIGHER *CHILLS OR SWEATING *NAUSEA AND VOMITING THAT IS NOT CONTROLLED WITH YOUR NAUSEA MEDICATION *UNUSUAL SHORTNESS OF BREATH *UNUSUAL BRUISING OR BLEEDING *URINARY PROBLEMS (pain or burning when urinating, or frequent urination) *BOWEL PROBLEMS (unusual diarrhea, constipation, pain near the anus) TENDERNESS IN MOUTH AND THROAT WITH OR WITHOUT PRESENCE OF ULCERS (sore throat, sores in mouth, or a toothache) UNUSUAL RASH, SWELLING OR PAIN  UNUSUAL VAGINAL DISCHARGE OR ITCHING   Items with * indicate a potential emergency and should be followed up as soon as possible or go to the Emergency Department if any problems should occur.  Please show the CHEMOTHERAPY ALERT CARD or IMMUNOTHERAPY ALERT CARD at check-in to the Emergency  Department and triage nurse.  Should you have questions after your visit or need to cancel or reschedule your appointment, please contact [facility] [phone]  and follow the prompts.  Office hours are 8:00 a.m. to 4:30 p.m. Monday - Friday. Please note that voicemails left after 4:00 p.m. may not be returned until the following business day.  We are closed weekends and major holidays. You have access to a nurse at all times for urgent questions. Please call the main number to the clinic [phone] and follow the prompts.  For any non-urgent questions, you may also contact your provider using MyChart. We now offer e-Visits for anyone 34 and older to request care online for non-urgent symptoms. For details visit [email or web address].   Also download the MyChart app! Go to the app store, search "MyChart", open the app, select [facility], and log in with your MyChart username and password.  Due to Covid, a mask is required upon entering the hospital/clinic. If you do not have a mask, one will be given to you upon arrival. For doctor visits, patients may have 1 support person aged 8 or older with them. For treatment visits, patients cannot have anyone with them due to current Covid guidelines and our immunocompromised population.

## 2022-02-23 NOTE — Progress Notes (Signed)
Insurance denied Prolia - insurance required first drug to be zoledronic acid  Henreitta Leber, PharmD

## 2022-02-23 NOTE — Progress Notes (Signed)
Patient presents today for Zometa infusion. Patient reports she is taking Calcium and Vitamin d supplements at home. Calcium 8.1.  Corrected calcium 9.6. Patient denies any jaw pain or upcoming major dental work. Patient given Zometa information and teaching performed. Understanding verbalized.   Zometa given today per MD orders. Tolerated infusion without adverse affects. Vital signs stable. No complaints at this time. Discharged from clinic ambulatory in stable condition. Alert and oriented x 3. F/U with [facility] as scheduled.

## 2022-03-02 ENCOUNTER — Other Ambulatory Visit (HOSPITAL_COMMUNITY): Payer: Self-pay | Admitting: Hematology

## 2022-03-02 ENCOUNTER — Other Ambulatory Visit: Payer: Self-pay | Admitting: Family Medicine

## 2022-03-02 DIAGNOSIS — E039 Hypothyroidism, unspecified: Secondary | ICD-10-CM

## 2022-03-02 DIAGNOSIS — K219 Gastro-esophageal reflux disease without esophagitis: Secondary | ICD-10-CM

## 2022-03-02 DIAGNOSIS — E782 Mixed hyperlipidemia: Secondary | ICD-10-CM

## 2022-03-02 DIAGNOSIS — G629 Polyneuropathy, unspecified: Secondary | ICD-10-CM

## 2022-03-02 DIAGNOSIS — E876 Hypokalemia: Secondary | ICD-10-CM

## 2022-03-02 DIAGNOSIS — E1165 Type 2 diabetes mellitus with hyperglycemia: Secondary | ICD-10-CM

## 2022-03-02 DIAGNOSIS — I700 Atherosclerosis of aorta: Secondary | ICD-10-CM

## 2022-03-03 ENCOUNTER — Encounter (HOSPITAL_COMMUNITY): Payer: Self-pay

## 2022-03-04 ENCOUNTER — Ambulatory Visit (INDEPENDENT_AMBULATORY_CARE_PROVIDER_SITE_OTHER): Payer: Medicare Other | Admitting: Specialist

## 2022-03-04 ENCOUNTER — Other Ambulatory Visit (HOSPITAL_COMMUNITY): Payer: Self-pay

## 2022-03-04 ENCOUNTER — Encounter: Payer: Self-pay | Admitting: Specialist

## 2022-03-04 VITALS — BP 117/74 | HR 85 | Ht 64.0 in | Wt 103.0 lb

## 2022-03-04 DIAGNOSIS — M4726 Other spondylosis with radiculopathy, lumbar region: Secondary | ICD-10-CM | POA: Diagnosis not present

## 2022-03-04 DIAGNOSIS — M818 Other osteoporosis without current pathological fracture: Secondary | ICD-10-CM

## 2022-03-04 DIAGNOSIS — G894 Chronic pain syndrome: Secondary | ICD-10-CM

## 2022-03-04 DIAGNOSIS — M1712 Unilateral primary osteoarthritis, left knee: Secondary | ICD-10-CM | POA: Diagnosis not present

## 2022-03-04 DIAGNOSIS — M1711 Unilateral primary osteoarthritis, right knee: Secondary | ICD-10-CM

## 2022-03-04 MED ORDER — METHYLPREDNISOLONE ACETATE 40 MG/ML IJ SUSP
40.0000 mg | INTRAMUSCULAR | Status: AC | PRN
  Administered 2022-03-04: 40 mg via INTRA_ARTICULAR

## 2022-03-04 MED ORDER — BUPIVACAINE HCL 0.5 % IJ SOLN
5.0000 mL | INTRAMUSCULAR | Status: AC | PRN
  Administered 2022-03-04: 5 mL via INTRA_ARTICULAR

## 2022-03-04 MED ORDER — MEGESTROL ACETATE 400 MG/10ML PO SUSP: 400.0000 mg | Freq: Two times a day (BID) | ORAL | 2 refills | Status: DC

## 2022-03-04 NOTE — Patient Instructions (Addendum)
  Plan: Avoid bending, stooping and avoid lifting weights greater than 10 lbs. Avoid prolong standing and walking. Avoid frequent bending and stooping  No lifting greater than 10 lbs. May use ice or moist heat for pain. Weight loss is of benefit. Handicap license is approved. Physical therapy for WB exercises and knee extension exercises Knee is suffering from osteoarthritis, only real proven treatments are Weight loss, NSIADs like diclofenac gel and exercise. Well padded shoes help. Ice the knee that is suffering from osteoarthritis, only real proven treatments are Weight loss, NSIADs like diclofenac gel and exercise. Well padded shoes help. Ice the knee 2-3 times a day 15-20 mins at a time.3 times a day 15-20 mins at a time. Hot showers in the AM.  Injection with steroid injection may be of benefit. Hemp CBD capsules, amazon.com 5,000-7,000 mg per bottle, 60 capsules per bottle, take one capsule twice a day. Cane in the right hand to use with right leg weight bearing. Follow-Up Instructions: No follow-ups on file.   Follow-Up Instructions: No follow-ups on file.    Orders:

## 2022-03-04 NOTE — Progress Notes (Signed)
Office Visit Note   Patient: Erica Beasley           Date of Birth: [birth year]           MRN: [id number] Visit Date: 03/04/2022              Requested by: Loman Brooklyn, [place],  [place] [id number] PCP: Loman Brooklyn, FNP   Assessment & Plan: Visit Diagnoses:  1. Unilateral primary osteoarthritis, left knee   2. Unilateral primary osteoarthritis, right knee   3. Other spondylosis with radiculopathy, lumbar region   4. Other osteoporosis without current pathological fracture   5. Chronic pain syndrome     Plan: Avoid bending, stooping and avoid lifting weights greater than 10 lbs. Avoid prolong standing and walking. Avoid frequent bending and stooping  No lifting greater than 10 lbs. May use ice or moist heat for pain. Weight loss is of benefit. Handicap license is approved.  Knee is suffering from osteoarthritis, only real proven treatments are Weight loss, NSIADs like diclofenac gel and exercise. Well padded shoes help. Ice the knee that is suffering from osteoarthritis, only real proven treatments are Weight loss, NSIADs like diclofenac gel and exercise. Well padded shoes help. Ice the knee 2-3 times a day 15-20 mins at a time.3 times a day 15-20 mins at a time. Hot showers in the AM.  Injection with steroid injection may be of benefit. Hemp CBD capsules, amazon.com 5,000-7,000 mg per bottle, 60 capsules per bottle, take one capsule twice a day. Cane in the right hand to use with right leg weight bearing. Follow-Up Instructions: No follow-ups on file.   Follow-Up Instructions: No follow-ups on file.   Orders:  No orders of the defined types were placed in this encounter.  No orders of the defined types were placed in this encounter.     Procedures: Large Joint Inj: bilateral knee on 03/04/2022 11:05 AM Indications: pain Details: 25 G 1.5 in needle, anterolateral approach  Arthrogram: No  Medications (Right): 5 mL bupivacaine 0.5 %; 40 mg  methylPREDNISolone acetate 40 MG/ML Outcome: tolerated well, no immediate complications Procedure, treatment alternatives, risks and benefits explained, specific risks discussed. Consent was given by the patient. Immediately prior to procedure a time out was called to verify the correct patient, procedure, equipment, support staff and site/side marked as required. Patient was prepped and draped in the usual sterile fashion.     Clinical Data: No additional findings.   Subjective: No chief complaint on file.   HPI  Review of Systems   Objective: Vital Signs: BP 117/74 (BP Location: Left Arm, Patient Position: Sitting, Cuff Size: Large)   Pulse 85   Ht '5\' 4"'$  (1.626 m)   Wt 103 lb (46.7 kg)   BMI 17.68 kg/m   Physical Exam  Ortho Exam  Specialty Comments:  No specialty comments available.  Imaging: No results found.   PMFS History: Patient Active Problem List   Diagnosis Date Noted  . Osteoporosis 01/17/2022  . Hypothyroidism 11/13/2020  . Prediabetes 11/13/2020  . B12 deficiency 11/13/2020  . COPD without exacerbation ([place]) 11/13/2020  . Vitamin D deficiency 11/13/2020  . Neuropathy 11/13/2020  . Rheumatoid arthritis involving multiple sites ([place]) 11/13/2020  . Mixed hyperlipidemia 11/13/2020  . Aortic atherosclerosis ([place]) 11/13/2020  . GERD (gastroesophageal reflux disease) 11/05/2020  . Depression, recurrent ([place]) 09/22/2020  . Essential hypertension 09/22/2020  . Dehydration 08/13/2020  . Hypokalemia 08/13/2020  . Abnormal CT of the  abdomen 06/02/2020  . N&V (nausea and vomiting) 04/08/2020  . Monoclonal gammopathy 04/03/2020  . 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 03/26/2020  . Hx of colonic polyps 05/03/2019  . H/O Clostridium difficile infection 05/03/2019  . Normocytic anemia 05/03/2019  . Chronic diarrhea 01/29/2019   Past Medical History:  Diagnosis Date  . Aortic atherosclerosis ([place]) 11/13/2020  . CKD (chronic kidney  disease) stage 2, GFR 60-89 ml/min   . COPD (chronic obstructive pulmonary disease) ([place])   . COVID-19 06/2019  . Depression   . Essential hypertension   . GERD (gastroesophageal reflux disease)   . History of diabetes mellitus   . Hyperlipidemia   . Hypothyroidism   . Neuropathy   . Rheumatoid arthritis ([place])   . Vitamin B12 deficiency   . Vitamin D deficiency     Family History  Problem Relation Age of Onset  . Stroke Mother   . Heart disease Mother   . Diabetes Mother   . Breast cancer Mother   . Other Father        stomach taken out for some reason  . Migraines Daughter   . Colon cancer Neg Hx     Past Surgical History:  Procedure Laterality Date  . BIOPSY  09/16/2019   Procedure: BIOPSY;  Surgeon: Daneil Dolin, MD;  [facility];  Service: Endoscopy;;  . BIOPSY  04/28/2020   Procedure: BIOPSY;  Surgeon: Daneil Dolin, MD;  [facility];  Service: Endoscopy;;  . BIOPSY  06/08/2020   Procedure: BIOPSY;  Surgeon: Daneil Dolin, MD;  [facility];  Service: Endoscopy;;  . CATARACT EXTRACTION Bilateral   . COLONOSCOPY  09/2010   Dr. Posey Pronto: mild diverticulsis in sigmoid colon.   . COLONOSCOPY WITH PROPOFOL N/A 09/16/2019   Rourk: Diverticulosis, random colon biopsies negative for microscopic colitis.  . COLONOSCOPY WITH PROPOFOL N/A 06/08/2020   Procedure: COLONOSCOPY WITH PROPOFOL;  Surgeon: Daneil Dolin, MD;  [facility];  Service: Endoscopy;  Laterality: N/A;  9:15am  . ESOPHAGOGASTRODUODENOSCOPY (EGD) WITH PROPOFOL N/A 04/28/2020   Procedure: ESOPHAGOGASTRODUODENOSCOPY (EGD) WITH PROPOFOL;  Surgeon: Daneil Dolin, MD;  [facility];  Service: Endoscopy;  Laterality: N/A;  2:15  . YAG LASER APPLICATION Left 17/40/8144   Procedure: YAG LASER APPLICATION;  Surgeon: Williams Che, MD;  Location: [facility];  Service: Ophthalmology;  Laterality: Left;   Social History   Occupational History  . Occupation:  DISABLED  Tobacco Use  . Smoking status: Former    Packs/day: 1.00    Years: 15.00    Total pack years: 15.00    Types: Cigarettes    Quit date: 09/11/2017    Years since quitting: 4.4  . Smokeless tobacco: Former  Media pl[employer]  . Vaping Use: Never used  Substance and Sexual Activity  . Alcohol use: Never  . Drug use: Never  . Sexual activity: Not Currently

## 2022-03-07 ENCOUNTER — Inpatient Hospital Stay (HOSPITAL_COMMUNITY): Payer: Medicare Other | Attending: Hematology

## 2022-03-07 DIAGNOSIS — Z79899 Other long term (current) drug therapy: Secondary | ICD-10-CM | POA: Insufficient documentation

## 2022-03-07 DIAGNOSIS — R634 Abnormal weight loss: Secondary | ICD-10-CM | POA: Insufficient documentation

## 2022-03-07 DIAGNOSIS — E876 Hypokalemia: Secondary | ICD-10-CM | POA: Diagnosis not present

## 2022-03-07 DIAGNOSIS — R803 Bence Jones proteinuria: Secondary | ICD-10-CM | POA: Diagnosis not present

## 2022-03-07 DIAGNOSIS — M069 Rheumatoid arthritis, unspecified: Secondary | ICD-10-CM | POA: Diagnosis not present

## 2022-03-07 DIAGNOSIS — F32A Depression, unspecified: Secondary | ICD-10-CM | POA: Insufficient documentation

## 2022-03-07 DIAGNOSIS — D472 Monoclonal gammopathy: Secondary | ICD-10-CM

## 2022-03-07 DIAGNOSIS — Z803 Family history of malignant neoplasm of breast: Secondary | ICD-10-CM | POA: Diagnosis not present

## 2022-03-07 DIAGNOSIS — M810 Age-related osteoporosis without current pathological fracture: Secondary | ICD-10-CM | POA: Insufficient documentation

## 2022-03-07 DIAGNOSIS — D539 Nutritional anemia, unspecified: Secondary | ICD-10-CM | POA: Insufficient documentation

## 2022-03-07 DIAGNOSIS — D649 Anemia, unspecified: Secondary | ICD-10-CM

## 2022-03-07 LAB — IRON AND TIBC
Iron: 33 ug/dL (ref 28–170)
Saturation Ratios: 18 % (ref 10.4–31.8)
TIBC: 188 ug/dL — ABNORMAL LOW (ref 250–450)
UIBC: 155 ug/dL

## 2022-03-07 LAB — CBC WITH DIFFERENTIAL/PLATELET
Abs Immature Granulocytes: 0.01 10*3/uL (ref 0.00–0.07)
Basophils Absolute: 0 10*3/uL (ref 0.0–0.1)
Basophils Relative: 0 %
Eosinophils Absolute: 0.1 10*3/uL (ref 0.0–0.5)
Eosinophils Relative: 1 %
HCT: 33.8 % — ABNORMAL LOW (ref 36.0–46.0)
Hemoglobin: 9.2 g/dL — ABNORMAL LOW (ref 12.0–15.0)
Immature Granulocytes: 0 %
Lymphocytes Relative: 36 %
Lymphs Abs: 2 10*3/uL (ref 0.7–4.0)
MCH: 25 pg — ABNORMAL LOW (ref 26.0–34.0)
MCHC: 27.2 g/dL — ABNORMAL LOW (ref 30.0–36.0)
MCV: 91.8 fL (ref 80.0–100.0)
Monocytes Absolute: 0.6 10*3/uL (ref 0.1–1.0)
Monocytes Relative: 11 %
Neutro Abs: 2.9 10*3/uL (ref 1.7–7.7)
Neutrophils Relative %: 52 %
Platelets: 359 10*3/uL (ref 150–400)
RBC: 3.68 MIL/uL — ABNORMAL LOW (ref 3.87–5.11)
RDW: 16.7 % — ABNORMAL HIGH (ref 11.5–15.5)
WBC: 5.6 10*3/uL (ref 4.0–10.5)
nRBC: 0 % (ref 0.0–0.2)

## 2022-03-07 LAB — FERRITIN: Ferritin: 392 ng/mL — ABNORMAL HIGH (ref 11–307)

## 2022-03-08 LAB — KAPPA/LAMBDA LIGHT CHAINS
Kappa free light chain: 39.3 mg/L — ABNORMAL HIGH (ref 3.3–19.4)
Kappa, lambda light chain ratio: 1.78 — ABNORMAL HIGH (ref 0.26–1.65)
Lambda free light chains: 22.1 mg/L (ref 5.7–26.3)

## 2022-03-09 LAB — PROTEIN ELECTROPHORESIS, SERUM
A/G Ratio: 0.9 (ref 0.7–1.7)
Albumin ELP: 2.8 g/dL — ABNORMAL LOW (ref 2.9–4.4)
Alpha-1-Globulin: 0.3 g/dL (ref 0.0–0.4)
Alpha-2-Globulin: 0.9 g/dL (ref 0.4–1.0)
Beta Globulin: 1 g/dL (ref 0.7–1.3)
Gamma Globulin: 1 g/dL (ref 0.4–1.8)
Globulin, Total: 3.2 g/dL (ref 2.2–3.9)
Total Protein ELP: 6 g/dL (ref 6.0–8.5)

## 2022-03-10 LAB — IMMUNOFIXATION ELECTROPHORESIS
IgA: 385 mg/dL — ABNORMAL HIGH (ref 87–352)
IgG (Immunoglobin G), Serum: 1162 mg/dL (ref 586–1602)
IgM (Immunoglobulin M), Srm: 136 mg/dL (ref 26–217)
Total Protein ELP: 6.1 g/dL (ref 6.0–8.5)

## 2022-03-14 ENCOUNTER — Inpatient Hospital Stay (HOSPITAL_BASED_OUTPATIENT_CLINIC_OR_DEPARTMENT_OTHER): Payer: Medicare Other | Admitting: Hematology

## 2022-03-14 ENCOUNTER — Other Ambulatory Visit (HOSPITAL_COMMUNITY): Payer: Self-pay | Admitting: *Deleted

## 2022-03-14 VITALS — BP 117/77 | HR 88 | Temp 97.0°F | Resp 18 | Ht 64.0 in | Wt 98.2 lb

## 2022-03-14 DIAGNOSIS — M810 Age-related osteoporosis without current pathological fracture: Secondary | ICD-10-CM | POA: Diagnosis not present

## 2022-03-14 DIAGNOSIS — D500 Iron deficiency anemia secondary to blood loss (chronic): Secondary | ICD-10-CM

## 2022-03-14 DIAGNOSIS — D649 Anemia, unspecified: Secondary | ICD-10-CM

## 2022-03-14 DIAGNOSIS — R803 Bence Jones proteinuria: Secondary | ICD-10-CM

## 2022-03-14 DIAGNOSIS — D539 Nutritional anemia, unspecified: Secondary | ICD-10-CM | POA: Diagnosis not present

## 2022-03-14 DIAGNOSIS — R634 Abnormal weight loss: Secondary | ICD-10-CM | POA: Diagnosis not present

## 2022-03-14 DIAGNOSIS — E538 Deficiency of other specified B group vitamins: Secondary | ICD-10-CM

## 2022-03-14 DIAGNOSIS — E876 Hypokalemia: Secondary | ICD-10-CM | POA: Diagnosis not present

## 2022-03-14 DIAGNOSIS — F32A Depression, unspecified: Secondary | ICD-10-CM | POA: Diagnosis not present

## 2022-03-14 DIAGNOSIS — Z803 Family history of malignant neoplasm of breast: Secondary | ICD-10-CM | POA: Diagnosis not present

## 2022-03-14 DIAGNOSIS — Z79899 Other long term (current) drug therapy: Secondary | ICD-10-CM | POA: Diagnosis not present

## 2022-03-14 DIAGNOSIS — M069 Rheumatoid arthritis, unspecified: Secondary | ICD-10-CM | POA: Diagnosis not present

## 2022-03-14 NOTE — Patient Instructions (Addendum)
Erica Beasley at [facility] Discharge Instructions   You were seen and examined today by Dr. Delton Coombes.  He reviewed with the results of your lab work which is normal/stable.   You have lost 6 lbs since we last saw you. Please start taking the Megace 2 teaspoons twice a day to help increase your appetite so you can gain weight. You can also drink Ensure Plus or Boost Plus to help with your nutrition and weight loss.   Return as scheduled in about 4 months.    Thank you for choosing [facility] at [facility] to provide your oncology and hematology care.  To afford each patient quality time with our provider, please arrive at least 15 minutes before your scheduled appointment time.   If you have a lab appointment with the [facility] please come in thru the Main Entrance and check in at the main information desk.  You need to re-schedule your appointment should you arrive 10 or more minutes late.  We strive to give you quality time with our providers, and arriving late affects you and other patients whose appointments are after yours.  Also, if you no show three or more times for appointments you may be dismissed from the clinic at the providers discretion.     Again, thank you for choosing [facility].  Our hope is that these requests will decrease the amount of time that you wait before being seen by our physicians.       _____________________________________________________________  Should you have questions after your visit to [facility], please contact our office at [phone] and follow the prompts.  Our office hours are 8:00 a.m. and 4:30 p.m. Monday - Friday.  Please note that voicemails left after 4:00 p.m. may not be returned until the following business day.  We are closed weekends and major holidays.  You do have access to a nurse 24-7, just call the main number to the clinic [phone] and do not press any  options, hold on the line and a nurse will answer the phone.    For prescription refill requests, have your pharmacy contact our office and allow 72 hours.    Due to Covid, you will need to wear a mask upon entering the hospital. If you do not have a mask, a mask will be given to you at the Main Entrance upon arrival. For doctor visits, patients may have 1 support person age 64 or older with them. For treatment visits, patients can not have anyone with them due to social distancing guidelines and our immunocompromised population.

## 2022-03-14 NOTE — Progress Notes (Signed)
Erica Beasley, Erica Beasley   CLINIC:  Medical Oncology/Hematology  PCP:  Erica Beasley, Erica Beasley  [phone]  REASON FOR VISIT:  Follow-up for Bence-Jones proteinuria, monoclonal gammopathy, and normocytic anemia  PRIOR THERAPY: none  CURRENT THERAPY: surveillance  INTERVAL HISTORY:  Erica Beasley, a 66 y.o. female, returns for routine follow-up for her Bence-Jones proteinuria, monoclonal gammopathy, and normocytic anemia. Erica Beasley was last seen on 01/17/2022.  Today Erica Beasley reports feeling good. Erica Beasley continues to have back pain. Erica Beasley denies bleeding and new pains. Erica Beasley reports Erica Beasley is eating well. Erica Beasley was unable to get Marinol due to difficulties with insurance, but Erica Beasley will be picking up Megace today. Erica Beasley continues to take 125 MCG Synthroid. Erica Beasley has lost 6 lbs since her last visit. Erica Beasley does not drink Boost/Ensure.   REVIEW OF SYSTEMS:  Review of Systems  Constitutional:  Positive for unexpected weight change (-6 lbs). Negative for appetite change and fatigue.  HENT:   Negative for nosebleeds.   Respiratory:  Negative for hemoptysis.   Gastrointestinal:  Negative for blood in stool.  Genitourinary:  Negative for hematuria.   Musculoskeletal:  Positive for arthralgias (6/10 legs) and back pain (6/10).  All other systems reviewed and are negative.   PAST MEDICAL/SURGICAL HISTORY:  Past Medical History:  Diagnosis Date   Aortic atherosclerosis ([place]) 11/13/2020   CKD (chronic kidney disease) stage 2, GFR 60-89 ml/min    COPD (chronic obstructive pulmonary disease) ([place])    COVID-19 06/2019   Depression    Essential hypertension    GERD (gastroesophageal reflux disease)    History of diabetes mellitus    Hyperlipidemia    Hypothyroidism    Neuropathy    Rheumatoid arthritis (H[facility])    Vitamin B12 deficiency    Vitamin D deficiency    Past Surgical History:  Procedure Laterality Date   BIOPSY   09/16/2019   Procedure: BIOPSY;  Surgeon: Daneil Dolin, MD;  [facility];  Service: Endoscopy;;   BIOPSY  04/28/2020   Procedure: BIOPSY;  Surgeon: Daneil Dolin, MD;  [facility];  Service: Endoscopy;;   BIOPSY  06/08/2020   Procedure: BIOPSY;  Surgeon: Daneil Dolin, MD;  [facility];  Service: Endoscopy;;   CATARACT EXTRACTION Bilateral    COLONOSCOPY  09/2010   Dr. Posey Pronto: mild diverticulsis in sigmoid colon.    COLONOSCOPY WITH PROPOFOL N/A 09/16/2019   Rourk: Diverticulosis, random colon biopsies negative for microscopic colitis.   COLONOSCOPY WITH PROPOFOL N/A 06/08/2020   Procedure: COLONOSCOPY WITH PROPOFOL;  Surgeon: Daneil Dolin, MD;  [facility];  Service: Endoscopy;  Laterality: N/A;  9:15am   ESOPHAGOGASTRODUODENOSCOPY (EGD) WITH PROPOFOL N/A 04/28/2020   Procedure: ESOPHAGOGASTRODUODENOSCOPY (EGD) WITH PROPOFOL;  Surgeon: Daneil Dolin, MD;  [facility];  Service: Endoscopy;  Laterality: N/A;  8:10   YAG LASER APPLICATION Left 17/51/0258   Procedure: YAG LASER APPLICATION;  Surgeon: Williams Che, MD;  Location: [facility];  Service: Ophthalmology;  Laterality: Left;    SOCIAL HISTORY:  Social History   Socioeconomic History   Marital status: Widowed    Spouse name: Not on file   Number of children: 1   Years of education: Not on file   Highest education level: Not on file  Occupational History   Occupation: DISABLED  Tobacco Use   Smoking status: Former  Packs/day: 1.00    Years: 15.00    Total pack years: 15.00    Types: Cigarettes    Quit date: 09/11/2017    Years since quitting: 4.5   Smokeless tobacco: Former  Scientific laboratory technician Use: Never used  Substance and Sexual Activity   Alcohol use: Never   Drug use: Never   Sexual activity: Not Currently  Other Topics Concern   Not on file  Social History Narrative   Not on file   Social Determinants of Health   Financial Resource Strain:  Low Risk  (09/03/2020)   Overall Financial Resource Strain (CARDIA)    Difficulty of Paying Living Expenses: Not hard at all  Food Insecurity: No Food Insecurity (09/03/2020)   Hunger Vital Sign    Worried About Running Out of Food in the Last Year: Never true    [place] in the Last Year: Never true  Transportation Needs: No Transportation Needs (09/03/2020)   PRAPARE - Hydrologist (Medical): No    Lack of Transportation (Non-Medical): No  Physical Activity: Insufficiently Active (09/03/2020)   Exercise Vital Sign    Days of Exercise per Week: 3 days    Minutes of Exercise per Session: 30 min  Stress: No Stress Concern Present (09/03/2020)   [place]    Feeling of Stress : Only a little  Social Connections: Socially Isolated (09/03/2020)   Social Connection and Isolation Panel [NHANES]    Frequency of Communication with Friends and Family: More than three times a week    Frequency of Social Gatherings with Friends and Family: More than three times a week    Attends Religious Services: Never    Marine scientist or Organizations: No    Attends Archivist Meetings: Never    Marital Status: Widowed  Intimate Partner Violence: Not At Risk (09/03/2020)   Humiliation, Afraid, Rape, and Kick questionnaire    Fear of Current or Ex-Partner: No    Emotionally Abused: No    Physically Abused: No    Sexually Abused: No    FAMILY HISTORY:  Family History  Problem Relation Age of Onset   Stroke Mother    Heart disease Mother    Diabetes Mother    Breast cancer Mother    Other Father        stomach taken out for some reason   Migraines Daughter    Colon cancer Neg Hx     CURRENT MEDICATIONS:  Current Outpatient Medications  Medication Sig Dispense Refill   albuterol (VENTOLIN HFA) 108 (90 Base) MCG/ACT inhaler Inhale 2 puffs into the lungs every 6 (six) hours as needed  for wheezing or shortness of breath.     aspirin 81 MG EC tablet Take 1 tablet (81 mg total) by mouth daily. Swallow whole. 30 tablet 12   Cholecalciferol (VITAMIN D3) 125 MCG (5000 UT) TABS Take 1 tablet (5,000 Units total) by mouth daily. 30 tablet 3   citalopram (CELEXA) 40 MG tablet Take 1 tablet (40 mg total) by mouth daily. 30 tablet 5   dronabinol (MARINOL) 2.5 MG capsule Take 1 capsule (2.5 mg total) by mouth 2 (two) times daily before a meal. 60 capsule 3   folic acid (FOLVITE) 1 MG tablet Take 1 tablet (1 mg total) by mouth daily. 90 tablet 3   gabapentin (NEURONTIN) 300 MG capsule TAKE (1) CAPSULE THREE TIMES DAILY. [place]  capsule 0   golimumab (SIMPONI ARIA) 50 MG/4ML SOLN injection Inject into the vein.     HYDROcodone-acetaminophen (NORCO) 7.5-325 MG tablet Take 1 tablet by mouth every 6 (six) hours as needed for moderate pain. 30 tablet 0   hydroxychloroquine (PLAQUENIL) 200 MG tablet Take 200 mg by mouth daily.      leflunomide (ARAVA) 20 MG tablet Take 20 mg by mouth daily.     levothyroxine (SYNTHROID) 125 MCG tablet TAKE ONE TABLET EVERY MORNING 30 tablet 0   loperamide (IMODIUM A-D) 2 MG tablet Take 1 tablet (2 mg total) by mouth in the morning. Take second dose if needed (Patient taking differently: Take 2 mg by mouth as needed. Take second dose if needed) 30 tablet 0   megestrol (MEGACE) 400 MG/10ML suspension Take 10 mLs (400 mg total) by mouth 2 (two) times daily. 480 mL 2   methocarbamol (ROBAXIN) 500 MG tablet Take 1 tablet (500 mg total) by mouth 2 (two) times daily. 20 tablet 0   oxyCODONE-acetaminophen (PERCOCET/ROXICET) 5-325 MG tablet Take 1 tablet by mouth every 6 (six) hours as needed for severe pain. 10 tablet 0   Pancrelipase, Lip-Prot-Amyl, (CREON) 24000-76000 units CPEP Take 1-2 capsules (24,000-48,000 Units total) by mouth See admin instructions. Take 48,000 units (2 capsules) with meals and 24000 units (1 capsule) with snacks 240 capsule 3   pantoprazole (PROTONIX)  40 MG tablet TAKE ONE TABLET BY MOUTH DAILY BEFORE BREAKFAST 90 tablet 0   potassium chloride SA (KLOR-CON M) 20 MEQ tablet TAKE ONE TABLET ONCE DAILY, NEED DOCTOR APPT. 30 tablet 6   pravastatin (PRAVACHOL) 40 MG tablet TAKE ONE TABLET AT BEDTIME 90 tablet 0   predniSONE (DELTASONE) 5 MG tablet Take 5 mg by mouth daily. Pt states Erica Beasley takes with severe arthritic pain     sulfaDIAZINE 500 MG tablet Take 500 mg by mouth 2 (two) times daily.     sulfaSALAzine (AZULFIDINE) 500 MG tablet 1 tablet     traZODone (DESYREL) 100 MG tablet TAKE 1 TABLET AT BEDTIME AS NEEDED FOR SLEEP 90 tablet 0   No current facility-administered medications for this visit.    ALLERGIES:  No Active Allergies  PHYSICAL EXAM:  Performance status (ECOG): 1 - Symptomatic but completely ambulatory  There were no vitals filed for this visit. Wt Readings from Last 3 Encounters:  03/04/22 103 lb (46.7 kg)  02/23/22 103 lb 12.8 oz (47.1 kg)  01/28/22 104 lb (47.2 kg)   Physical Exam Vitals reviewed.  Constitutional:      Appearance: Normal appearance.  Cardiovascular:     Rate and Rhythm: Normal rate and regular rhythm.     Pulses: Normal pulses.     Heart sounds: Normal heart sounds.  Pulmonary:     Effort: Pulmonary effort is normal.     Breath sounds: Normal breath sounds.  Neurological:     General: No focal deficit present.     Mental Status: Erica Beasley is alert and oriented to person, place, and time.  Psychiatric:        Mood and Affect: Mood normal.        Behavior: Behavior normal.     LABORATORY DATA:  I have reviewed the labs as listed.     Latest Ref Rng & Units 03/07/2022   10:49 AM 02/23/2022   12:25 PM 01/10/2022   12:08 PM  CBC  WBC 4.0 - 10.5 K/uL 5.6  5.6  5.8   Hemoglobin 12.0 - 15.0 g/dL 9.2  8.3  8.2   Hematocrit 36.0 - 46.0 % 33.8  29.8  29.9   Platelets 150 - 400 K/uL 359  272  340       Latest Ref Rng & Units 02/23/2022   12:25 PM 01/10/2022   12:08 PM 12/01/2021   10:46 AM  CMP   Glucose 70 - 99 mg/dL 159  199  203   BUN 8 - 23 mg/dL '7  6  8   ' Creatinine 0.44 - 1.00 mg/dL 0.84  0.67  0.73   Sodium 135 - 145 mmol/L 135  137  132   Potassium 3.5 - 5.1 mmol/L 3.9  3.6  4.0   Chloride 98 - 111 mmol/L 102  102  99   CO2 22 - 32 mmol/L '27  26  24   ' Calcium 8.9 - 10.3 mg/dL 8.1  8.7  9.0   Total Protein 6.5 - 8.1 g/dL 5.2  5.8  6.1   Total Bilirubin 0.3 - 1.2 mg/dL 0.3  0.3  0.1   Alkaline Phos 38 - 126 U/L 111  114  100   AST 15 - 41 U/L '11  11  11   ' ALT 0 - 44 U/L '6  6  6       ' Component Value Date/Time   RBC 3.68 (L) 03/07/2022 1049   MCV 91.8 03/07/2022 1049   MCV 93 11/13/2020 1129   MCH 25.0 (L) 03/07/2022 1049   MCHC 27.2 (L) 03/07/2022 1049   RDW 16.7 (H) 03/07/2022 1049   RDW 12.7 11/13/2020 1129   LYMPHSABS 2.0 03/07/2022 1049   LYMPHSABS 2.4 11/13/2020 1129   MONOABS 0.6 03/07/2022 1049   EOSABS 0.1 03/07/2022 1049   EOSABS 0.1 11/13/2020 1129   BASOSABS 0.0 03/07/2022 1049   BASOSABS 0.0 11/13/2020 1129    DIAGNOSTIC IMAGING:  I have independently reviewed the scans and discussed with the patient. No results found.   ASSESSMENT:  1.  Bence-Jones proteinuria: -24-hour urine done by Dr. Theador Hawthorne showed urine immunofixation positive for Bence-Jones proteinuria, kappa type.  24-hour total protein was 77 mg/day. -Free kappa light chains were 52.9, lambda light chains 29.8 with ratio of 1.78.  Serum immunofixation was unremarkable.  SPEP did not show any evidence of M spike. -Labs on 04/03/2020 shows negative immunofixation.  Kappa light chain 61.3, lambda light chains 32.3, ratio 1.9. -Skeletal survey on 04/03/2020 was negative for lytic lesions.   2.  Weight loss: -25 pound weight loss in the last 2 months. -Erica Beasley smoked cigarettes 1 pack/day for 46 years, and quit 3 years ago. -Erica Beasley reports that Erica Beasley cannot eat due to decreased appetite.  Also feels very nauseous on smelling of foods. -EGD on 04/28/2020 with stomach and duodenal biopsy negative. -CT  CAP on 05/11/2020 showed long segment colonic thickening likely diverticular disease.  Stable small juxtapleural nodule in the right upper chest benign over 1 year.  Moderate large fat-containing left inguinal hernia.  Emphysema and diverticular calcification. -Colonoscopy on 06/08/2020 shows pancolonic diverticulosis, mild.  Normal-appearing colonic mucosa otherwise.  Random biopsies were negative.   3.  Normocytic anemia: -CBC on 03/20/2020 shows hemoglobin 9.9 with MCV of 93.  White count and platelets were normal. -Colonoscopy on 09/16/2019 shows diverticulosis in the entire examined colon.  Nonbleeding internal hemorrhoids. - BMBX on 07/19/2021: Mildly hypercellular marrow with trilineage hematopoiesis.  No increased ring sideroblasts or blasts.  3% polytypic plasma cells.  No large aggregates of plasma cells.  Iron stores increased.  Chromosome  analysis normal.   4.  Rheumatoid arthritis: -Erica Beasley is on Plaquenil and Areva for the last 4 years.   PLAN:  1.  Bence-Jones proteinuria: - Reviewed myeloma labs from 03/07/2022.  M spike is negative.  Creatinine and calcium are normal.  Free light chain ratio is slightly elevated and stable at 1.78.  Kappa light chains at 39.  Immunofixation was normal.   2.  Weight loss: - Erica Beasley lost 6 pounds in the last 2 months.  Erica Beasley could not start Marinol as her insurance did not cover it. - Erica Beasley will pick up Megace today and will start it.  I have recommended nutritional supplements like boost/Ensure with high-protein daily.   3.  Normocytic to macrocytic anemia: - Previous bone marrow biopsy showed trilineage hematopoiesis. - Stool for occult blood was negative x3. - Reviewed labs from 03/07/2022.  Hemoglobin improved to 9.2 and MCV of 91.  Ferritin was 392 and percent saturation of 18.  No further work-up needed.   4.  Depression: -Continue Celexa 40 mg daily.   5.  Hypokalemia: - Continue potassium 20 mEq daily.  Potassium is normal.  6.  Osteoporosis: -  DEXA scan on 01/05/2022 with T score -2.7. - I have recommended Prolia but her insurance did not cover it. - Erica Beasley received Zometa 4 mg on 02/23/2022. - Continue calcium supplements.  Orders placed this encounter:  No orders of the defined types were placed in this encounter.    Derek Jack, MD [place] [phone]   I, Thana Ates, am acting as a scribe for Dr. Derek Jack.  I, Derek Jack MD, have reviewed the above documentation for accuracy and completeness, and I agree with the above.

## 2022-03-23 DIAGNOSIS — M5459 Other low back pain: Secondary | ICD-10-CM | POA: Diagnosis not present

## 2022-04-01 ENCOUNTER — Other Ambulatory Visit: Payer: Self-pay | Admitting: Family Medicine

## 2022-04-01 DIAGNOSIS — E782 Mixed hyperlipidemia: Secondary | ICD-10-CM

## 2022-04-01 DIAGNOSIS — E039 Hypothyroidism, unspecified: Secondary | ICD-10-CM

## 2022-04-01 DIAGNOSIS — F339 Major depressive disorder, recurrent, unspecified: Secondary | ICD-10-CM

## 2022-04-01 DIAGNOSIS — G629 Polyneuropathy, unspecified: Secondary | ICD-10-CM

## 2022-04-01 DIAGNOSIS — I700 Atherosclerosis of aorta: Secondary | ICD-10-CM

## 2022-04-01 DIAGNOSIS — E1165 Type 2 diabetes mellitus with hyperglycemia: Secondary | ICD-10-CM

## 2022-04-01 NOTE — Telephone Encounter (Signed)
Last OV: 07/2021 No Future Visits Scheduled

## 2022-04-01 NOTE — Telephone Encounter (Signed)
Appointment scheduled on 7/26- can we send

## 2022-04-06 DIAGNOSIS — N182 Chronic kidney disease, stage 2 (mild): Secondary | ICD-10-CM | POA: Diagnosis not present

## 2022-04-06 DIAGNOSIS — E559 Vitamin D deficiency, unspecified: Secondary | ICD-10-CM | POA: Diagnosis not present

## 2022-04-06 DIAGNOSIS — R803 Bence Jones proteinuria: Secondary | ICD-10-CM | POA: Diagnosis not present

## 2022-04-06 DIAGNOSIS — I129 Hypertensive chronic kidney disease with stage 1 through stage 4 chronic kidney disease, or unspecified chronic kidney disease: Secondary | ICD-10-CM | POA: Diagnosis not present

## 2022-04-06 DIAGNOSIS — R769 Abnormal immunological finding in serum, unspecified: Secondary | ICD-10-CM | POA: Diagnosis not present

## 2022-04-13 ENCOUNTER — Encounter (HOSPITAL_COMMUNITY): Payer: Self-pay | Admitting: Hematology

## 2022-04-13 DIAGNOSIS — N182 Chronic kidney disease, stage 2 (mild): Secondary | ICD-10-CM | POA: Diagnosis not present

## 2022-04-13 DIAGNOSIS — Z7689 Persons encountering health services in other specified circumstances: Secondary | ICD-10-CM | POA: Diagnosis not present

## 2022-04-13 DIAGNOSIS — D638 Anemia in other chronic diseases classified elsewhere: Secondary | ICD-10-CM | POA: Diagnosis not present

## 2022-04-13 DIAGNOSIS — R803 Bence Jones proteinuria: Secondary | ICD-10-CM | POA: Diagnosis not present

## 2022-04-13 DIAGNOSIS — I129 Hypertensive chronic kidney disease with stage 1 through stage 4 chronic kidney disease, or unspecified chronic kidney disease: Secondary | ICD-10-CM | POA: Diagnosis not present

## 2022-04-14 DIAGNOSIS — M25561 Pain in right knee: Secondary | ICD-10-CM | POA: Diagnosis not present

## 2022-04-14 DIAGNOSIS — M0579 Rheumatoid arthritis with rheumatoid factor of multiple sites without organ or systems involvement: Secondary | ICD-10-CM | POA: Diagnosis not present

## 2022-04-14 DIAGNOSIS — Z681 Body mass index (BMI) 19 or less, adult: Secondary | ICD-10-CM | POA: Diagnosis not present

## 2022-04-14 DIAGNOSIS — L409 Psoriasis, unspecified: Secondary | ICD-10-CM | POA: Diagnosis not present

## 2022-04-14 DIAGNOSIS — L4059 Other psoriatic arthropathy: Secondary | ICD-10-CM | POA: Diagnosis not present

## 2022-04-14 DIAGNOSIS — M5136 Other intervertebral disc degeneration, lumbar region: Secondary | ICD-10-CM | POA: Diagnosis not present

## 2022-04-14 DIAGNOSIS — M1991 Primary osteoarthritis, unspecified site: Secondary | ICD-10-CM | POA: Diagnosis not present

## 2022-04-14 DIAGNOSIS — M2550 Pain in unspecified joint: Secondary | ICD-10-CM | POA: Diagnosis not present

## 2022-04-14 DIAGNOSIS — M25562 Pain in left knee: Secondary | ICD-10-CM | POA: Diagnosis not present

## 2022-04-14 DIAGNOSIS — Z79899 Other long term (current) drug therapy: Secondary | ICD-10-CM | POA: Diagnosis not present

## 2022-04-14 DIAGNOSIS — M810 Age-related osteoporosis without current pathological fracture: Secondary | ICD-10-CM | POA: Diagnosis not present

## 2022-04-20 ENCOUNTER — Ambulatory Visit (INDEPENDENT_AMBULATORY_CARE_PROVIDER_SITE_OTHER): Payer: Medicare Other | Admitting: Family Medicine

## 2022-04-20 ENCOUNTER — Encounter: Payer: Self-pay | Admitting: Family Medicine

## 2022-04-20 VITALS — BP 117/76 | HR 93 | Temp 97.3°F | Wt 106.8 lb

## 2022-04-20 DIAGNOSIS — J449 Chronic obstructive pulmonary disease, unspecified: Secondary | ICD-10-CM | POA: Diagnosis not present

## 2022-04-20 DIAGNOSIS — E538 Deficiency of other specified B group vitamins: Secondary | ICD-10-CM

## 2022-04-20 DIAGNOSIS — E782 Mixed hyperlipidemia: Secondary | ICD-10-CM

## 2022-04-20 DIAGNOSIS — M0579 Rheumatoid arthritis with rheumatoid factor of multiple sites without organ or systems involvement: Secondary | ICD-10-CM | POA: Diagnosis not present

## 2022-04-20 DIAGNOSIS — Z23 Encounter for immunization: Secondary | ICD-10-CM

## 2022-04-20 DIAGNOSIS — K219 Gastro-esophageal reflux disease without esophagitis: Secondary | ICD-10-CM

## 2022-04-20 DIAGNOSIS — E559 Vitamin D deficiency, unspecified: Secondary | ICD-10-CM | POA: Diagnosis not present

## 2022-04-20 DIAGNOSIS — E039 Hypothyroidism, unspecified: Secondary | ICD-10-CM

## 2022-04-20 DIAGNOSIS — D649 Anemia, unspecified: Secondary | ICD-10-CM

## 2022-04-20 DIAGNOSIS — I700 Atherosclerosis of aorta: Secondary | ICD-10-CM

## 2022-04-20 DIAGNOSIS — G629 Polyneuropathy, unspecified: Secondary | ICD-10-CM

## 2022-04-20 DIAGNOSIS — F339 Major depressive disorder, recurrent, unspecified: Secondary | ICD-10-CM

## 2022-04-20 NOTE — Progress Notes (Signed)
Assessment & Plan:  1. Acquired hypothyroidism Labs to reassess after dose adjustment. - TSH - T4, free  2. Normocytic anemia Labs to assess. Discussed if lower she may need a transfusion and I will reach out to her hematologist. - Anemia Profile B  3. B12 deficiency Labs to assess. - Anemia Profile B  4. Mixed hyperlipidemia Well controlled on current regimen.  - Lipid panel  5. Aortic atherosclerosis (HCC) Continue current regimen. - Lipid panel  6. COPD without exacerbation ([place]) Well controlled on current regimen.   7. Gastroesophageal reflux disease, unspecified whether esophagitis present Well controlled on current regimen.   8. Neuropathy Well controlled on current regimen.   9. Rheumatoid arthritis involving multiple sites with positive rheumatoid factor ([place]) Managed by rheumatology.  10. Depression, recurrent ([place]) Well controlled on current regimen.   11. Vitamin D deficiency Well controlled on current regimen.   12. Immunization due - Td vaccine greater than or equal to 7yo preservative free IM - Zoster Recombinant (Shingrix )   Return in about 6 months (around 10/21/2022) for annual physical with anybody.  Hendricks Limes, MSN, APRN, FNP-C [facility]  Subjective:    Patient ID: Erica Beasley, female    DOB: [birth year], 66 y.o.   MRN: [id number]  Patient Care Team: Loman Brooklyn, FNP as PCP - General (Family Medicine) Gala Romney Cristopher Estimable, MD as Consulting Physician (Gastroenterology) Liana Gerold, MD as Consulting Physician (Nephrology) Jacquelin Hawking, NP as Nurse Practitioner (Oncology) [facility] as Consulting Physician (Rheumatology)   Chief Complaint:  Chief Complaint  Patient presents with   Medical Management of Chronic Issues    HPI: Erica Beasley is a 66 y.o. female presenting on 04/20/2022 for Medical Management of Chronic Issues  Depression: Doing well with Celexa and  trazodone.     04/20/2022   10:50 AM 08/06/2021    1:45 PM 06/03/2021    4:29 PM  Depression screen PHQ 2/9  Decreased Interest 0 0 0  Down, Depressed, Hopeless 0 0   PHQ - 2 Score 0 0 0  Altered sleeping 0 0 0  Tired, decreased energy 0 0 3  Change in appetite 0 0 2  Feeling bad or failure about yourself  0 0 0  Trouble concentrating 0 0 0  Moving slowly or fidgety/restless 0 0 0  Suicidal thoughts 0 0 0  PHQ-9 Score 0 0 5  Difficult doing work/chores Not difficult at all Not difficult at all Not difficult at all      04/20/2022   10:50 AM 08/06/2021    1:45 PM 06/03/2021    4:30 PM 04/06/2021    9:36 AM  GAD 7 : Generalized Anxiety Score  Nervous, Anxious, on Edge 0 0 0 0  Control/stop worrying 0 0 0 0  Worry too much - different things 0 0 0 0  Trouble relaxing 0 0 0 0  Restless 0 0 0 0  Easily annoyed or irritable 0 0 0 0  Afraid - awful might happen 0 0 0 0  Total GAD 7 Score 0 0 0 0  Anxiety Difficulty Not difficult at all Not difficult at all Not difficult at all     Vitamin D deficiency: Taking a supplement once daily. Last vitamin D level WNL on 06/14/2021.   Vitamin B12 deficiency: Managed by the oncologist. Last vitamin B12 level on 12/01/2021 WNL.  Neuropathy: Controlled with gabapentin.  Rheumatoid arthritis: Patient goes to [facility] rheumatology.  She is currently taking Plaquenil, prednisone, and Arava.    Hypothyroidism: taking levothyroxine 125 mcg daily, which was increased in March.  Hypertension: patient is no longer taking Lisinopril and her blood pressure readings have been good. Patient has been eating healthy.  She walks for exercise.  GERD: Managed by gastroenterologist.  Controlled with Protonix.  Hyperlipidemia: Doing well with pravastatin.  Aortic Atherosclerosis: taking pravastatin. Not taking ASA due to chronic steroid use for her RA.  COPD: patient was prescribed an Albuterol inhaler, but states she has not needed to use it.    Pre-diabetes: Patient reports she has not had any diabetic medication since April 2021.  She has been eating healthy since October 2021.  She walks for exercise.  She does not check her blood sugar at home.  She is on a statin.  CKD: Managed by Dr. Theador Hawthorne, nephrologist.  New complaints: Patient brings lab work completed last week with her rheumatologist which revealed H/H of 7.5/24.4. She reports they did not say anything about it and that her hemoglobin was 8.9 the week prior. She is established with hematology. Denies shortness of breath, chest pain, dizziness, and tachycardia.    Social history:  Relevant past medical, surgical, family and social history reviewed and updated as indicated. Interim medical history since our last visit reviewed.  Allergies and medications reviewed and updated.  DATA REVIEWED: CHART IN EPIC  ROS: Negative unless specifically indicated above in HPI.    Current Outpatient Medications:    Cholecalciferol (VITAMIN D3) 125 MCG (5000 UT) TABS, Take 1 tablet (5,000 Units total) by mouth daily., Disp: 30 tablet, Rfl: 3   citalopram (CELEXA) 40 MG tablet, Take 1 tablet (40 mg total) by mouth daily., Disp: 30 tablet, Rfl: 5   folic acid (FOLVITE) 1 MG tablet, Take 1 tablet (1 mg total) by mouth daily., Disp: 90 tablet, Rfl: 3   gabapentin (NEURONTIN) 300 MG capsule, TAKE (1) CAPSULE THREE TIMES DAILY., Disp: 90 capsule, Rfl: 0   golimumab (SIMPONI ARIA) 50 MG/4ML SOLN injection, Inject into the vein., Disp: , Rfl:    hydroxychloroquine (PLAQUENIL) 200 MG tablet, Take 200 mg by mouth daily. , Disp: , Rfl:    leflunomide (ARAVA) 20 MG tablet, Take 20 mg by mouth daily., Disp: , Rfl:    levothyroxine (SYNTHROID) 125 MCG tablet, TAKE ONE TABLET EVERY MORNING, Disp: 30 tablet, Rfl: 0   loperamide (IMODIUM A-D) 2 MG tablet, Take 1 tablet (2 mg total) by mouth in the morning. Take second dose if needed (Patient taking differently: Take 2 mg by mouth as needed. Take  second dose if needed), Disp: 30 tablet, Rfl: 0   megestrol (MEGACE) 400 MG/10ML suspension, Take 10 mLs (400 mg total) by mouth 2 (two) times daily., Disp: 480 mL, Rfl: 2   pantoprazole (PROTONIX) 40 MG tablet, TAKE ONE TABLET BY MOUTH DAILY BEFORE BREAKFAST, Disp: 90 tablet, Rfl: 0   potassium chloride SA (KLOR-CON M) 20 MEQ tablet, TAKE ONE TABLET ONCE DAILY, NEED DOCTOR APPT., Disp: 30 tablet, Rfl: 6   pravastatin (PRAVACHOL) 40 MG tablet, TAKE ONE TABLET AT BEDTIME, Disp: 30 tablet, Rfl: 0   sulfaDIAZINE 500 MG tablet, Take 500 mg by mouth 2 (two) times daily., Disp: , Rfl:    sulfaSALAzine (AZULFIDINE) 500 MG tablet, 1 tablet, Disp: , Rfl:    traZODone (DESYREL) 100 MG tablet, TAKE 1 TABLET AT BEDTIME AS NEEDED FOR SLEEP, Disp: 30 tablet, Rfl: 0   albuterol (VENTOLIN HFA) 108 (90 Base) MCG/ACT  inhaler, Inhale 2 puffs into the lungs every 6 (six) hours as needed for wheezing or shortness of breath., Disp: , Rfl:    methocarbamol (ROBAXIN) 500 MG tablet, Take 1 tablet (500 mg total) by mouth 2 (two) times daily. (Patient not taking: Reported on 04/20/2022), Disp: 20 tablet, Rfl: 0   No Active Allergies  Past Medical History:  Diagnosis Date   Aortic atherosclerosis ([place]) 11/13/2020   CKD (chronic kidney disease) stage 2, GFR 60-89 ml/min    COPD (chronic obstructive pulmonary disease) ([place])    COVID-19 06/2019   Depression    Essential hypertension    GERD (gastroesophageal reflux disease)    History of diabetes mellitus    Hyperlipidemia    Hypothyroidism    Neuropathy    Rheumatoid arthritis (HCC)    Vitamin B12 deficiency    Vitamin D deficiency     Past Surgical History:  Procedure Laterality Date   BIOPSY  09/16/2019   Procedure: BIOPSY;  Surgeon: Daneil Dolin, MD;  [facility];  Service: Endoscopy;;   BIOPSY  04/28/2020   Procedure: BIOPSY;  Surgeon: Daneil Dolin, MD;  [facility];  Service: Endoscopy;;   BIOPSY  06/08/2020   Procedure: BIOPSY;   Surgeon: Daneil Dolin, MD;  [facility];  Service: Endoscopy;;   CATARACT EXTRACTION Bilateral    COLONOSCOPY  09/2010   Dr. Posey Pronto: mild diverticulsis in sigmoid colon.    COLONOSCOPY WITH PROPOFOL N/A 09/16/2019   Rourk: Diverticulosis, random colon biopsies negative for microscopic colitis.   COLONOSCOPY WITH PROPOFOL N/A 06/08/2020   Procedure: COLONOSCOPY WITH PROPOFOL;  Surgeon: Daneil Dolin, MD;  [facility];  Service: Endoscopy;  Laterality: N/A;  9:15am   ESOPHAGOGASTRODUODENOSCOPY (EGD) WITH PROPOFOL N/A 04/28/2020   Procedure: ESOPHAGOGASTRODUODENOSCOPY (EGD) WITH PROPOFOL;  Surgeon: Daneil Dolin, MD;  [facility];  Service: Endoscopy;  Laterality: N/A;  2:54   YAG LASER APPLICATION Left 98/26/4158   Procedure: YAG LASER APPLICATION;  Surgeon: Williams Che, MD;  Location: [facility];  Service: Ophthalmology;  Laterality: Left;    Social History   Socioeconomic History   Marital status: Widowed    Spouse name: Not on file   Number of children: 1   Years of education: Not on file   Highest education level: Not on file  Occupational History   Occupation: DISABLED  Tobacco Use   Smoking status: Former    Packs/day: 1.00    Years: 15.00    Total pack years: 15.00    Types: Cigarettes    Quit date: 09/11/2017    Years since quitting: 4.6   Smokeless tobacco: Former  Scientific laboratory technician Use: Never used  Substance and Sexual Activity   Alcohol use: Never   Drug use: Never   Sexual activity: Not Currently  Other Topics Concern   Not on file  Social History Narrative   Not on file   Social Determinants of Health   Financial Resource Strain: Low Risk  (09/03/2020)   Overall Financial Resource Strain (CARDIA)    Difficulty of Paying Living Expenses: Not hard at all  Food Insecurity: No Food Insecurity (09/03/2020)   Hunger Vital Sign    Worried About Running Out of Food in the Last Year: Never true    [place] in the Last  Year: Never true  Transportation Needs: No Transportation Needs (09/03/2020)   [place] - Transportation  Lack of Transportation (Medical): No    Lack of Transportation (Non-Medical): No  Physical Activity: Insufficiently Active (09/03/2020)   Exercise Vital Sign    Days of Exercise per Week: 3 days    Minutes of Exercise per Session: 30 min  Stress: No Stress Concern Present (09/03/2020)   [place]    Feeling of Stress : Only a little  Social Connections: Socially Isolated (09/03/2020)   Social Connection and Isolation Panel [NHANES]    Frequency of Communication with Friends and Family: More than three times a week    Frequency of Social Gatherings with Friends and Family: More than three times a week    Attends Religious Services: Never    Marine scientist or Organizations: No    Attends Archivist Meetings: Never    Marital Status: Widowed  Intimate Partner Violence: Not At Risk (09/03/2020)   Humiliation, Afraid, Rape, and Kick questionnaire    Fear of Current or Ex-Partner: No    Emotionally Abused: No    Physically Abused: No    Sexually Abused: No        Objective:    BP 117/76   Pulse 93   Temp (!) 97.3 F (36.3 C)   Wt 106 lb 12.8 oz (48.4 kg)   BMI 18.33 kg/m   Wt Readings from Last 3 Encounters:  04/20/22 106 lb 12.8 oz (48.4 kg)  03/14/22 98 lb 3.2 oz (44.5 kg)  03/04/22 103 lb (46.7 kg)    Physical Exam Vitals reviewed.  Constitutional:      General: She is not in acute distress.    Appearance: Normal appearance. She is underweight. She is not ill-appearing, toxic-appearing or diaphoretic.  HENT:     Head: Normocephalic and atraumatic.  Eyes:     General: No scleral icterus.       Right eye: No discharge.        Left eye: No discharge.     Conjunctiva/sclera: Conjunctivae normal.  Cardiovascular:     Rate and Rhythm: Normal rate and regular rhythm.     Heart sounds:  Normal heart sounds. No murmur heard.    No friction rub. No gallop.  Pulmonary:     Effort: Pulmonary effort is normal. No respiratory distress.     Breath sounds: Normal breath sounds. No stridor. No wheezing, rhonchi or rales.  Musculoskeletal:        General: Normal range of motion.     Cervical back: Normal range of motion.  Skin:    General: Skin is warm and dry.     Capillary Refill: Capillary refill takes less than 2 seconds.  Neurological:     General: No focal deficit present.     Mental Status: She is alert and oriented to person, place, and time. Mental status is at baseline.  Psychiatric:        Mood and Affect: Mood normal.        Behavior: Behavior normal.        Thought Content: Thought content normal.        Judgment: Judgment normal.     Lab Results  Component Value Date   TSH 11.482 (H) 02/23/2022   Lab Results  Component Value Date   WBC 5.6 03/07/2022   HGB 9.2 (L) 03/07/2022   HCT 33.8 (L) 03/07/2022   MCV 91.8 03/07/2022   PLT 359 03/07/2022   Lab Results  Component Value Date  NA 135 02/23/2022   K 3.9 02/23/2022   CO2 27 02/23/2022   GLUCOSE 159 (H) 02/23/2022   BUN 7 (L) 02/23/2022   CREATININE 0.84 02/23/2022   BILITOT 0.3 02/23/2022   ALKPHOS 111 02/23/2022   AST 11 (L) 02/23/2022   ALT 6 02/23/2022   PROT 5.2 (L) 02/23/2022   ALBUMIN 2.8 (L) 02/23/2022   CALCIUM 8.1 (L) 02/23/2022   ANIONGAP 6 02/23/2022   EGFR 74 08/06/2021   Lab Results  Component Value Date   CHOL 201 (H) 11/13/2020   Lab Results  Component Value Date   HDL 82 11/13/2020   Lab Results  Component Value Date   LDLCALC 96 11/13/2020   Lab Results  Component Value Date   TRIG 136 11/13/2020   Lab Results  Component Value Date   CHOLHDL 2.5 11/13/2020   Lab Results  Component Value Date   HGBA1C 4.8 01/28/2022

## 2022-04-21 ENCOUNTER — Other Ambulatory Visit (HOSPITAL_COMMUNITY): Payer: Self-pay | Admitting: *Deleted

## 2022-04-21 ENCOUNTER — Telehealth (HOSPITAL_COMMUNITY): Payer: Self-pay | Admitting: *Deleted

## 2022-04-21 DIAGNOSIS — E538 Deficiency of other specified B group vitamins: Secondary | ICD-10-CM

## 2022-04-21 DIAGNOSIS — D500 Iron deficiency anemia secondary to blood loss (chronic): Secondary | ICD-10-CM

## 2022-04-21 LAB — ANEMIA PROFILE B
Basophils Absolute: 0 10*3/uL (ref 0.0–0.2)
Basos: 0 %
EOS (ABSOLUTE): 0.2 10*3/uL (ref 0.0–0.4)
Eos: 3 %
Ferritin: 679 ng/mL — ABNORMAL HIGH (ref 15–150)
Folate: >20 ng/mL (ref >3.0)
Hematocrit: 25.4 % — ABNORMAL LOW (ref 34.0–46.6)
Hemoglobin: 7.7 g/dL — CL (ref 11.1–15.9)
Immature Grans (Abs): 0 10*3/uL (ref 0.0–0.1)
Immature Granulocytes: 0 %
Iron Saturation: 7 % — CL (ref 15–55)
Iron: 15 ug/dL — ABNORMAL LOW (ref 27–139)
Lymphocytes Absolute: 1.8 10*3/uL (ref 0.7–3.1)
Lymphs: 28 %
MCH: 27 pg (ref 26.6–33.0)
MCHC: 30.3 g/dL — ABNORMAL LOW (ref 31.5–35.7)
MCV: 89 fL (ref 79–97)
Monocytes Absolute: 0.7 10*3/uL (ref 0.1–0.9)
Monocytes: 10 %
Neutrophils Absolute: 3.9 10*3/uL (ref 1.4–7.0)
Neutrophils: 59 %
Platelets: 453 10*3/uL — ABNORMAL HIGH (ref 150–450)
RBC: 2.85 x10E6/uL — ABNORMAL LOW (ref 3.77–5.28)
RDW: 14.5 % (ref 11.7–15.4)
Retic Ct Pct: 3.4 % — ABNORMAL HIGH (ref 0.6–2.6)
Total Iron Binding Capacity: 214 ug/dL — ABNORMAL LOW (ref 250–450)
UIBC: 199 ug/dL (ref 118–369)
Vitamin B-12: 317 pg/mL (ref 232–1245)
WBC: 6.5 10*3/uL (ref 3.4–10.8)

## 2022-04-21 LAB — LIPID PANEL
Chol/HDL Ratio: 3.4 ratio (ref 0.0–4.4)
Cholesterol, Total: 130 mg/dL (ref 100–199)
HDL: 38 mg/dL — ABNORMAL LOW (ref >39)
LDL Chol Calc (NIH): 75 mg/dL (ref 0–99)
Triglycerides: 88 mg/dL (ref 0–149)
VLDL Cholesterol Cal: 17 mg/dL (ref 5–40)

## 2022-04-21 LAB — T4, FREE: Free T4: 1.31 ng/dL (ref 0.82–1.77)

## 2022-04-21 LAB — TSH: TSH: 25.4 u[IU]/mL — ABNORMAL HIGH (ref 0.450–4.500)

## 2022-04-21 NOTE — Telephone Encounter (Signed)
Received lab results from PCP.  Reviewed by Dr. Delton Coombes.  Will put on schedule to see Rebekah next week with additional labs.  Daughter made aware of appointments and that if we had a cancellation for an earlier appointment we would call her to add her on.  Hemaglobin is 7.7, however daughter states that she is not symptomatic.  Advised to take her to the ER if she develops pain, SOB, severe weakness or sees blood in her stool.  Verbalized understanding.  Will order CBCD,BB Sample,LDH, Retic, haptoglobin, folic acid, T95, MMA and stool cards x 3.

## 2022-04-21 NOTE — Progress Notes (Signed)
[place], thank you for making [place] aware.  We will have her follow up with Tarri Abernethy - PA next week with additional labs per Dr. Delton Coombes.

## 2022-04-22 ENCOUNTER — Ambulatory Visit (INDEPENDENT_AMBULATORY_CARE_PROVIDER_SITE_OTHER): Payer: Medicare Other | Admitting: Specialist

## 2022-04-22 ENCOUNTER — Encounter: Payer: Self-pay | Admitting: Specialist

## 2022-04-22 VITALS — BP 142/87 | HR 102 | Ht 64.0 in | Wt 106.8 lb

## 2022-04-22 DIAGNOSIS — M1712 Unilateral primary osteoarthritis, left knee: Secondary | ICD-10-CM | POA: Diagnosis not present

## 2022-04-22 DIAGNOSIS — M1711 Unilateral primary osteoarthritis, right knee: Secondary | ICD-10-CM

## 2022-04-22 DIAGNOSIS — G894 Chronic pain syndrome: Secondary | ICD-10-CM | POA: Diagnosis not present

## 2022-04-22 DIAGNOSIS — M7918 Myalgia, other site: Secondary | ICD-10-CM | POA: Diagnosis not present

## 2022-04-22 DIAGNOSIS — M48062 Spinal stenosis, lumbar region with neurogenic claudication: Secondary | ICD-10-CM

## 2022-04-22 DIAGNOSIS — M4726 Other spondylosis with radiculopathy, lumbar region: Secondary | ICD-10-CM | POA: Diagnosis not present

## 2022-04-22 DIAGNOSIS — M4156 Other secondary scoliosis, lumbar region: Secondary | ICD-10-CM

## 2022-04-22 MED ORDER — METHOCARBAMOL 500 MG PO TABS: 500.0000 mg | ORAL_TABLET | Freq: Two times a day (BID) | ORAL | 0 refills | Status: DC

## 2022-04-22 NOTE — Addendum Note (Signed)
Addended by: Basil Dess on: 04/22/2022 09:43 AM   Modules accepted: Orders

## 2022-04-22 NOTE — Patient Instructions (Signed)
Avoid frequent bending and stooping  No lifting greater than 10 lbs. May use ice or moist heat for pain. Weight loss is of benefit. Best medication for lumbar disc disease is arthritis medications but you can not take those Like motrin and naprosyn due to anemia and medications you are already taking for rheumatologic condition.  Exercise is important to improve your indurance and does allow people to function better inspite of back pain.

## 2022-04-22 NOTE — Progress Notes (Signed)
Office Visit Note   Patient: Erica Beasley           Date of Birth: May 23, 1956           MRN: [id number] Visit Date: 04/22/2022              Requested by: Loman Brooklyn, [place],  [place] [id number] PCP: Loman Brooklyn, FNP   Assessment & Plan: Visit Diagnoses:  1. Chronic pain syndrome   2. Other spondylosis with radiculopathy, lumbar region   3. Other secondary scoliosis, lumbar region   4. Spinal stenosis, lumbar region with neurogenic claudication   5. Pain in right buttock   6. Unilateral primary osteoarthritis, right knee   7. Unilateral primary osteoarthritis, left knee     Plan: Avoid frequent bending and stooping  No lifting greater than 10 lbs. May use ice or moist heat for pain. Weight loss is of benefit. Best medication for lumbar disc disease is arthritis medications but you can not take those Like motrin and naprosyn due to anemia and medications you are already taking for rheumatologic condition.  Exercise is important to improve your indurance and does allow people to function better inspite of back pain.    Follow-Up Instructions: Return in about 6 months (around 10/23/2022) for appointment in 6 months with Dr. Laurance Flatten, new spine specialist..   Orders:  No orders of the defined types were placed in this encounter.  No orders of the defined types were placed in this encounter.     Procedures: No procedures performed   Clinical Data: No additional findings.   Subjective: No chief complaint on file.   66 year old with scoliosis and spondylosis, with intermittant neurogenic claudication. No bowel or bladder difficulty, she has SOB due to COPD and is not a good candidate for surgical long segment fusion. More recently diagnosed with anemia and she may require a blood transfusion reportedly. No new complaints and she feels stable.     Review of Systems  Constitutional: Negative.   HENT: Negative.    Eyes: Negative.    Respiratory: Negative.    Cardiovascular: Negative.   Gastrointestinal: Negative.   Endocrine: Negative.   Genitourinary: Negative.   Musculoskeletal: Negative.   Skin: Negative.   Allergic/Immunologic: Negative.   Neurological: Negative.   Hematological: Negative.   Psychiatric/Behavioral: Negative.       Objective: Vital Signs: BP (!) 142/87 (BP Location: Right Arm, Patient Position: Sitting, Cuff Size: Small)   Pulse (!) 102   Ht '5\' 4"'$  (1.626 m)   Wt 106 lb 12.8 oz (48.4 kg)   BMI 18.33 kg/m   Physical Exam Constitutional:      Appearance: She is well-developed.  HENT:     Head: Normocephalic and atraumatic.  Eyes:     Pupils: Pupils are equal, round, and reactive to light.  Pulmonary:     Effort: Pulmonary effort is normal.     Breath sounds: Normal breath sounds.  Abdominal:     General: Bowel sounds are normal.     Palpations: Abdomen is soft.  Musculoskeletal:     Cervical back: Normal range of motion and neck supple.     Lumbar back: Negative right straight leg raise test and negative left straight leg raise test.  Skin:    General: Skin is warm and dry.  Neurological:     Mental Status: She is alert and oriented to person, place, and time.  Psychiatric:  Behavior: Behavior normal.        Thought Content: Thought content normal.        Judgment: Judgment normal.     Back Exam   Tenderness  The patient is experiencing tenderness in the lumbar.  Range of Motion  Extension:  abnormal  Flexion:  abnormal  Lateral bend right:  abnormal  Lateral bend left:  abnormal  Rotation right:  abnormal  Rotation left:  abnormal   Muscle Strength  Right Quadriceps:  5/5  Left Quadriceps:  5/5  Right Hamstrings:  5/5  Left Hamstrings:  5/5   Tests  Straight leg raise right: negative Straight leg raise left: negative  Reflexes  Patellar:  2/4 Achilles:  2/4  Other  Toe walk: normal Heel walk: normal      Specialty Comments:  No  specialty comments available.  Imaging: No results found.   PMFS History: Patient Active Problem List   Diagnosis Date Noted   Osteoporosis 01/17/2022   Hypothyroidism 11/13/2020   B12 deficiency 11/13/2020   COPD without exacerbation ([place]) 11/13/2020   Vitamin D deficiency 11/13/2020   Neuropathy 11/13/2020   Rheumatoid arthritis involving multiple sites ([place]) 11/13/2020   Mixed hyperlipidemia 11/13/2020   Aortic atherosclerosis ([place]) 11/13/2020   GERD (gastroesophageal reflux disease) 11/05/2020   Depression, recurrent ([place]) 09/22/2020   Dehydration 08/13/2020   Hypokalemia 08/13/2020   Abnormal CT of the abdomen 06/02/2020   N&V (nausea and vomiting) 04/08/2020   Monoclonal gammopathy 04/03/2020   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 03/26/2020   Hx of colonic polyps 05/03/2019   H/O Clostridium difficile infection 05/03/2019   Normocytic anemia 05/03/2019   Chronic diarrhea 01/29/2019   Past Medical History:  Diagnosis Date   Aortic atherosclerosis ([place]) 11/13/2020   CKD (chronic kidney disease) stage 2, GFR 60-89 ml/min    COPD (chronic obstructive pulmonary disease) ([place])    COVID-19 06/2019   Depression    Essential hypertension    GERD (gastroesophageal reflux disease)    History of diabetes mellitus    Hyperlipidemia    Hypothyroidism    Neuropathy    Rheumatoid arthritis (HCC)    Vitamin B12 deficiency    Vitamin D deficiency     Family History  Problem Relation Age of Onset   Stroke Mother    Heart disease Mother    Diabetes Mother    Breast cancer Mother    Other Father        stomach taken out for some reason   Migraines Daughter    Colon cancer Neg Hx     Past Surgical History:  Procedure Laterality Date   BIOPSY  09/16/2019   Procedure: BIOPSY;  Surgeon: Daneil Dolin, MD;  [facility];  Service: Endoscopy;;   BIOPSY  04/28/2020   Procedure: BIOPSY;  Surgeon: Daneil Dolin, MD;  [facility];   Service: Endoscopy;;   BIOPSY  06/08/2020   Procedure: BIOPSY;  Surgeon: Daneil Dolin, MD;  [facility];  Service: Endoscopy;;   CATARACT EXTRACTION Bilateral    COLONOSCOPY  09/2010   Dr. Posey Pronto: mild diverticulsis in sigmoid colon.    COLONOSCOPY WITH PROPOFOL N/A 09/16/2019   Rourk: Diverticulosis, random colon biopsies negative for microscopic colitis.   COLONOSCOPY WITH PROPOFOL N/A 06/08/2020   Procedure: COLONOSCOPY WITH PROPOFOL;  Surgeon: Daneil Dolin, MD;  [facility];  Service: Endoscopy;  Laterality: N/A;  9:15am   ESOPHAGOGASTRODUODENOSCOPY (EGD) WITH PROPOFOL N/A  04/28/2020   Procedure: ESOPHAGOGASTRODUODENOSCOPY (EGD) WITH PROPOFOL;  Surgeon: Daneil Dolin, MD;  [facility];  Service: Endoscopy;  Laterality: N/A;  1:10   YAG LASER APPLICATION Left 31/59/4585   Procedure: YAG LASER APPLICATION;  Surgeon: Williams Che, MD;  [facility];  Service: Ophthalmology;  Laterality: Left;   Social History   Occupational History   Occupation: DISABLED  Tobacco Use   Smoking status: Former    Packs/day: 1.00    Years: 15.00    Total pack years: 15.00    Types: Cigarettes    Quit date: 09/11/2017    Years since quitting: 4.6   Smokeless tobacco: Former  Scientific laboratory technician Use: Never used  Substance and Sexual Activity   Alcohol use: Never   Drug use: Never   Sexual activity: Not Currently

## 2022-04-27 NOTE — Progress Notes (Signed)
Erica Beasley, [place] [id number]   CLINIC:  Medical Oncology/Hematology  PCP:  Loman [place], [place] [address] [phone]   INTERVAL HISTORY:  Erica Beasley 66 y.o. female was added on for visit today due to acute worsening of her normocytic anemia.  She follows with Dr. Delton Coombes for normocytic anemia and Bence-Jones proteinuria, last seen by Dr. Delton Coombes on 03/14/2022.  Labs checked by PCP on 04/20/2022 showed acute worsening of her anemia with Hgb 7.7 (down from 9.2 on 03/07/2022.  Patient denies any bright red blood per rectum, melena, epistaxis, or other signs of blood loss.  She admits to fatigue with energy about 40%.  She has dyspnea on exertion which she reports is chronic and stable.  She denies any pica, restless legs, headaches, lightheadedness, syncope, or chest pain.  No B symptoms such as fever, chills, night sweats, unintentional weight loss.  She reports improved appetite and increased weight after starting Megace.   REVIEW OF SYSTEMS:    Review of Systems  Constitutional:  Positive for fatigue. Negative for appetite change, chills, diaphoresis, fever and unexpected weight change.  HENT:   Negative for lump/mass and nosebleeds.   Eyes:  Negative for eye problems.  Respiratory:  Positive for shortness of breath. Negative for cough and hemoptysis.   Cardiovascular:  Negative for chest pain, leg swelling and palpitations.  Gastrointestinal:  Negative for abdominal pain, blood in stool, constipation, diarrhea, nausea and vomiting.  Genitourinary:  Negative for hematuria.   Musculoskeletal:  Positive for arthralgias and back pain.  Skin: Negative.   Neurological:  Negative for dizziness, headaches and light-headedness.  Hematological:  Does not bruise/bleed easily.      PAST MEDICAL/SURGICAL HISTORY:  Past Medical History:  Diagnosis Date   Aortic atherosclerosis ([place] [facility]) 11/13/2020   CKD (chronic kidney  disease) stage 2, GFR 60-89 ml/min    COPD (chronic obstructive pulmonary disease) ([place])    COVID-19 06/2019   Depression    Essential hypertension    GERD (gastroesophageal reflux disease)    History of diabetes mellitus    Hyperlipidemia    Hypothyroidism    Neuropathy    Rheumatoid arthritis ([facility])    Vitamin B12 deficiency    Vitamin D deficiency    Past Surgical History:  Procedure Laterality Date   BIOPSY  09/16/2019   Procedure: BIOPSY;  Surgeon: Daneil Dolin, MD;  [facility];  Service: Endoscopy;;   BIOPSY  04/28/2020   Procedure: BIOPSY;  Surgeon: Daneil Dolin, MD;  [facility];  Service: Endoscopy;;   BIOPSY  06/08/2020   Procedure: BIOPSY;  Surgeon: Daneil Dolin, MD;  [facility];  Service: Endoscopy;;   CATARACT EXTRACTION Bilateral    COLONOSCOPY  09/2010   Dr. Posey Pronto: mild diverticulsis in sigmoid colon.    COLONOSCOPY WITH PROPOFOL N/A 09/16/2019   Rourk: Diverticulosis, random colon biopsies negative for microscopic colitis.   COLONOSCOPY WITH PROPOFOL N/A 06/08/2020   Procedure: COLONOSCOPY WITH PROPOFOL;  Surgeon: Daneil Dolin, MD;  [facility];  Service: Endoscopy;  Laterality: N/A;  9:15am   ESOPHAGOGASTRODUODENOSCOPY (EGD) WITH PROPOFOL N/A 04/28/2020   Procedure: ESOPHAGOGASTRODUODENOSCOPY (EGD) WITH PROPOFOL;  Surgeon: Daneil Dolin, MD;  [facility];  Service: Endoscopy;  Laterality: N/A;  4:12   YAG LASER APPLICATION Left 87/86/7672   Procedure: YAG LASER APPLICATION;  Surgeon: Williams Che, MD;  Location: [facility];  Service: Ophthalmology;  Laterality: Left;     SOCIAL HISTORY:  Social History   Socioeconomic History   Marital status: Widowed    Spouse name: Not on file   Number of children: 1   Years of education: Not on file   Highest education level: Not on file  Occupational History   Occupation: DISABLED  Tobacco Use   Smoking status: Former    Packs/day: 1.00     Years: 15.00    Total pack years: 15.00    Types: Cigarettes    Quit date: 09/11/2017    Years since quitting: 4.6   Smokeless tobacco: Former  Scientific laboratory technician Use: Never used  Substance and Sexual Activity   Alcohol use: Never   Drug use: Never   Sexual activity: Not Currently  Other Topics Concern   Not on file  Social History Narrative   Not on file   Social Determinants of Health   Financial Resource Strain: Low Risk  (09/03/2020)   Overall Financial Resource Strain (CARDIA)    Difficulty of Paying Living Expenses: Not hard at all  Food Insecurity: No Food Insecurity (09/03/2020)   Hunger Vital Sign    Worried About Running Out of Food in the Last Year: Never true    [place] in the Last Year: Never true  Transportation Needs: No Transportation Needs (09/03/2020)   PRAPARE - Hydrologist (Medical): No    Lack of Transportation (Non-Medical): No  Physical Activity: Insufficiently Active (09/03/2020)   Exercise Vital Sign    Days of Exercise per Week: 3 days    Minutes of Exercise per Session: 30 min  Stress: No Stress Concern Present (09/03/2020)   [place]    Feeling of Stress : Only a little  Social Connections: Socially Isolated (09/03/2020)   Social Connection and Isolation Panel [NHANES]    Frequency of Communication with Friends and Family: More than three times a week    Frequency of Social Gatherings with Friends and Family: More than three times a week    Attends Religious Services: Never    Marine scientist or Organizations: No    Attends Archivist Meetings: Never    Marital Status: Widowed  Intimate Partner Violence: Not At Risk (09/03/2020)   Humiliation, Afraid, Rape, and Kick questionnaire    Fear of Current or Ex-Partner: No    Emotionally Abused: No    Physically Abused: No    Sexually Abused: No    FAMILY HISTORY:  Family  History  Problem Relation Age of Onset   Stroke Mother    Heart disease Mother    Diabetes Mother    Breast cancer Mother    Other Father        stomach taken out for some reason   Migraines Daughter    Colon cancer Neg Hx     CURRENT MEDICATIONS:  Outpatient Encounter Medications as of 04/28/2022  Medication Sig   albuterol (VENTOLIN HFA) 108 (90 Base) MCG/ACT inhaler Inhale 2 puffs into the lungs every 6 (six) hours as needed for wheezing or shortness of breath.   Cholecalciferol (VITAMIN D3) 125 MCG (5000 UT) TABS Take 1 tablet (5,000 Units total) by mouth daily.   citalopram (CELEXA) 40 MG tablet Take 1 tablet (40 mg total) by mouth daily.   folic acid (FOLVITE) 1 MG tablet Take 1 tablet (1 mg total) by mouth  daily.   gabapentin (NEURONTIN) 300 MG capsule TAKE (1) CAPSULE THREE TIMES DAILY.   golimumab (SIMPONI ARIA) 50 MG/4ML SOLN injection Inject into the vein.   hydroxychloroquine (PLAQUENIL) 200 MG tablet Take 200 mg by mouth daily.    leflunomide (ARAVA) 20 MG tablet Take 20 mg by mouth daily.   levothyroxine (SYNTHROID) 125 MCG tablet TAKE ONE TABLET EVERY MORNING   loperamide (IMODIUM A-D) 2 MG tablet Take 1 tablet (2 mg total) by mouth in the morning. Take second dose if needed (Patient taking differently: Take 2 mg by mouth as needed. Take second dose if needed)   megestrol (MEGACE) 400 MG/10ML suspension Take 10 mLs (400 mg total) by mouth 2 (two) times daily.   methocarbamol (ROBAXIN) 500 MG tablet Take 1 tablet (500 mg total) by mouth 2 (two) times daily.   pantoprazole (PROTONIX) 40 MG tablet TAKE ONE TABLET BY MOUTH DAILY BEFORE BREAKFAST   potassium chloride SA (KLOR-CON M) 20 MEQ tablet TAKE ONE TABLET ONCE DAILY, NEED DOCTOR APPT.   pravastatin (PRAVACHOL) 40 MG tablet TAKE ONE TABLET AT BEDTIME   sulfaDIAZINE 500 MG tablet Take 500 mg by mouth 2 (two) times daily.   sulfaSALAzine (AZULFIDINE) 500 MG tablet 1 tablet   traZODone (DESYREL) 100 MG tablet TAKE 1 TABLET  AT BEDTIME AS NEEDED FOR SLEEP   No facility-administered encounter medications on file as of 04/28/2022.    ALLERGIES:  No Active Allergies   PHYSICAL EXAM:    ECOG PERFORMANCE STATUS: 1 - Symptomatic but completely ambulatory  There were no vitals filed for this visit. There were no vitals filed for this visit. Physical Exam Vitals reviewed.  Constitutional:      Appearance: Normal appearance.  Cardiovascular:     Rate and Rhythm: Normal rate and regular rhythm.     Pulses: Normal pulses.     Heart sounds: Normal heart sounds.  Pulmonary:     Effort: Pulmonary effort is normal.     Breath sounds: Normal breath sounds.  Neurological:     General: No focal deficit present.     Mental Status: She is alert and oriented to person, place, and time.  Psychiatric:        Mood and Affect: Mood normal.        Behavior: Behavior normal.      LABORATORY DATA:  I have reviewed the labs as listed.  CBC    Component Value Date/Time   WBC 6.5 04/20/2022 1103   WBC 5.6 03/07/2022 1049   RBC 2.85 (L) 04/20/2022 1103   RBC 3.68 (L) 03/07/2022 1049   HGB 7.7 (LL) 04/20/2022 1103   HCT 25.4 (L) 04/20/2022 1103   PLT 453 (H) 04/20/2022 1103   MCV 89 04/20/2022 1103   MCH 27.0 04/20/2022 1103   MCH 25.0 (L) 03/07/2022 1049   MCHC 30.3 (L) 04/20/2022 1103   MCHC 27.2 (L) 03/07/2022 1049   RDW 14.5 04/20/2022 1103   LYMPHSABS 1.8 04/20/2022 1103   MONOABS 0.6 03/07/2022 1049   EOSABS 0.2 04/20/2022 1103   BASOSABS 0.0 04/20/2022 1103      Latest Ref Rng & Units 02/23/2022   12:25 PM 01/10/2022   12:08 PM 12/01/2021   10:46 AM  CMP  Glucose 70 - 99 mg/dL 159  199  203   BUN 8 - 23 mg/dL '7  6  8   '$ Creatinine 0.44 - 1.00 mg/dL 0.84  0.67  0.73   Sodium 135 - 145 mmol/L 135  137  132   Potassium 3.5 - 5.1 mmol/L 3.9  3.6  4.0   Chloride 98 - 111 mmol/L 102  102  99   CO2 22 - 32 mmol/L '27  26  24   '$ Calcium 8.9 - 10.3 mg/dL 8.1  8.7  9.0   Total Protein 6.5 - 8.1 g/dL 5.2  5.8   6.1   Total Bilirubin 0.3 - 1.2 mg/dL 0.3  0.3  0.1   Alkaline Phos 38 - 126 U/L 111  114  100   AST 15 - 41 U/L '11  11  11   '$ ALT 0 - 44 U/L '6  6  6     '$ DIAGNOSTIC IMAGING:  I have independently reviewed the relevant imaging and discussed with the patient.  ASSESSMENT & PLAN: 1.  Acute on chronic normocytic anemia - CBC on 03/20/2020 shows hemoglobin 9.9 with MCV of 93.  White count and platelets were normal. - Colonoscopy on 09/16/2019 shows diverticulosis in the entire examined colon.  Nonbleeding internal hemorrhoids. -  BMBX on 07/19/2021: Mildly hypercellular marrow with trilineage hematopoiesis.  No increased ring sideroblasts or blasts.  3% polytypic plasma cells.  No large aggregates of plasma cells.  Iron stores increased.  Chromosome analysis normal. - Stool for occult blood was negative x3 in May 2023 - She denies any history of blood transfusion.  She does not take aspirin or any blood thinners. - Labs checked by PCP on 04/20/2022 showed acute worsening of her anemia with Hgb 7.7 (down from 9.2 on 03/07/2022). - Iron panel by PCP (04/20/2022) showed elevated ferritin 679 (acute phase reactant in the setting of RA?),  With low iron saturation 7% and low serum iron 15, low TIBC 214 - Labs today (04/28/2022): Hgb 7.4/MCV 95.6, platelets 429, normal differential. Elevated reticulocytes at 3.5%.  Normal haptoglobin, LDH, bilirubin. Borderline vitamin B12 at 221 with methylmalonic acid pending.  Normal folate. - PLAN:  Elevated reticulocytes suspicious for acute blood loss (hemolysis labs otherwise negative).  We will check stool cards x3.   - We will repeat CBC with BB sample next week and schedule for possible blood transfusion.  (Weekly CBC/BB sample x2 weeks) - Patient educated on alarm symptoms that would prompt immediate medical attention/ED visit.  - Office visit in 2 weeks with repeat iron panel.  2.  Other issues:  Patient also follows with clinic for the following issues, which  will be addressed in more detail at follow-up visit.  (Addressed by Dr. Delton Coombes on 03/14/2022) Bence-Jones proteinuria (due for repeat labs October 2023) Weight loss: Improved appetite and improved weight after starting Megace. Rheumatoid arthritis Hypokalemia Depression Osteoporosis: Next Zometa due around 02/23/2023   All questions were answered. The patient knows to call the clinic with any problems, questions or concerns.  Medical decision making: Moderate  Time spent on visit: I spent 20 minutes counseling the patient face to face. The total time spent in the appointment was 30 minutes and more than 50% was on counseling.   Harriett Rush, PA-C  04/29/2022 11:04 PM

## 2022-04-28 ENCOUNTER — Inpatient Hospital Stay: Payer: Medicare Other | Attending: Physician Assistant | Admitting: Physician Assistant

## 2022-04-28 ENCOUNTER — Other Ambulatory Visit (HOSPITAL_COMMUNITY): Payer: Self-pay | Admitting: Hematology

## 2022-04-28 ENCOUNTER — Other Ambulatory Visit: Payer: Self-pay | Admitting: Family Medicine

## 2022-04-28 ENCOUNTER — Inpatient Hospital Stay: Payer: Medicare Other

## 2022-04-28 VITALS — BP 133/85 | HR 89 | Temp 98.4°F | Resp 18 | Wt 109.7 lb

## 2022-04-28 DIAGNOSIS — Z87891 Personal history of nicotine dependence: Secondary | ICD-10-CM | POA: Diagnosis not present

## 2022-04-28 DIAGNOSIS — D649 Anemia, unspecified: Secondary | ICD-10-CM | POA: Insufficient documentation

## 2022-04-28 DIAGNOSIS — R634 Abnormal weight loss: Secondary | ICD-10-CM | POA: Insufficient documentation

## 2022-04-28 DIAGNOSIS — M810 Age-related osteoporosis without current pathological fracture: Secondary | ICD-10-CM | POA: Diagnosis not present

## 2022-04-28 DIAGNOSIS — R803 Bence Jones proteinuria: Secondary | ICD-10-CM | POA: Insufficient documentation

## 2022-04-28 DIAGNOSIS — E876 Hypokalemia: Secondary | ICD-10-CM | POA: Insufficient documentation

## 2022-04-28 DIAGNOSIS — Z8616 Personal history of COVID-19: Secondary | ICD-10-CM | POA: Insufficient documentation

## 2022-04-28 DIAGNOSIS — D500 Iron deficiency anemia secondary to blood loss (chronic): Secondary | ICD-10-CM | POA: Diagnosis not present

## 2022-04-28 DIAGNOSIS — Z79899 Other long term (current) drug therapy: Secondary | ICD-10-CM | POA: Insufficient documentation

## 2022-04-28 DIAGNOSIS — F339 Major depressive disorder, recurrent, unspecified: Secondary | ICD-10-CM

## 2022-04-28 DIAGNOSIS — F32A Depression, unspecified: Secondary | ICD-10-CM | POA: Diagnosis not present

## 2022-04-28 DIAGNOSIS — Z803 Family history of malignant neoplasm of breast: Secondary | ICD-10-CM | POA: Diagnosis not present

## 2022-04-28 DIAGNOSIS — E039 Hypothyroidism, unspecified: Secondary | ICD-10-CM

## 2022-04-28 DIAGNOSIS — M069 Rheumatoid arthritis, unspecified: Secondary | ICD-10-CM | POA: Insufficient documentation

## 2022-04-28 DIAGNOSIS — E538 Deficiency of other specified B group vitamins: Secondary | ICD-10-CM

## 2022-04-28 DIAGNOSIS — G629 Polyneuropathy, unspecified: Secondary | ICD-10-CM

## 2022-04-28 LAB — COMPREHENSIVE METABOLIC PANEL
ALT: 7 U/L (ref 0–44)
AST: 10 U/L — ABNORMAL LOW (ref 15–41)
Albumin: 3 g/dL — ABNORMAL LOW (ref 3.5–5.0)
Alkaline Phosphatase: 68 U/L (ref 38–126)
Anion gap: 7 (ref 5–15)
BUN: 9 mg/dL (ref 8–23)
CO2: 21 mmol/L — ABNORMAL LOW (ref 22–32)
Calcium: 8.2 mg/dL — ABNORMAL LOW (ref 8.9–10.3)
Chloride: 108 mmol/L (ref 98–111)
Creatinine, Ser: 0.85 mg/dL (ref 0.44–1.00)
GFR, Estimated: >60 mL/min (ref >60)
Glucose, Bld: 183 mg/dL — ABNORMAL HIGH (ref 70–99)
Potassium: 3.8 mmol/L (ref 3.5–5.1)
Sodium: 136 mmol/L (ref 135–145)
Total Bilirubin: 0.4 mg/dL (ref 0.3–1.2)
Total Protein: 6.2 g/dL — ABNORMAL LOW (ref 6.5–8.1)

## 2022-04-28 LAB — CBC WITH DIFFERENTIAL/PLATELET
Abs Immature Granulocytes: 0.05 10*3/uL (ref 0.00–0.07)
Basophils Absolute: 0 10*3/uL (ref 0.0–0.1)
Basophils Relative: 0 %
Eosinophils Absolute: 0.2 10*3/uL (ref 0.0–0.5)
Eosinophils Relative: 3 %
HCT: 26.1 % — ABNORMAL LOW (ref 36.0–46.0)
Hemoglobin: 7.4 g/dL — ABNORMAL LOW (ref 12.0–15.0)
Immature Granulocytes: 1 %
Lymphocytes Relative: 28 %
Lymphs Abs: 2.1 10*3/uL (ref 0.7–4.0)
MCH: 27.1 pg (ref 26.0–34.0)
MCHC: 28.4 g/dL — ABNORMAL LOW (ref 30.0–36.0)
MCV: 95.6 fL (ref 80.0–100.0)
Monocytes Absolute: 0.8 10*3/uL (ref 0.1–1.0)
Monocytes Relative: 10 %
Neutro Abs: 4.3 10*3/uL (ref 1.7–7.7)
Neutrophils Relative %: 58 %
Platelets: 429 10*3/uL — ABNORMAL HIGH (ref 150–400)
RBC: 2.73 MIL/uL — ABNORMAL LOW (ref 3.87–5.11)
RDW: 16.6 % — ABNORMAL HIGH (ref 11.5–15.5)
WBC: 7.4 10*3/uL (ref 4.0–10.5)
nRBC: 0 % (ref 0.0–0.2)

## 2022-04-28 LAB — FOLATE: Folate: 28.3 ng/mL (ref >5.9)

## 2022-04-28 LAB — SAMPLE TO BLOOD BANK

## 2022-04-28 LAB — LACTATE DEHYDROGENASE: LDH: 162 U/L (ref 98–192)

## 2022-04-28 LAB — RETICULOCYTES
Immature Retic Fract: 34.8 % — ABNORMAL HIGH (ref 2.3–15.9)
RBC.: 2.72 MIL/uL — ABNORMAL LOW (ref 3.87–5.11)
Retic Count, Absolute: 94.1 10*3/uL (ref 19.0–186.0)
Retic Ct Pct: 3.5 % — ABNORMAL HIGH (ref 0.4–3.1)

## 2022-04-28 LAB — VITAMIN B12: Vitamin B-12: 221 pg/mL (ref 180–914)

## 2022-04-28 NOTE — Patient Instructions (Signed)
[facility] [place] **   You were seen today by Tarri Abernethy PA-C for your anemia (low red blood cells).    ANEMIA: Your blood cells are always on the low side, but they have become much lower over the past month. We do not yet know the cause of why your blood cells are low.  We checked labs today, but do not have all the results back yet. We will check STOOL CARDS x3.  Once these are complete, please bring them back to the front desk on the fourth floor [facility]) We will need to check your blood once a week for the next 2 weeks to make sure that it has not dropped so low that you need a blood transfusion. We will see you for follow-up visit in 2 weeks. If you notice any obvious bleeding (bright red blood in the toilet, nosebleeds, black/tarry bowel movements), then you should [place]. If you have increasing weakness, difficulty breathing, chest pain, lightheadedness, or passing out, then you should [place].  FOLLOW-UP APPOINTMENT: Office visit in 2 weeks.  ** Thank you for trusting me with your healthcare!  I strive to provide all of my patients with quality care at each visit.  If you receive a survey for this visit, I would be so grateful to you for taking the time to provide feedback.  Thank you in advance!  ~ Somtochukwu Woollard                   Dr. Derek Jack   &   Tarri Abernethy, PA-C   - - - - - - - - - - - - - - - - - -    Thank you for choosing [facility] at [facility] to provide your oncology and hematology care.  To afford each patient quality time with our provider, please arrive at least 15 minutes before your scheduled appointment time.   If you have a lab appointment with the [facility] please come in thru the Main Entrance and check in at the main information desk.  You need to re-schedule your appointment should  you arrive 10 or more minutes late.  We strive to give you quality time with our providers, and arriving late affects you and other patients whose appointments are after yours.  Also, if you no show three or more times for appointments you may be dismissed from the clinic at the providers discretion.     Again, thank you for choosing [facility].  Our hope is that these requests will decrease the amount of time that you wait before being seen by our physicians.       _____________________________________________________________  Should you have questions after your visit to [facility], please contact our office at [phone] and follow the prompts.  Our office hours are 8:00 a.m. and 4:30 p.m. Monday - Friday.  Please note that voicemails left after 4:00 p.m. may not be returned until the following business day.  We are closed weekends and major holidays.  You do have access to a nurse 24-7, just call the main number to the clinic [phone] and do not press any options, hold on the line and a nurse will answer the phone.    For prescription refill requests, have your pharmacy contact our office and allow 72 hours.

## 2022-04-29 ENCOUNTER — Encounter (HOSPITAL_COMMUNITY): Payer: Self-pay | Admitting: Hematology

## 2022-04-29 LAB — HAPTOGLOBIN: Haptoglobin: 407 mg/dL — ABNORMAL HIGH (ref 37–355)

## 2022-05-01 LAB — METHYLMALONIC ACID, SERUM: Methylmalonic Acid, Quantitative: 99 nmol/L (ref 0–378)

## 2022-05-02 ENCOUNTER — Other Ambulatory Visit: Payer: Self-pay

## 2022-05-02 DIAGNOSIS — D500 Iron deficiency anemia secondary to blood loss (chronic): Secondary | ICD-10-CM

## 2022-05-02 DIAGNOSIS — E538 Deficiency of other specified B group vitamins: Secondary | ICD-10-CM

## 2022-05-02 DIAGNOSIS — R634 Abnormal weight loss: Secondary | ICD-10-CM | POA: Diagnosis not present

## 2022-05-02 DIAGNOSIS — E876 Hypokalemia: Secondary | ICD-10-CM | POA: Diagnosis not present

## 2022-05-02 DIAGNOSIS — Z87891 Personal history of nicotine dependence: Secondary | ICD-10-CM | POA: Diagnosis not present

## 2022-05-02 DIAGNOSIS — F32A Depression, unspecified: Secondary | ICD-10-CM | POA: Diagnosis not present

## 2022-05-02 DIAGNOSIS — M810 Age-related osteoporosis without current pathological fracture: Secondary | ICD-10-CM | POA: Diagnosis not present

## 2022-05-02 DIAGNOSIS — R803 Bence Jones proteinuria: Secondary | ICD-10-CM | POA: Diagnosis not present

## 2022-05-02 DIAGNOSIS — Z8616 Personal history of COVID-19: Secondary | ICD-10-CM | POA: Diagnosis not present

## 2022-05-02 DIAGNOSIS — M069 Rheumatoid arthritis, unspecified: Secondary | ICD-10-CM | POA: Diagnosis not present

## 2022-05-02 DIAGNOSIS — Z803 Family history of malignant neoplasm of breast: Secondary | ICD-10-CM | POA: Diagnosis not present

## 2022-05-02 DIAGNOSIS — D649 Anemia, unspecified: Secondary | ICD-10-CM | POA: Diagnosis not present

## 2022-05-02 DIAGNOSIS — Z79899 Other long term (current) drug therapy: Secondary | ICD-10-CM | POA: Diagnosis not present

## 2022-05-02 LAB — OCCULT BLOOD X 1 CARD TO LAB, STOOL
Fecal Occult Bld: NEGATIVE
Fecal Occult Bld: NEGATIVE
Fecal Occult Bld: NEGATIVE

## 2022-05-04 ENCOUNTER — Inpatient Hospital Stay: Payer: Medicare Other

## 2022-05-04 DIAGNOSIS — D649 Anemia, unspecified: Secondary | ICD-10-CM | POA: Diagnosis not present

## 2022-05-04 DIAGNOSIS — M069 Rheumatoid arthritis, unspecified: Secondary | ICD-10-CM | POA: Diagnosis not present

## 2022-05-04 DIAGNOSIS — D500 Iron deficiency anemia secondary to blood loss (chronic): Secondary | ICD-10-CM

## 2022-05-04 DIAGNOSIS — F32A Depression, unspecified: Secondary | ICD-10-CM | POA: Diagnosis not present

## 2022-05-04 DIAGNOSIS — R634 Abnormal weight loss: Secondary | ICD-10-CM | POA: Diagnosis not present

## 2022-05-04 DIAGNOSIS — E876 Hypokalemia: Secondary | ICD-10-CM | POA: Diagnosis not present

## 2022-05-04 DIAGNOSIS — Z803 Family history of malignant neoplasm of breast: Secondary | ICD-10-CM | POA: Diagnosis not present

## 2022-05-04 DIAGNOSIS — Z79899 Other long term (current) drug therapy: Secondary | ICD-10-CM | POA: Diagnosis not present

## 2022-05-04 DIAGNOSIS — R803 Bence Jones proteinuria: Secondary | ICD-10-CM | POA: Diagnosis not present

## 2022-05-04 DIAGNOSIS — Z87891 Personal history of nicotine dependence: Secondary | ICD-10-CM | POA: Diagnosis not present

## 2022-05-04 DIAGNOSIS — Z8616 Personal history of COVID-19: Secondary | ICD-10-CM | POA: Diagnosis not present

## 2022-05-04 DIAGNOSIS — M810 Age-related osteoporosis without current pathological fracture: Secondary | ICD-10-CM | POA: Diagnosis not present

## 2022-05-04 LAB — CBC
HCT: 25.3 % — ABNORMAL LOW (ref 36.0–46.0)
Hemoglobin: 7.2 g/dL — ABNORMAL LOW (ref 12.0–15.0)
MCH: 27.4 pg (ref 26.0–34.0)
MCHC: 28.5 g/dL — ABNORMAL LOW (ref 30.0–36.0)
MCV: 96.2 fL (ref 80.0–100.0)
Platelets: 372 10*3/uL (ref 150–400)
RBC: 2.63 MIL/uL — ABNORMAL LOW (ref 3.87–5.11)
RDW: 17 % — ABNORMAL HIGH (ref 11.5–15.5)
WBC: 8.4 10*3/uL (ref 4.0–10.5)
nRBC: 0 % (ref 0.0–0.2)

## 2022-05-04 LAB — SAMPLE TO BLOOD BANK

## 2022-05-04 NOTE — Progress Notes (Signed)
Hemoglobin 7.2 today. No blood needed per Reb. Pennington PA-C. Patient stated she felt good today.   Follow up as scheduled. discharged home from clinic ambulatory.

## 2022-05-11 ENCOUNTER — Telehealth: Payer: Self-pay | Admitting: Specialist

## 2022-05-11 ENCOUNTER — Other Ambulatory Visit: Payer: Self-pay | Admitting: Physician Assistant

## 2022-05-11 DIAGNOSIS — D649 Anemia, unspecified: Secondary | ICD-10-CM

## 2022-05-11 NOTE — Telephone Encounter (Signed)
[facility] called and states pt is out of network and we need to find smeone else

## 2022-05-11 NOTE — Progress Notes (Unsigned)
Erica Beasley, Erica Beasley [id number]   CLINIC:  Medical Oncology/Hematology  PCP:  Loman [place], [place] [address] [phone]   INTERVAL HISTORY:  [name] 66 y.o. female returns today to discuss work-up of her acute worsening of her normocytic anemia.  She was last seen by Tarri Abernethy PA-C on 04/28/2022. Patient denies any changes in her health since her visit 2 weeks ago.  Continues to deny any bright red blood per rectum, melena, epistaxis, or other signs of blood loss. She admits to fatigue with energy about  70%.  She has dyspnea on exertion which she reports is chronic and stable.  She denies any pica, restless legs, headaches, lightheadedness, syncope, or chest pain.  No B symptoms such as fever, chills, night sweats, unintentional weight loss.  She reports improved appetite and increased weight after starting Megace.   REVIEW OF SYSTEMS:      Review of Systems  Constitutional:  Positive for fatigue. Negative for appetite change, chills, diaphoresis, fever and unexpected weight change.  HENT:   Negative for lump/mass and nosebleeds.   Eyes:  Negative for eye problems.  Respiratory:  Positive for shortness of breath. Negative for cough and hemoptysis.   Cardiovascular:  Negative for chest pain, leg swelling and palpitations.  Gastrointestinal:  Negative for abdominal pain, blood in stool, constipation, diarrhea, nausea and vomiting.  Genitourinary:  Negative for hematuria.   Musculoskeletal:  Positive for arthralgias and back pain.  Skin: Negative.   Neurological:  Negative for dizziness, headaches and light-headedness.  Hematological:  Does not bruise/bleed easily.      PAST MEDICAL/SURGICAL HISTORY:  Past Medical History:  Diagnosis Date   Aortic atherosclerosis ([facility]) 11/13/2020   CKD (chronic kidney disease) stage 2, GFR 60-89 ml/min    COPD (chronic obstructive pulmonary disease) ([facility])    COVID-19  06/2019   Depression    Essential hypertension    GERD (gastroesophageal reflux disease)    History of diabetes mellitus    Hyperlipidemia    Hypothyroidism    Neuropathy    Rheumatoid arthritis (HCC)    Vitamin B12 deficiency    Vitamin D deficiency    Past Surgical History:  Procedure Laterality Date   BIOPSY  09/16/2019   Procedure: BIOPSY;  Surgeon: Daneil Dolin, MD;  [facility];  Service: Endoscopy;;   BIOPSY  04/28/2020   Procedure: BIOPSY;  Surgeon: Daneil Dolin, MD;  [facility];  Service: Endoscopy;;   BIOPSY  06/08/2020   Procedure: BIOPSY;  Surgeon: Daneil Dolin, MD;  [facility];  Service: Endoscopy;;   CATARACT EXTRACTION Bilateral    COLONOSCOPY  09/2010   Dr. Posey Pronto: mild diverticulsis in sigmoid colon.    COLONOSCOPY WITH PROPOFOL N/A 09/16/2019   Rourk: Diverticulosis, random colon biopsies negative for microscopic colitis.   COLONOSCOPY WITH PROPOFOL N/A 06/08/2020   Procedure: COLONOSCOPY WITH PROPOFOL;  Surgeon: Daneil Dolin, MD;  [facility];  Service: Endoscopy;  Laterality: N/A;  9:15am   ESOPHAGOGASTRODUODENOSCOPY (EGD) WITH PROPOFOL N/A 04/28/2020   Procedure: ESOPHAGOGASTRODUODENOSCOPY (EGD) WITH PROPOFOL;  Surgeon: Daneil Dolin, MD;  [facility];  Service: Endoscopy;  Laterality: N/A;  3:15   YAG LASER APPLICATION Left 17/61/6073   Procedure: YAG LASER APPLICATION;  Surgeon: Williams Che, MD;  Location: [facility];  Service: Ophthalmology;  Laterality: Left;     SOCIAL HISTORY:  Social  History   Socioeconomic History   Marital status: Widowed    Spouse name: Not on file   Number of children: 1   Years of education: Not on file   Highest education level: Not on file  Occupational History   Occupation: DISABLED  Tobacco Use   Smoking status: Former    Packs/day: 1.00    Years: 15.00    Total pack years: 15.00    Types: Cigarettes    Quit date: 09/11/2017    Years since  quitting: 4.6   Smokeless tobacco: Former  Scientific laboratory technician Use: Never used  Substance and Sexual Activity   Alcohol use: Never   Drug use: Never   Sexual activity: Not Currently  Other Topics Concern   Not on file  Social History Narrative   Not on file   Social Determinants of Health   Financial Resource Strain: Low Risk  (09/03/2020)   Overall Financial Resource Strain (CARDIA)    Difficulty of Paying Living Expenses: Not hard at all  Food Insecurity: No Food Insecurity (09/03/2020)   Hunger Vital Sign    Worried About Running Out of Food in the Last Year: Never true    [place] in the Last Year: Never true  Transportation Needs: No Transportation Needs (09/03/2020)   PRAPARE - Hydrologist (Medical): No    Lack of Transportation (Non-Medical): No  Physical Activity: Insufficiently Active (09/03/2020)   Exercise Vital Sign    Days of Exercise per Week: 3 days    Minutes of Exercise per Session: 30 min  Stress: No Stress Concern Present (09/03/2020)   [place]    Feeling of Stress : Only a little  Social Connections: Socially Isolated (09/03/2020)   Social Connection and Isolation Panel [NHANES]    Frequency of Communication with Friends and Family: More than three times a week    Frequency of Social Gatherings with Friends and Family: More than three times a week    Attends Religious Services: Never    Marine scientist or Organizations: No    Attends Archivist Meetings: Never    Marital Status: Widowed  Intimate Partner Violence: Not At Risk (09/03/2020)   Humiliation, Afraid, Rape, and Kick questionnaire    Fear of Current or Ex-Partner: No    Emotionally Abused: No    Physically Abused: No    Sexually Abused: No    FAMILY HISTORY:  Family History  Problem Relation Age of Onset   Stroke Mother    Heart disease Mother    Diabetes Mother     Breast cancer Mother    Other Father        stomach taken out for some reason   Migraines Daughter    Colon cancer Neg Hx     CURRENT MEDICATIONS:  Outpatient Encounter Medications as of 05/12/2022  Medication Sig   albuterol (VENTOLIN HFA) 108 (90 Base) MCG/ACT inhaler Inhale 2 puffs into the lungs every 6 (six) hours as needed for wheezing or shortness of breath.   calcium carbonate (OSCAL) 1500 (600 Ca) MG TABS tablet Take by mouth.   Cholecalciferol (VITAMIN D3) 125 MCG (5000 UT) TABS Take 1 tablet (5,000 Units total) by mouth daily.   citalopram (CELEXA) 40 MG tablet TAKE ONE TABLET ONCE DAILY   folic acid (FOLVITE) 1 MG tablet Take 1 tablet (1 mg total) by mouth daily.  gabapentin (NEURONTIN) 300 MG capsule Take 1 capsule (300 mg total) by mouth 3 (three) times daily.   golimumab (SIMPONI ARIA) 50 MG/4ML SOLN injection Inject into the vein.   hydroxychloroquine (PLAQUENIL) 200 MG tablet Take 200 mg by mouth daily.    leflunomide (ARAVA) 20 MG tablet Take 20 mg by mouth daily.   levothyroxine (SYNTHROID) 125 MCG tablet Take 1 tablet (125 mcg total) by mouth every morning.   loperamide (IMODIUM A-D) 2 MG tablet Take 1 tablet (2 mg total) by mouth in the morning. Take second dose if needed (Patient taking differently: Take 2 mg by mouth as needed. Take second dose if needed)   megestrol (MEGACE) 400 MG/10ML suspension Take 10 mLs (400 mg total) by mouth 2 (two) times daily.   methocarbamol (ROBAXIN) 500 MG tablet Take 1 tablet (500 mg total) by mouth 2 (two) times daily.   pantoprazole (PROTONIX) 40 MG tablet TAKE ONE TABLET BY MOUTH DAILY BEFORE BREAKFAST   potassium chloride SA (KLOR-CON M) 20 MEQ tablet TAKE ONE TABLET ONCE DAILY, NEED DOCTOR APPT.   pravastatin (PRAVACHOL) 40 MG tablet TAKE ONE TABLET AT BEDTIME   sulfaDIAZINE 500 MG tablet Take 500 mg by mouth 2 (two) times daily.   sulfaSALAzine (AZULFIDINE) 500 MG tablet 1 tablet   traZODone (DESYREL) 100 MG tablet Take 1 tablet  (100 mg total) by mouth at bedtime as needed. for sleep   No facility-administered encounter medications on file as of 05/12/2022.    ALLERGIES:  Allergies  Allergen Reactions   Methotrexate Other (See Comments)   Upadacitinib Other (See Comments)     PHYSICAL EXAM:      ECOG PERFORMANCE STATUS: 1 - Symptomatic but completely ambulatory  There were no vitals filed for this visit. There were no vitals filed for this visit. Physical Exam Vitals reviewed.  Constitutional:      Appearance: Normal appearance.  Cardiovascular:     Rate and Rhythm: Normal rate and regular rhythm.     Pulses: Normal pulses.     Heart sounds: Normal heart sounds.  Pulmonary:     Effort: Pulmonary effort is normal.     Breath sounds: Normal breath sounds.  Neurological:     General: No focal deficit present.     Mental Status: She is alert and oriented to person, place, and time.  Psychiatric:        Mood and Affect: Mood normal.        Behavior: Behavior normal.    LABORATORY DATA:  I have reviewed the labs as listed.  CBC    Component Value Date/Time   WBC 8.4 05/04/2022 1057   RBC 2.63 (L) 05/04/2022 1057   HGB 7.2 (L) 05/04/2022 1057   HGB 7.7 (LL) 04/20/2022 1103   HCT 25.3 (L) 05/04/2022 1057   HCT 25.4 (L) 04/20/2022 1103   PLT 372 05/04/2022 1057   PLT 453 (H) 04/20/2022 1103   MCV 96.2 05/04/2022 1057   MCV 89 04/20/2022 1103   MCH 27.4 05/04/2022 1057   MCHC 28.5 (L) 05/04/2022 1057   RDW 17.0 (H) 05/04/2022 1057   RDW 14.5 04/20/2022 1103   LYMPHSABS 2.1 04/28/2022 1406   LYMPHSABS 1.8 04/20/2022 1103   MONOABS 0.8 04/28/2022 1406   EOSABS 0.2 04/28/2022 1406   EOSABS 0.2 04/20/2022 1103   BASOSABS 0.0 04/28/2022 1406   BASOSABS 0.0 04/20/2022 1103      Latest Ref Rng & Units 04/28/2022    2:08 PM 02/23/2022  12:25 PM 01/10/2022   12:08 PM  CMP  Glucose 70 - 99 mg/dL 183  159  199   BUN 8 - 23 mg/dL '9  7  6   '$ Creatinine 0.44 - 1.00 mg/dL 0.85  0.84  0.67   Sodium  135 - 145 mmol/L 136  135  137   Potassium 3.5 - 5.1 mmol/L 3.8  3.9  3.6   Chloride 98 - 111 mmol/L 108  102  102   CO2 22 - 32 mmol/L '21  27  26   '$ Calcium 8.9 - 10.3 mg/dL 8.2  8.1  8.7   Total Protein 6.5 - 8.1 g/dL 6.2  5.2  5.8   Total Bilirubin 0.3 - 1.2 mg/dL 0.4  0.3  0.3   Alkaline Phos 38 - 126 U/L 68  111  114   AST 15 - 41 U/L '10  11  11   '$ ALT 0 - 44 U/L '7  6  6     '$ DIAGNOSTIC IMAGING:  I have independently reviewed the relevant imaging and discussed with the patient.  ASSESSMENT & PLAN: 1.  Acute on chronic normocytic anemia - CBC on 03/20/2020 shows hemoglobin 9.9 with MCV of 93.  White count and platelets were normal. - Colonoscopy on 09/16/2019 shows diverticulosis in the entire examined colon.  Nonbleeding internal hemorrhoids. -  BMBX on 07/19/2021: Mildly hypercellular marrow with trilineage hematopoiesis.  No increased ring sideroblasts or blasts.  3% polytypic plasma cells.  No large aggregates of plasma cells.  Iron stores increased.  Chromosome analysis normal. - Stool for occult blood was negative x3 in May 2023, negative x3 in August 2023. - She denies any history of blood transfusion.  She does not take aspirin or any blood thinners. - Labs checked by PCP on 04/20/2022 showed acute worsening of her anemia with Hgb 7.7 (down from 9.2 on 03/07/2022). - Iron panel by PCP (04/20/2022) showed elevated ferritin 679 (acute phase reactant in the setting of RA?),  with low iron saturation 7% and low serum iron 15, low TIBC 214 - Additional heme work-up (04/28/2022): Hgb 7.4/MCV 95.6, platelets 429, normal differential. Normal kidney function with creatinine 0.85 and GFR >60 Elevated reticulocytes at 3.5%.  Normal haptoglobin, LDH, bilirubin. Borderline vitamin B12 at 221 with methylmalonic acid normal at 99.  Normal folate. - Chronic fatigue is at baseline - No bright red blood per rectum or melena  - Labs today (05/12/2022): Hgb 7.3/MCV 93.8, ferritin 454, iron saturation 7%  with TIBC 234 and serum iron 16 - She takes leflunomide, hydroxychloroquine, and golimumab for rheumatoid arthritis   - PLAN: Differential diagnosis favors anemia secondary to functional iron deficiency in the setting of chronic inflammatory disease.  Ferritin is likely elevated as acute phase reactant in the setting of RA. - We will trial IV iron with monthly Feraheme x 2 doses. - Monthly CBC with BB sample - Repeat labs and RTC as scheduled in October 2023. - Patient educated on alarm symptoms that would prompt immediate medical attention/ED visit.   2.  Other issues:  Patient also follows with clinic for the following issues, which will be addressed in more detail at follow-up visit.  (Addressed by Dr. Delton Coombes on 03/14/2022) Bence-Jones proteinuria (due for repeat labs every 6 months - December 2023)  Weight loss: Improved appetite and improved weight after starting Megace. Rheumatoid arthritis Hypokalemia Depression Osteoporosis: Next Zometa due around 02/23/2023   All questions were answered. The patient knows to call the clinic  with any problems, questions or concerns.  Medical decision making: Moderate    Time spent on visit: I spent 20 minutes counseling the patient face to face. The total time spent in the appointment was 30 minutes and more than 50% was on counseling.   Harriett Rush, PA-C   05/12/22 5:57 PM

## 2022-05-11 NOTE — Telephone Encounter (Signed)
Order has been changed

## 2022-05-12 ENCOUNTER — Inpatient Hospital Stay (HOSPITAL_BASED_OUTPATIENT_CLINIC_OR_DEPARTMENT_OTHER): Payer: Medicare Other | Admitting: Physician Assistant

## 2022-05-12 ENCOUNTER — Other Ambulatory Visit: Payer: Self-pay

## 2022-05-12 ENCOUNTER — Inpatient Hospital Stay: Payer: Medicare Other

## 2022-05-12 VITALS — BP 113/77 | HR 89 | Temp 98.3°F | Resp 18 | Ht 64.0 in | Wt 108.7 lb

## 2022-05-12 DIAGNOSIS — D500 Iron deficiency anemia secondary to blood loss (chronic): Secondary | ICD-10-CM

## 2022-05-12 DIAGNOSIS — Z87891 Personal history of nicotine dependence: Secondary | ICD-10-CM | POA: Diagnosis not present

## 2022-05-12 DIAGNOSIS — R634 Abnormal weight loss: Secondary | ICD-10-CM | POA: Diagnosis not present

## 2022-05-12 DIAGNOSIS — D649 Anemia, unspecified: Secondary | ICD-10-CM

## 2022-05-12 DIAGNOSIS — E876 Hypokalemia: Secondary | ICD-10-CM | POA: Diagnosis not present

## 2022-05-12 DIAGNOSIS — Z803 Family history of malignant neoplasm of breast: Secondary | ICD-10-CM | POA: Diagnosis not present

## 2022-05-12 DIAGNOSIS — Z8616 Personal history of COVID-19: Secondary | ICD-10-CM | POA: Diagnosis not present

## 2022-05-12 DIAGNOSIS — R803 Bence Jones proteinuria: Secondary | ICD-10-CM | POA: Diagnosis not present

## 2022-05-12 DIAGNOSIS — M810 Age-related osteoporosis without current pathological fracture: Secondary | ICD-10-CM | POA: Diagnosis not present

## 2022-05-12 DIAGNOSIS — F32A Depression, unspecified: Secondary | ICD-10-CM | POA: Diagnosis not present

## 2022-05-12 DIAGNOSIS — M069 Rheumatoid arthritis, unspecified: Secondary | ICD-10-CM | POA: Diagnosis not present

## 2022-05-12 DIAGNOSIS — Z79899 Other long term (current) drug therapy: Secondary | ICD-10-CM | POA: Diagnosis not present

## 2022-05-12 LAB — CBC WITH DIFFERENTIAL/PLATELET
Abs Immature Granulocytes: 0.01 10*3/uL (ref 0.00–0.07)
Basophils Absolute: 0 10*3/uL (ref 0.0–0.1)
Basophils Relative: 0 %
Eosinophils Absolute: 0.2 10*3/uL (ref 0.0–0.5)
Eosinophils Relative: 3 %
HCT: 25.5 % — ABNORMAL LOW (ref 36.0–46.0)
Hemoglobin: 7.3 g/dL — ABNORMAL LOW (ref 12.0–15.0)
Immature Granulocytes: 0 %
Lymphocytes Relative: 37 %
Lymphs Abs: 2 10*3/uL (ref 0.7–4.0)
MCH: 26.8 pg (ref 26.0–34.0)
MCHC: 28.6 g/dL — ABNORMAL LOW (ref 30.0–36.0)
MCV: 93.8 fL (ref 80.0–100.0)
Monocytes Absolute: 0.6 10*3/uL (ref 0.1–1.0)
Monocytes Relative: 11 %
Neutro Abs: 2.7 10*3/uL (ref 1.7–7.7)
Neutrophils Relative %: 49 %
Platelets: 391 10*3/uL (ref 150–400)
RBC: 2.72 MIL/uL — ABNORMAL LOW (ref 3.87–5.11)
RDW: 16.8 % — ABNORMAL HIGH (ref 11.5–15.5)
WBC: 5.5 10*3/uL (ref 4.0–10.5)
nRBC: 0 % (ref 0.0–0.2)

## 2022-05-12 LAB — TYPE AND SCREEN
ABO/RH(D): O POS
Antibody Screen: NEGATIVE

## 2022-05-12 LAB — IRON AND TIBC
Iron: 16 ug/dL — ABNORMAL LOW (ref 28–170)
Saturation Ratios: 7 % — ABNORMAL LOW (ref 10.4–31.8)
TIBC: 234 ug/dL — ABNORMAL LOW (ref 250–450)
UIBC: 218 ug/dL

## 2022-05-12 LAB — SAMPLE TO BLOOD BANK

## 2022-05-12 LAB — FERRITIN: Ferritin: 454 ng/mL — ABNORMAL HIGH (ref 11–307)

## 2022-05-12 NOTE — Patient Instructions (Signed)
[facility] **   You were seen today by Tarri Abernethy PA-C for your anemia.    ANEMIA: Your anemia is suspected to be due to chronic inflammation which makes it difficult for your body to use iron to make new blood cells.   We will give you IV iron x2 doses to see if this improves your body's ability to make blood cells. We will continue to check your blood counts once per month. We will see you for follow-up visit in October   ** Thank you for trusting me with your healthcare!  I strive to provide all of my patients with quality care at each visit.  If you receive a survey for this visit, I would be so grateful to you for taking the time to provide feedback.  Thank you in advance!  ~ Kerrington Sova                   Dr. Derek Jack   &   Tarri Abernethy, PA-C   - - - - - - - - - - - - - - - - - -    Thank you for choosing [facility] to provide your oncology and hematology care.  To afford each patient quality time with our provider, please arrive at least 15 minutes before your scheduled appointment time.   If you have a lab appointment with the [facility] please come in thru the Main Entrance and check in at the main information desk.  You need to re-schedule your appointment should you arrive 10 or more minutes late.  We strive to give you quality time with our providers, and arriving late affects you and other patients whose appointments are after yours.  Also, if you no show three or more times for appointments you may be dismissed from the clinic at the providers discretion.     Again, thank you for choosing [facility].  Our hope is that these requests will decrease the amount of time that you wait before being seen by our physicians.       _____________________________________________________________  Should you have questions after your visit  to [facility], please contact our office at [phone] and follow the prompts.  Our office hours are 8:00 a.m. and 4:30 p.m. Monday - Friday.  Please note that voicemails left after 4:00 p.m. may not be returned until the following business day.  We are closed weekends and major holidays.  You do have access to a nurse 24-7, just call the main number to the clinic [phone] and do not press any options, hold on the line and a nurse will answer the phone.    For prescription refill requests, have your pharmacy contact our office and allow 72 hours.

## 2022-05-13 ENCOUNTER — Inpatient Hospital Stay: Payer: Medicare Other

## 2022-05-18 ENCOUNTER — Inpatient Hospital Stay: Payer: Medicare Other

## 2022-05-18 VITALS — BP 114/76 | HR 71 | Temp 97.2°F | Resp 18

## 2022-05-18 DIAGNOSIS — R803 Bence Jones proteinuria: Secondary | ICD-10-CM | POA: Diagnosis not present

## 2022-05-18 DIAGNOSIS — E876 Hypokalemia: Secondary | ICD-10-CM | POA: Diagnosis not present

## 2022-05-18 DIAGNOSIS — M069 Rheumatoid arthritis, unspecified: Secondary | ICD-10-CM | POA: Diagnosis not present

## 2022-05-18 DIAGNOSIS — F32A Depression, unspecified: Secondary | ICD-10-CM | POA: Diagnosis not present

## 2022-05-18 DIAGNOSIS — M810 Age-related osteoporosis without current pathological fracture: Secondary | ICD-10-CM | POA: Diagnosis not present

## 2022-05-18 DIAGNOSIS — Z79899 Other long term (current) drug therapy: Secondary | ICD-10-CM | POA: Diagnosis not present

## 2022-05-18 DIAGNOSIS — Z803 Family history of malignant neoplasm of breast: Secondary | ICD-10-CM | POA: Diagnosis not present

## 2022-05-18 DIAGNOSIS — D649 Anemia, unspecified: Secondary | ICD-10-CM | POA: Diagnosis not present

## 2022-05-18 DIAGNOSIS — Z87891 Personal history of nicotine dependence: Secondary | ICD-10-CM | POA: Diagnosis not present

## 2022-05-18 DIAGNOSIS — Z8616 Personal history of COVID-19: Secondary | ICD-10-CM | POA: Diagnosis not present

## 2022-05-18 DIAGNOSIS — R634 Abnormal weight loss: Secondary | ICD-10-CM | POA: Diagnosis not present

## 2022-05-18 MED ORDER — LORATADINE 10 MG PO TABS
10.0000 mg | ORAL_TABLET | Freq: Once | ORAL | Status: AC
  Administered 2022-05-18: 10 mg via ORAL
  Filled 2022-05-18: qty 1

## 2022-05-18 MED ORDER — SODIUM CHLORIDE 0.9 % IV SOLN
510.0000 mg | Freq: Once | INTRAVENOUS | Status: AC
  Administered 2022-05-18: 510 mg via INTRAVENOUS
  Filled 2022-05-18: qty 17

## 2022-05-18 MED ORDER — SODIUM CHLORIDE 0.9 % IV SOLN: Freq: Once | INTRAVENOUS | Status: AC

## 2022-05-18 MED ORDER — ACETAMINOPHEN 325 MG PO TABS
650.0000 mg | ORAL_TABLET | Freq: Once | ORAL | Status: AC
  Administered 2022-05-18: 650 mg via ORAL
  Filled 2022-05-18: qty 2

## 2022-05-18 NOTE — Progress Notes (Signed)
Patient presents today for Feraheme IV iron infusion. Vital signs are stable. Patient denies any complaints today. MAR reviewed and updated.  Patient has pain in her left shoulder she rates a 7/10 . Chronic pain.   Feraheme given today per MD orders. Tolerated infusion without adverse affects. Vital signs stable. No complaints at this time. Discharged from clinic ambulatory in stable condition. Alert and oriented x 3. F/U with [facility] as scheduled.

## 2022-05-18 NOTE — Patient Instructions (Signed)
[facility]  Discharge Instructions: Thank you for choosing [facility] to provide your oncology and hematology care.  If you have a lab appointment with the Cancer Center, please come in thru the Main Entrance and check in at the main information desk.  Wear comfortable clothing and clothing appropriate for easy access to any Portacath or PICC line.   We strive to give you quality time with your provider. You may need to reschedule your appointment if you arrive late (15 or more minutes).  Arriving late affects you and other patients whose appointments are after yours.  Also, if you miss three or more appointments without notifying the office, you may be dismissed from the clinic at the provider's discretion.      For prescription refill requests, have your pharmacy contact our office and allow 72 hours for refills to be completed.    Today you received the following chemotherapy and/or immunotherapy agents Feraheme.  Ferumoxytol Injection What is this medication? FERUMOXYTOL (FER ue MOX i tol) treats low levels of iron in your body (iron deficiency anemia). Iron is a mineral that plays an important role in making red blood cells, which carry oxygen from your lungs to the rest of your body. This medicine may be used for other purposes; ask your health care provider or pharmacist if you have questions. COMMON BRAND NAME(S): Feraheme What should I tell my care team before I take this medication? They need to know if you have any of these conditions: Anemia not caused by low iron levels High levels of iron in the blood Magnetic resonance imaging (MRI) test scheduled An unusual or allergic reaction to iron, other medications, foods, dyes, or preservatives Pregnant or trying to get pregnant Breast-feeding How should I use this medication? This medication is for injection into a vein. It is given in a hospital or clinic setting. Talk to your care team the use  of this medication in children. Special care may be needed. Overdosage: If you think you have taken too much of this medicine contact a poison control center or emergency room at once. NOTE: This medicine is only for you. Do not share this medicine with others. What if I miss a dose? It is important not to miss your dose. Call your care team if you are unable to keep an appointment. What may interact with this medication? Other iron products This list may not describe all possible interactions. Give your health care provider a list of all the medicines, herbs, non-prescription drugs, or dietary supplements you use. Also tell them if you smoke, drink alcohol, or use illegal drugs. Some items may interact with your medicine. What should I watch for while using this medication? Visit your care team regularly. Tell your care team if your symptoms do not start to get better or if they get worse. You may need blood work done while you are taking this medication. You may need to follow a special diet. Talk to your care team. Foods that contain iron include: whole grains/cereals, dried fruits, beans, or peas, leafy green vegetables, and organ meats (liver, kidney). What side effects may I notice from receiving this medication? Side effects that you should report to your care team as soon as possible: Allergic reactions--skin rash, itching, hives, swelling of the face, lips, tongue, or throat Low blood pressure--dizziness, feeling faint or lightheaded, blurry vision Shortness of breath Side effects that usually do not require medical attention (report to your care team   if they continue or are bothersome): Flushing Headache Joint pain Muscle pain Nausea Pain, redness, or irritation at injection site This list may not describe all possible side effects. Call your doctor for medical advice about side effects. You may report side effects to FDA at 1-800-FDA-1088. Where should I keep my medication? This  medication is given in a hospital or clinic and will not be stored at home. NOTE: This sheet is a summary. It may not cover all possible information. If you have questions about this medicine, talk to your doctor, pharmacist, or health care provider.  2023 Elsevier/Gold Standard (2021-02-05 00:00:00)       To help prevent nausea and vomiting after your treatment, we encourage you to take your nausea medication as directed.  BELOW ARE SYMPTOMS THAT SHOULD BE REPORTED IMMEDIATELY: *FEVER GREATER THAN 100.4 F (38 C) OR HIGHER *CHILLS OR SWEATING *NAUSEA AND VOMITING THAT IS NOT CONTROLLED WITH YOUR NAUSEA MEDICATION *UNUSUAL SHORTNESS OF BREATH *UNUSUAL BRUISING OR BLEEDING *URINARY PROBLEMS (pain or burning when urinating, or frequent urination) *BOWEL PROBLEMS (unusual diarrhea, constipation, pain near the anus) TENDERNESS IN MOUTH AND THROAT WITH OR WITHOUT PRESENCE OF ULCERS (sore throat, sores in mouth, or a toothache) UNUSUAL RASH, SWELLING OR PAIN  UNUSUAL VAGINAL DISCHARGE OR ITCHING   Items with * indicate a potential emergency and should be followed up as soon as possible or go to the Emergency Department if any problems should occur.  Please show the CHEMOTHERAPY ALERT CARD or IMMUNOTHERAPY ALERT CARD at check-in to the Emergency Department and triage nurse.  Should you have questions after your visit or need to cancel or reschedule your appointment, please contact [facility] [phone]  and follow the prompts.  Office hours are 8:00 a.m. to 4:30 p.m. Monday - Friday. Please note that voicemails left after 4:00 p.m. may not be returned until the following business day.  We are closed weekends and major holidays. You have access to a nurse at all times for urgent questions. Please call the main number to the clinic [phone] and follow the prompts.  For any non-urgent questions, you may also contact your provider using MyChart. We now offer e-Visits for  anyone 18 and older to request care online for non-urgent symptoms. For details visit [email or web address].   Also download the MyChart app! Go to the app store, search "MyChart", open the app, select [facility], and log in with your MyChart username and password.  Masks are optional in the cancer centers. If you would like for your care team to wear a mask while they are taking care of you, please let them know. You may have one support person who is at least 65 years old accompany you for your appointments.  

## 2022-05-20 ENCOUNTER — Encounter: Payer: Self-pay | Admitting: Sports Medicine

## 2022-05-20 ENCOUNTER — Ambulatory Visit (INDEPENDENT_AMBULATORY_CARE_PROVIDER_SITE_OTHER): Payer: Medicare Other

## 2022-05-20 ENCOUNTER — Ambulatory Visit (INDEPENDENT_AMBULATORY_CARE_PROVIDER_SITE_OTHER): Payer: Medicare Other | Admitting: Sports Medicine

## 2022-05-20 ENCOUNTER — Ambulatory Visit: Payer: Self-pay

## 2022-05-20 DIAGNOSIS — M7502 Adhesive capsulitis of left shoulder: Secondary | ICD-10-CM

## 2022-05-20 DIAGNOSIS — M25512 Pain in left shoulder: Secondary | ICD-10-CM

## 2022-05-20 DIAGNOSIS — M19012 Primary osteoarthritis, left shoulder: Secondary | ICD-10-CM | POA: Diagnosis not present

## 2022-05-20 DIAGNOSIS — M069 Rheumatoid arthritis, unspecified: Secondary | ICD-10-CM | POA: Diagnosis not present

## 2022-05-20 NOTE — Progress Notes (Signed)
Office Visit Note   Patient: Erica Beasley           Date of Birth: 1956/08/11           MRN: [id number] Visit Date: 05/20/2022              Requested by: Erica Beasley, [place],  [place] [id number] PCP: Erica Brooklyn, FNP   Assessment & Plan: Visit Diagnoses:  1. Adhesive capsulitis of left shoulder   2. Acute pain of left shoulder   3. Osteoarthritis of left shoulder, unspecified osteoarthritis type   4. Rheumatoid arthritis, involving unspecified site, unspecified whether rheumatoid factor present ([place])    Plan: The etiology of Erica Beasley's pain may in fact be multifactorial, but seems most indicative of adhesive capsulitis based on her story and RF's (hypothyroid, RA).  She has extremely reduced active and passive range of motion of the shoulder, which likely is AC coming from her immobility after a painful shingles vaccine. She does have severe arthritis however of the glenohumeral joint as well but reports that she had no pain and a sudden onset of reduction in range of motion shortly following her shingles vaccine.  We did talk through all treatment options, elected to proceed with intra-articular glenohumeral joint injection under ultrasound.  Patient tolerated well.  I printed out and demonstrated a set of shoulder mobility exercises (wall walks, int/ext rotation, etc.) for her to attempt to improve her range of motion.  She will follow-up in 2 weeks with me and we will reassess.  We may consider 1 additional injection to help with her adhesive capsulitis if we can feel this is still her primary diagnosis.  She may use ice or any Tylenol for any postinjection pain.   Follow-Up Instructions: F/u in 2 weeks  Orders:  Orders Placed This Encounter  Procedures   XR Shoulder Left   [place]   No orders of the defined types were placed in this encounter.   Subjective: Chief Complaint  Patient presents with   Left Shoulder - Pain    HPI Erica Beasley is  a pleasant 66 year old female who presents for left shoulder pain x4 weeks after receiving her shingles vaccination. Reports has a history of rheumatoid arthritis -- used to be on oral prednisone, currently is not. Does have history of hypothyroidism, on Synthroid. She states the shingles vaccine was painful and she had immediate pain later that day and the following day after the injection.  She denies any other injury to the shoulder that she knows of.  She denied having any issues or pain with the shoulder prior to the vaccination.  She does have a notable history of rheumatoid arthritis and had been on oral prednisone for many years in the past, however currently is off of that.  She reports a pain deep within the shoulder as well as a new onset of stiffness and limited mobility about the shoulder.  She denies any fever or chills, no redness or swelling.  Review of Systems - denies any fever or chills   - reports multiple joints pain (hx of RA)   Objective: Vital Signs:  - HR: 81 - RR: 17  Physical Exam Gen: Well-appearing, in no acute distress; non-toxic CV: Regular Rate. Well-perfused. Warm.  Resp: Breathing unlabored on room air; no wheezing. Psych: Fluid speech in conversation; appropriate affect; normal thought process Neuro: Sensation intact throughout. No gross coordination deficits.   Ortho Exam -  Left shoulder:   Specialty Comments:  No specialty comments available.  Imaging: [place] Extrem Up Left Ltd  Result Date: 05/20/2022 Successful evaluation and injection of the left glenohumeral joint under ultrasound guidance.  XR Shoulder Left  Result Date: 05/20/2022 4 views of the left shoulder including AP, Grashey, internal rotation and scapular Y views were ordered and reviewed by myself.  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  There is sclerosis noted within the glenoid.     Procedures:  Procedure: US-guided glenohumeral joint injection, left shoulder After informed verbal consent and discussion on risks/benefits/indications was given, a timeout was performed, patient was lying lateral recumbent on exam table. The patient's left shoulder was prepped with multiple alcohol swabs and utilizing ultrasound guidance, the patient's glenohumeral joint was identified on ultrasound. Using ultrasound guidance a 22-gauge, 3.5 inch needle with a mixture of 2:2:1 cc's lidocaine:bupivicaine:depomedrol was directed from a lateral to medial direction via in-plane technique into the glenohumeral joint with visualization of appropriate spread of injectate into the joint. Patient tolerated the procedure well without immediate complications.    PMFS History: Patient Active Problem List   Diagnosis Date Noted   Symptomatic anemia 05/12/2022   Osteoporosis 01/17/2022   Hypothyroidism 11/13/2020   B12 deficiency 11/13/2020   COPD without exacerbation ([place]) 11/13/2020   Vitamin D deficiency 11/13/2020   Neuropathy 11/13/2020   Rheumatoid arthritis involving multiple sites ([place]) 11/13/2020   Mixed hyperlipidemia 11/13/2020   Aortic atherosclerosis ([place]) 11/13/2020   GERD (gastroesophageal reflux disease) 11/05/2020   Depression, recurrent ([place]) 09/22/2020   Dehydration 08/13/2020   Hypokalemia 08/13/2020   Abnormal CT of the abdomen 06/02/2020   N&V (nausea and vomiting) 04/08/2020   Monoclonal gammopathy 04/03/2020   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 03/26/2020   Hx of colonic polyps 05/03/2019   H/O Clostridium difficile infection 05/03/2019   Normocytic anemia 05/03/2019   Chronic diarrhea 01/29/2019   Past Medical History:  Diagnosis Date   Aortic atherosclerosis ([place]) 11/13/2020   CKD (chronic kidney disease) stage 2, GFR 60-89 ml/min    COPD (chronic obstructive pulmonary disease) ([place])    COVID-19 06/2019   Depression    Essential hypertension     GERD (gastroesophageal reflux disease)    History of diabetes mellitus    Hyperlipidemia    Hypothyroidism    Neuropathy    Rheumatoid arthritis (HCC)    Vitamin B12 deficiency    Vitamin D deficiency     Family History  Problem Relation Age of Onset   Stroke Mother    Heart disease Mother    Diabetes Mother    Breast cancer Mother    Other Father        stomach taken out for some reason   Migraines Daughter    Colon cancer Neg Hx     Past Surgical History:  Procedure Laterality Date   BIOPSY  09/16/2019   Procedure: BIOPSY;  Surgeon: Daneil Dolin, MD;  [facility];  Service: Endoscopy;;   BIOPSY  04/28/2020   Procedure: BIOPSY;  Surgeon: Daneil Dolin, MD;  [facility];  Service: Endoscopy;;   BIOPSY  06/08/2020   Procedure: BIOPSY;  Surgeon: Daneil Dolin, MD;  [facility];  Service: Endoscopy;;   CATARACT EXTRACTION Bilateral    COLONOSCOPY  09/2010   Dr. Posey Pronto: mild diverticulsis in sigmoid colon.    COLONOSCOPY WITH PROPOFOL  N/A 09/16/2019   Rourk: Diverticulosis, random colon biopsies negative for microscopic colitis.   COLONOSCOPY WITH PROPOFOL N/A 06/08/2020   Procedure: COLONOSCOPY WITH PROPOFOL;  Surgeon: Daneil Dolin, MD;  [facility];  Service: Endoscopy;  Laterality: N/A;  9:15am   ESOPHAGOGASTRODUODENOSCOPY (EGD) WITH PROPOFOL N/A 04/28/2020   Procedure: ESOPHAGOGASTRODUODENOSCOPY (EGD) WITH PROPOFOL;  Surgeon: Daneil Dolin, MD;  [facility];  Service: Endoscopy;  Laterality: N/A;  1:61   YAG LASER APPLICATION Left 09/60/4540   Procedure: YAG LASER APPLICATION;  Surgeon: Williams Che, MD;  Location: [facility];  Service: Ophthalmology;  Laterality: Left;   Social History   Occupational History   Occupation: DISABLED  Tobacco Use   Smoking status: Former    Packs/day: 1.00    Years: 15.00    Total pack years: 15.00    Types: Cigarettes    Quit date: 09/11/2017    Years since quitting:  4.6   Smokeless tobacco: Former  Scientific laboratory technician Use: Never used  Substance and Sexual Activity   Alcohol use: Never   Drug use: Never   Sexual activity: Not Currently

## 2022-05-20 NOTE — Progress Notes (Signed)
Patient states her shoulder has been bothering her for 4 weeks, pain since she got her shingles injection. Painful ROM. No known injury.

## 2022-05-24 ENCOUNTER — Encounter: Payer: Self-pay | Admitting: Family Medicine

## 2022-05-24 ENCOUNTER — Ambulatory Visit (INDEPENDENT_AMBULATORY_CARE_PROVIDER_SITE_OTHER): Payer: Medicare Other | Admitting: Family Medicine

## 2022-05-24 VITALS — BP 120/76 | HR 96 | Temp 96.7°F | Ht 64.0 in | Wt 107.8 lb

## 2022-05-24 DIAGNOSIS — L600 Ingrowing nail: Secondary | ICD-10-CM | POA: Diagnosis not present

## 2022-05-24 NOTE — Patient Instructions (Signed)
Epsom salt soaks 2-3 times per day for 20 minutes.

## 2022-05-24 NOTE — Progress Notes (Signed)
Assessment & Plan:  1. Ingrown nail of right index finger Education provided on ingrown nails.  Encouraged Epsom salt soaks x20 minutes 3 times per day.   Follow up plan: Return if symptoms worsen or fail to improve.  Hendricks Limes, MSN, APRN, FNP-C [facility]  Subjective:   Patient ID: Erica Beasley, female    DOB: [birth year], 66 y.o.   MRN: [id number]  HPI: Erica Beasley is a 66 y.o. female presenting on 05/24/2022 for Hand Pain (Right pointer finger x 2-3 weeks )  Patient reports pain on the right side of her right pointer finger fingernail for the past 2 to 3 weeks.  Denies any injury.  No swelling or drainage per her report.  No home treatments.   ROS: Negative unless specifically indicated above in HPI.   Relevant past medical history reviewed and updated as indicated.   Allergies and medications reviewed and updated.   Current Outpatient Medications:    albuterol (VENTOLIN HFA) 108 (90 Base) MCG/ACT inhaler, Inhale 2 puffs into the lungs every 6 (six) hours as needed for wheezing or shortness of breath., Disp: , Rfl:    calcium carbonate (OSCAL) 1500 (600 Ca) MG TABS tablet, Take by mouth., Disp: , Rfl:    Cholecalciferol (VITAMIN D3) 125 MCG (5000 UT) TABS, Take 1 tablet (5,000 Units total) by mouth daily., Disp: 30 tablet, Rfl: 3   citalopram (CELEXA) 40 MG tablet, TAKE ONE TABLET ONCE DAILY, Disp: 30 tablet, Rfl: 5   folic acid (FOLVITE) 1 MG tablet, Take 1 tablet (1 mg total) by mouth daily., Disp: 90 tablet, Rfl: 3   gabapentin (NEURONTIN) 300 MG capsule, Take 1 capsule (300 mg total) by mouth 3 (three) times daily., Disp: 270 capsule, Rfl: 1   golimumab (SIMPONI ARIA) 50 MG/4ML SOLN injection, Inject into the vein., Disp: , Rfl:    hydroxychloroquine (PLAQUENIL) 200 MG tablet, Take 200 mg by mouth daily. , Disp: , Rfl:    leflunomide (ARAVA) 20 MG tablet, Take 20 mg by mouth daily., Disp: , Rfl:    levothyroxine (SYNTHROID) 125 MCG tablet,  Take 1 tablet (125 mcg total) by mouth every morning., Disp: 90 tablet, Rfl: 1   loperamide (IMODIUM A-D) 2 MG tablet, Take 1 tablet (2 mg total) by mouth in the morning. Take second dose if needed (Patient taking differently: Take 2 mg by mouth as needed. Take second dose if needed), Disp: 30 tablet, Rfl: 0   megestrol (MEGACE) 400 MG/10ML suspension, Take 10 mLs (400 mg total) by mouth 2 (two) times daily., Disp: 480 mL, Rfl: 2   methocarbamol (ROBAXIN) 500 MG tablet, Take 1 tablet (500 mg total) by mouth 2 (two) times daily., Disp: 20 tablet, Rfl: 0   pantoprazole (PROTONIX) 40 MG tablet, TAKE ONE TABLET BY MOUTH DAILY BEFORE BREAKFAST, Disp: 90 tablet, Rfl: 0   potassium chloride SA (KLOR-CON M) 20 MEQ tablet, TAKE ONE TABLET ONCE DAILY, NEED DOCTOR APPT., Disp: 30 tablet, Rfl: 6   pravastatin (PRAVACHOL) 40 MG tablet, TAKE ONE TABLET AT BEDTIME, Disp: 30 tablet, Rfl: 0   sulfaDIAZINE 500 MG tablet, Take 500 mg by mouth 2 (two) times daily., Disp: , Rfl:    sulfaSALAzine (AZULFIDINE) 500 MG tablet, 1 tablet, Disp: , Rfl:    traZODone (DESYREL) 100 MG tablet, Take 1 tablet (100 mg total) by mouth at bedtime as needed. for sleep, Disp: 90 tablet, Rfl: 1  Allergies  Allergen Reactions   Methotrexate Other (See Comments)  Upadacitinib Other (See Comments)    Objective:   BP 120/76   Pulse 96   Temp (!) 96.7 F (35.9 C) (Temporal)   Ht '5\' 4"'$  (1.626 m)   Wt 107 lb 12.8 oz (48.9 kg)   SpO2 96%   BMI 18.50 kg/m    Physical Exam Vitals reviewed.  Constitutional:      General: She is not in acute distress.    Appearance: Normal appearance. She is not ill-appearing, toxic-appearing or diaphoretic.  HENT:     Head: Normocephalic and atraumatic.  Eyes:     General: No scleral icterus.       Right eye: No discharge.        Left eye: No discharge.     Conjunctiva/sclera: Conjunctivae normal.  Cardiovascular:     Rate and Rhythm: Normal rate.  Pulmonary:     Effort: Pulmonary effort  is normal. No respiratory distress.  Musculoskeletal:        General: Normal range of motion.     Cervical back: Normal range of motion.  Skin:    General: Skin is warm and dry.     Capillary Refill: Capillary refill takes less than 2 seconds.     Comments: Ingrown nail of right pointer finger with mild erythema and tenderness on the right side of the nail.  Neurological:     General: No focal deficit present.     Mental Status: She is alert and oriented to person, place, and time. Mental status is at baseline.  Psychiatric:        Mood and Affect: Mood normal.        Behavior: Behavior normal.        Thought Content: Thought content normal.        Judgment: Judgment normal.

## 2022-05-31 ENCOUNTER — Other Ambulatory Visit: Payer: Self-pay | Admitting: Family Medicine

## 2022-05-31 DIAGNOSIS — E782 Mixed hyperlipidemia: Secondary | ICD-10-CM

## 2022-05-31 DIAGNOSIS — E1165 Type 2 diabetes mellitus with hyperglycemia: Secondary | ICD-10-CM

## 2022-05-31 DIAGNOSIS — I700 Atherosclerosis of aorta: Secondary | ICD-10-CM

## 2022-06-03 ENCOUNTER — Ambulatory Visit (INDEPENDENT_AMBULATORY_CARE_PROVIDER_SITE_OTHER): Payer: Medicare Other | Admitting: Sports Medicine

## 2022-06-03 ENCOUNTER — Encounter: Payer: Self-pay | Admitting: Sports Medicine

## 2022-06-03 DIAGNOSIS — M19012 Primary osteoarthritis, left shoulder: Secondary | ICD-10-CM

## 2022-06-03 DIAGNOSIS — M7502 Adhesive capsulitis of left shoulder: Secondary | ICD-10-CM

## 2022-06-03 NOTE — Progress Notes (Signed)
Pain is not severe, just sore today. Injection last time she feels did not help. Taking tylenol as needed for pain.

## 2022-06-03 NOTE — Patient Instructions (Signed)
Domitila,   -Perform the adhesive capsulitis shoulder exercises at home once daily -You may take 2 tablets of ibuprofen with food in the morning once a day for the shoulder on the days that the pain is bad -I will see you back in the office in 1 month  Dr. Elba Barman [facility]

## 2022-06-03 NOTE — Progress Notes (Signed)
Erica Beasley - 66 y.o. female MRN [id number]  Date of birth: 1956/02/26  Office Visit Note: Visit Date: 06/03/2022 PCP: Loman Brooklyn, FNP Referred by: Loman Brooklyn, FNP  Subjective: Chief Complaint  Patient presents with   Left Shoulder - Pain   HPI: Erica Beasley is a pleasant 66 y.o. female who presents today for follow-up of her left shoulder pain.  I last saw her on 05/20/2022 and diagnosed her with adhesive capsulitis, likely after she had a painful shingles vaccination.  She states over the last 2 weeks her pain has improved, still has some soreness in the shoulder although this is better.  She has been doing the home exercises not every day, but a few times a week and does feel that she has more motion of the shoulder.  She did denies any new injury.  She is taking Tylenol only for her pain, although this does not help much.  No more pain at night, only painful with certain reaching activities.  Pertinent ROS were reviewed with the patient and found to be negative unless otherwise specified above in HPI.   Assessment & Plan: Visit Diagnoses:  1. Adhesive capsulitis of left shoulder   2. Osteoarthritis of left shoulder, unspecified osteoarthritis type    Plan: Discussed with Erica Beasley that she has had improvement in both pain and some range of motion after the injection and the home exercises.  We discussed further treatment options such as formalized physical therapy, continued home therapy, and additional injection.  Given her improvement, she would like to continue exercises.  I did give her a new home exercise program for adhesive capsulitis which was reviewed with my athletic trainer, Lilia Pro.  Advised it is okay if she takes ibuprofen once a day as opposed to Tylenol with food for her breakthrough pain. She will follow-up in 1 month for reevaluation.   Follow-up: Return in about 4 weeks (around 07/01/2022).   Meds & Orders: No orders of the defined types were placed in this  encounter.  No orders of the defined types were placed in this encounter.       Clinical History: No specialty comments available.  She reports that she quit smoking about 4 years ago. Her smoking use included cigarettes. She has a 15.00 pack-year smoking history. She has quit using smokeless tobacco.  Recent Labs    08/06/21 1431 01/28/22 1158  HGBA1C 5.9* 4.8    Objective:    Physical Exam  Gen: Well-appearing, in no acute distress; non-toxic CV: Regular Rate. Well-perfused. Warm.  Resp: Breathing unlabored on room air; no wheezing. Psych: Fluid speech in conversation; appropriate affect; normal thought process Neuro: Sensation intact throughout. No gross coordination deficits.   Ortho Exam -Left shoulder: There is improvement in flexion and external rotation.  Flexion of the shoulder shows no swelling or erythema.  Forward flexion to approximately 140 degrees, abduction to 95 degrees, external rotation to 35 degrees of the left shoulder.  There is still a bony block with external rotation past this.  Negative drop arm test.  No bony tenderness to palpation.  Imaging: No results found.  Past Medical/Family/Surgical/Social History: Medications & Allergies reviewed per EMR, new medications updated. Patient Active Problem List   Diagnosis Date Noted   Symptomatic anemia 05/12/2022   Osteoporosis 01/17/2022   Hypothyroidism 11/13/2020   B12 deficiency 11/13/2020   COPD without exacerbation ([place]) 11/13/2020   Vitamin D deficiency 11/13/2020   Neuropathy 11/13/2020   Rheumatoid arthritis involving  multiple sites ([place]) 11/13/2020   Mixed hyperlipidemia 11/13/2020   Aortic atherosclerosis ([place]) 11/13/2020   GERD (gastroesophageal reflux disease) 11/05/2020   Depression, recurrent ([place]) 09/22/2020   Dehydration 08/13/2020   Hypokalemia 08/13/2020   Abnormal CT of the abdomen 06/02/2020   N&V (nausea and vomiting) 04/08/2020   Monoclonal gammopathy 04/03/2020   Glomerular  disorders in diseases classified elsewhere 03/26/2020   Hx of colonic polyps 05/03/2019   H/O Clostridium difficile infection 05/03/2019   Normocytic anemia 05/03/2019   Chronic diarrhea 01/29/2019   Past Medical History:  Diagnosis Date   Aortic atherosclerosis ([place]) 11/13/2020   CKD (chronic kidney disease) stage 2, GFR 60-89 ml/min    COPD (chronic obstructive pulmonary disease) ([place])    COVID-19 06/2019   Depression    Essential hypertension    GERD (gastroesophageal reflux disease)    History of diabetes mellitus    Hyperlipidemia    Hypothyroidism    Neuropathy    Rheumatoid arthritis (HCC)    Vitamin B12 deficiency    Vitamin D deficiency    Family History  Problem Relation Age of Onset   Stroke Mother    Heart disease Mother    Diabetes Mother    Breast cancer Mother    Other Father        stomach taken out for some reason   Migraines Daughter    Colon cancer Neg Hx    Past Surgical History:  Procedure Laterality Date   BIOPSY  09/16/2019   Procedure: BIOPSY;  Surgeon: Daneil Dolin, MD;  [facility];  Service: Endoscopy;;   BIOPSY  04/28/2020   Procedure: BIOPSY;  Surgeon: Daneil Dolin, MD;  [facility];  Service: Endoscopy;;   BIOPSY  06/08/2020   Procedure: BIOPSY;  Surgeon: Daneil Dolin, MD;  [facility];  Service: Endoscopy;;   CATARACT EXTRACTION Bilateral    COLONOSCOPY  09/2010   Dr. Posey Pronto: mild diverticulsis in sigmoid colon.    COLONOSCOPY WITH PROPOFOL N/A 09/16/2019   Rourk: Diverticulosis, random colon biopsies negative for microscopic colitis.   COLONOSCOPY WITH PROPOFOL N/A 06/08/2020   Procedure: COLONOSCOPY WITH PROPOFOL;  Surgeon: Daneil Dolin, MD;  [facility];  Service: Endoscopy;  Laterality: N/A;  9:15am   ESOPHAGOGASTRODUODENOSCOPY (EGD) WITH PROPOFOL N/A 04/28/2020   Procedure: ESOPHAGOGASTRODUODENOSCOPY (EGD) WITH PROPOFOL;  Surgeon: Daneil Dolin, MD;  [facility];   Service: Endoscopy;  Laterality: N/A;  1:24   YAG LASER APPLICATION Left 58/05/9832   Procedure: YAG LASER APPLICATION;  Surgeon: Williams Che, MD;  [facility];  Service: Ophthalmology;  Laterality: Left;   Social History   Occupational History   Occupation: DISABLED  Tobacco Use   Smoking status: Former    Packs/day: 1.00    Years: 15.00    Total pack years: 15.00    Types: Cigarettes    Quit date: 09/11/2017    Years since quitting: 4.7   Smokeless tobacco: Former  Scientific laboratory technician Use: Never used  Substance and Sexual Activity   Alcohol use: Never   Drug use: Never   Sexual activity: Not Currently

## 2022-06-07 ENCOUNTER — Ambulatory Visit (INDEPENDENT_AMBULATORY_CARE_PROVIDER_SITE_OTHER): Payer: Medicare Other | Admitting: Gastroenterology

## 2022-06-07 ENCOUNTER — Encounter: Payer: Self-pay | Admitting: Gastroenterology

## 2022-06-07 VITALS — BP 118/71 | HR 79 | Temp 97.3°F | Ht 64.0 in | Wt 106.8 lb

## 2022-06-07 DIAGNOSIS — K219 Gastro-esophageal reflux disease without esophagitis: Secondary | ICD-10-CM | POA: Diagnosis not present

## 2022-06-07 DIAGNOSIS — D509 Iron deficiency anemia, unspecified: Secondary | ICD-10-CM | POA: Diagnosis not present

## 2022-06-07 NOTE — Patient Instructions (Signed)
Continue pantoprazole '40mg'$  daily before breakfast. I will follow up on your upcoming labs and then discuss with Dr. Gala Romney. You may need capsule study to look at small bowel due to your recent worsening anemia.

## 2022-06-07 NOTE — Progress Notes (Signed)
GI Office Note    Referring Provider: Loman Brooklyn, FNP Primary Care Physician:  Loman Brooklyn, FNP  Primary Gastroenterologist: Garfield Cornea, MD   Chief Complaint   Chief Complaint  Patient presents with   Follow-up    Doing well.     History of Present Illness   Erica Beasley is a 66 y.o. female presenting today for follow-up.  Last seen in September 2022.  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  History of C. difficile.  Also GERD.  Presents back today for a follow up. She states she is doing a lot better with BMs. Having about 2 formed stools a day. Used imodium once in the past five months. Diarrhea about once per week. No melena, brbpr. No abdominal pain. No n/v. No heartburn. Recently had acute drop in her Hgb from 9.2 in 02/2022 to 7.2 in 04/2022. She received one iron infusion. No recent prbcs. Her iron sat 7%, iron 15, tibc 214, ferritin 678 (in setting of RA). Heme negative X 3 in 04/2022.   She is now living with her daughter. Had a lot of back issues in 09/2021. Finally able to drive again. Not a surgical candidate due to osteoporosis per patient. Continues on Megace. Weight has been stable.  EGD 04/2020:  Normal esophagus. - Normal stomach. - Duodenal erosions. Status post gastric and duodenal biopsy -gastric bx with slight inflammation. No H.pylori.  -duodenal bx benign  Her colonoscopy was updated June 08, 2020 with mild pancolonic diverticulosis normal appearing colonic mucosa otherwise status post segmental biopsies and stool sampling which found no significant histopathological changes and GI path panel negative.  Colonoscopy up-to-date 2020 with diverticulosis and random colon biopsies negative for microscopic colitis and recommended repeat in 5 years (2025).       Medications   Current Outpatient Medications  Medication Sig Dispense Refill   albuterol (VENTOLIN HFA) 108 (90 Base) MCG/ACT inhaler Inhale 2 puffs  into the lungs every 6 (six) hours as needed for wheezing or shortness of breath.     calcium carbonate (OSCAL) 1500 (600 Ca) MG TABS tablet Take by mouth.     citalopram (CELEXA) 40 MG tablet TAKE ONE TABLET ONCE DAILY 30 tablet 5   folic acid (FOLVITE) 1 MG tablet Take 1 tablet (1 mg total) by mouth daily. 90 tablet 3   gabapentin (NEURONTIN) 300 MG capsule Take 1 capsule (300 mg total) by mouth 3 (three) times daily. 270 capsule 1   hydroxychloroquine (PLAQUENIL) 200 MG tablet Take 200 mg by mouth daily.      leflunomide (ARAVA) 20 MG tablet Take 20 mg by mouth daily.     levothyroxine (SYNTHROID) 125 MCG tablet Take 1 tablet (125 mcg total) by mouth every morning. 90 tablet 1   loperamide (IMODIUM A-D) 2 MG tablet Take 1 tablet (2 mg total) by mouth in the morning. Take second dose if needed (Patient taking differently: Take 2 mg by mouth as needed. Take second dose if needed) 30 tablet 0   megestrol (MEGACE) 400 MG/10ML suspension Take 10 mLs (400 mg total) by mouth 2 (two) times daily. 480 mL 2   pantoprazole (PROTONIX) 40 MG tablet TAKE ONE TABLET BY MOUTH DAILY BEFORE BREAKFAST 90 tablet 0   potassium chloride SA (KLOR-CON M) 20 MEQ tablet TAKE ONE TABLET ONCE DAILY, NEED DOCTOR APPT. 30 tablet 6   pravastatin (PRAVACHOL) 40 MG tablet TAKE ONE TABLET AT BEDTIME 30 tablet  3   sulfaDIAZINE 500 MG tablet Take 500 mg by mouth 2 (two) times daily.     traZODone (DESYREL) 100 MG tablet Take 1 tablet (100 mg total) by mouth at bedtime as needed. for sleep 90 tablet 1   No current facility-administered medications for this visit.    Allergies   Allergies as of 06/07/2022 - Review Complete 06/07/2022  Allergen Reaction Noted   Upadacitinib Other (See Comments) 10/04/2021      Review of Systems   General: Negative for anorexia, weight loss, fever, chills, fatigue, weakness. ENT: Negative for hoarseness, difficulty swallowing , nasal congestion. CV: Negative for chest pain, angina,  palpitations, dyspnea on exertion, peripheral edema.  Respiratory: Negative for dyspnea at rest, dyspnea on exertion, cough, sputum, wheezing.  GI: See history of present illness. GU:  Negative for dysuria, hematuria, urinary incontinence, urinary frequency, nocturnal urination.  Endo: Negative for unusual weight change.     Physical Exam   BP 118/71 (BP Location: Left Arm, Patient Position: Sitting, Cuff Size: Normal)   Pulse 79   Temp (!) 97.3 F (36.3 C) (Temporal)   Ht '5\' 4"'$  (1.626 m)   Wt 106 lb 12.8 oz (48.4 kg)   SpO2 95%   BMI 18.33 kg/m    General: thin elderly female in no acute distress.    Eyes: No icterus. Mouth: Oropharyngeal mucosa moist and pink , no lesions erythema or exudate. Abdomen: Bowel sounds are normal, nontender, nondistended, no hepatosplenomegaly or masses,  no abdominal bruits or hernia , no rebound or guarding.  Rectal: not performed  Extremities: No lower extremity edema. No clubbing or deformities. Neuro: Alert and oriented x 4   Skin: Warm and dry, no jaundice.   Psych: Alert and cooperative, normal mood and affect.  Labs   Lab Results  Component Value Date   CREATININE 0.85 04/28/2022   BUN 9 04/28/2022   NA 136 04/28/2022   K 3.8 04/28/2022   CL 108 04/28/2022   CO2 21 (L) 04/28/2022   Lab Results  Component Value Date   ALT 7 04/28/2022   AST 10 (L) 04/28/2022   ALKPHOS 68 04/28/2022   BILITOT 0.4 04/28/2022   Lab Results  Component Value Date   WBC 5.5 05/12/2022   HGB 7.3 (L) 05/12/2022   HCT 25.5 (L) 05/12/2022   MCV 93.8 05/12/2022   PLT 391 05/12/2022   Lab Results  Component Value Date   IRON 16 (L) 05/12/2022   TIBC 234 (L) 05/12/2022   FERRITIN 454 (H) 05/12/2022   Lab Results  Component Value Date   VITAMINB12 221 04/28/2022   Lab Results  Component Value Date   FOLATE 28.3 04/28/2022    Imaging Studies   [place] Extrem Up Left Ltd  Result Date: 05/20/2022 Successful evaluation and injection of the left  glenohumeral joint under ultrasound guidance.  XR Shoulder Left  Result Date: 05/20/2022 4 views of the left shoulder including AP, Grashey, internal rotation and scapular Y views were ordered and reviewed by myself.  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  There is sclerosis noted within the glenoid.    Assessment   Diarrhea: essentially resolved.   GERD:  started on PPI in 03/2020 when she presented with intermittent N/V, weight loss, altered taste/smell post covid. Treated empirically for GERD/gastritis/PUD. EGD with gastritis/duodenitis. Could potentially come off PPI in the future for a trial. Hold off until next  labs in light of recent acute on chronic anemia.   Anemia: acute on chronic normocytic anemia. Bone marrow bx 06/2021 with mildly hypercellular marrow with trilineage hematopoiesis.  No increased ring sideroblasts or blasts.  3% polytypic plasma cells.  No large aggregates of plasma cells.  Iron stores increased.  Chromosome analysis normal. Followed by hematology. Stool hemoccult neg X 3 last month. No overt GI bleeding. Colonoscopy 2021, EGD in 2021. See above. Suspect anemia multifactorial. Iron and iron sats low as well as tibc. Ferritin elevated, likely APR. Received iv iron recently. Follow up on upcoming labs. May require capsule endoscopy.    PLAN   Continue pantoprazole '40mg'$  daily for now. May have trial off in the near future pending anemia work up.  Await upcoming labs, may require capsule endoscopy. Further recommendations to follow.    Laureen Ochs. Bobby Rumpf, [place], [facility]

## 2022-06-09 ENCOUNTER — Encounter: Payer: Self-pay | Admitting: Physical Medicine and Rehabilitation

## 2022-06-17 ENCOUNTER — Inpatient Hospital Stay: Payer: Medicare Other

## 2022-06-17 ENCOUNTER — Inpatient Hospital Stay: Payer: Medicare Other | Attending: Hematology

## 2022-06-17 VITALS — BP 122/80 | HR 76 | Temp 97.8°F | Resp 18

## 2022-06-17 DIAGNOSIS — D649 Anemia, unspecified: Secondary | ICD-10-CM

## 2022-06-17 DIAGNOSIS — D500 Iron deficiency anemia secondary to blood loss (chronic): Secondary | ICD-10-CM

## 2022-06-17 LAB — CBC WITH DIFFERENTIAL/PLATELET
Abs Immature Granulocytes: 0.02 10*3/uL (ref 0.00–0.07)
Basophils Absolute: 0 10*3/uL (ref 0.0–0.1)
Basophils Relative: 0 %
Eosinophils Absolute: 0.2 10*3/uL (ref 0.0–0.5)
Eosinophils Relative: 3 %
HCT: 32.1 % — ABNORMAL LOW (ref 36.0–46.0)
Hemoglobin: 9.1 g/dL — ABNORMAL LOW (ref 12.0–15.0)
Immature Granulocytes: 0 %
Lymphocytes Relative: 31 %
Lymphs Abs: 2.3 10*3/uL (ref 0.7–4.0)
MCH: 27 pg (ref 26.0–34.0)
MCHC: 28.3 g/dL — ABNORMAL LOW (ref 30.0–36.0)
MCV: 95.3 fL (ref 80.0–100.0)
Monocytes Absolute: 0.8 10*3/uL (ref 0.1–1.0)
Monocytes Relative: 10 %
Neutro Abs: 4.2 10*3/uL (ref 1.7–7.7)
Neutrophils Relative %: 56 %
Platelets: 334 10*3/uL (ref 150–400)
RBC: 3.37 MIL/uL — ABNORMAL LOW (ref 3.87–5.11)
RDW: 18.7 % — ABNORMAL HIGH (ref 11.5–15.5)
WBC: 7.5 10*3/uL (ref 4.0–10.5)
nRBC: 0 % (ref 0.0–0.2)

## 2022-06-17 LAB — SAMPLE TO BLOOD BANK

## 2022-06-17 MED ORDER — ACETAMINOPHEN 325 MG PO TABS
650.0000 mg | ORAL_TABLET | Freq: Once | ORAL | Status: AC
  Administered 2022-06-17: 650 mg via ORAL
  Filled 2022-06-17: qty 2

## 2022-06-17 MED ORDER — SODIUM CHLORIDE 0.9 % IV SOLN
510.0000 mg | Freq: Once | INTRAVENOUS | Status: AC
  Administered 2022-06-17: 510 mg via INTRAVENOUS
  Filled 2022-06-17: qty 510

## 2022-06-17 MED ORDER — LORATADINE 10 MG PO TABS
10.0000 mg | ORAL_TABLET | Freq: Once | ORAL | Status: AC
  Administered 2022-06-17: 10 mg via ORAL
  Filled 2022-06-17: qty 1

## 2022-06-17 MED ORDER — SODIUM CHLORIDE 0.9 % IV SOLN: Freq: Once | INTRAVENOUS | Status: AC

## 2022-06-17 NOTE — Patient Instructions (Signed)
[facility]  Discharge Instructions: Thank you for choosing [facility] to provide your oncology and hematology care.  If you have a lab appointment with the [facility], please come in thru the Main Entrance and check in at the main information desk.  Wear comfortable clothing and clothing appropriate for easy access to any Portacath or PICC line.   We strive to give you quality time with your provider. You may need to reschedule your appointment if you arrive late (15 or more minutes).  Arriving late affects you and other patients whose appointments are after yours.  Also, if you miss three or more appointments without notifying the office, you may be dismissed from the clinic at the provider's discretion.      For prescription refill requests, have your pharmacy contact our office and allow 72 hours for refills to be completed.    Today you received the following chemotherapy and/or immunotherapy agents   Feraheme IV iron.    BELOW ARE SYMPTOMS THAT SHOULD BE REPORTED IMMEDIATELY: *FEVER GREATER THAN 100.4 F (38 C) OR HIGHER *CHILLS OR SWEATING *NAUSEA AND VOMITING THAT IS NOT CONTROLLED WITH YOUR NAUSEA MEDICATION *UNUSUAL SHORTNESS OF BREATH *UNUSUAL BRUISING OR BLEEDING *URINARY PROBLEMS (pain or burning when urinating, or frequent urination) *BOWEL PROBLEMS (unusual diarrhea, constipation, pain near the anus) TENDERNESS IN MOUTH AND THROAT WITH OR WITHOUT PRESENCE OF ULCERS (sore throat, sores in mouth, or a toothache) UNUSUAL RASH, SWELLING OR PAIN  UNUSUAL VAGINAL DISCHARGE OR ITCHING   Items with * indicate a potential emergency and should be followed up as soon as possible or go to the Emergency Department if any problems should occur.  Please show the CHEMOTHERAPY ALERT CARD or IMMUNOTHERAPY ALERT CARD at check-in to the Emergency Department and triage nurse.  Should you have questions after your visit or need to cancel or reschedule  your appointment, please contact [facility] [phone]  and follow the prompts.  Office hours are 8:00 a.m. to 4:30 p.m. Monday - Friday. Please note that voicemails left after 4:00 p.m. may not be returned until the following business day.  We are closed weekends and major holidays. You have access to a nurse at all times for urgent questions. Please call the main number to the clinic [phone] and follow the prompts.  For any non-urgent questions, you may also contact your provider using MyChart. We now offer e-Visits for anyone 75 and older to request care online for non-urgent symptoms. For details visit [email or web address].   Also download the MyChart app! Go to the app store, search "MyChart", open the app, select [facility], and log in with your MyChart username and password.  Masks are optional in the cancer centers. If you would like for your care team to wear a mask while they are taking care of you, please let them know. You may have one support person who is at least 65 years old accompany you for your appointments.

## 2022-06-17 NOTE — Progress Notes (Signed)
Pt presents today for Feraheme IV iron infusion per provider's order. Vital signs stable and pt voiced no new complaints at this time.  Peripheral IV started with good blood return pre and post infusion.  Feraheme given today per MD orders. Tolerated infusion without adverse affects. Vital signs stable. No complaints at this time. Discharged from clinic ambulatory in stable condition. Alert and oriented x 3. F/U with [facility] as scheduled. Pt did not wait the 30 minute per policy.

## 2022-06-29 ENCOUNTER — Encounter
Payer: Medicare Other | Attending: Physical Medicine and Rehabilitation | Admitting: Physical Medicine and Rehabilitation

## 2022-06-29 ENCOUNTER — Encounter: Payer: Self-pay | Admitting: Sports Medicine

## 2022-06-29 ENCOUNTER — Encounter: Payer: Self-pay | Admitting: Physical Medicine and Rehabilitation

## 2022-06-29 ENCOUNTER — Ambulatory Visit (INDEPENDENT_AMBULATORY_CARE_PROVIDER_SITE_OTHER): Payer: Medicare Other | Admitting: Sports Medicine

## 2022-06-29 ENCOUNTER — Other Ambulatory Visit: Payer: Self-pay | Admitting: Family Medicine

## 2022-06-29 VITALS — BP 117/73 | HR 88 | Ht 64.0 in | Wt 110.0 lb

## 2022-06-29 DIAGNOSIS — M7502 Adhesive capsulitis of left shoulder: Secondary | ICD-10-CM | POA: Diagnosis not present

## 2022-06-29 DIAGNOSIS — M545 Low back pain, unspecified: Secondary | ICD-10-CM

## 2022-06-29 DIAGNOSIS — M791 Myalgia, unspecified site: Secondary | ICD-10-CM | POA: Diagnosis not present

## 2022-06-29 DIAGNOSIS — G8929 Other chronic pain: Secondary | ICD-10-CM | POA: Insufficient documentation

## 2022-06-29 DIAGNOSIS — M533 Sacrococcygeal disorders, not elsewhere classified: Secondary | ICD-10-CM

## 2022-06-29 DIAGNOSIS — R269 Unspecified abnormalities of gait and mobility: Secondary | ICD-10-CM

## 2022-06-29 DIAGNOSIS — K219 Gastro-esophageal reflux disease without esophagitis: Secondary | ICD-10-CM

## 2022-06-29 MED ORDER — DICLOFENAC SODIUM 1 % EX GEL: 4.0000 g | Freq: Four times a day (QID) | CUTANEOUS | 0 refills | Status: DC

## 2022-06-29 NOTE — Assessment & Plan Note (Addendum)
Schedule R SI joint steroid injection with Dr. Letta Pate.  In the interim, use voltaren gel on your hips and low back up to 3x daily for arthritic pain control.  Patient has not found opiate medications helpful in the past.   Follow up 2 weeks after injection to assess results and discuss starting PT to work on posture, gait, and back stretching. We can also discuss aquatic therapy if land therapy is too difficult a that time.

## 2022-06-29 NOTE — Assessment & Plan Note (Signed)
Re-evaluate after injection and likely PT referral next visit, as above.

## 2022-06-29 NOTE — Progress Notes (Signed)
Erica Beasley - 66 y.o. female MRN [id number]  Date of birth: 1956-05-29  Office Visit Note: Visit Date: 06/29/2022 PCP: Loman Brooklyn, FNP Referred by: Loman Brooklyn, FNP  Subjective: No chief complaint on file.  HPI: Erica Beasley is a pleasant 66 y.o. female who presents today for follow-up of left shoulder pain and adhesive capsulitis.  She continues to work on range of motion home exercises and feels like she has had much less pain as well as improve range of motion with this.  Sometimes she will get some pain in the muscle belly of the deltoid but the shoulder feels much improved.  She is now able to sleep on that shoulder.  Denies any new injury.  Occasionally takes Tylenol.  Pertinent ROS were reviewed with the patient and found to be negative unless otherwise specified above in HPI.   Assessment & Plan: Visit Diagnoses:  1. Adhesive capsulitis of left shoulder   2. Muscle pain    Plan: Discussed with Erica Beasley I am pleased that her pain is much improved and her range of motion is slowly improving with the home exercise program.  Encouraged her to continue doing this every day in order to stretch out the capsule and preserve and recover her range of motion.  She does have some muscle tenderness upon deep palpation of the deltoid.  Recommended some topical Voltaren gel and applying a heating pad to this twice a day.  At this point we will leave follow-up on an as-needed basis.  Follow-up: PRN   Meds & Orders:  Meds ordered this encounter  Medications   diclofenac Sodium (VOLTAREN) 1 % GEL    Sig: Apply 4 g topically 4 (four) times daily.    Dispense:  4 g    Refill:  0   No orders of the defined types were placed in this encounter.    Procedures: No procedures performed      Clinical History: No specialty comments available.  She reports that she quit smoking about 4 years ago. Her smoking use included cigarettes. She has a 15.00 pack-year smoking history. She has  quit using smokeless tobacco.  Recent Labs    08/06/21 1431 01/28/22 1158  HGBA1C 5.9* 4.8    Objective:   Vital Signs: There were no vitals taken for this visit.  Physical Exam  Gen: Well-appearing, in no acute distress; non-toxic CV: Regular Rate. Well-perfused. Warm.  Resp: Breathing unlabored on room air; no wheezing. Psych: Fluid speech in conversation; appropriate affect; normal thought process Neuro: Sensation intact throughout. No gross coordination deficits.   Ortho Exam - Left shoulder: No bony tenderness, there are some mild TTP within the mid belly of the deltoid muscle, likely where she had had her previous shingles vaccination.  Forward flexion of the shoulder to approximately 145 degrees, abduction to 105 degrees which is improved from last visit.  Still has some restriction with both passive and active range of motion compared to the contralateral arm but improved.  Rotator cuff testing appears intact.  Neurovascular intact distally.  Imaging:  XR Shoulder Left - from 05/20/22 4 views of the left shoulder including AP, Grashey, internal rotation and  scapular Y views were ordered and reviewed by myself.  X-rays demonstrate  severe bone-on-bone arthritis of the glenohumeral joint as well as a large  osteophyte off the inferior humeral head.  There is sclerosis noted within  the glenoid.     Past Medical/Family/Surgical/Social History: Medications & Allergies  reviewed per EMR, new medications updated. Patient Active Problem List   Diagnosis Date Noted   IDA (iron deficiency anemia) 06/07/2022   Symptomatic anemia 05/12/2022   Osteoporosis 01/17/2022   Hypothyroidism 11/13/2020   B12 deficiency 11/13/2020   COPD without exacerbation ([place]) 11/13/2020   Vitamin D deficiency 11/13/2020   Neuropathy 11/13/2020   Rheumatoid arthritis involving multiple sites ([place]) 11/13/2020   Mixed hyperlipidemia 11/13/2020   Aortic atherosclerosis ([place]) 11/13/2020   GERD  (gastroesophageal reflux disease) 11/05/2020   Depression, recurrent ([place]) 09/22/2020   Dehydration 08/13/2020   Hypokalemia 08/13/2020   Abnormal CT of the abdomen 06/02/2020   N&V (nausea and vomiting) 04/08/2020   Monoclonal gammopathy 04/03/2020   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 03/26/2020   Hx of colonic polyps 05/03/2019   H/O Clostridium difficile infection 05/03/2019   Normocytic anemia 05/03/2019   Chronic diarrhea 01/29/2019   Past Medical History:  Diagnosis Date   Aortic atherosclerosis ([place]) 11/13/2020   CKD (chronic kidney disease) stage 2, GFR 60-89 ml/min    COPD (chronic obstructive pulmonary disease) ([place])    COVID-19 06/2019   Depression    Essential hypertension    GERD (gastroesophageal reflux disease)    History of diabetes mellitus    Hyperlipidemia    Hypothyroidism    Neuropathy    Rheumatoid arthritis (H[facility])    Vitamin B12 deficiency    Vitamin D deficiency    Family History  Problem Relation Age of Onset   Stroke Mother    Heart disease Mother    Diabetes Mother    Breast cancer Mother    Other Father        stomach taken out for some reason   Migraines Daughter    Colon cancer Neg Hx    Past Surgical History:  Procedure Laterality Date   BIOPSY  09/16/2019   Procedure: BIOPSY;  Surgeon: Daneil Dolin, MD;  Location: [facility];  Service: Endoscopy;;   BIOPSY  04/28/2020   Procedure: BIOPSY;  Surgeon: Daneil Dolin, MD;  [facility];  Service: Endoscopy;;   BIOPSY  06/08/2020   Procedure: BIOPSY;  Surgeon: Daneil Dolin, MD;  [facility]: [facility];  Service: Endoscopy;;   CATARACT EXTRACTION Bilateral    COLONOSCOPY  09/2010   Dr. Posey Pronto: mild diverticulsis in sigmoid colon.    COLONOSCOPY WITH PROPOFOL N/A 09/16/2019   Rourk: Diverticulosis, random colon biopsies negative for microscopic colitis.   COLONOSCOPY WITH PROPOFOL N/A 06/08/2020   Procedure: COLONOSCOPY WITH PROPOFOL;  Surgeon: Daneil Dolin, MD;  [facility];  Service: Endoscopy;  Laterality: N/A;  9:15am   ESOPHAGOGASTRODUODENOSCOPY (EGD) WITH PROPOFOL N/A 04/28/2020   Procedure: ESOPHAGOGASTRODUODENOSCOPY (EGD) WITH PROPOFOL;  Surgeon: Daneil Dolin, MD;  [facility];  Service: Endoscopy;  Laterality: N/A;  9:38   YAG LASER APPLICATION Left 07/12/5101   Procedure: YAG LASER APPLICATION;  Surgeon: Williams Che, MD;  Location: [facility];  Service: Ophthalmology;  Laterality: Left;   Social History   Occupational History   Occupation: DISABLED  Tobacco Use   Smoking status: Former    Packs/day: 1.00    Years: 15.00    Total pack years: 15.00    Types: Cigarettes    Quit date: 09/11/2017    Years since quitting: 4.8   Smokeless tobacco: Former  Scientific laboratory technician Use: Never used  Substance and Sexual Activity   Alcohol use: Never   Drug  use: Never   Sexual activity: Not Currently

## 2022-06-29 NOTE — Progress Notes (Signed)
States the shoulder is doing better since last visit. Doesn't think the injection did much; but states it is better

## 2022-06-29 NOTE — Progress Notes (Signed)
Subjective:    Patient ID: Erica Beasley, female    DOB: 1956/06/23, 66 y.o.   MRN: [id number]  HPI CHELLIE Beasley is a 66 y.o. year old female  who  has a past medical history of Aortic atherosclerosis ([place]) (11/13/2020), CKD (chronic kidney disease) stage 2, GFR 60-89 ml/min, COPD (chronic obstructive pulmonary disease) ([place]), COVID-19 (06/2019), Depression, Essential hypertension, GERD (gastroesophageal reflux disease), History of diabetes mellitus, Hyperlipidemia, Hypothyroidism, Neuropathy, Rheumatoid arthritis ([place]), Vitamin B12 deficiency, and Vitamin D deficiency.   They are presenting to PM&R clinic as a new patient for pain management evaluation. They were referred by Dr. Basil Dess for treatment of chronic back pain.   Source: Low back, bilateral, not extending into legs. Also endorses neck pain when sitting for long periods.  Inciting incident: Pt got off a boat and "messed up my back" in July 2021. She denies falling, twisting, or any abnormal movements at the time.   Duration of pain: Only when she is standing or walking.  Description of pain: "it just aches, I don't know" Severity: On average 8/10. At worst 10/10. At best 0/10 - only when laying down. Exacerbating factors: Walking, standing. Remitting factors: Laying down.  Red flag symptoms: Patient denies saddle anesthesia, loss of bowel or bladder continence, new weakness, new numbness/tingling, or pain waking up at nighttime.  Medications tried: Topical medications ( unsure of effect) : Uses voltaren for her shoulder; can't reach her back to try it. Nsaids ( x effect): took 2 ibuprofen per say for her shoulder; did not effect back.  Tylenol  ( no effect): 2 tabs BID tried with no effect.  Opiates  ( no effect): Hydrocodone did not effect it.  Gabapentin / Lyrica  ( no effect): takes 900 mg daily for neuropathy; no effect on back.  TCAs : never tried SNRIs : never tried; on celexa Other  ( no effect): Robaxin did not  effect it.   Other treatments: PT/OT  (no effect): Went to PT for her low back for 3-4 sessions, earlier this year.  Accupuncture/chiropractor/massage  ( no effect): Went to chiropractor for "a long, long time" but felt it wasn't benefiting.  TENs unit ( no effect): tried with PT, no effect.  Injections (some effect): 3x ESI with Dr Magnus Sinning most recently 09/2021 R L4 transforaminal, first two helped and last one hurt a lot during the injection, she states she didn't tolerate it well.  Of note, 1st and 3rd injections were the same, second was R L4-5 interlaminar.   Surgery : Was told she cannot have surgery d/t chronic anemia.  Other: none  Goals for pain control: Pt would like to be able to sweep the floor or cook without having to sit down.   She used to use a walker, but no longer uses AD. She lives with her daughter in a [place], daughter helps patient with her ADLs.   Pain Inventory Average Pain 5 Pain Right Now 5 My pain is intermittent and aching  In the last 24 hours, has pain interfered with the following? General activity 8 Relation with others 5 Enjoyment of life 8 What TIME of day is your pain at its worst? varies Sleep (in general) Good  Pain is worse with: walking, bending, and standing Pain improves with: rest Relief from Meds:  none  walk without assistance how many minutes can you walk? 5 mins ability to climb steps?  no do you drive?  yes Do you have  any goals in this area?  yes  retired I need assistance with the following:  household duties Do you have any goals in this area?  yes  trouble walking depression anxiety loss of taste or smell  Any changes since last visit?  no  Any changes since last visit?  no    Family History  Problem Relation Age of Onset   Stroke Mother    Heart disease Mother    Diabetes Mother    Breast cancer Mother    Other Father        stomach taken out for some reason   Migraines Daughter    Colon  cancer Neg Hx    Social History   Socioeconomic History   Marital status: Widowed    Spouse name: Not on file   Number of children: 1   Years of education: Not on file   Highest education level: Not on file  Occupational History   Occupation: DISABLED  Tobacco Use   Smoking status: Former    Packs/day: 1.00    Years: 15.00    Total pack years: 15.00    Types: Cigarettes    Quit date: 09/11/2017    Years since quitting: 4.8   Smokeless tobacco: Former  Scientific laboratory technician Use: Never used  Substance and Sexual Activity   Alcohol use: Never   Drug use: Never   Sexual activity: Not Currently  Other Topics Concern   Not on file  Social History Narrative   Not on file   Social Determinants of Health   Financial Resource Strain: Low Risk  (09/03/2020)   Overall Financial Resource Strain (CARDIA)    Difficulty of Paying Living Expenses: Not hard at all  Food Insecurity: No Food Insecurity (09/03/2020)   Hunger Vital Sign    Worried About Running Out of Food in the Last Year: Never true    [place] in the Last Year: Never true  Transportation Needs: No Transportation Needs (09/03/2020)   PRAPARE - Hydrologist (Medical): No    Lack of Transportation (Non-Medical): No  Physical Activity: Insufficiently Active (09/03/2020)   Exercise Vital Sign    Days of Exercise per Week: 3 days    Minutes of Exercise per Session: 30 min  Stress: No Stress Concern Present (09/03/2020)   [place]    Feeling of Stress : Only a little  Social Connections: Socially Isolated (09/03/2020)   Social Connection and Isolation Panel [NHANES]    Frequency of Communication with Friends and Family: More than three times a week    Frequency of Social Gatherings with Friends and Family: More than three times a week    Attends Religious Services: Never    Marine scientist or Organizations: No     Attends Archivist Meetings: Never    Marital Status: Widowed   Past Surgical History:  Procedure Laterality Date   BIOPSY  09/16/2019   Procedure: BIOPSY;  Surgeon: Daneil Dolin, MD;  [facility];  Service: Endoscopy;;   BIOPSY  04/28/2020   Procedure: BIOPSY;  Surgeon: Daneil Dolin, MD;  [facility];  Service: Endoscopy;;   BIOPSY  06/08/2020   Procedure: BIOPSY;  Surgeon: Daneil Dolin, MD;  [facility];  Service: Endoscopy;;   CATARACT EXTRACTION Bilateral    COLONOSCOPY  09/2010   Dr. Posey Pronto: mild diverticulsis in  sigmoid colon.    COLONOSCOPY WITH PROPOFOL N/A 09/16/2019   Rourk: Diverticulosis, random colon biopsies negative for microscopic colitis.   COLONOSCOPY WITH PROPOFOL N/A 06/08/2020   Procedure: COLONOSCOPY WITH PROPOFOL;  Surgeon: Daneil Dolin, MD;  [facility];  Service: Endoscopy;  Laterality: N/A;  9:15am   ESOPHAGOGASTRODUODENOSCOPY (EGD) WITH PROPOFOL N/A 04/28/2020   Procedure: ESOPHAGOGASTRODUODENOSCOPY (EGD) WITH PROPOFOL;  Surgeon: Daneil Dolin, MD;  [facility];  Service: Endoscopy;  Laterality: N/A;  2:09   YAG LASER APPLICATION Left 47/05/6282   Procedure: YAG LASER APPLICATION;  Surgeon: Williams Che, MD;  Location: [facility];  Service: Ophthalmology;  Laterality: Left;   Past Medical History:  Diagnosis Date   Aortic atherosclerosis ([place]) 11/13/2020   CKD (chronic kidney disease) stage 2, GFR 60-89 ml/min    COPD (chronic obstructive pulmonary disease) ([place])    COVID-19 06/2019   Depression    Essential hypertension    GERD (gastroesophageal reflux disease)    History of diabetes mellitus    Hyperlipidemia    Hypothyroidism    Neuropathy    Rheumatoid arthritis ([facility])    Vitamin B12 deficiency    Vitamin D deficiency    BP 117/73   Pulse 88   Ht '5\' 4"'$  (1.626 m)   Wt 110 lb (49.9 kg)   SpO2 95%   BMI 18.88 kg/m   Opioid Risk Score:   Fall Risk Score:   `1  Depression screen [facility] 2/9     06/29/2022   12:48 PM 05/24/2022    2:04 PM 04/20/2022   10:50 AM 08/06/2021    1:45 PM 06/03/2021    4:29 PM 04/06/2021    9:36 AM 02/16/2021    9:11 AM  Depression screen PHQ 2/9  Decreased Interest 3 0 0 0 0 0 0  Down, Depressed, Hopeless 0 0 0 0  0 0  PHQ - 2 Score 3 0 0 0 0 0 0  Altered sleeping 0 0 0 0 0 0 0  Tired, decreased energy 3 0 0 0 3 0 3  Change in appetite 0 0 0 0 2 0 0  Feeling bad or failure about yourself  0 0 0 0 0 0 0  Trouble concentrating 0 0 0 0 0 0 0  Moving slowly or fidgety/restless 0 0 0 0 0 0 0  Suicidal thoughts 0 0 0 0 0 0 0  PHQ-9 Score 6 0 0 0 5 0 3  Difficult doing work/chores  Not difficult at all Not difficult at all Not difficult at all Not difficult at all Not difficult at all Not difficult at all    Review of Systems  Musculoskeletal:  Positive for back pain and gait problem.       Pain in both hips, left shoulder pain  Psychiatric/Behavioral:         Anxiety, depression  All other systems reviewed and are negative.      Objective:   Physical Exam  Constitutional: No apparent distress.  HENT: No JVD. Neck Supple. Trachea midline. Atraumatic, normocephalic. Eyes: PERRLA. +glasses Cardiovascular: RRR, no murmurs/rub/gallops. No Edema. Peripheral pulses 2+  Respiratory: CTAB. No rales, rhonchi, or wheezing. On RA.  Abdomen: + bowel sounds, normoactive. No distention or tenderness.  Skin: C/D/I. No apparent lesions. MSK:      No apparent deformity.      Strength: 4+/5 throughout bilateral UE and Les; 4- bilateral hip flexors limited by pain in back.  Neurologic exam:  Cognition: AAO to person, place, time. Some mild repetition of phrases, but appropriate responses to all questions, follows all commands.  Mood: Pleasant affect, appropriate mood. +Tic with puffing out L cheek continuously.  Sensation: To light touch intact in BL UEs and LEs  Reflexes: 2+ in BL UE and LEs. Negative Hoffman's and babinski  signs bilaterally. Coordination: No apparent tremors. No ataxia on FTN, HTS bilaterally. Difficulty with fine motor in BL fingers.  Spasticity: MAS 0 in all extremities.  Gait: Forward-leaning, analgic gait favoring L leg.   Back Exam:   Inspection: Pelvis was equal .  Lumbar lordotic curvature was  present, normal.  Mild dextroscoliosis.  Palpation: Palpatory exam revealed ttp at the bilateral SI joints and greater trochanters. No TTP axial spine, lumbar paraspinals, PSIS, or ischial tuberosities. There was no evidence of spasm. No trigger points were noted.     ROM revealed restricted ROM in forward flexion > back extension.  Special/provocative testing:    SLR: Negative   Slump test: Negative   Facet loading: negative   TTP at paraspinals: none   Corky Sox test: + bilaterally   FAIR test: + L   Gaenslen test: + R   Yeoman's test: + R     Thomas Test: Mildly + bilaterally     Assessment & Plan:

## 2022-06-29 NOTE — Assessment & Plan Note (Signed)
MRI Lumbar spine 12/17/21 independently reviewed. Results include "chronic discogenic edematous changes at L3-4 and L4-5", dextroscoliosis with apex at L3, L4-5 protrusion of the disc with bilateral neuroforaminal stenosis, and mild L2-3 spondylosis.  Has tried ESI with Dr. Magnus Sinning in the past with some success; 09/2021 R L4 transforaminal injection unfortunately was painful for the patient and she did not continue.  Can consider return to [facility] intelaminar or caudal injections in the future, as well as neuropathic pain medications, however at this point pain etiology appears non-radicular and mostly localized to R>L SI joints.

## 2022-06-29 NOTE — Patient Instructions (Signed)
Please schedule an appointment with Dr. Letta Pate for right SI joint steroid injection.  In the interim, use voltaren gel on your hips and low back up to 3x daily for arthritic pain control.  Follow up 2 weeks after injection to assess results and discuss starting PT to work on posture, gait, and back stretching. We can also discuss aquatic therapy if land therapy is too difficult.

## 2022-07-08 ENCOUNTER — Observation Stay (HOSPITAL_COMMUNITY): Payer: Medicare Other

## 2022-07-08 ENCOUNTER — Other Ambulatory Visit (HOSPITAL_COMMUNITY): Payer: Medicare Other

## 2022-07-08 ENCOUNTER — Other Ambulatory Visit (HOSPITAL_COMMUNITY): Payer: Self-pay

## 2022-07-08 ENCOUNTER — Other Ambulatory Visit: Payer: Self-pay

## 2022-07-08 ENCOUNTER — Inpatient Hospital Stay (HOSPITAL_COMMUNITY)
Admission: EM | Admit: 2022-07-08 | Discharge: 2022-07-10 | DRG: 066 | Disposition: A | Discharge status: Home / Self Care | Payer: Medicare Other | Attending: Family Medicine | Admitting: Family Medicine

## 2022-07-08 ENCOUNTER — Emergency Department (HOSPITAL_COMMUNITY): Payer: Medicare Other

## 2022-07-08 ENCOUNTER — Encounter (HOSPITAL_COMMUNITY): Payer: Self-pay | Admitting: *Deleted

## 2022-07-08 DIAGNOSIS — R4701 Aphasia: Secondary | ICD-10-CM | POA: Diagnosis present

## 2022-07-08 DIAGNOSIS — E1122 Type 2 diabetes mellitus with diabetic chronic kidney disease: Secondary | ICD-10-CM | POA: Diagnosis present

## 2022-07-08 DIAGNOSIS — D509 Iron deficiency anemia, unspecified: Secondary | ICD-10-CM | POA: Diagnosis present

## 2022-07-08 DIAGNOSIS — Z79899 Other long term (current) drug therapy: Secondary | ICD-10-CM | POA: Diagnosis not present

## 2022-07-08 DIAGNOSIS — Z833 Family history of diabetes mellitus: Secondary | ICD-10-CM

## 2022-07-08 DIAGNOSIS — Z87891 Personal history of nicotine dependence: Secondary | ICD-10-CM | POA: Diagnosis not present

## 2022-07-08 DIAGNOSIS — Z8616 Personal history of COVID-19: Secondary | ICD-10-CM

## 2022-07-08 DIAGNOSIS — K219 Gastro-esophageal reflux disease without esophagitis: Secondary | ICD-10-CM | POA: Diagnosis not present

## 2022-07-08 DIAGNOSIS — R29810 Facial weakness: Secondary | ICD-10-CM | POA: Diagnosis not present

## 2022-07-08 DIAGNOSIS — E782 Mixed hyperlipidemia: Secondary | ICD-10-CM | POA: Diagnosis not present

## 2022-07-08 DIAGNOSIS — R29702 NIHSS score 2: Secondary | ICD-10-CM | POA: Diagnosis present

## 2022-07-08 DIAGNOSIS — F32A Depression, unspecified: Secondary | ICD-10-CM | POA: Diagnosis present

## 2022-07-08 DIAGNOSIS — E039 Hypothyroidism, unspecified: Secondary | ICD-10-CM | POA: Diagnosis present

## 2022-07-08 DIAGNOSIS — I639 Cerebral infarction, unspecified: Secondary | ICD-10-CM

## 2022-07-08 DIAGNOSIS — K529 Noninfective gastroenteritis and colitis, unspecified: Secondary | ICD-10-CM | POA: Diagnosis present

## 2022-07-08 DIAGNOSIS — F339 Major depressive disorder, recurrent, unspecified: Secondary | ICD-10-CM | POA: Diagnosis not present

## 2022-07-08 DIAGNOSIS — Z803 Family history of malignant neoplasm of breast: Secondary | ICD-10-CM

## 2022-07-08 DIAGNOSIS — R471 Dysarthria and anarthria: Secondary | ICD-10-CM | POA: Diagnosis not present

## 2022-07-08 DIAGNOSIS — M069 Rheumatoid arthritis, unspecified: Secondary | ICD-10-CM | POA: Diagnosis present

## 2022-07-08 DIAGNOSIS — J449 Chronic obstructive pulmonary disease, unspecified: Secondary | ICD-10-CM | POA: Diagnosis not present

## 2022-07-08 DIAGNOSIS — Z23 Encounter for immunization: Secondary | ICD-10-CM | POA: Diagnosis not present

## 2022-07-08 DIAGNOSIS — N182 Chronic kidney disease, stage 2 (mild): Secondary | ICD-10-CM | POA: Diagnosis not present

## 2022-07-08 DIAGNOSIS — E1140 Type 2 diabetes mellitus with diabetic neuropathy, unspecified: Secondary | ICD-10-CM | POA: Diagnosis present

## 2022-07-08 DIAGNOSIS — M810 Age-related osteoporosis without current pathological fracture: Secondary | ICD-10-CM | POA: Diagnosis not present

## 2022-07-08 DIAGNOSIS — Z8249 Family history of ischemic heart disease and other diseases of the circulatory system: Secondary | ICD-10-CM

## 2022-07-08 DIAGNOSIS — R4781 Slurred speech: Secondary | ICD-10-CM | POA: Diagnosis not present

## 2022-07-08 DIAGNOSIS — Z79890 Hormone replacement therapy (postmenopausal): Secondary | ICD-10-CM

## 2022-07-08 DIAGNOSIS — I6389 Other cerebral infarction: Principal | ICD-10-CM | POA: Diagnosis present

## 2022-07-08 DIAGNOSIS — M0579 Rheumatoid arthritis with rheumatoid factor of multiple sites without organ or systems involvement: Secondary | ICD-10-CM

## 2022-07-08 DIAGNOSIS — Z888 Allergy status to other drugs, medicaments and biological substances status: Secondary | ICD-10-CM

## 2022-07-08 DIAGNOSIS — Z823 Family history of stroke: Secondary | ICD-10-CM

## 2022-07-08 DIAGNOSIS — R479 Unspecified speech disturbances: Secondary | ICD-10-CM | POA: Diagnosis not present

## 2022-07-08 DIAGNOSIS — I771 Stricture of artery: Secondary | ICD-10-CM | POA: Diagnosis not present

## 2022-07-08 DIAGNOSIS — I6523 Occlusion and stenosis of bilateral carotid arteries: Secondary | ICD-10-CM | POA: Diagnosis not present

## 2022-07-08 DIAGNOSIS — I129 Hypertensive chronic kidney disease with stage 1 through stage 4 chronic kidney disease, or unspecified chronic kidney disease: Secondary | ICD-10-CM | POA: Diagnosis not present

## 2022-07-08 HISTORY — DX: Cerebral infarction, unspecified: I63.9

## 2022-07-08 LAB — DIFFERENTIAL
Abs Immature Granulocytes: 0.03 10*3/uL (ref 0.00–0.07)
Basophils Absolute: 0 10*3/uL (ref 0.0–0.1)
Basophils Relative: 0 %
Eosinophils Absolute: 0.1 10*3/uL (ref 0.0–0.5)
Eosinophils Relative: 2 %
Immature Granulocytes: 0 %
Lymphocytes Relative: 21 %
Lymphs Abs: 1.5 10*3/uL (ref 0.7–4.0)
Monocytes Absolute: 0.7 10*3/uL (ref 0.1–1.0)
Monocytes Relative: 10 %
Neutro Abs: 4.8 10*3/uL (ref 1.7–7.7)
Neutrophils Relative %: 67 %

## 2022-07-08 LAB — PROTIME-INR
INR: 1.2 (ref 0.8–1.2)
INR: 1.2 (ref 0.8–1.2)
Prothrombin Time: 15 seconds (ref 11.4–15.2)
Prothrombin Time: 15.1 seconds (ref 11.4–15.2)

## 2022-07-08 LAB — CBC
HCT: 29.3 % — ABNORMAL LOW (ref 36.0–46.0)
Hemoglobin: 8.4 g/dL — ABNORMAL LOW (ref 12.0–15.0)
MCH: 26.7 pg (ref 26.0–34.0)
MCHC: 28.7 g/dL — ABNORMAL LOW (ref 30.0–36.0)
MCV: 93 fL (ref 80.0–100.0)
Platelets: 347 10*3/uL (ref 150–400)
RBC: 3.15 MIL/uL — ABNORMAL LOW (ref 3.87–5.11)
RDW: 16 % — ABNORMAL HIGH (ref 11.5–15.5)
WBC: 7.2 10*3/uL (ref 4.0–10.5)
nRBC: 0 % (ref 0.0–0.2)

## 2022-07-08 LAB — COMPREHENSIVE METABOLIC PANEL
ALT: 7 U/L (ref 0–44)
AST: 11 U/L — ABNORMAL LOW (ref 15–41)
Albumin: 3 g/dL — ABNORMAL LOW (ref 3.5–5.0)
Alkaline Phosphatase: 69 U/L (ref 38–126)
Anion gap: 10 (ref 5–15)
BUN: 10 mg/dL (ref 8–23)
CO2: 20 mmol/L — ABNORMAL LOW (ref 22–32)
Calcium: 8.5 mg/dL — ABNORMAL LOW (ref 8.9–10.3)
Chloride: 109 mmol/L (ref 98–111)
Creatinine, Ser: 0.89 mg/dL (ref 0.44–1.00)
GFR, Estimated: >60 mL/min (ref >60)
Glucose, Bld: 156 mg/dL — ABNORMAL HIGH (ref 70–99)
Potassium: 3.8 mmol/L (ref 3.5–5.1)
Sodium: 139 mmol/L (ref 135–145)
Total Bilirubin: 0.4 mg/dL (ref 0.3–1.2)
Total Protein: 6.4 g/dL — ABNORMAL LOW (ref 6.5–8.1)

## 2022-07-08 LAB — I-STAT CHEM 8, ED
BUN: 7 mg/dL — ABNORMAL LOW (ref 8–23)
Calcium, Ion: 0.97 mmol/L — ABNORMAL LOW (ref 1.15–1.40)
Chloride: 108 mmol/L (ref 98–111)
Creatinine, Ser: 0.9 mg/dL (ref 0.44–1.00)
Glucose, Bld: 152 mg/dL — ABNORMAL HIGH (ref 70–99)
HCT: 27 % — ABNORMAL LOW (ref 36.0–46.0)
Hemoglobin: 9.2 g/dL — ABNORMAL LOW (ref 12.0–15.0)
Potassium: 3.8 mmol/L (ref 3.5–5.1)
Sodium: 139 mmol/L (ref 135–145)
TCO2: 21 mmol/L — ABNORMAL LOW (ref 22–32)

## 2022-07-08 LAB — RAPID URINE DRUG SCREEN, HOSP PERFORMED
Amphetamines: NOT DETECTED
Barbiturates: NOT DETECTED
Benzodiazepines: NOT DETECTED
Cocaine: NOT DETECTED
Opiates: NOT DETECTED
Tetrahydrocannabinol: NOT DETECTED

## 2022-07-08 LAB — CBG MONITORING, ED: Glucose-Capillary: 124 mg/dL — ABNORMAL HIGH (ref 70–99)

## 2022-07-08 LAB — HEMOGLOBIN A1C
Hgb A1c MFr Bld: 4.6 % — ABNORMAL LOW (ref 4.8–5.6)
Mean Plasma Glucose: 85.32 mg/dL

## 2022-07-08 LAB — TSH: TSH: 21.462 u[IU]/mL — ABNORMAL HIGH (ref 0.350–4.500)

## 2022-07-08 LAB — LIPID PANEL
Cholesterol: 136 mg/dL (ref 0–200)
HDL: 36 mg/dL — ABNORMAL LOW (ref >40)
LDL Cholesterol: 80 mg/dL (ref 0–99)
Total CHOL/HDL Ratio: 3.8 RATIO
Triglycerides: 100 mg/dL (ref <150)
VLDL: 20 mg/dL (ref 0–40)

## 2022-07-08 LAB — MAGNESIUM: Magnesium: 1.9 mg/dL (ref 1.7–2.4)

## 2022-07-08 LAB — HIV ANTIBODY (ROUTINE TESTING W REFLEX): HIV Screen 4th Generation wRfx: NONREACTIVE

## 2022-07-08 LAB — ETHANOL: Alcohol, Ethyl (B): <10 mg/dL (ref <10)

## 2022-07-08 LAB — PHOSPHORUS: Phosphorus: 3.4 mg/dL (ref 2.5–4.6)

## 2022-07-08 LAB — APTT: aPTT: 32 seconds (ref 24–36)

## 2022-07-08 LAB — BRAIN NATRIURETIC PEPTIDE: B Natriuretic Peptide: 28 pg/mL (ref 0.0–100.0)

## 2022-07-08 MED ORDER — SODIUM CHLORIDE 0.9 % IV SOLN: 100.0000 mL/h | INTRAVENOUS | Status: DC

## 2022-07-08 MED ORDER — SODIUM CHLORIDE 0.9 % IV SOLN: INTRAVENOUS | Status: AC

## 2022-07-08 MED ORDER — ATORVASTATIN CALCIUM 40 MG PO TABS
80.0000 mg | ORAL_TABLET | Freq: Every day | ORAL | Status: DC
  Administered 2022-07-08 – 2022-07-09 (×2): 80 mg via ORAL
  Filled 2022-07-08 (×2): qty 2

## 2022-07-08 MED ORDER — OXYCODONE HCL 5 MG PO TABS: 5.0000 mg | ORAL_TABLET | ORAL | Status: DC | PRN

## 2022-07-08 MED ORDER — FLEET ENEMA 7-19 GM/118ML RE ENEM: 1.0000 | ENEMA | Freq: Once | RECTAL | Status: DC | PRN

## 2022-07-08 MED ORDER — SENNOSIDES-DOCUSATE SODIUM 8.6-50 MG PO TABS: 1.0000 | ORAL_TABLET | Freq: Every evening | ORAL | Status: DC | PRN

## 2022-07-08 MED ORDER — SODIUM CHLORIDE 0.9 % IV SOLN: 250.0000 mL | INTRAVENOUS | Status: DC | PRN

## 2022-07-08 MED ORDER — TRAZODONE HCL 50 MG PO TABS: 25.0000 mg | ORAL_TABLET | Freq: Every evening | ORAL | Status: DC | PRN

## 2022-07-08 MED ORDER — CLOPIDOGREL BISULFATE 75 MG PO TABS
300.0000 mg | ORAL_TABLET | Freq: Once | ORAL | Status: AC
  Administered 2022-07-08: 300 mg via ORAL
  Filled 2022-07-08: qty 4

## 2022-07-08 MED ORDER — ASPIRIN 81 MG PO TBEC
81.0000 mg | DELAYED_RELEASE_TABLET | Freq: Every day | ORAL | Status: DC
  Administered 2022-07-08 – 2022-07-10 (×3): 81 mg via ORAL
  Filled 2022-07-08 (×3): qty 1

## 2022-07-08 MED ORDER — LEVOTHYROXINE SODIUM 25 MCG PO TABS
125.0000 ug | ORAL_TABLET | Freq: Every morning | ORAL | Status: DC
  Administered 2022-07-09 – 2022-07-10 (×2): 125 ug via ORAL
  Filled 2022-07-08 (×2): qty 1

## 2022-07-08 MED ORDER — SULFASALAZINE 500 MG PO TBEC
1000.0000 mg | DELAYED_RELEASE_TABLET | Freq: Two times a day (BID) | ORAL | Status: DC
  Administered 2022-07-08 – 2022-07-09 (×2): 1000 mg via ORAL
  Filled 2022-07-08 (×6): qty 2

## 2022-07-08 MED ORDER — DICLOFENAC SODIUM 1 % EX GEL
4.0000 g | Freq: Four times a day (QID) | CUTANEOUS | Status: DC
  Administered 2022-07-08 – 2022-07-09 (×6): 4 g via TOPICAL
  Filled 2022-07-08 (×2): qty 100

## 2022-07-08 MED ORDER — TRAZODONE HCL 50 MG PO TABS
100.0000 mg | ORAL_TABLET | Freq: Every evening | ORAL | Status: DC | PRN
  Administered 2022-07-08 – 2022-07-09 (×2): 100 mg via ORAL
  Filled 2022-07-08 (×2): qty 2

## 2022-07-08 MED ORDER — BISACODYL 5 MG PO TBEC: 5.0000 mg | DELAYED_RELEASE_TABLET | Freq: Every day | ORAL | Status: DC | PRN

## 2022-07-08 MED ORDER — HEPARIN SODIUM (PORCINE) 5000 UNIT/ML IJ SOLN
5000.0000 [IU] | Freq: Three times a day (TID) | INTRAMUSCULAR | Status: DC
  Administered 2022-07-08 – 2022-07-09 (×3): 5000 [IU] via SUBCUTANEOUS
  Filled 2022-07-08 (×3): qty 1

## 2022-07-08 MED ORDER — SODIUM CHLORIDE 0.9% FLUSH
3.0000 mL | Freq: Two times a day (BID) | INTRAVENOUS | Status: DC
  Administered 2022-07-08 – 2022-07-10 (×4): 3 mL via INTRAVENOUS

## 2022-07-08 MED ORDER — INFLUENZA VAC A&B SA ADJ QUAD 0.5 ML IM PRSY
0.5000 mL | PREFILLED_SYRINGE | INTRAMUSCULAR | Status: AC
  Administered 2022-07-10: 0.5 mL via INTRAMUSCULAR

## 2022-07-08 MED ORDER — MEGESTROL ACETATE 400 MG/10ML PO SUSP
400.0000 mg | Freq: Two times a day (BID) | ORAL | Status: DC
  Administered 2022-07-08 – 2022-07-10 (×4): 400 mg via ORAL
  Filled 2022-07-08 (×4): qty 10

## 2022-07-08 MED ORDER — LEFLUNOMIDE 20 MG PO TABS
20.0000 mg | ORAL_TABLET | Freq: Every day | ORAL | Status: DC
  Administered 2022-07-09 – 2022-07-10 (×2): 20 mg via ORAL
  Filled 2022-07-08 (×3): qty 1

## 2022-07-08 MED ORDER — CLOPIDOGREL BISULFATE 75 MG PO TABS
75.0000 mg | ORAL_TABLET | Freq: Every day | ORAL | Status: DC
  Administered 2022-07-09 – 2022-07-10 (×2): 75 mg via ORAL
  Filled 2022-07-08 (×2): qty 1

## 2022-07-08 MED ORDER — GABAPENTIN 300 MG PO CAPS
300.0000 mg | ORAL_CAPSULE | Freq: Three times a day (TID) | ORAL | Status: DC
  Administered 2022-07-08 – 2022-07-10 (×6): 300 mg via ORAL
  Filled 2022-07-08 (×6): qty 1

## 2022-07-08 MED ORDER — LEVALBUTEROL HCL 0.63 MG/3ML IN NEBU: 0.6300 mg | INHALATION_SOLUTION | Freq: Four times a day (QID) | RESPIRATORY_TRACT | Status: DC | PRN

## 2022-07-08 MED ORDER — SODIUM CHLORIDE 0.9% FLUSH: 3.0000 mL | INTRAVENOUS | Status: DC | PRN

## 2022-07-08 MED ORDER — HYDRALAZINE HCL 20 MG/ML IJ SOLN: 10.0000 mg | INTRAMUSCULAR | Status: DC | PRN

## 2022-07-08 MED ORDER — SODIUM CHLORIDE 0.9% FLUSH: 3.0000 mL | Freq: Two times a day (BID) | INTRAVENOUS | Status: DC

## 2022-07-08 MED ORDER — ONDANSETRON HCL 4 MG PO TABS: 4.0000 mg | ORAL_TABLET | Freq: Four times a day (QID) | ORAL | Status: DC | PRN

## 2022-07-08 MED ORDER — ONDANSETRON HCL 4 MG/2ML IJ SOLN: 4.0000 mg | Freq: Four times a day (QID) | INTRAMUSCULAR | Status: DC | PRN

## 2022-07-08 MED ORDER — ACETAMINOPHEN 325 MG PO TABS: 650.0000 mg | ORAL_TABLET | Freq: Four times a day (QID) | ORAL | Status: DC | PRN

## 2022-07-08 MED ORDER — IPRATROPIUM BROMIDE 0.02 % IN SOLN: 0.5000 mg | Freq: Four times a day (QID) | RESPIRATORY_TRACT | Status: DC | PRN

## 2022-07-08 MED ORDER — SULFADIAZINE 500 MG PO TABS: 500.0000 mg | ORAL_TABLET | Freq: Two times a day (BID) | ORAL | Status: DC

## 2022-07-08 MED ORDER — SODIUM CHLORIDE 0.9 % IV BOLUS
500.0000 mL | Freq: Once | INTRAVENOUS | Status: AC
  Administered 2022-07-08: 500 mL via INTRAVENOUS

## 2022-07-08 MED ORDER — HYDROMORPHONE HCL 1 MG/ML IJ SOLN: 0.5000 mg | INTRAMUSCULAR | Status: DC | PRN

## 2022-07-08 MED ORDER — CITALOPRAM HYDROBROMIDE 20 MG PO TABS
40.0000 mg | ORAL_TABLET | Freq: Every day | ORAL | Status: DC
  Administered 2022-07-09 – 2022-07-10 (×2): 40 mg via ORAL
  Filled 2022-07-08 (×2): qty 2

## 2022-07-08 MED ORDER — ACETAMINOPHEN 650 MG RE SUPP: 650.0000 mg | Freq: Four times a day (QID) | RECTAL | Status: DC | PRN

## 2022-07-08 MED ORDER — CALCIUM CARBONATE 1250 (500 CA) MG PO TABS
1250.0000 mg | ORAL_TABLET | Freq: Every day | ORAL | Status: DC
  Administered 2022-07-09 – 2022-07-10 (×2): 1250 mg via ORAL
  Filled 2022-07-08 (×2): qty 1

## 2022-07-08 MED ORDER — HYDROXYCHLOROQUINE SULFATE 200 MG PO TABS
200.0000 mg | ORAL_TABLET | Freq: Every day | ORAL | Status: DC
  Administered 2022-07-09 – 2022-07-10 (×2): 200 mg via ORAL
  Filled 2022-07-08 (×2): qty 1

## 2022-07-08 NOTE — Hospital Course (Signed)
Erica Beasley is a 66 year old female with extensive history of rheumatoid arthritis, depression, GERD, hypothyroidism, chronic diarrhea, osteoporosis,?  COPD, chronic iron deficiency anemia, presenting with acute onset of speech difficulty, facial asymmetry, drooling since yesterday. Denies of any extremity weakness, denies any vision, no prior history of stroke

## 2022-07-08 NOTE — Progress Notes (Signed)
SLP Cancellation Note  Patient Details Name: NOVALYN LAJARA MRN: [id number] DOB: January 13, 1956   Cancelled treatment:       Reason Eval/Treat Not Completed: Other (comment). Note Pt passed [facility] stroke swallow screen per documentation in flowsheets. ST will complete request for BSE and sign off at this time. Please re-order our service if indicated. Thank you,  Benjamin Merrihew H. Roddie Mc, CCC-SLP Speech Language Pathologist    Wende Bushy 07/08/2022, 2:37 PM

## 2022-07-08 NOTE — Assessment & Plan Note (Signed)
-   Acute CVA onset yesterday with persistent dysarthria, facial asymmetry -MRI of the brain: IMPRESSION: 1. 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 2. No intracranial large vessel occlusion. Mild stenosis in the right-greater-than-left cavernous and supraclinoid ICAs.  -Admitted for stroke work-up -On-call neurologist Dr. Quinn Axe consulted  -We will follow-up with 2D echocardiogram, labs -Neurochecks -PT/OT/speech  -Patient has been initiated on aspirin, Plavix, high-dose statin

## 2022-07-08 NOTE — Assessment & Plan Note (Signed)
-   Initiating high-dose statin Lipitor 80 mg daily

## 2022-07-08 NOTE — Assessment & Plan Note (Signed)
-   Stable continue home medication of Celexa and trazodone

## 2022-07-08 NOTE — Assessment & Plan Note (Signed)
-  TSH 21.4 -Checking free T3 and T4 -  continue home dose of Synthroid -

## 2022-07-08 NOTE — ED Provider Notes (Signed)
[facility] Provider Note   CSN: [id number] Arrival date & time: 07/08/22  1000     History  No chief complaint on file.   Erica Beasley is a 66 y.o. female.  HPI Patient presents with concern for speech difficulty, facial asymmetry.  Onset was 18 hours ago.  Since that time symptoms have been persistent.  No extremity weakness, no vision loss, no new pain.  No history of stroke, she states that she takes all medication as directed.    Home Medications Prior to Admission medications   Medication Sig Start Date End Date Taking? Authorizing Provider  albuterol (VENTOLIN HFA) 108 (90 Base) MCG/ACT inhaler Inhale 2 puffs into the lungs every 6 (six) hours as needed for wheezing or shortness of breath.    [provider]  calcium carbonate (OSCAL) 1500 (600 Ca) MG TABS tablet Take by mouth.    [provider]  citalopram (CELEXA) 40 MG tablet TAKE ONE TABLET ONCE DAILY 04/28/22   Derek Jack, MD  diclofenac Sodium (VOLTAREN) 1 % GEL Apply 4 g topically 4 (four) times daily. 06/29/22   Elba Barman, DO  folic acid (FOLVITE) 1 MG tablet Take 1 tablet (1 mg total) by mouth daily. 12/08/21   Derek Jack, MD  gabapentin (NEURONTIN) 300 MG capsule Take 1 capsule (300 mg total) by mouth 3 (three) times daily. 04/28/22   Loman Brooklyn, FNP  hydroxychloroquine (PLAQUENIL) 200 MG tablet Take 200 mg by mouth daily.     [provider]  leflunomide (ARAVA) 20 MG tablet Take 20 mg by mouth daily.    [provider]  levothyroxine (SYNTHROID) 125 MCG tablet Take 1 tablet (125 mcg total) by mouth every morning. 04/28/22   Loman Brooklyn, FNP  loperamide (IMODIUM A-D) 2 MG tablet Take 1 tablet (2 mg total) by mouth in the morning. Take second dose if needed Patient taking differently: Take 2 mg by mouth as needed. Take second dose if needed 09/14/20   Dettinger, Fransisca Kaufmann, MD  megestrol (MEGACE) 400 MG/10ML suspension Take 10 mLs  (400 mg total) by mouth 2 (two) times daily. 03/04/22   Derek Jack, MD  pantoprazole (PROTONIX) 40 MG tablet TAKE ONE TABLET BY MOUTH DAILY BEFORE BREAKFAST 06/29/22   Gwenlyn Perking, FNP  potassium chloride SA (KLOR-CON M) 20 MEQ tablet TAKE ONE TABLET ONCE DAILY, NEED DOCTOR APPT. 03/02/22   Derek Jack, MD  pravastatin (PRAVACHOL) 40 MG tablet TAKE ONE TABLET AT BEDTIME 05/31/22   Loman Brooklyn, FNP  sulfaDIAZINE 500 MG tablet Take 500 mg by mouth 2 (two) times daily.    [provider]  traZODone (DESYREL) 100 MG tablet Take 1 tablet (100 mg total) by mouth at bedtime as needed. for sleep 04/28/22   Loman Brooklyn, FNP      Allergies    Upadacitinib    Review of Systems   Review of Systems  All other systems reviewed and are negative.   Physical Exam Updated Vital Signs There were no vitals taken for this visit. Physical Exam Vitals and nursing note reviewed.  Constitutional:      General: She is not in acute distress.    Appearance: She is well-developed.  HENT:     Head: Normocephalic and atraumatic.  Eyes:     Conjunctiva/sclera: Conjunctivae normal.  Cardiovascular:     Rate and Rhythm: Normal rate and regular rhythm.  Pulmonary:     Effort: Pulmonary effort is normal. No  respiratory distress.     Breath sounds: Normal breath sounds. No stridor.  Abdominal:     General: There is no distension.  Skin:    General: Skin is warm and dry.  Neurological:     Mental Status: She is alert and oriented to person, place, and time.     Cranial Nerves: Dysarthria and facial asymmetry present.     Motor: Atrophy present. No tremor or abnormal muscle tone.     Coordination: Coordination normal.  Psychiatric:        Mood and Affect: Mood normal.     ED Results / Procedures / Treatments   Labs (all labs ordered are listed, but only abnormal results are displayed) Labs Reviewed  CBG MONITORING, ED - Abnormal; Notable for the following components:       Result Value   Glucose-Capillary 124 (*)    All other components within normal limits  PROTIME-INR  APTT  CBC  DIFFERENTIAL  COMPREHENSIVE METABOLIC PANEL  ETHANOL  RAPID URINE DRUG SCREEN, HOSP PERFORMED  URINALYSIS, ROUTINE W REFLEX MICROSCOPIC  I-STAT CHEM 8, ED    EKG None  Radiology No results found.  Procedures Procedures    Medications Ordered in ED Medications  sodium chloride 0.9 % bolus 500 mL (has no administration in time range)    Followed by  0.9 %  sodium chloride infusion (has no administration in time range)    ED Course/ Medical Decision Making/ A&P This patient with a Hx of neuropathy, rheumatoid arthritis, diabetes, hypertension presents to the ED for concern of facial asymmetry and speech difficulty, this involves an extensive number of treatment options, and is a complaint that carries with it a high risk of complications and morbidity.    The differential diagnosis includes stroke, nerve palsy, infection, mass   Social Determinants of Health:  Advanced age  Additional history obtained:  Additional history and/or information obtained from chart review, notable for ongoing efforts for hypertension and rheumatoid control   After the initial evaluation, orders, including: MR labs ECG were initiated.   Patient placed on Cardiac and Pulse-Oximetry Monitors. The patient was maintained on a cardiac monitor.  The cardiac monitored showed an rhythm of 75 sinus normal The patient was also maintained on pulse oximetry. The readings were typically 100% room air normal   On repeat evaluation of the patient stayed the same  Lab Tests:  I personally interpreted labs.  The pertinent results include: Generally unremarkable  Imaging Studies ordered:  I independently visualized and interpreted imaging which showed I discussed this with the MRI team while the patient was having her imaging, there is MRI evidence for acute infarct, no LVO. I agree  with the radiologist interpretation  Consultations Obtained:  I requested consultation with the neurology,  and discussed lab and imaging findings as well as pertinent plan - they recommend: Admission to this facility initiation of dual antiplatelet therapy inpatient neuro consult for ongoing management  Dispostion / Final MDM:  After consideration of the diagnostic results and the patient's response to treatment, adult female presents with dysarthria that began yesterday, facial droop as well.  Patient's neuro exam is otherwise reassuring, but there is immediate concern for stroke, this is substantiated with MRI results, requiring admission for monitoring, management after initiation of dual antiplatelet therapy.  Final Clinical Impression(s) / ED Diagnoses Final diagnoses:  Acute CVA (cerebrovascular accident) [facility])     Carmin Muskrat, MD 07/08/22 1138

## 2022-07-08 NOTE — Consult Note (Signed)
I connected with  [name] on 07/08/22 by a video enabled telemedicine application and verified that I am speaking with the correct person using two identifiers.   I discussed the limitations of evaluation and management by telemedicine. The patient expressed understanding and agreed to proceed.  Location of patient: [facility] Location of physician: [facility]  Neurology Consultation Reason for Consult: Stroke Referring Physician: Dr. Skipper Cliche  CC: Slurred speech, facial droop  History is obtained from: Patient, chart review  HPI: Erica Beasley is a 66 y.o. female with past medical history of hypertension, hyperlipidemia, diabetes, hypothyroidism who presented with sudden onset slurred speech and facial droop.  States she was walking outside to her car from [place] and suddenly noticed that her speech was slurred and her face was droopy.  Denies ever having similar symptoms in the past.   Last known normal 07/10/2022 13-530 Event happened at home No tPA as outside window No thrombectomy as no large vessel occlusion mRS 0  ROS: All other systems reviewed and negative except as noted in the HPI.   Past Medical History:  Diagnosis Date   Aortic atherosclerosis ([place]) 11/13/2020   CKD (chronic kidney disease) stage 2, GFR 60-89 ml/min    COPD (chronic obstructive pulmonary disease) ([place])    COVID-19 06/2019   Depression    Essential hypertension    GERD (gastroesophageal reflux disease)    History of diabetes mellitus    Hyperlipidemia    Hypothyroidism    Neuropathy    Rheumatoid arthritis (HCC)    Vitamin B12 deficiency    Vitamin D deficiency     Family History  Problem Relation Age of Onset   Stroke Mother    Heart disease Mother    Diabetes Mother    Breast cancer Mother    Other Father        stomach taken out for some reason   Migraines Daughter    Colon cancer Neg Hx     Social History:  reports that she quit smoking about 4  years ago. Her smoking use included cigarettes. She has a 15.00 pack-year smoking history. She has quit using smokeless tobacco. She reports that she does not drink alcohol and does not use drugs.   Exam: Current vital signs: BP 118/78   Pulse 86   Temp 98.1 F (36.7 C)   Resp (!) 22   Ht '5\' 4"'$  (1.626 m)   Wt 49.9 kg   SpO2 96%   BMI 18.88 kg/m  Vital signs in last 24 hours: Temp:  [98.1 F (36.7 C)] 98.1 F (36.7 C) (10/13 1018) Pulse Rate:  [77-86] 86 (10/13 1200) Resp:  [20-29] 22 (10/13 1200) BP: (117-133)/(75-80) 118/78 (10/13 1200) SpO2:  [96 %-99 %] 96 % (10/13 1200) Weight:  [49.9 kg] 49.9 kg (10/13 1021)   Physical Exam  Constitutional: Appears well-developed and well-nourished.  Psych: Affect appropriate to situation Neuro: AOx3, cranial nerves II to XII appear grossly intact except left of left nasolabial fold and mild dysarthria, no aphasia, antigravity strength in all 4 extremities without drift, patient intact to light touch, FTN intact bilaterally  NIHSS 2  INPUTS: 1A: Level of consciousness --> 0 = Alert; keenly responsive 1B: Ask month and age --> 0 = Both questions right 1C: 'Blink eyes' & 'squeeze hands' --> 0 = Performs both tasks 2: Horizontal extraocular movements --> 0 = Normal 3: Visual fields --> 0 = No visual loss 4: Facial palsy -->  1 = Minor paralysis (flat nasolabial fold, smile asymmetry) 5A: Left arm motor drift --> 0 = No drift for 10 seconds 5B: Right arm motor drift --> 0 = No drift for 10 seconds 6A: Left leg motor drift --> 0 = No drift for 5 seconds 6B: Right leg motor drift --> 0 = No drift for 5 seconds 7: Limb Ataxia --> 0 = No ataxia 8: Sensation --> 0 = Normal; no sensory loss 9: Language/aphasia --> 0 = Normal; no aphasia 10: Dysarthria --> 1 = Mild-moderate dysarthria: slurring but can be understood 11: Extinction/inattention --> 0 = No abnormality    I have reviewed labs in epic and the results pertinent to this  consultation are: CBC:  Recent Labs  Lab 07/08/22 1013 07/08/22 1016  WBC 7.2  --   NEUTROABS 4.8  --   HGB 8.4* 9.2*  HCT 29.3* 27.0*  MCV 93.0  --   PLT 347  --     Basic Metabolic Panel:  Lab Results  Component Value Date   NA 139 07/08/2022   K 3.8 07/08/2022   CO2 20 (L) 07/08/2022   GLUCOSE 152 (H) 07/08/2022   BUN 7 (L) 07/08/2022   CREATININE 0.90 07/08/2022   CALCIUM 8.5 (L) 07/08/2022   GFRNONAA >60 07/08/2022   GFRAA 72 11/13/2020   Lipid Panel:  Lab Results  Component Value Date   LDLCALC 75 04/20/2022   HgbA1c:  Lab Results  Component Value Date   HGBA1C 4.8 01/28/2022   Urine Drug Screen: No results found for: "LABOPIA", "COCAINSCRNUR", "LABBENZ", "AMPHETMU", "THCU", "LABBARB"  Alcohol Level     Component Value Date/Time   ETH <10 07/08/2022 1013    I have reviewed the images obtained:  MRI brain without contrast, MRA head without contrast 07/08/2022: Acute infarct in the right posterior limb of the internal capsule and posterior lentiform nucleus. No intracranial large vessel occlusion. Mild stenosis in the right-greater-than-left cavernous and supraclinoid ICAs.    ASSESSMENT/PLAN: 27 old female who presented with acute episode of speech and MRI brain showed acute ischemic stroke in right internal capsule.  Acute ischemic stroke -Etiology: Likely small vessel disease versus embolic  Recommendations: -Recommend aspirin 81 mg daily and Plavix 75 mg daily for 3 weeks followed by aspirin 81 mg daily -Recommend high-density statin if LDL more than 70 -Carotid ultrasound to look for carotid stenosis -TTE to look for cardioembolic source -If TTE within normal limits, recommend 30-day event monitor at the time of discharge -PT/OT/speech therapy -SLP evaluation -Goal blood pressure: Permissive hypertension upto 220/110 for 24 hours followed by normotension -Modification of stroke risk factors  -Stroke education including BEFAST -Discussed plan  with patient, daughter at bedside and Dr. Roger Shelter   Thank you for allowing [place] to participate in the care of this patient. If you have any further questions, please contact  me or neurohospitalist.   Zeb Comfort [facility]ile[facility] neurohospitalist

## 2022-07-08 NOTE — Assessment & Plan Note (Signed)
-   On Protonix, history of C. difficile and diarrhea, we will holding Protonix

## 2022-07-08 NOTE — Assessment & Plan Note (Signed)
Continue home medication including Voltaren, Plaquenil,

## 2022-07-08 NOTE — Progress Notes (Addendum)
Alert and oriented.  Some left sided weakness but patient states that especially her left leg has been weaker than her right due to her back problems and she has been having left shoulder problems.  Ortho vitals complete and BP improved each reading.  Watched stoke education video and stroke handout given

## 2022-07-08 NOTE — ED Triage Notes (Signed)
Pt states she was in [place] yesterday around 3:30pm when she noticed her mouth was drawing and she had some slow and slurred speech;  pt states she did not feel well and went to get food and went home  Pt states she still feels like she has some slow speech today

## 2022-07-08 NOTE — Progress Notes (Signed)
  Echocardiogram 2D Echocardiogram has been performed.  Erica Beasley 07/08/2022, 3:31 PM

## 2022-07-08 NOTE — Assessment & Plan Note (Signed)
-   Monitoring hemoglobin, dropped from 8.5->> 6.6 today -Total iron 20, TIBC 134, saturation ratio 15 -Discussed with patient, agreed to transfuse 2 unit PRBC -Continue iron supplements, anticipating IV iron before discharge

## 2022-07-08 NOTE — ED Notes (Signed)
Ambulated pt around pt's room. Pt's O2 stayed at 100% the entire walk .

## 2022-07-08 NOTE — Assessment & Plan Note (Signed)
-   Currently stable, continue as needed Imodium

## 2022-07-08 NOTE — H&P (Signed)
History and Physical    Patient: Erica Beasley IEP:[id number] DOB: [birth year] DOA: 07/08/2022 DOS: the patient was seen and examined on 07/08/2022 PCP: Loman Brooklyn, FNP  Patient coming from: Home  Chief Complaint:  Chief Complaint  Patient presents with   Aphasia   HPI: Erica Beasley is a 66 y.o. female with extensive history of rheumatoid arthritis, depression, GERD, hypothyroidism, HTN, HLD, chronic diarrhea, osteoporosis,?  COPD, chronic iron deficiency anemia, presenting with acute onset of speech difficulty, facial asymmetry, drooling since yesterday. Denies of any extremity weakness, denies any vision, no prior history of stroke     Review of Systems: All systems reviewed all within normal limits except as above Past Medical History:  Diagnosis Date   Aortic atherosclerosis ([place]) 11/13/2020   CKD (chronic kidney disease) stage 2, GFR 60-89 ml/min    COPD (chronic obstructive pulmonary disease) ([place])    COVID-19 06/2019   Depression    Essential hypertension    GERD (gastroesophageal reflux disease)    History of diabetes mellitus    Hyperlipidemia    Hypothyroidism    Neuropathy    Rheumatoid arthritis (HCC)    Vitamin B12 deficiency    Vitamin D deficiency    Past Surgical History:  Procedure Laterality Date   BIOPSY  09/16/2019   Procedure: BIOPSY;  Surgeon: Daneil Dolin, MD;  [facility];  Service: Endoscopy;;   BIOPSY  04/28/2020   Procedure: BIOPSY;  Surgeon: Daneil Dolin, MD;  [facility];  Service: Endoscopy;;   BIOPSY  06/08/2020   Procedure: BIOPSY;  Surgeon: Daneil Dolin, MD;  [facility];  Service: Endoscopy;;   CATARACT EXTRACTION Bilateral    COLONOSCOPY  09/2010   Dr. Posey Pronto: mild diverticulsis in sigmoid colon.    COLONOSCOPY WITH PROPOFOL N/A 09/16/2019   Rourk: Diverticulosis, random colon biopsies negative for microscopic colitis.   COLONOSCOPY WITH PROPOFOL N/A 06/08/2020   Procedure: COLONOSCOPY  WITH PROPOFOL;  Surgeon: Daneil Dolin, MD;  [facility];  Service: Endoscopy;  Laterality: N/A;  9:15am   ESOPHAGOGASTRODUODENOSCOPY (EGD) WITH PROPOFOL N/A 04/28/2020   Procedure: ESOPHAGOGASTRODUODENOSCOPY (EGD) WITH PROPOFOL;  Surgeon: Daneil Dolin, MD;  [facility];  Service: Endoscopy;  Laterality: N/A;  6:60   YAG LASER APPLICATION Left 63/09/6008   Procedure: YAG LASER APPLICATION;  Surgeon: Williams Che, MD;  Location: [facility];  Service: Ophthalmology;  Laterality: Left;   Social History:  reports that she quit smoking about 4 years ago. Her smoking use included cigarettes. She has a 15.00 pack-year smoking history. She has quit using smokeless tobacco. She reports that she does not drink alcohol and does not use drugs.  Allergies  Allergen Reactions   Upadacitinib Other (See Comments)    Family History  Problem Relation Age of Onset   Stroke Mother    Heart disease Mother    Diabetes Mother    Breast cancer Mother    Other Father        stomach taken out for some reason   Migraines Daughter    Colon cancer Neg Hx     Prior to Admission medications   Medication Sig Start Date End Date Taking? Authorizing Provider  albuterol (VENTOLIN HFA) 108 (90 Base) MCG/ACT inhaler Inhale 2 puffs into the lungs every 6 (six) hours as needed for wheezing or shortness of breath.    [provider]  calcium carbonate (OSCAL) 1500 (600 Ca) MG  TABS tablet Take by mouth.    [provider]  citalopram (CELEXA) 40 MG tablet TAKE ONE TABLET ONCE DAILY 04/28/22   Derek Jack, MD  diclofenac Sodium (VOLTAREN) 1 % GEL Apply 4 g topically 4 (four) times daily. 06/29/22   Elba Barman, DO  folic acid (FOLVITE) 1 MG tablet Take 1 tablet (1 mg total) by mouth daily. 12/08/21   Derek Jack, MD  gabapentin (NEURONTIN) 300 MG capsule Take 1 capsule (300 mg total) by mouth 3 (three) times daily. 04/28/22   Loman Brooklyn, FNP  hydroxychloroquine  (PLAQUENIL) 200 MG tablet Take 200 mg by mouth daily.     [provider]  leflunomide (ARAVA) 20 MG tablet Take 20 mg by mouth daily.    [provider]  levothyroxine (SYNTHROID) 125 MCG tablet Take 1 tablet (125 mcg total) by mouth every morning. 04/28/22   Loman Brooklyn, FNP  loperamide (IMODIUM A-D) 2 MG tablet Take 1 tablet (2 mg total) by mouth in the morning. Take second dose if needed Patient taking differently: Take 2 mg by mouth as needed. Take second dose if needed 09/14/20   Dettinger, Fransisca Kaufmann, MD  megestrol (MEGACE) 400 MG/10ML suspension Take 10 mLs (400 mg total) by mouth 2 (two) times daily. 03/04/22   Derek Jack, MD  pantoprazole (PROTONIX) 40 MG tablet TAKE ONE TABLET BY MOUTH DAILY BEFORE BREAKFAST 06/29/22   Gwenlyn Perking, FNP  potassium chloride SA (KLOR-CON M) 20 MEQ tablet TAKE ONE TABLET ONCE DAILY, NEED DOCTOR APPT. 03/02/22   Derek Jack, MD  pravastatin (PRAVACHOL) 40 MG tablet TAKE ONE TABLET AT BEDTIME 05/31/22   Loman Brooklyn, FNP  sulfaDIAZINE 500 MG tablet Take 500 mg by mouth 2 (two) times daily.    [provider]  traZODone (DESYREL) 100 MG tablet Take 1 tablet (100 mg total) by mouth at bedtime as needed. for sleep 04/28/22   Loman Brooklyn, FNP    Physical Exam: Vitals:   07/08/22 1018 07/08/22 1020 07/08/22 1021 07/08/22 1127  BP:    129/77  Pulse:  85  77  Resp:  (!) 21  (!) 29  Temp: 98.1 F (36.7 C)     SpO2:  98%  99%  Weight:   49.9 kg   Height:   '5\' 4"'$  (1.626 m)       Physical Exam:   General:  AAO x 3,  cooperative, no distress; facial asymmetry  HEENT:  Normocephalic, PERRL, otherwise with in Normal limits   Neuro:  Facial asymmetry, dysarthria cognition intact  CNII-XII intact. , normal motor and sensation, reflexes intact   Lungs:   Clear to auscultation BL, Respirations unlabored,  No wheezes / crackles  Cardio:    S1/S2, RRR, No murmure, No Rubs or Gallops   Abdomen:  Soft,  non-tender, bowel sounds active all four quadrants, no guarding or peritoneal signs.  Muscular  skeletal:  Limited exam -global generalized weaknesses - in bed, able to move all 4 extremities,   2+ pulses,  symmetric, No pitting edema  Skin:  Dry, warm to touch, negative for any Rashes,  Wounds: Please see nursing documentation       Data Reviewed: All electronic data reviewed in detail including current labs and images   Assessment and Plan: * Stroke [facility]) - Acute CVA onset yesterday with persistent dysarthria, facial asymmetry -MRI of the brain: IMPRESSION: 1. Acute infarct in the right posterior limb of the internal capsule and posterior lentiform  nucleus. 2. No intracranial large vessel occlusion. Mild stenosis in the right-greater-than-left cavernous and supraclinoid ICAs.   -Admitted for stroke work-up -On-call neurologist Dr. Quinn Axe consulted  -We will follow-up with 2D echocardiogram, labs -Neurochecks -PT/OT/speech  -Patient has been initiated on aspirin, Plavix, high-dose statin   IDA (iron deficiency anemia) - Monitoring H&H, stable at baseline, continue supplement iron  Mixed hyperlipidemia - Initiating high-dose statin Lipitor 80 mg daily  Rheumatoid arthritis involving multiple sites ([place]) Continue home medication including Voltaren, Plaquenil,  Hypothyroidism - Stable checking TSH continue home dose of Synthroid -  GERD (gastroesophageal reflux disease) - On Protonix, history of C. difficile and diarrhea, we will holding Protonix  Depression, recurrent ([place]) - Stable continue home medication of Celexa and trazodone  Chronic diarrhea - Currently stable, continue as needed Imodium      Advance Care Planning:   Code Status: Full Code   Consults: Telemetry neurology  Family Communication:   Severity of Illness: The appropriate patient status for this patient is OBSERVATION. Observation status is judged to be reasonable and necessary in  order to provide the required intensity of service to ensure the patient's safety. The patient's presenting symptoms, physical exam findings, and initial radiographic and laboratory data in the context of their medical condition is felt to place them at decreased risk for further clinical deterioration. Furthermore, it is anticipated that the patient will be medically stable for discharge from the hospital within 2 midnights of admission.   Author: Deatra James, MD 07/08/2022 12:09 PM  >55 minutes was spent in admitting this patient reviewing the case in detail with the medical staff, consultants and patient.  For on call review [email or web address].

## 2022-07-09 DIAGNOSIS — D509 Iron deficiency anemia, unspecified: Secondary | ICD-10-CM | POA: Diagnosis not present

## 2022-07-09 DIAGNOSIS — Z87891 Personal history of nicotine dependence: Secondary | ICD-10-CM | POA: Diagnosis not present

## 2022-07-09 DIAGNOSIS — Z8249 Family history of ischemic heart disease and other diseases of the circulatory system: Secondary | ICD-10-CM | POA: Diagnosis not present

## 2022-07-09 DIAGNOSIS — E1140 Type 2 diabetes mellitus with diabetic neuropathy, unspecified: Secondary | ICD-10-CM | POA: Diagnosis present

## 2022-07-09 DIAGNOSIS — Z8616 Personal history of COVID-19: Secondary | ICD-10-CM | POA: Diagnosis not present

## 2022-07-09 DIAGNOSIS — R29810 Facial weakness: Secondary | ICD-10-CM | POA: Diagnosis present

## 2022-07-09 DIAGNOSIS — I639 Cerebral infarction, unspecified: Secondary | ICD-10-CM | POA: Diagnosis present

## 2022-07-09 DIAGNOSIS — I6389 Other cerebral infarction: Secondary | ICD-10-CM | POA: Diagnosis present

## 2022-07-09 DIAGNOSIS — R471 Dysarthria and anarthria: Secondary | ICD-10-CM | POA: Diagnosis present

## 2022-07-09 DIAGNOSIS — E039 Hypothyroidism, unspecified: Secondary | ICD-10-CM | POA: Diagnosis not present

## 2022-07-09 DIAGNOSIS — J449 Chronic obstructive pulmonary disease, unspecified: Secondary | ICD-10-CM | POA: Diagnosis present

## 2022-07-09 DIAGNOSIS — E782 Mixed hyperlipidemia: Secondary | ICD-10-CM | POA: Diagnosis present

## 2022-07-09 DIAGNOSIS — E1122 Type 2 diabetes mellitus with diabetic chronic kidney disease: Secondary | ICD-10-CM | POA: Diagnosis present

## 2022-07-09 DIAGNOSIS — M810 Age-related osteoporosis without current pathological fracture: Secondary | ICD-10-CM | POA: Diagnosis present

## 2022-07-09 DIAGNOSIS — Z23 Encounter for immunization: Secondary | ICD-10-CM | POA: Diagnosis not present

## 2022-07-09 DIAGNOSIS — K219 Gastro-esophageal reflux disease without esophagitis: Secondary | ICD-10-CM | POA: Diagnosis not present

## 2022-07-09 DIAGNOSIS — Z803 Family history of malignant neoplasm of breast: Secondary | ICD-10-CM | POA: Diagnosis not present

## 2022-07-09 DIAGNOSIS — K529 Noninfective gastroenteritis and colitis, unspecified: Secondary | ICD-10-CM | POA: Diagnosis not present

## 2022-07-09 DIAGNOSIS — M069 Rheumatoid arthritis, unspecified: Secondary | ICD-10-CM | POA: Diagnosis present

## 2022-07-09 DIAGNOSIS — I129 Hypertensive chronic kidney disease with stage 1 through stage 4 chronic kidney disease, or unspecified chronic kidney disease: Secondary | ICD-10-CM | POA: Diagnosis present

## 2022-07-09 DIAGNOSIS — R4701 Aphasia: Secondary | ICD-10-CM | POA: Diagnosis present

## 2022-07-09 DIAGNOSIS — Z79899 Other long term (current) drug therapy: Secondary | ICD-10-CM | POA: Diagnosis not present

## 2022-07-09 DIAGNOSIS — N182 Chronic kidney disease, stage 2 (mild): Secondary | ICD-10-CM | POA: Diagnosis present

## 2022-07-09 DIAGNOSIS — F32A Depression, unspecified: Secondary | ICD-10-CM | POA: Diagnosis present

## 2022-07-09 DIAGNOSIS — R29702 NIHSS score 2: Secondary | ICD-10-CM | POA: Diagnosis present

## 2022-07-09 HISTORY — DX: Cerebral infarction, unspecified: I63.9

## 2022-07-09 LAB — ECHOCARDIOGRAM COMPLETE BUBBLE STUDY
AR max vel: 2.96 cm2
AV Area VTI: 2.77 cm2
AV Area mean vel: 2.78 cm2
AV Mean grad: 3 mmHg
AV Peak grad: 6.2 mmHg
Ao pk vel: 1.24 m/s
Area-P 1/2: 3.63 cm2
S' Lateral: 2.6 cm

## 2022-07-09 LAB — URINALYSIS, ROUTINE W REFLEX MICROSCOPIC
Bilirubin Urine: NEGATIVE
Glucose, UA: NEGATIVE mg/dL
Hgb urine dipstick: NEGATIVE
Ketones, ur: NEGATIVE mg/dL
Nitrite: NEGATIVE
Protein, ur: NEGATIVE mg/dL
Specific Gravity, Urine: 1.009 (ref 1.005–1.030)
pH: 6 (ref 5.0–8.0)

## 2022-07-09 LAB — IRON AND TIBC
Iron: 20 ug/dL — ABNORMAL LOW (ref 28–170)
Saturation Ratios: 15 % (ref 10.4–31.8)
TIBC: 134 ug/dL — ABNORMAL LOW (ref 250–450)
UIBC: 114 ug/dL

## 2022-07-09 LAB — CBC
HCT: 22.8 % — ABNORMAL LOW (ref 36.0–46.0)
Hemoglobin: 6.6 g/dL — CL (ref 12.0–15.0)
MCH: 27.3 pg (ref 26.0–34.0)
MCHC: 28.9 g/dL — ABNORMAL LOW (ref 30.0–36.0)
MCV: 94.2 fL (ref 80.0–100.0)
Platelets: 293 10*3/uL (ref 150–400)
RBC: 2.42 MIL/uL — ABNORMAL LOW (ref 3.87–5.11)
RDW: 15.9 % — ABNORMAL HIGH (ref 11.5–15.5)
WBC: 5 10*3/uL (ref 4.0–10.5)
nRBC: 0 % (ref 0.0–0.2)

## 2022-07-09 LAB — PREPARE RBC (CROSSMATCH)

## 2022-07-09 LAB — BASIC METABOLIC PANEL
Anion gap: 5 (ref 5–15)
BUN: 7 mg/dL — ABNORMAL LOW (ref 8–23)
CO2: 20 mmol/L — ABNORMAL LOW (ref 22–32)
Calcium: 7.5 mg/dL — ABNORMAL LOW (ref 8.9–10.3)
Chloride: 115 mmol/L — ABNORMAL HIGH (ref 98–111)
Creatinine, Ser: 0.72 mg/dL (ref 0.44–1.00)
GFR, Estimated: >60 mL/min (ref >60)
Glucose, Bld: 109 mg/dL — ABNORMAL HIGH (ref 70–99)
Potassium: 3.4 mmol/L — ABNORMAL LOW (ref 3.5–5.1)
Sodium: 140 mmol/L (ref 135–145)

## 2022-07-09 LAB — HEMOGLOBIN AND HEMATOCRIT, BLOOD
HCT: 23.4 % — ABNORMAL LOW (ref 36.0–46.0)
Hemoglobin: 6.7 g/dL — CL (ref 12.0–15.0)

## 2022-07-09 LAB — T4, FREE: Free T4: 1.28 ng/dL — ABNORMAL HIGH (ref 0.61–1.12)

## 2022-07-09 LAB — APTT: aPTT: 38 seconds — ABNORMAL HIGH (ref 24–36)

## 2022-07-09 LAB — GLUCOSE, CAPILLARY: Glucose-Capillary: 95 mg/dL (ref 70–99)

## 2022-07-09 LAB — OCCULT BLOOD X 1 CARD TO LAB, STOOL: Fecal Occult Bld: NEGATIVE

## 2022-07-09 MED ORDER — POTASSIUM CHLORIDE CRYS ER 20 MEQ PO TBCR
40.0000 meq | EXTENDED_RELEASE_TABLET | Freq: Once | ORAL | Status: AC
  Administered 2022-07-09: 40 meq via ORAL
  Filled 2022-07-09: qty 2

## 2022-07-09 MED ORDER — POLYETHYLENE GLYCOL 3350 17 G PO PACK
17.0000 g | PACK | Freq: Once | ORAL | Status: DC
  Filled 2022-07-09: qty 1

## 2022-07-09 MED ORDER — SODIUM CHLORIDE 0.9% IV SOLUTION: Freq: Once | INTRAVENOUS | Status: AC

## 2022-07-09 MED ORDER — DIPHENHYDRAMINE HCL 25 MG PO CAPS
25.0000 mg | ORAL_CAPSULE | Freq: Once | ORAL | Status: AC
  Administered 2022-07-09: 25 mg via ORAL
  Filled 2022-07-09: qty 1

## 2022-07-09 MED ORDER — PANTOPRAZOLE SODIUM 40 MG PO TBEC
40.0000 mg | DELAYED_RELEASE_TABLET | Freq: Every day | ORAL | Status: DC
  Administered 2022-07-09 – 2022-07-10 (×2): 40 mg via ORAL
  Filled 2022-07-09 (×2): qty 1

## 2022-07-09 MED ORDER — ACETAMINOPHEN 325 MG PO TABS
650.0000 mg | ORAL_TABLET | Freq: Once | ORAL | Status: AC
  Administered 2022-07-09: 650 mg via ORAL
  Filled 2022-07-09: qty 2

## 2022-07-09 MED ORDER — SODIUM CHLORIDE 0.9 % IV SOLN
250.0000 mg | Freq: Every day | INTRAVENOUS | Status: DC
  Administered 2022-07-09 – 2022-07-10 (×2): 250 mg via INTRAVENOUS
  Filled 2022-07-09 (×3): qty 20

## 2022-07-09 MED ORDER — FUROSEMIDE 10 MG/ML IJ SOLN
20.0000 mg | Freq: Once | INTRAMUSCULAR | Status: AC
  Administered 2022-07-09: 20 mg via INTRAVENOUS
  Filled 2022-07-09: qty 2

## 2022-07-09 MED ORDER — PRAVASTATIN SODIUM 40 MG PO TABS
80.0000 mg | ORAL_TABLET | Freq: Every day | ORAL | Status: DC
  Administered 2022-07-09: 80 mg via ORAL
  Filled 2022-07-09: qty 2

## 2022-07-09 MED ORDER — ATORVASTATIN CALCIUM 40 MG PO TABS: 40.0000 mg | ORAL_TABLET | Freq: Every day | ORAL | Status: DC

## 2022-07-09 NOTE — Progress Notes (Signed)
Date and time results received: 07/09/22 0707 (use smartphrase ".now" to insert current time)  Test: hbg Critical Value: 6.6  Name of Provider Notified: Shahmehdi  Orders Received? Or Actions Taken?: No new orders at this time

## 2022-07-09 NOTE — Care Management Obs Status (Signed)
[place] NOTIFICATION   Patient Details  Name: Erica Beasley MRN: [id number] Date of Birth: 09/10/1956   Medicare Observation Status Notification Given:  Yes    Boneta Lucks, RN 07/09/2022, 8:42 AM

## 2022-07-09 NOTE — Progress Notes (Signed)
Date and time results received: 07/09/22 0910 (use smartphrase ".now" to insert current time)  Test: hgb Critical Value: 6.7  Name of Provider Notified: [facility]

## 2022-07-09 NOTE — Plan of Care (Signed)
  Problem: Acute Rehab PT Goals(only PT should resolve) Goal: Pt Will Go Supine/Side To Sit Outcome: Progressing Flowsheets (Taken 07/09/2022 1237) Pt will go Supine/Side to Sit: Independently Goal: Patient Will Transfer Sit To/From Stand Outcome: Progressing Flowsheets (Taken 07/09/2022 1237) Patient will transfer sit to/from stand: Independently Goal: Pt Will Transfer Bed To Chair/Chair To Bed Outcome: Progressing Flowsheets (Taken 07/09/2022 1237) Pt will Transfer Bed to Chair/Chair to Bed: Independently Goal: Pt Will Ambulate Outcome: Progressing Flowsheets (Taken 07/09/2022 1237) Pt will Ambulate:  100 feet  with modified independence  Independently  with least restrictive assistive device   12:38 PM, 07/09/22 Lonell Grandchild, MPT Physical Therapist with [facility] 336 [phone] office [phone] mobile phone

## 2022-07-09 NOTE — Evaluation (Signed)
Physical Therapy Evaluation Patient Details Name: Erica Beasley MRN: [id number] DOB: [birth year] Today's Date: 07/09/2022  History of Present Illness  Erica Beasley is a 66 y.o. female with extensive history of rheumatoid arthritis, depression, GERD, hypothyroidism, HTN, HLD, chronic diarrhea, osteoporosis,?  COPD, chronic iron deficiency anemia, presenting with acute onset of speech difficulty, facial asymmetry, drooling since yesterday.  Denies of any extremity weakness, denies any vision, no prior history of stroke   Clinical Impression  Patient functioning near baseline for functional mobility and gait other than limited for ambulation endurance due to c/o fatigue.  Patient demonstrates good return for transferring to/from commode in bathroom and walking in room/hallway without loss of balance.  Patient will benefit from continued skilled physical therapy in hospital and recommended venue below to increase strength, balance, endurance for safe ADLs and gait.         Recommendations for follow up therapy are one component of a multi-disciplinary discharge planning process, led by the attending physician.  Recommendations may be updated based on patient status, additional functional criteria and insurance authorization.  Follow Up Recommendations No PT follow up      Assistance Recommended at Discharge PRN  Patient can return home with the following  A little help with walking and/or transfers;Assistance with cooking/housework;Help with stairs or ramp for entrance    Equipment Recommendations None recommended by PT  Recommendations for Other Services       Functional Status Assessment Patient has had a recent decline in their functional status and demonstrates the ability to make significant improvements in function in a reasonable and predictable amount of time.     Precautions / Restrictions Precautions Precautions: None Restrictions Weight Bearing Restrictions: No       Mobility  Bed Mobility Overal bed mobility: Modified Independent                  Transfers Overall transfer level: Modified independent                      Ambulation/Gait Ambulation/Gait assistance: Supervision Gait Distance (Feet): 45 Feet Assistive device: None Gait Pattern/deviations: Decreased step length - right, Decreased step length - left, Decreased stride length Gait velocity: decreased     General Gait Details: slightly labored cadence without loss of balance, limited mostly due to c/o fatigue  Stairs            Wheelchair Mobility    Modified Rankin (Stroke Patients Only)       Balance Overall balance assessment: No apparent balance deficits (not formally assessed)                                           Pertinent Vitals/Pain Pain Assessment Pain Assessment: No/denies pain    Home Living Family/patient expects to be discharged to:: Private residence Living Arrangements: Children Available Help at Discharge: Family;Available PRN/intermittently Type of Home: House Home Access: Stairs to enter Entrance Stairs-Rails: None Entrance Stairs-Number of Steps: 1 Alternate Level Stairs-Number of Steps: Patient states she stays on first floor Home Layout: Two level;Able to live on main level with bedroom/bathroom;Full bath on main level Home Equipment: Rolling Walker (2 wheels);Cane - single point      Prior Function Prior Level of Function : Independent/Modified Independent             Mobility Comments: Hydrographic surveyor, drives ADLs  Comments: Independent     Hand Dominance   Dominant Hand: Right    Extremity/Trunk Assessment   Upper Extremity Assessment Upper Extremity Assessment: Defer to OT evaluation    Lower Extremity Assessment Lower Extremity Assessment: Overall WFL for tasks assessed    Cervical / Trunk Assessment Cervical / Trunk Assessment: Kyphotic  Communication    Communication: No difficulties  Cognition Arousal/Alertness: Awake/alert Behavior During Therapy: WFL for tasks assessed/performed Overall Cognitive Status: Within Functional Limits for tasks assessed                                          General Comments      Exercises     Assessment/Plan    PT Assessment Patient needs continued PT services  PT Problem List Decreased strength;Decreased activity tolerance;Decreased balance;Decreased mobility       PT Treatment Interventions DME instruction;Gait training;Stair training;Functional mobility training;Therapeutic activities;Therapeutic exercise;Patient/family education;Balance training    PT Goals (Current goals can be found in the Care Plan section)  Acute Rehab PT Goals Patient Stated Goal: return home with family to assist PT Goal Formulation: With patient Time For Goal Achievement: 07/11/22 Potential to Achieve Goals: Good    Frequency Min 2X/week     Co-evaluation               AM-PAC PT "6 Clicks" Mobility  Outcome Measure Help needed turning from your back to your side while in a flat bed without using bedrails?: None Help needed moving from lying on your back to sitting on the side of a flat bed without using bedrails?: None Help needed moving to and from a bed to a chair (including a wheelchair)?: None Help needed standing up from a chair using your arms (e.g., wheelchair or bedside chair)?: None Help needed to walk in hospital room?: A Little Help needed climbing 3-5 steps with a railing? : A Little 6 Click Score: 22    End of Session   Activity Tolerance: Patient tolerated treatment well;Patient limited by fatigue Patient left: in bed;with call bell/phone within reach Nurse Communication: Mobility status PT Visit Diagnosis: Unsteadiness on feet (R26.81);Other abnormalities of gait and mobility (R26.89);Muscle weakness (generalized) (M62.81)    Time: 9833-8250 PT Time Calculation  (min) (ACUTE ONLY): 21 min   Charges:   PT Evaluation $PT Eval Moderate Complexity: 1 Mod PT Treatments $Therapeutic Activity: 8-22 mins        12:36 PM, 07/09/22 Lonell Grandchild, MPT Physical Therapist with [facility] 336 [phone] office [phone] mobile phone

## 2022-07-09 NOTE — Progress Notes (Signed)
PROGRESS NOTE    Patient: Erica Beasley                            PCP: Loman Brooklyn, FNP                    DOB: 1956-06-05            DOA: 07/08/2022 XBM:[id number]             DOS: 07/09/2022, 11:40 AM   LOS: 0 days   Date of Service: The patient was seen and examined on 07/09/2022  Subjective:   The patient was seen and examined this morning. Noted for hemoglobin of 6.6 this morning, otherwise hemodynamically stable, blood pressure 106/60 Patient reports of no bleeding from anywhere Denies of having any tarry black stool or nausea vomiting that is bloody denies any abdominal pain  Due to stroke patient was started on aspirin Plavix yesterday 07/08/2022  Patient reports that her hemoglobin does drop from time to time but she has not had blood transfusion yet  Brief Narrative:   ALEXXUS SOBH is a 66 year old female with extensive history of rheumatoid arthritis, depression, GERD, hypothyroidism, chronic diarrhea, osteoporosis,?  COPD, chronic iron deficiency anemia, presenting with acute onset of speech difficulty, facial asymmetry, drooling since yesterday. Denies of any extremity weakness, denies any vision, no prior history of stroke     Assessment & Plan:   Principal Problem:   Stroke [facility]) Active Problems:   IDA (iron deficiency anemia)   Hypothyroidism   Rheumatoid arthritis involving multiple sites ([place])   Mixed hyperlipidemia   Depression, recurrent (HCC)   GERD (gastroesophageal reflux disease)   Chronic diarrhea     Assessment and Plan: * Stroke ([place]) - Acute CVA onset yesterday with persistent dysarthria, facial asymmetry -MRI of the brain: IMPRESSION: 1. 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 2. No intracranial large vessel occlusion. Mild stenosis in the right-greater-than-left cavernous and supraclinoid ICAs.      -On-call neurologist Dr. Hortense Ramal -2D echocardiogram,  -  LDL: 80,  HDL,36, TG 100 - A1c: 4.6  -Neurochecks: Improve aphasia, no changes of facial asymmetry -PT/OT/speech  -Patient has been initiated on aspirin, Plavix, statin    IDA (iron deficiency anemia) - Monitoring hemoglobin, dropped from 8.5->> 6.6 today -Total iron 20, TIBC 134, saturation ratio 15 -Discussed with patient, agreed to transfuse 2 unit PRBC -Continue iron supplements, anticipating IV iron before discharge  Mixed hyperlipidemia - Initiating high-dose statin Lipitor 80 mg daily  Rheumatoid arthritis involving multiple sites ([place]) Continue home medication including Voltaren, Plaquenil,  Hypothyroidism -TSH 21.4 -Checking free T3 and T4 -  continue home dose of Synthroid -  GERD (gastroesophageal reflux disease) - On Protonix, history of C. difficile and diarrhea, we will holding Protonix  Depression, recurrent (HCC) - Stable continue home medication of Celexa and trazodone  Chronic diarrhea - Currently stable, continue as needed Imodium            ----------------------------------------------------------------------------------------------------------------------------------------------- Nutritional status:  The patient's BMI is: Body mass index is 18.9 kg/m. I agree with the assessment and plan as outlined ------------------------------------------------------------------------------------------------------------------------------------------------  DVT prophylaxis:  Place and maintain sequential compression device Start: 07/09/22 1133 TED hose Start: 07/08/22 1149 SCDs Start: 07/08/22 1149   Code Status:   Code Status: Full Code  Family Communication: Daughter at bedside The above findings and plan of care has  been discussed with patient (and family)  in detail,  they expressed understanding and agreement of above. -Advance care planning has been discussed.   Admission status:   Status is: Observation The patient remains OBS appropriate and  will d/c before 2 midnights.     Procedures:   No admission procedures for hospital encounter.   Antimicrobials:  Anti-infectives (From admission, onward)    Start     Dose/Rate Route Frequency Ordered Stop   07/09/22 1000  hydroxychloroquine (PLAQUENIL) tablet 200 mg        200 mg Oral Daily 07/08/22 1217     07/08/22 1230  sulfaDIAZINE tablet 500 mg  Status:  Discontinued        500 mg Oral 2 times daily 07/08/22 1217 07/08/22 1500        Medication:   sodium chloride   Intravenous Once   aspirin EC  81 mg Oral Daily   atorvastatin  80 mg Oral Daily   calcium carbonate  1,250 mg Oral Q breakfast   citalopram  40 mg Oral Daily   clopidogrel  75 mg Oral Daily   diclofenac Sodium  4 g Topical QID   gabapentin  300 mg Oral TID   hydroxychloroquine  200 mg Oral Daily   influenza vaccine adjuvanted  0.5 mL Intramuscular Tomorrow-1000   leflunomide  20 mg Oral Daily   levothyroxine  125 mcg Oral q morning   megestrol  400 mg Oral BID   polyethylene glycol  17 g Oral Once   sodium chloride flush  3 mL Intravenous Q12H   sulfaSALAzine  1,000 mg Oral BID    acetaminophen **OR** acetaminophen, HYDROmorphone (DILAUDID) injection, ipratropium, levalbuterol, ondansetron **OR** ondansetron (ZOFRAN) IV, oxyCODONE, senna-docusate, traZODone   Objective:   Vitals:   07/09/22 0224 07/09/22 0603 07/09/22 1046 07/09/22 1128  BP: (!) 104/57 113/60 131/71 106/60  Pulse: 76 78 76 73  Resp: '17 17 18 18  '$ Temp: 98.3 F (36.8 C) 98.3 F (36.8 C) 98.2 F (36.8 C) 97.9 F (36.6 C)  TempSrc: Oral Oral Oral Oral  SpO2: 96% 95% 98% 100%  Weight:  49.9 kg    Height:        Intake/Output Summary (Last 24 hours) at 07/09/2022 1140 Last data filed at 07/08/2022 1849 Gross per 24 hour  Intake 480 ml  Output --  Net 480 ml   Filed Weights   07/08/22 1021 07/09/22 0603  Weight: 49.9 kg 49.9 kg     Examination:   Physical Exam  Constitution:  Alert, cooperative, no distress,   Appears calm and comfortable  No changes in facial asymmetry, aphasia has improved Psychiatric:   Normal and stable mood and affect, cognition intact,   HEENT:        Normocephalic, PERRL, otherwise with in Normal limits  Chest:         Chest symmetric Cardio vascular:  S1/S2, RRR, No murmure, No Rubs or Gallops  pulmonary: Clear to auscultation bilaterally, respirations unlabored, negative wheezes / crackles Abdomen: Soft, non-tender, non-distended, bowel sounds,no masses, no organomegaly Muscular skeletal: Limited exam - in bed, able to move all 4 extremities,   Neuro: Aphasic, cognition intact, notable facial asymmetry, droop,  Otherwise CNII-XII intact. , normal motor and sensation, reflexes intact  Extremities: No pitting edema lower extremities, +2 pulses  Skin: Dry, warm to touch, negative for any Rashes, No open wounds Wounds: per nursing documentation   ------------------------------------------------------------------------------------------------------------------------------------------    LABs:     Latest Ref  Rng & Units 07/09/2022    8:19 AM 07/09/2022    4:57 AM 07/08/2022   10:16 AM  CBC  WBC 4.0 - 10.5 K/uL  5.0    Hemoglobin 12.0 - 15.0 g/dL 6.7  6.6  9.2   Hematocrit 36.0 - 46.0 % 23.4  22.8  27.0   Platelets 150 - 400 K/uL  293        Latest Ref Rng & Units 07/09/2022    4:57 AM 07/08/2022   10:16 AM 07/08/2022   10:13 AM  CMP  Glucose 70 - 99 mg/dL 109  152  156   BUN 8 - 23 mg/dL '7  7  10   '$ Creatinine 0.44 - 1.00 mg/dL 0.72  0.90  0.89   Sodium 135 - 145 mmol/L 140  139  139   Potassium 3.5 - 5.1 mmol/L 3.4  3.8  3.8   Chloride 98 - 111 mmol/L 115  108  109   CO2 22 - 32 mmol/L 20   20   Calcium 8.9 - 10.3 mg/dL 7.5   8.5   Total Protein 6.5 - 8.1 g/dL   6.4   Total Bilirubin 0.3 - 1.2 mg/dL   0.4   Alkaline Phos 38 - 126 U/L   69   AST 15 - 41 U/L   11   ALT 0 - 44 U/L   7        Micro Results No results found for this or any previous  visit (from the past 240 hour(s)).  Radiology Reports ECHOCARDIOGRAM COMPLETE BUBBLE STUDY  Result Date: 07/09/2022    ECHOCARDIOGRAM REPORT   Patient Name:   [facility] A Sneath Date of Exam: 07/08/2022 Medical Rec #:  [id number]     Height:       64.0 in Accession #:    [id number]    Weight:       110.0 lb Date of Birth:  June 25, 1956      BSA:          1.517 m Patient Age:    66 years      BP:           118/78 mmHg Patient Gender: F             HR:           79 bpm. Exam Location:  [facility] Procedure: 2D Echo, Cardiac Doppler, Color Doppler and Saline Contrast Bubble            Study Indications:    Stroke  History:        Patient has no prior history of Echocardiogram examinations.                 COPD; Risk Factors:Dyslipidemia.  Sonographer:    Clayton Lefort RDCS (AE) Referring Phys: [id number] [place]  Sonographer Comments: Technically difficult study due to poor echo windows. IMPRESSIONS  1. Left ventricular ejection fraction, by estimation, is 60 to 65%. The left ventricle has normal function. Left ventricular endocardial border not optimally defined to evaluate regional wall motion. Left ventricular diastolic parameters are consistent with Grade I diastolic dysfunction (impaired relaxation).  2. Right ventricular systolic function is normal. The right ventricular size is normal. Tricuspid regurgitation signal is inadequate for assessing PA pressure.  3. The mitral valve is grossly normal. No evidence of mitral valve regurgitation. No evidence of mitral stenosis.  4. The aortic valve is abnormal. Unable to determine aortic valve morphology due to  image quality. There is moderate calcification of the aortic valve. Aortic valve regurgitation is not visualized. No aortic stenosis is present, however Doppler assessment for AS was suboptimal. Consider follow up echocardiogram as outpatient to reassess.  5. The inferior vena cava is normal in size with greater than 50% respiratory variability, suggesting right  atrial pressure of 3 mmHg.  6. Agitated saline contrast bubble study was negative, with no evidence of any interatrial shunt. FINDINGS  Left Ventricle: Left ventricular ejection fraction, by estimation, is 60 to 65%. The left ventricle has normal function. Left ventricular endocardial border not optimally defined to evaluate regional wall motion. The left ventricular internal cavity size was normal in size. There is no left ventricular hypertrophy. Left ventricular diastolic parameters are consistent with Grade I diastolic dysfunction (impaired relaxation). Right Ventricle: The right ventricular size is normal. No increase in right ventricular wall thickness. Right ventricular systolic function is normal. Tricuspid regurgitation signal is inadequate for assessing PA pressure. Left Atrium: Left atrial size was normal in size. Right Atrium: Right atrial size was normal in size. Pericardium: There is no evidence of pericardial effusion. Mitral Valve: The mitral valve is grossly normal. Mild mitral annular calcification. No evidence of mitral valve regurgitation. No evidence of mitral valve stenosis. Tricuspid Valve: The tricuspid valve is normal in structure. Tricuspid valve regurgitation is trivial. No evidence of tricuspid stenosis. Aortic Valve: The aortic valve is abnormal. There is moderate calcification of the aortic valve. Aortic valve regurgitation is not visualized. No aortic stenosis is present. Aortic valve mean gradient measures 3.0 mmHg. Aortic valve peak gradient measures 6.2 mmHg. Aortic valve area, by VTI measures 2.77 cm. Pulmonic Valve: The pulmonic valve was normal in structure. Pulmonic valve regurgitation is not visualized. No evidence of pulmonic stenosis. Aorta: The ascending aorta was not well visualized. Venous: The inferior vena cava is normal in size with greater than 50% respiratory variability, suggesting right atrial pressure of 3 mmHg. IAS/Shunts: No atrial level shunt detected by color  flow Doppler. Agitated saline contrast was given intravenously to evaluate for intracardiac shunting. Agitated saline contrast bubble study was negative, with no evidence of any interatrial shunt.  LEFT VENTRICLE PLAX 2D LVIDd:         4.40 cm   Diastology LVIDs:         2.60 cm   LV e' medial:    6.96 cm/s LV PW:         1.00 cm   LV E/e' medial:  11.6 LV IVS:        0.90 cm   LV e' lateral:   10.80 cm/s LVOT diam:     2.10 cm   LV E/e' lateral: 7.5 LV SV:         59 LV SV Index:   39 LVOT Area:     3.46 cm  RIGHT VENTRICLE             IVC RV Basal diam:  2.70 cm     IVC diam: 1.20 cm RV S prime:     12.00 cm/s TAPSE (M-mode): 1.6 cm LEFT ATRIUM           Index        RIGHT ATRIUM           Index LA diam:      3.00 cm 1.98 cm/m   RA Area:     10.20 cm LA Vol (A2C): 18.1 ml 11.93 ml/m  RA Volume:   18.00 ml  11.86 ml/m LA Vol (A4C): 16.0 ml 10.54 ml/m  AORTIC VALVE AV Area (Vmax):    2.96 cm AV Area (Vmean):   2.78 cm AV Area (VTI):     2.77 cm AV Vmax:           124.00 cm/s AV Vmean:          81.400 cm/s AV VTI:            0.211 m AV Peak Grad:      6.2 mmHg AV Mean Grad:      3.0 mmHg LVOT Vmax:         106.00 cm/s LVOT Vmean:        65.400 cm/s LVOT VTI:          0.169 m LVOT/AV VTI ratio: 0.80  AORTA Ao Root diam: 3.00 cm Ao Asc diam:  3.00 cm MITRAL VALVE MV Area (PHT): 3.63 cm     SHUNTS MV Decel Time: 209 msec     Systemic VTI:  0.17 m MV E velocity: 81.00 cm/s   Systemic Diam: 2.10 cm MV A velocity: 107.00 cm/s MV E/A ratio:  0.76 Cherlynn Kaiser MD Electronically signed by Cherlynn Kaiser MD Signature Date/Time: 07/09/2022/11:08:56 AM    Final    US Carotid Bilateral  Result Date: 07/08/2022 CLINICAL DATA:  Speech difficulty and facial droop. Acute infarct in right posterior limb of internal capsule and posterior lentiform nucleus. EXAM: BILATERAL CAROTID DUPLEX ULTRASOUND TECHNIQUE: Pearline Cables scale imaging, color Doppler and duplex ultrasound were performed of bilateral carotid and vertebral  arteries in the neck. COMPARISON:  Brain MRI 07/08/2022 FINDINGS: Criteria: Quantification of carotid stenosis is based on velocity parameters that correlate the residual internal carotid diameter with NASCET-based stenosis levels, using the diameter of the distal internal carotid lumen as the denominator for stenosis measurement. The following velocity measurements were obtained: RIGHT ICA: 139/42 cm/sec CCA: 59/56 cm/sec SYSTOLIC ICA/CCA RATIO:  1.6 ECA: 169 cm/sec LEFT ICA: 193/74 cm/sec CCA: 38/75 cm/sec SYSTOLIC ICA/CCA RATIO:  2.8 ECA: 66 cm/sec RIGHT CAROTID ARTERY: Large focus of echogenic plaque at the right carotid bulb with posterior acoustic shadowing. External carotid artery is patent with normal waveform. Peak systolic velocity in the proximal internal carotid artery is mildly elevated measuring 139 cm/sec. Mid and distal right internal carotid artery are patent but tortuous. RIGHT VERTEBRAL ARTERY: Antegrade flow and normal waveform in the right vertebral artery. LEFT CAROTID ARTERY: Large amount of echogenic plaque and posterior acoustic shadowing at the left carotid bulb. External carotid artery is patent with normal waveform. Irregular plaque in the proximal internal carotid artery. Elevated peak systolic velocity in the proximal internal carotid artery measuring 193 cm/sec. Mid and distal left internal carotid artery are patent. LEFT VERTEBRAL ARTERY: Antegrade flow and normal waveform in the left vertebral artery. IMPRESSION: 1. Atherosclerotic plaque involving bilateral carotid arteries most prominent at the carotid bulbs. Estimated degree of stenosis in the internal carotid arteries is 50-69% bilaterally. 2. Patent vertebral arteries with antegrade flow. Electronically Signed   By: Markus Daft M.D.   On: 07/08/2022 14:24    SIGNED: Deatra James, MD, FHM. [facility],  Pager (please use [email or web address] to page/text) Please use Epic Secure Chat for non-urgent communication  (7AM-7PM)  If 7PM-7AM, please contact night-coverage [email or web address], 07/09/2022, 11:40 AM

## 2022-07-09 NOTE — TOC Progression Note (Signed)
  Transition of Care [facility]) Screening Note   Patient Details  Name: Erica Beasley Date of Birth: 1956-06-13   Transition of Care [facility]) CM/SW Contact:    Boneta Lucks, RN Phone Number: 07/09/2022, 8:44 AM    Transition of Care Department [facility]) has reviewed patient and no TOC needs have been identified at this time. We will continue to monitor patient advancement through interdisciplinary progression rounds. If new patient transition needs arise, please place a TOC consult.     Expected Discharge Plan: Home/Self Care    Expected Discharge Plan and Services Expected Discharge Plan: Home/Self Care

## 2022-07-09 NOTE — Consult Note (Addendum)
Consulting  Provider: Dr. Roger Shelter Primary Care Physician:  Loman Brooklyn, FNP Primary Gastroenterologist:  Dr. Gala Romney  Reason for Consultation: Acute on chronic anemia  HPI:  Erica Beasley is a 66 y.o. female with a past medical history of rheumatoid arthritis, depression, hypothyroidism, osteoporosis, chronic iron deficiency anemia with extensive GI work-up previously, COPD, who presented to [facility], ER yesterday with acute onset of speech difficulty, facial asymmetry, drooling.  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  Started on aspirin and Plavix.  This morning her hemoglobin dropped to 6.6 from 8.5.  Currently receiving 2 units of PRBCs.  Hemoccult negative.  Patient denies any overt GI bleeding including melena or hematochezia.  No abdominal pain.  Has a history of iron deficiency in the past undergoing extensive GI work-up which has been largely unrevealing.  EGD 04/2020:  Normal esophagus. - Normal stomach. - Duodenal erosions. Status post gastric and duodenal biopsy -gastric bx with slight inflammation. No H.pylori.  -duodenal bx benign   Colonoscopy 06/08/20 with mild pancolonic diverticulosis normal appearing colonic mucosa otherwise status post segmental biopsies and stool sampling which found no significant histopathological changes and GI path panel negative.   Colonoscopy 2020 with diverticulosis and random colon biopsies negative for microscopic colitis and recommended repeat in 5 years (2025).  Currently being worked up for possible capsule endoscopy in the outpatient setting.  Past Medical History:  Diagnosis Date   Aortic atherosclerosis ([place]) 11/13/2020   CKD (chronic kidney disease) stage 2, GFR 60-89 ml/min    COPD (chronic obstructive pulmonary disease) ([place])    COVID-19 06/2019   Depression    Essential hypertension    GERD (gastroesophageal reflux disease)    History of diabetes  mellitus    Hyperlipidemia    Hypothyroidism    Neuropathy    Rheumatoid arthritis (HCC)    Vitamin B12 deficiency    Vitamin D deficiency     Past Surgical History:  Procedure Laterality Date   BIOPSY  09/16/2019   Procedure: BIOPSY;  Surgeon: Daneil Dolin, MD;  [facility]: [facility];  Service: Endoscopy;;   BIOPSY  04/28/2020   Procedure: BIOPSY;  Surgeon: Daneil Dolin, MD;  [facility]: [facility];  Service: Endoscopy;;   BIOPSY  06/08/2020   Procedure: BIOPSY;  Surgeon: Daneil Dolin, MD;  [facility]: [facility];  Service: Endoscopy;;   CATARACT EXTRACTION Bilateral    COLONOSCOPY  09/2010   Dr. Posey Pronto: mild diverticulsis in sigmoid colon.    COLONOSCOPY WITH PROPOFOL N/A 09/16/2019   Rourk: Diverticulosis, random colon biopsies negative for microscopic colitis.   COLONOSCOPY WITH PROPOFOL N/A 06/08/2020   Procedure: COLONOSCOPY WITH PROPOFOL;  Surgeon: Daneil Dolin, MD;  [facility];  Service: Endoscopy;  Laterality: N/A;  9:15am   ESOPHAGOGASTRODUODENOSCOPY (EGD) WITH PROPOFOL N/A 04/28/2020   Procedure: ESOPHAGOGASTRODUODENOSCOPY (EGD) WITH PROPOFOL;  Surgeon: Daneil Dolin, MD;  [facility];  Service: Endoscopy;  Laterality: N/A;  8:65   YAG LASER APPLICATION Left [id number]/9629   Procedure: YAG LASER APPLICATION;  Surgeon: Williams Che, MD;  Location: [facility];  Service: Ophthalmology;  Laterality: Left;    Prior to Admission medications   Medication Sig Start Date End Date Taking? Authorizing Provider  albuterol (VENTOLIN HFA) 108 (90 Base) MCG/ACT inhaler Inhale 2 puffs into the lungs every 6 (six) hours as needed for wheezing or shortness of breath.   Yes  [provider]  calcium carbonate (OSCAL) 1500 (600 Ca) MG TABS tablet Take 1,500 mg by mouth daily with breakfast.   Yes [provider]  citalopram (CELEXA) 40 MG tablet TAKE ONE TABLET ONCE DAILY 04/28/22  Yes Derek Jack, MD  diclofenac Sodium (VOLTAREN) 1 %  GEL Apply 4 g topically 4 (four) times daily. 06/29/22  Yes Elba Barman, DO  folic acid (FOLVITE) 1 MG tablet Take 1 tablet (1 mg total) by mouth daily. 12/08/21  Yes Derek Jack, MD  gabapentin (NEURONTIN) 300 MG capsule Take 1 capsule (300 mg total) by mouth 3 (three) times daily. 04/28/22  Yes Hendricks Limes F, FNP  hydroxychloroquine (PLAQUENIL) 200 MG tablet Take 200 mg by mouth daily.    Yes [provider]  leflunomide (ARAVA) 20 MG tablet Take 20 mg by mouth daily.   Yes [provider]  levothyroxine (SYNTHROID) 125 MCG tablet Take 1 tablet (125 mcg total) by mouth every morning. 04/28/22  Yes Hendricks Limes F, FNP  loperamide (IMODIUM A-D) 2 MG tablet Take 1 tablet (2 mg total) by mouth in the morning. Take second dose if needed Patient taking differently: Take 2 mg by mouth as needed for diarrhea or loose stools. Take second dose if needed 09/14/20  Yes Dettinger, Fransisca Kaufmann, MD  megestrol (MEGACE) 400 MG/10ML suspension Take 10 mLs (400 mg total) by mouth 2 (two) times daily. 03/04/22  Yes Derek Jack, MD  pantoprazole (PROTONIX) 40 MG tablet TAKE ONE TABLET BY MOUTH DAILY BEFORE BREAKFAST Patient taking differently: Take 40 mg by mouth daily. 06/29/22  Yes Gwenlyn Perking, FNP  potassium chloride SA (KLOR-CON M) 20 MEQ tablet TAKE ONE TABLET ONCE DAILY, NEED DOCTOR APPT. Patient taking differently: Take 20 mEq by mouth daily. 03/02/22  Yes Derek Jack, MD  pravastatin (PRAVACHOL) 40 MG tablet TAKE ONE TABLET AT BEDTIME Patient taking differently: Take 40 mg by mouth daily. 05/31/22  Yes Hendricks Limes F, FNP  sulfaSALAzine (AZULFIDINE) 500 MG tablet Take 500 mg by mouth 2 (two) times daily.   Yes [provider]  traZODone (DESYREL) 100 MG tablet Take 1 tablet (100 mg total) by mouth at bedtime as needed. for sleep Patient taking differently: Take 100 mg by mouth at bedtime. for sleep 04/28/22  Yes Hendricks Limes F, FNP    Current  Facility-Administered Medications  Medication Dose Route Frequency Provider Last Rate Last Admin   acetaminophen (TYLENOL) tablet 650 mg  650 mg Oral Q6H PRN Shahmehdi, Seyed A, MD       Or   acetaminophen (TYLENOL) suppository 650 mg  650 mg Rectal Q6H PRN Deatra James, MD       aspirin EC tablet 81 mg  81 mg Oral Daily Carmin Muskrat, MD   81 mg at 07/09/22 0818   calcium carbonate (OS-CAL - dosed in mg of elemental calcium) tablet 1,250 mg  1,250 mg Oral Q breakfast Shahmehdi, Seyed A, MD   1,250 mg at 07/09/22 0819   citalopram (CELEXA) tablet 40 mg  40 mg Oral Daily Shahmehdi, Seyed A, MD   40 mg at 07/09/22 0819   clopidogrel (PLAVIX) tablet 75 mg  75 mg Oral Daily Shahmehdi, Seyed A, MD   75 mg at 07/09/22 0819   diclofenac Sodium (VOLTAREN) 1 % topical gel 4 g  4 g Topical QID Shahmehdi, Seyed A, MD   4 g at 07/09/22 1004   ferric gluconate (FERRLECIT) 250 mg in sodium chloride 0.9 % 250 mL IVPB  250 mg Intravenous Daily Shahmehdi, Seyed A, MD       gabapentin (NEURONTIN) capsule 300 mg  300 mg Oral TID Skipper Cliche A, MD   300 mg at 07/09/22 0818   HYDROmorphone (DILAUDID) injection 0.5-1 mg  0.5-1 mg Intravenous Q2H PRN Shahmehdi, Seyed A, MD       hydroxychloroquine (PLAQUENIL) tablet 200 mg  200 mg Oral Daily Shahmehdi, Seyed A, MD   200 mg at 07/09/22 0819   influenza vaccine adjuvanted (FLUAD) injection 0.5 mL  0.5 mL Intramuscular Tomorrow-1000 Shahmehdi, Seyed A, MD       ipratropium (ATROVENT) nebulizer solution 0.5 mg  0.5 mg Nebulization Q6H PRN Shahmehdi, Seyed A, MD       leflunomide (ARAVA) tablet 20 mg  20 mg Oral Daily Shahmehdi, Seyed A, MD   20 mg at 07/09/22 0818   levalbuterol (XOPENEX) nebulizer solution 0.63 mg  0.63 mg Nebulization Q6H PRN Shahmehdi, Valeria Batman, MD       levothyroxine (SYNTHROID) tablet 125 mcg  125 mcg Oral q morning Skipper Cliche A, MD   125 mcg at 07/09/22 0549   megestrol (MEGACE) 400 MG/10ML suspension 400 mg  400 mg Oral BID  Skipper Cliche A, MD   400 mg at 07/09/22 0819   ondansetron (ZOFRAN) tablet 4 mg  4 mg Oral Q6H PRN Shahmehdi, Seyed A, MD       Or   ondansetron (ZOFRAN) injection 4 mg  4 mg Intravenous Q6H PRN Shahmehdi, Seyed A, MD       oxyCODONE (Oxy IR/ROXICODONE) immediate release tablet 5 mg  5 mg Oral Q4H PRN Shahmehdi, Seyed A, MD       pantoprazole (PROTONIX) EC tablet 40 mg  40 mg Oral Daily Shahmehdi, Seyed A, MD   40 mg at 07/09/22 1313   polyethylene glycol (MIRALAX / GLYCOLAX) packet 17 g  17 g Oral Once Shahmehdi, Seyed A, MD       pravastatin (PRAVACHOL) tablet 80 mg  80 mg Oral q1800 Shahmehdi, Seyed A, MD       sodium chloride flush (NS) 0.9 % injection 3 mL  3 mL Intravenous Q12H Shahmehdi, Seyed A, MD   3 mL at 07/09/22 1601   sulfaSALAzine (AZULFIDINE) EC tablet 1,000 mg  1,000 mg Oral BID Madueme, Elvira C, RPH   1,000 mg at 07/09/22 0818   traZODone (DESYREL) tablet 100 mg  100 mg Oral QHS PRN Skipper Cliche A, MD   100 mg at 07/08/22 2242    Allergies as of 07/08/2022 - Review Complete 07/08/2022  Allergen Reaction Noted   Upadacitinib Other (See Comments) 10/04/2021    Family History  Problem Relation Age of Onset   Stroke Mother    Heart disease Mother    Diabetes Mother    Breast cancer Mother    Other Father        stomach taken out for some reason   Migraines Daughter    Colon cancer Neg Hx     Social History   Socioeconomic History   Marital status: Widowed    Spouse name: Not on file   Number of children: 1   Years of education: Not on file   Highest education level: Not on file  Occupational History   Occupation: DISABLED  Tobacco Use   Smoking status: Former    Packs/day: 1.00    Years: 15.00    Total pack years: 15.00    Types: Cigarettes    Quit date: 09/11/2017  Years since quitting: 4.8   Smokeless tobacco: Former  Scientific laboratory technician Use: Never used  Substance and Sexual Activity   Alcohol use: Never   Drug use: Never   Sexual  activity: Not Currently  Other Topics Concern   Not on file  Social History Narrative   Not on file   Social Determinants of Health   Financial Resource Strain: Low Risk  (09/03/2020)   Overall Financial Resource Strain (CARDIA)    Difficulty of Paying Living Expenses: Not hard at all  Food Insecurity: No Food Insecurity (07/08/2022)   Hunger Vital Sign    Worried About Running Out of Food in the Last Year: Never true    [place] in the Last Year: Never true  Transportation Needs: No Transportation Needs (07/08/2022)   PRAPARE - Hydrologist (Medical): No    Lack of Transportation (Non-Medical): No  Physical Activity: Insufficiently Active (09/03/2020)   Exercise Vital Sign    Days of Exercise per Week: 3 days    Minutes of Exercise per Session: 30 min  Stress: No Stress Concern Present (09/03/2020)   [place]    Feeling of Stress : Only a little  Social Connections: Socially Isolated (09/03/2020)   Social Connection and Isolation Panel [NHANES]    Frequency of Communication with Friends and Family: More than three times a week    Frequency of Social Gatherings with Friends and Family: More than three times a week    Attends Religious Services: Never    Marine scientist or Organizations: No    Attends Archivist Meetings: Never    Marital Status: Widowed  Intimate Partner Violence: Not At Risk (07/08/2022)   Humiliation, Afraid, Rape, and Kick questionnaire    Fear of Current or Ex-Partner: No    Emotionally Abused: No    Physically Abused: No    Sexually Abused: No    Review of Systems: General: Negative for anorexia, weight loss, fever, chills, fatigue, weakness. Eyes: Negative for vision changes.  ENT: Negative for hoarseness, difficulty swallowing , nasal congestion. CV: Negative for chest pain, angina, palpitations, dyspnea on exertion, peripheral  edema.  Respiratory: Negative for dyspnea at rest, dyspnea on exertion, cough, sputum, wheezing.  GI: See history of present illness. GU:  Negative for dysuria, hematuria, urinary incontinence, urinary frequency, nocturnal urination.  MS: Negative for joint pain, low back pain.  Derm: Negative for rash or itching.  Neuro: Negative for weakness, abnormal sensation, seizure, frequent headaches, memory loss, confusion.  Psych: Negative for anxiety, depression Endo: Negative for unusual weight change.  Heme: Negative for bruising or bleeding. Allergy: Negative for rash or hives.  Physical Exam: Vital signs in last 24 hours: Temp:  [97.9 F (36.6 C)-98.6 F (37 C)] 97.9 F (36.6 C) (10/14 1128) Pulse Rate:  [73-82] 73 (10/14 1128) Resp:  [16-23] 18 (10/14 1128) BP: (93-131)/(57-74) 106/60 (10/14 1128) SpO2:  [95 %-100 %] 100 % (10/14 1128) Weight:  [49.9 kg] 49.9 kg (10/14 0603) Last BM Date : 07/07/22 General:   Alert,  Well-developed, well-nourished, pleasant and cooperative in NAD, facial asymmetry noted Head:  Normocephalic and atraumatic. Eyes:  Sclera clear, no icterus.   Conjunctiva pink. Ears:  Normal auditory acuity. Nose:  No deformity, discharge,  or lesions. Mouth:  No deformity or lesions, dentition normal. Neck:  Supple; no masses or thyromegaly. Lungs:  Clear throughout to auscultation.  No wheezes, crackles, or rhonchi. No acute distress. Heart:  Regular rate and rhythm; no murmurs, clicks, rubs,  or gallops. Abdomen:  Soft, nontender and nondistended. No masses, hepatosplenomegaly or hernias noted. Normal bowel sounds, without guarding, and without rebound.   Msk:  Symmetrical without gross deformities. Normal posture. Pulses:  Normal pulses noted. Extremities:  Without clubbing or edema. Neurologic:  Alert and  oriented x4;  dysarthria Skin:  Intact without significant lesions or rashes. Cervical Nodes:  No significant cervical adenopathy. Psych:  Alert and  cooperative. Normal mood and affect.  Intake/Output from previous day: 10/13 0701 - 10/14 0700 In: 480 [P.O.:180; I.V.:300] Out: -  Intake/Output this shift: Total I/O In: 2373.1 [P.O.:480; I.V.:1893.1] Out: -   Lab Results: Recent Labs    07/08/22 1013 07/08/22 1016 07/09/22 0457 07/09/22 0819  WBC 7.2  --  5.0  --   HGB 8.4* 9.2* 6.6* 6.7*  HCT 29.3* 27.0* 22.8* 23.4*  PLT 347  --  293  --    BMET Recent Labs    07/08/22 1013 07/08/22 1016 07/09/22 0457  NA 139 139 140  K 3.8 3.8 3.4*  CL 109 108 115*  CO2 20*  --  20*  GLUCOSE 156* 152* 109*  BUN 10 7* 7*  CREATININE 0.89 0.90 0.72  CALCIUM 8.5*  --  7.5*   LFT Recent Labs    07/08/22 1013  PROT 6.4*  ALBUMIN 3.0*  AST 11*  ALT 7  ALKPHOS 69  BILITOT 0.4   PT/INR Recent Labs    07/08/22 1013 07/08/22 1215  LABPROT 15.0 15.1  INR 1.2 1.2   Hepatitis Panel No results for input(s): "HEPBSAG", "HCVAB", "HEPAIGM", "HEPBIGM" in the last 72 hours. C-Diff No results for input(s): "CDIFFTOX" in the last 72 hours.  Studies/Results: ECHOCARDIOGRAM COMPLETE BUBBLE STUDY  Result Date: 07/09/2022    ECHOCARDIOGRAM REPORT   Patient Name:   [facility] A Bradner Date of Exam: 07/08/2022 Medical Rec #:  [id number]     Height:       64.0 in Accession #:    [id number]    Weight:       110.0 lb Date of Birth:  18-Aug-1956      BSA:          1.517 m Patient Age:    43 years      BP:           118/78 mmHg Patient Gender: F             HR:           79 bpm. Exam Location:  [facility] Procedure: 2D Echo, Cardiac Doppler, Color Doppler and Saline Contrast Bubble            Study Indications:    Stroke  History:        Patient has no prior history of Echocardiogram examinations.                 COPD; Risk Factors:Dyslipidemia.  Sonographer:    Clayton Lefort RDCS (AE) Referring Phys: [id number] [place]  Sonographer Comments: Technically difficult study due to poor echo windows. IMPRESSIONS  1. Left ventricular ejection fraction, by  estimation, is 60 to 65%. The left ventricle has normal function. Left ventricular endocardial border not optimally defined to evaluate regional wall motion. Left ventricular diastolic parameters are consistent with Grade I diastolic dysfunction (impaired relaxation).  2. Right ventricular systolic function is normal. The right ventricular size is normal.  Tricuspid regurgitation signal is inadequate for assessing PA pressure.  3. The mitral valve is grossly normal. No evidence of mitral valve regurgitation. No evidence of mitral stenosis.  4. The aortic valve is abnormal. Unable to determine aortic valve morphology due to image quality. There is moderate calcification of the aortic valve. Aortic valve regurgitation is not visualized. No aortic stenosis is present, however Doppler assessment for AS was suboptimal. Consider follow up echocardiogram as outpatient to reassess.  5. The inferior vena cava is normal in size with greater than 50% respiratory variability, suggesting right atrial pressure of 3 mmHg.  6. Agitated saline contrast bubble study was negative, with no evidence of any interatrial shunt. FINDINGS  Left Ventricle: Left ventricular ejection fraction, by estimation, is 60 to 65%. The left ventricle has normal function. Left ventricular endocardial border not optimally defined to evaluate regional wall motion. The left ventricular internal cavity size was normal in size. There is no left ventricular hypertrophy. Left ventricular diastolic parameters are consistent with Grade I diastolic dysfunction (impaired relaxation). Right Ventricle: The right ventricular size is normal. No increase in right ventricular wall thickness. Right ventricular systolic function is normal. Tricuspid regurgitation signal is inadequate for assessing PA pressure. Left Atrium: Left atrial size was normal in size. Right Atrium: Right atrial size was normal in size. Pericardium: There is no evidence of pericardial effusion. Mitral  Valve: The mitral valve is grossly normal. Mild mitral annular calcification. No evidence of mitral valve regurgitation. No evidence of mitral valve stenosis. Tricuspid Valve: The tricuspid valve is normal in structure. Tricuspid valve regurgitation is trivial. No evidence of tricuspid stenosis. Aortic Valve: The aortic valve is abnormal. There is moderate calcification of the aortic valve. Aortic valve regurgitation is not visualized. No aortic stenosis is present. Aortic valve mean gradient measures 3.0 mmHg. Aortic valve peak gradient measures 6.2 mmHg. Aortic valve area, by VTI measures 2.77 cm. Pulmonic Valve: The pulmonic valve was normal in structure. Pulmonic valve regurgitation is not visualized. No evidence of pulmonic stenosis. Aorta: The ascending aorta was not well visualized. Venous: The inferior vena cava is normal in size with greater than 50% respiratory variability, suggesting right atrial pressure of 3 mmHg. IAS/Shunts: No atrial level shunt detected by color flow Doppler. Agitated saline contrast was given intravenously to evaluate for intracardiac shunting. Agitated saline contrast bubble study was negative, with no evidence of any interatrial shunt.  LEFT VENTRICLE PLAX 2D LVIDd:         4.40 cm   Diastology LVIDs:         2.60 cm   LV e' medial:    6.96 cm/s LV PW:         1.00 cm   LV E/e' medial:  11.6 LV IVS:        0.90 cm   LV e' lateral:   10.80 cm/s LVOT diam:     2.10 cm   LV E/e' lateral: 7.5 LV SV:         59 LV SV Index:   39 LVOT Area:     3.46 cm  RIGHT VENTRICLE             IVC RV Basal diam:  2.70 cm     IVC diam: 1.20 cm RV S prime:     12.00 cm/s TAPSE (M-mode): 1.6 cm LEFT ATRIUM           Index        RIGHT ATRIUM  Index LA diam:      3.00 cm 1.98 cm/m   RA Area:     10.20 cm LA Vol (A2C): 18.1 ml 11.93 ml/m  RA Volume:   18.00 ml  11.86 ml/m LA Vol (A4C): 16.0 ml 10.54 ml/m  AORTIC VALVE AV Area (Vmax):    2.96 cm AV Area (Vmean):   2.78 cm AV Area (VTI):      2.77 cm AV Vmax:           124.00 cm/s AV Vmean:          81.400 cm/s AV VTI:            0.211 m AV Peak Grad:      6.2 mmHg AV Mean Grad:      3.0 mmHg LVOT Vmax:         106.00 cm/s LVOT Vmean:        65.400 cm/s LVOT VTI:          0.169 m LVOT/AV VTI ratio: 0.80  AORTA Ao Root diam: 3.00 cm Ao Asc diam:  3.00 cm MITRAL VALVE MV Area (PHT): 3.63 cm     SHUNTS MV Decel Time: 209 msec     Systemic VTI:  0.17 m MV E velocity: 81.00 cm/s   Systemic Diam: 2.10 cm MV A velocity: 107.00 cm/s MV E/A ratio:  0.76 Cherlynn Kaiser MD Electronically signed by Cherlynn Kaiser MD Signature Date/Time: 07/09/2022/11:08:56 AM    Final    US Carotid Bilateral  Result Date: 07/08/2022 CLINICAL DATA:  Speech difficulty and facial droop. Acute infarct in right posterior limb of internal capsule and posterior lentiform nucleus. EXAM: BILATERAL CAROTID DUPLEX ULTRASOUND TECHNIQUE: Pearline Cables scale imaging, color Doppler and duplex ultrasound were performed of bilateral carotid and vertebral arteries in the neck. COMPARISON:  Brain MRI 07/08/2022 FINDINGS: Criteria: Quantification of carotid stenosis is based on velocity parameters that correlate the residual internal carotid diameter with NASCET-based stenosis levels, using the diameter of the distal internal carotid lumen as the denominator for stenosis measurement. The following velocity measurements were obtained: RIGHT ICA: 139/42 cm/sec CCA: 19/50 cm/sec SYSTOLIC ICA/CCA RATIO:  1.6 ECA: 169 cm/sec LEFT ICA: 193/74 cm/sec CCA: 93/26 cm/sec SYSTOLIC ICA/CCA RATIO:  2.8 ECA: 66 cm/sec RIGHT CAROTID ARTERY: Large focus of echogenic plaque at the right carotid bulb with posterior acoustic shadowing. External carotid artery is patent with normal waveform. Peak systolic velocity in the proximal internal carotid artery is mildly elevated measuring 139 cm/sec. Mid and distal right internal carotid artery are patent but tortuous. RIGHT VERTEBRAL ARTERY: Antegrade flow and normal  waveform in the right vertebral artery. LEFT CAROTID ARTERY: Large amount of echogenic plaque and posterior acoustic shadowing at the left carotid bulb. External carotid artery is patent with normal waveform. Irregular plaque in the proximal internal carotid artery. Elevated peak systolic velocity in the proximal internal carotid artery measuring 193 cm/sec. Mid and distal left internal carotid artery are patent. LEFT VERTEBRAL ARTERY: Antegrade flow and normal waveform in the left vertebral artery. IMPRESSION: 1. Atherosclerotic plaque involving bilateral carotid arteries most prominent at the carotid bulbs. Estimated degree of stenosis in the internal carotid arteries is 50-69% bilaterally. 2. Patent vertebral arteries with antegrade flow. Electronically Signed   By: Markus Daft M.D.   On: 07/08/2022 14:24   MR BRAIN WO CONTRAST  Result Date: 07/08/2022 CLINICAL DATA:  Left facial asymmetry, slow and slurred speech EXAM: MRI HEAD WITHOUT CONTRAST MRA HEAD WITHOUT CONTRAST TECHNIQUE: Multiplanar, multi-echo pulse  sequences of the brain and surrounding structures were acquired without intravenous contrast. Angiographic images of the Circle of Willis were acquired using MRA technique without intravenous contrast. COMPARISON:  None Available. FINDINGS: MRI HEAD FINDINGS Brain: Restricted diffusion with ADC correlate in the right posterior limb of the internal capsule and posterior lentiform nucleus (series 5, image 28). No acute hemorrhage, mass, mass effect, or midline shift. No hydrocephalus or extra-axial collection. Scattered T2 hyperintense signal in the periventricular white matter, likely the sequela of mild chronic small vessel ischemic disease. Normal pituitary and craniocervical junction. Vascular: Please see MRA findings below. Skull and upper cervical spine: Normal marrow signal. Sinuses/Orbits: Mild mucosal thickening in the ethmoid air cells. Status post bilateral lens replacements. Other: Trace  fluid in the mastoid air cells. MRA HEAD FINDINGS Evaluation is somewhat limited by motion artifact. Anterior circulation: Both internal carotid arteries are patent to the termini, with mild stenosis in the right-greater-than-left cavernous ICA and supraclinoid ICA. A1 segments patent, with mild stenosis suspected in the distal right A1. Anterior cerebral arteries are somewhat irregular but patent to their distal aspects. No M1 stenosis or occlusion. Distal MCA branches are somewhat irregular but perfused to their distal aspects. Posterior circulation: Vertebral arteries patent to the vertebrobasilar junction without stenosis. Posterior inferior cerebral artery patent on the left. Prominent right AICA. Basilar patent to its distal aspect. Superior cerebellar arteries patent bilaterally. Patent P1 segments. PCAs perfused to their distal aspects without significant stenosis. The bilateral posterior communicating arteries are not visualized. Anatomic variants: None significant IMPRESSION: 1. 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 2. No intracranial large vessel occlusion. Mild stenosis in the right-greater-than-left cavernous and supraclinoid ICAs. These results were called by telephone at the time of interpretation on 07/08/2022 at 11:28 am to provider Carmin Muskrat , who verbally acknowledged these results. Electronically Signed   By: Merilyn Baba M.D.   On: 07/08/2022 11:28   MR ANGIO HEAD WO CONTRAST  Result Date: 07/08/2022 CLINICAL DATA:  Left facial asymmetry, slow and slurred speech EXAM: MRI HEAD WITHOUT CONTRAST MRA HEAD WITHOUT CONTRAST TECHNIQUE: Multiplanar, multi-echo pulse sequences of the brain and surrounding structures were acquired without intravenous contrast. Angiographic images of the Circle of Willis were acquired using MRA technique without intravenous contrast. COMPARISON:  None Available. FINDINGS: MRI HEAD FINDINGS Brain:  Restricted diffusion with ADC correlate in the right posterior limb of the internal capsule and posterior lentiform nucleus (series 5, image 28). No acute hemorrhage, mass, mass effect, or midline shift. No hydrocephalus or extra-axial collection. Scattered T2 hyperintense signal in the periventricular white matter, likely the sequela of mild chronic small vessel ischemic disease. Normal pituitary and craniocervical junction. Vascular: Please see MRA findings below. Skull and upper cervical spine: Normal marrow signal. Sinuses/Orbits: Mild mucosal thickening in the ethmoid air cells. Status post bilateral lens replacements. Other: Trace fluid in the mastoid air cells. MRA HEAD FINDINGS Evaluation is somewhat limited by motion artifact. Anterior circulation: Both internal carotid arteries are patent to the termini, with mild stenosis in the right-greater-than-left cavernous ICA and supraclinoid ICA. A1 segments patent, with mild stenosis suspected in the distal right A1. Anterior cerebral arteries are somewhat irregular but patent to their distal aspects. No M1 stenosis or occlusion. Distal MCA branches are somewhat irregular but perfused to their distal aspects. Posterior circulation: Vertebral arteries patent to the vertebrobasilar junction without stenosis. Posterior inferior cerebral artery patent on the left. Prominent right AICA. Basilar patent to  its distal aspect. Superior cerebellar arteries patent bilaterally. Patent P1 segments. PCAs perfused to their distal aspects without significant stenosis. The bilateral posterior communicating arteries are not visualized. Anatomic variants: None significant IMPRESSION: 1. 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 2. No intracranial large vessel occlusion. Mild stenosis in the right-greater-than-left cavernous and supraclinoid ICAs. These results were called by telephone at the time of interpretation on  07/08/2022 at 11:28 am to provider Carmin Muskrat , who verbally acknowledged these results. Electronically Signed   By: Merilyn Baba M.D.   On: 07/08/2022 11:28    Impression: *Acute on chronic iron deficiency anemia *Chronic GERD *Chronic diarrhea-stable  Plan: Etiology of acute on chronic anemia unclear.  Has not displayed any evidence of overt GI bleeding.  Hemoccult negative.  Discussed case with anesthesia, if patient were to need further evaluation with EGD or colonoscopy, will need to be transferred to [facility] due to risk of complications from anesthesia in the setting of her acute stroke presentation.  She has had no evidence of GI bleeding.  Continue on aspirin/Plavix.  PPI daily.  Continue to monitor H&H and transfuse for less than 7.  Imodium as needed for chronic diarrhea.   Elon Alas. Abbey Chatters, D.O. Gastroenterology and Hepatology [facility]    LOS: 0 days     07/09/2022, 1:59 PM

## 2022-07-10 ENCOUNTER — Telehealth: Payer: Self-pay | Admitting: Internal Medicine

## 2022-07-10 LAB — BPAM RBC
Blood Product Expiration Date: 202311112359
Blood Product Expiration Date: 202311112359
ISSUE DATE / TIME: 202310141054
ISSUE DATE / TIME: 202310141410
Unit Type and Rh: 5100
Unit Type and Rh: 5100

## 2022-07-10 LAB — TYPE AND SCREEN
ABO/RH(D): O POS
Antibody Screen: NEGATIVE
Unit division: 0
Unit division: 0

## 2022-07-10 LAB — BASIC METABOLIC PANEL
Anion gap: 5 (ref 5–15)
BUN: 8 mg/dL (ref 8–23)
CO2: 20 mmol/L — ABNORMAL LOW (ref 22–32)
Calcium: 7.7 mg/dL — ABNORMAL LOW (ref 8.9–10.3)
Chloride: 115 mmol/L — ABNORMAL HIGH (ref 98–111)
Creatinine, Ser: 0.69 mg/dL (ref 0.44–1.00)
GFR, Estimated: >60 mL/min (ref >60)
Glucose, Bld: 114 mg/dL — ABNORMAL HIGH (ref 70–99)
Potassium: 3.3 mmol/L — ABNORMAL LOW (ref 3.5–5.1)
Sodium: 140 mmol/L (ref 135–145)

## 2022-07-10 LAB — CBC
HCT: 31.9 % — ABNORMAL LOW (ref 36.0–46.0)
Hemoglobin: 10.1 g/dL — ABNORMAL LOW (ref 12.0–15.0)
MCH: 28.5 pg (ref 26.0–34.0)
MCHC: 31.7 g/dL (ref 30.0–36.0)
MCV: 90.1 fL (ref 80.0–100.0)
Platelets: 292 10*3/uL (ref 150–400)
RBC: 3.54 MIL/uL — ABNORMAL LOW (ref 3.87–5.11)
RDW: 16.1 % — ABNORMAL HIGH (ref 11.5–15.5)
WBC: 6.4 10*3/uL (ref 4.0–10.5)
nRBC: 0 % (ref 0.0–0.2)

## 2022-07-10 LAB — GLUCOSE, CAPILLARY
Glucose-Capillary: 104 mg/dL — ABNORMAL HIGH (ref 70–99)
Glucose-Capillary: 169 mg/dL — ABNORMAL HIGH (ref 70–99)

## 2022-07-10 LAB — T3, FREE: T3, Free: 1.7 pg/mL — ABNORMAL LOW (ref 2.0–4.4)

## 2022-07-10 MED ORDER — CLOPIDOGREL BISULFATE 75 MG PO TABS: 75.0000 mg | ORAL_TABLET | Freq: Every day | ORAL | 0 refills | Status: AC

## 2022-07-10 MED ORDER — FERROUS SULFATE 325 (65 FE) MG PO TABS: 325.0000 mg | ORAL_TABLET | Freq: Two times a day (BID) | ORAL | Status: DC

## 2022-07-10 MED ORDER — ASPIRIN 81 MG PO TBEC: 81.0000 mg | DELAYED_RELEASE_TABLET | Freq: Every day | ORAL | 12 refills | Status: AC

## 2022-07-10 MED ORDER — FERROUS SULFATE 325 (65 FE) MG PO TABS: 325.0000 mg | ORAL_TABLET | Freq: Two times a day (BID) | ORAL | 1 refills | Status: DC

## 2022-07-10 MED ORDER — POTASSIUM CHLORIDE CRYS ER 20 MEQ PO TBCR
40.0000 meq | EXTENDED_RELEASE_TABLET | Freq: Once | ORAL | Status: AC
  Administered 2022-07-10: 40 meq via ORAL
  Filled 2022-07-10: qty 2

## 2022-07-10 MED ORDER — SULFASALAZINE 500 MG PO TBEC
500.0000 mg | DELAYED_RELEASE_TABLET | Freq: Two times a day (BID) | ORAL | Status: DC
  Administered 2022-07-10: 500 mg via ORAL
  Filled 2022-07-10 (×3): qty 1

## 2022-07-10 MED ORDER — PRAVASTATIN SODIUM 80 MG PO TABS: 80.0000 mg | ORAL_TABLET | Freq: Every day | ORAL | 0 refills | Status: DC

## 2022-07-10 NOTE — Telephone Encounter (Signed)
Please arrange hospital follow-up visit with Magda Paganini in 2 to 4 weeks.  Next step would likely be setting up capsule endoscopy.  Thank you

## 2022-07-10 NOTE — Progress Notes (Signed)
Pt had an uneventful night.Left sided facial asymmetry still present and left sided upper and lower extremity weakness still present. No drift noted. NIH and q 4 neuro checks have remained unchanged throughout the night. Erica Beasley Erica Beasley

## 2022-07-10 NOTE — Discharge Summary (Signed)
Physician Discharge Summary [facility]    Patient: Erica Beasley                   Admit date: 07/08/2022   DOB: 1956-08-15             Discharge date:07/10/2022/11:05 AM ESP:[id number]                          PCP: Loman Brooklyn, FNP  Disposition:   HOME   Recommendations for Outpatient Follow-up:   Follow up: With neurologist within 1-3 weeks (current recommendation to continue aspirin + Plavix for 3 weeks -plus high-dose statin) Follow with a PCP in 1 week -Follow with a gastroenterologist in 2-4 weeks  -Follow-up with hematologist Dr. Delton Coombes in 2-4 weeks for further evaluation and work-up of anemia  Discharge Condition: Stable   Code Status:   Code Status: Full Code  Diet recommendation: Regular healthy diet   Discharge Diagnoses:    Principal Problem:   Stroke [facility]) Active Problems:   IDA (iron deficiency anemia)   Hypothyroidism   Rheumatoid arthritis involving multiple sites ([place])   Mixed hyperlipidemia   Depression, recurrent ([place])   GERD (gastroesophageal reflux disease)   Chronic diarrhea   Stroke (cerebrum) ([place])   History of Present Illness/ Hospital Course Kathleen Argue Summary:    Erica Beasley is a 66 year old female with extensive history of rheumatoid arthritis, depression, GERD, hypothyroidism, chronic diarrhea, osteoporosis,?  COPD, chronic iron deficiency anemia, presenting with acute onset of speech difficulty, facial asymmetry, drooling since yesterday. Denies of any extremity weakness, denies any vision, no prior history of stroke    Assessment and Plan: * Stroke ([place]) - Acute CVA onset yesterday with persistent dysarthria, facial asymmetry -MRI of the brain: IMPRESSION: 1. 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 2. No intracranial large vessel occlusion. Mild stenosis in the right-greater-than-left cavernous and supraclinoid ICAs.      -On-call neurologist Dr.  Hortense Ramal -2D echocardiogram,  -  LDL: 80, HDL,36, TG 100 - A1c: 4.6  -Neurochecks: Improve aphasia, no changes of facial asymmetry -PT/OT/speech -outpatient speech therapy  -Patient has been initiated on aspirin, Plavix, statin  -Neurology recommending aspirin 81 mg + Plavix 75 mg p.o. daily x3 weeks then continue aspirin, continue high-dose statins  IDA (iron deficiency anemia) - Monitoring hemoglobin, dropped from 8.5->> 6.6 >> s/p 2U PRBC >>> 10.1 today -Total iron 20, TIBC 134, saturation ratio 15 -Hemoccult negative -Total iron 1120, TIBC 134 -Initiated 2 dose of IV iron, and continue with the p.o. iron supplements   Mixed hyperlipidemia - Initiating high-dose statin Lipitor 80 mg daily  Rheumatoid arthritis involving multiple sites (HCC) Continue home medication including Voltaren, Plaquenil,  Hypothyroidism -TSH 21.4 --Free T4: 1.28 -  continue home dose of Synthroid   GERD (gastroesophageal reflux disease) - On Protonix,   Depression, recurrent (HCC) - Stable continue home medication of Celexa and trazodone  Chronic diarrhea - Currently stable, continue as needed Imodium     Discharge Instructions:   Discharge Instructions     Activity as tolerated - No restrictions   Complete by: As directed    Call MD for:  difficulty breathing, headache or visual disturbances   Complete by: As directed    Call MD for:  persistant dizziness or light-headedness   Complete by: As directed    Call MD for:  persistant nausea and vomiting  Complete by: As directed    Diet - low sodium heart healthy   Complete by: As directed    Discharge instructions   Complete by: As directed    -Follow-up with a neurologist in 1-2 weeks (Current recommendation to continue aspirin + Plavix x3 weeks then continue with aspirin-high-dose statins) -Follow with a PCP in 1 week -Follow with a gastroenterologist in 2-4 weeks -Follow-up with hematologist Dr. Delton Coombes in 2-4 weeks for  further evaluation and work-up of anemia   Increase activity slowly   Complete by: As directed       Allergies as of 07/10/2022       Reactions   Upadacitinib Other (See Comments)        Medication List     TAKE these medications    albuterol 108 (90 Base) MCG/ACT inhaler Commonly known as: VENTOLIN HFA Inhale 2 puffs into the lungs every 6 (six) hours as needed for wheezing or shortness of breath.   aspirin EC 81 MG tablet Take 1 tablet (81 mg total) by mouth daily. Swallow whole. Start taking on: July 11, 2022   calcium carbonate 1500 (600 Ca) MG Tabs tablet Commonly known as: OSCAL Take 1,500 mg by mouth daily with breakfast.   citalopram 40 MG tablet Commonly known as: CELEXA TAKE ONE TABLET ONCE DAILY   clopidogrel 75 MG tablet Commonly known as: PLAVIX Take 1 tablet (75 mg total) by mouth daily for 21 days. Start taking on: July 11, 2022   diclofenac Sodium 1 % Gel Commonly known as: Voltaren Apply 4 g topically 4 (four) times daily.   ferrous sulfate 325 (65 FE) MG tablet Take 1 tablet (325 mg total) by mouth 2 (two) times daily with a meal. Start taking on: July 12, 9891   folic acid 1 MG tablet Commonly known as: FOLVITE Take 1 tablet (1 mg total) by mouth daily.   gabapentin 300 MG capsule Commonly known as: NEURONTIN Take 1 capsule (300 mg total) by mouth 3 (three) times daily.   hydroxychloroquine 200 MG tablet Commonly known as: PLAQUENIL Take 200 mg by mouth daily.   leflunomide 20 MG tablet Commonly known as: ARAVA Take 20 mg by mouth daily.   levothyroxine 125 MCG tablet Commonly known as: SYNTHROID Take 1 tablet (125 mcg total) by mouth every morning.   loperamide 2 MG tablet Commonly known as: IMODIUM A-D Take 1 tablet (2 mg total) by mouth in the morning. Take second dose if needed What changed:  when to take this reasons to take this   megestrol 400 MG/10ML suspension Commonly known as: MEGACE Take 10 mLs (400  mg total) by mouth 2 (two) times daily.   pantoprazole 40 MG tablet Commonly known as: PROTONIX TAKE ONE TABLET BY MOUTH DAILY BEFORE BREAKFAST What changed: See the new instructions.   potassium chloride SA 20 MEQ tablet Commonly known as: KLOR-CON M TAKE ONE TABLET ONCE DAILY, NEED DOCTOR APPT. What changed: See the new instructions.   pravastatin 80 MG tablet Commonly known as: PRAVACHOL Take 1 tablet (80 mg total) by mouth daily at 6 PM. What changed:  medication strength how much to take when to take this   sulfaSALAzine 500 MG tablet Commonly known as: AZULFIDINE Take 500 mg by mouth 2 (two) times daily.   traZODone 100 MG tablet Commonly known as: DESYREL Take 1 tablet (100 mg total) by mouth at bedtime as needed. for sleep What changed: when to take this  Allergies  Allergen Reactions   Upadacitinib Other (See Comments)     Quit smoking:  It is highly recommended that all people especially deals with diabetes to quit smoking or stay away from smoking, avoid secondhand smoking.   Vaccines:  Also highly recommended update your vaccines requirement, including SARS-CoV-2 , yearly  flu vaccine and pneumonia vaccine at least every 5 years.      Exercise: If you are able: 30 -60 minutes a day, 4 days a week, or 150 minutes a week.  The longer the better.  Combine stretch, strength, and aerobic activities.  If you were told in the past that you have high risk for cardiovascular diseases, you may seek evaluation by your heart doctor prior to initiating moderate to intense exercise programs.    One other important lifestyle recommendation is to ensure adequate sleep - at least 6-7 hours of uninterrupted sleep at night.  Procedures /Studies:   ECHOCARDIOGRAM COMPLETE BUBBLE STUDY  Result Date: 07/09/2022    ECHOCARDIOGRAM REPORT   Patient Name:   [facility] Date of Exam: 07/08/2022 Medical Rec #:  [id number]     Height:       64.0 in Accession #:     [id number]    Weight:       110.0 lb Date of Birth:  05/13/1956      BSA:          1.517 m Patient Age:    64 years      BP:           118/78 mmHg Patient Gender: F             HR:           79 bpm. Exam Location:  [facility] Procedure: 2D Echo, Cardiac Doppler, Color Doppler and Saline Contrast Bubble            Study Indications:    Stroke  History:        Patient has no prior history of Echocardiogram examinations.                 COPD; Risk Factors:Dyslipidemia.  Sonographer:    Clayton Lefort RDCS (AE) Referring Phys: [id number] [place]  Sonographer Comments: Technically difficult study due to poor echo windows. IMPRESSIONS  1. Left ventricular ejection fraction, by estimation, is 60 to 65%. The left ventricle has normal function. Left ventricular endocardial border not optimally defined to evaluate regional wall motion. Left ventricular diastolic parameters are consistent with Grade I diastolic dysfunction (impaired relaxation).  2. Right ventricular systolic function is normal. The right ventricular size is normal. Tricuspid regurgitation signal is inadequate for assessing PA pressure.  3. The mitral valve is grossly normal. No evidence of mitral valve regurgitation. No evidence of mitral stenosis.  4. The aortic valve is abnormal. Unable to determine aortic valve morphology due to image quality. There is moderate calcification of the aortic valve. Aortic valve regurgitation is not visualized. No aortic stenosis is present, however Doppler assessment for AS was suboptimal. Consider follow up echocardiogram as outpatient to reassess.  5. The inferior vena cava is normal in size with greater than 50% respiratory variability, suggesting right atrial pressure of 3 mmHg.  6. Agitated saline contrast bubble study was negative, with no evidence of any interatrial shunt. FINDINGS  Left Ventricle: Left ventricular ejection fraction, by estimation, is 60 to 65%. The left ventricle has normal function. Left ventricular  endocardial border not optimally defined to evaluate  regional wall motion. The left ventricular internal cavity size was normal in size. There is no left ventricular hypertrophy. Left ventricular diastolic parameters are consistent with Grade I diastolic dysfunction (impaired relaxation). Right Ventricle: The right ventricular size is normal. No increase in right ventricular wall thickness. Right ventricular systolic function is normal. Tricuspid regurgitation signal is inadequate for assessing PA pressure. Left Atrium: Left atrial size was normal in size. Right Atrium: Right atrial size was normal in size. Pericardium: There is no evidence of pericardial effusion. Mitral Valve: The mitral valve is grossly normal. Mild mitral annular calcification. No evidence of mitral valve regurgitation. No evidence of mitral valve stenosis. Tricuspid Valve: The tricuspid valve is normal in structure. Tricuspid valve regurgitation is trivial. No evidence of tricuspid stenosis. Aortic Valve: The aortic valve is abnormal. There is moderate calcification of the aortic valve. Aortic valve regurgitation is not visualized. No aortic stenosis is present. Aortic valve mean gradient measures 3.0 mmHg. Aortic valve peak gradient measures 6.2 mmHg. Aortic valve area, by VTI measures 2.77 cm. Pulmonic Valve: The pulmonic valve was normal in structure. Pulmonic valve regurgitation is not visualized. No evidence of pulmonic stenosis. Aorta: The ascending aorta was not well visualized. Venous: The inferior vena cava is normal in size with greater than 50% respiratory variability, suggesting right atrial pressure of 3 mmHg. IAS/Shunts: No atrial level shunt detected by color flow Doppler. Agitated saline contrast was given intravenously to evaluate for intracardiac shunting. Agitated saline contrast bubble study was negative, with no evidence of any interatrial shunt.  LEFT VENTRICLE PLAX 2D LVIDd:         4.40 cm   Diastology LVIDs:          2.60 cm   LV e' medial:    6.96 cm/s LV PW:         1.00 cm   LV E/e' medial:  11.6 LV IVS:        0.90 cm   LV e' lateral:   10.80 cm/s LVOT diam:     2.10 cm   LV E/e' lateral: 7.5 LV SV:         59 LV SV Index:   39 LVOT Area:     3.46 cm  RIGHT VENTRICLE             IVC RV Basal diam:  2.70 cm     IVC diam: 1.20 cm RV S prime:     12.00 cm/s TAPSE (M-mode): 1.6 cm LEFT ATRIUM           Index        RIGHT ATRIUM           Index LA diam:      3.00 cm 1.98 cm/m   RA Area:     10.20 cm LA Vol (A2C): 18.1 ml 11.93 ml/m  RA Volume:   18.00 ml  11.86 ml/m LA Vol (A4C): 16.0 ml 10.54 ml/m  AORTIC VALVE AV Area (Vmax):    2.96 cm AV Area (Vmean):   2.78 cm AV Area (VTI):     2.77 cm AV Vmax:           124.00 cm/s AV Vmean:          81.400 cm/s AV VTI:            0.211 m AV Peak Grad:      6.2 mmHg AV Mean Grad:      3.0 mmHg LVOT Vmax:  106.00 cm/s LVOT Vmean:        65.400 cm/s LVOT VTI:          0.169 m LVOT/AV VTI ratio: 0.80  AORTA Ao Root diam: 3.00 cm Ao Asc diam:  3.00 cm MITRAL VALVE MV Area (PHT): 3.63 cm     SHUNTS MV Decel Time: 209 msec     Systemic VTI:  0.17 m MV E velocity: 81.00 cm/s   Systemic Diam: 2.10 cm MV A velocity: 107.00 cm/s MV E/A ratio:  0.76 Cherlynn Kaiser MD Electronically signed by Cherlynn Kaiser MD Signature Date/Time: 07/09/2022/11:08:56 AM    Final    US Carotid Bilateral  Result Date: 07/08/2022 CLINICAL DATA:  Speech difficulty and facial droop. Acute infarct in right posterior limb of internal capsule and posterior lentiform nucleus. EXAM: BILATERAL CAROTID DUPLEX ULTRASOUND TECHNIQUE: Pearline Cables scale imaging, color Doppler and duplex ultrasound were performed of bilateral carotid and vertebral arteries in the neck. COMPARISON:  Brain MRI 07/08/2022 FINDINGS: Criteria: Quantification of carotid stenosis is based on velocity parameters that correlate the residual internal carotid diameter with NASCET-based stenosis levels, using the diameter of the distal internal  carotid lumen as the denominator for stenosis measurement. The following velocity measurements were obtained: RIGHT ICA: 139/42 cm/sec CCA: 40/97 cm/sec SYSTOLIC ICA/CCA RATIO:  1.6 ECA: 169 cm/sec LEFT ICA: 193/74 cm/sec CCA: 35/32 cm/sec SYSTOLIC ICA/CCA RATIO:  2.8 ECA: 66 cm/sec RIGHT CAROTID ARTERY: Large focus of echogenic plaque at the right carotid bulb with posterior acoustic shadowing. External carotid artery is patent with normal waveform. Peak systolic velocity in the proximal internal carotid artery is mildly elevated measuring 139 cm/sec. Mid and distal right internal carotid artery are patent but tortuous. RIGHT VERTEBRAL ARTERY: Antegrade flow and normal waveform in the right vertebral artery. LEFT CAROTID ARTERY: Large amount of echogenic plaque and posterior acoustic shadowing at the left carotid bulb. External carotid artery is patent with normal waveform. Irregular plaque in the proximal internal carotid artery. Elevated peak systolic velocity in the proximal internal carotid artery measuring 193 cm/sec. Mid and distal left internal carotid artery are patent. LEFT VERTEBRAL ARTERY: Antegrade flow and normal waveform in the left vertebral artery. IMPRESSION: 1. Atherosclerotic plaque involving bilateral carotid arteries most prominent at the carotid bulbs. Estimated degree of stenosis in the internal carotid arteries is 50-69% bilaterally. 2. Patent vertebral arteries with antegrade flow. Electronically Signed   By: Markus Daft M.D.   On: 07/08/2022 14:24   MR BRAIN WO CONTRAST  Result Date: 07/08/2022 CLINICAL DATA:  Left facial asymmetry, slow and slurred speech EXAM: MRI HEAD WITHOUT CONTRAST MRA HEAD WITHOUT CONTRAST TECHNIQUE: Multiplanar, multi-echo pulse sequences of the brain and surrounding structures were acquired without intravenous contrast. Angiographic images of the Circle of Willis were acquired using MRA technique without intravenous contrast. COMPARISON:  None Available.  FINDINGS: MRI HEAD FINDINGS Brain: Restricted diffusion with ADC correlate in the right posterior limb of the internal capsule and posterior lentiform nucleus (series 5, image 28). No acute hemorrhage, mass, mass effect, or midline shift. No hydrocephalus or extra-axial collection. Scattered T2 hyperintense signal in the periventricular white matter, likely the sequela of mild chronic small vessel ischemic disease. Normal pituitary and craniocervical junction. Vascular: Please see MRA findings below. Skull and upper cervical spine: Normal marrow signal. Sinuses/Orbits: Mild mucosal thickening in the ethmoid air cells. Status post bilateral lens replacements. Other: Trace fluid in the mastoid air cells. MRA HEAD FINDINGS Evaluation is somewhat limited by motion artifact. Anterior circulation: Both  internal carotid arteries are patent to the termini, with mild stenosis in the right-greater-than-left cavernous ICA and supraclinoid ICA. A1 segments patent, with mild stenosis suspected in the distal right A1. Anterior cerebral arteries are somewhat irregular but patent to their distal aspects. No M1 stenosis or occlusion. Distal MCA branches are somewhat irregular but perfused to their distal aspects. Posterior circulation: Vertebral arteries patent to the vertebrobasilar junction without stenosis. Posterior inferior cerebral artery patent on the left. Prominent right AICA. Basilar patent to its distal aspect. Superior cerebellar arteries patent bilaterally. Patent P1 segments. PCAs perfused to their distal aspects without significant stenosis. The bilateral posterior communicating arteries are not visualized. Anatomic variants: None significant IMPRESSION: 1. 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 2. No intracranial large vessel occlusion. Mild stenosis in the right-greater-than-left cavernous and supraclinoid ICAs. These results were called by telephone at the  time of interpretation on 07/08/2022 at 11:28 am to provider Carmin Muskrat , who verbally acknowledged these results. Electronically Signed   By: Merilyn Baba M.D.   On: 07/08/2022 11:28   MR ANGIO HEAD WO CONTRAST  Result Date: 07/08/2022 CLINICAL DATA:  Left facial asymmetry, slow and slurred speech EXAM: MRI HEAD WITHOUT CONTRAST MRA HEAD WITHOUT CONTRAST TECHNIQUE: Multiplanar, multi-echo pulse sequences of the brain and surrounding structures were acquired without intravenous contrast. Angiographic images of the Circle of Willis were acquired using MRA technique without intravenous contrast. COMPARISON:  None Available. FINDINGS: MRI HEAD FINDINGS Brain: Restricted diffusion with ADC correlate in the right posterior limb of the internal capsule and posterior lentiform nucleus (series 5, image 28). No acute hemorrhage, mass, mass effect, or midline shift. No hydrocephalus or extra-axial collection. Scattered T2 hyperintense signal in the periventricular white matter, likely the sequela of mild chronic small vessel ischemic disease. Normal pituitary and craniocervical junction. Vascular: Please see MRA findings below. Skull and upper cervical spine: Normal marrow signal. Sinuses/Orbits: Mild mucosal thickening in the ethmoid air cells. Status post bilateral lens replacements. Other: Trace fluid in the mastoid air cells. MRA HEAD FINDINGS Evaluation is somewhat limited by motion artifact. Anterior circulation: Both internal carotid arteries are patent to the termini, with mild stenosis in the right-greater-than-left cavernous ICA and supraclinoid ICA. A1 segments patent, with mild stenosis suspected in the distal right A1. Anterior cerebral arteries are somewhat irregular but patent to their distal aspects. No M1 stenosis or occlusion. Distal MCA branches are somewhat irregular but perfused to their distal aspects. Posterior circulation: Vertebral arteries patent to the vertebrobasilar junction without  stenosis. Posterior inferior cerebral artery patent on the left. Prominent right AICA. Basilar patent to its distal aspect. Superior cerebellar arteries patent bilaterally. Patent P1 segments. PCAs perfused to their distal aspects without significant stenosis. The bilateral posterior communicating arteries are not visualized. Anatomic variants: None significant IMPRESSION: 1. 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 2. No intracranial large vessel occlusion. Mild stenosis in the right-greater-than-left cavernous and supraclinoid ICAs. These results were called by telephone at the time of interpretation on 07/08/2022 at 11:28 am to provider Carmin Muskrat , who verbally acknowledged these results. Electronically Signed   By: Merilyn Baba M.D.   On: 07/08/2022 11:28    Subjective:   Patient was seen and examined 07/10/2022, 11:05 AM Patient stable today. No acute distress.  No issues overnight Stable for discharge.  Discharge Exam:    Vitals:   07/09/22 1705 07/09/22 2056 07/10/22  0553 07/10/22 0700  BP: 115/72 120/72 130/70 133/80  Pulse: 67 69 72 70  Resp: '18 19 19 18  '$ Temp: 98.2 F (36.8 C) 98.1 F (36.7 C) 98.3 F (36.8 C) 97.7 F (36.5 C)  TempSrc: Oral   Oral  SpO2: 99% 96% 95% 98%  Weight:   49.4 kg   Height:        General: Pt lying comfortably in bed & appears in no obvious distress. Cardiovascular: S1 & S2 heard, RRR, S1/S2 +. No murmurs, rubs, gallops or clicks. No JVD or pedal edema. Respiratory: Clear to auscultation without wheezing, rhonchi or crackles. No increased work of breathing. Abdominal:  Non-distended, non-tender & soft. No organomegaly or masses appreciated. Normal bowel sounds heard. CNS: Alert and oriented. No focal deficits. Extremities: no edema, no cyanosis      The results of significant diagnostics from this hospitalization (including imaging, microbiology, ancillary and laboratory) are  listed below for reference.      Microbiology:   No results found for this or any previous visit (from the past 240 hour(s)).   Labs:   CBC: Recent Labs  Lab 07/08/22 1013 07/08/22 1016 07/09/22 0457 07/09/22 0819 07/10/22 0510  WBC 7.2  --  5.0  --  6.4  NEUTROABS 4.8  --   --   --   --   HGB 8.4* 9.2* 6.6* 6.7* 10.1*  HCT 29.3* 27.0* 22.8* 23.4* 31.9*  MCV 93.0  --  94.2  --  90.1  PLT 347  --  293  --  161   Basic Metabolic Panel: Recent Labs  Lab 07/08/22 1013 07/08/22 1016 07/08/22 1215 07/09/22 0457 07/10/22 0510  NA 139 139  --  140 140  K 3.8 3.8  --  3.4* 3.3*  CL 109 108  --  115* 115*  CO2 20*  --   --  20* 20*  GLUCOSE 156* 152*  --  109* 114*  BUN 10 7*  --  7* 8  CREATININE 0.89 0.90  --  0.72 0.69  CALCIUM 8.5*  --   --  7.5* 7.7*  MG  --   --  1.9  --   --   PHOS  --   --  3.4  --   --    Liver Function Tests: Recent Labs  Lab 07/08/22 1013  AST 11*  ALT 7  ALKPHOS 69  BILITOT 0.4  PROT 6.4*  ALBUMIN 3.0*   BNP (last 3 results) Recent Labs    07/08/22 1215  BNP 28.0   Cardiac Enzymes: No results for input(s): "CKTOTAL", "CKMB", "CKMBINDEX", "TROPONINI" in the last 168 hours. CBG: Recent Labs  Lab 07/08/22 1009 07/09/22 0811 07/10/22 0721  GLUCAP 124* 95 104*   Hgb A1c Recent Labs    07/08/22 1149  HGBA1C 4.6*   Lipid Profile Recent Labs    07/08/22 1215  CHOL 136  HDL 36*  LDLCALC 80  TRIG 100  CHOLHDL 3.8   Thyroid function studies Recent Labs    07/08/22 1149  TSH 21.462*   Anemia work up Recent Labs    07/09/22 0819  TIBC 134*  IRON 20*   Urinalysis    Component Value Date/Time   COLORURINE YELLOW 07/08/2022 [place] 07/08/2022 2312   LABSPEC 1.009 07/08/2022 2312   PHURINE 6.0 07/08/2022 [place] 07/08/2022 [place] NEGATIVE 07/08/2022 [place] 07/08/2022 2312   KETONESUR NEGATIVE 07/08/2022  [place] 07/08/2022 2312    NITRITE NEGATIVE 07/08/2022 2312   LEUKOCYTESUR SMALL (A) 07/08/2022 2312         Time coordinating discharge: Over 45 minutes  SIGNED: Deatra James, MD, FACP, [facility]. [facility],  Please use [email or web address] to Page If 7PM-7AM, please contact night-coverage [email or web address],  07/10/2022, 11:05 AM

## 2022-07-11 ENCOUNTER — Telehealth: Payer: Self-pay

## 2022-07-11 ENCOUNTER — Other Ambulatory Visit: Payer: Medicare Other

## 2022-07-11 ENCOUNTER — Telehealth: Payer: Self-pay | Admitting: *Deleted

## 2022-07-11 ENCOUNTER — Encounter: Payer: Self-pay | Admitting: *Deleted

## 2022-07-11 ENCOUNTER — Telehealth: Payer: Self-pay | Admitting: Family Medicine

## 2022-07-11 ENCOUNTER — Inpatient Hospital Stay: Payer: Medicare Other

## 2022-07-11 NOTE — Patient Outreach (Signed)
  Care Coordination TOC Note Transition Care Management Follow-up Telephone Call Date of discharge and from where: [facility] 07/10/22 How have you been since you were released from the hospital? Per daughter, "She seems to be doing well. She's a little slower than usual and hard headed. I had to take her keys because she's not supposed to drive and she wants to. She's used to being independent." Any questions or concerns? No  Items Reviewed: Did the pt receive and understand the discharge instructions provided? Yes  Medications obtained and verified? Yes . ASA 81 mg daily and Plavix '75mg'$  daily for 21 days Other? Yes . Discussed that patient is used to being independent and isn't happy that she can't drive. Daughter is going to take FMLA leave for a while to take care of her and take her to appointments. Discussed patient's s/s of CVA. Slurred speech and facial drooping on one side.  Any new allergies since your discharge? No  Dietary orders reviewed? Yes Do you have support at home? Yes   Home Care and Equipment/Supplies: Were home health services ordered? no If so, what is the name of the agency? N/a  Has the agency set up a time to come to the patient's home? not applicable Were any new equipment or medical supplies ordered?  No What is the name of the medical supply agency? N/a Were you able to get the supplies/equipment? not applicable Do you have any questions related to the use of the equipment or supplies? No  Functional Questionnaire: (I = Independent and D = Dependent) ADLs: I (except not cleared for driving)  Bathing/Dressing- I (has a shower chair)  Meal Prep- I  Eating- I  Maintaining continence- I  Transferring/Ambulation- I  Managing Meds- I  Follow up appointments reviewed:  PCP Hospital f/u appt confirmed?  Requested [facility] Triage nurse to call and schedule   [facility] f/u appt confirmed? Yes  Scheduled to see Tarri Abernethy, PA (hematology) on  07/28/22 @ 9:00, Neil Crouch, PA (GI) on 08/02/22 at 9:30, Dr April Manson (neuro) 08/04/22 at 8:15 (on waiting list for cancellation) Are transportation arrangements needed? No  If their condition worsens, is the pt aware to call PCP or go to the Emergency Dept.? Yes Was the patient provided with contact information for the PCP's office or ED? Yes Was to pt encouraged to call back with questions or concerns? Yes  SDOH assessments and interventions completed:   Yes  Care Coordination Interventions Activated:  Yes   Care Coordination Interventions:  PCP follow up appointment requested Provided information on [place] services and encouraged to reach out if services are needed.    Encounter Outcome:  Pt. Visit Completed    Chong Sicilian, BSN, RN-BC RN Care Coordinator [place] Direct Dial: [phone] Main #: [phone]

## 2022-07-11 NOTE — Telephone Encounter (Signed)
Please call patients daughter to schedule pt a hospital follow up for stroke.

## 2022-07-11 NOTE — Telephone Encounter (Signed)
Hospital follow appt made for 07/12/2022

## 2022-07-11 NOTE — Telephone Encounter (Signed)
Called patient with appt date and time. She wanted me to speak to her daughter, I told her the appt date and time, then phone kept going in and out and then disconnected. I tried calling back and phone said VM had not been set up to leave messages, I mailed her appt card.

## 2022-07-12 ENCOUNTER — Encounter: Payer: Self-pay | Admitting: Family Medicine

## 2022-07-12 ENCOUNTER — Ambulatory Visit (INDEPENDENT_AMBULATORY_CARE_PROVIDER_SITE_OTHER): Payer: Medicare Other | Admitting: Family Medicine

## 2022-07-12 ENCOUNTER — Telehealth: Payer: Self-pay | Admitting: Family Medicine

## 2022-07-12 VITALS — BP 127/78 | HR 84 | Temp 97.7°F | Ht 64.0 in | Wt 109.8 lb

## 2022-07-12 DIAGNOSIS — I6930 Unspecified sequelae of cerebral infarction: Secondary | ICD-10-CM

## 2022-07-12 DIAGNOSIS — G8929 Other chronic pain: Secondary | ICD-10-CM

## 2022-07-12 DIAGNOSIS — I700 Atherosclerosis of aorta: Secondary | ICD-10-CM | POA: Diagnosis not present

## 2022-07-12 DIAGNOSIS — R269 Unspecified abnormalities of gait and mobility: Secondary | ICD-10-CM | POA: Diagnosis not present

## 2022-07-12 DIAGNOSIS — M545 Low back pain, unspecified: Secondary | ICD-10-CM

## 2022-07-12 DIAGNOSIS — J449 Chronic obstructive pulmonary disease, unspecified: Secondary | ICD-10-CM

## 2022-07-12 MED ORDER — ROSUVASTATIN CALCIUM 40 MG PO TABS: 40.0000 mg | ORAL_TABLET | Freq: Every day | ORAL | 3 refills | Status: DC

## 2022-07-12 NOTE — Telephone Encounter (Signed)
Patient would like to switch from Erica Beasley as her PCP to Dr. Livia Snellen. Please call to let them know when decision is made.

## 2022-07-12 NOTE — Progress Notes (Signed)
Subjective:  Patient ID: Erica Beasley, female    DOB: 20-Sep-1956  Age: 66 y.o. MRN: [id number]  CC: Hospitalization Follow-up   HPI Erica Beasley presents for TOC. Admitted for stroke.  In [place] 10-13 to 10-15. SEEING NP, Erica Beasley ON 11/9. Couldn't speak. LEft side is weak. Not completely. Still has gross movement. Can walk about 50 feet. Unsteady though. Still has difficulty talking speech is slurred.      07/12/2022   10:37 AM 06/29/2022   12:48 PM 05/24/2022    2:04 PM  Depression screen PHQ 2/9  Decreased Interest 0 3 0  Down, Depressed, Hopeless 0 0 0  PHQ - 2 Score 0 3 0  Altered sleeping  0 0  Tired, decreased energy  3 0  Change in appetite  0 0  Feeling bad or failure about yourself   0 0  Trouble concentrating  0 0  Moving slowly or fidgety/restless  0 0  Suicidal thoughts  0 0  PHQ-9 Score  6 0  Difficult doing work/chores   Not difficult at all    History Erica Beasley has a past medical history of Aortic atherosclerosis ([place]) (11/13/2020), CKD (chronic kidney disease) stage 2, GFR 60-89 ml/min, COPD (chronic obstructive pulmonary disease) ([place]), COVID-19 (06/2019), Depression, Essential hypertension, GERD (gastroesophageal reflux disease), H/O Clostridium difficile infection (05/03/2019), History of diabetes mellitus, colonic polyps (05/03/2019), Hyperlipidemia, Hypothyroidism, Neuropathy, Rheumatoid arthritis ([place]), 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   She has a past surgical history that includes Yag laser application (Left, 75/91/6384); Colonoscopy (09/2010); Colonoscopy with propofol (N/A, 09/16/2019); biopsy (09/16/2019); Esophagogastroduodenoscopy (egd) with propofol (N/A, 04/28/2020); biopsy (04/28/2020); Cataract extraction (Bilateral); Colonoscopy with propofol (N/A, 06/08/2020); and biopsy (06/08/2020).   Her family history includes Breast cancer in her mother; Diabetes in her mother; Heart  disease in her mother; Migraines in her daughter; Other in her father; Stroke in her mother.She reports that she quit smoking about 4 years ago. Her smoking use included cigarettes. She has a 15.00 pack-year smoking history. She has quit using smokeless tobacco. She reports that she does not drink alcohol and does not use drugs.    ROS Review of Systems  Constitutional:  Positive for activity change (unable to do as much due to stroke).  HENT:  Negative for congestion.   Eyes:  Negative for visual disturbance.  Respiratory:  Positive for shortness of breath (mild, related to history of smoking).   Cardiovascular:  Negative for chest pain.  Gastrointestinal:  Negative for abdominal pain, constipation, diarrhea, nausea and vomiting.  Genitourinary:  Negative for difficulty urinating.  Musculoskeletal:  Positive for arthralgias. Negative for myalgias.  Neurological:  Negative for headaches.  Psychiatric/Behavioral:  Negative for agitation, behavioral problems and sleep disturbance.     Objective:  BP 127/78   Pulse 84   Temp 97.7 F (36.5 C)   Ht '5\' 4"'$  (1.626 m)   Wt 109 lb 12.8 oz (49.8 kg)   SpO2 97%   BMI 18.85 kg/m   BP Readings from Last 3 Encounters:  07/12/22 127/78  07/10/22 (!) 110/58  06/29/22 117/73    Wt Readings from Last 3 Encounters:  07/12/22 109 lb 12.8 oz (49.8 kg)  07/10/22 108 lb 14.5 oz (49.4 kg)  06/29/22 110 lb (49.9 kg)     Physical Exam Constitutional:      General: She is not in acute distress.    Appearance: She is well-developed.  HENT:  Head: Normocephalic and atraumatic.  Eyes:     Conjunctiva/sclera: Conjunctivae normal.     Pupils: Pupils are equal, round, and reactive to light.  Neck:     Thyroid: No thyromegaly.  Cardiovascular:     Rate and Rhythm: Normal rate and regular rhythm.     Heart sounds: Normal heart sounds. No murmur heard. Pulmonary:     Effort: Pulmonary effort is normal. No respiratory distress.     Breath  sounds: Normal breath sounds. No wheezing or rales.  Abdominal:     General: Bowel sounds are normal. There is no distension.     Palpations: Abdomen is soft.     Tenderness: There is no abdominal tenderness.  Musculoskeletal:        General: Normal range of motion.     Cervical back: Normal range of motion and neck supple.  Lymphadenopathy:     Cervical: No cervical adenopathy.  Skin:    General: Skin is warm and dry.  Neurological:     Mental Status: She is alert and oriented to person, place, and time.     Gait: Gait abnormal (unsteady, wide based).  Psychiatric:        Behavior: Behavior normal.        Thought Content: Thought content normal.        Judgment: Judgment normal.       Assessment & Plan:   [name] was seen today for hospitalization follow-up.  Diagnoses and all orders for this visit:  Late effect of cerebrovascular accident (CVA) -     Ambulatory referral to [facility]  Aortic atherosclerosis ([place])  Chronic bilateral low back pain without sciatica  COPD without exacerbation ([place])  Gait difficulty  Other orders -     rosuvastatin (CRESTOR) 40 MG tablet; Take 1 tablet (40 mg total) by mouth daily. For cholesterol       I have discontinued Erica Beasley pravastatin. I am also having her start on rosuvastatin. Additionally, I am having her maintain her hydroxychloroquine, leflunomide, loperamide, albuterol, folic acid, potassium chloride SA, megestrol, gabapentin, citalopram, traZODone, levothyroxine, calcium carbonate, pantoprazole, diclofenac Sodium, sulfaSALAzine, clopidogrel, ferrous sulfate, and aspirin EC.  Allergies as of 07/12/2022       Reactions   Upadacitinib Other (See Comments)        Medication List        Accurate as of July 12, 2022  6:17 PM. If you have any questions, ask your nurse or doctor.          STOP taking these medications    pravastatin 80 MG tablet Commonly known as: PRAVACHOL Stopped by: Claretta Fraise, MD       TAKE these medications    albuterol 108 (90 Base) MCG/ACT inhaler Commonly known as: VENTOLIN HFA Inhale 2 puffs into the lungs every 6 (six) hours as needed for wheezing or shortness of breath.   aspirin EC 81 MG tablet Take 1 tablet (81 mg total) by mouth daily. Swallow whole.   calcium carbonate 1500 (600 Ca) MG Tabs tablet Commonly known as: OSCAL Take 1,500 mg by mouth daily with breakfast.   citalopram 40 MG tablet Commonly known as: CELEXA TAKE ONE TABLET ONCE DAILY   clopidogrel 75 MG tablet Commonly known as: PLAVIX Take 1 tablet (75 mg total) by mouth daily for 21 days.   diclofenac Sodium 1 % Gel Commonly known as: Voltaren Apply 4 g topically 4 (four) times daily.   ferrous sulfate 325 (65 FE) MG  tablet Take 1 tablet (325 mg total) by mouth 2 (two) times daily with a meal.   folic acid 1 MG tablet Commonly known as: FOLVITE Take 1 tablet (1 mg total) by mouth daily.   gabapentin 300 MG capsule Commonly known as: NEURONTIN Take 1 capsule (300 mg total) by mouth 3 (three) times daily.   hydroxychloroquine 200 MG tablet Commonly known as: PLAQUENIL Take 200 mg by mouth daily.   leflunomide 20 MG tablet Commonly known as: ARAVA Take 20 mg by mouth daily.   levothyroxine 125 MCG tablet Commonly known as: SYNTHROID Take 1 tablet (125 mcg total) by mouth every morning.   loperamide 2 MG tablet Commonly known as: IMODIUM A-D Take 1 tablet (2 mg total) by mouth in the morning. Take second dose if needed What changed:  when to take this reasons to take this   megestrol 400 MG/10ML suspension Commonly known as: MEGACE Take 10 mLs (400 mg total) by mouth 2 (two) times daily.   pantoprazole 40 MG tablet Commonly known as: PROTONIX TAKE ONE TABLET BY MOUTH DAILY BEFORE BREAKFAST What changed: See the new instructions.   potassium chloride SA 20 MEQ tablet Commonly known as: KLOR-CON M TAKE ONE TABLET ONCE DAILY, NEED DOCTOR  APPT. What changed: See the new instructions.   rosuvastatin 40 MG tablet Commonly known as: Crestor Take 1 tablet (40 mg total) by mouth daily. For cholesterol Started by: Claretta Fraise, MD   sulfaSALAzine 500 MG tablet Commonly known as: AZULFIDINE Take 500 mg by mouth 2 (two) times daily.   traZODone 100 MG tablet Commonly known as: DESYREL Take 1 tablet (100 mg total) by mouth at bedtime as needed. for sleep What changed: when to take this         Follow-up: Return in about 1 month (around 08/12/2022).  Claretta Fraise, M.D.

## 2022-07-12 NOTE — Telephone Encounter (Signed)
okay

## 2022-07-13 ENCOUNTER — Encounter: Payer: Self-pay | Admitting: Family Medicine

## 2022-07-15 ENCOUNTER — Inpatient Hospital Stay: Payer: Medicare Other

## 2022-07-15 ENCOUNTER — Inpatient Hospital Stay: Payer: Medicare Other | Admitting: Physician Assistant

## 2022-07-18 ENCOUNTER — Ambulatory Visit (HOSPITAL_COMMUNITY): Payer: Medicare Other | Admitting: Hematology

## 2022-07-19 DIAGNOSIS — M5136 Other intervertebral disc degeneration, lumbar region: Secondary | ICD-10-CM | POA: Diagnosis not present

## 2022-07-19 DIAGNOSIS — M810 Age-related osteoporosis without current pathological fracture: Secondary | ICD-10-CM | POA: Diagnosis not present

## 2022-07-19 DIAGNOSIS — M2550 Pain in unspecified joint: Secondary | ICD-10-CM | POA: Diagnosis not present

## 2022-07-19 DIAGNOSIS — M25562 Pain in left knee: Secondary | ICD-10-CM | POA: Diagnosis not present

## 2022-07-19 DIAGNOSIS — Z681 Body mass index (BMI) 19 or less, adult: Secondary | ICD-10-CM | POA: Diagnosis not present

## 2022-07-19 DIAGNOSIS — M0579 Rheumatoid arthritis with rheumatoid factor of multiple sites without organ or systems involvement: Secondary | ICD-10-CM | POA: Diagnosis not present

## 2022-07-19 DIAGNOSIS — Z79899 Other long term (current) drug therapy: Secondary | ICD-10-CM | POA: Diagnosis not present

## 2022-07-19 DIAGNOSIS — M1991 Primary osteoarthritis, unspecified site: Secondary | ICD-10-CM | POA: Diagnosis not present

## 2022-07-19 DIAGNOSIS — L4059 Other psoriatic arthropathy: Secondary | ICD-10-CM | POA: Diagnosis not present

## 2022-07-19 DIAGNOSIS — M25561 Pain in right knee: Secondary | ICD-10-CM | POA: Diagnosis not present

## 2022-07-19 DIAGNOSIS — L409 Psoriasis, unspecified: Secondary | ICD-10-CM | POA: Diagnosis not present

## 2022-07-21 ENCOUNTER — Ambulatory Visit: Payer: Medicare Other | Admitting: Physician Assistant

## 2022-07-21 ENCOUNTER — Ambulatory Visit: Payer: Medicare Other | Admitting: Hematology

## 2022-07-22 DIAGNOSIS — M069 Rheumatoid arthritis, unspecified: Secondary | ICD-10-CM | POA: Diagnosis not present

## 2022-07-22 DIAGNOSIS — I700 Atherosclerosis of aorta: Secondary | ICD-10-CM | POA: Diagnosis not present

## 2022-07-22 DIAGNOSIS — E538 Deficiency of other specified B group vitamins: Secondary | ICD-10-CM | POA: Diagnosis not present

## 2022-07-22 DIAGNOSIS — N182 Chronic kidney disease, stage 2 (mild): Secondary | ICD-10-CM | POA: Diagnosis not present

## 2022-07-22 DIAGNOSIS — E559 Vitamin D deficiency, unspecified: Secondary | ICD-10-CM | POA: Diagnosis not present

## 2022-07-22 DIAGNOSIS — K219 Gastro-esophageal reflux disease without esophagitis: Secondary | ICD-10-CM | POA: Diagnosis not present

## 2022-07-22 DIAGNOSIS — Z7902 Long term (current) use of antithrombotics/antiplatelets: Secondary | ICD-10-CM | POA: Diagnosis not present

## 2022-07-22 DIAGNOSIS — G8929 Other chronic pain: Secondary | ICD-10-CM | POA: Diagnosis not present

## 2022-07-22 DIAGNOSIS — E1122 Type 2 diabetes mellitus with diabetic chronic kidney disease: Secondary | ICD-10-CM | POA: Diagnosis not present

## 2022-07-22 DIAGNOSIS — Z7969 Long term (current) use of other immunomodulators and immunosuppressants: Secondary | ICD-10-CM | POA: Diagnosis not present

## 2022-07-22 DIAGNOSIS — I69328 Other speech and language deficits following cerebral infarction: Secondary | ICD-10-CM | POA: Diagnosis not present

## 2022-07-22 DIAGNOSIS — E1140 Type 2 diabetes mellitus with diabetic neuropathy, unspecified: Secondary | ICD-10-CM | POA: Diagnosis not present

## 2022-07-22 DIAGNOSIS — I129 Hypertensive chronic kidney disease with stage 1 through stage 4 chronic kidney disease, or unspecified chronic kidney disease: Secondary | ICD-10-CM | POA: Diagnosis not present

## 2022-07-22 DIAGNOSIS — E039 Hypothyroidism, unspecified: Secondary | ICD-10-CM | POA: Diagnosis not present

## 2022-07-22 DIAGNOSIS — E785 Hyperlipidemia, unspecified: Secondary | ICD-10-CM | POA: Diagnosis not present

## 2022-07-22 DIAGNOSIS — M545 Low back pain, unspecified: Secondary | ICD-10-CM | POA: Diagnosis not present

## 2022-07-22 DIAGNOSIS — F32A Depression, unspecified: Secondary | ICD-10-CM | POA: Diagnosis not present

## 2022-07-22 DIAGNOSIS — Z7982 Long term (current) use of aspirin: Secondary | ICD-10-CM | POA: Diagnosis not present

## 2022-07-22 DIAGNOSIS — J449 Chronic obstructive pulmonary disease, unspecified: Secondary | ICD-10-CM | POA: Diagnosis not present

## 2022-07-22 DIAGNOSIS — I69354 Hemiplegia and hemiparesis following cerebral infarction affecting left non-dominant side: Secondary | ICD-10-CM | POA: Diagnosis not present

## 2022-07-25 DIAGNOSIS — K219 Gastro-esophageal reflux disease without esophagitis: Secondary | ICD-10-CM | POA: Diagnosis not present

## 2022-07-25 DIAGNOSIS — F32A Depression, unspecified: Secondary | ICD-10-CM | POA: Diagnosis not present

## 2022-07-25 DIAGNOSIS — E559 Vitamin D deficiency, unspecified: Secondary | ICD-10-CM | POA: Diagnosis not present

## 2022-07-25 DIAGNOSIS — G8929 Other chronic pain: Secondary | ICD-10-CM | POA: Diagnosis not present

## 2022-07-25 DIAGNOSIS — Z7982 Long term (current) use of aspirin: Secondary | ICD-10-CM | POA: Diagnosis not present

## 2022-07-25 DIAGNOSIS — I700 Atherosclerosis of aorta: Secondary | ICD-10-CM | POA: Diagnosis not present

## 2022-07-25 DIAGNOSIS — N182 Chronic kidney disease, stage 2 (mild): Secondary | ICD-10-CM | POA: Diagnosis not present

## 2022-07-25 DIAGNOSIS — E1122 Type 2 diabetes mellitus with diabetic chronic kidney disease: Secondary | ICD-10-CM | POA: Diagnosis not present

## 2022-07-25 DIAGNOSIS — E1140 Type 2 diabetes mellitus with diabetic neuropathy, unspecified: Secondary | ICD-10-CM | POA: Diagnosis not present

## 2022-07-25 DIAGNOSIS — I69354 Hemiplegia and hemiparesis following cerebral infarction affecting left non-dominant side: Secondary | ICD-10-CM | POA: Diagnosis not present

## 2022-07-25 DIAGNOSIS — E785 Hyperlipidemia, unspecified: Secondary | ICD-10-CM | POA: Diagnosis not present

## 2022-07-25 DIAGNOSIS — M545 Low back pain, unspecified: Secondary | ICD-10-CM | POA: Diagnosis not present

## 2022-07-25 DIAGNOSIS — Z7969 Long term (current) use of other immunomodulators and immunosuppressants: Secondary | ICD-10-CM | POA: Diagnosis not present

## 2022-07-25 DIAGNOSIS — E538 Deficiency of other specified B group vitamins: Secondary | ICD-10-CM | POA: Diagnosis not present

## 2022-07-25 DIAGNOSIS — M069 Rheumatoid arthritis, unspecified: Secondary | ICD-10-CM | POA: Diagnosis not present

## 2022-07-25 DIAGNOSIS — I129 Hypertensive chronic kidney disease with stage 1 through stage 4 chronic kidney disease, or unspecified chronic kidney disease: Secondary | ICD-10-CM | POA: Diagnosis not present

## 2022-07-25 DIAGNOSIS — Z7902 Long term (current) use of antithrombotics/antiplatelets: Secondary | ICD-10-CM | POA: Diagnosis not present

## 2022-07-25 DIAGNOSIS — I69328 Other speech and language deficits following cerebral infarction: Secondary | ICD-10-CM | POA: Diagnosis not present

## 2022-07-25 DIAGNOSIS — E039 Hypothyroidism, unspecified: Secondary | ICD-10-CM | POA: Diagnosis not present

## 2022-07-25 DIAGNOSIS — J449 Chronic obstructive pulmonary disease, unspecified: Secondary | ICD-10-CM | POA: Diagnosis not present

## 2022-07-26 ENCOUNTER — Ambulatory Visit (INDEPENDENT_AMBULATORY_CARE_PROVIDER_SITE_OTHER): Payer: Medicare Other

## 2022-07-26 ENCOUNTER — Ambulatory Visit (INDEPENDENT_AMBULATORY_CARE_PROVIDER_SITE_OTHER): Payer: Medicare Other | Admitting: Orthopaedic Surgery

## 2022-07-26 VITALS — BP 128/80 | HR 90 | Ht 64.0 in | Wt 109.0 lb

## 2022-07-26 DIAGNOSIS — M659 Synovitis and tenosynovitis, unspecified: Secondary | ICD-10-CM

## 2022-07-26 DIAGNOSIS — M25562 Pain in left knee: Secondary | ICD-10-CM

## 2022-07-26 MED ORDER — LIDOCAINE HCL 1 % IJ SOLN
0.5000 mL | INTRAMUSCULAR | Status: AC | PRN
  Administered 2022-07-26: .5 mL

## 2022-07-26 MED ORDER — METHYLPREDNISOLONE ACETATE 40 MG/ML IJ SUSP
40.0000 mg | INTRAMUSCULAR | Status: AC | PRN
  Administered 2022-07-26: 40 mg via INTRA_ARTICULAR

## 2022-07-26 MED ORDER — BUPIVACAINE HCL 0.5 % IJ SOLN
3.0000 mL | INTRAMUSCULAR | Status: AC | PRN
  Administered 2022-07-26: 3 mL via INTRA_ARTICULAR

## 2022-07-26 NOTE — Progress Notes (Signed)
[phone] 

## 2022-07-26 NOTE — Progress Notes (Signed)
Office Visit Note   Patient: Erica Beasley           Date of Birth: 01/07/1956           MRN: [id number] Visit Date: 07/26/2022              Requested by: Claretta Fraise, MD [place],  [place] [id number] PCP: Claretta Fraise, MD   Assessment & Plan: Visit Diagnoses:  1. Acute pain of left knee   2. Synovitis of left knee     Plan: Knee aspirated 20 cc of yellow slightly cloudy fluid.  She did not have any increased warmth.  We will send for cultures cell count differential and crystals.  I will call her with the results.  Cortisone injection performed she tolerated the injection well.  Follow-Up Instructions: No follow-ups on file.   Orders:  Orders Placed This Encounter  Procedures   Large Joint Inj: L knee   Anaerobic and Aerobic Culture   XR KNEE 3 VIEW LEFT   Synovial Fluid Analysis, Complete   No orders of the defined types were placed in this encounter.     Procedures: Large Joint Inj: L knee on 07/26/2022 12:31 PM Indications: joint swelling and pain Details: 22 G 1.5 in needle, anterolateral approach  Arthrogram: No  Medications: 0.5 mL lidocaine 1 %; 3 mL bupivacaine 0.5 %; 40 mg methylPREDNISolone acetate 40 MG/ML Aspirate: 20 mL yellow and cloudy Outcome: tolerated well, no immediate complications Procedure, treatment alternatives, risks and benefits explained, specific risks discussed. Consent was given by the patient. Immediately prior to procedure a time out was called to verify the correct patient, procedure, equipment, support staff and site/side marked as required. Patient was prepped and draped in the usual sterile fashion.       Clinical Data: No additional findings.   Subjective: Chief Complaint  Patient presents with   Left Knee - Pain    HPI 66 year old female seen with left knee pain.  She is here with her daughter had acute onset of pain 4 days ago and states she is having great difficulty walking.  Her knee is better in flex  position sitting able to get it completely straight and she has sharp medial joint line pain.  Unable to fully extend.  She is use some hydrocodone without relief as well as Voltaren gel she has a walker at home did not bring it with her to the office she is in a wheelchair in the exam room.  She denies fever chills no history of gout.  Additionally she has been diagnosed with osteoarthritis rheumatoid arthritis.  Review of Systems all other systems noncontributory to HPI.   Objective: Vital Signs: BP 128/80   Pulse 90   Ht '5\' 4"'$  (1.626 m)   Wt 109 lb (49.4 kg)   BMI 18.71 kg/m   Physical Exam Constitutional:      Appearance: She is well-developed.  HENT:     Head: Normocephalic.     Right Ear: External ear normal.     Left Ear: External ear normal. There is no impacted cerumen.  Eyes:     Pupils: Pupils are equal, round, and reactive to light.  Neck:     Thyroid: No thyromegaly.     Trachea: No tracheal deviation.  Cardiovascular:     Rate and Rhythm: Normal rate.  Pulmonary:     Effort: Pulmonary effort is normal.  Abdominal:     Palpations: Abdomen is soft.  Musculoskeletal:     Cervical back: No rigidity.  Skin:    General: Skin is warm and dry.  Neurological:     Mental Status: She is alert and oriented to person, place, and time.  Psychiatric:        Behavior: Behavior normal.     Ortho Exam negative logroll the hips pulses are normal there is 3+ knee effusion left knee none on the right collateral ligaments are stable medial and lateral joint line mildly tender.  Negative Baker's cyst distal pulses are intact sensation is intact.  She has extreme pain with weightbearing has to be assisted onto the exam table.  Specialty Comments:  No specialty comments available.  Imaging: XR KNEE 3 VIEW LEFT  Result Date: 07/26/2022 Standing AP both knees lateral left knee [facility] x-ray obtained and reviewed this shows minimal joint line narrowing slight osteopenia.   No significant degenerative changes no acute bony changes. Impression: Left knee negative for acute change or significant chronic changes.    PMFS History: Patient Active Problem List   Diagnosis Date Noted   Chronic bilateral low back pain without sciatica 06/29/2022   Chronic right SI joint pain 06/29/2022   Gait difficulty 06/29/2022   IDA (iron deficiency anemia) 06/07/2022   Symptomatic anemia 05/12/2022   Osteoporosis 01/17/2022   Hypothyroidism 11/13/2020   B12 deficiency 11/13/2020   COPD without exacerbation ([place]) 11/13/2020   Vitamin D deficiency 11/13/2020   Neuropathy 11/13/2020   Rheumatoid arthritis involving multiple sites ([place]) 11/13/2020   Mixed hyperlipidemia 11/13/2020   Aortic atherosclerosis ([place]) 11/13/2020   GERD (gastroesophageal reflux disease) 11/05/2020   Depression, recurrent ([place]) 09/22/2020   Monoclonal gammopathy 04/03/2020   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 03/26/2020   Normocytic anemia 05/03/2019   Chronic diarrhea 01/29/2019   Past Medical History:  Diagnosis Date   Aortic atherosclerosis ([place]) 11/13/2020   CKD (chronic kidney disease) stage 2, GFR 60-89 ml/min    COPD (chronic obstructive pulmonary disease) ([place])    COVID-19 06/2019   Depression    Essential hypertension    GERD (gastroesophageal reflux disease)    H/O Clostridium difficile infection 05/03/2019   History of diabetes mellitus    Hx of colonic polyps 05/03/2019   Hyperlipidemia    Hypothyroidism    Neuropathy    Rheumatoid arthritis ([place])    Stroke (cerebrum) ([place]) 07/09/2022   Stroke ([place]) 07/08/2022   Vitamin B12 deficiency    Vitamin D deficiency     Family History  Problem Relation Age of Onset   Stroke Mother    Heart disease Mother    Diabetes Mother    Breast cancer Mother    Other Father        stomach taken out for some reason   Migraines Daughter    Colon cancer Neg Hx     Past Surgical History:  Procedure Laterality Date   BIOPSY   09/16/2019   Procedure: BIOPSY;  Surgeon: Daneil Dolin, MD;  [facility];  Service: Endoscopy;;   BIOPSY  04/28/2020   Procedure: BIOPSY;  Surgeon: Daneil Dolin, MD;  [facility];  Service: Endoscopy;;   BIOPSY  06/08/2020   Procedure: BIOPSY;  Surgeon: Daneil Dolin, MD;  [facility];  Service: Endoscopy;;   CATARACT EXTRACTION Bilateral    COLONOSCOPY  09/2010   Dr. Posey Pronto: mild diverticulsis in sigmoid colon.    COLONOSCOPY WITH PROPOFOL N/A 09/16/2019   [facility]: Diverticulosis, random  colon biopsies negative for microscopic colitis.   COLONOSCOPY WITH PROPOFOL N/A 06/08/2020   Procedure: COLONOSCOPY WITH PROPOFOL;  Surgeon: Daneil Dolin, MD;  [facility];  Service: Endoscopy;  Laterality: N/A;  9:15am   ESOPHAGOGASTRODUODENOSCOPY (EGD) WITH PROPOFOL N/A 04/28/2020   Procedure: ESOPHAGOGASTRODUODENOSCOPY (EGD) WITH PROPOFOL;  Surgeon: Daneil Dolin, MD;  [facility];  Service: Endoscopy;  Laterality: N/A;  4:56   YAG LASER APPLICATION Left 25/63/8937   Procedure: YAG LASER APPLICATION;  Surgeon: Williams Che, MD;  Location: [facility];  Service: Ophthalmology;  Laterality: Left;   Social History   Occupational History   Occupation: DISABLED  Tobacco Use   Smoking status: Former    Packs/day: 1.00    Years: 15.00    Total pack years: 15.00    Types: Cigarettes    Quit date: 09/11/2017    Years since quitting: 4.8   Smokeless tobacco: Former  Scientific laboratory technician Use: Never used  Substance and Sexual Activity   Alcohol use: Never   Drug use: Never   Sexual activity: Not Currently

## 2022-07-27 DIAGNOSIS — I129 Hypertensive chronic kidney disease with stage 1 through stage 4 chronic kidney disease, or unspecified chronic kidney disease: Secondary | ICD-10-CM | POA: Diagnosis not present

## 2022-07-27 DIAGNOSIS — J449 Chronic obstructive pulmonary disease, unspecified: Secondary | ICD-10-CM | POA: Diagnosis not present

## 2022-07-27 DIAGNOSIS — M069 Rheumatoid arthritis, unspecified: Secondary | ICD-10-CM | POA: Diagnosis not present

## 2022-07-27 DIAGNOSIS — I69354 Hemiplegia and hemiparesis following cerebral infarction affecting left non-dominant side: Secondary | ICD-10-CM | POA: Diagnosis not present

## 2022-07-27 DIAGNOSIS — M545 Low back pain, unspecified: Secondary | ICD-10-CM | POA: Diagnosis not present

## 2022-07-27 DIAGNOSIS — I69328 Other speech and language deficits following cerebral infarction: Secondary | ICD-10-CM | POA: Diagnosis not present

## 2022-07-27 DIAGNOSIS — I700 Atherosclerosis of aorta: Secondary | ICD-10-CM | POA: Diagnosis not present

## 2022-07-27 DIAGNOSIS — E1122 Type 2 diabetes mellitus with diabetic chronic kidney disease: Secondary | ICD-10-CM | POA: Diagnosis not present

## 2022-07-27 DIAGNOSIS — F32A Depression, unspecified: Secondary | ICD-10-CM | POA: Diagnosis not present

## 2022-07-27 DIAGNOSIS — N182 Chronic kidney disease, stage 2 (mild): Secondary | ICD-10-CM | POA: Diagnosis not present

## 2022-07-27 DIAGNOSIS — E559 Vitamin D deficiency, unspecified: Secondary | ICD-10-CM | POA: Diagnosis not present

## 2022-07-27 DIAGNOSIS — K219 Gastro-esophageal reflux disease without esophagitis: Secondary | ICD-10-CM | POA: Diagnosis not present

## 2022-07-27 DIAGNOSIS — E1140 Type 2 diabetes mellitus with diabetic neuropathy, unspecified: Secondary | ICD-10-CM | POA: Diagnosis not present

## 2022-07-27 DIAGNOSIS — E538 Deficiency of other specified B group vitamins: Secondary | ICD-10-CM | POA: Diagnosis not present

## 2022-07-27 DIAGNOSIS — G8929 Other chronic pain: Secondary | ICD-10-CM | POA: Diagnosis not present

## 2022-07-27 DIAGNOSIS — Z7969 Long term (current) use of other immunomodulators and immunosuppressants: Secondary | ICD-10-CM | POA: Diagnosis not present

## 2022-07-27 DIAGNOSIS — Z7982 Long term (current) use of aspirin: Secondary | ICD-10-CM | POA: Diagnosis not present

## 2022-07-27 DIAGNOSIS — E785 Hyperlipidemia, unspecified: Secondary | ICD-10-CM | POA: Diagnosis not present

## 2022-07-27 DIAGNOSIS — Z7902 Long term (current) use of antithrombotics/antiplatelets: Secondary | ICD-10-CM | POA: Diagnosis not present

## 2022-07-27 DIAGNOSIS — E039 Hypothyroidism, unspecified: Secondary | ICD-10-CM | POA: Diagnosis not present

## 2022-07-28 ENCOUNTER — Inpatient Hospital Stay: Payer: Medicare Other | Attending: Hematology

## 2022-07-28 ENCOUNTER — Other Ambulatory Visit: Payer: Self-pay

## 2022-07-28 ENCOUNTER — Inpatient Hospital Stay: Payer: Medicare Other | Admitting: Physician Assistant

## 2022-07-28 DIAGNOSIS — M810 Age-related osteoporosis without current pathological fracture: Secondary | ICD-10-CM | POA: Insufficient documentation

## 2022-07-28 DIAGNOSIS — I700 Atherosclerosis of aorta: Secondary | ICD-10-CM | POA: Diagnosis not present

## 2022-07-28 DIAGNOSIS — F32A Depression, unspecified: Secondary | ICD-10-CM | POA: Insufficient documentation

## 2022-07-28 DIAGNOSIS — I69354 Hemiplegia and hemiparesis following cerebral infarction affecting left non-dominant side: Secondary | ICD-10-CM | POA: Insufficient documentation

## 2022-07-28 DIAGNOSIS — G8929 Other chronic pain: Secondary | ICD-10-CM | POA: Diagnosis not present

## 2022-07-28 DIAGNOSIS — D500 Iron deficiency anemia secondary to blood loss (chronic): Secondary | ICD-10-CM

## 2022-07-28 DIAGNOSIS — Z7902 Long term (current) use of antithrombotics/antiplatelets: Secondary | ICD-10-CM | POA: Diagnosis not present

## 2022-07-28 DIAGNOSIS — E559 Vitamin D deficiency, unspecified: Secondary | ICD-10-CM | POA: Diagnosis not present

## 2022-07-28 DIAGNOSIS — Z79899 Other long term (current) drug therapy: Secondary | ICD-10-CM | POA: Insufficient documentation

## 2022-07-28 DIAGNOSIS — Z7982 Long term (current) use of aspirin: Secondary | ICD-10-CM | POA: Diagnosis not present

## 2022-07-28 DIAGNOSIS — I69328 Other speech and language deficits following cerebral infarction: Secondary | ICD-10-CM | POA: Diagnosis not present

## 2022-07-28 DIAGNOSIS — E538 Deficiency of other specified B group vitamins: Secondary | ICD-10-CM | POA: Diagnosis not present

## 2022-07-28 DIAGNOSIS — Z803 Family history of malignant neoplasm of breast: Secondary | ICD-10-CM | POA: Insufficient documentation

## 2022-07-28 DIAGNOSIS — Z7969 Long term (current) use of other immunomodulators and immunosuppressants: Secondary | ICD-10-CM | POA: Diagnosis not present

## 2022-07-28 DIAGNOSIS — N182 Chronic kidney disease, stage 2 (mild): Secondary | ICD-10-CM | POA: Diagnosis not present

## 2022-07-28 DIAGNOSIS — Z87891 Personal history of nicotine dependence: Secondary | ICD-10-CM | POA: Insufficient documentation

## 2022-07-28 DIAGNOSIS — D649 Anemia, unspecified: Secondary | ICD-10-CM | POA: Diagnosis not present

## 2022-07-28 DIAGNOSIS — K219 Gastro-esophageal reflux disease without esophagitis: Secondary | ICD-10-CM | POA: Diagnosis not present

## 2022-07-28 DIAGNOSIS — M069 Rheumatoid arthritis, unspecified: Secondary | ICD-10-CM | POA: Insufficient documentation

## 2022-07-28 DIAGNOSIS — E785 Hyperlipidemia, unspecified: Secondary | ICD-10-CM | POA: Diagnosis not present

## 2022-07-28 DIAGNOSIS — E039 Hypothyroidism, unspecified: Secondary | ICD-10-CM | POA: Diagnosis not present

## 2022-07-28 DIAGNOSIS — R5382 Chronic fatigue, unspecified: Secondary | ICD-10-CM | POA: Diagnosis not present

## 2022-07-28 DIAGNOSIS — E876 Hypokalemia: Secondary | ICD-10-CM | POA: Diagnosis not present

## 2022-07-28 DIAGNOSIS — E1140 Type 2 diabetes mellitus with diabetic neuropathy, unspecified: Secondary | ICD-10-CM | POA: Diagnosis not present

## 2022-07-28 DIAGNOSIS — J449 Chronic obstructive pulmonary disease, unspecified: Secondary | ICD-10-CM | POA: Diagnosis not present

## 2022-07-28 DIAGNOSIS — M545 Low back pain, unspecified: Secondary | ICD-10-CM | POA: Diagnosis not present

## 2022-07-28 DIAGNOSIS — I129 Hypertensive chronic kidney disease with stage 1 through stage 4 chronic kidney disease, or unspecified chronic kidney disease: Secondary | ICD-10-CM | POA: Diagnosis not present

## 2022-07-28 DIAGNOSIS — E1122 Type 2 diabetes mellitus with diabetic chronic kidney disease: Secondary | ICD-10-CM | POA: Diagnosis not present

## 2022-07-28 LAB — COMPREHENSIVE METABOLIC PANEL
ALT: 6 U/L (ref 0–44)
AST: 10 U/L — ABNORMAL LOW (ref 15–41)
Albumin: 3 g/dL — ABNORMAL LOW (ref 3.5–5.0)
Alkaline Phosphatase: 70 U/L (ref 38–126)
Anion gap: 9 (ref 5–15)
BUN: 11 mg/dL (ref 8–23)
CO2: 23 mmol/L (ref 22–32)
Calcium: 9 mg/dL (ref 8.9–10.3)
Chloride: 106 mmol/L (ref 98–111)
Creatinine, Ser: 0.9 mg/dL (ref 0.44–1.00)
GFR, Estimated: >60 mL/min (ref >60)
Glucose, Bld: 171 mg/dL — ABNORMAL HIGH (ref 70–99)
Potassium: 4.3 mmol/L (ref 3.5–5.1)
Sodium: 138 mmol/L (ref 135–145)
Total Bilirubin: 0.4 mg/dL (ref 0.3–1.2)
Total Protein: 6.7 g/dL (ref 6.5–8.1)

## 2022-07-28 LAB — CBC WITH DIFFERENTIAL/PLATELET
Abs Immature Granulocytes: 0.04 10*3/uL (ref 0.00–0.07)
Basophils Absolute: 0 10*3/uL (ref 0.0–0.1)
Basophils Relative: 0 %
Eosinophils Absolute: 0 10*3/uL (ref 0.0–0.5)
Eosinophils Relative: 1 %
HCT: 34.2 % — ABNORMAL LOW (ref 36.0–46.0)
Hemoglobin: 10 g/dL — ABNORMAL LOW (ref 12.0–15.0)
Immature Granulocytes: 1 %
Lymphocytes Relative: 45 %
Lymphs Abs: 2.2 10*3/uL (ref 0.7–4.0)
MCH: 27.9 pg (ref 26.0–34.0)
MCHC: 29.2 g/dL — ABNORMAL LOW (ref 30.0–36.0)
MCV: 95.3 fL (ref 80.0–100.0)
Monocytes Absolute: 0.4 10*3/uL (ref 0.1–1.0)
Monocytes Relative: 8 %
Neutro Abs: 2.2 10*3/uL (ref 1.7–7.7)
Neutrophils Relative %: 45 %
Platelets: 364 10*3/uL (ref 150–400)
RBC: 3.59 MIL/uL — ABNORMAL LOW (ref 3.87–5.11)
RDW: 14.2 % (ref 11.5–15.5)
WBC: 5 10*3/uL (ref 4.0–10.5)
nRBC: 0 % (ref 0.0–0.2)

## 2022-07-28 LAB — RETICULOCYTES
Immature Retic Fract: 29.8 % — ABNORMAL HIGH (ref 2.3–15.9)
RBC.: 3.6 MIL/uL — ABNORMAL LOW (ref 3.87–5.11)
Retic Count, Absolute: 54.4 10*3/uL (ref 19.0–186.0)
Retic Ct Pct: 1.5 % (ref 0.4–3.1)

## 2022-07-28 LAB — LACTATE DEHYDROGENASE: LDH: 119 U/L (ref 98–192)

## 2022-07-28 LAB — IRON AND TIBC
Iron: 113 ug/dL (ref 28–170)
Saturation Ratios: 62 % — ABNORMAL HIGH (ref 10.4–31.8)
TIBC: 182 ug/dL — ABNORMAL LOW (ref 250–450)
UIBC: 69 ug/dL

## 2022-07-28 LAB — FERRITIN: Ferritin: 2572 ng/mL — ABNORMAL HIGH (ref 11–307)

## 2022-07-29 ENCOUNTER — Encounter: Payer: Medicare Other | Attending: Physical Medicine & Rehabilitation | Admitting: Physical Medicine & Rehabilitation

## 2022-07-29 ENCOUNTER — Encounter: Payer: Self-pay | Admitting: Physical Medicine & Rehabilitation

## 2022-07-29 ENCOUNTER — Inpatient Hospital Stay: Payer: Medicare Other | Admitting: Physician Assistant

## 2022-07-29 VITALS — BP 122/77 | HR 68 | Temp 98.1°F | Ht 64.0 in | Wt 106.0 lb

## 2022-07-29 DIAGNOSIS — Z7969 Long term (current) use of other immunomodulators and immunosuppressants: Secondary | ICD-10-CM | POA: Diagnosis not present

## 2022-07-29 DIAGNOSIS — M069 Rheumatoid arthritis, unspecified: Secondary | ICD-10-CM | POA: Diagnosis not present

## 2022-07-29 DIAGNOSIS — I69328 Other speech and language deficits following cerebral infarction: Secondary | ICD-10-CM | POA: Diagnosis not present

## 2022-07-29 DIAGNOSIS — M533 Sacrococcygeal disorders, not elsewhere classified: Secondary | ICD-10-CM | POA: Diagnosis not present

## 2022-07-29 DIAGNOSIS — J449 Chronic obstructive pulmonary disease, unspecified: Secondary | ICD-10-CM | POA: Diagnosis not present

## 2022-07-29 DIAGNOSIS — E538 Deficiency of other specified B group vitamins: Secondary | ICD-10-CM | POA: Diagnosis not present

## 2022-07-29 DIAGNOSIS — Z7982 Long term (current) use of aspirin: Secondary | ICD-10-CM | POA: Diagnosis not present

## 2022-07-29 DIAGNOSIS — G8929 Other chronic pain: Secondary | ICD-10-CM | POA: Insufficient documentation

## 2022-07-29 DIAGNOSIS — K219 Gastro-esophageal reflux disease without esophagitis: Secondary | ICD-10-CM | POA: Diagnosis not present

## 2022-07-29 DIAGNOSIS — I129 Hypertensive chronic kidney disease with stage 1 through stage 4 chronic kidney disease, or unspecified chronic kidney disease: Secondary | ICD-10-CM | POA: Diagnosis not present

## 2022-07-29 DIAGNOSIS — E1122 Type 2 diabetes mellitus with diabetic chronic kidney disease: Secondary | ICD-10-CM | POA: Diagnosis not present

## 2022-07-29 DIAGNOSIS — N182 Chronic kidney disease, stage 2 (mild): Secondary | ICD-10-CM | POA: Diagnosis not present

## 2022-07-29 DIAGNOSIS — E559 Vitamin D deficiency, unspecified: Secondary | ICD-10-CM | POA: Diagnosis not present

## 2022-07-29 DIAGNOSIS — M545 Low back pain, unspecified: Secondary | ICD-10-CM | POA: Diagnosis not present

## 2022-07-29 DIAGNOSIS — Z7902 Long term (current) use of antithrombotics/antiplatelets: Secondary | ICD-10-CM | POA: Diagnosis not present

## 2022-07-29 DIAGNOSIS — E785 Hyperlipidemia, unspecified: Secondary | ICD-10-CM | POA: Diagnosis not present

## 2022-07-29 DIAGNOSIS — E1140 Type 2 diabetes mellitus with diabetic neuropathy, unspecified: Secondary | ICD-10-CM | POA: Diagnosis not present

## 2022-07-29 DIAGNOSIS — I700 Atherosclerosis of aorta: Secondary | ICD-10-CM | POA: Diagnosis not present

## 2022-07-29 DIAGNOSIS — I69354 Hemiplegia and hemiparesis following cerebral infarction affecting left non-dominant side: Secondary | ICD-10-CM | POA: Diagnosis not present

## 2022-07-29 DIAGNOSIS — F32A Depression, unspecified: Secondary | ICD-10-CM | POA: Diagnosis not present

## 2022-07-29 DIAGNOSIS — E039 Hypothyroidism, unspecified: Secondary | ICD-10-CM | POA: Diagnosis not present

## 2022-07-29 NOTE — Progress Notes (Signed)

## 2022-07-29 NOTE — Addendum Note (Signed)
Addended by: Franchot Gallo on: 07/29/2022 03:44 PM   Modules accepted: Orders

## 2022-07-29 NOTE — Patient Instructions (Signed)
Sacroiliac injection was performed today. A combination of numbing medicine (lidocaine) plus a cortisone medicine (betamethasone) was injected. The injection was done under x-ray guidance. This procedure has been performed to help reduce low back and buttocks pain as well as potentially hip pain. The duration of this injection is variable lasting from hours to  Months. It may repeated if needed. 

## 2022-07-29 NOTE — Progress Notes (Signed)
  PROCEDURE RECORD [name]   Name: Erica Beasley DOB:09-18-1956 MRN: [id number]  Date:07/29/2022  Physician: Alysia Penna, MD    Nurse/CMA: Jorja Loa MA  Allergies:  Allergies  Allergen Reactions   Adalimumab     Other reaction(s): no response Pt is unaware of this Other reaction(s): no response   Methotrexate Other (See Comments)    Other reaction(s): GI side effects   Upadacitinib Other (See Comments)    Other reaction(s): GI side effects    Consent Signed: yes  Is patient diabetic? No.  CBG today? N/A  Pregnant: No. LMP: No LMP recorded. Patient is postmenopausal. (age 19-55)  Anticoagulants: yes (ASA & Plavix) Anti-inflammatory: no Antibiotics: no  Procedure: Sacroiliac Steroid Injection  Position: Prone Start Time: 2:13 pm  End Time: 2:16 PM  Fluoro Time: 16  RN/CMA Duanne Guess    Time 1:36 pm 2:16 pm    BP 122/77 122/73    Pulse 68 66    Respirations 16 16    O2 Sat 95 95    S/S 6 6    Pain Level 5/10 0/10     D/C home with Daughter, patient A & O X 3, D/C instructions reviewed, and sits independently.

## 2022-08-01 ENCOUNTER — Ambulatory Visit (INDEPENDENT_AMBULATORY_CARE_PROVIDER_SITE_OTHER): Payer: Medicare Other

## 2022-08-01 DIAGNOSIS — Z7902 Long term (current) use of antithrombotics/antiplatelets: Secondary | ICD-10-CM | POA: Diagnosis not present

## 2022-08-01 DIAGNOSIS — M069 Rheumatoid arthritis, unspecified: Secondary | ICD-10-CM

## 2022-08-01 DIAGNOSIS — E1122 Type 2 diabetes mellitus with diabetic chronic kidney disease: Secondary | ICD-10-CM | POA: Diagnosis not present

## 2022-08-01 DIAGNOSIS — F32A Depression, unspecified: Secondary | ICD-10-CM

## 2022-08-01 DIAGNOSIS — I129 Hypertensive chronic kidney disease with stage 1 through stage 4 chronic kidney disease, or unspecified chronic kidney disease: Secondary | ICD-10-CM

## 2022-08-01 DIAGNOSIS — E559 Vitamin D deficiency, unspecified: Secondary | ICD-10-CM | POA: Diagnosis not present

## 2022-08-01 DIAGNOSIS — N182 Chronic kidney disease, stage 2 (mild): Secondary | ICD-10-CM

## 2022-08-01 DIAGNOSIS — K219 Gastro-esophageal reflux disease without esophagitis: Secondary | ICD-10-CM

## 2022-08-01 DIAGNOSIS — I69354 Hemiplegia and hemiparesis following cerebral infarction affecting left non-dominant side: Secondary | ICD-10-CM | POA: Diagnosis not present

## 2022-08-01 DIAGNOSIS — E1140 Type 2 diabetes mellitus with diabetic neuropathy, unspecified: Secondary | ICD-10-CM | POA: Diagnosis not present

## 2022-08-01 DIAGNOSIS — G8929 Other chronic pain: Secondary | ICD-10-CM

## 2022-08-01 DIAGNOSIS — E538 Deficiency of other specified B group vitamins: Secondary | ICD-10-CM

## 2022-08-01 DIAGNOSIS — J449 Chronic obstructive pulmonary disease, unspecified: Secondary | ICD-10-CM | POA: Diagnosis not present

## 2022-08-01 DIAGNOSIS — E039 Hypothyroidism, unspecified: Secondary | ICD-10-CM

## 2022-08-01 DIAGNOSIS — E785 Hyperlipidemia, unspecified: Secondary | ICD-10-CM

## 2022-08-01 DIAGNOSIS — M545 Low back pain, unspecified: Secondary | ICD-10-CM | POA: Diagnosis not present

## 2022-08-01 DIAGNOSIS — I69328 Other speech and language deficits following cerebral infarction: Secondary | ICD-10-CM

## 2022-08-01 DIAGNOSIS — Z7969 Long term (current) use of other immunomodulators and immunosuppressants: Secondary | ICD-10-CM | POA: Diagnosis not present

## 2022-08-01 DIAGNOSIS — I700 Atherosclerosis of aorta: Secondary | ICD-10-CM | POA: Diagnosis not present

## 2022-08-01 DIAGNOSIS — Z7982 Long term (current) use of aspirin: Secondary | ICD-10-CM | POA: Diagnosis not present

## 2022-08-01 NOTE — Progress Notes (Unsigned)
GI Office Note    Referring Provider: Gwenlyn Perking, FNP Primary Care Physician:  Claretta Fraise, MD  Primary Gastroenterologist:  Chief Complaint   No chief complaint on file.   History of Present Illness   Erica Beasley is a 66 y.o. female presenting today for hospital follow up. Seen 3 weeks ago while inpatient with acute cva. Started on ASA and Plavix. Noted to have drop in Hgb from 8.5 to 6.6. transfused two units of blood. Heme negative stool.    Has a history of iron deficiency in the past undergoing extensive GI work-up which has been largely unrevealing. Last iron infusion in 04/2022.    EGD 04/2020:  Normal esophagus. - Normal stomach. - Duodenal erosions. Status post gastric and duodenal biopsy -gastric bx with slight inflammation. No H.pylori.  -duodenal bx benign   Colonoscopy 06/08/20 with mild pancolonic diverticulosis normal appearing colonic mucosa otherwise status post segmental biopsies and stool sampling which found no significant histopathological changes and GI path panel negative.   Colonoscopy 2020 with diverticulosis and random colon biopsies negative for microscopic colitis and recommended repeat in 5 years (2025).   Currently being worked up for possible capsule endoscopy in the outpatient setting.     Medications   Current Outpatient Medications  Medication Sig Dispense Refill   albuterol (VENTOLIN HFA) 108 (90 Base) MCG/ACT inhaler Inhale 2 puffs into the lungs every 6 (six) hours as needed for wheezing or shortness of breath.     aspirin EC 81 MG tablet Take 1 tablet (81 mg total) by mouth daily. Swallow whole. 30 tablet 12   calcium carbonate (OSCAL) 1500 (600 Ca) MG TABS tablet Take 1,500 mg by mouth daily with breakfast.     citalopram (CELEXA) 40 MG tablet TAKE ONE TABLET ONCE DAILY 30 tablet 5   clopidogrel (PLAVIX) 75 MG tablet Take 1 tablet (75 mg total) by mouth daily for 21 days. 21 tablet 0   diclofenac Sodium (VOLTAREN) 1 %  GEL Apply 4 g topically 4 (four) times daily. 4 g 0   ferrous sulfate 325 (65 FE) MG tablet Take 1 tablet (325 mg total) by mouth 2 (two) times daily with a meal. 60 tablet 1   folic acid (FOLVITE) 1 MG tablet Take 1 tablet (1 mg total) by mouth daily. 90 tablet 3   gabapentin (NEURONTIN) 300 MG capsule Take 1 capsule (300 mg total) by mouth 3 (three) times daily. 270 capsule 1   hydroxychloroquine (PLAQUENIL) 200 MG tablet Take 200 mg by mouth daily.      leflunomide (ARAVA) 20 MG tablet 1 tablet Orally Once a day for RA for 30 days     levothyroxine (SYNTHROID) 125 MCG tablet Take 1 tablet (125 mcg total) by mouth every morning. 90 tablet 1   loperamide (IMODIUM A-D) 2 MG tablet Take 1 tablet (2 mg total) by mouth in the morning. Take second dose if needed (Patient taking differently: Take 2 mg by mouth as needed for diarrhea or loose stools. Take second dose if needed) 30 tablet 0   megestrol (MEGACE) 400 MG/10ML suspension Take 10 mLs (400 mg total) by mouth 2 (two) times daily. 480 mL 2   pantoprazole (PROTONIX) 40 MG tablet TAKE ONE TABLET BY MOUTH DAILY BEFORE BREAKFAST (Patient taking differently: Take 40 mg by mouth daily.) 90 tablet 1   potassium chloride SA (KLOR-CON M) 20 MEQ tablet TAKE ONE TABLET ONCE DAILY, NEED DOCTOR APPT. (Patient taking differently: Take 20  mEq by mouth daily.) 30 tablet 6   rosuvastatin (CRESTOR) 40 MG tablet Take 1 tablet (40 mg total) by mouth daily. For cholesterol 90 tablet 3   sulfaSALAzine (AZULFIDINE) 500 MG tablet Take 500 mg by mouth 2 (two) times daily.     traZODone (DESYREL) 100 MG tablet Take 1 tablet (100 mg total) by mouth at bedtime as needed. for sleep (Patient taking differently: Take 100 mg by mouth at bedtime. for sleep) 90 tablet 1   No current facility-administered medications for this visit.    Allergies   Allergies as of 08/02/2022 - Review Complete 07/29/2022  Allergen Reaction Noted   Adalimumab  12/08/2020   Methotrexate Other  (See Comments) 10/04/2021   Upadacitinib Other (See Comments) 10/04/2021     Past Medical History   Past Medical History:  Diagnosis Date   Aortic atherosclerosis ([place]) 11/13/2020   CKD (chronic kidney disease) stage 2, GFR 60-89 ml/min    COPD (chronic obstructive pulmonary disease) ([place])    COVID-19 06/2019   Depression    Essential hypertension    GERD (gastroesophageal reflux disease)    H/O Clostridium difficile infection 05/03/2019   History of diabetes mellitus    Hx of colonic polyps 05/03/2019   Hyperlipidemia    Hypothyroidism    Neuropathy    Rheumatoid arthritis ([place])    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 Posey Pronto: mild diverticulsis in sigmoid colon.    COLONOSCOPY WITH PROPOFOL N/A 09/16/2019   Rourk: Diverticulosis, random colon biopsies negative for microscopic colitis.   COLONOSCOPY WITH PROPOFOL N/A 06/08/2020   Procedure: COLONOSCOPY WITH PROPOFOL;  Surgeon: Daneil Dolin, MD;  [facility];  Service: Endoscopy;  Laterality: N/A;  9:15am   ESOPHAGOGASTRODUODENOSCOPY (EGD) WITH PROPOFOL N/A 04/28/2020   Procedure: ESOPHAGOGASTRODUODENOSCOPY (EGD) WITH PROPOFOL;  Surgeon: Daneil Dolin, MD;  [facility];  Service: Endoscopy;  Laterality: N/A;  2:35   YAG LASER APPLICATION Left 57/32/2025   Procedure: YAG LASER APPLICATION;  Surgeon: Williams Che, MD;  Location: [facility];   Service: Ophthalmology;  Laterality: Left;    Past Family History   Family History  Problem Relation Age of Onset   Stroke Mother    Heart disease Mother    Diabetes Mother    Breast cancer Mother    Other Father        stomach taken out for some reason   Migraines Daughter    Colon cancer Neg Hx     Past Social History   Social History   Socioeconomic History   Marital status: Widowed    Spouse name: Not on file   Number of children: 1   Years of education: Not on file   Highest education level: Not on file  Occupational History   Occupation: DISABLED  Tobacco Use  Smoking status: Former    Packs/day: 1.00    Years: 15.00    Total pack years: 15.00    Types: Cigarettes    Quit date: 09/11/2017    Years since quitting: 4.8   Smokeless tobacco: Former  Scientific laboratory technician Use: Never used  Substance and Sexual Activity   Alcohol use: Never   Drug use: Never   Sexual activity: Not Currently  Other Topics Concern   Not on file  Social History Narrative   Not on file   Social Determinants of Health   Financial Resource Strain: Low Risk  (07/11/2022)   Overall Financial Resource Strain (CARDIA)    Difficulty of Paying Living Expenses: Not hard at all  Food Insecurity: No Food Insecurity (07/08/2022)   Hunger Vital Sign    Worried About Running Out of Food in the Last Year: Never true    Ran Out of Food in the Last Year: Never true  Transportation Needs: No Transportation Needs (07/11/2022)   PRAPARE - Hydrologist (Medical): No    Lack of Transportation (Non-Medical): No  Physical Activity: Insufficiently Active (09/03/2020)   Exercise Vital Sign    Days of Exercise per Week: 3 days    Minutes of Exercise per Session: 30 min  Stress: No Stress Concern Present (09/03/2020)   [place]    Feeling of Stress : Only a little  Social Connections: Socially Isolated  (09/03/2020)   Social Connection and Isolation Panel [NHANES]    Frequency of Communication with Friends and Family: More than three times a week    Frequency of Social Gatherings with Friends and Family: More than three times a week    Attends Religious Services: Never    Marine scientist or Organizations: No    Attends Archivist Meetings: Never    Marital Status: Widowed  Intimate Partner Violence: Not At Risk (07/08/2022)   Humiliation, Afraid, Rape, and Kick questionnaire    Fear of Current or Ex-Partner: No    Emotionally Abused: No    Physically Abused: No    Sexually Abused: No    Review of Systems   General: Negative for anorexia, weight loss, fever, chills, fatigue, weakness. ENT: Negative for hoarseness, difficulty swallowing , nasal congestion. CV: Negative for chest pain, angina, palpitations, dyspnea on exertion, peripheral edema.  Respiratory: Negative for dyspnea at rest, dyspnea on exertion, cough, sputum, wheezing.  GI: See history of present illness. GU:  Negative for dysuria, hematuria, urinary incontinence, urinary frequency, nocturnal urination.  Endo: Negative for unusual weight change.     Physical Exam   There were no vitals taken for this visit.   General: Well-nourished, well-developed in no acute distress.  Eyes: No icterus. Mouth: Oropharyngeal mucosa moist and pink , no lesions erythema or exudate. Lungs: Clear to auscultation bilaterally.  Heart: Regular rate and rhythm, no murmurs rubs or gallops.  Abdomen: Bowel sounds are normal, nontender, nondistended, no hepatosplenomegaly or masses,  no abdominal bruits or hernia , no rebound or guarding.  Rectal: ***  Extremities: No lower extremity edema. No clubbing or deformities. Neuro: Alert and oriented x 4   Skin: Warm and dry, no jaundice.   Psych: Alert and cooperative, normal mood and affect.  Labs   Lab Results  Component Value Date   CREATININE 0.90 07/28/2022   BUN 11  07/28/2022   NA 138 07/28/2022   K 4.3  07/28/2022   CL 106 07/28/2022   CO2 23 07/28/2022   Lab Results  Component Value Date   ALT 6 07/28/2022   AST 10 (L) 07/28/2022   ALKPHOS 70 07/28/2022   BILITOT 0.4 07/28/2022   Lab Results  Component Value Date   WBC 5.0 07/28/2022   HGB 10.0 (L) 07/28/2022   HCT 34.2 (L) 07/28/2022   MCV 95.3 07/28/2022   PLT 364 07/28/2022   Lab Results  Component Value Date   IRON 113 07/28/2022   TIBC 182 (L) 07/28/2022   FERRITIN 2,572 (H) 07/28/2022   Lab Results  Component Value Date   VITAMINB12 221 04/28/2022   Lab Results  Component Value Date   FOLATE 28.3 04/28/2022    Imaging Studies   XR KNEE 3 VIEW LEFT  Result Date: 07/26/2022 Standing AP both knees lateral left knee sunrise patella x-ray obtained and reviewed this shows minimal joint line narrowing slight osteopenia.  No significant degenerative changes no acute bony changes. Impression: Left knee negative for acute change or significant chronic changes.  ECHOCARDIOGRAM COMPLETE BUBBLE STUDY  Result Date: 07/09/2022    ECHOCARDIOGRAM REPORT   Patient Name:   [facility] A Sassi Date of Exam: 07/08/2022 Medical Rec #:  [id number]     Height:       64.0 in Accession #:    [id number]    Weight:       110.0 lb Date of Birth:  05/01/1956      BSA:          1.517 m Patient Age:    26 years      BP:           118/78 mmHg Patient Gender: F             HR:           79 bpm. Exam Location:  [facility] Procedure: 2D Echo, Cardiac Doppler, Color Doppler and [place]ine Contrast Bubble            Study Indications:    Stroke  History:        Patient has no prior history of Echocardiogram examinations.                 COPD; Risk Factors:Dyslipidemia.  Sonographer:    Clayton Lefort RDCS (AE) Referring Phys: [id number] [place]  Sonographer Comments: Technically difficult study due to poor echo windows. IMPRESSIONS  1. Left ventricular ejection fraction, by estimation, is 60 to 65%. The left ventricle has  normal function. Left ventricular endocardial border not optimally defined to evaluate regional wall motion. Left ventricular diastolic parameters are consistent with Grade I diastolic dysfunction (impaired relaxation).  2. Right ventricular systolic function is normal. The right ventricular size is normal. Tricuspid regurgitation signal is inadequate for assessing PA pressure.  3. The mitral valve is grossly normal. No evidence of mitral valve regurgitation. No evidence of mitral stenosis.  4. The aortic valve is abnormal. Unable to determine aortic valve morphology due to image quality. There is moderate calcification of the aortic valve. Aortic valve regurgitation is not visualized. No aortic stenosis is present, however Doppler assessment for AS was suboptimal. Consider follow up echocardiogram as outpatient to reassess.  5. The inferior vena cava is normal in size with greater than 50% respiratory variability, suggesting right atrial pressure of 3 mmHg.  6. Agitated saline contrast bubble study was negative, with no evidence of any interatrial shunt. FINDINGS  Left Ventricle: Left ventricular ejection  fraction, by estimation, is 60 to 65%. The left ventricle has normal function. Left ventricular endocardial border not optimally defined to evaluate regional wall motion. The left ventricular internal cavity size was normal in size. There is no left ventricular hypertrophy. Left ventricular diastolic parameters are consistent with Grade I diastolic dysfunction (impaired relaxation). Right Ventricle: The right ventricular size is normal. No increase in right ventricular wall thickness. Right ventricular systolic function is normal. Tricuspid regurgitation signal is inadequate for assessing PA pressure. Left Atrium: Left atrial size was normal in size. Right Atrium: Right atrial size was normal in size. Pericardium: There is no evidence of pericardial effusion. Mitral Valve: The mitral valve is grossly normal. Mild  mitral annular calcification. No evidence of mitral valve regurgitation. No evidence of mitral valve stenosis. Tricuspid Valve: The tricuspid valve is normal in structure. Tricuspid valve regurgitation is trivial. No evidence of tricuspid stenosis. Aortic Valve: The aortic valve is abnormal. There is moderate calcification of the aortic valve. Aortic valve regurgitation is not visualized. No aortic stenosis is present. Aortic valve mean gradient measures 3.0 mmHg. Aortic valve peak gradient measures 6.2 mmHg. Aortic valve area, by VTI measures 2.77 cm. Pulmonic Valve: The pulmonic valve was normal in structure. Pulmonic valve regurgitation is not visualized. No evidence of pulmonic stenosis. Aorta: The ascending aorta was not well visualized. Venous: The inferior vena cava is normal in size with greater than 50% respiratory variability, suggesting right atrial pressure of 3 mmHg. IAS/Shunts: No atrial level shunt detected by color flow Doppler. Agitated saline contrast was given intravenously to evaluate for intracardiac shunting. Agitated saline contrast bubble study was negative, with no evidence of any interatrial shunt.  LEFT VENTRICLE PLAX 2D LVIDd:         4.40 cm   Diastology LVIDs:         2.60 cm   LV e' medial:    6.96 cm/s LV PW:         1.00 cm   LV E/e' medial:  11.6 LV IVS:        0.90 cm   LV e' lateral:   10.80 cm/s LVOT diam:     2.10 cm   LV E/e' lateral: 7.5 LV SV:         59 LV SV Index:   39 LVOT Area:     3.46 cm  RIGHT VENTRICLE             IVC RV Basal diam:  2.70 cm     IVC diam: 1.20 cm RV S prime:     12.00 cm/s TAPSE (M-mode): 1.6 cm LEFT ATRIUM           Index        RIGHT ATRIUM           Index LA diam:      3.00 cm 1.98 cm/m   RA Area:     10.20 cm LA Vol (A2C): 18.1 ml 11.93 ml/m  RA Volume:   18.00 ml  11.86 ml/m LA Vol (A4C): 16.0 ml 10.54 ml/m  AORTIC VALVE AV Area (Vmax):    2.96 cm AV Area (Vmean):   2.78 cm AV Area (VTI):     2.77 cm AV Vmax:           124.00 cm/s AV  Vmean:          81.400 cm/s AV VTI:            0.211 m AV Peak Grad:  6.2 mmHg AV Mean Grad:      3.0 mmHg LVOT Vmax:         106.00 cm/s LVOT Vmean:        65.400 cm/s LVOT VTI:          0.169 m LVOT/AV VTI ratio: 0.80  AORTA Ao Root diam: 3.00 cm Ao Asc diam:  3.00 cm MITRAL VALVE MV Area (PHT): 3.63 cm     SHUNTS MV Decel Time: 209 msec     Systemic VTI:  0.17 m MV E velocity: 81.00 cm/s   Systemic Diam: 2.10 cm MV A velocity: 107.00 cm/s MV E/A ratio:  0.76 Cherlynn Kaiser MD Electronically signed by Cherlynn Kaiser MD Signature Date/Time: 07/09/2022/11:08:56 AM    Final    US Carotid Bilateral  Result Date: 07/08/2022 CLINICAL DATA:  Speech difficulty and facial droop. Acute infarct in right posterior limb of internal capsule and posterior lentiform nucleus. EXAM: BILATERAL CAROTID DUPLEX ULTRASOUND TECHNIQUE: Pearline Cables scale imaging, color Doppler and duplex ultrasound were performed of bilateral carotid and vertebral arteries in the neck. COMPARISON:  Brain MRI 07/08/2022 FINDINGS: Criteria: Quantification of carotid stenosis is based on velocity parameters that correlate the residual internal carotid diameter with NASCET-based stenosis levels, using the diameter of the distal internal carotid lumen as the denominator for stenosis measurement. The following velocity measurements were obtained: RIGHT ICA: 139/42 cm/sec CCA: 97/35 cm/sec SYSTOLIC ICA/CCA RATIO:  1.6 ECA: 169 cm/sec LEFT ICA: 193/74 cm/sec CCA: 32/99 cm/sec SYSTOLIC ICA/CCA RATIO:  2.8 ECA: 66 cm/sec RIGHT CAROTID ARTERY: Large focus of echogenic plaque at the right carotid bulb with posterior acoustic shadowing. External carotid artery is patent with normal waveform. Peak systolic velocity in the proximal internal carotid artery is mildly elevated measuring 139 cm/sec. Mid and distal right internal carotid artery are patent but tortuous. RIGHT VERTEBRAL ARTERY: Antegrade flow and normal waveform in the right vertebral artery. LEFT CAROTID  ARTERY: Large amount of echogenic plaque and posterior acoustic shadowing at the left carotid bulb. External carotid artery is patent with normal waveform. Irregular plaque in the proximal internal carotid artery. Elevated peak systolic velocity in the proximal internal carotid artery measuring 193 cm/sec. Mid and distal left internal carotid artery are patent. LEFT VERTEBRAL ARTERY: Antegrade flow and normal waveform in the left vertebral artery. IMPRESSION: 1. Atherosclerotic plaque involving bilateral carotid arteries most prominent at the carotid bulbs. Estimated degree of stenosis in the internal carotid arteries is 50-69% bilaterally. 2. Patent vertebral arteries with antegrade flow. Electronically Signed   By: Markus Daft M.D.   On: 07/08/2022 14:24   MR BRAIN WO CONTRAST  Result Date: 07/08/2022 CLINICAL DATA:  Left facial asymmetry, slow and slurred speech EXAM: MRI HEAD WITHOUT CONTRAST MRA HEAD WITHOUT CONTRAST TECHNIQUE: Multiplanar, multi-echo pulse sequences of the brain and surrounding structures were acquired without intravenous contrast. Angiographic images of the Circle of Willis were acquired using MRA technique without intravenous contrast. COMPARISON:  None Available. FINDINGS: MRI HEAD FINDINGS Brain: Restricted diffusion with ADC correlate in the right posterior limb of the internal capsule and posterior lentiform nucleus (series 5, image 28). No acute hemorrhage, mass, mass effect, or midline shift. No hydrocephalus or extra-axial collection. Scattered T2 hyperintense signal in the periventricular white matter, likely the sequela of mild chronic small vessel ischemic disease. Normal pituitary and craniocervical junction. Vascular: Please see MRA findings below. Skull and upper cervical spine: Normal marrow signal. Sinuses/Orbits: Mild mucosal thickening in the ethmoid air cells. Status post bilateral lens replacements.  Other: Trace fluid in the mastoid air cells. MRA HEAD FINDINGS  Evaluation is somewhat limited by motion artifact. Anterior circulation: Both internal carotid arteries are patent to the termini, with mild stenosis in the right-greater-than-left cavernous ICA and supraclinoid ICA. A1 segments patent, with mild stenosis suspected in the distal right A1. Anterior cerebral arteries are somewhat irregular but patent to their distal aspects. No M1 stenosis or occlusion. Distal MCA branches are somewhat irregular but perfused to their distal aspects. Posterior circulation: Vertebral arteries patent to the vertebrobasilar junction without stenosis. Posterior inferior cerebral artery patent on the left. Prominent right AICA. Basilar patent to its distal aspect. Superior cerebellar arteries patent bilaterally. Patent P1 segments. PCAs perfused to their distal aspects without significant stenosis. The bilateral posterior communicating arteries are not visualized. Anatomic variants: None significant IMPRESSION: 1. 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 2. No intracranial large vessel occlusion. Mild stenosis in the right-greater-than-left cavernous and supraclinoid ICAs. These results were called by telephone at the time of interpretation on 07/08/2022 at 11:28 am to provider Carmin Muskrat , who verbally acknowledged these results. Electronically Signed   By: Merilyn Baba M.D.   On: 07/08/2022 11:28   MR ANGIO HEAD WO CONTRAST  Result Date: 07/08/2022 CLINICAL DATA:  Left facial asymmetry, slow and slurred speech EXAM: MRI HEAD WITHOUT CONTRAST MRA HEAD WITHOUT CONTRAST TECHNIQUE: Multiplanar, multi-echo pulse sequences of the brain and surrounding structures were acquired without intravenous contrast. Angiographic images of the Circle of Willis were acquired using MRA technique without intravenous contrast. COMPARISON:  None Available. FINDINGS: MRI HEAD FINDINGS Brain: Restricted diffusion with ADC correlate in the right  posterior limb of the internal capsule and posterior lentiform nucleus (series 5, image 28). No acute hemorrhage, mass, mass effect, or midline shift. No hydrocephalus or extra-axial collection. Scattered T2 hyperintense signal in the periventricular white matter, likely the sequela of mild chronic small vessel ischemic disease. Normal pituitary and craniocervical junction. Vascular: Please see MRA findings below. Skull and upper cervical spine: Normal marrow signal. Sinuses/Orbits: Mild mucosal thickening in the ethmoid air cells. Status post bilateral lens replacements. Other: Trace fluid in the mastoid air cells. MRA HEAD FINDINGS Evaluation is somewhat limited by motion artifact. Anterior circulation: Both internal carotid arteries are patent to the termini, with mild stenosis in the right-greater-than-left cavernous ICA and supraclinoid ICA. A1 segments patent, with mild stenosis suspected in the distal right A1. Anterior cerebral arteries are somewhat irregular but patent to their distal aspects. No M1 stenosis or occlusion. Distal MCA branches are somewhat irregular but perfused to their distal aspects. Posterior circulation: Vertebral arteries patent to the vertebrobasilar junction without stenosis. Posterior inferior cerebral artery patent on the left. Prominent right AICA. Basilar patent to its distal aspect. Superior cerebellar arteries patent bilaterally. Patent P1 segments. PCAs perfused to their distal aspects without significant stenosis. The bilateral posterior communicating arteries are not visualized. Anatomic variants: None significant IMPRESSION: 1. 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 2. No intracranial large vessel occlusion. Mild stenosis in the right-greater-than-left cavernous and supraclinoid ICAs. These results were called by telephone at the time of interpretation on 07/08/2022 at 11:28 am to provider Carmin Muskrat , who  verbally acknowledged these results. Electronically Signed   By: Merilyn Baba M.D.   On: 07/08/2022 11:28    Assessment       PLAN   ***   Laureen Ochs. Bobby Rumpf,  MHS, PA-C [facility]

## 2022-08-02 ENCOUNTER — Encounter: Payer: Self-pay | Admitting: Gastroenterology

## 2022-08-02 ENCOUNTER — Other Ambulatory Visit: Payer: Self-pay | Admitting: *Deleted

## 2022-08-02 ENCOUNTER — Ambulatory Visit (INDEPENDENT_AMBULATORY_CARE_PROVIDER_SITE_OTHER): Payer: Medicare Other | Admitting: Gastroenterology

## 2022-08-02 VITALS — BP 104/68 | HR 76 | Temp 97.7°F | Ht 64.0 in | Wt 106.2 lb

## 2022-08-02 DIAGNOSIS — D509 Iron deficiency anemia, unspecified: Secondary | ICD-10-CM | POA: Diagnosis not present

## 2022-08-02 LAB — ANAEROBIC AND AEROBIC CULTURE
AER RESULT:: NO GROWTH
MICRO NUMBER:: 14130250
MICRO NUMBER:: 14130251
SPECIMEN QUALITY:: ADEQUATE
SPECIMEN QUALITY:: ADEQUATE

## 2022-08-02 LAB — SYNOVIAL FLUID ANALYSIS, COMPLETE
Basophils, %: 0 %
Eosinophils-Synovial: 0 % (ref 0–2)
Lymphocytes-Synovial Fld: 3 % (ref 0–74)
Monocyte/Macrophage: 1 % (ref 0–69)
Neutrophil, Synovial: 96 % — ABNORMAL HIGH (ref 0–24)
Synoviocytes, %: 0 % (ref 0–15)
WBC, Synovial: 52300 cells/uL — ABNORMAL HIGH (ref <150)

## 2022-08-02 NOTE — Patient Instructions (Signed)
Small bowel capsule study to be scheduled.  Compare medication list provided today. Call and let us know if we need to make any corrections.

## 2022-08-03 ENCOUNTER — Encounter: Payer: Self-pay | Admitting: *Deleted

## 2022-08-03 ENCOUNTER — Telehealth: Payer: Self-pay | Admitting: *Deleted

## 2022-08-03 DIAGNOSIS — N182 Chronic kidney disease, stage 2 (mild): Secondary | ICD-10-CM | POA: Diagnosis not present

## 2022-08-03 DIAGNOSIS — K219 Gastro-esophageal reflux disease without esophagitis: Secondary | ICD-10-CM | POA: Diagnosis not present

## 2022-08-03 DIAGNOSIS — I700 Atherosclerosis of aorta: Secondary | ICD-10-CM | POA: Diagnosis not present

## 2022-08-03 DIAGNOSIS — Z7982 Long term (current) use of aspirin: Secondary | ICD-10-CM | POA: Diagnosis not present

## 2022-08-03 DIAGNOSIS — E538 Deficiency of other specified B group vitamins: Secondary | ICD-10-CM | POA: Diagnosis not present

## 2022-08-03 DIAGNOSIS — G8929 Other chronic pain: Secondary | ICD-10-CM | POA: Diagnosis not present

## 2022-08-03 DIAGNOSIS — I69354 Hemiplegia and hemiparesis following cerebral infarction affecting left non-dominant side: Secondary | ICD-10-CM | POA: Diagnosis not present

## 2022-08-03 DIAGNOSIS — M545 Low back pain, unspecified: Secondary | ICD-10-CM | POA: Diagnosis not present

## 2022-08-03 DIAGNOSIS — F32A Depression, unspecified: Secondary | ICD-10-CM | POA: Diagnosis not present

## 2022-08-03 DIAGNOSIS — E1140 Type 2 diabetes mellitus with diabetic neuropathy, unspecified: Secondary | ICD-10-CM | POA: Diagnosis not present

## 2022-08-03 DIAGNOSIS — J449 Chronic obstructive pulmonary disease, unspecified: Secondary | ICD-10-CM | POA: Diagnosis not present

## 2022-08-03 DIAGNOSIS — I69328 Other speech and language deficits following cerebral infarction: Secondary | ICD-10-CM | POA: Diagnosis not present

## 2022-08-03 DIAGNOSIS — E785 Hyperlipidemia, unspecified: Secondary | ICD-10-CM | POA: Diagnosis not present

## 2022-08-03 DIAGNOSIS — E559 Vitamin D deficiency, unspecified: Secondary | ICD-10-CM | POA: Diagnosis not present

## 2022-08-03 DIAGNOSIS — Z7902 Long term (current) use of antithrombotics/antiplatelets: Secondary | ICD-10-CM | POA: Diagnosis not present

## 2022-08-03 DIAGNOSIS — E1122 Type 2 diabetes mellitus with diabetic chronic kidney disease: Secondary | ICD-10-CM | POA: Diagnosis not present

## 2022-08-03 DIAGNOSIS — M069 Rheumatoid arthritis, unspecified: Secondary | ICD-10-CM | POA: Diagnosis not present

## 2022-08-03 DIAGNOSIS — Z7969 Long term (current) use of other immunomodulators and immunosuppressants: Secondary | ICD-10-CM | POA: Diagnosis not present

## 2022-08-03 DIAGNOSIS — E039 Hypothyroidism, unspecified: Secondary | ICD-10-CM | POA: Diagnosis not present

## 2022-08-03 DIAGNOSIS — I129 Hypertensive chronic kidney disease with stage 1 through stage 4 chronic kidney disease, or unspecified chronic kidney disease: Secondary | ICD-10-CM | POA: Diagnosis not present

## 2022-08-03 NOTE — Telephone Encounter (Signed)
Pt informed that procedure is scheduled for 08/12/22, arrive a 7am at [facility]. Instructions sent via MyChart.

## 2022-08-03 NOTE — Progress Notes (Signed)
[place], [place] [address]   CLINIC:  Medical Oncology/Hematology  PCP:  Claretta Fraise, MD [place] [address] [phone]   INTERVAL HISTORY:  [name] 66 y.o. female returns today for follow-up of her normocytic anemia.  She was last seen by Tarri Abernethy PA-C on 05/12/2022.  She received Feraheme on 05/18/2022 and 06/17/2022.  Since her last visit, patient was hospitalized for acute CVA from 07/08/2022 through 07/10/2022.  She was transfused PRBC on 07/09/2022 (2 units PRBC), also received 2 doses IV iron while hospitalized  She reports that she has fatigue that "comes and goes," ever since her stroke and hospitalization.  She continues to have some residual left-sided weakness, but feels that this is slowly getting better.  Continues to deny any bright red blood per rectum, melena, epistaxis, or other signs of blood loss.  She was started on oral iron supplement at hospital discharge, reports dark stool since starting iron.   She has dyspnea on exertion which she reports is chronic and stable. She denies any pica, restless legs, headaches, lightheadedness, syncope, or chest pain.  No B symptoms such as fever, chills, night sweats, unintentional weight loss.  She reports improved appetite after starting Megace, and her weight is stable compared to her visit 3 months ago.  REVIEW OF SYSTEMS:      Review of Systems  Constitutional:  Positive for fatigue. Negative for appetite change, chills, diaphoresis, fever and unexpected weight change.  HENT:   Negative for lump/mass and nosebleeds.   Eyes:  Negative for eye problems.  Respiratory:  Positive for shortness of breath. Negative for cough and hemoptysis.   Cardiovascular:  Negative for chest pain, leg swelling and palpitations.  Gastrointestinal:  Positive for constipation (since starting iron). Negative for abdominal pain, blood in stool, diarrhea, nausea and vomiting.   Genitourinary:  Negative for hematuria.   Musculoskeletal:  Positive for arthralgias and back pain.  Skin: Negative.   Neurological:  Positive for extremity weakness. Negative for dizziness, headaches and light-headedness.  Hematological:  Does not bruise/bleed easily.      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 Posey Pronto: mild diverticulsis in sigmoid colon.    COLONOSCOPY WITH PROPOFOL N/A 09/16/2019   Rourk: Diverticulosis, random colon biopsies negative  for microscopic colitis.   COLONOSCOPY WITH PROPOFOL N/A 06/08/2020   Procedure: COLONOSCOPY WITH PROPOFOL;  Surgeon: Daneil Dolin, MD;  [facility];  Service: Endoscopy;  Laterality: N/A;  9:15am   ESOPHAGOGASTRODUODENOSCOPY (EGD) WITH PROPOFOL N/A 04/28/2020   Procedure: ESOPHAGOGASTRODUODENOSCOPY (EGD) WITH PROPOFOL;  Surgeon: Daneil Dolin, MD;  [facility];  Service: Endoscopy;  Laterality: N/A;  2:83   YAG LASER APPLICATION Left 66/29/4765   Procedure: YAG LASER APPLICATION;  Surgeon: Williams Che, MD;  Location: [facility];  Service: Ophthalmology;  Laterality: Left;     SOCIAL HISTORY:  Social History   Socioeconomic History   Marital status: Widowed    Spouse name: Not on file   Number of children: 1   Years of education: Not on file   Highest education level: Not on file  Occupational History   Occupation: DISABLED  Tobacco Use   Smoking status: Former    Packs/day: 1.00    Years: 15.00    Total pack years: 15.00    Types: Cigarettes    Quit date: 09/11/2017    Years since quitting: 4.8   Smokeless tobacco: Former  Scientific laboratory technician Use: Never used  Substance and Sexual Activity   Alcohol use: Never   Drug use: Never   Sexual activity: Not Currently  Other Topics Concern   Not on file  Social History Narrative   Not on file   Social Determinants of Health   Financial Resource Strain: Low Risk  (07/11/2022)   Overall Financial Resource Strain (CARDIA)    Difficulty of Paying Living Expenses: Not hard at all  Food Insecurity: No Food Insecurity (07/08/2022)   Hunger Vital Sign    Worried About Running Out of Food in the Last Year: Never true    [place] in the Last Year: Never true  Transportation Needs: No Transportation Needs (07/11/2022)   PRAPARE - Hydrologist (Medical): No    Lack of Transportation (Non-Medical): No  Physical Activity: Insufficiently Active (09/03/2020)   Exercise Vital Sign    Days of Exercise per Week: 3 days    Minutes of Exercise per Session: 30 min  Stress: No Stress Concern Present (09/03/2020)   [place]    Feeling of Stress : Only a little  Social Connections: Socially Isolated (09/03/2020)   Social Connection and Isolation Panel [NHANES]    Frequency of  Communication with Friends and Family: More than three times a week    Frequency of Social Gatherings with Friends and Family: More than three times a week    Attends Religious Services: Never    Marine scientist or Organizations: No    Attends Archivist Meetings: Never    Marital Status: Widowed  Intimate Partner Violence: Not At Risk (07/08/2022)   Humiliation, Afraid, Rape, and Kick questionnaire    Fear of Current or Ex-Partner: No    Emotionally Abused: No    Physically Abused: No    Sexually Abused: No    FAMILY HISTORY:  Family History  Problem Relation Age of Onset   Stroke Mother    Heart disease Mother    Diabetes Mother    Breast cancer Mother    Other Father        stomach taken out for some reason   Migraines Daughter    Colon cancer Neg Hx  CURRENT MEDICATIONS:  Outpatient Encounter Medications as of 08/04/2022  Medication Sig   albuterol (VENTOLIN HFA) 108 (90 Base) MCG/ACT inhaler Inhale 2 puffs into the lungs every 6 (six) hours as needed for wheezing or shortness of breath.   aspirin EC 81 MG tablet Take 1 tablet (81 mg total) by mouth daily. Swallow whole.   calcium carbonate (OSCAL) 1500 (600 Ca) MG TABS tablet Take 1,500 mg by mouth daily with breakfast.   citalopram (CELEXA) 40 MG tablet TAKE ONE TABLET ONCE DAILY   clopidogrel (PLAVIX) 75 MG tablet Take 75 mg by mouth once. 21 days until sees neurology   diclofenac Sodium (VOLTAREN) 1 % GEL Apply 4 g topically 4 (four) times daily.   ferrous sulfate 325 (65 FE) MG tablet Take 1 tablet (325 mg total) by mouth 2 (two) times daily with a meal.   folic acid (FOLVITE) 1 MG tablet Take 1 tablet (1 mg total) by mouth daily.   gabapentin (NEURONTIN) 300 MG capsule Take 1 capsule (300 mg total) by mouth 3 (three) times daily.   hydroxychloroquine (PLAQUENIL) 200 MG tablet Take 200 mg by mouth daily.    leflunomide (ARAVA) 20 MG tablet 1 tablet Orally Once a day for RA for 30 days    levothyroxine (SYNTHROID) 125 MCG tablet Take 1 tablet (125 mcg total) by mouth every morning.   megestrol (MEGACE) 400 MG/10ML suspension Take 10 mLs (400 mg total) by mouth 2 (two) times daily.   pantoprazole (PROTONIX) 40 MG tablet TAKE ONE TABLET BY MOUTH DAILY BEFORE BREAKFAST (Patient taking differently: Take 40 mg by mouth daily.)   potassium chloride SA (KLOR-CON M) 20 MEQ tablet TAKE ONE TABLET ONCE DAILY, NEED DOCTOR APPT. (Patient taking differently: Take 20 mEq by mouth daily.)   rosuvastatin (CRESTOR) 40 MG tablet Take 1 tablet (40 mg total) by mouth daily. For cholesterol   sulfaSALAzine (AZULFIDINE) 500 MG tablet Take 500 mg by mouth 2 (two) times daily.   traZODone (DESYREL) 100 MG tablet Take 1 tablet (100 mg total) by mouth at bedtime as needed. for sleep (Patient taking differently: Take 100 mg by mouth at bedtime. for sleep)   No facility-administered encounter medications on file as of 08/04/2022.    ALLERGIES:  Allergies  Allergen Reactions   Adalimumab     Other reaction(s): no response Pt is unaware of this Other reaction(s): no response   Methotrexate Other (See Comments)    Other reaction(s): GI side effects   Upadacitinib Other (See Comments)    Other reaction(s): GI side effects    PHYSICAL EXAM:      ECOG PERFORMANCE STATUS: 1 - Symptomatic but completely ambulatory  There were no vitals filed for this visit. There were no vitals filed for this visit. Physical Exam Vitals reviewed.  Constitutional:      Appearance: Normal appearance.  Cardiovascular:     Rate and Rhythm: Normal rate and regular rhythm.     Pulses: Normal pulses.     Heart sounds: Normal heart sounds.  Pulmonary:     Effort: Pulmonary effort is normal.     Breath sounds: Normal breath sounds.  Neurological:     General: No focal deficit present.     Mental Status: She is alert and oriented to person, place, and time.     Cranial Nerves: Cranial nerve deficit (Slight left facial  hemiparesis, left-sided tongue deviation) present.     Motor: Weakness (Mild residual left upper extremity weakness following CVA) present.  Psychiatric:  Mood and Affect: Mood normal.        Behavior: Behavior normal.     LABORATORY DATA:  I have reviewed the labs as listed.  CBC    Component Value Date/Time   WBC 5.0 07/28/2022 0955   RBC 3.60 (L) 07/28/2022 0955   RBC 3.59 (L) 07/28/2022 0955   HGB 10.0 (L) 07/28/2022 0955   HGB 7.7 (LL) 04/20/2022 1103   HCT 34.2 (L) 07/28/2022 0955   HCT 25.4 (L) 04/20/2022 1103   PLT 364 07/28/2022 0955   PLT 453 (H) 04/20/2022 1103   MCV 95.3 07/28/2022 0955   MCV 89 04/20/2022 1103   MCH 27.9 07/28/2022 0955   MCHC 29.2 (L) 07/28/2022 0955   RDW 14.2 07/28/2022 0955   RDW 14.5 04/20/2022 1103   LYMPHSABS 2.2 07/28/2022 0955   LYMPHSABS 1.8 04/20/2022 1103   MONOABS 0.4 07/28/2022 0955   EOSABS 0.0 07/28/2022 0955   EOSABS 0.2 04/20/2022 1103   BASOSABS 0.0 07/28/2022 0955   BASOSABS 0.0 04/20/2022 1103      Latest Ref Rng & Units 07/28/2022    9:55 AM 07/10/2022    5:10 AM 07/09/2022    4:57 AM  CMP  Glucose 70 - 99 mg/dL 171  114  109   BUN 8 - 23 mg/dL '11  8  7   '$ Creatinine 0.44 - 1.00 mg/dL 0.90  0.69  0.72   Sodium 135 - 145 mmol/L 138  140  140   Potassium 3.5 - 5.1 mmol/L 4.3  3.3  3.4   Chloride 98 - 111 mmol/L 106  115  115   CO2 22 - 32 mmol/L '23  20  20   '$ Calcium 8.9 - 10.3 mg/dL 9.0  7.7  7.5   Total Protein 6.5 - 8.1 g/dL 6.7     Total Bilirubin 0.3 - 1.2 mg/dL 0.4     Alkaline Phos 38 - 126 U/L 70     AST 15 - 41 U/L 10     ALT 0 - 44 U/L 6       DIAGNOSTIC IMAGING:  I have independently reviewed the relevant imaging and discussed with the patient.  ASSESSMENT & PLAN: 1.  Acute on chronic normocytic anemia - CBC on 03/20/2020 shows hemoglobin 9.9 with MCV of 93.  White count and platelets were normal. - Colonoscopy on 09/16/2019 shows diverticulosis in the entire examined colon.  Nonbleeding  internal hemorrhoids. -  BMBX on 07/19/2021: Mildly hypercellular marrow with trilineage hematopoiesis.  No increased ring sideroblasts or blasts.  3% polytypic plasma cells.  No large aggregates of plasma cells.  Iron stores increased.  Chromosome analysis normal. - Stool for occult blood was negative x3 in May 2023, negative x3 in August 2023. - She denies any history of blood transfusion.  She does not take aspirin or any blood thinners. - Labs checked by PCP on 04/20/2022 showed acute worsening of her anemia with Hgb 7.7 (down from 9.2 on 03/07/2022). - PRBC x2 during hospitalization in October 2023 for CVA, Hgb 6.6 - Iron panel by PCP (04/20/2022) showed elevated ferritin 679 (acute phase reactant in the setting of RA?),  with low iron saturation 7% and low serum iron 15, low TIBC 214 - Additional heme work-up (04/28/2022): Hgb 7.4/MCV 95.6, platelets 429, normal differential. Normal kidney function with creatinine 0.85 and GFR >60 Elevated reticulocytes at 3.5%.  Normal haptoglobin, LDH, bilirubin. Borderline vitamin B12 at 221 with methylmalonic acid normal at 99.  Normal folate.  -  She is taking iron tablet and vitamin B12 tablets at home. - Trial of monthly Feraheme x2 doses did NOT improve her hemoglobin. - Chronic fatigue is at baseline - No bright red blood per rectum or melena  - Labs (07/28/2022): Hgb 10.0/MCV 95.3, ferritin 2572, iron saturation 62%.  Creatinine 0.90/GFR >60, reticulocytes 1.5%, normal LDH. - DIFFERENTIAL DIAGNOSIS: Anemia of chronic disease with functional iron deficiency in the setting of rheumatoid arthritis.  She may also have some myelosuppression secondary to her RA medications (leflunomide, hydroxychloroquine, sulfasalazine). - PLAN: Patient instructed to STOP iron supplement due to elevated ferritin and iron saturation levels. - Continue vitamin B12 supplement. - Monthly CBC with blood bank sample - Patient educated on alarm symptoms that would prompt immediate  medical attention/ED visit.  - We will reach out to patient's rheumatologist Leafy Kindle, PA-C) to discuss possible dose reductions in RA medications. - Labs in 2 months (we will also check systemic to evaluate for occult kidney disease) - Office visit 1 week after labs - Repeat labs and RTC as scheduled in October 2023.  2.  Other issues:  Patient also follows with clinic for the following issues, which will be addressed in more detail at follow-up visit.  (Addressed by Dr. Delton Coombes on 03/14/2022) Bence-Jones proteinuria ((we will check MGUS/myeloma panel and 24-hour urine follow-up visit in 2 months) Weight loss: Improved appetite and improved weight after starting Megace. Rheumatoid arthritis Hypokalemia Depression Osteoporosis: Next Zometa due around 02/23/2023   All questions were answered. The patient knows to call the clinic with any problems, questions or concerns.  Medical decision making: Moderate    Time spent on visit: I spent 20 minutes counseling the patient face to face. The total time spent in the appointment was 30 minutes and more than 50% was on counseling.   Erica Rush, PA-C  08/07/22 12:23 AM

## 2022-08-04 ENCOUNTER — Encounter: Payer: Self-pay | Admitting: Family Medicine

## 2022-08-04 ENCOUNTER — Encounter: Payer: Self-pay | Admitting: Neurology

## 2022-08-04 ENCOUNTER — Ambulatory Visit (INDEPENDENT_AMBULATORY_CARE_PROVIDER_SITE_OTHER): Payer: Medicare Other | Admitting: Neurology

## 2022-08-04 ENCOUNTER — Inpatient Hospital Stay (HOSPITAL_BASED_OUTPATIENT_CLINIC_OR_DEPARTMENT_OTHER): Payer: Medicare Other | Admitting: Physician Assistant

## 2022-08-04 ENCOUNTER — Encounter: Payer: Self-pay | Admitting: Physician Assistant

## 2022-08-04 VITALS — BP 103/66 | HR 76 | Ht 64.0 in | Wt 107.0 lb

## 2022-08-04 DIAGNOSIS — Z7982 Long term (current) use of aspirin: Secondary | ICD-10-CM | POA: Diagnosis not present

## 2022-08-04 DIAGNOSIS — I63511 Cerebral infarction due to unspecified occlusion or stenosis of right middle cerebral artery: Secondary | ICD-10-CM

## 2022-08-04 DIAGNOSIS — E538 Deficiency of other specified B group vitamins: Secondary | ICD-10-CM | POA: Diagnosis not present

## 2022-08-04 DIAGNOSIS — E039 Hypothyroidism, unspecified: Secondary | ICD-10-CM

## 2022-08-04 DIAGNOSIS — D649 Anemia, unspecified: Secondary | ICD-10-CM

## 2022-08-04 DIAGNOSIS — E876 Hypokalemia: Secondary | ICD-10-CM | POA: Diagnosis not present

## 2022-08-04 DIAGNOSIS — F32A Depression, unspecified: Secondary | ICD-10-CM | POA: Diagnosis not present

## 2022-08-04 DIAGNOSIS — I69354 Hemiplegia and hemiparesis following cerebral infarction affecting left non-dominant side: Secondary | ICD-10-CM | POA: Diagnosis not present

## 2022-08-04 DIAGNOSIS — R803 Bence Jones proteinuria: Secondary | ICD-10-CM | POA: Diagnosis not present

## 2022-08-04 DIAGNOSIS — M810 Age-related osteoporosis without current pathological fracture: Secondary | ICD-10-CM | POA: Diagnosis not present

## 2022-08-04 DIAGNOSIS — R5382 Chronic fatigue, unspecified: Secondary | ICD-10-CM | POA: Diagnosis not present

## 2022-08-04 DIAGNOSIS — Z803 Family history of malignant neoplasm of breast: Secondary | ICD-10-CM | POA: Diagnosis not present

## 2022-08-04 DIAGNOSIS — Z87891 Personal history of nicotine dependence: Secondary | ICD-10-CM | POA: Diagnosis not present

## 2022-08-04 DIAGNOSIS — M069 Rheumatoid arthritis, unspecified: Secondary | ICD-10-CM | POA: Diagnosis not present

## 2022-08-04 DIAGNOSIS — Z79899 Other long term (current) drug therapy: Secondary | ICD-10-CM | POA: Diagnosis not present

## 2022-08-04 MED ORDER — VITAMIN B-12 1000 MCG PO TABS: 1000.0000 ug | ORAL_TABLET | Freq: Every day | ORAL | 3 refills | Status: DC

## 2022-08-04 NOTE — Patient Instructions (Addendum)
Continue current medications  Add Vitamin B12 supplement daily  Continue with physical therapy  Follow up with PCP regarding abnormal TSH  Return in a year, at that time, we will repeat Carotid US

## 2022-08-04 NOTE — Patient Instructions (Signed)
[facility] **   You were seen today by Tarri Abernethy PA-C for your anemia.    ANEMIA: Your anemia is suspected to be due to chronic inflammation which makes it difficult for your body to use iron to make new blood cells.  You may also have some anemia related to your medications for rheumatoid arthritis, so I will reach out to your rheumatologist to discuss this with them. Continue to take vitamin B12 as recommended by your primary care doctor. Right now, your iron levels are elevated due to the high doses of IV iron that you received while hospitalized.  You can STOP taking your iron pills right now.  We will check your iron levels again at your follow-up visit. We will continue to check your blood counts once per month. We will recheck your labs in 2 months, and also request that you complete 24-hour URINE STUDY at that time. We will see you for follow-up visit in 2 months.   ** Thank you for trusting me with your healthcare!  I strive to provide all of my patients with quality care at each visit.  If you receive a survey for this visit, I would be so grateful to you for taking the time to provide feedback.  Thank you in advance!  ~ Danylah Holden                   Dr. Derek Jack   &   Tarri Abernethy, PA-C   - - - - - - - - - - - - - - - - - -    Thank you for choosing [facility] to provide your oncology and hematology care.  To afford each patient quality time with our provider, please arrive at least 15 minutes before your scheduled appointment time.   If you have a lab appointment with the [facility] please come in thru the Main Entrance and check in at the main information desk.  You need to re-schedule your appointment should you arrive 10 or more minutes late.  We strive to give you quality time with our providers, and arriving late affects you and other  patients whose appointments are after yours.  Also, if you no show three or more times for appointments you may be dismissed from the clinic at the providers discretion.     Again, thank you for choosing [facility].  Our hope is that these requests will decrease the amount of time that you wait before being seen by our physicians.       _____________________________________________________________  Should you have questions after your visit to [facility], please contact our office at [phone] and follow the prompts.  Our office hours are 8:00 a.m. and 4:30 p.m. Monday - Friday.  Please note that voicemails left after 4:00 p.m. may not be returned until the following business day.  We are closed weekends and major holidays.  You do have access to a nurse 24-7, just call the main number to the clinic [phone] and do not press any options, hold on the line and a nurse will answer the phone.    For prescription refill requests, have your pharmacy contact our office and allow 72 hours.

## 2022-08-04 NOTE — Progress Notes (Signed)
[facility]  PATIENT: Erica Beasley DOB: 1955/10/12  REQUESTING CLINICIAN: Gwenlyn Perking, FNP HISTORY FROM: Patient and daughter  REASON FOR VISIT: Right Internal capsule stroke    HISTORICAL  CHIEF COMPLAINT:  Chief Complaint  Patient presents with   Hospitalization Follow-up    Rm 13. Accompanied by daughter. NP/internal hospital referral for CVA d/c home 10/15.    HISTORY OF PRESENT ILLNESS:  This is a 66 year old woman past medical history of COPD, hyperlipidemia hypothyroidism, vitamin B12 deficiency who is presenting for follow-up after being diagnosed with a right internal capsule stroke. Patient presented to the ED due to slurred speech, left facial weakness and left-sided weakness.  She was found to have an acute right internal capsule stroke.  Stroke etiology likely small vessel disease but cannot rule out large vessel.  She was discharged on aspirin and Plavix for total of 21 days and plan will be for patient to continue with aspirin alone.  She is doing physical therapy at home.  She reported her speech is almost back to normal and her strength is getting better.  Denies any new falls.  She is compliant with her medication.  No new concerns.   OTHER MEDICAL CONDITIONS: Hyperlipidemia, hypothyroidism, vitamin B12 deficiency, COPD, CKD,   REVIEW OF SYSTEMS: Full 14 system review of systems performed and negative with exception of: As noted in the HPI   ALLERGIES: Allergies  Allergen Reactions   Adalimumab     Other reaction(s): no response Pt is unaware of this Other reaction(s): no response   Methotrexate Other (See Comments)    Other reaction(s): GI side effects   Upadacitinib Other (See Comments)    Other reaction(s): GI side effects    HOME MEDICATIONS: Outpatient Medications Prior to Visit  Medication Sig Dispense Refill   albuterol (VENTOLIN HFA) 108 (90 Base) MCG/ACT inhaler Inhale 2 puffs into the lungs every 6 (six) hours as  needed for wheezing or shortness of breath.     aspirin EC 81 MG tablet Take 1 tablet (81 mg total) by mouth daily. Swallow whole. 30 tablet 12   calcium carbonate (OSCAL) 1500 (600 Ca) MG TABS tablet Take 1,500 mg by mouth daily with breakfast.     citalopram (CELEXA) 40 MG tablet TAKE ONE TABLET ONCE DAILY 30 tablet 5   clopidogrel (PLAVIX) 75 MG tablet Take 75 mg by mouth once. 21 days until sees neurology     diclofenac Sodium (VOLTAREN) 1 % GEL Apply 4 g topically 4 (four) times daily. 4 g 0   ferrous sulfate 325 (65 FE) MG tablet Take 1 tablet (325 mg total) by mouth 2 (two) times daily with a meal. 60 tablet 1   folic acid (FOLVITE) 1 MG tablet Take 1 tablet (1 mg total) by mouth daily. 90 tablet 3   gabapentin (NEURONTIN) 300 MG capsule Take 1 capsule (300 mg total) by mouth 3 (three) times daily. 270 capsule 1   hydroxychloroquine (PLAQUENIL) 200 MG tablet Take 200 mg by mouth daily.      leflunomide (ARAVA) 20 MG tablet 1 tablet Orally Once a day for RA for 30 days     levothyroxine (SYNTHROID) 125 MCG tablet Take 1 tablet (125 mcg total) by mouth every morning. 90 tablet 1   megestrol (MEGACE) 400 MG/10ML suspension Take 10 mLs (400 mg total) by mouth 2 (two) times daily. 480 mL 2   pantoprazole (PROTONIX) 40 MG tablet TAKE ONE TABLET BY MOUTH DAILY BEFORE BREAKFAST (Patient  taking differently: Take 40 mg by mouth daily.) 90 tablet 1   potassium chloride SA (KLOR-CON M) 20 MEQ tablet TAKE ONE TABLET ONCE DAILY, NEED DOCTOR APPT. (Patient taking differently: Take 20 mEq by mouth daily.) 30 tablet 6   rosuvastatin (CRESTOR) 40 MG tablet Take 1 tablet (40 mg total) by mouth daily. For cholesterol 90 tablet 3   sulfaSALAzine (AZULFIDINE) 500 MG tablet Take 500 mg by mouth 2 (two) times daily.     traZODone (DESYREL) 100 MG tablet Take 1 tablet (100 mg total) by mouth at bedtime as needed. for sleep (Patient taking differently: Take 100 mg by mouth at bedtime. for sleep) 90 tablet 1   No  facility-administered medications prior to visit.    PAST MEDICAL HISTORY: Past Medical History:  Diagnosis Date   Aortic atherosclerosis ([place]) 11/13/2020   CKD (chronic kidney disease) stage 2, GFR 60-89 ml/min    COPD (chronic obstructive pulmonary disease) ([place])    COVID-19 06/2019   Depression    Essential hypertension    GERD (gastroesophageal reflux disease)    H/O Clostridium difficile infection 05/03/2019   History of diabetes mellitus    Hx of colonic polyps 05/03/2019   Hyperlipidemia    Hypothyroidism    Neuropathy    Rheumatoid arthritis ([place])    Stroke (cerebrum) ([place]) 07/09/2022   Stroke ([place]) 07/08/2022   Vitamin B12 deficiency    Vitamin D deficiency     PAST SURGICAL HISTORY: Past Surgical History:  Procedure Laterality Date   BIOPSY  09/16/2019   Procedure: BIOPSY;  Surgeon: Daneil Dolin, MD;  [facility];  Service: Endoscopy;;   BIOPSY  04/28/2020   Procedure: BIOPSY;  Surgeon: Daneil Dolin, MD;  [facility];  Service: Endoscopy;;   BIOPSY  06/08/2020   Procedure: BIOPSY;  Surgeon: Daneil Dolin, MD;  [facility];  Service: Endoscopy;;   CATARACT EXTRACTION Bilateral    COLONOSCOPY  09/2010   Dr. Posey Pronto: mild diverticulsis in sigmoid colon.    COLONOSCOPY WITH PROPOFOL N/A 09/16/2019   Rourk: Diverticulosis, random colon biopsies negative for microscopic colitis.   COLONOSCOPY WITH PROPOFOL N/A 06/08/2020   Procedure: COLONOSCOPY WITH PROPOFOL;  Surgeon: Daneil Dolin, MD;  [facility];  Service: Endoscopy;  Laterality: N/A;  9:15am   ESOPHAGOGASTRODUODENOSCOPY (EGD) WITH PROPOFOL N/A 04/28/2020   Procedure: ESOPHAGOGASTRODUODENOSCOPY (EGD) WITH PROPOFOL;  Surgeon: Daneil Dolin, MD;  [facility];  Service: Endoscopy;  Laterality: N/A;  7:74   YAG LASER APPLICATION Left 12/87/8676   Procedure: YAG LASER APPLICATION;  Surgeon: Williams Che, MD;  [facility]: [facility];  Service: Ophthalmology;   Laterality: Left;    FAMILY HISTORY: Family History  Problem Relation Age of Onset   Stroke Mother    Heart disease Mother    Diabetes Mother    Breast cancer Mother    Other Father        stomach taken out for some reason   Migraines Daughter    Colon cancer Neg Hx     SOCIAL HISTORY: Social History   Socioeconomic History   Marital status: Widowed    Spouse name: Not on file   Number of children: 1   Years of education: Not on file   Highest education level: Not on file  Occupational History   Occupation: DISABLED  Tobacco Use   Smoking status: Former    Packs/day: 1.00    Years: 15.00  Total pack years: 15.00    Types: Cigarettes    Quit date: 09/11/2017    Years since quitting: 4.8   Smokeless tobacco: Former  Scientific laboratory technician Use: Never used  Substance and Sexual Activity   Alcohol use: Never   Drug use: Never   Sexual activity: Not Currently  Other Topics Concern   Not on file  Social History Narrative   Not on file   Social Determinants of Health   Financial Resource Strain: Low Risk  (07/11/2022)   Overall Financial Resource Strain (CARDIA)    Difficulty of Paying Living Expenses: Not hard at all  Food Insecurity: No Food Insecurity (07/08/2022)   Hunger Vital Sign    Worried About Running Out of Food in the Last Year: Never true    Ran Out of Food in the Last Year: Never true  Transportation Needs: No Transportation Needs (07/11/2022)   PRAPARE - Hydrologist (Medical): No    Lack of Transportation (Non-Medical): No  Physical Activity: Insufficiently Active (09/03/2020)   Exercise Vital Sign    Days of Exercise per Week: 3 days    Minutes of Exercise per Session: 30 min  Stress: No Stress Concern Present (09/03/2020)   [place]    Feeling of Stress : Only a little  Social Connections: Socially Isolated (09/03/2020)   Social Connection and  Isolation Panel [NHANES]    Frequency of Communication with Friends and Family: More than three times a week    Frequency of Social Gatherings with Friends and Family: More than three times a week    Attends Religious Services: Never    Marine scientist or Organizations: No    Attends Archivist Meetings: Never    Marital Status: Widowed  Intimate Partner Violence: Not At Risk (07/08/2022)   Humiliation, Afraid, Rape, and Kick questionnaire    Fear of Current or Ex-Partner: No    Emotionally Abused: No    Physically Abused: No    Sexually Abused: No    PHYSICAL EXAM  GENERAL EXAM/CONSTITUTIONAL: Vitals:  Vitals:   08/04/22 0754  BP: 103/66  Pulse: 76  Weight: 107 lb (48.5 kg)  Height: '5\' 4"'$  (1.626 m)   Body mass index is 18.37 kg/m. Wt Readings from Last 3 Encounters:  08/04/22 107 lb (48.5 kg)  08/02/22 106 lb 3.2 oz (48.2 kg)  07/29/22 106 lb (48.1 kg)   Patient is in no distress; well developed, nourished and groomed; neck is supple  EYES: Pupils round and reactive to light, Visual fields full to confrontation, Extraocular movements intacts,   MUSCULOSKELETAL: Gait, strength, tone, movements noted in Neurologic exam below  NEUROLOGIC: MENTAL STATUS:      No data to display         awake, alert, oriented to person, place and time recent and remote memory intact normal attention and concentration language fluent, comprehension intact, naming intact, no dysarthria fund of knowledge appropriate  CRANIAL NERVE:  2nd, 3rd, 4th, 6th - pupils equal and reactive to light, visual fields full to confrontation, extraocular muscles intact, no nystagmus 5th - facial sensation symmetric 7th - Left lower facial droop  8th - hearing intact 9th - palate elevates symmetrically, uvula midline 11th - shoulder shrug symmetric 12th - tongue protrusion midline  MOTOR:  normal bulk and tone, full strength in the BUE, BLE . There is satelliting around left  arm  SENSORY:  normal and symmetric to light touch  COORDINATION:  finger-nose-finger, difficulty with fine finger movement on the left   REFLEXES:  deep tendon reflexes present and symmetric  GAIT/STATION:   Wide based, unsteady, she is not using a device with ambulation     DIAGNOSTIC DATA (LABS, IMAGING, TESTING) - I reviewed patient records, labs, notes, testing and imaging myself where available.  Lab Results  Component Value Date   WBC 5.0 07/28/2022   HGB 10.0 (L) 07/28/2022   HCT 34.2 (L) 07/28/2022   MCV 95.3 07/28/2022   PLT 364 07/28/2022      Component Value Date/Time   NA 138 07/28/2022 0955   NA 138 08/06/2021 1433   K 4.3 07/28/2022 0955   CL 106 07/28/2022 0955   CO2 23 07/28/2022 0955   GLUCOSE 171 (H) 07/28/2022 0955   BUN 11 07/28/2022 0955   BUN 6 (L) 08/06/2021 1433   CREATININE 0.90 07/28/2022 0955   CREATININE 0.64 02/01/2019 1333   CALCIUM 9.0 07/28/2022 0955   CALCIUM 9.1 03/10/2020 1156   PROT 6.7 07/28/2022 0955   PROT 6.8 08/06/2021 1433   ALBUMIN 3.0 (L) 07/28/2022 0955   ALBUMIN 3.8 08/06/2021 1433   AST 10 (L) 07/28/2022 0955   ALT 6 07/28/2022 0955   ALKPHOS 70 07/28/2022 0955   BILITOT 0.4 07/28/2022 0955   BILITOT 0.2 08/06/2021 1433   GFRNONAA >60 07/28/2022 0955   GFRAA 72 11/13/2020 1129   Lab Results  Component Value Date   CHOL 136 07/08/2022   HDL 36 (L) 07/08/2022   LDLCALC 80 07/08/2022   TRIG 100 07/08/2022   CHOLHDL 3.8 07/08/2022   Lab Results  Component Value Date   HGBA1C 4.6 (L) 07/08/2022   Lab Results  Component Value Date   VITAMINB12 221 04/28/2022   Lab Results  Component Value Date   TSH 21.462 (H) 07/08/2022    MRI Brain/MRA Head  1. 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 2. No intracranial large vessel occlusion. Mild stenosis in the right-greater-than-left cavernous and supraclinoid ICAs   Carotid US  1. Atherosclerotic  plaque involving bilateral carotid arteries most prominent at the carotid bulbs. Estimated degree of stenosis in the internal carotid arteries is 50-69% bilaterally. 2. Patent vertebral arteries with antegrade flow.    ASSESSMENT AND PLAN  66 y.o. year old female with history of hyperlipidemia, hypothyroidism, COPD, who is presenting after being diagnosed with a right internal capsule stroke.  Stroke etiology likely small vessel disease but cannot rule out large vessel disease.  She is on DAPT, aspirin and Plavix for total of 21 days and patient is to continue with aspirin alone.  She is getting physical therapy, encouraged her to continue physical therapy and to exercise daily.  In terms of her labs, her B12 was low, she reported in the past taking B12 shots I will start her on B12 oral supplement and have her follow-up with her PCP.  Her TSH was also elevated despite being on Synthroid.  Patient reports compliance with medication.  I also advised her to follow-up with her PCP regarding the abnormal thyroid.  Continue your other medications, follow-up in 1 year, at that time we will repeat carotid ultrasound.  She voices understanding.   1. Cerebrovascular accident (CVA) due to occlusion of right middle cerebral artery ([place])   2. Vitamin B12 deficiency   3. Hypothyroidism, unspecified type      Patient  Instructions  Continue current medications  Add Vitamin B12 supplement daily  Continue with physical therapy  Follow up with PCP regarding abnormal TSH  Return in a year, at that time, we will repeat Carotid US  No orders of the defined types were placed in this encounter.   Meds ordered this encounter  Medications   cyanocobalamin (VITAMIN B12) 1000 MCG tablet    Sig: Take 1 tablet (1,000 mcg total) by mouth daily.    Dispense:  90 tablet    Refill:  3    Return in about 1 year (around 08/05/2023).  I have spent a total of 50 minutes dedicated to this patient today, preparing to  see patient, performing a medically appropriate examination and evaluation, ordering tests and/or medications and procedures, and counseling and educating the patient/family/caregiver; independently interpreting result and communicating results to the family/patient/caregiver; and documenting clinical information in the electronic medical record.   Alric Ran, MD 08/04/2022, 9:48 AM  [facility] [address], [place], [place] [address] [phone]

## 2022-08-07 ENCOUNTER — Encounter: Payer: Self-pay | Admitting: Hematology

## 2022-08-08 ENCOUNTER — Ambulatory Visit (INDEPENDENT_AMBULATORY_CARE_PROVIDER_SITE_OTHER): Payer: Medicare Other | Admitting: Surgery

## 2022-08-08 ENCOUNTER — Other Ambulatory Visit: Payer: Self-pay

## 2022-08-08 DIAGNOSIS — M25562 Pain in left knee: Secondary | ICD-10-CM

## 2022-08-08 DIAGNOSIS — D649 Anemia, unspecified: Secondary | ICD-10-CM

## 2022-08-08 NOTE — Progress Notes (Signed)
Office Visit Note   Patient: Erica Beasley           Date of Birth: 1956/03/06           MRN: [id number] Visit Date: 08/08/2022              Requested by: Claretta Fraise, MD [place],  [place] [address] PCP: Claretta Fraise, MD   Assessment & Plan: Visit Diagnoses:  1. Acute pain of left knee   2. 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  Blood work also drawn today to check a CBC, arthritis panel including HLA-B27.  Advised patient to be in contact with her neurologist to get their input on patient's recurrent knee swelling.  Not sure if there needs to be a medication change for her rheumatoid arthritis.  Follow-up with Dr. Lorin Mercy in 2 weeks to review the results.  Follow-Up Instructions: Return in about 2 weeks (around 08/22/2022) for With Dr. Lorin Mercy to review MRI left knee and lab work.   Orders:  Orders Placed This Encounter  Procedures   MR Knee Left w/o contrast   CBC with Differential   Antinuclear Antib (ANA)   Rheumatoid Factor   Sed Rate (ESR)   C-reactive protein   Uric acid   HLA-B27 antigen   No orders of the defined types were placed in this encounter.     Procedures: No procedures performed   Clinical Data: No additional findings.   Subjective: No chief complaint on file.   HPI 66 year old white female comes in today again with complaints of left knee pain and swelling.  Last seen by Dr. Lorin Mercy July 26, 2022 and left knee aspiration and injection was performed.  Joint fluid sent for analysis and Dr. Lorin Mercy to review that and said that these were unremarkable.  Synovial WBC 52,300.  No crystals.  Culture no growth.  States that her knee did well for couple of days and then pain and swelling returned.  She recently saw rheumatology PA. Review of Systems No current planes of cardiopulmonary  GI/GU issues  Objective: Vital Signs: There were no vitals taken for this visit.  Physical Exam HENT:     Head: Normocephalic and atraumatic.  Eyes:     Extraocular Movements: Extraocular movements intact.  Pulmonary:     Effort: No respiratory distress.  Musculoskeletal:     Comments: Left knee medial joint line tender.  Some pain with McMurray's testing.  Swelling with moderate effusion.  Neurological:     Mental Status: She is alert.  Psychiatric:        Mood and Affect: Mood normal.     Ortho Exam  Specialty Comments:  No specialty comments available.  Imaging: No results found.   PMFS History: Patient Active Problem List   Diagnosis Date Noted   Chronic bilateral low back pain without sciatica 06/29/2022   Chronic right SI joint pain 06/29/2022   Gait difficulty 06/29/2022   IDA (iron deficiency anemia) 06/07/2022   Symptomatic anemia 05/12/2022   Osteoporosis 01/17/2022   Hypothyroidism 11/13/2020   B12 deficiency 11/13/2020   COPD without exacerbation ([place]) 11/13/2020   Vitamin D deficiency 11/13/2020   Neuropathy 11/13/2020   Rheumatoid arthritis involving multiple sites ([place]) 11/13/2020   Mixed hyperlipidemia 11/13/2020   Aortic atherosclerosis ([place]) 11/13/2020   GERD (gastroesophageal  reflux disease) 11/05/2020   Depression, recurrent ([place]) 09/22/2020   Monoclonal gammopathy 04/03/2020   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 03/26/2020   Normocytic anemia 05/03/2019   Chronic diarrhea 01/29/2019   Past Medical History:  Diagnosis Date   Aortic atherosclerosis ([place]) 11/13/2020   CKD (chronic kidney disease) stage 2, GFR 60-89 ml/min    COPD (chronic obstructive pulmonary disease) ([place])    COVID-19 06/2019   Depression    Essential hypertension    GERD (gastroesophageal reflux disease)    H/O Clostridium difficile infection 05/03/2019   History of diabetes mellitus    Hx of colonic polyps 05/03/2019   Hyperlipidemia     Hypothyroidism    Neuropathy    Rheumatoid arthritis ([place])    Stroke (cerebrum) ([place]) 07/09/2022   Stroke ([place]) 07/08/2022   Vitamin B12 deficiency    Vitamin D deficiency     Family History  Problem Relation Age of Onset   Stroke Mother    Heart disease Mother    Diabetes Mother    Breast cancer Mother    Other Father        stomach taken out for some reason   Migraines Daughter    Colon cancer Neg Hx     Past Surgical History:  Procedure Laterality Date   BIOPSY  09/16/2019   Procedure: BIOPSY;  Surgeon: Daneil Dolin, MD;  [facility];  Service: Endoscopy;;   BIOPSY  04/28/2020   Procedure: BIOPSY;  Surgeon: Daneil Dolin, MD;  [facility];  Service: Endoscopy;;   BIOPSY  06/08/2020   Procedure: BIOPSY;  Surgeon: Daneil Dolin, MD;  [facility];  Service: Endoscopy;;   CATARACT EXTRACTION Bilateral    COLONOSCOPY  09/2010   Dr. Posey Pronto: mild diverticulsis in sigmoid colon.    COLONOSCOPY WITH PROPOFOL N/A 09/16/2019   Rourk: Diverticulosis, random colon biopsies negative for microscopic colitis.   COLONOSCOPY WITH PROPOFOL N/A 06/08/2020   Procedure: COLONOSCOPY WITH PROPOFOL;  Surgeon: Daneil Dolin, MD;  [facility];  Service: Endoscopy;  Laterality: N/A;  9:15am   ESOPHAGOGASTRODUODENOSCOPY (EGD) WITH PROPOFOL N/A 04/28/2020   Procedure: ESOPHAGOGASTRODUODENOSCOPY (EGD) WITH PROPOFOL;  Surgeon: Daneil Dolin, MD;  [facility];  Service: Endoscopy;  Laterality: N/A;  3:30   YAG LASER APPLICATION Left 07/62/2633   Procedure: YAG LASER APPLICATION;  Surgeon: Williams Che, MD;  [facility];  Service: Ophthalmology;  Laterality: Left;   Social History   Occupational History   Occupation: DISABLED  Tobacco Use   Smoking status: Former    Packs/day: 1.00    Years: 15.00    Total pack years: 15.00    Types: Cigarettes    Quit date: 09/11/2017    Years since quitting: 4.9   Smokeless tobacco: Former   Scientific laboratory technician Use: Never used  Substance and Sexual Activity   Alcohol use: Never   Drug use: Never   Sexual activity: Not Currently

## 2022-08-09 ENCOUNTER — Ambulatory Visit
Admission: RE | Admit: 2022-08-09 | Discharge: 2022-08-09 | Disposition: A | Discharge status: Home / Self Care | Payer: Medicare Other | Source: Ambulatory Visit | Attending: Surgery | Admitting: Surgery

## 2022-08-09 DIAGNOSIS — S83232A Complex tear of medial meniscus, current injury, left knee, initial encounter: Secondary | ICD-10-CM | POA: Diagnosis not present

## 2022-08-09 DIAGNOSIS — M25462 Effusion, left knee: Secondary | ICD-10-CM | POA: Diagnosis not present

## 2022-08-09 DIAGNOSIS — M25562 Pain in left knee: Secondary | ICD-10-CM

## 2022-08-09 LAB — CBC WITH DIFFERENTIAL/PLATELET
Absolute Monocytes: 602 cells/uL (ref 200–950)
Basophils Absolute: 13 cells/uL (ref 0–200)
Basophils Relative: 0.2 %
Eosinophils Absolute: 83 cells/uL (ref 15–500)
Eosinophils Relative: 1.3 %
HCT: 26.5 % — ABNORMAL LOW (ref 35.0–45.0)
Hemoglobin: 8.3 g/dL — ABNORMAL LOW (ref 11.7–15.5)
Lymphs Abs: 1370 cells/uL (ref 850–3900)
MCH: 27.4 pg (ref 27.0–33.0)
MCHC: 31.3 g/dL — ABNORMAL LOW (ref 32.0–36.0)
MCV: 87.5 fL (ref 80.0–100.0)
MPV: 8.8 fL (ref 7.5–12.5)
Monocytes Relative: 9.4 %
Neutro Abs: 4333 cells/uL (ref 1500–7800)
Neutrophils Relative %: 67.7 %
Platelets: 376 10*3/uL (ref 140–400)
RBC: 3.03 10*6/uL — ABNORMAL LOW (ref 3.80–5.10)
RDW: 13.5 % (ref 11.0–15.0)
Total Lymphocyte: 21.4 %
WBC: 6.4 10*3/uL (ref 3.8–10.8)

## 2022-08-09 LAB — C-REACTIVE PROTEIN: CRP: 271 mg/L — ABNORMAL HIGH (ref <8.0)

## 2022-08-09 LAB — ANA: Anti Nuclear Antibody (ANA): NEGATIVE

## 2022-08-09 LAB — HLA-B27 ANTIGEN: HLA-B27 Antigen: NEGATIVE

## 2022-08-09 LAB — SEDIMENTATION RATE: Sed Rate: 124 mm/h — ABNORMAL HIGH (ref 0–30)

## 2022-08-09 LAB — RHEUMATOID FACTOR: Rheumatoid fact SerPl-aCnc: 181 IU/mL — ABNORMAL HIGH (ref <14)

## 2022-08-09 LAB — URIC ACID: Uric Acid, Serum: 1.5 mg/dL — ABNORMAL LOW (ref 2.5–7.0)

## 2022-08-10 DIAGNOSIS — L409 Psoriasis, unspecified: Secondary | ICD-10-CM | POA: Diagnosis not present

## 2022-08-10 DIAGNOSIS — M25561 Pain in right knee: Secondary | ICD-10-CM | POA: Diagnosis not present

## 2022-08-10 DIAGNOSIS — M2550 Pain in unspecified joint: Secondary | ICD-10-CM | POA: Diagnosis not present

## 2022-08-10 DIAGNOSIS — M1991 Primary osteoarthritis, unspecified site: Secondary | ICD-10-CM | POA: Diagnosis not present

## 2022-08-10 DIAGNOSIS — M25562 Pain in left knee: Secondary | ICD-10-CM | POA: Diagnosis not present

## 2022-08-10 DIAGNOSIS — R5383 Other fatigue: Secondary | ICD-10-CM | POA: Diagnosis not present

## 2022-08-10 DIAGNOSIS — L4059 Other psoriatic arthropathy: Secondary | ICD-10-CM | POA: Diagnosis not present

## 2022-08-10 DIAGNOSIS — Z79899 Other long term (current) drug therapy: Secondary | ICD-10-CM | POA: Diagnosis not present

## 2022-08-10 DIAGNOSIS — M5136 Other intervertebral disc degeneration, lumbar region: Secondary | ICD-10-CM | POA: Diagnosis not present

## 2022-08-10 DIAGNOSIS — Z681 Body mass index (BMI) 19 or less, adult: Secondary | ICD-10-CM | POA: Diagnosis not present

## 2022-08-10 DIAGNOSIS — M0579 Rheumatoid arthritis with rheumatoid factor of multiple sites without organ or systems involvement: Secondary | ICD-10-CM | POA: Diagnosis not present

## 2022-08-10 DIAGNOSIS — D649 Anemia, unspecified: Secondary | ICD-10-CM | POA: Diagnosis not present

## 2022-08-11 ENCOUNTER — Encounter: Payer: Self-pay | Admitting: *Deleted

## 2022-08-11 ENCOUNTER — Other Ambulatory Visit: Payer: Self-pay | Admitting: *Deleted

## 2022-08-11 ENCOUNTER — Telehealth: Payer: Self-pay | Admitting: Physician Assistant

## 2022-08-11 ENCOUNTER — Other Ambulatory Visit: Payer: Self-pay

## 2022-08-11 DIAGNOSIS — R803 Bence Jones proteinuria: Secondary | ICD-10-CM

## 2022-08-11 DIAGNOSIS — D649 Anemia, unspecified: Secondary | ICD-10-CM

## 2022-08-11 NOTE — Telephone Encounter (Signed)
TELEPHONE SUMMARY: I spoke with patient's rheumatology provider Leafy Kindle, PA-C of [facility] rheumatology) on 08/10/2022 to discuss the worsening anemia of [place].  Her rheumatology team has decided to stop her sulfasalazine, and will check repeat CBC in 3 weeks and notify us of the results.  If no improvement, they may consider stopping leflunomide next.  They report that they will be starting the patient on Cosentyx instead to see if that will provide some relief for her rheumatoid arthritis (and possible comorbid psoriatic arthritis).  ASSESSMENT & DISCUSSION: Labs obtained by PCP today show decreasing Hgb at 7.9. Etiology of patient's anemia remains uncertain.   Myelosuppression from rheumatology medications above is a significant possibility.   Patient has been NEGATIVE for Hemoccult stool on multiple occasions within the past year.  No obvious rectal bleeding or melena.  She is scheduled for capsule study tomorrow (08/12/2022) Bone marrow biopsy was nondiagnostic.  Hemolysis labs and nutritional deficiency work-up have been unremarkable, apart from significant functional iron deficiency (no improvement in hemoglobin after IV iron) No evidence of chronic kidney disease based on patient's creatinine.  PLAN: Due to acutely worsening anemia, we will recheck hemolysis labs (total bilirubin, direct bilirubin, LDH, haptoglobin, reticulocytes).  We will also check Cystatin-C to rule out any occult kidney disease. Repeat CBC/D on Monday, 08/15/2022 along with BB sample and possible blood transfusion. Weekly CBC/D with BB sample and possible transfusion until blood counts have stabilized.  Harriett Rush, PA-C 08/11/22 4:51 PM

## 2022-08-11 NOTE — Progress Notes (Signed)
Received lab results from [place] and HGB 7.9/26.0, which has steadily dropped over the past few days.  Per Tarri Abernethy PA-C we will check CBC/BB sample weekly and fax results to Rheumatologist.  Patient and daughter aware and lab appointments made.

## 2022-08-12 ENCOUNTER — Ambulatory Visit (INDEPENDENT_AMBULATORY_CARE_PROVIDER_SITE_OTHER): Payer: Medicare Other | Admitting: Orthopaedic Surgery

## 2022-08-12 ENCOUNTER — Ambulatory Visit (HOSPITAL_COMMUNITY)
Admission: RE | Admit: 2022-08-12 | Discharge: 2022-08-12 | Disposition: A | Discharge status: Home / Self Care | Payer: Medicare Other | Attending: Internal Medicine | Admitting: Internal Medicine

## 2022-08-12 ENCOUNTER — Ambulatory Visit: Payer: Medicare Other | Admitting: Family Medicine

## 2022-08-12 ENCOUNTER — Encounter (HOSPITAL_COMMUNITY): Payer: Self-pay | Admitting: Internal Medicine

## 2022-08-12 ENCOUNTER — Encounter: Payer: Self-pay | Admitting: Orthopaedic Surgery

## 2022-08-12 ENCOUNTER — Encounter (HOSPITAL_COMMUNITY)
Admission: RE | Disposition: A | Discharge status: Home / Self Care | Payer: Self-pay | Source: Home / Self Care | Attending: Internal Medicine

## 2022-08-12 VITALS — BP 122/68 | HR 74 | Ht 64.0 in | Wt 107.0 lb

## 2022-08-12 DIAGNOSIS — Z79899 Other long term (current) drug therapy: Secondary | ICD-10-CM | POA: Diagnosis not present

## 2022-08-12 DIAGNOSIS — K269 Duodenal ulcer, unspecified as acute or chronic, without hemorrhage or perforation: Secondary | ICD-10-CM | POA: Diagnosis not present

## 2022-08-12 DIAGNOSIS — N189 Chronic kidney disease, unspecified: Secondary | ICD-10-CM | POA: Diagnosis not present

## 2022-08-12 DIAGNOSIS — Z8673 Personal history of transient ischemic attack (TIA), and cerebral infarction without residual deficits: Secondary | ICD-10-CM | POA: Diagnosis not present

## 2022-08-12 DIAGNOSIS — D649 Anemia, unspecified: Secondary | ICD-10-CM | POA: Diagnosis not present

## 2022-08-12 DIAGNOSIS — M0579 Rheumatoid arthritis with rheumatoid factor of multiple sites without organ or systems involvement: Secondary | ICD-10-CM | POA: Diagnosis not present

## 2022-08-12 DIAGNOSIS — J449 Chronic obstructive pulmonary disease, unspecified: Secondary | ICD-10-CM | POA: Insufficient documentation

## 2022-08-12 DIAGNOSIS — D509 Iron deficiency anemia, unspecified: Secondary | ICD-10-CM | POA: Diagnosis not present

## 2022-08-12 DIAGNOSIS — K263 Acute duodenal ulcer without hemorrhage or perforation: Secondary | ICD-10-CM | POA: Diagnosis not present

## 2022-08-12 DIAGNOSIS — M659 Synovitis and tenosynovitis, unspecified: Secondary | ICD-10-CM | POA: Diagnosis not present

## 2022-08-12 DIAGNOSIS — M65962 Unspecified synovitis and tenosynovitis, left lower leg: Secondary | ICD-10-CM | POA: Insufficient documentation

## 2022-08-12 DIAGNOSIS — I129 Hypertensive chronic kidney disease with stage 1 through stage 4 chronic kidney disease, or unspecified chronic kidney disease: Secondary | ICD-10-CM | POA: Insufficient documentation

## 2022-08-12 DIAGNOSIS — E1122 Type 2 diabetes mellitus with diabetic chronic kidney disease: Secondary | ICD-10-CM | POA: Insufficient documentation

## 2022-08-12 HISTORY — PX: GIVENS CAPSULE STUDY: SHX5432

## 2022-08-12 SURGERY — IMAGING PROCEDURE, GI TRACT, INTRALUMINAL, VIA CAPSULE

## 2022-08-12 NOTE — Progress Notes (Signed)
Office Visit Note   Patient: Erica Beasley           Date of Birth: Feb 05, 1956           MRN: [id number] Visit Date: 08/12/2022              Requested by: Claretta Fraise, MD [place],  [place] [id number] PCP: Claretta Fraise, MD   Assessment & Plan: Visit Diagnoses:  1. Rheumatoid arthritis involving multiple sites with positive rheumatoid factor ([place])   2. Synovitis of left knee     Plan: Patient can return in 1 month if still having problems we will consider repeat aspiration and injection of the knee.  Currently she is on 30 mg of prednisone daily and hopefully once she starts biologic medication for her arthritis she will get significant improvement.  Recheck 1 month.  Follow-Up Instructions: No follow-ups on file.   Orders:  No orders of the defined types were placed in this encounter.  No orders of the defined types were placed in this encounter.     Procedures: No procedures performed   Clinical Data: No additional findings.   Subjective: Chief Complaint  Patient presents with   Left Knee - Follow-up, Pain    MRI review    HPI 66 year old female returns with ongoing problems with significant synovitis of her left knee.  She has been placed on prednisone and is waiting on approval from insurance company for rheumatologist to start her on a biologic medication for ongoing symptoms.  Patient states that she was told this is primarily psoriatic problems with synovitis and thickening.  MRI scan does show very large joint effusion with thickening of the capsule and synovium.  Complex tear of the posterior meniscus and by meniscus and posterior horn of the lateral meniscus.  Some subchondral marrow edema along the peripheral edge of the lateral compartment.  Patient had a last intra-articular injection on 07/26/2022 by me.  Knee MRI results are listed below.  Review of Systems 14 point system update unchanged from 07/26/2022 other than as mentioned  above.   Objective: Vital Signs: BP 122/68   Pulse 74   Ht '5\' 4"'$  (1.626 m)   Wt 107 lb (48.5 kg)   BMI 18.37 kg/m   Physical Exam Constitutional:      Appearance: She is well-developed.  HENT:     Head: Normocephalic.     Right Ear: External ear normal.     Left Ear: External ear normal. There is no impacted cerumen.  Eyes:     Pupils: Pupils are equal, round, and reactive to light.  Neck:     Thyroid: No thyromegaly.     Trachea: No tracheal deviation.  Cardiovascular:     Rate and Rhythm: Normal rate.  Pulmonary:     Effort: Pulmonary effort is normal.  Abdominal:     Palpations: Abdomen is soft.  Musculoskeletal:     Cervical back: No rigidity.  Skin:    General: Skin is warm and dry.  Neurological:     Mental Status: She is alert and oriented to person, place, and time.  Psychiatric:        Behavior: Behavior normal.     Ortho Exam patient has slight increased warmth during knee 4+ knee effusion.  She is ambulates with a cane.  Medial lateral joint line tenderness.  Knee reaches full extension quad strength is good.  Specialty Comments:  No specialty comments available.  Imaging:  Show images for MR Knee Left w/o contrast Study Result  Narrative & Impression  CLINICAL DATA:  Knee pain, chronic, inflammatory arthritis suspected, xray done worsening left knee pain and swelling. hx RA. must r/o meniscal tear. failed conservative treatment.   EXAM: MRI OF THE LEFT KNEE WITHOUT CONTRAST   TECHNIQUE: Multiplanar, multisequence MR imaging of the knee was performed. No intravenous contrast was administered.   COMPARISON:  Knee radiograph 07/26/2022.   FINDINGS: There is poor fat saturation.   MENISCI   Medial: There is a complex tear of the posterior horn and body of the medial meniscus with substance loss.   Lateral: Free edge tear of the posterior horn. Free edge blunting and fraying of the lateral meniscal body.   LIGAMENTS   Cruciates: ACL  and PCL are intact.   Collaterals: Medial collateral ligament is intact. Lateral collateral ligament complex is intact.   CARTILAGE   Patellofemoral: Intermediate grade cartilage loss of the patellar facets.   Medial:  Low-grade cartilage loss.   Lateral: Focal high-grade cartilage loss of the weight-bearing surfaces.   JOINT: Large joint effusion with synovitis. There is synovial and plical thickening.   POPLITEAL FOSSA: Miniscule Baker's cyst.   EXTENSOR MECHANISM: Intact quadriceps tendon. Intact patellar tendon.   BONES: Subchondral marrow edema along the periphery of the lateral compartment. No evidence of acute fracture. No aggressive osseous lesion. No marginal erosion.   Other: Soft tissue swelling along the knee. No focal fluid collection.   IMPRESSION: Large joint effusion with synovitis and tricompartment cartilage loss compatible with history of inflammatory arthritis   Complex tear of the posterior horn and body of the medial meniscus with substance loss.   Free edge tear of the posterior horn lateral meniscus with free edge blunting and fraying of the lateral meniscal body.   Intact cruciate and collateral ligaments.     Electronically Signed   By: Maurine Simmering M.D.   On: 08/09/2022 15:26       PMFS History: Patient Active Problem List   Diagnosis Date Noted   Synovitis of left knee 08/12/2022   Chronic bilateral low back pain without sciatica 06/29/2022   Chronic right SI joint pain 06/29/2022   Gait difficulty 06/29/2022   IDA (iron deficiency anemia) 06/07/2022   Symptomatic anemia 05/12/2022   Osteoporosis 01/17/2022   Hypothyroidism 11/13/2020   B12 deficiency 11/13/2020   COPD without exacerbation ([place]) 11/13/2020   Vitamin D deficiency 11/13/2020   Neuropathy 11/13/2020   Rheumatoid arthritis involving multiple sites ([place]) 11/13/2020   Mixed hyperlipidemia 11/13/2020   Aortic atherosclerosis ([place]) 11/13/2020   GERD  (gastroesophageal reflux disease) 11/05/2020   Depression, recurrent ([place]) 09/22/2020   Monoclonal gammopathy 04/03/2020   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 03/26/2020   Normocytic anemia 05/03/2019   Chronic diarrhea 01/29/2019   Past Medical History:  Diagnosis Date   Aortic atherosclerosis ([place]) 11/13/2020   CKD (chronic kidney disease) stage 2, GFR 60-89 ml/min    COPD (chronic obstructive pulmonary disease) ([place])    COVID-19 06/2019   Depression    Essential hypertension    GERD (gastroesophageal reflux disease)    H/O Clostridium difficile infection 05/03/2019   History of diabetes mellitus    Hx of colonic polyps 05/03/2019   Hyperlipidemia    Hypothyroidism    Neuropathy    Rheumatoid arthritis ([place])    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   Stroke ([place]) 07/08/2022   Vitamin B12 deficiency    Vitamin D deficiency  Family History  Problem Relation Age of Onset   Stroke Mother    Heart disease Mother    Diabetes Mother    Breast cancer Mother    Other Father        stomach taken out for some reason   Migraines Daughter    Colon cancer Neg Hx     Past Surgical History:  Procedure Laterality Date   BIOPSY  09/16/2019   Procedure: BIOPSY;  Surgeon: Daneil Dolin, MD;  [facility];  Service: Endoscopy;;   BIOPSY  04/28/2020   Procedure: BIOPSY;  Surgeon: Daneil Dolin, MD;  [facility];  Service: Endoscopy;;   BIOPSY  06/08/2020   Procedure: BIOPSY;  Surgeon: Daneil Dolin, MD;  [facility];  Service: Endoscopy;;   CATARACT EXTRACTION Bilateral    COLONOSCOPY  09/2010   Dr. Posey Pronto: mild diverticulsis in sigmoid colon.    COLONOSCOPY WITH PROPOFOL N/A 09/16/2019   Rourk: Diverticulosis, random colon biopsies negative for microscopic colitis.   COLONOSCOPY WITH PROPOFOL N/A 06/08/2020   Procedure: COLONOSCOPY WITH PROPOFOL;  Surgeon: Daneil Dolin, MD;  [facility];  Service: Endoscopy;   Laterality: N/A;  9:15am   ESOPHAGOGASTRODUODENOSCOPY (EGD) WITH PROPOFOL N/A 04/28/2020   Procedure: ESOPHAGOGASTRODUODENOSCOPY (EGD) WITH PROPOFOL;  Surgeon: Daneil Dolin, MD;  [facility];  Service: Endoscopy;  Laterality: N/A;  5:27   YAG LASER APPLICATION Left 78/24/2353   Procedure: YAG LASER APPLICATION;  Surgeon: Williams Che, MD;  Location: [facility];  Service: Ophthalmology;  Laterality: Left;   Social History   Occupational History   Occupation: DISABLED  Tobacco Use   Smoking status: Former    Packs/day: 1.00    Years: 15.00    Total pack years: 15.00    Types: Cigarettes    Quit date: 09/11/2017    Years since quitting: 4.9   Smokeless tobacco: Former  Scientific laboratory technician Use: Never used  Substance and Sexual Activity   Alcohol use: Never   Drug use: Never   Sexual activity: Not Currently

## 2022-08-15 ENCOUNTER — Inpatient Hospital Stay (HOSPITAL_BASED_OUTPATIENT_CLINIC_OR_DEPARTMENT_OTHER): Payer: Medicare Other | Admitting: Hematology

## 2022-08-15 ENCOUNTER — Other Ambulatory Visit: Payer: Self-pay | Admitting: Physician Assistant

## 2022-08-15 DIAGNOSIS — R5382 Chronic fatigue, unspecified: Secondary | ICD-10-CM | POA: Diagnosis not present

## 2022-08-15 DIAGNOSIS — M069 Rheumatoid arthritis, unspecified: Secondary | ICD-10-CM | POA: Diagnosis not present

## 2022-08-15 DIAGNOSIS — Z79899 Other long term (current) drug therapy: Secondary | ICD-10-CM | POA: Diagnosis not present

## 2022-08-15 DIAGNOSIS — Z87891 Personal history of nicotine dependence: Secondary | ICD-10-CM | POA: Diagnosis not present

## 2022-08-15 DIAGNOSIS — D649 Anemia, unspecified: Secondary | ICD-10-CM

## 2022-08-15 DIAGNOSIS — R803 Bence Jones proteinuria: Secondary | ICD-10-CM

## 2022-08-15 DIAGNOSIS — E876 Hypokalemia: Secondary | ICD-10-CM | POA: Diagnosis not present

## 2022-08-15 DIAGNOSIS — I69354 Hemiplegia and hemiparesis following cerebral infarction affecting left non-dominant side: Secondary | ICD-10-CM | POA: Diagnosis not present

## 2022-08-15 DIAGNOSIS — F32A Depression, unspecified: Secondary | ICD-10-CM | POA: Diagnosis not present

## 2022-08-15 DIAGNOSIS — Z803 Family history of malignant neoplasm of breast: Secondary | ICD-10-CM | POA: Diagnosis not present

## 2022-08-15 DIAGNOSIS — M810 Age-related osteoporosis without current pathological fracture: Secondary | ICD-10-CM | POA: Diagnosis not present

## 2022-08-15 DIAGNOSIS — Z7982 Long term (current) use of aspirin: Secondary | ICD-10-CM | POA: Diagnosis not present

## 2022-08-15 LAB — RETICULOCYTES
Immature Retic Fract: 14.6 % (ref 2.3–15.9)
RBC.: 2.82 MIL/uL — ABNORMAL LOW (ref 3.87–5.11)
Retic Count, Absolute: 32.1 10*3/uL (ref 19.0–186.0)
Retic Ct Pct: 1.1 % (ref 0.4–3.1)

## 2022-08-15 LAB — CBC WITH DIFFERENTIAL/PLATELET
Abs Immature Granulocytes: 0.04 10*3/uL (ref 0.00–0.07)
Basophils Absolute: 0 10*3/uL (ref 0.0–0.1)
Basophils Relative: 0 %
Eosinophils Absolute: 0 10*3/uL (ref 0.0–0.5)
Eosinophils Relative: 0 %
HCT: 26.9 % — ABNORMAL LOW (ref 36.0–46.0)
Hemoglobin: 7.6 g/dL — ABNORMAL LOW (ref 12.0–15.0)
Immature Granulocytes: 1 %
Lymphocytes Relative: 33 %
Lymphs Abs: 1.6 10*3/uL (ref 0.7–4.0)
MCH: 26.6 pg (ref 26.0–34.0)
MCHC: 28.3 g/dL — ABNORMAL LOW (ref 30.0–36.0)
MCV: 94.1 fL (ref 80.0–100.0)
Monocytes Absolute: 0.4 10*3/uL (ref 0.1–1.0)
Monocytes Relative: 8 %
Neutro Abs: 2.9 10*3/uL (ref 1.7–7.7)
Neutrophils Relative %: 58 %
Platelets: 346 10*3/uL (ref 150–400)
RBC: 2.86 MIL/uL — ABNORMAL LOW (ref 3.87–5.11)
RDW: 15.3 % (ref 11.5–15.5)
WBC: 4.9 10*3/uL (ref 4.0–10.5)
nRBC: 1.2 % — ABNORMAL HIGH (ref 0.0–0.2)

## 2022-08-15 LAB — SAMPLE TO BLOOD BANK

## 2022-08-15 LAB — BILIRUBIN, TOTAL: Total Bilirubin: 0.3 mg/dL (ref 0.3–1.2)

## 2022-08-15 LAB — LACTATE DEHYDROGENASE: LDH: 134 U/L (ref 98–192)

## 2022-08-15 LAB — PREPARE RBC (CROSSMATCH)

## 2022-08-15 LAB — BILIRUBIN, DIRECT: Bilirubin, Direct: 0.1 mg/dL (ref 0.0–0.2)

## 2022-08-16 ENCOUNTER — Inpatient Hospital Stay: Payer: Medicare Other

## 2022-08-16 DIAGNOSIS — F32A Depression, unspecified: Secondary | ICD-10-CM | POA: Diagnosis not present

## 2022-08-16 DIAGNOSIS — Z803 Family history of malignant neoplasm of breast: Secondary | ICD-10-CM | POA: Diagnosis not present

## 2022-08-16 DIAGNOSIS — D649 Anemia, unspecified: Secondary | ICD-10-CM | POA: Diagnosis not present

## 2022-08-16 DIAGNOSIS — E876 Hypokalemia: Secondary | ICD-10-CM | POA: Diagnosis not present

## 2022-08-16 DIAGNOSIS — I69354 Hemiplegia and hemiparesis following cerebral infarction affecting left non-dominant side: Secondary | ICD-10-CM | POA: Diagnosis not present

## 2022-08-16 DIAGNOSIS — Z7982 Long term (current) use of aspirin: Secondary | ICD-10-CM | POA: Diagnosis not present

## 2022-08-16 DIAGNOSIS — M810 Age-related osteoporosis without current pathological fracture: Secondary | ICD-10-CM | POA: Diagnosis not present

## 2022-08-16 DIAGNOSIS — Z79899 Other long term (current) drug therapy: Secondary | ICD-10-CM | POA: Diagnosis not present

## 2022-08-16 DIAGNOSIS — M069 Rheumatoid arthritis, unspecified: Secondary | ICD-10-CM | POA: Diagnosis not present

## 2022-08-16 DIAGNOSIS — R5382 Chronic fatigue, unspecified: Secondary | ICD-10-CM | POA: Diagnosis not present

## 2022-08-16 DIAGNOSIS — Z87891 Personal history of nicotine dependence: Secondary | ICD-10-CM | POA: Diagnosis not present

## 2022-08-16 LAB — MISC LABCORP TEST (SEND OUT): Labcorp test code: 121265

## 2022-08-16 MED ORDER — DIPHENHYDRAMINE HCL 25 MG PO CAPS
25.0000 mg | ORAL_CAPSULE | Freq: Once | ORAL | Status: AC
  Administered 2022-08-16: 25 mg via ORAL
  Filled 2022-08-16: qty 1

## 2022-08-16 MED ORDER — SODIUM CHLORIDE 0.9% IV SOLUTION
250.0000 mL | Freq: Once | INTRAVENOUS | Status: AC
  Administered 2022-08-16: 250 mL via INTRAVENOUS

## 2022-08-16 MED ORDER — ACETAMINOPHEN 325 MG PO TABS
650.0000 mg | ORAL_TABLET | Freq: Once | ORAL | Status: AC
  Administered 2022-08-16: 650 mg via ORAL
  Filled 2022-08-16: qty 2

## 2022-08-16 NOTE — Patient Instructions (Signed)
[facility]  Discharge Instructions: Thank you for choosing [facility] to provide your oncology and hematology care.  If you have a lab appointment with the Cancer Center, please come in thru the Main Entrance and check in at the main information desk.  Wear comfortable clothing and clothing appropriate for easy access to any Portacath or PICC line.   We strive to give you quality time with your provider. You may need to reschedule your appointment if you arrive late (15 or more minutes).  Arriving late affects you and other patients whose appointments are after yours.  Also, if you miss three or more appointments without notifying the office, you may be dismissed from the clinic at the provider's discretion.      For prescription refill requests, have your pharmacy contact our office and allow 72 hours for refills to be completed.    Today you received the following chemotherapy and/or immunotherapy agents 1UPRBC      To help prevent nausea and vomiting after your treatment, we encourage you to take your nausea medication as directed.  BELOW ARE SYMPTOMS THAT SHOULD BE REPORTED IMMEDIATELY: *FEVER GREATER THAN 100.4 F (38 C) OR HIGHER *CHILLS OR SWEATING *NAUSEA AND VOMITING THAT IS NOT CONTROLLED WITH YOUR NAUSEA MEDICATION *UNUSUAL SHORTNESS OF BREATH *UNUSUAL BRUISING OR BLEEDING *URINARY PROBLEMS (pain or burning when urinating, or frequent urination) *BOWEL PROBLEMS (unusual diarrhea, constipation, pain near the anus) TENDERNESS IN MOUTH AND THROAT WITH OR WITHOUT PRESENCE OF ULCERS (sore throat, sores in mouth, or a toothache) UNUSUAL RASH, SWELLING OR PAIN  UNUSUAL VAGINAL DISCHARGE OR ITCHING   Items with * indicate a potential emergency and should be followed up as soon as possible or go to the Emergency Department if any problems should occur.  Please show the CHEMOTHERAPY ALERT CARD or IMMUNOTHERAPY ALERT CARD at check-in to the Emergency  Department and triage nurse.  Should you have questions after your visit or need to cancel or reschedule your appointment, please contact [facility] [phone]  and follow the prompts.  Office hours are 8:00 a.m. to 4:30 p.m. Monday - Friday. Please note that voicemails left after 4:00 p.m. may not be returned until the following business day.  We are closed weekends and major holidays. You have access to a nurse at all times for urgent questions. Please call the main number to the clinic [phone] and follow the prompts.  For any non-urgent questions, you may also contact your provider using MyChart. We now offer e-Visits for anyone 18 and older to request care online for non-urgent symptoms. For details visit [email or web address].   Also download the MyChart app! Go to the app store, search "MyChart", open the app, select [facility], and log in with your MyChart username and password.  Masks are optional in the cancer centers. If you would like for your care team to wear a mask while they are taking care of you, please let them know. You may have one support person who is at least 65 years old accompany you for your appointments.  

## 2022-08-16 NOTE — Progress Notes (Signed)
Patient presents today for 1UPRBC per providers order.  Vital signs WNL.  Patient has no new complaints at this time.  Peripheral IV started and blood return noted pre and post infusion.  Stable during transfusion without adverse affects.  Vital signs stable.  No complaints at this time.  Discharge from clinic ambulatory in stable condition.  Alert and oriented X 3.  Follow up with [facility] as scheduled.  

## 2022-08-17 ENCOUNTER — Encounter (HOSPITAL_COMMUNITY): Payer: Self-pay | Admitting: Internal Medicine

## 2022-08-17 LAB — BPAM RBC
Blood Product Expiration Date: 202312232359
ISSUE DATE / TIME: 202311210858
Unit Type and Rh: 5100

## 2022-08-17 LAB — TYPE AND SCREEN
ABO/RH(D): O POS
Antibody Screen: NEGATIVE
Unit division: 0

## 2022-08-17 LAB — HAPTOGLOBIN: Haptoglobin: 459 mg/dL — ABNORMAL HIGH (ref 37–355)

## 2022-08-22 ENCOUNTER — Inpatient Hospital Stay: Payer: Medicare Other

## 2022-08-22 DIAGNOSIS — Z79899 Other long term (current) drug therapy: Secondary | ICD-10-CM | POA: Diagnosis not present

## 2022-08-22 DIAGNOSIS — D649 Anemia, unspecified: Secondary | ICD-10-CM

## 2022-08-22 DIAGNOSIS — F32A Depression, unspecified: Secondary | ICD-10-CM | POA: Diagnosis not present

## 2022-08-22 DIAGNOSIS — Z803 Family history of malignant neoplasm of breast: Secondary | ICD-10-CM | POA: Diagnosis not present

## 2022-08-22 DIAGNOSIS — Z87891 Personal history of nicotine dependence: Secondary | ICD-10-CM | POA: Diagnosis not present

## 2022-08-22 DIAGNOSIS — E876 Hypokalemia: Secondary | ICD-10-CM | POA: Diagnosis not present

## 2022-08-22 DIAGNOSIS — I69354 Hemiplegia and hemiparesis following cerebral infarction affecting left non-dominant side: Secondary | ICD-10-CM | POA: Diagnosis not present

## 2022-08-22 DIAGNOSIS — M810 Age-related osteoporosis without current pathological fracture: Secondary | ICD-10-CM | POA: Diagnosis not present

## 2022-08-22 DIAGNOSIS — M069 Rheumatoid arthritis, unspecified: Secondary | ICD-10-CM | POA: Diagnosis not present

## 2022-08-22 DIAGNOSIS — Z7982 Long term (current) use of aspirin: Secondary | ICD-10-CM | POA: Diagnosis not present

## 2022-08-22 DIAGNOSIS — R5382 Chronic fatigue, unspecified: Secondary | ICD-10-CM | POA: Diagnosis not present

## 2022-08-22 LAB — CBC
HCT: 35.4 % — ABNORMAL LOW (ref 36.0–46.0)
Hemoglobin: 10.5 g/dL — ABNORMAL LOW (ref 12.0–15.0)
MCH: 26.7 pg (ref 26.0–34.0)
MCHC: 29.7 g/dL — ABNORMAL LOW (ref 30.0–36.0)
MCV: 90.1 fL (ref 80.0–100.0)
Platelets: 257 10*3/uL (ref 150–400)
RBC: 3.93 MIL/uL (ref 3.87–5.11)
RDW: 17.3 % — ABNORMAL HIGH (ref 11.5–15.5)
WBC: 9.2 10*3/uL (ref 4.0–10.5)
nRBC: 0 % (ref 0.0–0.2)

## 2022-08-22 LAB — SAMPLE TO BLOOD BANK

## 2022-08-24 ENCOUNTER — Encounter: Payer: Medicare Other | Admitting: Physical Medicine and Rehabilitation

## 2022-08-24 DIAGNOSIS — E1140 Type 2 diabetes mellitus with diabetic neuropathy, unspecified: Secondary | ICD-10-CM | POA: Diagnosis not present

## 2022-08-24 DIAGNOSIS — J449 Chronic obstructive pulmonary disease, unspecified: Secondary | ICD-10-CM | POA: Diagnosis not present

## 2022-08-24 DIAGNOSIS — Z7969 Long term (current) use of other immunomodulators and immunosuppressants: Secondary | ICD-10-CM | POA: Diagnosis not present

## 2022-08-24 DIAGNOSIS — E785 Hyperlipidemia, unspecified: Secondary | ICD-10-CM | POA: Diagnosis not present

## 2022-08-24 DIAGNOSIS — F32A Depression, unspecified: Secondary | ICD-10-CM | POA: Diagnosis not present

## 2022-08-24 DIAGNOSIS — Z7902 Long term (current) use of antithrombotics/antiplatelets: Secondary | ICD-10-CM | POA: Diagnosis not present

## 2022-08-24 DIAGNOSIS — K219 Gastro-esophageal reflux disease without esophagitis: Secondary | ICD-10-CM | POA: Diagnosis not present

## 2022-08-24 DIAGNOSIS — N182 Chronic kidney disease, stage 2 (mild): Secondary | ICD-10-CM | POA: Diagnosis not present

## 2022-08-24 DIAGNOSIS — E559 Vitamin D deficiency, unspecified: Secondary | ICD-10-CM | POA: Diagnosis not present

## 2022-08-24 DIAGNOSIS — I69354 Hemiplegia and hemiparesis following cerebral infarction affecting left non-dominant side: Secondary | ICD-10-CM | POA: Diagnosis not present

## 2022-08-24 DIAGNOSIS — I69328 Other speech and language deficits following cerebral infarction: Secondary | ICD-10-CM | POA: Diagnosis not present

## 2022-08-24 DIAGNOSIS — M545 Low back pain, unspecified: Secondary | ICD-10-CM | POA: Diagnosis not present

## 2022-08-24 DIAGNOSIS — E1122 Type 2 diabetes mellitus with diabetic chronic kidney disease: Secondary | ICD-10-CM | POA: Diagnosis not present

## 2022-08-24 DIAGNOSIS — E538 Deficiency of other specified B group vitamins: Secondary | ICD-10-CM | POA: Diagnosis not present

## 2022-08-24 DIAGNOSIS — G8929 Other chronic pain: Secondary | ICD-10-CM | POA: Diagnosis not present

## 2022-08-24 DIAGNOSIS — I700 Atherosclerosis of aorta: Secondary | ICD-10-CM | POA: Diagnosis not present

## 2022-08-24 DIAGNOSIS — E039 Hypothyroidism, unspecified: Secondary | ICD-10-CM | POA: Diagnosis not present

## 2022-08-24 DIAGNOSIS — I129 Hypertensive chronic kidney disease with stage 1 through stage 4 chronic kidney disease, or unspecified chronic kidney disease: Secondary | ICD-10-CM | POA: Diagnosis not present

## 2022-08-24 DIAGNOSIS — Z7982 Long term (current) use of aspirin: Secondary | ICD-10-CM | POA: Diagnosis not present

## 2022-08-24 DIAGNOSIS — M069 Rheumatoid arthritis, unspecified: Secondary | ICD-10-CM | POA: Diagnosis not present

## 2022-08-25 ENCOUNTER — Ambulatory Visit (INDEPENDENT_AMBULATORY_CARE_PROVIDER_SITE_OTHER): Payer: Medicare Other | Admitting: Family Medicine

## 2022-08-25 ENCOUNTER — Other Ambulatory Visit (HOSPITAL_COMMUNITY): Payer: Self-pay | Admitting: Family Medicine

## 2022-08-25 ENCOUNTER — Encounter: Payer: Self-pay | Admitting: Family Medicine

## 2022-08-25 VITALS — BP 133/79 | HR 67 | Temp 97.1°F | Ht 64.0 in | Wt 103.4 lb

## 2022-08-25 DIAGNOSIS — Z23 Encounter for immunization: Secondary | ICD-10-CM

## 2022-08-25 DIAGNOSIS — Z1231 Encounter for screening mammogram for malignant neoplasm of breast: Secondary | ICD-10-CM

## 2022-08-25 DIAGNOSIS — I6930 Unspecified sequelae of cerebral infarction: Secondary | ICD-10-CM | POA: Diagnosis not present

## 2022-08-25 MED ORDER — LIOTHYRONINE SODIUM 5 MCG PO TABS: 10.0000 ug | ORAL_TABLET | Freq: Every day | ORAL | 2 refills | Status: DC

## 2022-08-25 NOTE — Progress Notes (Signed)
Subjective:  Patient ID: Erica Beasley, female    DOB: Oct 25, 1955  Age: 66 y.o. MRN: [id number]  CC: Follow-up   HPI Erica Beasley presents for recheck of deficits post RMCA CVA. Seen 3 weeks ago by neurologist,  Dr. April Manson.Note reviewed. MRI noted infarct, right posterior, internal capsule and letiform nucleus. Feels therapy helps. Getting some strength back.      08/25/2022   11:14 AM 07/29/2022    1:42 PM 07/12/2022   10:37 AM  Depression screen PHQ 2/9  Decreased Interest 0 0 0  Down, Depressed, Hopeless 0 0 0  PHQ - 2 Score 0 0 0    History Erica Beasley has a past medical history of Aortic atherosclerosis ([place]) (11/13/2020), CKD (chronic kidney disease) stage 2, GFR 60-89 ml/min, COPD (chronic obstructive pulmonary disease) ([place]), COVID-19 (06/2019), Depression, Essential hypertension, GERD (gastroesophageal reflux disease), H/O Clostridium difficile infection (05/03/2019), History of diabetes mellitus, colonic polyps (05/03/2019), Hyperlipidemia, Hypothyroidism, Neuropathy, Rheumatoid arthritis ([place]), Stroke (cerebrum) ([place]) (07/09/2022), Stroke ([place]) (07/08/2022), Vitamin B12 deficiency, and Vitamin D deficiency.   She has a past surgical history that includes Yag laser application (Left, 67/34/1937); Colonoscopy (09/2010); Colonoscopy with propofol (N/A, 09/16/2019); biopsy (09/16/2019); Esophagogastroduodenoscopy (egd) with propofol (N/A, 04/28/2020); biopsy (04/28/2020); Cataract extraction (Bilateral); Colonoscopy with propofol (N/A, 06/08/2020); biopsy (06/08/2020); and Givens capsule study (N/A, 08/12/2022).   Her family history includes Breast cancer in her mother; Diabetes in her mother; Heart disease in her mother; Migraines in her daughter; Other in her father; Stroke in her mother.She reports that she quit smoking about 4 years ago. Her smoking use included cigarettes. She has a 15.00 pack-year smoking history. She has quit using smokeless tobacco. She reports that she does not drink  alcohol and does not use drugs.    ROS Review of Systems  Constitutional: Negative.   HENT: Negative.    Eyes:  Negative for visual disturbance.  Respiratory:  Negative for shortness of breath.   Cardiovascular:  Negative for chest pain.  Gastrointestinal:  Negative for abdominal pain.  Musculoskeletal:  Negative for arthralgias.    Objective:  BP 133/79   Pulse 67   Temp (!) 97.1 F (36.2 C)   Ht '5\' 4"'$  (1.626 m)   Wt 103 lb 6.4 oz (46.9 kg)   SpO2 97%   BMI 17.75 kg/m   BP Readings from Last 3 Encounters:  08/25/22 133/79  08/16/22 (!) 138/93  08/12/22 122/68    Wt Readings from Last 3 Encounters:  08/25/22 103 lb 6.4 oz (46.9 kg)  08/12/22 107 lb (48.5 kg)  08/04/22 107 lb (48.5 kg)     Physical Exam Constitutional:      General: She is not in acute distress.    Appearance: She is well-developed.  Cardiovascular:     Rate and Rhythm: Normal rate and regular rhythm.  Pulmonary:     Breath sounds: Normal breath sounds.  Musculoskeletal:        General: Normal range of motion.  Skin:    General: Skin is warm and dry.  Neurological:     Mental Status: She is alert and oriented to person, place, and time.     Motor: Weakness present.     Coordination: Coordination abnormal.     Gait: Gait abnormal.  Psychiatric:        Mood and Affect: Mood normal.        Behavior: Behavior normal.       Assessment & Plan:   Erica Beasley was seen today  for follow-up.  Diagnoses and all orders for this visit:  Need for shingles vaccine -     Zoster Recombinant (Shingrix )  Late effect of cerebrovascular accident (CVA)  Other orders -     liothyronine (CYTOMEL) 5 MCG tablet; Take 2 tablets (10 mcg total) by mouth daily.       I have discontinued Erica Beasley's sulfaSALAzine, ferrous sulfate, and clopidogrel. I am also having her start on liothyronine. Additionally, I am having her maintain her hydroxychloroquine, albuterol, folic acid, potassium chloride SA,  megestrol, gabapentin, citalopram, traZODone, levothyroxine, calcium carbonate, pantoprazole, diclofenac Sodium, aspirin EC, rosuvastatin, leflunomide, and cyanocobalamin.  Allergies as of 08/25/2022       Reactions   Adalimumab    Other reaction(s): no response Pt is unaware of this Other reaction(s): no response   Methotrexate Other (See Comments)   Other reaction(s): GI side effects   Upadacitinib Other (See Comments)   Other reaction(s): GI side effects        Medication List        Accurate as of August 25, 2022 11:59 PM. If you have any questions, ask your nurse or doctor.          STOP taking these medications    ferrous sulfate 325 (65 FE) MG tablet Stopped by: Claretta Fraise, MD   Plavix 75 MG tablet Generic drug: clopidogrel Stopped by: Claretta Fraise, MD   sulfaSALAzine 500 MG tablet Commonly known as: AZULFIDINE Stopped by: Claretta Fraise, MD       TAKE these medications    albuterol 108 (90 Base) MCG/ACT inhaler Commonly known as: VENTOLIN HFA Inhale 2 puffs into the lungs every 6 (six) hours as needed for wheezing or shortness of breath.   Arava 20 MG tablet Generic drug: leflunomide 1 tablet Orally Once a day for RA for 30 days   aspirin EC 81 MG tablet Take 1 tablet (81 mg total) by mouth daily. Swallow whole.   calcium carbonate 1500 (600 Ca) MG Tabs tablet Commonly known as: OSCAL Take 1,500 mg by mouth daily with breakfast.   citalopram 40 MG tablet Commonly known as: CELEXA TAKE ONE TABLET ONCE DAILY   cyanocobalamin 1000 MCG tablet Commonly known as: VITAMIN B12 Take 1 tablet (1,000 mcg total) by mouth daily.   diclofenac Sodium 1 % Gel Commonly known as: Voltaren Apply 4 g topically 4 (four) times daily.   folic acid 1 MG tablet Commonly known as: FOLVITE Take 1 tablet (1 mg total) by mouth daily.   gabapentin 300 MG capsule Commonly known as: NEURONTIN Take 1 capsule (300 mg total) by mouth 3 (three) times daily.    hydroxychloroquine 200 MG tablet Commonly known as: PLAQUENIL Take 200 mg by mouth daily.   levothyroxine 125 MCG tablet Commonly known as: SYNTHROID Take 1 tablet (125 mcg total) by mouth every morning.   liothyronine 5 MCG tablet Commonly known as: CYTOMEL Take 2 tablets (10 mcg total) by mouth daily. Started by: Claretta Fraise, MD   megestrol 400 MG/10ML suspension Commonly known as: MEGACE Take 10 mLs (400 mg total) by mouth 2 (two) times daily.   pantoprazole 40 MG tablet Commonly known as: PROTONIX TAKE ONE TABLET BY MOUTH DAILY BEFORE BREAKFAST What changed: See the new instructions.   potassium chloride SA 20 MEQ tablet Commonly known as: KLOR-CON M TAKE ONE TABLET ONCE DAILY, NEED DOCTOR APPT. What changed: See the new instructions.   rosuvastatin 40 MG tablet Commonly known as: Crestor Take 1  tablet (40 mg total) by mouth daily. For cholesterol   traZODone 100 MG tablet Commonly known as: DESYREL Take 1 tablet (100 mg total) by mouth at bedtime as needed. for sleep What changed: when to take this         Follow-up: Return in about 6 weeks (around 10/06/2022).  Claretta Fraise, M.D.

## 2022-08-27 ENCOUNTER — Other Ambulatory Visit (HOSPITAL_COMMUNITY): Payer: Self-pay | Admitting: Hematology

## 2022-08-28 ENCOUNTER — Encounter: Payer: Self-pay | Admitting: Family Medicine

## 2022-08-29 ENCOUNTER — Inpatient Hospital Stay: Payer: Medicare Other | Attending: Hematology

## 2022-08-29 ENCOUNTER — Other Ambulatory Visit: Payer: Self-pay

## 2022-08-29 DIAGNOSIS — R803 Bence Jones proteinuria: Secondary | ICD-10-CM

## 2022-08-29 DIAGNOSIS — D649 Anemia, unspecified: Secondary | ICD-10-CM

## 2022-08-29 DIAGNOSIS — D500 Iron deficiency anemia secondary to blood loss (chronic): Secondary | ICD-10-CM

## 2022-08-29 DIAGNOSIS — E538 Deficiency of other specified B group vitamins: Secondary | ICD-10-CM

## 2022-08-29 LAB — SAMPLE TO BLOOD BANK

## 2022-08-29 LAB — CBC
HCT: 33.6 % — ABNORMAL LOW (ref 36.0–46.0)
Hemoglobin: 9.9 g/dL — ABNORMAL LOW (ref 12.0–15.0)
MCH: 27.3 pg (ref 26.0–34.0)
MCHC: 29.5 g/dL — ABNORMAL LOW (ref 30.0–36.0)
MCV: 92.8 fL (ref 80.0–100.0)
Platelets: 202 10*3/uL (ref 150–400)
RBC: 3.62 MIL/uL — ABNORMAL LOW (ref 3.87–5.11)
RDW: 18.7 % — ABNORMAL HIGH (ref 11.5–15.5)
WBC: 5.8 10*3/uL (ref 4.0–10.5)
nRBC: 0 % (ref 0.0–0.2)

## 2022-08-30 ENCOUNTER — Telehealth: Payer: Self-pay | Admitting: Gastroenterology

## 2022-08-30 NOTE — Telephone Encounter (Signed)
Please make patient a follow up in 3 months for iron deficiency anemia with [facility] or Dr. Gala Romney.   Venetia Night, MSN, APRN, FNP-BC, AGACNP-BC [facility]

## 2022-08-30 NOTE — Procedures (Signed)
Small Bowel Givens Capsule Study Procedure date:  08/12/22   PCP:  Dr. Claretta Fraise, MD  Indication for procedure:  Erica Beasley is a 66 year old female with a history of CKD, COPD, depression, HTN, GERD, diabetes, HLD, hypothyroidism, CVA in October 2023 who was recently seen in the office for hospital follow-up regarding iron deficiency anemia.  She had a 2 point hemoglobin drop while inpatient with heme-negative stool.  She has a history of iron deficiency with an extensive prior GI workup in 2021 and 2022.  She underwent EGD and colonoscopy in 2021 without significant abnormality and bone marrow biopsy in October 2022 with hypercellular marrow with trilineage hematopoiesis.  She follows with hematology for this.  Last iron infusion in August 2023.  Given need for recurrent blood transfusions and IV iron she was advised complete givens capsule to rule out small bowel as a source for anemia.   Patient data:  Wt: 46.9kg  Findings: Study complete to the cecum.  Scattered gastric debris throughout study. Few erosions noted in the first portion of the duodenum just after first duodenal image, 28 minutes and 30 minutes into the study.  Rest of visualized small bowel without any abnormality.. Increased motility.            First Gastric image:  00:01:44 First Duodenal image: 00:28:30 First Ileo-Cecal Valve image: 4:00:03 First Cecal image: 04:00:15 Gastric Passage time: 0h 36mSmall Bowel Passage time: 3h 350mSummary: Erica Beasley a [age]ear old female who underwent capsule endoscopy for further evaluation of iron deficiency anemia with prior workup with EGD and colonoscopy in 2021 without any significant abnormalities given need for recurrent blood transfusions and IV iron.  Underwent a bone marrow biopsy in October 2022 with hypercellular marrow with trilineage hematopoiesis, following with hematology.  Study complete to the cecum with few erosions noted in the first portion of the  duodenum.  Scattered gastric debris present intermittently throughout the study. She has been heme-negative multiple times without any signs/reports of GI bleeding.  Unlikely that these few erosions causes significant decrease in hemoglobin.  Recommendations: Continue to follow with hematology regarding iron deficiency for IV iron and as needed blood transfusions, IDA likely multifactorial.  Unlikely to need colonoscopy or EGD for further evaluation unless she were to develop overt GI bleeding in the future. Continue pantoprazole 40 mg daily. Closely monitor CBC Follow up in 3 months.     CoVenetia NightMSN, APRN, FNP-BC, AGACNP-BC [facility]

## 2022-08-31 LAB — UPEP/UIFE/LIGHT CHAINS/TP, 24-HR UR
% BETA, Urine: 45.6 %
ALPHA 1 URINE: 3.5 %
Albumin, U: 7.6 %
Alpha 2, Urine: 15.2 %
Free Kappa Lt Chains,Ur: 140 mg/L — ABNORMAL HIGH (ref 1.17–86.46)
Free Kappa/Lambda Ratio: 12.21 (ref 1.83–14.26)
Free Lambda Lt Chains,Ur: 11.47 mg/L (ref 0.27–15.21)
GAMMA GLOBULIN URINE: 28.1 %
Total Protein, Urine-Ur/day: 261 mg/24 hr — ABNORMAL HIGH (ref 30–150)
Total Protein, Urine: 47.5 mg/dL
Total Volume: 550

## 2022-09-02 ENCOUNTER — Inpatient Hospital Stay: Payer: 59

## 2022-09-05 ENCOUNTER — Ambulatory Visit (HOSPITAL_COMMUNITY)
Admission: RE | Admit: 2022-09-05 | Discharge: 2022-09-05 | Disposition: A | Discharge status: Home / Self Care | Payer: Medicare Other | Source: Ambulatory Visit | Attending: Family Medicine | Admitting: Family Medicine

## 2022-09-05 ENCOUNTER — Inpatient Hospital Stay: Payer: Medicare Other

## 2022-09-05 DIAGNOSIS — D649 Anemia, unspecified: Secondary | ICD-10-CM | POA: Diagnosis not present

## 2022-09-05 DIAGNOSIS — Z1231 Encounter for screening mammogram for malignant neoplasm of breast: Secondary | ICD-10-CM | POA: Diagnosis not present

## 2022-09-05 LAB — CBC
HCT: 31.6 % — ABNORMAL LOW (ref 36.0–46.0)
Hemoglobin: 9.5 g/dL — ABNORMAL LOW (ref 12.0–15.0)
MCH: 27.5 pg (ref 26.0–34.0)
MCHC: 30.1 g/dL (ref 30.0–36.0)
MCV: 91.3 fL (ref 80.0–100.0)
Platelets: 211 10*3/uL (ref 150–400)
RBC: 3.46 MIL/uL — ABNORMAL LOW (ref 3.87–5.11)
RDW: 17.6 % — ABNORMAL HIGH (ref 11.5–15.5)
WBC: 3.9 10*3/uL — ABNORMAL LOW (ref 4.0–10.5)
nRBC: 0 % (ref 0.0–0.2)

## 2022-09-05 LAB — SAMPLE TO BLOOD BANK

## 2022-09-06 NOTE — Telephone Encounter (Signed)
Spoke to pt, she informed me that she did see the MyChart message and had no questions at this time.

## 2022-09-12 ENCOUNTER — Inpatient Hospital Stay: Payer: Medicare Other

## 2022-09-12 DIAGNOSIS — D649 Anemia, unspecified: Secondary | ICD-10-CM

## 2022-09-12 LAB — CBC
HCT: 32.5 % — ABNORMAL LOW (ref 36.0–46.0)
Hemoglobin: 9.7 g/dL — ABNORMAL LOW (ref 12.0–15.0)
MCH: 27.3 pg (ref 26.0–34.0)
MCHC: 29.8 g/dL — ABNORMAL LOW (ref 30.0–36.0)
MCV: 91.5 fL (ref 80.0–100.0)
Platelets: 199 10*3/uL (ref 150–400)
RBC: 3.55 MIL/uL — ABNORMAL LOW (ref 3.87–5.11)
RDW: 17.2 % — ABNORMAL HIGH (ref 11.5–15.5)
WBC: 5.2 10*3/uL (ref 4.0–10.5)
nRBC: 0 % (ref 0.0–0.2)

## 2022-09-12 LAB — SAMPLE TO BLOOD BANK

## 2022-09-13 ENCOUNTER — Telehealth: Payer: Medicare Other | Admitting: Physician Assistant

## 2022-09-13 DIAGNOSIS — B9689 Other specified bacterial agents as the cause of diseases classified elsewhere: Secondary | ICD-10-CM | POA: Diagnosis not present

## 2022-09-13 DIAGNOSIS — J019 Acute sinusitis, unspecified: Secondary | ICD-10-CM

## 2022-09-13 MED ORDER — AMOXICILLIN-POT CLAVULANATE 875-125 MG PO TABS: 1.0000 | ORAL_TABLET | Freq: Two times a day (BID) | ORAL | 0 refills | Status: DC

## 2022-09-13 NOTE — Progress Notes (Signed)
I have spent 5 minutes in review of e-visit questionnaire, review and updating patient chart, medical decision making and response to patient.   Livier Hendel Cody Autie Vasudevan, PA-C    

## 2022-09-13 NOTE — Progress Notes (Signed)

## 2022-09-23 DIAGNOSIS — D649 Anemia, unspecified: Secondary | ICD-10-CM | POA: Diagnosis not present

## 2022-09-27 ENCOUNTER — Other Ambulatory Visit (HOSPITAL_COMMUNITY): Payer: Self-pay | Admitting: Hematology

## 2022-09-27 DIAGNOSIS — E876 Hypokalemia: Secondary | ICD-10-CM

## 2022-09-28 ENCOUNTER — Other Ambulatory Visit: Payer: Self-pay

## 2022-09-28 ENCOUNTER — Inpatient Hospital Stay: Payer: 59 | Attending: Hematology

## 2022-09-28 DIAGNOSIS — Z79899 Other long term (current) drug therapy: Secondary | ICD-10-CM | POA: Insufficient documentation

## 2022-09-28 DIAGNOSIS — M810 Age-related osteoporosis without current pathological fracture: Secondary | ICD-10-CM | POA: Insufficient documentation

## 2022-09-28 DIAGNOSIS — E876 Hypokalemia: Secondary | ICD-10-CM | POA: Insufficient documentation

## 2022-09-28 DIAGNOSIS — Z87891 Personal history of nicotine dependence: Secondary | ICD-10-CM | POA: Insufficient documentation

## 2022-09-28 DIAGNOSIS — F32A Depression, unspecified: Secondary | ICD-10-CM | POA: Insufficient documentation

## 2022-09-28 DIAGNOSIS — R809 Proteinuria, unspecified: Secondary | ICD-10-CM | POA: Diagnosis not present

## 2022-09-28 DIAGNOSIS — M069 Rheumatoid arthritis, unspecified: Secondary | ICD-10-CM | POA: Insufficient documentation

## 2022-09-28 DIAGNOSIS — Z803 Family history of malignant neoplasm of breast: Secondary | ICD-10-CM | POA: Insufficient documentation

## 2022-09-28 DIAGNOSIS — Z8616 Personal history of COVID-19: Secondary | ICD-10-CM | POA: Insufficient documentation

## 2022-09-28 DIAGNOSIS — D649 Anemia, unspecified: Secondary | ICD-10-CM

## 2022-09-28 LAB — CBC
HCT: 27.8 % — ABNORMAL LOW (ref 36.0–46.0)
Hemoglobin: 8.2 g/dL — ABNORMAL LOW (ref 12.0–15.0)
MCH: 27.3 pg (ref 26.0–34.0)
MCHC: 29.5 g/dL — ABNORMAL LOW (ref 30.0–36.0)
MCV: 92.7 fL (ref 80.0–100.0)
Platelets: 263 10*3/uL (ref 150–400)
RBC: 3 MIL/uL — ABNORMAL LOW (ref 3.87–5.11)
RDW: 15.9 % — ABNORMAL HIGH (ref 11.5–15.5)
WBC: 4.9 10*3/uL (ref 4.0–10.5)
nRBC: 0 % (ref 0.0–0.2)

## 2022-09-28 LAB — SAMPLE TO BLOOD BANK

## 2022-10-04 ENCOUNTER — Other Ambulatory Visit: Payer: Self-pay

## 2022-10-04 ENCOUNTER — Inpatient Hospital Stay: Payer: 59

## 2022-10-04 DIAGNOSIS — Z7689 Persons encountering health services in other specified circumstances: Secondary | ICD-10-CM | POA: Diagnosis not present

## 2022-10-04 DIAGNOSIS — D649 Anemia, unspecified: Secondary | ICD-10-CM

## 2022-10-04 DIAGNOSIS — I129 Hypertensive chronic kidney disease with stage 1 through stage 4 chronic kidney disease, or unspecified chronic kidney disease: Secondary | ICD-10-CM | POA: Diagnosis not present

## 2022-10-04 DIAGNOSIS — Z8616 Personal history of COVID-19: Secondary | ICD-10-CM | POA: Diagnosis not present

## 2022-10-04 DIAGNOSIS — R803 Bence Jones proteinuria: Secondary | ICD-10-CM

## 2022-10-04 DIAGNOSIS — E876 Hypokalemia: Secondary | ICD-10-CM | POA: Diagnosis not present

## 2022-10-04 DIAGNOSIS — Z803 Family history of malignant neoplasm of breast: Secondary | ICD-10-CM | POA: Diagnosis not present

## 2022-10-04 DIAGNOSIS — R809 Proteinuria, unspecified: Secondary | ICD-10-CM | POA: Diagnosis not present

## 2022-10-04 DIAGNOSIS — F32A Depression, unspecified: Secondary | ICD-10-CM | POA: Diagnosis not present

## 2022-10-04 DIAGNOSIS — Z79899 Other long term (current) drug therapy: Secondary | ICD-10-CM | POA: Diagnosis not present

## 2022-10-04 DIAGNOSIS — M069 Rheumatoid arthritis, unspecified: Secondary | ICD-10-CM | POA: Diagnosis not present

## 2022-10-04 DIAGNOSIS — M810 Age-related osteoporosis without current pathological fracture: Secondary | ICD-10-CM | POA: Diagnosis not present

## 2022-10-04 DIAGNOSIS — Z87891 Personal history of nicotine dependence: Secondary | ICD-10-CM | POA: Diagnosis not present

## 2022-10-04 DIAGNOSIS — D638 Anemia in other chronic diseases classified elsewhere: Secondary | ICD-10-CM | POA: Diagnosis not present

## 2022-10-04 DIAGNOSIS — N182 Chronic kidney disease, stage 2 (mild): Secondary | ICD-10-CM | POA: Diagnosis not present

## 2022-10-04 LAB — CBC WITH DIFFERENTIAL/PLATELET
Abs Immature Granulocytes: 0.06 10*3/uL (ref 0.00–0.07)
Basophils Absolute: 0 10*3/uL (ref 0.0–0.1)
Basophils Relative: 0 %
Eosinophils Absolute: 0.1 10*3/uL (ref 0.0–0.5)
Eosinophils Relative: 1 %
HCT: 32.5 % — ABNORMAL LOW (ref 36.0–46.0)
Hemoglobin: 9.5 g/dL — ABNORMAL LOW (ref 12.0–15.0)
Immature Granulocytes: 1 %
Lymphocytes Relative: 18 %
Lymphs Abs: 1.4 10*3/uL (ref 0.7–4.0)
MCH: 26.9 pg (ref 26.0–34.0)
MCHC: 29.2 g/dL — ABNORMAL LOW (ref 30.0–36.0)
MCV: 92.1 fL (ref 80.0–100.0)
Monocytes Absolute: 0.6 10*3/uL (ref 0.1–1.0)
Monocytes Relative: 7 %
Neutro Abs: 5.9 10*3/uL (ref 1.7–7.7)
Neutrophils Relative %: 73 %
Platelets: 256 10*3/uL (ref 150–400)
RBC: 3.53 MIL/uL — ABNORMAL LOW (ref 3.87–5.11)
RDW: 16.7 % — ABNORMAL HIGH (ref 11.5–15.5)
WBC: 8 10*3/uL (ref 4.0–10.5)
nRBC: 0 % (ref 0.0–0.2)

## 2022-10-04 LAB — IRON AND TIBC
Iron: 39 ug/dL (ref 28–170)
Saturation Ratios: 17 % (ref 10.4–31.8)
TIBC: 226 ug/dL — ABNORMAL LOW (ref 250–450)
UIBC: 187 ug/dL

## 2022-10-04 LAB — COMPREHENSIVE METABOLIC PANEL
ALT: 8 U/L (ref 0–44)
AST: 10 U/L — ABNORMAL LOW (ref 15–41)
Albumin: 3 g/dL — ABNORMAL LOW (ref 3.5–5.0)
Alkaline Phosphatase: 73 U/L (ref 38–126)
Anion gap: 8 (ref 5–15)
BUN: 11 mg/dL (ref 8–23)
CO2: 23 mmol/L (ref 22–32)
Calcium: 8.7 mg/dL — ABNORMAL LOW (ref 8.9–10.3)
Chloride: 103 mmol/L (ref 98–111)
Creatinine, Ser: 0.79 mg/dL (ref 0.44–1.00)
GFR, Estimated: >60 mL/min (ref >60)
Glucose, Bld: 173 mg/dL — ABNORMAL HIGH (ref 70–99)
Potassium: 3.9 mmol/L (ref 3.5–5.1)
Sodium: 134 mmol/L — ABNORMAL LOW (ref 135–145)
Total Bilirubin: 0.4 mg/dL (ref 0.3–1.2)
Total Protein: 6.5 g/dL (ref 6.5–8.1)

## 2022-10-04 LAB — SAMPLE TO BLOOD BANK

## 2022-10-04 LAB — FERRITIN: Ferritin: 1108 ng/mL — ABNORMAL HIGH (ref 11–307)

## 2022-10-04 LAB — LACTATE DEHYDROGENASE: LDH: 141 U/L (ref 98–192)

## 2022-10-04 LAB — VITAMIN B12: Vitamin B-12: 827 pg/mL (ref 180–914)

## 2022-10-05 LAB — KAPPA/LAMBDA LIGHT CHAINS
Kappa free light chain: 36.4 mg/L — ABNORMAL HIGH (ref 3.3–19.4)
Kappa, lambda light chain ratio: 1.71 — ABNORMAL HIGH (ref 0.26–1.65)
Lambda free light chains: 21.3 mg/L (ref 5.7–26.3)

## 2022-10-05 LAB — MISC LABCORP TEST (SEND OUT): Labcorp test code: 121251

## 2022-10-06 LAB — PROTEIN ELECTROPHORESIS, SERUM
A/G Ratio: 1 (ref 0.7–1.7)
Albumin ELP: 2.9 g/dL (ref 2.9–4.4)
Alpha-1-Globulin: 0.4 g/dL (ref 0.0–0.4)
Alpha-2-Globulin: 1 g/dL (ref 0.4–1.0)
Beta Globulin: 1 g/dL (ref 0.7–1.3)
Gamma Globulin: 0.7 g/dL (ref 0.4–1.8)
Globulin, Total: 3 g/dL (ref 2.2–3.9)
Total Protein ELP: 5.9 g/dL — ABNORMAL LOW (ref 6.0–8.5)

## 2022-10-07 ENCOUNTER — Encounter: Payer: Self-pay | Admitting: Hematology

## 2022-10-07 LAB — METHYLMALONIC ACID, SERUM: Methylmalonic Acid, Quantitative: 82 nmol/L (ref 0–378)

## 2022-10-10 LAB — IMMUNOFIXATION ELECTROPHORESIS
IgA: 281 mg/dL (ref 87–352)
IgG (Immunoglobin G), Serum: 826 mg/dL (ref 586–1602)
IgM (Immunoglobulin M), Srm: 158 mg/dL (ref 26–217)
Total Protein ELP: 5.9 g/dL — ABNORMAL LOW (ref 6.0–8.5)

## 2022-10-11 ENCOUNTER — Inpatient Hospital Stay: Payer: 59 | Admitting: Physician Assistant

## 2022-10-11 ENCOUNTER — Encounter: Payer: Self-pay | Admitting: Physician Assistant

## 2022-10-11 ENCOUNTER — Inpatient Hospital Stay (HOSPITAL_BASED_OUTPATIENT_CLINIC_OR_DEPARTMENT_OTHER): Payer: 59 | Admitting: Physician Assistant

## 2022-10-11 VITALS — BP 135/91 | HR 73 | Temp 98.7°F | Resp 18 | Wt 103.4 lb

## 2022-10-11 DIAGNOSIS — D649 Anemia, unspecified: Secondary | ICD-10-CM | POA: Diagnosis not present

## 2022-10-11 DIAGNOSIS — Z79899 Other long term (current) drug therapy: Secondary | ICD-10-CM | POA: Diagnosis not present

## 2022-10-11 DIAGNOSIS — Z87891 Personal history of nicotine dependence: Secondary | ICD-10-CM | POA: Diagnosis not present

## 2022-10-11 DIAGNOSIS — M069 Rheumatoid arthritis, unspecified: Secondary | ICD-10-CM | POA: Diagnosis not present

## 2022-10-11 DIAGNOSIS — Z8616 Personal history of COVID-19: Secondary | ICD-10-CM | POA: Diagnosis not present

## 2022-10-11 DIAGNOSIS — R803 Bence Jones proteinuria: Secondary | ICD-10-CM

## 2022-10-11 DIAGNOSIS — Z803 Family history of malignant neoplasm of breast: Secondary | ICD-10-CM | POA: Diagnosis not present

## 2022-10-11 DIAGNOSIS — R809 Proteinuria, unspecified: Secondary | ICD-10-CM | POA: Diagnosis not present

## 2022-10-11 DIAGNOSIS — F32A Depression, unspecified: Secondary | ICD-10-CM | POA: Diagnosis not present

## 2022-10-11 DIAGNOSIS — M810 Age-related osteoporosis without current pathological fracture: Secondary | ICD-10-CM | POA: Diagnosis not present

## 2022-10-11 DIAGNOSIS — E876 Hypokalemia: Secondary | ICD-10-CM | POA: Diagnosis not present

## 2022-10-11 NOTE — Progress Notes (Signed)
[facility], [place] [address]   CLINIC:  Medical Oncology/Hematology  PCP:  Claretta Fraise, MD [place]  [id number] [phone]   REASON FOR VISIT:  Follow-up for normocytic anemia  INTERVAL HISTORY:   [name] 67 y.o. female returns for routine follow-up of her normocytic anemia.  She received Feraheme on 05/18/2022 and 06/17/2022.  She was last seen by Tarri Abernethy PA-C on 08/04/2022.    She reports feeling fair at today's visit.  She has not had any hospitalizations since her stroke in October 2023.  Most recent blood transfusion was on 08/16/2022 (Hgb 7.6).  She reports that she has fatigue that "comes and goes," ever since her stroke and hospitalization.  She continues to have some residual left-sided weakness, but feels that this is slowly getting better.  Since her last visit, her sulfasalazine was stopped by rheumatology to see if that would improve her hemoglobin.   She continues to take leflunomide and hydroxychloroquine, recently started on Cosentyx soon per rheumatology.   She continues to deny any bright red blood per rectum, melena, epistaxis, or other signs of blood loss. She has mild dyspnea on exertion which she reports is chronic and stable.  She denies any pica, restless legs, headaches, lightheadedness, syncope, or chest pain. No B symptoms such as fever, chills, night sweats, unintentional weight loss.  She has 25% energy and 25% appetite.  Her weight is stable, but she reports that she has had some decreased appetite lately, although she still continues to take Megace.   ASSESSMENT & PLAN:  1.  Normocytic anemia -  BMBX on 07/19/2021: Mildly hypercellular marrow with trilineage hematopoiesis.  No increased ring sideroblasts or blasts.  3% polytypic plasma cells.  No large aggregates of plasma cells.  Iron stores increased.  Chromosome analysis normal. - Etiology of patient's anemia remains uncertain.    Likely some element of anemia of chronic disease with functional iron deficiency in the setting of severe rheumatoid arthritis. Myelosuppression from rheumatology medications (leflunomide, hydroxychloroquine, and previously sulfasalazine) is a significant possibility.   Patient has been NEGATIVE for Hemoccult stool on multiple occasions within the past year.  No obvious rectal bleeding or melena.  EGD, colonoscopy, capsule studies did not reveal any source of major blood loss. Bone marrow biopsy was nondiagnostic.  Hemolysis labs and nutritional deficiency work-up have been unremarkable, apart from significant functional iron deficiency (no improvement in hemoglobin after IV iron) No evidence of chronic kidney disease based on patient's creatinine or Cystatin C - She is taking vitamin B12 tablets at home. - Trial of monthly Feraheme x2 doses did NOT improve her hemoglobin.  Iron tablets discontinued due to elevated iron levels. - She has required intermittent blood transfusions, most recently on 08/16/2022 - Chronic fatigue is at baseline - No bright red blood per rectum or melena  - Most recent labs (10/04/2022): Normal creatinine 0.79, plus normal Cystatin C (no evidence of chronic kidney dysfunction) Normal LDH. Elevated ferritin (improved) at 1108, iron saturation 17% with low TIBC 226. Normal B12 827, normal MMA. - Hgb slightly improved since sulfasalazine was stopped (November 2023), with CBC on 10/04/2022 showing Hgb 9.5/MCV 92.1, normal platelets, WBC, differential. - PLAN: No indication for oral iron at this time. - Continue vitamin B12 supplement. - Monthly CBC with blood bank sample - Patient educated on alarm symptoms that would prompt immediate medical attention/ED visit.  - Labs and office visit in 3 months  2.  Bence-Jones proteinuria -24-hour urine done by Dr. Theador Hawthorne showed urine immunofixation positive for Bence-Jones proteinuria, kappa type.  24-hour total protein was 77  mg/day. -Free kappa light chains were 52.9, lambda light chains 29.8 with ratio of 1.78.  Serum immunofixation was unremarkable.  SPEP did not show any evidence of M spike. -Labs on 04/03/2020 shows negative immunofixation.  Kappa light chain 61.3, lambda light chains 32.3, ratio 1.9. -Skeletal survey on 04/03/2020 was negative for lytic lesions. - Most recent MGUS/myeloma panel (10/04/2022) 24-hour urine/UPEP/UIFE/TP (08/29/2022): Total protein 261 mg/24-hr.  Urine immunofixation normal, M spike not observed.  Mildly elevated urine kappa light chains 140, with normal lambda normal ratio. Serum immunofixation unremarkable, no monoclonal protein SPEP negative for M spike. Mildly elevated serum kappa free light chain 36.4, normal lambda 21.3, mildly elevated ratio 1.71. - No new onset bone pain or neurologic changes  - PLAN: No evidence of plasma cell dyscrasia in blood or urine at this time.  We will recheck labs and urine studies in 1 year.  3.  Weight loss - Prior history of significant unintentional weight loss - Appetite improved and weight loss stable after starting Megace - EGD on 04/28/2020 with stomach and duodenal biopsy negative. - CT CAP on 05/11/2020 showed long segment colonic thickening likely diverticular disease.  Stable small juxtapleural nodule in the right upper chest benign over 1 year.  Moderate large fat-containing left inguinal hernia.  Emphysema and diverticular calcification. - Colonoscopy on 06/08/2020 shows pancolonic diverticulosis, mild.  Normal-appearing colonic mucosa otherwise.  Random biopsies were negative. - PLAN: Continue Megace and Boost/Ensure daily  4.  Rheumatoid arthritis - Follows with [facility] - PLAN: Currently taking Cosentyx, hydroxychloroquine, leflunomide  5.  Osteoporosis - DEXA scan on 01/05/2022 with T score -2.7. - Recommended Prolia but her insurance did not cover it. - She received Zometa 4 mg on 02/23/2022. - PLAN: Next Zometa due  around 02/23/2023.  Continue calcium supplements.  6.  Hypokalemia - Continue potassium 20 mEq daily.  Potassium is normal.     7.  Depression - Continue Celexa 40 mg daily.     PLAN SUMMARY: >> Monthly CBC/D with BB sample >> Same-day labs (CBC/D, ferritin, iron/TIBC, CMP) and office visit in 3 months    REVIEW OF SYSTEMS:   Review of Systems  Constitutional:  Positive for fatigue. Negative for appetite change, chills, diaphoresis, fever and unexpected weight change.  HENT:   Negative for lump/mass and nosebleeds.   Eyes:  Negative for eye problems.  Respiratory:  Positive for shortness of breath (With exertion, mild/at baseline). Negative for cough and hemoptysis.   Cardiovascular:  Negative for chest pain, leg swelling and palpitations.  Gastrointestinal:  Negative for abdominal pain, blood in stool, constipation, diarrhea, nausea and vomiting.  Genitourinary:  Negative for hematuria.   Musculoskeletal:  Positive for arthralgias and back pain.  Skin: Negative.   Neurological:  Positive for extremity weakness (Residual deficit from CVA). Negative for dizziness, headaches and light-headedness.  Hematological:  Does not bruise/bleed easily.     PHYSICAL EXAM:  ECOG PERFORMANCE STATUS: 1 - Symptomatic but completely ambulatory  Vitals:   10/11/22 1458  BP: (!) 135/91  Pulse: 73  Resp: 18  Temp: 98.7 F (37.1 C)  SpO2: 98%   Filed Weights   10/11/22 1458  Weight: 103 lb 6.3 oz (46.9 kg)   Physical Exam Vitals reviewed.  Constitutional:      Appearance: Normal appearance.  Cardiovascular:     Rate  and Rhythm: Normal rate and regular rhythm.     Pulses: Normal pulses.     Heart sounds: Normal heart sounds.  Pulmonary:     Effort: Pulmonary effort is normal.     Breath sounds: Normal breath sounds.  Neurological:     General: No focal deficit present.     Mental Status: She is alert and oriented to person, place, and time.     Cranial Nerves: Cranial nerve deficit  (Slight left facial hemiparesis, left-sided tongue deviation) present.     Motor: Weakness (Mild residual left upper extremity weakness following CVA) present.  Psychiatric:        Mood and Affect: Mood normal.        Behavior: Behavior normal.     PAST MEDICAL/SURGICAL HISTORY:  Past Medical History:  Diagnosis Date   Aortic atherosclerosis ([place]) 11/13/2020   CKD (chronic kidney disease) stage 2, GFR 60-89 ml/min    COPD (chronic obstructive pulmonary disease) ([place])    COVID-19 06/2019   Depression    Essential hypertension    GERD (gastroesophageal reflux disease)    H/O Clostridium difficile infection 05/03/2019   History of diabetes mellitus    Hx of colonic polyps 05/03/2019   Hyperlipidemia    Hypothyroidism    Neuropathy    Rheumatoid arthritis ([place])    Stroke (cerebrum) ([place]) 07/09/2022   Stroke ([place]) 07/08/2022   Vitamin B12 deficiency    Vitamin D deficiency    Past Surgical History:  Procedure Laterality Date   BIOPSY  09/16/2019   Procedure: BIOPSY;  Surgeon: Daneil Dolin, MD;  [facility];  Service: Endoscopy;;   BIOPSY  04/28/2020   Procedure: BIOPSY;  Surgeon: Daneil Dolin, MD;  [facility];  Service: Endoscopy;;   BIOPSY  06/08/2020   Procedure: BIOPSY;  Surgeon: Daneil Dolin, MD;  [facility];  Service: Endoscopy;;   CATARACT EXTRACTION Bilateral    COLONOSCOPY  09/2010   Dr. Posey Pronto: mild diverticulsis in sigmoid colon.    COLONOSCOPY WITH PROPOFOL N/A 09/16/2019   [place]: Diverticulosis, random colon biopsies negative for microscopic colitis.   COLONOSCOPY WITH PROPOFOL N/A 06/08/2020   Procedure: COLONOSCOPY WITH PROPOFOL;  Surgeon: Daneil Dolin, MD;  [facility];  Service: Endoscopy;  Laterality: N/A;  9:15am   ESOPHAGOGASTRODUODENOSCOPY (EGD) WITH PROPOFOL N/A 04/28/2020   Procedure: ESOPHAGOGASTRODUODENOSCOPY (EGD) WITH PROPOFOL;  Surgeon: Daneil Dolin, MD;  [facility];  Service:  Endoscopy;  Laterality: N/A;  2:15   GIVENS CAPSULE STUDY N/A 08/12/2022   Procedure: GIVENS CAPSULE STUDY;  Surgeon: Daneil Dolin, MD;  [facility];  Service: Endoscopy;  Laterality: N/A;   YAG LASER APPLICATION Left 38/25/0539   Procedure: YAG LASER APPLICATION;  Surgeon: Williams Che, MD;  Location: [facility];  Service: Ophthalmology;  Laterality: Left;    SOCIAL HISTORY:  Social History   Socioeconomic History   Marital status: Widowed    Spouse name: Not on file   Number of children: 1   Years of education: Not on file   Highest education level: Not on file  Occupational History   Occupation: DISABLED  Tobacco Use   Smoking status: Former    Packs/day: 1.00    Years: 15.00    Total pack years: 15.00    Types: Cigarettes    Quit date: 09/11/2017    Years since quitting: 5.0   Smokeless tobacco: Former  Media planner  Vaping Use: Never used  Substance and Sexual Activity   Alcohol use: Never   Drug use: Never   Sexual activity: Not Currently  Other Topics Concern   Not on file  Social History Narrative   Not on file   Social Determinants of Health   Financial Resource Strain: Low Risk  (07/11/2022)   Overall Financial Resource Strain (CARDIA)    Difficulty of Paying Living Expenses: Not hard at all  Food Insecurity: No Food Insecurity (07/08/2022)   Hunger Vital Sign    Worried About Running Out of Food in the Last Year: Never true    Ran Out of Food in the Last Year: Never true  Transportation Needs: No Transportation Needs (07/11/2022)   PRAPARE - Hydrologist (Medical): No    Lack of Transportation (Non-Medical): No  Physical Activity: Insufficiently Active (09/03/2020)   Exercise Vital Sign    Days of Exercise per Week: 3 days    Minutes of Exercise per Session: 30 min  Stress: No Stress Concern Present (09/03/2020)   [place]    Feeling of Stress  : Only a little  Social Connections: Socially Isolated (09/03/2020)   Social Connection and Isolation Panel [NHANES]    Frequency of Communication with Friends and Family: More than three times a week    Frequency of Social Gatherings with Friends and Family: More than three times a week    Attends Religious Services: Never    Marine scientist or Organizations: No    Attends Archivist Meetings: Never    Marital Status: Widowed  Intimate Partner Violence: Not At Risk (07/08/2022)   Humiliation, Afraid, Rape, and Kick questionnaire    Fear of Current or Ex-Partner: No    Emotionally Abused: No    Physically Abused: No    Sexually Abused: No    FAMILY HISTORY:  Family History  Problem Relation Age of Onset   Stroke Mother    Heart disease Mother    Diabetes Mother    Breast cancer Mother    Other Father        stomach taken out for some reason   Migraines Daughter    Colon cancer Neg Hx     CURRENT MEDICATIONS:  Outpatient Encounter Medications as of 10/11/2022  Medication Sig   albuterol (VENTOLIN HFA) 108 (90 Base) MCG/ACT inhaler Inhale 2 puffs into the lungs every 6 (six) hours as needed for wheezing or shortness of breath.   amoxicillin-clavulanate (AUGMENTIN) 875-125 MG tablet Take 1 tablet by mouth 2 (two) times daily.   aspirin EC 81 MG tablet Take 1 tablet (81 mg total) by mouth daily. Swallow whole.   calcium carbonate (OSCAL) 1500 (600 Ca) MG TABS tablet Take 1,500 mg by mouth daily with breakfast.   citalopram (CELEXA) 40 MG tablet TAKE ONE TABLET ONCE DAILY   COSENTYX SENSOREADY, 300 MG, 150 MG/ML SOAJ Inject into the skin.   cyanocobalamin (VITAMIN B12) 1000 MCG tablet Take 1 tablet (1,000 mcg total) by mouth daily.   diclofenac Sodium (VOLTAREN) 1 % GEL Apply 4 g topically 4 (four) times daily.   folic acid (FOLVITE) 1 MG tablet Take 1 tablet (1 mg total) by mouth daily.   gabapentin (NEURONTIN) 300 MG capsule Take 1 capsule (300 mg total) by mouth  3 (three) times daily.   hydroxychloroquine (PLAQUENIL) 200 MG tablet Take 200 mg by mouth daily.    leflunomide (  ARAVA) 20 MG tablet 1 tablet Orally Once a day for RA for 30 days   levothyroxine (SYNTHROID) 125 MCG tablet Take 1 tablet (125 mcg total) by mouth every morning.   liothyronine (CYTOMEL) 5 MCG tablet Take 2 tablets (10 mcg total) by mouth daily.   megestrol (MEGACE) 40 MG/ML suspension TAKE 10 MLS BY MOUTH 2 TIMES A DAY   pantoprazole (PROTONIX) 40 MG tablet TAKE ONE TABLET BY MOUTH DAILY BEFORE BREAKFAST (Patient taking differently: Take 40 mg by mouth daily.)   potassium chloride SA (KLOR-CON M) 20 MEQ tablet TAKE ONE TABLET ONCE DAILY, NEED DOCTOR APPT.   rosuvastatin (CRESTOR) 40 MG tablet Take 1 tablet (40 mg total) by mouth daily. For cholesterol   traZODone (DESYREL) 100 MG tablet Take 1 tablet (100 mg total) by mouth at bedtime as needed. for sleep (Patient taking differently: Take 100 mg by mouth at bedtime. for sleep)   No facility-administered encounter medications on file as of 10/11/2022.    ALLERGIES:  Allergies  Allergen Reactions   Adalimumab     Other reaction(s): no response Pt is unaware of this Other reaction(s): no response   Methotrexate Other (See Comments)    Other reaction(s): GI side effects   Upadacitinib Other (See Comments)    Other reaction(s): GI side effects    LABORATORY DATA:  I have reviewed the labs as listed.  CBC    Component Value Date/Time   WBC 8.0 10/04/2022 1203   RBC 3.53 (L) 10/04/2022 1203   HGB 9.5 (L) 10/04/2022 1203   HGB 7.7 (LL) 04/20/2022 1103   HCT 32.5 (L) 10/04/2022 1203   HCT 25.4 (L) 04/20/2022 1103   PLT 256 10/04/2022 1203   PLT 453 (H) 04/20/2022 1103   MCV 92.1 10/04/2022 1203   MCV 89 04/20/2022 1103   MCH 26.9 10/04/2022 1203   MCHC 29.2 (L) 10/04/2022 1203   RDW 16.7 (H) 10/04/2022 1203   RDW 14.5 04/20/2022 1103   LYMPHSABS 1.4 10/04/2022 1203   LYMPHSABS 1.8 04/20/2022 1103   MONOABS 0.6  10/04/2022 1203   EOSABS 0.1 10/04/2022 1203   EOSABS 0.2 04/20/2022 1103   BASOSABS 0.0 10/04/2022 1203   BASOSABS 0.0 04/20/2022 1103      Latest Ref Rng & Units 10/04/2022   12:03 PM 08/15/2022   12:17 PM 07/28/2022    9:55 AM  CMP  Glucose 70 - 99 mg/dL 173   171   BUN 8 - 23 mg/dL 11   11   Creatinine 0.44 - 1.00 mg/dL 0.79   0.90   Sodium 135 - 145 mmol/L 134   138   Potassium 3.5 - 5.1 mmol/L 3.9   4.3   Chloride 98 - 111 mmol/L 103   106   CO2 22 - 32 mmol/L 23   23   Calcium 8.9 - 10.3 mg/dL 8.7   9.0   Total Protein 6.5 - 8.1 g/dL 6.5   6.7   Total Bilirubin 0.3 - 1.2 mg/dL 0.4  0.3  0.4   Alkaline Phos 38 - 126 U/L 73   70   AST 15 - 41 U/L 10   10   ALT 0 - 44 U/L 8   6     DIAGNOSTIC IMAGING:  I have independently reviewed the relevant imaging and discussed with the patient.   WRAP UP:  All questions were answered. The patient knows to call the clinic with any problems, questions or concerns.  Medical decision making:  Moderate  Time spent on visit: I spent 25 minutes counseling the patient face to face. The total time spent in the appointment was 40 minutes and more than 50% was on counseling.  Harriett Rush, PA-C  10/12/22 6:08 PM

## 2022-10-11 NOTE — Patient Instructions (Signed)
[facility] **   You were seen today by Tarri Abernethy PA-C for your anemia.    ANEMIA: Your anemia is suspected to be due to chronic inflammation as well as side effect of your rheumatoid arthritis medications. Continue to take vitamin B12 as recommended by your primary care doctor. You do not need any iron supplement at this time. We will continue to check your blood counts once per month. We will see you for follow-up visit in 3 months.  ** Thank you for trusting me with your healthcare!  I strive to provide all of my patients with quality care at each visit.  If you receive a survey for this visit, I would be so grateful to you for taking the time to provide feedback.  Thank you in advance!  ~ Shantell Belongia                   Dr. Derek Jack   &   Tarri Abernethy, PA-C   - - - - - - - - - - - - - - - - - -    Thank you for choosing [place] [facility] to provide your oncology and hematology care.  To afford each patient quality time with our provider, please arrive at least 15 minutes before your scheduled appointment time.   If you have a lab appointment with the [facility] please come in thru the Main Entrance and check in at the main information desk.  You need to re-schedule your appointment should you arrive 10 or more minutes late.  We strive to give you quality time with our providers, and arriving late affects you and other patients whose appointments are after yours.  Also, if you no show three or more times for appointments you may be dismissed from the clinic at the providers discretion.     Again, thank you for choosing [facility].  Our hope is that these requests will decrease the amount of time that you wait before being seen by our physicians.       _____________________________________________________________  Should you have questions after  your visit to [facility], please contact our office at [phone] and follow the prompts.  Our office hours are 8:00 a.m. and 4:30 p.m. Monday - Friday.  Please note that voicemails left after 4:00 p.m. may not be returned until the following business day.  We are closed weekends and major holidays.  You do have access to a nurse 24-7, just call the main number to the clinic [phone] and do not press any options, hold on the line and a nurse will answer the phone.    For prescription refill requests, have your pharmacy contact our office and allow 72 hours.

## 2022-10-12 ENCOUNTER — Encounter: Payer: Self-pay | Admitting: Hematology

## 2022-10-17 ENCOUNTER — Encounter: Payer: Self-pay | Admitting: Hematology

## 2022-10-20 DIAGNOSIS — D649 Anemia, unspecified: Secondary | ICD-10-CM | POA: Diagnosis not present

## 2022-10-20 DIAGNOSIS — M0579 Rheumatoid arthritis with rheumatoid factor of multiple sites without organ or systems involvement: Secondary | ICD-10-CM | POA: Diagnosis not present

## 2022-10-21 ENCOUNTER — Encounter: Payer: Medicare Other | Admitting: Family Medicine

## 2022-10-24 ENCOUNTER — Ambulatory Visit (INDEPENDENT_AMBULATORY_CARE_PROVIDER_SITE_OTHER): Payer: 59 | Admitting: Family Medicine

## 2022-10-24 ENCOUNTER — Encounter: Payer: Self-pay | Admitting: Family Medicine

## 2022-10-24 VITALS — BP 136/77 | HR 74 | Temp 97.6°F | Ht 64.0 in | Wt 102.4 lb

## 2022-10-24 DIAGNOSIS — G629 Polyneuropathy, unspecified: Secondary | ICD-10-CM | POA: Diagnosis not present

## 2022-10-24 DIAGNOSIS — I10 Essential (primary) hypertension: Secondary | ICD-10-CM

## 2022-10-24 DIAGNOSIS — E039 Hypothyroidism, unspecified: Secondary | ICD-10-CM | POA: Diagnosis not present

## 2022-10-24 DIAGNOSIS — E559 Vitamin D deficiency, unspecified: Secondary | ICD-10-CM

## 2022-10-24 DIAGNOSIS — Z0001 Encounter for general adult medical examination with abnormal findings: Secondary | ICD-10-CM

## 2022-10-24 DIAGNOSIS — Z0000 Encounter for general adult medical examination without abnormal findings: Secondary | ICD-10-CM

## 2022-10-24 DIAGNOSIS — E782 Mixed hyperlipidemia: Secondary | ICD-10-CM

## 2022-10-24 DIAGNOSIS — F339 Major depressive disorder, recurrent, unspecified: Secondary | ICD-10-CM

## 2022-10-24 DIAGNOSIS — R739 Hyperglycemia, unspecified: Secondary | ICD-10-CM | POA: Diagnosis not present

## 2022-10-24 DIAGNOSIS — E538 Deficiency of other specified B group vitamins: Secondary | ICD-10-CM

## 2022-10-24 LAB — URINALYSIS
Bilirubin, UA: NEGATIVE
Glucose, UA: NEGATIVE
Nitrite, UA: NEGATIVE
RBC, UA: NEGATIVE
Specific Gravity, UA: >1.03 — ABNORMAL HIGH (ref 1.005–1.030)
Urobilinogen, Ur: 0.2 mg/dL (ref 0.2–1.0)
pH, UA: 5 (ref 5.0–7.5)

## 2022-10-24 LAB — BAYER DCA HB A1C WAIVED: HB A1C (BAYER DCA - WAIVED): 9 % — ABNORMAL HIGH (ref 4.8–5.6)

## 2022-10-24 MED ORDER — GABAPENTIN 300 MG PO CAPS: 300.0000 mg | ORAL_CAPSULE | Freq: Three times a day (TID) | ORAL | 3 refills | Status: DC

## 2022-10-24 MED ORDER — LIOTHYRONINE SODIUM 5 MCG PO TABS: 10.0000 ug | ORAL_TABLET | Freq: Every day | ORAL | 3 refills | Status: DC

## 2022-10-24 MED ORDER — LEVOTHYROXINE SODIUM 125 MCG PO TABS: 125.0000 ug | ORAL_TABLET | Freq: Every morning | ORAL | 3 refills | Status: DC

## 2022-10-24 MED ORDER — METFORMIN HCL ER 500 MG PO TB24: 500.0000 mg | ORAL_TABLET | Freq: Every day | ORAL | 2 refills | Status: DC

## 2022-10-24 MED ORDER — TRAZODONE HCL 100 MG PO TABS: 100.0000 mg | ORAL_TABLET | Freq: Every evening | ORAL | 3 refills | Status: DC | PRN

## 2022-10-24 NOTE — Progress Notes (Signed)
Subjective:  Patient ID: Erica Beasley, female    DOB: 1955-11-25  Age: 67 y.o. MRN: [id number]  CC: Annual Exam   HPI MEHEK GREGA presents for CPE.  Treated for RA using prednisone recently. Very thirsty. Brought in lab work from rheumatologist showing glucose was 309 last week.  She is currently taking Cosentyx having had her first injection 4 weeks ago.  Apparently the next 1 is planned for tomorrow.  Additionally she is taking Plaquenil and Arava.  Her main concern is that there is still left knee pain.  She saw an orthopedist who may have suggested a procedure.  She is not clear on this point.  She only says that the rheumatologist told her it was arthritis, so she did not go back to the orthopedist.  She had diabetes in the remote past.  Last time she had an A1c several years ago, that number was 4.8.  She was considered to not be a diabetic since then.  However she has had to take prednisone 3 times recently.  Patient presents for follow-up on  thyroid. The patient has a history of hypothyroidism for many years. It has been stable recently. Pt. denies any change in  voice, loss of hair, heat or cold intolerance. Energy level has been adequate to good. Patient denies constipation and diarrhea. No myxedema. Medication is as noted below. Verified that pt is taking it daily on an empty stomach. Well tolerated.  She is also taking Cytomel to have a T3 supplement.  Patient in for follow-up of GERD. Currently asymptomatic taking  PPI daily. There is no chest pain or heartburn. No hematemesis and no melena. No dysphagia or choking. Onset is remote. Progression is stable. Complicating factors, none.   Patient also has a chronic anemia for which she sees Dr. Delton Coombes.  Based on indices is more likely to be anemia of chronic disease than an iron deficiency.  Kidney function as noted on the blood work brought in is showing an EGFR of 82.  The protein and albumin remain low and the glucose is as  mentioned above.  She is up-to-date on her mammogram it was negative.  She defers breast exam.     10/24/2022    1:47 PM 08/25/2022   11:14 AM 07/29/2022    1:42 PM  Depression screen PHQ 2/9  Decreased Interest 0 0 0  Down, Depressed, Hopeless 0 0 0  PHQ - 2 Score 0 0 0    History Thea has a past medical history of Aortic atherosclerosis ([place]) (11/13/2020), CKD (chronic kidney disease) stage 2, GFR 60-89 ml/min, COPD (chronic obstructive pulmonary disease) ([place]), COVID-19 (06/2019), Depression, Essential hypertension, GERD (gastroesophageal reflux disease), H/O Clostridium difficile infection (05/03/2019), History of diabetes mellitus, colonic polyps (05/03/2019), Hyperlipidemia, Hypothyroidism, Neuropathy, Rheumatoid arthritis ([place]), 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   She has a past surgical history that includes Yag laser application (Left, 73/22/0254); Colonoscopy (09/2010); Colonoscopy with propofol (N/A, 09/16/2019); biopsy (09/16/2019); Esophagogastroduodenoscopy (egd) with propofol (N/A, 04/28/2020); biopsy (04/28/2020); Cataract extraction (Bilateral); Colonoscopy with propofol (N/A, 06/08/2020); biopsy (06/08/2020); and Givens capsule study (N/A, 08/12/2022).   Her family history includes Breast cancer in her mother; Diabetes in her mother; Heart disease in her mother; Migraines in her daughter; Other in her father; Stroke in her mother.She reports that she quit smoking about 5 years ago. Her smoking use included cigarettes. She has a 15.00 pack-year smoking history. She has quit using  smokeless tobacco. She reports that she does not drink alcohol and does not use drugs.    ROS Review of Systems  Constitutional: Negative.  Negative for activity change, appetite change and fatigue.  HENT:  Negative for congestion.   Eyes:  Negative for visual disturbance.  Respiratory:  Negative for shortness of breath.    Cardiovascular:  Negative for chest pain.  Gastrointestinal:  Negative for abdominal pain, constipation, diarrhea, nausea and vomiting.  Genitourinary:  Negative for difficulty urinating.  Musculoskeletal:  Positive for arthralgias (knees, hands). Negative for myalgias.  Neurological:  Negative for headaches.  Psychiatric/Behavioral:  Negative for sleep disturbance.     Objective:  BP 136/77   Pulse 74   Temp 97.6 F (36.4 C)   Ht '5\' 4"'$  (1.626 m)   Wt 102 lb 6.4 oz (46.4 kg)   SpO2 97%   BMI 17.58 kg/m   BP Readings from Last 3 Encounters:  10/24/22 136/77  10/11/22 (!) 135/91  08/25/22 133/79    Wt Readings from Last 3 Encounters:  10/24/22 102 lb 6.4 oz (46.4 kg)  10/11/22 103 lb 6.3 oz (46.9 kg)  08/25/22 103 lb 6.4 oz (46.9 kg)     Physical Exam Constitutional:      General: She is not in acute distress.    Appearance: She is well-developed.  HENT:     Head: Normocephalic and atraumatic.  Eyes:     Conjunctiva/sclera: Conjunctivae normal.     Pupils: Pupils are equal, round, and reactive to light.  Neck:     Thyroid: No thyromegaly.  Cardiovascular:     Rate and Rhythm: Normal rate and regular rhythm.     Heart sounds: Normal heart sounds. No murmur heard. Pulmonary:     Effort: Pulmonary effort is normal. No respiratory distress.     Breath sounds: Normal breath sounds. No wheezing or rales.  Abdominal:     General: Bowel sounds are normal. There is no distension.     Palpations: Abdomen is soft.     Tenderness: There is no abdominal tenderness.  Musculoskeletal:        General: Normal range of motion.     Cervical back: Normal range of motion and neck supple.  Lymphadenopathy:     Cervical: No cervical adenopathy.  Skin:    General: Skin is warm and dry.  Neurological:     Mental Status: She is alert and oriented to person, place, and time.  Psychiatric:        Behavior: Behavior normal.        Thought Content: Thought content normal.         Judgment: Judgment normal.       Assessment & Plan:   Joceline was seen today for annual exam.  Diagnoses and all orders for this visit:  Well adult exam -     CMP14+EGFR -     Lipid panel -     VITAMIN D 25 Hydroxy (Vit-D Deficiency, Fractures) -     Urinalysis -     TSH + free T4  Hypothyroidism, unspecified type -     TSH + free T4 -     TSH + free T4  Vitamin D deficiency -     VITAMIN D 25 Hydroxy (Vit-D Deficiency, Fractures)  Mixed hyperlipidemia -     Lipid panel  Essential hypertension -     CMP14+EGFR  Neuropathy -     gabapentin (NEURONTIN) 300 MG capsule; Take 1 capsule (300 mg  total) by mouth 3 (three) times daily.  Acquired hypothyroidism -     levothyroxine (SYNTHROID) 125 MCG tablet; Take 1 tablet (125 mcg total) by mouth every morning.  Depression, recurrent ([place]) -     traZODone (DESYREL) 100 MG tablet; Take 1 tablet (100 mg total) by mouth at bedtime as needed. for sleep  B12 deficiency -     Vitamin B12  Hyperglycemia -     Bayer DCA Hb A1c Waived  Other orders -     liothyronine (CYTOMEL) 5 MCG tablet; Take 2 tablets (10 mcg total) by mouth daily. -     metFORMIN (GLUCOPHAGE-XR) 500 MG 24 hr tablet; Take 1 tablet (500 mg total) by mouth daily with breakfast.       I have discontinued Jessikah A. Bluitt's amoxicillin-clavulanate. I am also having her start on metFORMIN. Additionally, I am having her maintain her hydroxychloroquine, albuterol, folic acid, citalopram, calcium carbonate, pantoprazole, diclofenac Sodium, aspirin EC, rosuvastatin, leflunomide, cyanocobalamin, megestrol, potassium chloride SA, Cosentyx Sensoready (300 MG), gabapentin, levothyroxine, liothyronine, and traZODone.  Allergies as of 10/24/2022       Reactions   Adalimumab    Other reaction(s): no response Pt is unaware of this Other reaction(s): no response   Methotrexate Other (See Comments)   Other reaction(s): GI side effects   Upadacitinib Other (See Comments)    Other reaction(s): GI side effects        Medication List        Accurate as of October 24, 2022  2:56 PM. If you have any questions, ask your nurse or doctor.          STOP taking these medications    amoxicillin-clavulanate 875-125 MG tablet Commonly known as: AUGMENTIN Stopped by: Claretta Fraise, MD       TAKE these medications    albuterol 108 (90 Base) MCG/ACT inhaler Commonly known as: VENTOLIN HFA Inhale 2 puffs into the lungs every 6 (six) hours as needed for wheezing or shortness of breath.   Arava 20 MG tablet Generic drug: leflunomide 1 tablet Orally Once a day for RA for 30 days   aspirin EC 81 MG tablet Take 1 tablet (81 mg total) by mouth daily. Swallow whole.   calcium carbonate 1500 (600 Ca) MG Tabs tablet Commonly known as: OSCAL Take 1,500 mg by mouth daily with breakfast.   citalopram 40 MG tablet Commonly known as: CELEXA TAKE ONE TABLET ONCE DAILY   Cosentyx Sensoready (300 MG) 150 MG/ML Soaj Generic drug: Secukinumab (300 MG Dose) Inject into the skin.   cyanocobalamin 1000 MCG tablet Commonly known as: VITAMIN B12 Take 1 tablet (1,000 mcg total) by mouth daily.   diclofenac Sodium 1 % Gel Commonly known as: Voltaren Apply 4 g topically 4 (four) times daily.   folic acid 1 MG tablet Commonly known as: FOLVITE Take 1 tablet (1 mg total) by mouth daily.   gabapentin 300 MG capsule Commonly known as: NEURONTIN Take 1 capsule (300 mg total) by mouth 3 (three) times daily.   hydroxychloroquine 200 MG tablet Commonly known as: PLAQUENIL Take 200 mg by mouth daily.   levothyroxine 125 MCG tablet Commonly known as: SYNTHROID Take 1 tablet (125 mcg total) by mouth every morning.   liothyronine 5 MCG tablet Commonly known as: CYTOMEL Take 2 tablets (10 mcg total) by mouth daily.   megestrol 40 MG/ML suspension Commonly known as: MEGACE TAKE 10 MLS BY MOUTH 2 TIMES A DAY   metFORMIN 500 MG 24 hr  tablet Commonly known as:  GLUCOPHAGE-XR Take 1 tablet (500 mg total) by mouth daily with breakfast. Started by: Claretta Fraise, MD   pantoprazole 40 MG tablet Commonly known as: PROTONIX TAKE ONE TABLET BY MOUTH DAILY BEFORE BREAKFAST What changed: See the new instructions.   potassium chloride SA 20 MEQ tablet Commonly known as: KLOR-CON M TAKE ONE TABLET ONCE DAILY, NEED DOCTOR APPT.   rosuvastatin 40 MG tablet Commonly known as: Crestor Take 1 tablet (40 mg total) by mouth daily. For cholesterol   traZODone 100 MG tablet Commonly known as: DESYREL Take 1 tablet (100 mg total) by mouth at bedtime as needed. for sleep What changed:  when to take this reasons to take this         Follow-up: Return in about 3 weeks (around 11/14/2022) for diabetes.  Claretta Fraise, M.D.

## 2022-10-25 DIAGNOSIS — M0579 Rheumatoid arthritis with rheumatoid factor of multiple sites without organ or systems involvement: Secondary | ICD-10-CM | POA: Diagnosis not present

## 2022-10-25 DIAGNOSIS — M1991 Primary osteoarthritis, unspecified site: Secondary | ICD-10-CM | POA: Diagnosis not present

## 2022-10-25 DIAGNOSIS — L409 Psoriasis, unspecified: Secondary | ICD-10-CM | POA: Diagnosis not present

## 2022-10-25 DIAGNOSIS — M5136 Other intervertebral disc degeneration, lumbar region: Secondary | ICD-10-CM | POA: Diagnosis not present

## 2022-10-25 DIAGNOSIS — D649 Anemia, unspecified: Secondary | ICD-10-CM | POA: Diagnosis not present

## 2022-10-25 DIAGNOSIS — M25561 Pain in right knee: Secondary | ICD-10-CM | POA: Diagnosis not present

## 2022-10-25 DIAGNOSIS — M25562 Pain in left knee: Secondary | ICD-10-CM | POA: Diagnosis not present

## 2022-10-25 DIAGNOSIS — Z79899 Other long term (current) drug therapy: Secondary | ICD-10-CM | POA: Diagnosis not present

## 2022-10-25 DIAGNOSIS — Z681 Body mass index (BMI) 19 or less, adult: Secondary | ICD-10-CM | POA: Diagnosis not present

## 2022-10-25 DIAGNOSIS — L4059 Other psoriatic arthropathy: Secondary | ICD-10-CM | POA: Diagnosis not present

## 2022-10-25 LAB — CMP14+EGFR
ALT: 12 IU/L (ref 0–32)
AST: 8 IU/L (ref 0–40)
Albumin/Globulin Ratio: 1.6 (ref 1.2–2.2)
Albumin: 3.7 g/dL — ABNORMAL LOW (ref 3.9–4.9)
Alkaline Phosphatase: 87 IU/L (ref 44–121)
BUN/Creatinine Ratio: 15 (ref 12–28)
BUN: 12 mg/dL (ref 8–27)
Bilirubin Total: 0.2 mg/dL (ref 0.0–1.2)
CO2: 22 mmol/L (ref 20–29)
Calcium: 9.1 mg/dL (ref 8.7–10.3)
Chloride: 103 mmol/L (ref 96–106)
Creatinine, Ser: 0.79 mg/dL (ref 0.57–1.00)
Globulin, Total: 2.3 g/dL (ref 1.5–4.5)
Glucose: 234 mg/dL — ABNORMAL HIGH (ref 70–99)
Potassium: 3.6 mmol/L (ref 3.5–5.2)
Sodium: 140 mmol/L (ref 134–144)
Total Protein: 6 g/dL (ref 6.0–8.5)
eGFR: 83 mL/min/{1.73_m2} (ref >59)

## 2022-10-25 LAB — VITAMIN B12: Vitamin B-12: 1857 pg/mL — ABNORMAL HIGH (ref 232–1245)

## 2022-10-25 LAB — LIPID PANEL
Chol/HDL Ratio: 1.8 ratio (ref 0.0–4.4)
Cholesterol, Total: 121 mg/dL (ref 100–199)
HDL: 66 mg/dL (ref >39)
LDL Chol Calc (NIH): 37 mg/dL (ref 0–99)
Triglycerides: 100 mg/dL (ref 0–149)
VLDL Cholesterol Cal: 18 mg/dL (ref 5–40)

## 2022-10-25 LAB — VITAMIN D 25 HYDROXY (VIT D DEFICIENCY, FRACTURES): Vit D, 25-Hydroxy: 38.1 ng/mL (ref 30.0–100.0)

## 2022-10-25 LAB — TSH+FREE T4
Free T4: 1.66 ng/dL (ref 0.82–1.77)
TSH: 1.16 u[IU]/mL (ref 0.450–4.500)

## 2022-10-28 ENCOUNTER — Other Ambulatory Visit: Payer: Self-pay | Admitting: Hematology

## 2022-10-28 DIAGNOSIS — F339 Major depressive disorder, recurrent, unspecified: Secondary | ICD-10-CM

## 2022-10-31 ENCOUNTER — Other Ambulatory Visit: Payer: Self-pay | Admitting: *Deleted

## 2022-10-31 MED ORDER — METFORMIN HCL ER 500 MG PO TB24: 500.0000 mg | ORAL_TABLET | Freq: Every day | ORAL | 0 refills | Status: DC

## 2022-11-02 DIAGNOSIS — R803 Bence Jones proteinuria: Secondary | ICD-10-CM | POA: Diagnosis not present

## 2022-11-02 DIAGNOSIS — I959 Hypotension, unspecified: Secondary | ICD-10-CM | POA: Diagnosis not present

## 2022-11-02 DIAGNOSIS — N182 Chronic kidney disease, stage 2 (mild): Secondary | ICD-10-CM | POA: Diagnosis not present

## 2022-11-02 DIAGNOSIS — D638 Anemia in other chronic diseases classified elsewhere: Secondary | ICD-10-CM | POA: Diagnosis not present

## 2022-11-02 DIAGNOSIS — M0579 Rheumatoid arthritis with rheumatoid factor of multiple sites without organ or systems involvement: Secondary | ICD-10-CM | POA: Diagnosis not present

## 2022-11-10 ENCOUNTER — Inpatient Hospital Stay: Payer: 59 | Attending: Hematology

## 2022-11-10 DIAGNOSIS — D649 Anemia, unspecified: Secondary | ICD-10-CM | POA: Diagnosis not present

## 2022-11-10 LAB — CBC
HCT: 29.6 % — ABNORMAL LOW (ref 36.0–46.0)
Hemoglobin: 8.7 g/dL — ABNORMAL LOW (ref 12.0–15.0)
MCH: 27.1 pg (ref 26.0–34.0)
MCHC: 29.4 g/dL — ABNORMAL LOW (ref 30.0–36.0)
MCV: 92.2 fL (ref 80.0–100.0)
Platelets: 182 10*3/uL (ref 150–400)
RBC: 3.21 MIL/uL — ABNORMAL LOW (ref 3.87–5.11)
RDW: 16.6 % — ABNORMAL HIGH (ref 11.5–15.5)
WBC: 5.3 10*3/uL (ref 4.0–10.5)
nRBC: 0 % (ref 0.0–0.2)

## 2022-11-10 LAB — SAMPLE TO BLOOD BANK

## 2022-11-14 ENCOUNTER — Ambulatory Visit: Payer: 59 | Admitting: Family Medicine

## 2022-11-24 ENCOUNTER — Encounter: Payer: Self-pay | Admitting: Radiology

## 2022-11-28 ENCOUNTER — Other Ambulatory Visit: Payer: Self-pay | Admitting: Hematology

## 2022-11-29 ENCOUNTER — Ambulatory Visit (INDEPENDENT_AMBULATORY_CARE_PROVIDER_SITE_OTHER): Payer: 59 | Admitting: Internal Medicine

## 2022-11-29 ENCOUNTER — Encounter: Payer: Self-pay | Admitting: Internal Medicine

## 2022-11-29 VITALS — BP 150/87 | HR 67 | Temp 98.2°F | Ht 64.0 in | Wt 100.0 lb

## 2022-11-29 DIAGNOSIS — D508 Other iron deficiency anemias: Secondary | ICD-10-CM

## 2022-11-29 NOTE — Patient Instructions (Signed)
I was good to see you again today!  You are doing very well from a GI standpoint.  There is no longer a good indication for pantoprazole or Protonix.  I recommend you stop this medication.  No further GI evaluation warranted at this time  You should have another colonoscopy in 2026 (history of colonic polyps)  Unless something comes up, plan to see you back in 1 year.

## 2022-11-29 NOTE — Progress Notes (Unsigned)
Primary Care Physician:  Claretta Fraise, MD Primary Gastroenterologist:  Dr. Gala Romney  Pre-Procedure History & Physical: HPI:  Erica Beasley is a 67 y.o. female here for follow-up of iron deficiency and anemia of chronic disease.  Patient had a thorough GI workup for iron deficiency previously including colonoscopy/EGD/capsule study the small bowel.  No evidence of H. pylori or celiac disease.  She has always been Hemoccult negative.  She has major issues with active rheumatoid arthritis she is followed closely [facility] rheumatology.  He is also followed by otology at [facility].  She has no GI symptoms.  She denies reflux odynophagia, dysphagia early satiety nausea vomiting, no melena or hematochezia.  States she has never had reflux.  She continues on omeprazole 40 mg daily.  Her esophagus was normal previously.  No history of peptic ulcer disease.  Past Medical History:  Diagnosis Date   Aortic atherosclerosis ([place]) 11/13/2020   CKD (chronic kidney disease) stage 2, GFR 60-89 ml/min    COPD (chronic obstructive pulmonary disease) ([place])    COVID-19 06/2019   Depression    Essential hypertension    GERD (gastroesophageal reflux disease)    H/O Clostridium difficile infection 05/03/2019   History of diabetes mellitus    Hx of colonic polyps 05/03/2019   Hyperlipidemia    Hypothyroidism    Neuropathy    Rheumatoid arthritis ([place])    Stroke (cerebrum) ([place]) 07/09/2022   Stroke ([place]) 07/08/2022   Vitamin B12 deficiency    Vitamin D deficiency     Past Surgical History:  Procedure Laterality Date   BIOPSY  09/16/2019   Procedure: BIOPSY;  Surgeon: Daneil Dolin, MD;  [facility];  Service: Endoscopy;;   BIOPSY  04/28/2020   Procedure: BIOPSY;  Surgeon: Daneil Dolin, MD;  [facility];  Service: Endoscopy;;   BIOPSY  06/08/2020   Procedure: BIOPSY;  Surgeon: Daneil Dolin, MD;  [facility];  Service: Endoscopy;;   CATARACT EXTRACTION Bilateral     COLONOSCOPY  09/2010   Dr. Posey Pronto: mild diverticulsis in sigmoid colon.    COLONOSCOPY WITH PROPOFOL N/A 09/16/2019   Kalliope Riesen: Diverticulosis, random colon biopsies negative for microscopic colitis.   COLONOSCOPY WITH PROPOFOL N/A 06/08/2020   Procedure: COLONOSCOPY WITH PROPOFOL;  Surgeon: Daneil Dolin, MD;  [facility];  Service: Endoscopy;  Laterality: N/A;  9:15am   ESOPHAGOGASTRODUODENOSCOPY (EGD) WITH PROPOFOL N/A 04/28/2020   Procedure: ESOPHAGOGASTRODUODENOSCOPY (EGD) WITH PROPOFOL;  Surgeon: Daneil Dolin, MD;  [facility];  Service: Endoscopy;  Laterality: N/A;  2:15   GIVENS CAPSULE STUDY N/A 08/12/2022   Procedure: GIVENS CAPSULE STUDY;  Surgeon: Daneil Dolin, MD;  [facility];  Service: Endoscopy;  Laterality: N/A;   YAG LASER APPLICATION Left [id number]   Procedure: YAG LASER APPLICATION;  Surgeon: Williams Che, MD;  Location: [facility];  Service: Ophthalmology;  Laterality: Left;    Prior to Admission medications   Medication Sig Start Date End Date Taking? Authorizing Provider  albuterol (VENTOLIN HFA) 108 (90 Base) MCG/ACT inhaler Inhale 2 puffs into the lungs every 6 (six) hours as needed for wheezing or shortness of breath.   Yes [provider]  aspirin EC 81 MG tablet Take 1 tablet (81 mg total) by mouth daily. Swallow whole. 07/11/22  Yes Shahmehdi, Seyed A, MD  calcium carbonate (OSCAL) 1500 (600 Ca) MG TABS tablet Take 1,500 mg by mouth daily with breakfast.  Yes [provider]  citalopram (CELEXA) 40 MG tablet TAKE ONE TABLET ONCE DAILY 10/28/22  Yes Derek Jack, MD  COSENTYX SENSOREADY, 300 MG, 150 MG/ML SOAJ Inject into the skin. 09/03/22  Yes [provider]  diclofenac Sodium (VOLTAREN) 1 % GEL Apply 4 g topically 4 (four) times daily. 06/29/22  Yes Elba Barman, DO  folic acid (FOLVITE) 1 MG tablet TAKE ONE TABLET ONCE DAILY 11/28/22  Yes Derek Jack, MD  gabapentin (NEURONTIN) 300  MG capsule Take 1 capsule (300 mg total) by mouth 3 (three) times daily. 10/24/22  Yes Stacks, Cletus Gash, MD  hydroxychloroquine (PLAQUENIL) 200 MG tablet Take 200 mg by mouth daily.    Yes [provider]  leflunomide (ARAVA) 20 MG tablet 1 tablet Orally Once a day for RA for 30 days   Yes [provider]  levothyroxine (SYNTHROID) 125 MCG tablet Take 1 tablet (125 mcg total) by mouth every morning. 10/24/22  Yes Stacks, Cletus Gash, MD  liothyronine (CYTOMEL) 5 MCG tablet Take 2 tablets (10 mcg total) by mouth daily. 10/24/22  Yes Stacks, Cletus Gash, MD  megestrol (MEGACE) 40 MG/ML suspension TAKE 10 MLS BY MOUTH 2 TIMES A DAY 08/30/22  Yes Derek Jack, MD  metFORMIN (GLUCOPHAGE-XR) 500 MG 24 hr tablet Take 1 tablet (500 mg total) by mouth daily with breakfast. 10/31/22  Yes Claretta Fraise, MD  pantoprazole (PROTONIX) 40 MG tablet TAKE ONE TABLET BY MOUTH DAILY BEFORE BREAKFAST Patient taking differently: Take 40 mg by mouth daily. 06/29/22  Yes Gwenlyn Perking, FNP  potassium chloride SA (KLOR-CON M) 20 MEQ tablet TAKE ONE TABLET ONCE DAILY, NEED DOCTOR APPT. 09/27/22  Yes Derek Jack, MD  rosuvastatin (CRESTOR) 40 MG tablet Take 1 tablet (40 mg total) by mouth daily. For cholesterol 07/12/22  Yes Stacks, Cletus Gash, MD  traZODone (DESYREL) 100 MG tablet Take 1 tablet (100 mg total) by mouth at bedtime as needed. for sleep 10/24/22  Yes Claretta Fraise, MD    Allergies as of 11/29/2022 - Review Complete 11/29/2022  Allergen Reaction Noted   Adalimumab  12/08/2020   Methotrexate Other (See Comments) 10/04/2021   Sulfasalazine  11/29/2022   Upadacitinib Other (See Comments) 10/04/2021    Family History  Problem Relation Age of Onset   Stroke Mother    Heart disease Mother    Diabetes Mother    Breast cancer Mother    Other Father        stomach taken out for some reason   Migraines Daughter    Colon cancer Neg Hx     Social History   Socioeconomic History   Marital  status: Widowed    Spouse name: Not on file   Number of children: 1   Years of education: Not on file   Highest education level: Not on file  Occupational History   Occupation: DISABLED  Tobacco Use   Smoking status: Former    Packs/day: 1.00    Years: 15.00    Total pack years: 15.00    Types: Cigarettes    Quit date: 09/11/2017    Years since quitting: 5.2   Smokeless tobacco: Former  Scientific laboratory technician Use: Never used  Substance and Sexual Activity   Alcohol use: Never   Drug use: Never   Sexual activity: Not Currently  Other Topics Concern   Not on file  Social History Narrative   Not on file   Social Determinants of Health   Financial Resource Strain: Low Risk  (07/11/2022)  Overall Financial Resource Strain (CARDIA)    Difficulty of Paying Living Expenses: Not hard at all  Food Insecurity: No Food Insecurity (07/08/2022)   Hunger Vital Sign    Worried About Running Out of Food in the Last Year: Never true    Ran Out of Food in the Last Year: Never true  Transportation Needs: No Transportation Needs (07/11/2022)   PRAPARE - Hydrologist (Medical): No    Lack of Transportation (Non-Medical): No  Physical Activity: Insufficiently Active (09/03/2020)   Exercise Vital Sign    Days of Exercise per Week: 3 days    Minutes of Exercise per Session: 30 min  Stress: No Stress Concern Present (09/03/2020)   [place]    Feeling of Stress : Only a little  Social Connections: Socially Isolated (09/03/2020)   Social Connection and Isolation Panel [NHANES]    Frequency of Communication with Friends and Family: More than three times a week    Frequency of Social Gatherings with Friends and Family: More than three times a week    Attends Religious Services: Never    Marine scientist or Organizations: No    Attends Archivist Meetings: Never    Marital Status: Widowed   Intimate Partner Violence: Not At Risk (07/08/2022)   Humiliation, Afraid, Rape, and Kick questionnaire    Fear of Current or Ex-Partner: No    Emotionally Abused: No    Physically Abused: No    Sexually Abused: No    Review of Systems: See HPI, otherwise negative ROS  Physical Exam: BP (!) 144/81 (BP Location: Left Arm, Patient Position: Sitting, Cuff Size: Normal)   Pulse 67   Temp 98.2 F (36.8 C) (Oral)   Ht '5\' 4"'$  (1.626 m)   Wt 100 lb (45.4 kg)   SpO2 97%   BMI 17.16 kg/m  General:   Alert, somewhat frail-appearing lady pleasant and cooperative in NAD Abdomen: Non-distended, normal bowel sounds.  Soft and nontender without appreciable mass or hepatosplenomegaly.  Pulses:  Normal pulses noted. Extremities:  Without clubbing or edema.  Impression/Plan: Pleasant 67 year old lady with debilitating rheumatoid arthritis.  History of iron deficiency anemia likely functional in origin with his chronic inflammatory anemia.  She has had a thorough GI evaluation.  No history of documented GI bleeding. Followed closely by her PCP, hematology and rheumatology.  No further GI evaluation warranted at this time  She does no longer have an indication for pantoprazole.  I recommend this medication be stopped.    Plan to see this nice lady back in 1 year.  Plan for surveillance colonoscopy (history of colonic polyps) 2026.   Notice: This dictation was prepared with Dragon dictation along with smaller phrase technology. Any transcriptional errors that result from this process are unintentional and may not be corrected upon review.

## 2022-11-30 ENCOUNTER — Ambulatory Visit (INDEPENDENT_AMBULATORY_CARE_PROVIDER_SITE_OTHER): Payer: 59 | Admitting: Family Medicine

## 2022-11-30 ENCOUNTER — Encounter: Payer: Self-pay | Admitting: Family Medicine

## 2022-11-30 VITALS — BP 123/75 | HR 95 | Temp 97.6°F | Ht 64.0 in | Wt 101.8 lb

## 2022-11-30 DIAGNOSIS — E119 Type 2 diabetes mellitus without complications: Secondary | ICD-10-CM

## 2022-11-30 MED ORDER — BLOOD GLUCOSE MONITORING SUPPL DEVI: 1.0000 | Freq: Three times a day (TID) | 0 refills | Status: AC

## 2022-11-30 MED ORDER — LANCETS MISC. MISC: 1.0000 | Freq: Three times a day (TID) | 0 refills | Status: AC

## 2022-11-30 MED ORDER — BLOOD GLUCOSE TEST VI STRP: 1.0000 | ORAL_STRIP | Freq: Two times a day (BID) | 6 refills | Status: DC

## 2022-11-30 MED ORDER — LANCET DEVICE MISC: 1.0000 | Freq: Three times a day (TID) | 0 refills | Status: AC

## 2022-11-30 NOTE — Patient Instructions (Signed)
Diabetes Mellitus and Foot Care Diabetes, also called diabetes mellitus, may cause problems with your feet and legs because of poor blood flow (circulation). Poor circulation may make your skin: Become thinner and drier. Break more easily. Heal more slowly. Peel and crack. You may also have nerve damage (neuropathy). This can cause decreased feeling in your legs and feet. This means that you may not notice minor injuries to your feet that could lead to more serious problems. Finding and treating problems early is the best way to prevent future foot problems. How to care for your feet Foot hygiene  Wash your feet daily with warm water and mild soap. Do not use hot water. Then, pat your feet and the areas between your toes until they are fully dry. Do not soak your feet. This can dry your skin. Trim your toenails straight across. Do not dig under them or around the cuticle. File the edges of your nails with an emery board or nail file. Apply a moisturizing lotion or petroleum jelly to the skin on your feet and to dry, brittle toenails. Use lotion that does not contain alcohol and is unscented. Do not apply lotion between your toes. Shoes and socks Wear clean socks or stockings every day. Make sure they are not too tight. Do not wear knee-high stockings. These may decrease blood flow to your legs. Wear shoes that fit well and have enough cushioning. Always look in your shoes before you put them on to be sure there are no objects inside. To break in new shoes, wear them for just a few hours a day. This prevents injuries on your feet. Wounds, scrapes, corns, and calluses  Check your feet daily for blisters, cuts, bruises, sores, and redness. If you cannot see the bottom of your feet, use a mirror or ask someone for help. Do not cut off corns or calluses or try to remove them with medicine. If you find a minor scrape, cut, or break in the skin on your feet, keep it and the skin around it clean and  dry. You may clean these areas with mild soap and water. Do not clean the area with peroxide, alcohol, or iodine. If you have a wound, scrape, corn, or callus on your foot, look at it several times a day to make sure it is healing and not infected. Check for: Redness, swelling, or pain. Fluid or blood. Warmth. Pus or a bad smell. General tips Do not cross your legs. This may decrease blood flow to your feet. Do not use heating pads or hot water bottles on your feet. They may burn your skin. If you have lost feeling in your feet or legs, you may not know this is happening until it is too late. Protect your feet from hot and cold by wearing shoes, such as at the beach or on hot pavement. Schedule a complete foot exam at least once a year or more often if you have foot problems. Report any cuts, sores, or bruises to your health care provider right away. Where to find more information American Diabetes Association: diabetes.org Association of Diabetes Care & Education Specialists: diabeteseducator.org Contact a health care provider if: You have a condition that increases your risk of infection, and you have any cuts, sores, or bruises on your feet. You have an injury that is not healing. You have redness on your legs or feet. You feel burning or tingling in your legs or feet. You have pain or cramps in your legs   and feet. Your legs or feet are numb. Your feet always feel cold. You have pain around any toenails. Get help right away if: You have a wound, scrape, corn, or callus on your foot and: You have signs of infection. You have a fever. You have a red line going up your leg. This information is not intended to replace advice given to you by your health care provider. Make sure you discuss any questions you have with your health care provider. Document Revised: 03/16/2022 Document Reviewed: 03/16/2022 Elsevier Patient Education  [place]. Diabetes Mellitus and Exercise Regular  exercise is important for your health, especially if you have diabetes mellitus. Exercise is not just about losing weight. It can also help you increase muscle strength and bone density and reduce body fat and stress. This can help your level of endurance and make you more fit and flexible. Why should I exercise if I have diabetes? Exercise has many benefits for people with diabetes. It can: Help lower and control your blood sugar (glucose). Help your body respond better and become more sensitive to the hormone insulin. Reduce how much insulin your body needs. Lower your risk for heart disease by: Lowering how much "bad" cholesterol and triglycerides you have in your body. Increasing how much "good" cholesterol you have in your body. Lowering your blood pressure. Lowering your blood glucose levels. What is my activity plan? Your health care provider or an expert trained in diabetes care (certified diabetes educator) can help you make an activity plan. This plan can help you find the type of exercise that works for you. It may also tell you how often to exercise and for how long. Be sure to: Get at least 150 minutes of medium-intensity or high-intensity exercise each week. This may involve brisk walking, biking, or water aerobics. Do stretching and strengthening exercises at least 2 times a week. This may involve yoga or weight lifting. Spread out your activity over at least 3 days of the week. Get some form of physical activity each day. Do not go more than 2 days in a row without some kind of activity. Avoid being inactive for more than 30 minutes at a time. Take frequent breaks to walk or stretch. Choose activities that you enjoy. Set goals that you know you can accomplish. Start slowly and increase the intensity of your exercise over time. How do I manage my diabetes during exercise?  Monitor your blood glucose Check your blood glucose before and after you exercise. If your blood glucose  is 240 mg/dL (13.3 mmol/L) or higher before you exercise, check your urine for ketones. These are chemicals created by the liver. If you have ketones in your urine, do not exercise until your blood glucose returns to normal. If your blood glucose is 100 mg/dL (5.6 mmol/L) or lower, eat a snack that has 15-20 grams of carbohydrate in it. Check your blood glucose 15 minutes after the snack to make sure that your level is above 100 mg/dL (5.6 mmol/L) before you start to exercise. Your risk for low blood glucose (hypoglycemia) goes up during and after exercise. Know the symptoms of this condition and how to treat it. Follow these instructions at home: Keep a carbohydrate snack on hand for use before, during, and after exercise. This can help prevent or treat hypoglycemia. Avoid injecting insulin into parts of your body that are going to be used during exercise. This may include: Your arms, when you are going to play tennis. Your  legs, when you are about to go jogging. Keep track of your exercise habits. This can help you and your health care provider watch and adjust your activity plan. Write down: What you eat before and after you exercise. Blood glucose levels before and after you exercise. The type and amount of exercise you do. Talk to your health care provider before you start a new activity. They may need to: Make sure that the activity is safe for you. Adjust your insulin, other medicines, and food that you eat. Drink water while you exercise. This can stop you from losing too much water (dehydration). It can also prevent problems caused by having a lot of heat in your body (heat stroke). Where to find more information American Diabetes Association: diabetes.org Association of Diabetes Care & Education Specialists: diabeteseducator.org This information is not intended to replace advice given to you by your health care provider. Make sure you discuss any questions you have with your health care  provider. Document Revised: 03/02/2022 Document Reviewed: 03/02/2022 Elsevier Patient Education  [place] for Diabetes Mellitus, Adult Carbohydrate counting is a method of keeping track of how many carbohydrates you eat. Eating carbohydrates increases the amount of sugar (glucose) in the blood. Counting how many carbohydrates you eat improves how well you manage your blood glucose. This, in turn, helps you manage your diabetes. Carbohydrates are measured in grams (g) per serving. It is important to know how many carbohydrates (in grams or by serving size) you can have in each meal. This is different for every person. A dietitian can help you make a meal plan and calculate how many carbohydrates you should have at each meal and snack. What foods contain carbohydrates? Carbohydrates are found in the following foods: Grains, such as breads and cereals. Dried beans and soy products. Starchy vegetables, such as potatoes, peas, and corn. Fruit and fruit juices. Milk and yogurt. Sweets and snack foods, such as cake, cookies, candy, chips, and soft drinks. How do I count carbohydrates in foods? There are two ways to count carbohydrates in food. You can read food labels or learn standard serving sizes of foods. You can use either of these methods or a combination of both. Using the Nutrition Facts label The Nutrition Facts list is included on the labels of almost all packaged foods and beverages in the [place]. It includes: The serving size. Information about nutrients in each serving, including the grams of carbohydrate per serving. To use the Nutrition Facts, decide how many servings you will have. Then, multiply the number of servings by the number of carbohydrates per serving. The resulting number is the total grams of carbohydrates that you will be having. Learning the standard serving sizes of foods When you eat carbohydrate foods that are not packaged or do  not include Nutrition Facts on the label, you need to measure the servings in order to count the grams of carbohydrates. Measure the foods that you will eat with a food scale or measuring cup, if needed. Decide how many standard-size servings you will eat. Multiply the number of servings by 15. For foods that contain carbohydrates, one serving equals 15 g of carbohydrates. For example, if you eat 2 cups or 10 oz (300 g) of strawberries, you will have eaten 2 servings and 30 g of carbohydrates (2 servings x 15 g = 30 g). For foods that have more than one food mixed, such as soups and casseroles, you must count the carbohydrates in each  food that is included. The following list contains standard serving sizes of common carbohydrate-rich foods. Each of these servings has about 15 g of carbohydrates: 1 slice of bread. 1 six-inch (15 cm) tortilla. ? cup or 2 oz (53 g) cooked rice or pasta.  cup or 3 oz (85 g) cooked or canned, drained and rinsed beans or lentils.  cup or 3 oz (85 g) starchy vegetable, such as peas, corn, or squash.  cup or 4 oz (120 g) hot cereal.  cup or 3 oz (85 g) boiled or mashed potatoes, or  or 3 oz (85 g) of a large baked potato.  cup or 4 fl oz (118 mL) fruit juice. 1 cup or 8 fl oz (237 mL) milk. 1 small or 4 oz (106 g) apple.  or 2 oz (63 g) of a medium banana. 1 cup or 5 oz (150 g) strawberries. 3 cups or 1 oz (28.3 g) popped popcorn. What is an example of carbohydrate counting? To calculate the grams of carbohydrates in this sample meal, follow the steps shown below. Sample meal 3 oz (85 g) chicken breast. ? cup or 4 oz (106 g) brown rice.  cup or 3 oz (85 g) corn. 1 cup or 8 fl oz (237 mL) milk. 1 cup or 5 oz (150 g) strawberries with sugar-free whipped topping. Carbohydrate calculation Identify the foods that contain carbohydrates: Rice. Corn. Milk. Strawberries. Calculate how many servings you have of each food: 2 servings rice. 1 serving  corn. 1 serving milk. 1 serving strawberries. Multiply each number of servings by 15 g: 2 servings rice x 15 g = 30 g. 1 serving corn x 15 g = 15 g. 1 serving milk x 15 g = 15 g. 1 serving strawberries x 15 g = 15 g. Add together all of the amounts to find the total grams of carbohydrates eaten: 30 g + 15 g + 15 g + 15 g = 75 g of carbohydrates total. What are tips for following this plan? Shopping Develop a meal plan and then make a shopping list. Buy fresh and frozen vegetables, fresh and frozen fruit, dairy, eggs, beans, lentils, and whole grains. Look at food labels. Choose foods that have more fiber and less sugar. Avoid processed foods and foods with added sugars. Meal planning Aim to have the same number of grams of carbohydrates at each meal and for each snack time. Plan to have regular, balanced meals and snacks. Where to find more information American Diabetes Association: diabetes.org Centers for Disease Control and Prevention: [employer] Academy of Nutrition and Dietetics: eatright.org Association of Diabetes Care & Education Specialists: diabeteseducator.org Summary Carbohydrate counting is a method of keeping track of how many carbohydrates you eat. Eating carbohydrates increases the amount of sugar (glucose) in your blood. Counting how many carbohydrates you eat improves how well you manage your blood glucose. This helps you manage your diabetes. A dietitian can help you make a meal plan and calculate how many carbohydrates you should have at each meal and snack. This information is not intended to replace advice given to you by your health care provider. Make sure you discuss any questions you have with your health care provider. Document Revised: 04/15/2020 Document Reviewed: 04/15/2020 Elsevier Patient Education  [place].

## 2022-11-30 NOTE — Progress Notes (Signed)
Subjective:  Patient ID: Erica Beasley, female    DOB: [birth year]  Age: 67 y.o. MRN: [id number]  CC: Diabetes   HPI Erica Beasley presents forFollow-up of diabetes. Patient checks blood sugar at home.   100 fasting and 300 the one time she checked it postprandial Patient denies symptoms such as polyuria, polydipsia, excessive hunger, nausea No significant hypoglycemic spells noted. Medications reviewed. Pt reports taking them regularly without complication/adverse reaction being reported today.    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   She has a past surgical history that includes Yag laser application (Left, [id number]); Colonoscopy (09/2010); Colonoscopy with propofol (N/A, 09/16/2019); biopsy (09/16/2019); Esophagogastroduodenoscopy (egd) with propofol (N/A, 04/28/2020); biopsy (04/28/2020); Cataract extraction (Bilateral); Colonoscopy with propofol (N/A, 06/08/2020); biopsy (06/08/2020); and Givens capsule study (N/A, 08/12/2022).   Erica Beasley family history includes Breast cancer in Erica Beasley mother; Diabetes in Erica Beasley mother; Heart disease in Erica Beasley mother; Migraines in Erica Beasley daughter; Other in Erica Beasley father; Stroke in Erica Beasley mother.She reports that she quit smoking about 5 years ago. Erica Beasley smoking use included cigarettes. She has a 15.00 pack-year smoking history. She has quit using smokeless tobacco. She reports that she does not drink alcohol and does not use drugs.  Current  Outpatient Medications on File Prior to Visit  Medication Sig Dispense Refill   albuterol (VENTOLIN HFA) 108 (90 Base) MCG/ACT inhaler Inhale 2 puffs into the lungs every 6 (six) hours as needed for wheezing or shortness of breath.     aspirin EC 81 MG tablet Take 1 tablet (81 mg total) by mouth daily. Swallow whole. 30 tablet 12   calcium carbonate (OSCAL) 1500 (600 Ca) MG TABS tablet Take 1,500 mg by mouth daily with breakfast.     citalopram (CELEXA) 40 MG tablet TAKE ONE TABLET ONCE DAILY 30 tablet 5   COSENTYX SENSOREADY, 300 MG, 150 MG/ML SOAJ Inject into the skin.     diclofenac Sodium (VOLTAREN) 1 % GEL Apply 4 g topically 4 (four) times daily. 4 g 0   folic acid (FOLVITE) 1 MG tablet TAKE ONE TABLET ONCE DAILY 90 tablet 3   gabapentin (NEURONTIN) 300 MG capsule Take 1 capsule (300 mg total) by mouth 3 (three) times daily. 270 capsule 3   hydroxychloroquine (PLAQUENIL) 200 MG tablet Take 200 mg by mouth daily.      leflunomide (ARAVA) 20 MG tablet 1 tablet Orally Once a day for RA for 30 days     levothyroxine (SYNTHROID) 125 MCG tablet Take 1 tablet (125 mcg total) by mouth every morning. 90 tablet 3   liothyronine (CYTOMEL) 5 MCG tablet Take 2 tablets (10 mcg total) by mouth daily. 180 tablet 3   megestrol (MEGACE) 40 MG/ML suspension TAKE 10 MLS BY MOUTH 2 TIMES A DAY 480 mL 2   metFORMIN (GLUCOPHAGE-XR) 500 MG 24 hr tablet Take 1 tablet (500 mg total) by mouth daily with breakfast. 100 tablet 0   pantoprazole (PROTONIX) 40 MG tablet TAKE ONE TABLET BY MOUTH DAILY BEFORE BREAKFAST (Patient taking differently: Take 40 mg by mouth daily.) 90 tablet 1   potassium chloride SA (KLOR-CON M) 20 MEQ tablet TAKE  ONE TABLET ONCE DAILY, NEED DOCTOR APPT. 30 tablet 6   rosuvastatin (CRESTOR) 40 MG tablet Take 1 tablet (40 mg total) by mouth daily. For cholesterol 90 tablet 3   traZODone (DESYREL) 100 MG tablet Take 1 tablet (100 mg total) by mouth at bedtime as needed. for sleep 90 tablet 3   No  current facility-administered medications on file prior to visit.    ROS Review of Systems  Constitutional: Negative.   HENT: Negative.    Eyes:  Negative for visual disturbance.  Respiratory:  Negative for shortness of breath.   Cardiovascular:  Negative for chest pain.  Gastrointestinal:  Negative for abdominal pain.  Musculoskeletal:  Negative for arthralgias.    Objective:  BP 123/75   Pulse 95   Temp 97.6 F (36.4 C)   Ht '5\' 4"'$  (1.626 m)   Wt 101 lb 12.8 oz (46.2 kg)   SpO2 99%   BMI 17.47 kg/m   BP Readings from Last 3 Encounters:  11/30/22 123/75  11/29/22 (!) 150/87  10/24/22 136/77    Wt Readings from Last 3 Encounters:  11/30/22 101 lb 12.8 oz (46.2 kg)  11/29/22 100 lb (45.4 kg)  10/24/22 102 lb 6.4 oz (46.4 kg)     Physical Exam Constitutional:      General: She is not in acute distress.    Appearance: She is well-developed.  Cardiovascular:     Rate and Rhythm: Normal rate and regular rhythm.  Pulmonary:     Breath sounds: Normal breath sounds.  Musculoskeletal:        General: Normal range of motion.  Skin:    General: Skin is warm and dry.  Neurological:     Mental Status: She is alert and oriented to person, place, and time.       Assessment & Plan:   Erica Beasley was seen today for diabetes.  Diagnoses and all orders for this visit:  New onset type 2 diabetes mellitus ([place])  Other orders -     Blood Glucose Monitoring Suppl DEVI; 1 each by Does not apply route in the morning, at noon, and at bedtime. May substitute to any manufacturer covered by patient's insurance. -     Glucose Blood (BLOOD GLUCOSE TEST STRIPS) STRP; 1 each by In Vitro route 2 (two) times daily before a meal. May substitute to any manufacturer covered by patient's insurance. -     Lancet Device MISC; 1 each by Does not apply route in the morning, at noon, and at bedtime. May substitute to any manufacturer covered by patient's insurance. -     Lancets Misc. MISC; 1 each  by Does not apply route in the morning, at noon, and at bedtime. May substitute to any manufacturer covered by patient's insurance.      I am having Erica Beasley start on Blood Glucose Monitoring Suppl, BLOOD GLUCOSE TEST STRIPS, Lancet Device, and Lancets Misc.. I am also having Erica Beasley maintain Erica Beasley hydroxychloroquine, albuterol, calcium carbonate, pantoprazole, diclofenac Sodium, aspirin EC, rosuvastatin, leflunomide, megestrol, potassium chloride SA, Cosentyx Sensoready (300 MG), gabapentin, levothyroxine, liothyronine, traZODone, citalopram, metFORMIN, and folic acid.  Meds ordered this encounter  Medications   Blood Glucose Monitoring Suppl DEVI    Sig: 1 each by Does not apply route in the morning, at noon, and at bedtime. May substitute to any manufacturer covered by patient's insurance.    Dispense:  1 each    Refill:  0   Glucose Blood (BLOOD GLUCOSE TEST STRIPS) STRP  Sig: 1 each by In Vitro route 2 (two) times daily before a meal. May substitute to any manufacturer covered by patient's insurance.    Dispense:  100 strip    Refill:  6   Lancet Device MISC    Sig: 1 each by Does not apply route in the morning, at noon, and at bedtime. May substitute to any manufacturer covered by patient's insurance.    Dispense:  1 each    Refill:  0   Lancets Misc. MISC    Sig: 1 each by Does not apply route in the morning, at noon, and at bedtime. May substitute to any manufacturer covered by patient's insurance.    Dispense:  100 each    Refill:  0     Follow-up: Return in about 6 weeks (around 01/11/2023).  Claretta Fraise, M.D.

## 2022-12-08 ENCOUNTER — Telehealth: Payer: Self-pay | Admitting: Family Medicine

## 2022-12-08 ENCOUNTER — Inpatient Hospital Stay: Payer: 59 | Attending: Hematology

## 2022-12-08 DIAGNOSIS — R803 Bence Jones proteinuria: Secondary | ICD-10-CM

## 2022-12-08 DIAGNOSIS — D649 Anemia, unspecified: Secondary | ICD-10-CM | POA: Diagnosis not present

## 2022-12-08 DIAGNOSIS — D500 Iron deficiency anemia secondary to blood loss (chronic): Secondary | ICD-10-CM

## 2022-12-08 DIAGNOSIS — E538 Deficiency of other specified B group vitamins: Secondary | ICD-10-CM

## 2022-12-08 LAB — CBC WITH DIFFERENTIAL/PLATELET
Abs Immature Granulocytes: 0.02 10*3/uL (ref 0.00–0.07)
Basophils Absolute: 0 10*3/uL (ref 0.0–0.1)
Basophils Relative: 0 %
Eosinophils Absolute: 0 10*3/uL (ref 0.0–0.5)
Eosinophils Relative: 0 %
HCT: 27.8 % — ABNORMAL LOW (ref 36.0–46.0)
Hemoglobin: 8.4 g/dL — ABNORMAL LOW (ref 12.0–15.0)
Immature Granulocytes: 0 %
Lymphocytes Relative: 24 %
Lymphs Abs: 1.3 10*3/uL (ref 0.7–4.0)
MCH: 27.5 pg (ref 26.0–34.0)
MCHC: 30.2 g/dL (ref 30.0–36.0)
MCV: 90.8 fL (ref 80.0–100.0)
Monocytes Absolute: 0.4 10*3/uL (ref 0.1–1.0)
Monocytes Relative: 7 %
Neutro Abs: 3.7 10*3/uL (ref 1.7–7.7)
Neutrophils Relative %: 69 %
Platelets: 218 10*3/uL (ref 150–400)
RBC: 3.06 MIL/uL — ABNORMAL LOW (ref 3.87–5.11)
RDW: 16.4 % — ABNORMAL HIGH (ref 11.5–15.5)
WBC: 5.5 10*3/uL (ref 4.0–10.5)
nRBC: 0 % (ref 0.0–0.2)

## 2022-12-08 LAB — SAMPLE TO BLOOD BANK

## 2022-12-08 NOTE — Telephone Encounter (Signed)
NA/NVM pt should have refills on file at pharmacy. She will probably have to pay out of pocket to get early refill for partial refill till her next full refill is due.

## 2022-12-13 ENCOUNTER — Ambulatory Visit (INDEPENDENT_AMBULATORY_CARE_PROVIDER_SITE_OTHER): Payer: 59 | Admitting: Family Medicine

## 2022-12-13 ENCOUNTER — Encounter: Payer: Self-pay | Admitting: Family Medicine

## 2022-12-13 VITALS — BP 121/65 | HR 88 | Temp 97.7°F | Ht 64.0 in | Wt 98.4 lb

## 2022-12-13 DIAGNOSIS — Z0001 Encounter for general adult medical examination with abnormal findings: Secondary | ICD-10-CM

## 2022-12-13 DIAGNOSIS — F339 Major depressive disorder, recurrent, unspecified: Secondary | ICD-10-CM

## 2022-12-13 DIAGNOSIS — E119 Type 2 diabetes mellitus without complications: Secondary | ICD-10-CM | POA: Diagnosis not present

## 2022-12-13 DIAGNOSIS — D649 Anemia, unspecified: Secondary | ICD-10-CM | POA: Diagnosis not present

## 2022-12-13 MED ORDER — DESVENLAFAXINE SUCCINATE ER 50 MG PO TB24: 50.0000 mg | ORAL_TABLET | Freq: Every day | ORAL | 3 refills | Status: DC

## 2022-12-13 MED ORDER — CELECOXIB 200 MG PO CAPS: 200.0000 mg | ORAL_CAPSULE | Freq: Every day | ORAL | 5 refills | Status: DC

## 2022-12-13 NOTE — Progress Notes (Signed)
Annual Wellness Visit     Patient: Erica Beasley, Female    DOB: 1955/11/21, 67 y.o.   MRN: [id number]  Subjective  Chief Complaint  Patient presents with   WELCOME TO MEDICARE    Erica Beasley is a 67 y.o. female who presents today for her Annual Wellness Visit. She reports consuming a general diet. The patient does not participate in regular exercise at present. She generally feels fairly well. She reports sleeping poorly. She does have additional problems to discuss today. 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 Eating soft foods.  HPI  Vision:Within last year and Dental: No current dental problems   Patient Active Problem List   Diagnosis Date Noted   Hyperglycemia 10/24/2022   Synovitis of left knee 08/12/2022   Chronic bilateral low back pain without sciatica 06/29/2022   Chronic right SI joint pain 06/29/2022   Gait difficulty 06/29/2022   IDA (iron deficiency anemia) 06/07/2022   Symptomatic anemia 05/12/2022   Osteoporosis 01/17/2022   Hypothyroidism 11/13/2020   B12 deficiency 11/13/2020   COPD without exacerbation ([place]) 11/13/2020   Vitamin D deficiency 11/13/2020   Neuropathy 11/13/2020   Rheumatoid arthritis involving multiple sites ([place]) 11/13/2020   Mixed hyperlipidemia 11/13/2020   Aortic atherosclerosis ([place]) 11/13/2020   GERD (gastroesophageal reflux disease) 11/05/2020   Depression, recurrent ([place]) 09/22/2020   Monoclonal gammopathy 04/03/2020   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 03/26/2020   Normocytic anemia 05/03/2019   Chronic diarrhea 01/29/2019   Past Medical History:  Diagnosis Date   Aortic atherosclerosis ([place]) 11/13/2020   CKD (chronic kidney disease) stage 2, GFR 60-89 ml/min    COPD (chronic obstructive pulmonary disease) ([place])    COVID-19 06/2019   Depression    Essential hypertension    GERD (gastroesophageal reflux disease)    H/O Clostridium difficile infection 05/03/2019   History of diabetes mellitus    Hx of colonic polyps  05/03/2019   Hyperlipidemia    Hypothyroidism    Neuropathy    Rheumatoid arthritis ([place])    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 Posey Pronto: mild diverticulsis in sigmoid colon.    COLONOSCOPY WITH PROPOFOL N/A 09/16/2019   Rourk: Diverticulosis, random colon biopsies negative for microscopic colitis.   COLONOSCOPY WITH PROPOFOL N/A 06/08/2020   Procedure: COLONOSCOPY WITH PROPOFOL;  Surgeon: Daneil Dolin, MD;  [facility];  Service: Endoscopy;  Laterality: N/A;  9:15am   ESOPHAGOGASTRODUODENOSCOPY (EGD) WITH PROPOFOL N/A 04/28/2020   Procedure: ESOPHAGOGASTRODUODENOSCOPY (EGD) WITH PROPOFOL;  Surgeon: Daneil Dolin, MD;  [facility];  Service: Endoscopy;  Laterality: N/A;  2:15   GIVENS CAPSULE STUDY N/A 08/12/2022   Procedure: GIVENS CAPSULE STUDY;  Surgeon: Daneil Dolin, MD;  [facility];  Service: Endoscopy;  Laterality: N/A;   YAG LASER APPLICATION Left [id number]   Procedure: YAG LASER APPLICATION;  Surgeon:  Williams Che, MD;  Location: [facility];  Service: Ophthalmology;  Laterality: Left;   Social History   Tobacco Use   Smoking status: Former    Packs/day: 1.00    Years: 15.00    Additional pack years: 0.00    Total pack years: 15.00    Types: Cigarettes    Quit date: 09/11/2017    Years since quitting: 5.2   Smokeless tobacco: Former  Scientific laboratory technician Use: Never used  Substance Use  Topics   Alcohol use: Never   Drug use: Never      Medications: Outpatient Medications Prior to Visit  Medication Sig   albuterol (VENTOLIN HFA) 108 (90 Base) MCG/ACT inhaler Inhale 2 puffs into the lungs every 6 (six) hours as needed for wheezing or shortness of breath.   aspirin EC 81 MG tablet Take 1 tablet (81 mg total) by mouth daily. Swallow whole.   Blood Glucose Monitoring Suppl DEVI 1 each by Does not apply route in the morning, at noon, and at bedtime. May substitute to any manufacturer covered by patient's insurance.   calcium carbonate (OSCAL) 1500 (600 Ca) MG TABS tablet Take 1,500 mg by mouth daily with breakfast.   COSENTYX SENSOREADY, 300 MG, 150 MG/ML SOAJ Inject into the skin.   diclofenac Sodium (VOLTAREN) 1 % GEL Apply 4 g topically 4 (four) times daily.   folic acid (FOLVITE) 1 MG tablet TAKE ONE TABLET ONCE DAILY   gabapentin (NEURONTIN) 300 MG capsule Take 1 capsule (300 mg total) by mouth 3 (three) times daily.   Glucose Blood (BLOOD GLUCOSE TEST STRIPS) STRP 1 each by In Vitro route 2 (two) times daily before a meal. May substitute to any manufacturer covered by patient's insurance.   hydroxychloroquine (PLAQUENIL) 200 MG tablet Take 200 mg by mouth daily.    Lancet Device MISC 1 each by Does not apply route in the morning, at noon, and at bedtime. May substitute to any manufacturer covered by patient's insurance.   Lancets Misc. MISC 1 each by Does not apply route in the morning, at noon, and at bedtime. May substitute to any manufacturer covered by patient's insurance.   leflunomide (ARAVA) 20 MG tablet 1 tablet Orally Once a day for RA for 30 days   levothyroxine (SYNTHROID) 125 MCG tablet Take 1 tablet (125 mcg total) by mouth every morning.   liothyronine (CYTOMEL) 5 MCG tablet Take 2 tablets (10 mcg total) by mouth daily.   megestrol (MEGACE) 40 MG/ML suspension TAKE 10 MLS BY MOUTH 2 TIMES A DAY   metFORMIN (GLUCOPHAGE-XR) 500 MG 24 hr tablet Take 1 tablet  (500 mg total) by mouth daily with breakfast.   pantoprazole (PROTONIX) 40 MG tablet TAKE ONE TABLET BY MOUTH DAILY BEFORE BREAKFAST (Patient taking differently: Take 40 mg by mouth daily.)   potassium chloride SA (KLOR-CON M) 20 MEQ tablet TAKE ONE TABLET ONCE DAILY, NEED DOCTOR APPT.   rosuvastatin (CRESTOR) 40 MG tablet Take 1 tablet (40 mg total) by mouth daily. For cholesterol   traZODone (DESYREL) 100 MG tablet Take 1 tablet (100 mg total) by mouth at bedtime as needed. for sleep   [DISCONTINUED] citalopram (CELEXA) 40 MG tablet TAKE ONE TABLET ONCE DAILY   No facility-administered medications prior to visit.    Allergies  Allergen Reactions   Adalimumab     Other reaction(s): no response Pt is unaware of this Other reaction(s): no response   Methotrexate Other (See Comments)    Other reaction(s):  GI side effects   Sulfasalazine     Other Reaction(s): Anemia   Upadacitinib Other (See Comments)    Other reaction(s): GI side effects    Patient Care Team: Claretta Fraise, MD as PCP - General (Family Medicine) Gala Romney, Cristopher Estimable, MD as Consulting Physician (Gastroenterology) Liana Gerold, MD as Consulting Physician (Nephrology) Jacquelin Hawking, NP as Nurse Practitioner (Oncology) [facility] as Consulting Physician (Rheumatology)  ROS      Objective  BP 121/65   Pulse 88   Temp 97.7 F (36.5 C)   Ht 5\' 4"  (1.626 m)   Wt 98 lb 6.4 oz (44.6 kg)   SpO2 96%   BMI 16.89 kg/m  BP Readings from Last 3 Encounters:  12/13/22 121/65  11/30/22 123/75  11/29/22 (!) 150/87   Wt Readings from Last 3 Encounters:  12/13/22 98 lb 6.4 oz (44.6 kg)  11/30/22 101 lb 12.8 oz (46.2 kg)  11/29/22 100 lb (45.4 kg)   SpO2 Readings from Last 3 Encounters:  12/13/22 96%  11/30/22 99%  11/29/22 97%      Physical Exam    Most recent functional status assessment:    12/13/2022    2:13 PM  In your present state of health, do you have any difficulty performing  the following activities:  Hearing? 0  Vision? 0  Difficulty concentrating or making decisions? 0  Walking or climbing stairs? 1  Comment knee back and hip pain  Dressing or bathing? 1  Comment sometimes has to have help, doesnt bathe if she is alone in house  Doing errands, shopping? 0  Preparing Food and eating ? N  Using the Toilet? N  In the past six months, have you accidently leaked urine? N  Do you have problems with loss of bowel control? N  Managing your Medications? N  Managing your Finances? N  Housekeeping or managing your Housekeeping? N   Most recent fall risk assessment:    12/13/2022    2:12 PM  Fall Risk   Falls in the past year? 0    Most recent depression screenings:    12/13/2022    2:13 PM 11/30/2022    1:34 PM  PHQ 2/9 Scores  PHQ - 2 Score 5 0  PHQ- 9 Score 14    Most recent cognitive screening:    12/13/2022    2:16 PM  6CIT Screen  What Year? 0 points  What month? 0 points  What time? 3 points  Count back from 20 0 points  Months in reverse 0 points  Repeat phrase 0 points  Total Score 3 points   Most recent Audit-C alcohol use screening    12/13/2022    2:20 PM  Alcohol Use Disorder Test (AUDIT)  1. How often do you have a drink containing alcohol? 0  2. How many drinks containing alcohol do you have on a typical day when you are drinking? 0  3. How often do you have six or more drinks on one occasion? 0  AUDIT-C Score 0   A score of 3 or more in women, and 4 or more in men indicates increased risk for alcohol abuse, EXCEPT if all of the points are from question 1            Assessment & Plan   Annual wellness visit done today including the all of the following: Reviewed patient's Family Medical History Reviewed and updated list of patient's medical providers Assessment of cognitive impairment was done  Assessed patient's functional ability Established a written schedule for health screening [place]  Completed and Reviewed  Exercise Activities and Dietary recommendations  Goals      CCM Expected Outcome:  Monitor, Self-Manage and Reduce Symptoms of Diabetes     Have 3 meals a day     Increase physical activity     Follow diabtic diet 1200 calories        Immunization History  Administered Date(s) Administered   Fluad Quad(high Dose 65+) 07/10/2022   Influenza,inj,Quad PF,6+ Mos 07/10/2020, 06/21/2021   Moderna Sars-Covid-2 Vaccination 01/08/2020   PNEUMOCOCCAL CONJUGATE-20 08/06/2021   Td 04/20/2022   Tdap 09/27/2011   Zoster Recombinat (Shingrix) 04/20/2022, 08/25/2022    Health Maintenance  Topic Date Due   Diabetic kidney evaluation - Urine ACR  09/28/2022   Diabetic kidney evaluation - eGFR measurement  10/25/2023   Medicare Annual Wellness (AWV)  12/13/2023   DEXA SCAN  01/06/2024   MAMMOGRAM  09/05/2024   COLONOSCOPY (Pts 45-28yrs Insurance coverage will need to be confirmed)  06/08/2025   PAP SMEAR-Modifier  02/16/2026   DTaP/Tdap/Td (3 - Td or Tdap) 04/20/2032   Pneumonia Vaccine 3+ Years old  Completed   INFLUENZA VACCINE  Completed   Hepatitis C Screening  Completed   HIV Screening  Completed   Zoster Vaccines- Shingrix  Completed   HPV VACCINES  Aged Out   COVID-19 Vaccine  Discontinued     Discussed health benefits of physical activity, and encouraged her to engage in regular exercise appropriate for her age and condition.    Problem List Items Addressed This Visit       Active Problems   Depression, recurrent ([place]) - Primary   Relevant Medications   desvenlafaxine (PRISTIQ) 50 MG 24 hr tablet   Normocytic anemia   Other Visit Diagnoses     New onset type 2 diabetes mellitus ([place])           Return in about 6 weeks (around 01/24/2023).     Claretta Fraise, MD

## 2022-12-13 NOTE — Patient Instructions (Signed)

## 2022-12-22 ENCOUNTER — Encounter: Payer: 59 | Admitting: Family Medicine

## 2022-12-22 NOTE — Progress Notes (Signed)
Waited on video 15 min X 2. Phone call 4 times. No connection made. Erroneous encounter

## 2022-12-28 ENCOUNTER — Other Ambulatory Visit: Payer: Self-pay | Admitting: *Deleted

## 2022-12-28 DIAGNOSIS — D649 Anemia, unspecified: Secondary | ICD-10-CM

## 2022-12-28 NOTE — Telephone Encounter (Signed)
Would you like for her to come in for symptom management visit?

## 2022-12-29 ENCOUNTER — Inpatient Hospital Stay: Payer: 59 | Attending: Hematology | Admitting: Physician Assistant

## 2022-12-29 ENCOUNTER — Other Ambulatory Visit (HOSPITAL_COMMUNITY)
Admission: RE | Admit: 2022-12-29 | Discharge: 2022-12-29 | Disposition: A | Discharge status: Home / Self Care | Payer: 59 | Source: Ambulatory Visit | Attending: Physician Assistant | Admitting: Physician Assistant

## 2022-12-29 ENCOUNTER — Ambulatory Visit: Payer: 59

## 2022-12-29 ENCOUNTER — Encounter: Payer: 59 | Admitting: Family Medicine

## 2022-12-29 ENCOUNTER — Encounter: Payer: Self-pay | Admitting: Hematology

## 2022-12-29 ENCOUNTER — Telehealth: Payer: Self-pay

## 2022-12-29 DIAGNOSIS — R627 Adult failure to thrive: Secondary | ICD-10-CM | POA: Diagnosis not present

## 2022-12-29 DIAGNOSIS — Z803 Family history of malignant neoplasm of breast: Secondary | ICD-10-CM | POA: Diagnosis not present

## 2022-12-29 DIAGNOSIS — D649 Anemia, unspecified: Secondary | ICD-10-CM | POA: Diagnosis not present

## 2022-12-29 DIAGNOSIS — M810 Age-related osteoporosis without current pathological fracture: Secondary | ICD-10-CM | POA: Diagnosis not present

## 2022-12-29 DIAGNOSIS — R803 Bence Jones proteinuria: Secondary | ICD-10-CM | POA: Diagnosis not present

## 2022-12-29 DIAGNOSIS — R6251 Failure to thrive (child): Secondary | ICD-10-CM | POA: Insufficient documentation

## 2022-12-29 DIAGNOSIS — R634 Abnormal weight loss: Secondary | ICD-10-CM | POA: Diagnosis not present

## 2022-12-29 DIAGNOSIS — Z87891 Personal history of nicotine dependence: Secondary | ICD-10-CM | POA: Diagnosis not present

## 2022-12-29 DIAGNOSIS — M069 Rheumatoid arthritis, unspecified: Secondary | ICD-10-CM | POA: Diagnosis not present

## 2022-12-29 LAB — COMPREHENSIVE METABOLIC PANEL
ALT: 7 U/L (ref 0–44)
AST: 11 U/L — ABNORMAL LOW (ref 15–41)
Albumin: 2.5 g/dL — ABNORMAL LOW (ref 3.5–5.0)
Alkaline Phosphatase: 81 U/L (ref 38–126)
Anion gap: 6 (ref 5–15)
BUN: 16 mg/dL (ref 8–23)
CO2: 22 mmol/L (ref 22–32)
Calcium: 7.9 mg/dL — ABNORMAL LOW (ref 8.9–10.3)
Chloride: 104 mmol/L (ref 98–111)
Creatinine, Ser: 0.84 mg/dL (ref 0.44–1.00)
GFR, Estimated: >60 mL/min (ref >60)
Glucose, Bld: 193 mg/dL — ABNORMAL HIGH (ref 70–99)
Potassium: 3.9 mmol/L (ref 3.5–5.1)
Sodium: 132 mmol/L — ABNORMAL LOW (ref 135–145)
Total Bilirubin: 0.3 mg/dL (ref 0.3–1.2)
Total Protein: 5.7 g/dL — ABNORMAL LOW (ref 6.5–8.1)

## 2022-12-29 LAB — CBC WITH DIFFERENTIAL/PLATELET
Abs Immature Granulocytes: 0.04 10*3/uL (ref 0.00–0.07)
Basophils Absolute: 0 10*3/uL (ref 0.0–0.1)
Basophils Relative: 0 %
Eosinophils Absolute: 0 10*3/uL (ref 0.0–0.5)
Eosinophils Relative: 0 %
HCT: 24 % — ABNORMAL LOW (ref 36.0–46.0)
Hemoglobin: 7 g/dL — ABNORMAL LOW (ref 12.0–15.0)
Immature Granulocytes: 1 %
Lymphocytes Relative: 34 %
Lymphs Abs: 1.7 10*3/uL (ref 0.7–4.0)
MCH: 26.2 pg (ref 26.0–34.0)
MCHC: 29.2 g/dL — ABNORMAL LOW (ref 30.0–36.0)
MCV: 89.9 fL (ref 80.0–100.0)
Monocytes Absolute: 0.5 10*3/uL (ref 0.1–1.0)
Monocytes Relative: 9 %
Neutro Abs: 2.7 10*3/uL (ref 1.7–7.7)
Neutrophils Relative %: 56 %
Platelets: 317 10*3/uL (ref 150–400)
RBC: 2.67 MIL/uL — ABNORMAL LOW (ref 3.87–5.11)
RDW: 15.9 % — ABNORMAL HIGH (ref 11.5–15.5)
WBC: 4.9 10*3/uL (ref 4.0–10.5)
nRBC: 0 % (ref 0.0–0.2)

## 2022-12-29 LAB — FERRITIN: Ferritin: 1557 ng/mL — ABNORMAL HIGH (ref 11–307)

## 2022-12-29 LAB — IRON AND TIBC
Iron: 153 ug/dL (ref 28–170)
Saturation Ratios: 84 % — ABNORMAL HIGH (ref 10.4–31.8)
TIBC: 182 ug/dL — ABNORMAL LOW (ref 250–450)
UIBC: 29 ug/dL

## 2022-12-29 LAB — PREPARE RBC (CROSSMATCH)

## 2022-12-29 NOTE — Telephone Encounter (Signed)
Spoke with the patient's daughter for blood transfusion for tomorrow 2 units PRBC.  Lab orders entered and blood bank aware.  Patient's daughter instructed the patient needs to return for the blood bank sample and blue bracelet with understanding verbalized.

## 2022-12-30 ENCOUNTER — Inpatient Hospital Stay: Payer: 59

## 2022-12-30 DIAGNOSIS — M810 Age-related osteoporosis without current pathological fracture: Secondary | ICD-10-CM | POA: Diagnosis not present

## 2022-12-30 DIAGNOSIS — R627 Adult failure to thrive: Secondary | ICD-10-CM | POA: Diagnosis not present

## 2022-12-30 DIAGNOSIS — R803 Bence Jones proteinuria: Secondary | ICD-10-CM | POA: Diagnosis not present

## 2022-12-30 DIAGNOSIS — D649 Anemia, unspecified: Secondary | ICD-10-CM

## 2022-12-30 DIAGNOSIS — Z87891 Personal history of nicotine dependence: Secondary | ICD-10-CM | POA: Diagnosis not present

## 2022-12-30 DIAGNOSIS — Z803 Family history of malignant neoplasm of breast: Secondary | ICD-10-CM | POA: Diagnosis not present

## 2022-12-30 DIAGNOSIS — M069 Rheumatoid arthritis, unspecified: Secondary | ICD-10-CM | POA: Diagnosis not present

## 2022-12-30 DIAGNOSIS — R634 Abnormal weight loss: Secondary | ICD-10-CM | POA: Diagnosis not present

## 2022-12-30 MED ORDER — DIPHENHYDRAMINE HCL 25 MG PO CAPS
25.0000 mg | ORAL_CAPSULE | Freq: Once | ORAL | Status: AC
  Administered 2022-12-30: 25 mg via ORAL
  Filled 2022-12-30: qty 1

## 2022-12-30 MED ORDER — SODIUM CHLORIDE 0.9% IV SOLUTION
250.0000 mL | Freq: Once | INTRAVENOUS | Status: AC
  Administered 2022-12-30: 250 mL via INTRAVENOUS

## 2022-12-30 MED ORDER — ACETAMINOPHEN 325 MG PO TABS
650.0000 mg | ORAL_TABLET | Freq: Once | ORAL | Status: AC
  Administered 2022-12-30: 650 mg via ORAL
  Filled 2022-12-30: qty 2

## 2022-12-30 NOTE — Progress Notes (Signed)
Patient presents today for 2 units of PRBCs, per provider orders.  Patient tolerated blood transfusion with no complaints voiced. Peripheral IV site clean and dry with good blood return noted before and after infusion. Band aid applied. VSS with discharge and left in satisfactory condition with no s/s of distress noted.

## 2022-12-30 NOTE — Patient Instructions (Signed)
[facility]  Discharge Instructions: Thank you for choosing [facility] to provide your oncology and hematology care.  If you have a lab appointment with the Cancer Center - please note that after April 8th, 2024, all labs will be drawn in the cancer center.  You do not have to check in or register with the main entrance as you have in the past but will complete your check-in in the cancer center.  Wear comfortable clothing and clothing appropriate for easy access to any Portacath or PICC line.   We strive to give you quality time with your provider. You may need to reschedule your appointment if you arrive late (15 or more minutes).  Arriving late affects you and other patients whose appointments are after yours.  Also, if you miss three or more appointments without notifying the office, you may be dismissed from the clinic at the provider's discretion.      For prescription refill requests, have your pharmacy contact our office and allow 72 hours for refills to be completed.    Today you received the following 2 units of blood per provider orders.    To help prevent nausea and vomiting after your treatment, we encourage you to take your nausea medication as directed.  BELOW ARE SYMPTOMS THAT SHOULD BE REPORTED IMMEDIATELY: *FEVER GREATER THAN 100.4 F (38 C) OR HIGHER *CHILLS OR SWEATING *NAUSEA AND VOMITING THAT IS NOT CONTROLLED WITH YOUR NAUSEA MEDICATION *UNUSUAL SHORTNESS OF BREATH *UNUSUAL BRUISING OR BLEEDING *URINARY PROBLEMS (pain or burning when urinating, or frequent urination) *BOWEL PROBLEMS (unusual diarrhea, constipation, pain near the anus) TENDERNESS IN MOUTH AND THROAT WITH OR WITHOUT PRESENCE OF ULCERS (sore throat, sores in mouth, or a toothache) UNUSUAL RASH, SWELLING OR PAIN  UNUSUAL VAGINAL DISCHARGE OR ITCHING   Items with * indicate a potential emergency and should be followed up as soon as possible or go to the Emergency  Department if any problems should occur.  Please show the CHEMOTHERAPY ALERT CARD or IMMUNOTHERAPY ALERT CARD at check-in to the Emergency Department and triage nurse.  Should you have questions after your visit or need to cancel or reschedule your appointment, please contact [facility] [phone]  and follow the prompts.  Office hours are 8:00 a.m. to 4:30 p.m. Monday - Friday. Please note that voicemails left after 4:00 p.m. may not be returned until the following business day.  We are closed weekends and major holidays. You have access to a nurse at all times for urgent questions. Please call the main number to the clinic [phone] and follow the prompts.  For any non-urgent questions, you may also contact your provider using MyChart. We now offer e-Visits for anyone 45 and older to request care online for non-urgent symptoms. For details visit [email or web address].   Also download the MyChart app! Go to the app store, search "MyChart", open the app, select [place], and log in with your MyChart username and password.

## 2022-12-30 NOTE — Progress Notes (Signed)
Brief discussion with patient during PRBC transfusion.  She denies any recent blood loss.  Reports increased fatigue.  She continues to take Plaquenil and leflunomide for her rheumatoid arthritis.  She is scheduled for repeat labs on 01/12/2023.  Will recheck BB sample as well in case she needs additional transfusion at that time.  I will plan to reach out to her rheumatologist to discuss medication effects on anemia after we have had our full visit later this month (sees me on 01/17/2023).  Carnella Gu[place]aj[place], PA-C 12/30/22 12:05 PM

## 2023-01-01 LAB — BPAM RBC
Blood Product Expiration Date: 202405102359
Blood Product Expiration Date: 202405102359
ISSUE DATE / TIME: 202404050958
ISSUE DATE / TIME: 202404051130
Unit Type and Rh: 5100
Unit Type and Rh: 5100

## 2023-01-01 LAB — TYPE AND SCREEN
ABO/RH(D): O POS
Antibody Screen: NEGATIVE
Unit division: 0
Unit division: 0

## 2023-01-02 ENCOUNTER — Encounter: Payer: Self-pay | Admitting: Hematology

## 2023-01-04 ENCOUNTER — Emergency Department (HOSPITAL_COMMUNITY): Payer: 59

## 2023-01-04 ENCOUNTER — Other Ambulatory Visit: Payer: Self-pay

## 2023-01-04 ENCOUNTER — Emergency Department (HOSPITAL_COMMUNITY)
Admission: EM | Admit: 2023-01-04 | Discharge: 2023-01-04 | Disposition: A | Discharge status: Home / Self Care | Payer: 59 | Attending: Emergency Medicine | Admitting: Emergency Medicine

## 2023-01-04 ENCOUNTER — Encounter (HOSPITAL_COMMUNITY): Payer: Self-pay | Admitting: Emergency Medicine

## 2023-01-04 DIAGNOSIS — G4730 Sleep apnea, unspecified: Secondary | ICD-10-CM | POA: Diagnosis not present

## 2023-01-04 DIAGNOSIS — I110 Hypertensive heart disease with heart failure: Secondary | ICD-10-CM | POA: Diagnosis not present

## 2023-01-04 DIAGNOSIS — Z7982 Long term (current) use of aspirin: Secondary | ICD-10-CM | POA: Insufficient documentation

## 2023-01-04 DIAGNOSIS — Z5181 Encounter for therapeutic drug level monitoring: Secondary | ICD-10-CM | POA: Diagnosis not present

## 2023-01-04 DIAGNOSIS — W19XXXA Unspecified fall, initial encounter: Secondary | ICD-10-CM | POA: Diagnosis not present

## 2023-01-04 DIAGNOSIS — R9431 Abnormal electrocardiogram [ECG] [EKG]: Secondary | ICD-10-CM | POA: Diagnosis not present

## 2023-01-04 DIAGNOSIS — M109 Gout, unspecified: Secondary | ICD-10-CM | POA: Diagnosis not present

## 2023-01-04 DIAGNOSIS — R42 Dizziness and giddiness: Secondary | ICD-10-CM | POA: Diagnosis not present

## 2023-01-04 DIAGNOSIS — R55 Syncope and collapse: Secondary | ICD-10-CM | POA: Insufficient documentation

## 2023-01-04 DIAGNOSIS — M1990 Unspecified osteoarthritis, unspecified site: Secondary | ICD-10-CM | POA: Diagnosis not present

## 2023-01-04 DIAGNOSIS — M4802 Spinal stenosis, cervical region: Secondary | ICD-10-CM | POA: Diagnosis not present

## 2023-01-04 DIAGNOSIS — I509 Heart failure, unspecified: Secondary | ICD-10-CM | POA: Diagnosis not present

## 2023-01-04 DIAGNOSIS — Z79899 Other long term (current) drug therapy: Secondary | ICD-10-CM | POA: Diagnosis not present

## 2023-01-04 DIAGNOSIS — R112 Nausea with vomiting, unspecified: Secondary | ICD-10-CM | POA: Diagnosis not present

## 2023-01-04 DIAGNOSIS — R29818 Other symptoms and signs involving the nervous system: Secondary | ICD-10-CM | POA: Diagnosis not present

## 2023-01-04 DIAGNOSIS — R0602 Shortness of breath: Secondary | ICD-10-CM | POA: Diagnosis not present

## 2023-01-04 LAB — COMPREHENSIVE METABOLIC PANEL
ALT: 8 U/L (ref 0–44)
AST: 12 U/L — ABNORMAL LOW (ref 15–41)
Albumin: 2.7 g/dL — ABNORMAL LOW (ref 3.5–5.0)
Alkaline Phosphatase: 85 U/L (ref 38–126)
Anion gap: 9 (ref 5–15)
BUN: 8 mg/dL (ref 8–23)
CO2: 22 mmol/L (ref 22–32)
Calcium: 8.6 mg/dL — ABNORMAL LOW (ref 8.9–10.3)
Chloride: 104 mmol/L (ref 98–111)
Creatinine, Ser: 0.69 mg/dL (ref 0.44–1.00)
GFR, Estimated: >60 mL/min (ref >60)
Glucose, Bld: 112 mg/dL — ABNORMAL HIGH (ref 70–99)
Potassium: 3.5 mmol/L (ref 3.5–5.1)
Sodium: 135 mmol/L (ref 135–145)
Total Bilirubin: 0.4 mg/dL (ref 0.3–1.2)
Total Protein: 6.2 g/dL — ABNORMAL LOW (ref 6.5–8.1)

## 2023-01-04 LAB — CBC WITH DIFFERENTIAL/PLATELET
Abs Immature Granulocytes: 0.04 10*3/uL (ref 0.00–0.07)
Basophils Absolute: 0 10*3/uL (ref 0.0–0.1)
Basophils Relative: 0 %
Eosinophils Absolute: 0 10*3/uL (ref 0.0–0.5)
Eosinophils Relative: 0 %
HCT: 37.8 % (ref 36.0–46.0)
Hemoglobin: 11.4 g/dL — ABNORMAL LOW (ref 12.0–15.0)
Immature Granulocytes: 1 %
Lymphocytes Relative: 22 %
Lymphs Abs: 1.6 10*3/uL (ref 0.7–4.0)
MCH: 26.9 pg (ref 26.0–34.0)
MCHC: 30.2 g/dL (ref 30.0–36.0)
MCV: 89.2 fL (ref 80.0–100.0)
Monocytes Absolute: 0.7 10*3/uL (ref 0.1–1.0)
Monocytes Relative: 10 %
Neutro Abs: 4.8 10*3/uL (ref 1.7–7.7)
Neutrophils Relative %: 67 %
Platelets: 230 10*3/uL (ref 150–400)
RBC: 4.24 MIL/uL (ref 3.87–5.11)
RDW: 15.8 % — ABNORMAL HIGH (ref 11.5–15.5)
WBC: 7.2 10*3/uL (ref 4.0–10.5)
nRBC: 0 % (ref 0.0–0.2)

## 2023-01-04 LAB — TROPONIN I (HIGH SENSITIVITY)
Troponin I (High Sensitivity): 7 ng/L (ref <18)
Troponin I (High Sensitivity): 11 ng/L (ref <18)

## 2023-01-04 LAB — CK: Total CK: 106 U/L (ref 38–234)

## 2023-01-04 LAB — D-DIMER, QUANTITATIVE: D-Dimer, Quant: >20 ug/mL-FEU — ABNORMAL HIGH (ref 0.00–0.50)

## 2023-01-04 MED ORDER — SODIUM CHLORIDE 0.9 % IV BOLUS
1000.0000 mL | Freq: Once | INTRAVENOUS | Status: AC
  Administered 2023-01-04: 1000 mL via INTRAVENOUS

## 2023-01-04 MED ORDER — IOHEXOL 350 MG/ML SOLN
75.0000 mL | Freq: Once | INTRAVENOUS | Status: AC | PRN
  Administered 2023-01-04: 75 mL via INTRAVENOUS

## 2023-01-04 NOTE — ED Provider Notes (Signed)
[place] EMERGENCY DEPARTMENT AT [facility] Provider Note   CSN: [id number] Arrival date & time: 01/04/23  1614     History  Chief Complaint  Patient presents with   Loss of Consciousness    Erica Beasley is a 67 y.o. female.  Patient had a syncopal episode at the store.  There was some question about seizure activity.  Patient has a history of a previous stroke  The history is provided by the patient and a relative.  Loss of Consciousness Episode history:  Single Most recent episode:  Today Timing:  Rare Progression:  Resolved Chronicity:  New Context: not blood draw   Witnessed: yes   Relieved by:  Nothing Worsened by:  Nothing Associated symptoms: no chest pain, no headaches and no seizures        Home Medications Prior to Admission medications   Medication Sig Start Date End Date Taking? Authorizing Provider  albuterol (VENTOLIN HFA) 108 (90 Base) MCG/ACT inhaler Inhale 2 puffs into the lungs every 6 (six) hours as needed for wheezing or shortness of breath.   Yes [provider]  aspirin EC 81 MG tablet Take 1 tablet (81 mg total) by mouth daily. Swallow whole. 07/11/22  Yes Shahmehdi, Gemma Payor, MD  calcium carbonate (OSCAL) 1500 (600 Ca) MG TABS tablet Take 1,500 mg by mouth 2 (two) times daily with a meal.   Yes [provider]  celecoxib (CELEBREX) 200 MG capsule Take 1 capsule (200 mg total) by mouth daily. With food 12/13/22  Yes Stacks, Broadus John, MD  COSENTYX SENSOREADY, 300 MG, 150 MG/ML SOAJ Inject 150 mg into the skin every 30 (thirty) days. 09/03/22  Yes [provider]  desvenlafaxine (PRISTIQ) 50 MG 24 hr tablet Take 1 tablet (50 mg total) by mouth daily. 12/13/22  Yes Stacks, Broadus John, MD  diclofenac Sodium (VOLTAREN) 1 % GEL Apply 4 g topically 4 (four) times daily. 06/29/22  Yes Madelyn Brunner, DO  folic acid (FOLVITE) 1 MG tablet TAKE ONE TABLET ONCE DAILY 11/28/22  Yes Doreatha Massed, MD  gabapentin (NEURONTIN) 300  MG capsule Take 1 capsule (300 mg total) by mouth 3 (three) times daily. 10/24/22  Yes Stacks, Broadus John, MD  hydroxychloroquine (PLAQUENIL) 200 MG tablet Take 200 mg by mouth 2 (two) times daily.   Yes [provider]  leflunomide (ARAVA) 20 MG tablet 1 tablet Orally Once a day for RA for 30 days   Yes [provider]  levothyroxine (SYNTHROID) 125 MCG tablet Take 1 tablet (125 mcg total) by mouth every morning. 10/24/22  Yes Stacks, Broadus John, MD  liothyronine (CYTOMEL) 5 MCG tablet Take 2 tablets (10 mcg total) by mouth daily. 10/24/22  Yes Stacks, Broadus John, MD  megestrol (MEGACE) 40 MG/ML suspension TAKE 10 MLS BY MOUTH 2 TIMES A DAY Patient taking differently: Take 10 mLs by mouth 2 (two) times daily. 08/30/22  Yes Doreatha Massed, MD  metFORMIN (GLUCOPHAGE-XR) 500 MG 24 hr tablet Take 1 tablet (500 mg total) by mouth daily with breakfast. 10/31/22  Yes Mechele Claude, MD  potassium chloride SA (KLOR-CON M) 20 MEQ tablet TAKE ONE TABLET ONCE DAILY, NEED DOCTOR APPT. Patient taking differently: Take 20 mEq by mouth daily. 09/27/22  Yes Doreatha Massed, MD  rosuvastatin (CRESTOR) 40 MG tablet Take 1 tablet (40 mg total) by mouth daily. For cholesterol 07/12/22  Yes Stacks, Broadus John, MD  traZODone (DESYREL) 100 MG tablet Take 1 tablet (100 mg total) by mouth at bedtime as needed. for sleep 10/24/22  Yes Mechele ClaudeStacks, Warren, MD  Blood Glucose Monitoring Suppl DEVI 1 each by Does not apply route in the morning, at noon, and at bedtime. May substitute to any manufacturer covered by patient's insurance. 11/30/22   Mechele ClaudeStacks, Warren, MD  Glucose Blood (BLOOD GLUCOSE TEST STRIPS) STRP 1 each by In Vitro route 2 (two) times daily before a meal. May substitute to any manufacturer covered by patient's insurance. 11/30/22 11/25/23  Mechele ClaudeStacks, Warren, MD      Allergies    Adalimumab, Methotrexate, Sulfasalazine, and Upadacitinib    Review of Systems   Review of Systems  Constitutional:  Negative for appetite  change and fatigue.  HENT:  Negative for congestion, ear discharge and sinus pressure.   Eyes:  Negative for discharge.  Respiratory:  Negative for cough.   Cardiovascular:  Positive for syncope. Negative for chest pain.  Gastrointestinal:  Negative for abdominal pain and diarrhea.  Genitourinary:  Negative for frequency and hematuria.  Musculoskeletal:  Negative for back pain.  Skin:  Negative for rash.  Neurological:  Positive for syncope. Negative for seizures and headaches.  Psychiatric/Behavioral:  Negative for hallucinations.     Physical Exam Updated Vital Signs BP (!) 143/79   Pulse 71   Temp 98.8 F (37.1 C) (Oral)   Resp (!) 22   Ht 5\' 4"  (1.626 m)   Wt 46.3 kg   SpO2 98%   BMI 17.51 kg/m  Physical Exam Vitals and nursing note reviewed.  Constitutional:      Appearance: She is well-developed.  HENT:     Head: Normocephalic.     Nose: Nose normal.  Eyes:     General: No scleral icterus.    Conjunctiva/sclera: Conjunctivae normal.  Neck:     Thyroid: No thyromegaly.  Cardiovascular:     Rate and Rhythm: Normal rate and regular rhythm.     Heart sounds: No murmur heard.    No friction rub. No gallop.  Pulmonary:     Breath sounds: No stridor. No wheezing or rales.  Chest:     Chest wall: No tenderness.  Abdominal:     General: There is no distension.     Tenderness: There is no abdominal tenderness. There is no rebound.  Musculoskeletal:        General: Normal range of motion.     Cervical back: Neck supple.  Lymphadenopathy:     Cervical: No cervical adenopathy.  Skin:    Findings: No erythema or rash.  Neurological:     Mental Status: She is alert and oriented to person, place, and time.     Motor: No abnormal muscle tone.     Coordination: Coordination normal.  Psychiatric:        Behavior: Behavior normal.     ED Results / Procedures / Treatments   Labs (all labs ordered are listed, but only abnormal results are displayed) Labs Reviewed   CBC WITH DIFFERENTIAL/PLATELET - Abnormal; Notable for the following components:      Result Value   Hemoglobin 11.4 (*)    RDW 15.8 (*)    All other components within normal limits  COMPREHENSIVE METABOLIC PANEL - Abnormal; Notable for the following components:   Glucose, Bld 112 (*)    Calcium 8.6 (*)    Total Protein 6.2 (*)    Albumin 2.7 (*)    AST 12 (*)    All other components within normal limits  D-DIMER, QUANTITATIVE - Abnormal; Notable for the following components:   D-Dimer, Quant >  20.00 (*)    All other components within normal limits  CK  TROPONIN I (HIGH SENSITIVITY)  TROPONIN I (HIGH SENSITIVITY)    EKG None  Radiology CT Angio Chest PE W and/or Wo Contrast  Result Date: 01/04/2023 CLINICAL DATA:  Syncope/presyncope EXAM: CT ANGIOGRAPHY CHEST WITH CONTRAST TECHNIQUE: Multidetector CT imaging of the chest was performed using the standard protocol during bolus administration of intravenous contrast. Multiplanar CT image reconstructions and MIPs were obtained to evaluate the vascular anatomy. RADIATION DOSE REDUCTION: This exam was performed according to the departmental dose-optimization program which includes automated exposure control, adjustment of the mA and/or kV according to patient size and/or use of iterative reconstruction technique. CONTRAST:  21mL OMNIPAQUE IOHEXOL 350 MG/ML SOLN COMPARISON:  CT chest abdomen and pelvis 05/11/2020 FINDINGS: Cardiovascular: Satisfactory opacification of the pulmonary arteries to the segmental level. No evidence of pulmonary embolism. Normal heart size. No pericardial effusion. There are atherosclerotic calcifications of the aorta. Mediastinum/Nodes: Thyroid gland not visualized. No enlarged mediastinal, hilar or axillary lymph nodes. Visualized esophagus is within normal limits. Lungs/Pleura: Lungs are clear. No pleural effusion or pneumothorax. Previously identified right upper lobe nodule is no longer seen. Upper Abdomen: No  acute abnormality. Musculoskeletal: Bones are osteopenic. Degenerative changes affect the shoulders and spine. No acute fractures. Review of the MIP images confirms the above findings. IMPRESSION: 1. No evidence for pulmonary embolism. 2. No acute cardiopulmonary process. 3. Previously identified right upper lobe nodule is no longer seen. Aortic Atherosclerosis (ICD10-I70.0). Electronically Signed   By: Darliss Cheney M.D.   On: 01/04/2023 19:31   MR BRAIN WO CONTRAST  Result Date: 01/04/2023 CLINICAL DATA:  Initial evaluation for neuro deficit, stroke. EXAM: MRI HEAD WITHOUT CONTRAST TECHNIQUE: Multiplanar, multiecho pulse sequences of the brain and surrounding structures were obtained without intravenous contrast. COMPARISON:  Prior study from earlier the same day. FINDINGS: Brain: Cerebral volume within normal limits for age. Minimal hazy FLAIR signal abnormality noted involving the periventricular white matter, less than is typically seen for age. Small remote lacunar infarct at the right basal ganglia. No evidence for acute or subacute ischemia. Gray-white matter differentiation maintained. No areas of chronic cortical infarction. No acute or chronic intracranial blood products. No mass lesion, midline shift or mass effect. No hydrocephalus or extra-axial fluid collection. Pituitary gland and suprasellar region within normal limits. Vascular: Major intracranial vascular flow voids are maintained. Skull and upper cervical spine: Craniocervical junction normal. Bone marrow signal intensity within normal limits. No scalp soft tissue abnormality. Sinuses/Orbits: Prior bilateral ocular lens replacement. Paranasal sinuses are largely clear. No significant mastoid effusion. Other: None. IMPRESSION: 1. No acute intracranial abnormality. 2. Small remote lacunar infarct at the right basal ganglia. Electronically Signed   By: Rise Mu M.D.   On: 01/04/2023 19:18   CT Head Wo Contrast  Result Date:  01/04/2023 CLINICAL DATA:  Head and neck trauma. Patient was at grocery shopping and felt dizzy and fell striking head. EXAM: CT HEAD WITHOUT CONTRAST CT CERVICAL SPINE WITHOUT CONTRAST TECHNIQUE: Multidetector CT imaging of the head and cervical spine was performed following the standard protocol without intravenous contrast. Multiplanar CT image reconstructions of the cervical spine were also generated. RADIATION DOSE REDUCTION: This exam was performed according to the departmental dose-optimization program which includes automated exposure control, adjustment of the mA and/or kV according to patient size and/or use of iterative reconstruction technique. COMPARISON:  MR head examination dated July 08, 2022. FINDINGS: CT HEAD FINDINGS Brain: No evidence of acute infarction,  hemorrhage, hydrocephalus, extra-axial collection or mass lesion/mass effect. Focal decreased attenuation of the right basal ganglia/posterior capsular region likely sequela of recent small infarct. Vascular: No hyperdense vessel or unexpected calcification. Skull: Normal. Negative for fracture or focal lesion. Sinuses/Orbits: No acute finding.  Bilateral cataract surgery. Other: None. CT CERVICAL SPINE FINDINGS Alignment: Straightening of the cervical spine. Skull base and vertebrae: No acute fracture. No primary bone lesion or focal pathologic process. Soft tissues and spinal canal: No prevertebral fluid or swelling. No visible canal hematoma. Disc levels: C2-C3:  No significant findings. C3-C4: Left facet joint arthropathy and mild left neural foraminal stenosis. C4-C5: Uncovertebral joint arthropathy with mild bilateral neural foraminal stenosis. C5-C6: Uncovertebral joint arthropathy with moderate bilateral neural foraminal stenosis. C6-C7: Disc height loss and uncovertebral joint arthropathy. Mild right and moderate left neural foraminal stenosis. C7-T1:  No significant findings. Upper chest: Centrilobular emphysematous changes. Other:  None IMPRESSION: CT head: 1. No acute intracranial abnormality. 2. Focal decreased attenuation of the right basal ganglia/posterior capsular region, likely sequela of recent small infarct. CT cervical spine: 1. No evidence of fracture or subluxation of the cervical spine. 2. 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 3. Emphysema (ICD10-J43.9). Electronically Signed   By: Larose Hires D.O.   On: 01/04/2023 17:11   CT Cervical Spine Wo Contrast  Result Date: 01/04/2023 CLINICAL DATA:  Head and neck trauma. Patient was at grocery shopping and felt dizzy and fell striking head. EXAM: CT HEAD WITHOUT CONTRAST CT CERVICAL SPINE WITHOUT CONTRAST TECHNIQUE: Multidetector CT imaging of the head and cervical spine was performed following the standard protocol without intravenous contrast. Multiplanar CT image reconstructions of the cervical spine were also generated. RADIATION DOSE REDUCTION: This exam was performed according to the departmental dose-optimization program which includes automated exposure control, adjustment of the mA and/or kV according to patient size and/or use of iterative reconstruction technique. COMPARISON:  MR head examination dated July 08, 2022. FINDINGS: CT HEAD FINDINGS Brain: No evidence of acute infarction, hemorrhage, hydrocephalus, extra-axial collection or mass lesion/mass effect. Focal decreased attenuation of the right basal ganglia/posterior capsular region likely sequela of recent small infarct. Vascular: No hyperdense vessel or unexpected calcification. Skull: Normal. Negative for fracture or focal lesion. Sinuses/Orbits: No acute finding.  Bilateral cataract surgery. Other: None. CT CERVICAL SPINE FINDINGS Alignment: Straightening of the cervical spine. Skull base and vertebrae: No acute fracture. No primary bone lesion or focal pathologic process. Soft tissues and spinal canal: No prevertebral  fluid or swelling. No visible canal hematoma. Disc levels: C2-C3:  No significant findings. C3-C4: Left facet joint arthropathy and mild left neural foraminal stenosis. C4-C5: Uncovertebral joint arthropathy with mild bilateral neural foraminal stenosis. C5-C6: Uncovertebral joint arthropathy with moderate bilateral neural foraminal stenosis. C6-C7: Disc height loss and uncovertebral joint arthropathy. Mild right and moderate left neural foraminal stenosis. C7-T1:  No significant findings. Upper chest: Centrilobular emphysematous changes. Other: None IMPRESSION: CT head: 1. No acute intracranial abnormality. 2. Focal decreased attenuation of the right basal ganglia/posterior capsular region, likely sequela of recent small infarct. CT cervical spine: 1. No evidence of fracture or subluxation of the cervical spine. 2. 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 3. Emphysema (ICD10-J43.9). Electronically Signed   By: Larose Hires D.O.   On: 01/04/2023 17:11   DG Chest Port 1 View  Result Date: 01/04/2023 CLINICAL DATA:  Shortness of breath  EXAM: PORTABLE CHEST 1 VIEW COMPARISON:  CT chest abdomen and pelvis 05/11/2020 FINDINGS: Scattered chronic appearing interstitial opacities are unchanged within both lungs. There is no new focal lung infiltrate. Costophrenic angles are clear. No pneumothorax. The cardiomediastinal silhouette is within normal limits. Degenerative changes affect the spine and shoulders. IMPRESSION: 1. No acute cardiopulmonary process identified. 2. Chronic appearing interstitial opacities are unchanged. Electronically Signed   By: Darliss Cheney M.D.   On: 01/04/2023 16:52    Procedures Procedures    Medications Ordered in ED Medications  sodium chloride 0.9 % bolus 1,000 mL (0 mLs Intravenous Stopped 01/04/23 1827)  iohexol (OMNIPAQUE) 350 MG/ML injection 75 mL (75 mLs Intravenous Contrast Given 01/04/23  1915)    ED Course/ Medical Decision Making/ A&P                             Medical Decision Making Amount and/or Complexity of Data Reviewed Labs: ordered. Radiology: ordered.  Risk Prescription drug management.  This patient presents to the ED for concern of syncope, this involves an extensive number of treatment options, and is a complaint that carries with it a high risk of complications and morbidity.  The differential diagnosis includes stroke, vasovagal response, seizure   Co morbidities that complicate the patient evaluation  Stroke   Additional history obtained:  Additional history obtained from patient and family External records from outside source obtained and reviewed including hospital record   Lab Tests:  I Ordered, and personally interpreted labs.  The pertinent results include: CBC and chemistries unremarkable, D-dimer greater than 20   Imaging Studies ordered:  I ordered imaging studies including CT chest and head I independently visualized and interpreted imaging which showed negative for acute disease I agree with the radiologist interpretation   Cardiac Monitoring: / EKG:  The patient was maintained on a cardiac monitor.  I personally viewed and interpreted the cardiac monitored which showed an underlying rhythm of: Normal sinus rhythm   Consultations Obtained:  No consultant  Problem List / ED Course / Critical interventions / Medication management  Syncope and history of stroke I ordered medication including normal saline for dehydration Reevaluation of the patient after these medicines showed that the patient improved I have reviewed the patients home medicines and have made adjustments as needed   Social Determinants of Health:  None   Test / Admission - Considered:  None  Patient with syncopal episode.  Labs and CT chest and head are unremarkable except for old stroke.  Patient is not orthostatic.  She will be discharged  home and will follow-up with her PCP and her neurologist        Final Clinical Impression(s) / ED Diagnoses Final diagnoses:  Syncope and collapse    Rx / DC Orders ED Discharge Orders     None         Bethann Berkshire, MD 01/06/23 1211

## 2023-01-04 NOTE — Discharge Instructions (Signed)
Rest at home for 1 to 2 days.  Do not drive until your doctor says it is okay.  Follow-up with your family doctor next week and follow-up with your neurologist in the next couple weeks

## 2023-01-04 NOTE — ED Notes (Signed)
Introduced myself to patient. Daughter at bedside. Patient is back from MRI.

## 2023-01-04 NOTE — ED Triage Notes (Signed)
Pt states she was grocery shopping and felt dizzy. Pt fell striking head, has knot to the back of head. Staff states she had seizure like activity for about one minute. Pt states she felt dizzy before passing out.

## 2023-01-09 ENCOUNTER — Telehealth: Payer: Self-pay

## 2023-01-09 NOTE — Telephone Encounter (Signed)
     Patient  visit on 01/04/2023  at [facility] was for Loss of Consciousness. Declined to participate.  Have you been able to follow up with your primary care physician?  The patient was or was not able to obtain any needed medicine or equipment.  Are there diet recommendations that you are having difficulty following?  Patient expresses understanding of discharge instructions and education provided has no other needs at this time.    Cochise Dinneen [facility]  [facility] Community Resource Care Guide   [email or web address]  ?? [phone]   Website: [email or web address]  [email or web address]

## 2023-01-12 ENCOUNTER — Inpatient Hospital Stay: Payer: 59 | Admitting: Physician Assistant

## 2023-01-12 ENCOUNTER — Inpatient Hospital Stay: Payer: 59

## 2023-01-12 ENCOUNTER — Ambulatory Visit: Payer: 59 | Admitting: Family Medicine

## 2023-01-12 DIAGNOSIS — R803 Bence Jones proteinuria: Secondary | ICD-10-CM

## 2023-01-12 DIAGNOSIS — R627 Adult failure to thrive: Secondary | ICD-10-CM | POA: Diagnosis not present

## 2023-01-12 DIAGNOSIS — Z803 Family history of malignant neoplasm of breast: Secondary | ICD-10-CM | POA: Diagnosis not present

## 2023-01-12 DIAGNOSIS — M810 Age-related osteoporosis without current pathological fracture: Secondary | ICD-10-CM | POA: Diagnosis not present

## 2023-01-12 DIAGNOSIS — D649 Anemia, unspecified: Secondary | ICD-10-CM | POA: Diagnosis not present

## 2023-01-12 DIAGNOSIS — R634 Abnormal weight loss: Secondary | ICD-10-CM | POA: Diagnosis not present

## 2023-01-12 DIAGNOSIS — M069 Rheumatoid arthritis, unspecified: Secondary | ICD-10-CM | POA: Diagnosis not present

## 2023-01-12 DIAGNOSIS — Z87891 Personal history of nicotine dependence: Secondary | ICD-10-CM | POA: Diagnosis not present

## 2023-01-12 LAB — CBC WITH DIFFERENTIAL/PLATELET
Abs Immature Granulocytes: 0.02 10*3/uL (ref 0.00–0.07)
Basophils Absolute: 0 10*3/uL (ref 0.0–0.1)
Basophils Relative: 0 %
Eosinophils Absolute: 0 10*3/uL (ref 0.0–0.5)
Eosinophils Relative: 1 %
HCT: 34 % — ABNORMAL LOW (ref 36.0–46.0)
Hemoglobin: 10.3 g/dL — ABNORMAL LOW (ref 12.0–15.0)
Immature Granulocytes: 0 %
Lymphocytes Relative: 23 %
Lymphs Abs: 1.4 10*3/uL (ref 0.7–4.0)
MCH: 26.9 pg (ref 26.0–34.0)
MCHC: 30.3 g/dL (ref 30.0–36.0)
MCV: 88.8 fL (ref 80.0–100.0)
Monocytes Absolute: 0.5 10*3/uL (ref 0.1–1.0)
Monocytes Relative: 9 %
Neutro Abs: 4 10*3/uL (ref 1.7–7.7)
Neutrophils Relative %: 67 %
Platelets: 257 10*3/uL (ref 150–400)
RBC: 3.83 MIL/uL — ABNORMAL LOW (ref 3.87–5.11)
RDW: 15.5 % (ref 11.5–15.5)
WBC: 6 10*3/uL (ref 4.0–10.5)
nRBC: 0 % (ref 0.0–0.2)

## 2023-01-12 LAB — SAMPLE TO BLOOD BANK

## 2023-01-17 ENCOUNTER — Inpatient Hospital Stay (HOSPITAL_BASED_OUTPATIENT_CLINIC_OR_DEPARTMENT_OTHER): Payer: 59 | Admitting: Physician Assistant

## 2023-01-17 DIAGNOSIS — M069 Rheumatoid arthritis, unspecified: Secondary | ICD-10-CM | POA: Diagnosis not present

## 2023-01-17 DIAGNOSIS — M810 Age-related osteoporosis without current pathological fracture: Secondary | ICD-10-CM | POA: Diagnosis not present

## 2023-01-17 DIAGNOSIS — D649 Anemia, unspecified: Secondary | ICD-10-CM

## 2023-01-17 DIAGNOSIS — R627 Adult failure to thrive: Secondary | ICD-10-CM | POA: Diagnosis not present

## 2023-01-17 DIAGNOSIS — R803 Bence Jones proteinuria: Secondary | ICD-10-CM | POA: Diagnosis not present

## 2023-01-17 DIAGNOSIS — Z87891 Personal history of nicotine dependence: Secondary | ICD-10-CM | POA: Diagnosis not present

## 2023-01-17 DIAGNOSIS — Z803 Family history of malignant neoplasm of breast: Secondary | ICD-10-CM | POA: Diagnosis not present

## 2023-01-17 DIAGNOSIS — R634 Abnormal weight loss: Secondary | ICD-10-CM | POA: Diagnosis not present

## 2023-01-17 NOTE — Patient Instructions (Signed)
[facility] **VISIT SUMMARY & IMPORTANT INSTRUCTIONS **   You were seen today by Rojelio Brenner PA-C for your anemia.    ANEMIA: Your anemia is suspected to be due to chronic inflammation as well as side effect of your rheumatoid arthritis medications. You do not need any iron supplement at this time. We will continue to check your blood counts every 2 weeks. We will refer you to our nutritionist for your low appetite and weight loss. We will see you for follow-up visit in 3 months.  ** Thank you for trusting me with your healthcare!  I strive to provide all of my patients with quality care at each visit.  If you receive a survey for this visit, I would be so grateful to you for taking the time to provide feedback.  Thank you in advance!  ~ Wymon Swaney                   Dr. Doreatha Massed   &   Rojelio Brenner, PA-C   - - - - - - - - - - - - - - - - - -    Thank you for choosing  Cancer Center at [facility] to provide your oncology and hematology care.  To afford each patient quality time with our provider, please arrive at least 15 minutes before your scheduled appointment time.   If you have a lab appointment with the Cancer Center please come in thru the Main Entrance and check in at the main information desk.  You need to re-schedule your appointment should you arrive 10 or more minutes late.  We strive to give you quality time with our providers, and arriving late affects you and other patients whose appointments are after yours.  Also, if you no show three or more times for appointments you may be dismissed from the clinic at the providers discretion.     Again, thank you for choosing [facility].  Our hope is that these requests will decrease the amount of time that you wait before being seen by our physicians.       _____________________________________________________________  Should you have questions  after your visit to [facility], please contact our office at [phone] and follow the prompts.  Our office hours are 8:00 a.m. and 4:30 p.m. Monday - Friday.  Please note that voicemails left after 4:00 p.m. may not be returned until the following business day.  We are closed weekends and major holidays.  You do have access to a nurse 24-7, just call the main number to the clinic [phone] and do not press any options, hold on the line and a nurse will answer the phone.    For prescription refill requests, have your pharmacy contact our office and allow 72 hours.

## 2023-01-17 NOTE — Progress Notes (Unsigned)
[facility] [address], [place] [address]   CLINIC:  Medical Oncology/Hematology  PCP:  Mechele Claude, MD [address] [place] [address] [phone]   REASON FOR VISIT:  Follow-up for normocytic anemia  INTERVAL HISTORY:   [name] 67 y.o. female returns for routine follow-up of her normocytic anemia.  She received Feraheme on 05/18/2022 and 06/17/2022.  She was last seen by Rojelio Brenner PA-C on 10/11/2022.  She received PRBC x 2 on 12/30/2022 due to Hgb 7.0.  She reports feeling poorly at today's visit.  She has severe ongoing fatigue.  She has poor appetite, and her daughter Artist Pais) reports that she "barely eats" at home.  No improvement in appetite after starting Megace.  She is not drinking her Ensure or Boost.  She does not drink water and will primarily drink [facility] when she is thirsty.  Suspect that this may have been the root cause behind her syncopal episode and ED visit on 01/04/2023.  She continues to follow with rheumatology and take leflunomide, hydroxychloroquine, and Cosentyx.   She continues to deny any bright red blood per rectum, melena, epistaxis, or other signs of blood loss.  She has ongoing dyspnea on exertion, but does not use her inhaler at home as instructed.   She denies any pica, restless legs, headaches, lightheadedness, syncope, or chest pain.  No B symptoms such as fever, chills, night sweats, unintentional weight loss.  She has little to no energy and 50% appetite.  Her weight is stable since January 2024, but her BMI remains low at 17.51.  ASSESSMENT & PLAN:  1.  Normocytic anemia -  BMBX on 07/19/2021: Mildly hypercellular marrow with trilineage hematopoiesis.  No increased ring sideroblasts or blasts.  3% polytypic plasma cells.  No large aggregates of plasma cells.  Iron stores increased.  Chromosome analysis normal. - Etiology of patient's anemia remains uncertain.   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 Myelosuppression from rheumatology medications (leflunomide, hydroxychloroquine, and previously sulfasalazine) is a significant possibility.   Patient has been NEGATIVE for Hemoccult stool on multiple occasions within the past year.  No obvious rectal bleeding or melena.  EGD, colonoscopy, capsule studies did not reveal any source of major blood loss. Bone marrow biopsy was nondiagnostic.  Hemolysis labs and nutritional deficiency work-up have been unremarkable, apart from significant functional iron deficiency (no improvement in hemoglobin after IV iron) No evidence of chronic kidney disease based on patient's creatinine or Cystatin C - She is taking vitamin B12 tablets at home. - Trial of monthly Feraheme x2 doses did NOT improve her hemoglobin.  Iron tablets discontinued due to elevated iron levels. - She has required intermittent blood transfusions, most recently on 12/30/2022 (Hgb 7.0) - Reports worsening fatigue - No bright red blood per rectum or melena\ - Most recent labs (12/29/2022): Normal creatinine 0.84 Ferritin 157 iron saturation 84% - CBC/differential (01/12/2023): Hgb 10.3/MCV 88.8.  Normal WBC and platelets. - PLAN: No indication for oral iron at this time. - Continue vitamin B12 supplement. - Every 2 weeks CBC with blood bank sample - Patient educated on alarm symptoms that would prompt immediate medical attention/ED visit. - Labs and office visit in 3 months - Will reach out to rheumatology to discuss myelosuppressive effects of her RA medications.  2.  Bence-Jones proteinuria -24-hour urine done by Dr. Wolfgang Phoenix showed urine immunofixation positive for Bence-Jones proteinuria, kappa  type.  24-hour total protein was 77 mg/day. -Free kappa light chains were 52.9, lambda light chains 29.8 with ratio of 1.78.  Serum immunofixation was unremarkable.  SPEP did not show any evidence of M spike. -Labs  on 04/03/2020 shows negative immunofixation.  Kappa light chain 61.3, lambda light chains 32.3, ratio 1.9. -Skeletal survey on 04/03/2020 was negative for lytic lesions. - Most recent MGUS/myeloma panel (10/04/2022) 24-hour urine/UPEP/UIFE/TP (08/29/2022): Total protein 261 mg/24-hr.  Urine immunofixation normal, M spike not observed.  Mildly elevated urine kappa light chains 140, with normal lambda normal ratio. Serum immunofixation unremarkable, no monoclonal protein SPEP negative for M spike. Mildly elevated serum kappa free light chain 36.4, normal lambda 21.3, mildly elevated ratio 1.71. - No new onset bone pain or neurologic changes  - PLAN: No evidence of plasma cell dyscrasia in blood or urine at this time.  We will recheck labs and urine studies in 1 year (January 2025)  3.  Failure to thrive with intermittent weight loss / BMI 17.51 -Overall feeling poorly with fatigue and general malaise. - Patient admits poor appetite, daughter reports that patient "barely eats" at home - No improvement in appetite after starting Megace. - Patient is not drinking her Ensure or Boost. - She does not drink water and will primarily drink [facility] when she is thirsty.  (Suspect that this may have been the root cause behind her syncopal episode and ED visit on 01/04/2023.) - Weight is stable since January 2024, but her BMI remains low at 17.51. - EGD on 04/28/2020 with stomach and duodenal biopsy negative. - CT CAP on 05/11/2020 showed long segment colonic thickening likely diverticular disease.  Stable small juxtapleural nodule in the right upper chest benign over 1 year.  Moderate large fat-containing left inguinal hernia.  Emphysema and diverticular calcification. - Colonoscopy on 06/08/2020 shows pancolonic diverticulosis, mild.  Normal-appearing colonic mucosa otherwise.  Random biopsies were negative. - PLAN: Will refer patient to nutritionist for additional insights.  4.  Rheumatoid arthritis - Follows  with [facility] - PLAN: Currently taking Cosentyx, hydroxychloroquine, leflunomide  5.  Osteoporosis - DEXA scan on 01/05/2022 with T score -2.7. - Recommended Prolia but her insurance did not cover it. - She received Zometa 4 mg on 02/23/2022. - PLAN: Next Zometa due around 02/23/2023.  Continue calcium supplements. -- Check calcium and Vitamin D with next appotinment  PLAN SUMMARY: >> Please refer to nutritionist >> CBC/D with BB sample every 2 weeks >> Due for Zometa in May 2024 >> Labs in 3 months = CBC/D, CMP, B12, MMA, LDH, ferritin, iron/TIBC >> OFFICE visit in 3 months (1 week after labs)    REVIEW OF SYSTEMS:  Review of Systems  Constitutional:  Positive for appetite change and fatigue. Negative for chills, diaphoresis, fever and unexpected weight change.  HENT:   Negative for lump/mass and nosebleeds.        Difficulty chewing  Eyes:  Negative for eye problems.  Respiratory:  Positive for shortness of breath (With exertion, mild/at baseline). Negative for cough and hemoptysis.   Cardiovascular:  Positive for chest pain (Intermittent, none today). Negative for leg swelling and palpitations.  Gastrointestinal:  Negative for abdominal pain, blood in stool, constipation, diarrhea, nausea and vomiting.  Genitourinary:  Negative for hematuria.   Musculoskeletal:  Positive for arthralgias and back pain.  Skin: Negative.   Neurological:  Positive for dizziness and extremity weakness (Residual deficit from CVA). Negative for headaches and light-headedness.  Hematological:  Does not bruise/bleed  easily.     PHYSICAL EXAM:  ECOG PERFORMANCE STATUS: 1 - Symptomatic but completely ambulatory  Physical Exam Vitals reviewed.  Constitutional:      Appearance: Normal appearance.  Cardiovascular:     Rate and Rhythm: Normal rate and regular rhythm.     Pulses: Normal pulses.     Heart sounds: Normal heart sounds.  Pulmonary:     Effort: Pulmonary effort is normal.      Breath sounds: Normal breath sounds.  Neurological:     General: No focal deficit present.     Mental Status: She is alert and oriented to person, place, and time.     Cranial Nerves: Cranial nerve deficit (Slight left facial hemiparesis, left-sided tongue deviation) present.     Motor: Weakness (Mild residual left upper extremity weakness following CVA) present.  Psychiatric:        Mood and Affect: Mood normal.        Behavior: Behavior normal.     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 Allena Katz: mild diverticulsis in sigmoid colon.    COLONOSCOPY WITH PROPOFOL N/A 09/16/2019   Rourk: Diverticulosis, random colon biopsies negative for microscopic colitis.   COLONOSCOPY WITH PROPOFOL N/A 06/08/2020   Procedure: COLONOSCOPY WITH PROPOFOL;  Surgeon: Corbin Ade, MD;  [facility]: [facility];  Service: Endoscopy;  Laterality: N/A;  9:15am    ESOPHAGOGASTRODUODENOSCOPY (EGD) WITH PROPOFOL N/A 04/28/2020   Procedure: ESOPHAGOGASTRODUODENOSCOPY (EGD) WITH PROPOFOL;  Surgeon: Corbin Ade, MD;  [facility];  Service: Endoscopy;  Laterality: N/A;  2:15   GIVENS CAPSULE STUDY N/A 08/12/2022   Procedure: GIVENS CAPSULE STUDY;  Surgeon: Corbin Ade, MD;  [facility]: [facility];  Service: Endoscopy;  Laterality: N/A;   YAG LASER APPLICATION Left 07/27/2015   Procedure: YAG LASER APPLICATION;  Surgeon: Susa Simmonds, MD;  Location: [facility];  Service: Ophthalmology;  Laterality: Left;    SOCIAL HISTORY:  Social History   Socioeconomic History   Marital status: Widowed    Spouse name: Not on file   Number of children: 1   Years of education: Not on file   Highest education level: 12th grade  Occupational History   Occupation: DISABLED  Tobacco Use   Smoking status: Former    Packs/day: 1.00    Years: 15.00  Additional pack years: 0.00    Total pack years: 15.00    Types: Cigarettes    Quit date: 09/11/2017    Years since quitting: 5.3   Smokeless tobacco: Former  Building services engineer Use: Never used  Substance and Sexual Activity   Alcohol use: Never   Drug use: Never   Sexual activity: Not Currently  Other Topics Concern   Not on file  Social History Narrative   Disabled, widow, lives with daughter and her family. Patient still drives.    Social Determinants of Health   Financial Resource Strain: Low Risk  (12/28/2022)   Overall Financial Resource Strain (CARDIA)    Difficulty of Paying Living Expenses: Not hard at all  Food Insecurity: No Food Insecurity (12/28/2022)   Hunger Vital Sign    Worried About Running Out of Food in the Last Year: Never true    Ran Out of Food in the Last Year: Never true  Transportation Needs: No Transportation Needs (12/28/2022)   PRAPARE - Administrator, Civil Service (Medical): No    Lack of Transportation (Non-Medical): No  Physical Activity:  Insufficiently Active (12/28/2022)   Exercise Vital Sign    Days of Exercise per Week: 1 day    Minutes of Exercise per Session: 30 min  Stress: Stress Concern Present (12/28/2022)   [employer] of Occupational Health - Occupational Stress Questionnaire    Feeling of Stress : To some extent  Social Connections: Socially Isolated (12/28/2022)   Social Connection and Isolation Panel [NHANES]    Frequency of Communication with Friends and Family: More than three times a week    Frequency of Social Gatherings with Friends and Family: Once a week    Attends Religious Services: Never    Database administrator or Organizations: No    Attends Banker Meetings: Never    Marital Status: Widowed  Intimate Partner Violence: Not At Risk (12/13/2022)   Humiliation, Afraid, Rape, and Kick questionnaire    Fear of Current or Ex-Partner: No    Emotionally Abused: No    Physically Abused: No    Sexually Abused: No    FAMILY HISTORY:  Family History  Problem Relation Age of Onset   Stroke Mother    Heart disease Mother    Diabetes Mother    Breast cancer Mother    Other Father        stomach taken out for some reason   Migraines Daughter    Colon cancer Neg Hx     CURRENT MEDICATIONS:  Outpatient Encounter Medications as of 01/17/2023  Medication Sig   albuterol (VENTOLIN HFA) 108 (90 Base) MCG/ACT inhaler Inhale 2 puffs into the lungs every 6 (six) hours as needed for wheezing or shortness of breath.   aspirin EC 81 MG tablet Take 1 tablet (81 mg total) by mouth daily. Swallow whole.   Blood Glucose Monitoring Suppl DEVI 1 each by Does not apply route in the morning, at noon, and at bedtime. May substitute to any manufacturer covered by patient's insurance.   calcium carbonate (OSCAL) 1500 (600 Ca) MG TABS tablet Take 1,500 mg by mouth 2 (two) times daily with a meal.   celecoxib (CELEBREX) 200 MG capsule Take 1 capsule (200 mg total) by mouth daily. With food   COSENTYX  SENSOREADY, 300 MG, 150 MG/ML SOAJ Inject 150 mg into the skin every 30 (thirty) days.   desvenlafaxine (PRISTIQ) 50 MG 24 hr tablet Take 1  tablet (50 mg total) by mouth daily.   diclofenac Sodium (VOLTAREN) 1 % GEL Apply 4 g topically 4 (four) times daily.   folic acid (FOLVITE) 1 MG tablet TAKE ONE TABLET ONCE DAILY   gabapentin (NEURONTIN) 300 MG capsule Take 1 capsule (300 mg total) by mouth 3 (three) times daily.   Glucose Blood (BLOOD GLUCOSE TEST STRIPS) STRP 1 each by In Vitro route 2 (two) times daily before a meal. May substitute to any manufacturer covered by patient's insurance.   hydroxychloroquine (PLAQUENIL) 200 MG tablet Take 200 mg by mouth 2 (two) times daily.   leflunomide (ARAVA) 20 MG tablet 1 tablet Orally Once a day for RA for 30 days   levothyroxine (SYNTHROID) 125 MCG tablet Take 1 tablet (125 mcg total) by mouth every morning.   liothyronine (CYTOMEL) 5 MCG tablet Take 2 tablets (10 mcg total) by mouth daily.   megestrol (MEGACE) 40 MG/ML suspension TAKE 10 MLS BY MOUTH 2 TIMES A DAY (Patient taking differently: Take 10 mLs by mouth 2 (two) times daily.)   metFORMIN (GLUCOPHAGE-XR) 500 MG 24 hr tablet Take 1 tablet (500 mg total) by mouth daily with breakfast.   potassium chloride SA (KLOR-CON M) 20 MEQ tablet TAKE ONE TABLET ONCE DAILY, NEED DOCTOR APPT. (Patient taking differently: Take 20 mEq by mouth daily.)   rosuvastatin (CRESTOR) 40 MG tablet Take 1 tablet (40 mg total) by mouth daily. For cholesterol   traZODone (DESYREL) 100 MG tablet Take 1 tablet (100 mg total) by mouth at bedtime as needed. for sleep   No facility-administered encounter medications on file as of 01/17/2023.    ALLERGIES:  Allergies  Allergen Reactions   Adalimumab     Other reaction(s): no response Pt is unaware of this Other reaction(s): no response   Methotrexate Other (See Comments)    Other reaction(s): GI side effects   Sulfasalazine     Other Reaction(s): Anemia   Upadacitinib  Other (See Comments)    Other reaction(s): GI side effects    LABORATORY DATA:  I have reviewed the labs as listed.  CBC    Component Value Date/Time   WBC 6.0 01/12/2023 1419   RBC 3.83 (L) 01/12/2023 1419   HGB 10.3 (L) 01/12/2023 1419   HGB 7.7 (LL) 04/20/2022 1103   HCT 34.0 (L) 01/12/2023 1419   HCT 25.4 (L) 04/20/2022 1103   PLT 257 01/12/2023 1419   PLT 453 (H) 04/20/2022 1103   MCV 88.8 01/12/2023 1419   MCV 89 04/20/2022 1103   MCH 26.9 01/12/2023 1419   MCHC 30.3 01/12/2023 1419   RDW 15.5 01/12/2023 1419   RDW 14.5 04/20/2022 1103   LYMPHSABS 1.4 01/12/2023 1419   LYMPHSABS 1.8 04/20/2022 1103   MONOABS 0.5 01/12/2023 1419   EOSABS 0.0 01/12/2023 1419   EOSABS 0.2 04/20/2022 1103   BASOSABS 0.0 01/12/2023 1419   BASOSABS 0.0 04/20/2022 1103      Latest Ref Rng & Units 01/04/2023    4:32 PM 12/29/2022   12:32 PM 10/24/2022    2:43 PM  CMP  Glucose 70 - 99 mg/dL 629  528  413   BUN 8 - 23 mg/dL 8  16  12    Creatinine 0.44 - 1.00 mg/dL 2.44  0.10  2.72   Sodium 135 - 145 mmol/L 135  132  140   Potassium 3.5 - 5.1 mmol/L 3.5  3.9  3.6   Chloride 98 - 111 mmol/L 104  104  103  CO2 22 - 32 mmol/L 22  22  22    Calcium 8.9 - 10.3 mg/dL 8.6  7.9  9.1   Total Protein 6.5 - 8.1 g/dL 6.2  5.7  6.0   Total Bilirubin 0.3 - 1.2 mg/dL 0.4  0.3  0.2   Alkaline Phos 38 - 126 U/L 85  81  87   AST 15 - 41 U/L 12  11  8    ALT 0 - 44 U/L 8  7  12      DIAGNOSTIC IMAGING:  I have independently reviewed the relevant imaging and discussed with the patient.   WRAP UP:  All questions were answered. The patient knows to call the clinic with any problems, questions or concerns.  Medical decision making: Moderate  Time spent on visit: I spent 20 minutes counseling the patient face to face. The total time spent in the appointment was 30 minutes and more than 50% was on counseling.  Carnella Guadalajara, PA-C  01/18/23 3:26 PM

## 2023-01-18 ENCOUNTER — Encounter: Payer: Self-pay | Admitting: Hematology

## 2023-01-18 ENCOUNTER — Other Ambulatory Visit: Payer: Self-pay

## 2023-01-18 ENCOUNTER — Telehealth: Payer: Self-pay | Admitting: Physician Assistant

## 2023-01-18 ENCOUNTER — Encounter: Payer: Self-pay | Admitting: Family Medicine

## 2023-01-18 DIAGNOSIS — D500 Iron deficiency anemia secondary to blood loss (chronic): Secondary | ICD-10-CM

## 2023-01-18 DIAGNOSIS — R803 Bence Jones proteinuria: Secondary | ICD-10-CM

## 2023-01-18 DIAGNOSIS — D649 Anemia, unspecified: Secondary | ICD-10-CM

## 2023-01-18 DIAGNOSIS — D472 Monoclonal gammopathy: Secondary | ICD-10-CM

## 2023-01-18 NOTE — Telephone Encounter (Signed)
I spoke at length with the patient's rheumatologist Azucena Fallen, PA-C of [facility]) regarding the patient's ongoing anemia.  We agree that the patient is in a difficult position, as both her severe RA and severe anemia have been difficult to manage.  Discussed my concern that patient's rheumatologic medications (leflunomide, hydroxychloroquine, and Cosentyx) could be causing significant anemia.  They plan to stop her leflunomide and give cholestyramine to help clear it from her system.  We will continue to follow CBC trend (WILL FAX RESULTS TO [facility]), and hopefully will see within 6 weeks whether or not the patient's blood counts are continuing to trend downward.  Azucena Fallen, PA-C states that her office will contact patient in the morning to go over the above changes.  Carnella Guadalajara, PA-C 01/18/23 6:35 PM

## 2023-01-24 ENCOUNTER — Ambulatory Visit: Payer: 59 | Admitting: Family Medicine

## 2023-01-30 ENCOUNTER — Inpatient Hospital Stay: Payer: 59 | Attending: Hematology | Admitting: Dietician

## 2023-01-30 ENCOUNTER — Telehealth: Payer: Self-pay | Admitting: Dietician

## 2023-01-30 DIAGNOSIS — D649 Anemia, unspecified: Secondary | ICD-10-CM | POA: Insufficient documentation

## 2023-01-30 NOTE — Telephone Encounter (Signed)
Nutrition Assessment   Reason for Assessment: Referral (poor appetite)   ASSESSMENT: 67 year old female with Bence-jones proteinuria. Past medical history includes CKD2, HLD, RA, stroke, B12 deficiency, vit D, HTN  Spoke with patient via telephone. She reports not doing good at all today. Patient states she is constipated. Last bowel movement was Friday. Patient reports using suppository, taking a laxative, and drinking "saline solution" without relief. States she had smearing over night. Patient tried coffee this morning. Patient relates this to "powder medication" prescribed by rheumatologist. States she never had this problem until she started this medication. Patient says she could see the powder in her stool and stopped taking it. She reports she has not been eating much do to this.    Nutrition Focused Physical Exam: unable to complete (telephone visit)   Medications: Oscal, celebrex, pristiq, folvite, gabapentin, synthroid, cytomel, megace, klor-con, crestor, trazodone   Labs: 4/18 labs reviewed    Anthropometrics: Weights have increased from 98 lb 6.4 oz on 3/19  Height: 5'4" Weight: 102 lb (4/10) UBW: 107 lb (08/12/22) BMI: 17.51   NUTRITION DIAGNOSIS: Inadequate oral intake related to constipation as evidenced by patient report   INTERVENTION:  Discussed with PA-C. Triage nurse will call patient for further assessment Will mail high calorie high protein snack ideas  MONITORING, EVALUATION, GOAL: weight trends   Next Visit: Monday June 3 during infusion

## 2023-01-31 ENCOUNTER — Ambulatory Visit (INDEPENDENT_AMBULATORY_CARE_PROVIDER_SITE_OTHER): Payer: 59 | Admitting: Neurology

## 2023-01-31 ENCOUNTER — Encounter: Payer: Self-pay | Admitting: Neurology

## 2023-01-31 VITALS — BP 117/80 | HR 107 | Ht 64.0 in | Wt 90.0 lb

## 2023-01-31 DIAGNOSIS — R55 Syncope and collapse: Secondary | ICD-10-CM

## 2023-01-31 NOTE — Progress Notes (Signed)
[facility]  PATIENT: Erica Beasley DOB: Dec 24, 1955  REQUESTING CLINICIAN: Mechele Claude, MD HISTORY FROM: Patient and daughter  REASON FOR VISIT: Syncope vs. Seizure    HISTORICAL  CHIEF COMPLAINT:  Chief Complaint  Patient presents with   Hospitalization Follow-up    Rm 13, daughter present  referral for syncope vs seizure   INTERVAL HISTORY 01/31/2023:  Patient presents today for follow-up, she is accompanied by her daughter.  She is presenting today for new syncope versus seizure.  Patient reported on April 10, she went to the ED after falling at the grocery store.  Patient reported being in the aisle, she was found tying to pick up a soda but was having difficulty grabbing the bottle.  She felt like something was not right and went to the checkout line and there she was told that she fell and she had a convulsion.  She was taken to the ED denies any urinary incontinence or tongue biting.  Her workup including CT head and MRI brain did not show any acute abnormality.  She was discharged home with neuro follow-up.  Daughter reports that a week ago she had an similar event when she was at the grocery store.  She went to pick up bread, pick up the wrong type of bread and was also complaining of not feeling right.  She was with her grandson and fortunately for patient she did not pass out.  Daughter has been reporting decreased appetite, poor oral intake and anemia and severe weight loss.  She is following up with her PCP and the cancer center regarding those symptoms.   HISTORY OF PRESENT ILLNESS:  This is a 67 year old woman past medical history of COPD, hyperlipidemia hypothyroidism, vitamin B12 deficiency who is presenting for follow-up after being diagnosed with a right internal capsule stroke. Patient presented to the ED due to slurred speech, left facial weakness and left-sided weakness.  She was found to have an acute right internal capsule stroke.  Stroke  etiology likely small vessel disease but cannot rule out large vessel.  She was discharged on aspirin and Plavix for total of 21 days and plan will be for patient to continue with aspirin alone.  She is doing physical therapy at home.  She reported her speech is almost back to normal and her strength is getting better.  Denies any new falls.  She is compliant with her medication.  No new concerns.   OTHER MEDICAL CONDITIONS: Hyperlipidemia, hypothyroidism, vitamin B12 deficiency, COPD, CKD,   REVIEW OF SYSTEMS: Full 14 system review of systems performed and negative with exception of: As noted in the HPI   ALLERGIES: Allergies  Allergen Reactions   Adalimumab     Other reaction(s): no response Pt is unaware of this Other reaction(s): no response   Methotrexate Other (See Comments)    Other reaction(s): GI side effects   Sulfasalazine     Other Reaction(s): Anemia   Upadacitinib Other (See Comments)    Other reaction(s): GI side effects    HOME MEDICATIONS: Outpatient Medications Prior to Visit  Medication Sig Dispense Refill   albuterol (VENTOLIN HFA) 108 (90 Base) MCG/ACT inhaler Inhale 2 puffs into the lungs every 6 (six) hours as needed for wheezing or shortness of breath.     aspirin EC 81 MG tablet Take 1 tablet (81 mg total) by mouth daily. Swallow whole. 30 tablet 12   Blood Glucose Monitoring Suppl DEVI 1 each by Does not apply route in the  morning, at noon, and at bedtime. May substitute to any manufacturer covered by patient's insurance. 1 each 0   calcium carbonate (OSCAL) 1500 (600 Ca) MG TABS tablet Take 1,500 mg by mouth 2 (two) times daily with a meal.     COSENTYX SENSOREADY, 300 MG, 150 MG/ML SOAJ Inject 150 mg into the skin every 30 (thirty) days.     desvenlafaxine (PRISTIQ) 50 MG 24 hr tablet Take 1 tablet (50 mg total) by mouth daily. 90 tablet 3   diclofenac Sodium (VOLTAREN) 1 % GEL Apply 4 g topically 4 (four) times daily. 4 g 0   folic acid (FOLVITE) 1 MG  tablet TAKE ONE TABLET ONCE DAILY 90 tablet 3   gabapentin (NEURONTIN) 300 MG capsule Take 1 capsule (300 mg total) by mouth 3 (three) times daily. 270 capsule 3   Glucose Blood (BLOOD GLUCOSE TEST STRIPS) STRP 1 each by In Vitro route 2 (two) times daily before a meal. May substitute to any manufacturer covered by patient's insurance. 100 strip 6   hydroxychloroquine (PLAQUENIL) 200 MG tablet Take 200 mg by mouth 2 (two) times daily.     levothyroxine (SYNTHROID) 125 MCG tablet Take 1 tablet (125 mcg total) by mouth every morning. 90 tablet 3   liothyronine (CYTOMEL) 5 MCG tablet Take 2 tablets (10 mcg total) by mouth daily. 180 tablet 3   megestrol (MEGACE) 40 MG/ML suspension TAKE 10 MLS BY MOUTH 2 TIMES A DAY (Patient taking differently: Take 10 mLs by mouth 2 (two) times daily.) 480 mL 2   metFORMIN (GLUCOPHAGE-XR) 500 MG 24 hr tablet Take 1 tablet (500 mg total) by mouth daily with breakfast. 100 tablet 0   potassium chloride SA (KLOR-CON M) 20 MEQ tablet TAKE ONE TABLET ONCE DAILY, NEED DOCTOR APPT. (Patient taking differently: Take 20 mEq by mouth daily.) 30 tablet 6   rosuvastatin (CRESTOR) 40 MG tablet Take 1 tablet (40 mg total) by mouth daily. For cholesterol 90 tablet 3   traZODone (DESYREL) 100 MG tablet Take 1 tablet (100 mg total) by mouth at bedtime as needed. for sleep 90 tablet 3   celecoxib (CELEBREX) 200 MG capsule Take 1 capsule (200 mg total) by mouth daily. With food 30 capsule 5   leflunomide (ARAVA) 20 MG tablet 1 tablet Orally Once a day for RA for 30 days     No facility-administered medications prior to visit.    PAST MEDICAL HISTORY: Past Medical History:  Diagnosis Date   Aortic atherosclerosis (HCC) 11/13/2020   CKD (chronic kidney disease) stage 2, GFR 60-89 ml/min    COPD (chronic obstructive pulmonary disease) (HCC)    COVID-19 06/2019   Depression    Essential hypertension    GERD (gastroesophageal reflux disease)    H/O Clostridium difficile infection  05/03/2019   History of diabetes mellitus    Hx of colonic polyps 05/03/2019   Hyperlipidemia    Hypothyroidism    Neuropathy    Rheumatoid arthritis (HCC)    Stroke (cerebrum) (HCC) 07/09/2022   Stroke (HCC) 07/08/2022   Vitamin B12 deficiency    Vitamin D deficiency     PAST SURGICAL HISTORY: Past Surgical History:  Procedure Laterality Date   BIOPSY  09/16/2019   Procedure: BIOPSY;  Surgeon: Corbin Ade, MD;  Location: [facility];  Service: Endoscopy;;   BIOPSY  04/28/2020   Procedure: BIOPSY;  Surgeon: Corbin Ade, MD;  [facility]: [facility];  Service: Endoscopy;;   BIOPSY  06/08/2020  Procedure: BIOPSY;  Surgeon: Corbin Ade, MD;  Location: [facility];  Service: Endoscopy;;   CATARACT EXTRACTION Bilateral    COLONOSCOPY  09/2010   Dr. Allena Katz: mild diverticulsis in sigmoid colon.    COLONOSCOPY WITH PROPOFOL N/A 09/16/2019   Rourk: Diverticulosis, random colon biopsies negative for microscopic colitis.   COLONOSCOPY WITH PROPOFOL N/A 06/08/2020   Procedure: COLONOSCOPY WITH PROPOFOL;  Surgeon: Corbin Ade, MD;  [facility]: [facility];  Service: Endoscopy;  Laterality: N/A;  9:15am   ESOPHAGOGASTRODUODENOSCOPY (EGD) WITH PROPOFOL N/A 04/28/2020   Procedure: ESOPHAGOGASTRODUODENOSCOPY (EGD) WITH PROPOFOL;  Surgeon: Corbin Ade, MD;  [facility]: [facility];  Service: Endoscopy;  Laterality: N/A;  2:15   GIVENS CAPSULE STUDY N/A 08/12/2022   Procedure: GIVENS CAPSULE STUDY;  Surgeon: Corbin Ade, MD;  [facility]: [facility];  Service: Endoscopy;  Laterality: N/A;   YAG LASER APPLICATION Left 07/27/2015   Procedure: YAG LASER APPLICATION;  Surgeon: Susa Simmonds, MD;  Location: [facility];  Service: Ophthalmology;  Laterality: Left;    FAMILY HISTORY: Family History  Problem Relation Age of Onset   Stroke Mother    Heart disease Mother    Diabetes Mother    Breast cancer Mother    Other Father        stomach taken out for some reason   Migraines  Daughter    Colon cancer Neg Hx     SOCIAL HISTORY: Social History   Socioeconomic History   Marital status: Widowed    Spouse name: Not on file   Number of children: 1   Years of education: Not on file   Highest education level: 12th grade  Occupational History   Occupation: DISABLED  Tobacco Use   Smoking status: Former    Packs/day: 1.00    Years: 15.00    Additional pack years: 0.00    Total pack years: 15.00    Types: Cigarettes    Quit date: 09/11/2017    Years since quitting: 5.3   Smokeless tobacco: Former  Building services engineer Use: Never used  Substance and Sexual Activity   Alcohol use: Never   Drug use: Never   Sexual activity: Not Currently  Other Topics Concern   Not on file  Social History Narrative   Disabled, widow, lives with daughter and her family. Patient still drives.    Social Determinants of Health   Financial Resource Strain: Low Risk  (12/28/2022)   Overall Financial Resource Strain (CARDIA)    Difficulty of Paying Living Expenses: Not hard at all  Food Insecurity: No Food Insecurity (12/28/2022)   Hunger Vital Sign    Worried About Running Out of Food in the Last Year: Never true    Ran Out of Food in the Last Year: Never true  Transportation Needs: No Transportation Needs (12/28/2022)   PRAPARE - Administrator, Civil Service (Medical): No    Lack of Transportation (Non-Medical): No  Physical Activity: Insufficiently Active (12/28/2022)   Exercise Vital Sign    Days of Exercise per Week: 1 day    Minutes of Exercise per Session: 30 min  Stress: Stress Concern Present (12/28/2022)   [employer] of Occupational Health - Occupational Stress Questionnaire    Feeling of Stress : To some extent  Social Connections: Socially Isolated (12/28/2022)   Social Connection and Isolation Panel [NHANES]    Frequency of Communication with Friends and Family: More than three times a week  Frequency of Social Gatherings with Friends and Family:  Once a week    Attends Religious Services: Never    Database administrator or Organizations: No    Attends Banker Meetings: Never    Marital Status: Widowed  Intimate Partner Violence: Not At Risk (12/13/2022)   Humiliation, Afraid, Rape, and Kick questionnaire    Fear of Current or Ex-Partner: No    Emotionally Abused: No    Physically Abused: No    Sexually Abused: No    PHYSICAL EXAM  GENERAL EXAM/CONSTITUTIONAL: Vitals:  Vitals:   01/31/23 0856  BP: 117/80  Pulse: (!) 107  Weight: 90 lb (40.8 kg)  Height: 5\' 4"  (1.626 m)   Body mass index is 15.45 kg/m. Wt Readings from Last 3 Encounters:  01/31/23 90 lb (40.8 kg)  01/04/23 102 lb (46.3 kg)  12/13/22 98 lb 6.4 oz (44.6 kg)   Patient is in no distress; appears thin and frail; neck is supple  MUSCULOSKELETAL: Gait, strength, tone, movements noted in Neurologic exam below  NEUROLOGIC: MENTAL STATUS:      No data to display         awake, alert, oriented to person, place and time recent and remote memory intact normal attention and concentration language fluent, comprehension intact, naming intact, no dysarthria fund of knowledge appropriate  CRANIAL NERVE:  2nd, 3rd, 4th, 6th - pupils equal and reactive to light, visual fields full to confrontation, extraocular muscles intact, no nystagmus 5th - facial sensation symmetric 7th - Left lower facial droop  8th - hearing intact 9th - palate elevates symmetrically, uvula midline 11th - shoulder shrug symmetric 12th - tongue protrusion midline  MOTOR:  normal bulk and tone, full strength in the BUE, BLE .   SENSORY:  normal and symmetric to light touch  COORDINATION:  Normal finger-nose-finger  GAIT/STATION:   Wide based, unsteady    DIAGNOSTIC DATA (LABS, IMAGING, TESTING) - I reviewed patient records, labs, notes, testing and imaging myself where available.  Lab Results  Component Value Date   WBC 6.0 01/12/2023   HGB 10.3 (L)  01/12/2023   HCT 34.0 (L) 01/12/2023   MCV 88.8 01/12/2023   PLT 257 01/12/2023      Component Value Date/Time   NA 135 01/04/2023 1632   NA 140 10/24/2022 1443   K 3.5 01/04/2023 1632   CL 104 01/04/2023 1632   CO2 22 01/04/2023 1632   GLUCOSE 112 (H) 01/04/2023 1632   BUN 8 01/04/2023 1632   BUN 12 10/24/2022 1443   CREATININE 0.69 01/04/2023 1632   CREATININE 0.64 02/01/2019 1333   CALCIUM 8.6 (L) 01/04/2023 1632   CALCIUM 9.1 03/10/2020 1156   PROT 6.2 (L) 01/04/2023 1632   PROT 6.0 10/24/2022 1443   ALBUMIN 2.7 (L) 01/04/2023 1632   ALBUMIN 3.7 (L) 10/24/2022 1443   AST 12 (L) 01/04/2023 1632   ALT 8 01/04/2023 1632   ALKPHOS 85 01/04/2023 1632   BILITOT 0.4 01/04/2023 1632   BILITOT 0.2 10/24/2022 1443   GFRNONAA >60 01/04/2023 1632   GFRAA 72 11/13/2020 1129   Lab Results  Component Value Date   CHOL 121 10/24/2022   HDL 66 10/24/2022   LDLCALC 37 10/24/2022   TRIG 100 10/24/2022   CHOLHDL 1.8 10/24/2022   Lab Results  Component Value Date   HGBA1C 9.0 (H) 10/24/2022   Lab Results  Component Value Date   VITAMINB12 1,857 (H) 10/24/2022   Lab Results  Component Value  Date   TSH 1.160 10/24/2022    MRI Brain/MRA Head  1. 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 2. No intracranial large vessel occlusion. Mild stenosis in the right-greater-than-left cavernous and supraclinoid ICAs  Carotid US  1. Atherosclerotic plaque involving bilateral carotid arteries most prominent at the carotid bulbs. Estimated degree of stenosis in the internal carotid arteries is 50-69% bilaterally. 2. Patent vertebral arteries with antegrade flow.  MRI Brain 01/04/2023 1. No acute intracranial abnormality. 2. Small remote lacunar infarct at the right basal ganglia.    ASSESSMENT AND PLAN  67 y.o. year old female with history of hyperlipidemia, hypothyroidism, COPD, previous right basal ganglia stroke who is  presenting after seizure versus syncope.  Family has reported prior to the event that patient was not eating well, lost a lot of weight, recently diagnosed with anemia, not drinking enough fluid, only [facility].  There was report of convulsion by grocery store staff.  Plan will be to obtain a routine EEG.  Her MRI did not show any acute abnormality.  I will contact her to go over the results.  At the moment I have advised patient to refrain from driving and she is comfortable with plan.  Follow-up with me as scheduled in November.    1. Syncope, unspecified syncope type      Patient Instructions  Continue current medications  Routine EEG  Increase PO intake, use protein supplement after each meal  Follow up as scheduled in November   Orders Placed This Encounter  Procedures   EEG adult    No orders of the defined types were placed in this encounter.   No follow-ups on file.   Windell Norfolk, MD 01/31/2023, 1:01 PM  [facility] [address], [address] [place], [place] [address] [phone]

## 2023-01-31 NOTE — Patient Instructions (Signed)
Continue current medications  Routine EEG  Increase PO intake, use protein supplement after each meal  Follow up as scheduled in November

## 2023-02-03 ENCOUNTER — Inpatient Hospital Stay: Payer: 59

## 2023-02-03 DIAGNOSIS — D649 Anemia, unspecified: Secondary | ICD-10-CM | POA: Diagnosis not present

## 2023-02-03 DIAGNOSIS — R803 Bence Jones proteinuria: Secondary | ICD-10-CM

## 2023-02-03 DIAGNOSIS — D472 Monoclonal gammopathy: Secondary | ICD-10-CM

## 2023-02-03 DIAGNOSIS — D500 Iron deficiency anemia secondary to blood loss (chronic): Secondary | ICD-10-CM

## 2023-02-03 LAB — CBC WITH DIFFERENTIAL/PLATELET
Abs Immature Granulocytes: 0.03 10*3/uL (ref 0.00–0.07)
Basophils Absolute: 0 10*3/uL (ref 0.0–0.1)
Basophils Relative: 1 %
Eosinophils Absolute: 0.3 10*3/uL (ref 0.0–0.5)
Eosinophils Relative: 5 %
HCT: 29.6 % — ABNORMAL LOW (ref 36.0–46.0)
Hemoglobin: 9.2 g/dL — ABNORMAL LOW (ref 12.0–15.0)
Immature Granulocytes: 1 %
Lymphocytes Relative: 31 %
Lymphs Abs: 1.8 10*3/uL (ref 0.7–4.0)
MCH: 27.1 pg (ref 26.0–34.0)
MCHC: 31.1 g/dL (ref 30.0–36.0)
MCV: 87.3 fL (ref 80.0–100.0)
Monocytes Absolute: 0.5 10*3/uL (ref 0.1–1.0)
Monocytes Relative: 9 %
Neutro Abs: 3.2 10*3/uL (ref 1.7–7.7)
Neutrophils Relative %: 53 %
Platelets: 278 10*3/uL (ref 150–400)
RBC: 3.39 MIL/uL — ABNORMAL LOW (ref 3.87–5.11)
RDW: 16.5 % — ABNORMAL HIGH (ref 11.5–15.5)
WBC: 5.8 10*3/uL (ref 4.0–10.5)
nRBC: 0 % (ref 0.0–0.2)

## 2023-02-03 LAB — SAMPLE TO BLOOD BANK

## 2023-02-08 DIAGNOSIS — M25562 Pain in left knee: Secondary | ICD-10-CM | POA: Diagnosis not present

## 2023-02-08 DIAGNOSIS — M1991 Primary osteoarthritis, unspecified site: Secondary | ICD-10-CM | POA: Diagnosis not present

## 2023-02-08 DIAGNOSIS — D649 Anemia, unspecified: Secondary | ICD-10-CM | POA: Diagnosis not present

## 2023-02-08 DIAGNOSIS — Z681 Body mass index (BMI) 19 or less, adult: Secondary | ICD-10-CM | POA: Diagnosis not present

## 2023-02-08 DIAGNOSIS — M5136 Other intervertebral disc degeneration, lumbar region: Secondary | ICD-10-CM | POA: Diagnosis not present

## 2023-02-08 DIAGNOSIS — L409 Psoriasis, unspecified: Secondary | ICD-10-CM | POA: Diagnosis not present

## 2023-02-08 DIAGNOSIS — L4059 Other psoriatic arthropathy: Secondary | ICD-10-CM | POA: Diagnosis not present

## 2023-02-08 DIAGNOSIS — M0579 Rheumatoid arthritis with rheumatoid factor of multiple sites without organ or systems involvement: Secondary | ICD-10-CM | POA: Diagnosis not present

## 2023-02-08 DIAGNOSIS — R5382 Chronic fatigue, unspecified: Secondary | ICD-10-CM | POA: Diagnosis not present

## 2023-02-08 DIAGNOSIS — Z79899 Other long term (current) drug therapy: Secondary | ICD-10-CM | POA: Diagnosis not present

## 2023-02-08 DIAGNOSIS — M25561 Pain in right knee: Secondary | ICD-10-CM | POA: Diagnosis not present

## 2023-02-15 ENCOUNTER — Ambulatory Visit (INDEPENDENT_AMBULATORY_CARE_PROVIDER_SITE_OTHER): Payer: 59 | Admitting: Neurology

## 2023-02-15 DIAGNOSIS — R55 Syncope and collapse: Secondary | ICD-10-CM

## 2023-02-15 NOTE — Procedures (Signed)
    History:  67 year old woman with syncope   EEG classification: Awake and drowsy  Description of the recording: The background rhythms of this recording consists of a fairly well modulated medium amplitude alpha rhythm of 12 Hz that is reactive to eye opening and closure. Present in the anterior head region is a 15-20 Hz beta activity. Photic stimulation was performed, did not show any abnormalities. Hyperventilation was also performed, did not show any abnormalities. Drowsiness was manifested by background fragmentation. No abnormal epileptiform discharges seen during this recording. There was no focal slowing. There were no electrographic seizure identified.   Abnormality: None   Impression: This is a normal EEG recorded while drowsy and awake. No evidence of interictal epileptiform discharges. Normal EEGs, however, do not rule out epilepsy.    Windell Norfolk, MD [facility]

## 2023-02-17 ENCOUNTER — Inpatient Hospital Stay: Payer: 59

## 2023-02-17 DIAGNOSIS — D649 Anemia, unspecified: Secondary | ICD-10-CM

## 2023-02-17 DIAGNOSIS — D472 Monoclonal gammopathy: Secondary | ICD-10-CM

## 2023-02-17 DIAGNOSIS — R803 Bence Jones proteinuria: Secondary | ICD-10-CM

## 2023-02-17 DIAGNOSIS — D500 Iron deficiency anemia secondary to blood loss (chronic): Secondary | ICD-10-CM

## 2023-02-17 LAB — CBC WITH DIFFERENTIAL/PLATELET
Abs Immature Granulocytes: 0.04 10*3/uL (ref 0.00–0.07)
Basophils Absolute: 0 10*3/uL (ref 0.0–0.1)
Basophils Relative: 0 %
Eosinophils Absolute: 0.1 10*3/uL (ref 0.0–0.5)
Eosinophils Relative: 1 %
HCT: 34.3 % — ABNORMAL LOW (ref 36.0–46.0)
Hemoglobin: 10.1 g/dL — ABNORMAL LOW (ref 12.0–15.0)
Immature Granulocytes: 1 %
Lymphocytes Relative: 28 %
Lymphs Abs: 2.1 10*3/uL (ref 0.7–4.0)
MCH: 27.6 pg (ref 26.0–34.0)
MCHC: 29.4 g/dL — ABNORMAL LOW (ref 30.0–36.0)
MCV: 93.7 fL (ref 80.0–100.0)
Monocytes Absolute: 0.6 10*3/uL (ref 0.1–1.0)
Monocytes Relative: 8 %
Neutro Abs: 4.7 10*3/uL (ref 1.7–7.7)
Neutrophils Relative %: 62 %
Platelets: 260 10*3/uL (ref 150–400)
RBC: 3.66 MIL/uL — ABNORMAL LOW (ref 3.87–5.11)
RDW: 17.7 % — ABNORMAL HIGH (ref 11.5–15.5)
WBC: 7.5 10*3/uL (ref 4.0–10.5)
nRBC: 0 % (ref 0.0–0.2)

## 2023-02-17 LAB — SAMPLE TO BLOOD BANK

## 2023-02-27 ENCOUNTER — Inpatient Hospital Stay: Payer: 59

## 2023-02-27 ENCOUNTER — Other Ambulatory Visit: Payer: Self-pay | Admitting: Hematology

## 2023-02-27 ENCOUNTER — Inpatient Hospital Stay: Payer: 59 | Attending: Hematology

## 2023-02-27 ENCOUNTER — Other Ambulatory Visit: Payer: Self-pay | Admitting: Family Medicine

## 2023-02-27 ENCOUNTER — Inpatient Hospital Stay: Payer: 59 | Admitting: Dietician

## 2023-02-27 VITALS — BP 132/83 | HR 76 | Temp 98.0°F | Resp 18

## 2023-02-27 DIAGNOSIS — D500 Iron deficiency anemia secondary to blood loss (chronic): Secondary | ICD-10-CM

## 2023-02-27 DIAGNOSIS — D472 Monoclonal gammopathy: Secondary | ICD-10-CM

## 2023-02-27 DIAGNOSIS — M810 Age-related osteoporosis without current pathological fracture: Secondary | ICD-10-CM | POA: Diagnosis not present

## 2023-02-27 DIAGNOSIS — D649 Anemia, unspecified: Secondary | ICD-10-CM | POA: Insufficient documentation

## 2023-02-27 DIAGNOSIS — R803 Bence Jones proteinuria: Secondary | ICD-10-CM

## 2023-02-27 LAB — CBC WITH DIFFERENTIAL/PLATELET
Abs Immature Granulocytes: 0.06 10*3/uL (ref 0.00–0.07)
Basophils Absolute: 0 10*3/uL (ref 0.0–0.1)
Basophils Relative: 0 %
Eosinophils Absolute: 0.1 10*3/uL (ref 0.0–0.5)
Eosinophils Relative: 1 %
HCT: 34.6 % — ABNORMAL LOW (ref 36.0–46.0)
Hemoglobin: 10.6 g/dL — ABNORMAL LOW (ref 12.0–15.0)
Immature Granulocytes: 1 %
Lymphocytes Relative: 39 %
Lymphs Abs: 3.3 10*3/uL (ref 0.7–4.0)
MCH: 28.9 pg (ref 26.0–34.0)
MCHC: 30.6 g/dL (ref 30.0–36.0)
MCV: 94.3 fL (ref 80.0–100.0)
Monocytes Absolute: 0.6 10*3/uL (ref 0.1–1.0)
Monocytes Relative: 7 %
Neutro Abs: 4.4 10*3/uL (ref 1.7–7.7)
Neutrophils Relative %: 52 %
Platelets: 237 10*3/uL (ref 150–400)
RBC: 3.67 MIL/uL — ABNORMAL LOW (ref 3.87–5.11)
RDW: 18.5 % — ABNORMAL HIGH (ref 11.5–15.5)
WBC: 8.5 10*3/uL (ref 4.0–10.5)
nRBC: 0 % (ref 0.0–0.2)

## 2023-02-27 LAB — SAMPLE TO BLOOD BANK

## 2023-02-27 MED ORDER — SODIUM CHLORIDE 0.9 % IV SOLN: Freq: Once | INTRAVENOUS | Status: AC

## 2023-02-27 MED ORDER — ZOLEDRONIC ACID 4 MG/100ML IV SOLN
4.0000 mg | Freq: Once | INTRAVENOUS | Status: AC
  Administered 2023-02-27: 4 mg via INTRAVENOUS
  Filled 2023-02-27: qty 100

## 2023-02-27 NOTE — Progress Notes (Signed)
Patient presents today for Zometa infusion.  Patient is in satisfactory condition with no new complaints voiced.  Vital signs are stable. IV placed in L arm.  IV flushed well with good blood return noted.  We will proceed with treatment per MD orders.    Patient tolerated infusion well with no complaints voiced.  Patient left ambulatory in stable condition.  Vital signs stable at discharge.  Follow up as scheduled.

## 2023-02-27 NOTE — Progress Notes (Signed)
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 She is receiving zometa q12w.  Met with patient in infusion. She reports appetite has significantly improved. Patient eating "everything she can get her hands on." Yesterday she recalls 3 BBQ sandwiches with slaw and chips for lunch. Patient had 4 hotdogs (chili, mustard, onions, ketchup) for dinner. She reports drinking more water. Patient denies nutrition impact symptoms.    Medications: reviewed   Labs: reviewed   Anthropometrics: Wt 98.6 lb today increased   5/7 - 90 lb    NUTRITION DIAGNOSIS: Inadequate oral intake improved    INTERVENTION:  Encouraged high calorie high protein foods to promote weight gain     MONITORING, EVALUATION, GOAL: weight trends, intake   NEXT VISIT: To be scheduled as needed

## 2023-02-27 NOTE — Patient Instructions (Signed)
[facility]  Discharge Instructions: Thank you for choosing [facility] to provide your oncology and hematology care.  If you have a lab appointment with the Cancer Center - please note that after April 8th, 2024, all labs will be drawn in the cancer center.  You do not have to check in or register with the main entrance as you have in the past but will complete your check-in in the cancer center.  Wear comfortable clothing and clothing appropriate for easy access to any Portacath or PICC line.   We strive to give you quality time with your provider. You may need to reschedule your appointment if you arrive late (15 or more minutes).  Arriving late affects you and other patients whose appointments are after yours.  Also, if you miss three or more appointments without notifying the office, you may be dismissed from the clinic at the provider's discretion.      For prescription refill requests, have your pharmacy contact our office and allow 72 hours for refills to be completed.    Today you received the following chemotherapy and/or immunotherapy agents Zometa.  Zoledronic Acid Injection (Cancer) What is this medication? ZOLEDRONIC ACID (ZOE le dron ik AS id) treats high calcium levels in the blood caused by cancer. It may also be used with chemotherapy to treat weakened bones caused by cancer. It works by slowing down the release of calcium from bones. This lowers calcium levels in your blood. It also makes your bones stronger and less likely to break (fracture). It belongs to a group of medications called bisphosphonates. This medicine may be used for other purposes; ask your health care provider or pharmacist if you have questions. COMMON BRAND NAME(S): Zometa, Zometa Powder What should I tell my care team before I take this medication? They need to know if you have any of these conditions: Dehydration Dental disease Kidney disease Liver disease Low levels  of calcium in the blood Lung or breathing disease, such as asthma Receiving steroids, such as dexamethasone or prednisone An unusual or allergic reaction to zoledronic acid, other medications, foods, dyes, or preservatives Pregnant or trying to get pregnant Breast-feeding How should I use this medication? This medication is injected into a vein. It is given by your care team in a hospital or clinic setting. Talk to your care team about the use of this medication in children. Special care may be needed. Overdosage: If you think you have taken too much of this medicine contact a poison control center or emergency room at once. NOTE: This medicine is only for you. Do not share this medicine with others. What if I miss a dose? Keep appointments for follow-up doses. It is important not to miss your dose. Call your care team if you are unable to keep an appointment. What may interact with this medication? Certain antibiotics given by injection Diuretics, such as bumetanide, furosemide NSAIDs, medications for pain and inflammation, such as ibuprofen or naproxen Teriparatide Thalidomide This list may not describe all possible interactions. Give your health care provider a list of all the medicines, herbs, non-prescription drugs, or dietary supplements you use. Also tell them if you smoke, drink alcohol, or use illegal drugs. Some items may interact with your medicine. What should I watch for while using this medication? Visit your care team for regular checks on your progress. It may be some time before you see the benefit from this medication. Some people who take this medication have  severe bone, joint, or muscle pain. This medication may also increase your risk for jaw problems or a broken thigh bone. Tell your care team right away if you have severe pain in your jaw, bones, joints, or muscles. Tell you care team if you have any pain that does not go away or that gets worse. Tell your dentist and  dental surgeon that you are taking this medication. You should not have major dental surgery while on this medication. See your dentist to have a dental exam and fix any dental problems before starting this medication. Take good care of your teeth while on this medication. Make sure you see your dentist for regular follow-up appointments. You should make sure you get enough calcium and vitamin D while you are taking this medication. Discuss the foods you eat and the vitamins you take with your care team. Check with your care team if you have severe diarrhea, nausea, and vomiting, or if you sweat a lot. The loss of too much body fluid may make it dangerous for you to take this medication. You may need bloodwork while taking this medication. Talk to your care team if you wish to become pregnant or think you might be pregnant. This medication can cause serious birth defects. What side effects may I notice from receiving this medication? Side effects that you should report to your care team as soon as possible: Allergic reactions--skin rash, itching, hives, swelling of the face, lips, tongue, or throat Kidney injury--decrease in the amount of urine, swelling of the ankles, hands, or feet Low calcium level--muscle pain or cramps, confusion, tingling, or numbness in the hands or feet Osteonecrosis of the jaw--pain, swelling, or redness in the mouth, numbness of the jaw, poor healing after dental work, unusual discharge from the mouth, visible bones in the mouth Severe bone, joint, or muscle pain Side effects that usually do not require medical attention (report to your care team if they continue or are bothersome): Constipation Fatigue Fever Loss of appetite Nausea Stomach pain This list may not describe all possible side effects. Call your doctor for medical advice about side effects. You may report side effects to FDA at 1-800-FDA-1088. Where should I keep my medication? This medication is given in  a hospital or clinic. It will not be stored at home. NOTE: This sheet is a summary. It may not cover all possible information. If you have questions about this medicine, talk to your doctor, pharmacist, or health care provider.  2024 Elsevier/Gold Standard (2021-11-05 00:00:00)        To help prevent nausea and vomiting after your treatment, we encourage you to take your nausea medication as directed.  BELOW ARE SYMPTOMS THAT SHOULD BE REPORTED IMMEDIATELY: *FEVER GREATER THAN 100.4 F (38 C) OR HIGHER *CHILLS OR SWEATING *NAUSEA AND VOMITING THAT IS NOT CONTROLLED WITH YOUR NAUSEA MEDICATION *UNUSUAL SHORTNESS OF BREATH *UNUSUAL BRUISING OR BLEEDING *URINARY PROBLEMS (pain or burning when urinating, or frequent urination) *BOWEL PROBLEMS (unusual diarrhea, constipation, pain near the anus) TENDERNESS IN MOUTH AND THROAT WITH OR WITHOUT PRESENCE OF ULCERS (sore throat, sores in mouth, or a toothache) UNUSUAL RASH, SWELLING OR PAIN  UNUSUAL VAGINAL DISCHARGE OR ITCHING   Items with * indicate a potential emergency and should be followed up as soon as possible or go to the Emergency Department if any problems should occur.  Please show the CHEMOTHERAPY ALERT CARD or IMMUNOTHERAPY ALERT CARD at check-in to the Emergency Department and triage nurse.  Should you have  questions after your visit or need to cancel or reschedule your appointment, please contact [facility] [phone]  and follow the prompts.  Office hours are 8:00 a.m. to 4:30 p.m. Monday - Friday. Please note that voicemails left after 4:00 p.m. may not be returned until the following business day.  We are closed weekends and major holidays. You have access to a nurse at all times for urgent questions. Please call the main number to the clinic [phone] and follow the prompts.  For any non-urgent questions, you may also contact your provider using MyChart. We now offer e-Visits for anyone 38 and older to  request care online for non-urgent symptoms. For details visit [email or web address].   Also download the MyChart app! Go to the app store, search "MyChart", open the app, select [facility] [place], and log in with your MyChart username and password.

## 2023-02-28 ENCOUNTER — Encounter: Payer: Self-pay | Admitting: Family Medicine

## 2023-02-28 ENCOUNTER — Ambulatory Visit (INDEPENDENT_AMBULATORY_CARE_PROVIDER_SITE_OTHER): Payer: 59 | Admitting: Family Medicine

## 2023-02-28 VITALS — BP 132/72 | HR 85 | Temp 98.1°F | Ht 64.0 in | Wt 98.4 lb

## 2023-02-28 DIAGNOSIS — Z7984 Long term (current) use of oral hypoglycemic drugs: Secondary | ICD-10-CM

## 2023-02-28 DIAGNOSIS — I10 Essential (primary) hypertension: Secondary | ICD-10-CM | POA: Diagnosis not present

## 2023-02-28 DIAGNOSIS — E039 Hypothyroidism, unspecified: Secondary | ICD-10-CM | POA: Diagnosis not present

## 2023-02-28 DIAGNOSIS — E119 Type 2 diabetes mellitus without complications: Secondary | ICD-10-CM

## 2023-02-28 DIAGNOSIS — E782 Mixed hyperlipidemia: Secondary | ICD-10-CM

## 2023-02-28 LAB — CBC WITH DIFFERENTIAL/PLATELET
Basos: 0 %
EOS (ABSOLUTE): 0.1 10*3/uL (ref 0.0–0.4)
Hemoglobin: 10.1 g/dL — ABNORMAL LOW (ref 11.1–15.9)
Immature Granulocytes: 1 %
Lymphs: 15 %
MCH: 28 pg (ref 26.6–33.0)
Neutrophils Absolute: 6.4 10*3/uL (ref 1.4–7.0)
Neutrophils: 78 %
Platelets: 236 10*3/uL (ref 150–450)
WBC: 8.1 10*3/uL (ref 3.4–10.8)

## 2023-02-28 LAB — CMP14+EGFR

## 2023-02-28 LAB — TSH+FREE T4

## 2023-02-28 LAB — BAYER DCA HB A1C WAIVED: HB A1C (BAYER DCA - WAIVED): 5.9 % — ABNORMAL HIGH (ref 4.8–5.6)

## 2023-02-28 MED ORDER — METFORMIN HCL ER 500 MG PO TB24: 500.0000 mg | ORAL_TABLET | Freq: Two times a day (BID) | ORAL | 1 refills | Status: DC

## 2023-02-28 NOTE — Progress Notes (Signed)
Subjective:  Patient ID: Erica Beasley, female    DOB: 02-13-1956  Age: 67 y.o. MRN: [id number]  CC: Medical Management of Chronic Issues   HPI KAMEESHA WESTRA presents for diabetes. presents for Follow-up of diabetes. Patient checks blood sugar at home.   80-120 fasting and 115-300 postprandial. She is taking prednisone currently in transition between [facility] with rheumatology. Marked increase noted during the month of May.  Patient denies symptoms such as polyuria, polydipsia, excessive hunger, nausea No significant hypoglycemic spells noted. Medications reviewed. Pt reports taking them regularly without complication/adverse reaction being reported today.  Lab Results  Component Value Date   HGBA1C 9.0 (H) 10/24/2022   HGBA1C 4.6 (L) 07/08/2022   HGBA1C 4.8 01/28/2022    Taking prednisone, 2 a day for RA. Also taking plaquenil. Rheumatology working on getting her on biologic.      02/28/2023    3:57 PM 02/28/2023    3:53 PM 12/13/2022    2:13 PM  Depression screen PHQ 2/9  Decreased Interest 3 0 3  Down, Depressed, Hopeless 0 0 2  PHQ - 2 Score 3 0 5  Altered sleeping 0  3  Tired, decreased energy 3  3  Change in appetite 0  3  Feeling bad or failure about yourself  0  0  Trouble concentrating 0  0  Moving slowly or fidgety/restless 0  0  Suicidal thoughts 0  0  PHQ-9 Score 6  14  Difficult doing work/chores Not difficult at all  Not difficult at all    History Abbrielle has a past medical history of Aortic atherosclerosis (HCC) (11/13/2020), CKD (chronic kidney disease) stage 2, GFR 60-89 ml/min, COPD (chronic obstructive pulmonary disease) (HCC), COVID-19 (06/2019), Depression, Essential hypertension, GERD (gastroesophageal reflux disease), H/O Clostridium difficile infection (05/03/2019), History of diabetes mellitus, colonic polyps (05/03/2019), Hyperlipidemia, Hypothyroidism, Neuropathy, Rheumatoid arthritis (HCC), 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   She has a past surgical history that includes Yag laser application (Left, 07/27/2015); Colonoscopy (09/2010); Colonoscopy with propofol (N/A, 09/16/2019); biopsy (09/16/2019); Esophagogastroduodenoscopy (egd) with propofol (N/A, 04/28/2020); biopsy (04/28/2020); Cataract extraction (Bilateral); Colonoscopy with propofol (N/A, 06/08/2020); biopsy (06/08/2020); and Givens capsule study (N/A, 08/12/2022).   Her family history includes Breast cancer in her mother; Diabetes in her mother; Heart disease in her mother; Migraines in her daughter; Other in her father; Stroke in her mother.She reports that she quit smoking about 5 years ago. Her smoking use included cigarettes. She has a 15.00 pack-year smoking history. She has quit using smokeless tobacco. She reports that she does not drink alcohol and does not use drugs.    ROS Review of Systems  Constitutional: Negative.   HENT: Negative.    Eyes:  Negative for visual disturbance.  Respiratory:  Negative for shortness of breath.   Cardiovascular:  Negative for chest pain.  Gastrointestinal:  Negative for abdominal pain.  Musculoskeletal:  Positive for arthralgias.    Objective:  BP 132/72   Pulse 85   Temp 98.1 F (36.7 C)   Ht 5\' 4"  (1.626 m)   Wt 98 lb 6.4 oz (44.6 kg)   SpO2 97%   BMI 16.89 kg/m   BP Readings from Last 3 Encounters:  02/28/23 132/72  02/27/23 132/83  01/31/23 117/80    Wt Readings from Last 3 Encounters:  02/28/23 98 lb 6.4 oz (44.6 kg)  02/27/23 98 lb 9.6 oz (44.7 kg)  01/31/23 90 lb (40.8 kg)  Physical Exam Constitutional:      General: She is not in acute distress.    Appearance: She is well-developed.  Cardiovascular:     Rate and Rhythm: Normal rate and regular rhythm.  Pulmonary:     Breath sounds: Normal breath sounds.  Musculoskeletal:        General: Normal range of motion.  Skin:    General: Skin is warm and dry.  Neurological:     Mental  Status: She is alert and oriented to person, place, and time.       Assessment & Plan:   Shaiel was seen today for medical management of chronic issues.  Diagnoses and all orders for this visit:  New onset type 2 diabetes mellitus (HCC) -     Bayer DCA Hb A1c Waived  Hypothyroidism, unspecified type -     TSH + free T4  Mixed hyperlipidemia -     Lipid panel  Essential hypertension -     CBC with Differential/Platelet -     CMP14+EGFR  Other orders -     metFORMIN (GLUCOPHAGE-XR) 500 MG 24 hr tablet; Take 1 tablet (500 mg total) by mouth 2 (two) times daily with a meal.       I have discontinued Indianna A. Sigler's Cosentyx Sensoready (300 MG). I have also changed her metFORMIN. Additionally, I am having her maintain her hydroxychloroquine, albuterol, calcium carbonate, diclofenac Sodium, aspirin EC, rosuvastatin, potassium chloride SA, gabapentin, levothyroxine, liothyronine, traZODone, folic acid, Blood Glucose Monitoring Suppl, BLOOD GLUCOSE TEST STRIPS, desvenlafaxine, and megestrol.  Allergies as of 02/28/2023       Reactions   Adalimumab    Other reaction(s): no response Pt is unaware of this Other reaction(s): no response   Methotrexate Other (See Comments)   Other reaction(s): GI side effects   Sulfasalazine    Other Reaction(s): Anemia   Upadacitinib Other (See Comments)   Other reaction(s): GI side effects        Medication List        Accurate as of February 28, 2023  4:30 PM. If you have any questions, ask your nurse or doctor.          STOP taking these medications    Cosentyx Sensoready (300 MG) 150 MG/ML Soaj Generic drug: Secukinumab (300 MG Dose) Stopped by: Mechele Claude, MD       TAKE these medications    albuterol 108 (90 Base) MCG/ACT inhaler Commonly known as: VENTOLIN HFA Inhale 2 puffs into the lungs every 6 (six) hours as needed for wheezing or shortness of breath.   aspirin EC 81 MG tablet Take 1 tablet (81 mg total) by  mouth daily. Swallow whole.   Blood Glucose Monitoring Suppl Devi 1 each by Does not apply route in the morning, at noon, and at bedtime. May substitute to any manufacturer covered by patient's insurance.   BLOOD GLUCOSE TEST STRIPS Strp 1 each by In Vitro route 2 (two) times daily before a meal. May substitute to any manufacturer covered by patient's insurance.   calcium carbonate 1500 (600 Ca) MG Tabs tablet Commonly known as: OSCAL Take 1,500 mg by mouth 2 (two) times daily with a meal.   desvenlafaxine 50 MG 24 hr tablet Commonly known as: Pristiq Take 1 tablet (50 mg total) by mouth daily.   diclofenac Sodium 1 % Gel Commonly known as: Voltaren Apply 4 g topically 4 (four) times daily.   folic acid 1 MG tablet Commonly known as: FOLVITE TAKE ONE  TABLET ONCE DAILY   gabapentin 300 MG capsule Commonly known as: NEURONTIN Take 1 capsule (300 mg total) by mouth 3 (three) times daily.   hydroxychloroquine 200 MG tablet Commonly known as: PLAQUENIL Take 200 mg by mouth 2 (two) times daily.   levothyroxine 125 MCG tablet Commonly known as: SYNTHROID Take 1 tablet (125 mcg total) by mouth every morning.   liothyronine 5 MCG tablet Commonly known as: CYTOMEL Take 2 tablets (10 mcg total) by mouth daily.   megestrol 40 MG/ML suspension Commonly known as: MEGACE Take 10 mLs (400 mg total) by mouth 2 (two) times daily.   metFORMIN 500 MG 24 hr tablet Commonly known as: GLUCOPHAGE-XR Take 1 tablet (500 mg total) by mouth 2 (two) times daily with a meal. What changed: See the new instructions. Changed by: Mechele Claude, MD   potassium chloride SA 20 MEQ tablet Commonly known as: KLOR-CON M TAKE ONE TABLET ONCE DAILY, NEED DOCTOR APPT. What changed: See the new instructions.   rosuvastatin 40 MG tablet Commonly known as: Crestor Take 1 tablet (40 mg total) by mouth daily. For cholesterol   traZODone 100 MG tablet Commonly known as: DESYREL Take 1 tablet (100 mg  total) by mouth at bedtime as needed. for sleep         Follow-up: Return in about 3 months (around 05/31/2023).  Mechele Claude, M.D.

## 2023-03-01 LAB — CMP14+EGFR
AST: 28 IU/L (ref 0–40)
Albumin/Globulin Ratio: 1.7 (ref 1.2–2.2)
Alkaline Phosphatase: 162 IU/L — ABNORMAL HIGH (ref 44–121)
BUN/Creatinine Ratio: 20 (ref 12–28)
BUN: 14 mg/dL (ref 8–27)
Bilirubin Total: <0.2 mg/dL (ref 0.0–1.2)
CO2: 19 mmol/L — ABNORMAL LOW (ref 20–29)
Globulin, Total: 2.1 g/dL (ref 1.5–4.5)
Potassium: 4.1 mmol/L (ref 3.5–5.2)
Sodium: 137 mmol/L (ref 134–144)
Total Protein: 5.6 g/dL — ABNORMAL LOW (ref 6.0–8.5)
eGFR: 96 mL/min/{1.73_m2} (ref >59)

## 2023-03-01 LAB — CBC WITH DIFFERENTIAL/PLATELET
Basophils Absolute: 0 10*3/uL (ref 0.0–0.2)
Eos: 1 %
Hematocrit: 32.5 % — ABNORMAL LOW (ref 34.0–46.6)
Immature Grans (Abs): 0.1 10*3/uL (ref 0.0–0.1)
Lymphocytes Absolute: 1.2 10*3/uL (ref 0.7–3.1)
MCHC: 31.1 g/dL — ABNORMAL LOW (ref 31.5–35.7)
MCV: 90 fL (ref 79–97)
Monocytes Absolute: 0.4 10*3/uL (ref 0.1–0.9)
Monocytes: 5 %
RBC: 3.61 x10E6/uL — ABNORMAL LOW (ref 3.77–5.28)
RDW: 16.5 % — ABNORMAL HIGH (ref 11.7–15.4)

## 2023-03-01 LAB — LIPID PANEL
Chol/HDL Ratio: 1.8 ratio (ref 0.0–4.4)
Cholesterol, Total: 136 mg/dL (ref 100–199)
HDL: 76 mg/dL (ref >39)
LDL Chol Calc (NIH): 47 mg/dL (ref 0–99)
Triglycerides: 59 mg/dL (ref 0–149)
VLDL Cholesterol Cal: 13 mg/dL (ref 5–40)

## 2023-03-01 LAB — TSH+FREE T4: Free T4: 1.76 ng/dL (ref 0.82–1.77)

## 2023-03-10 NOTE — Progress Notes (Signed)
[facility] Quality Team Note  Name: Erica Beasley Date of Birth: [birth year] MRN: [id number] Date: 03/10/2023  [facility] Quality Team has reviewed this patient's chart, please see recommendations below:  [facility] Quality Other; (Called to offer [facility] Eye Event 03/15/2023, left voicemail)

## 2023-03-17 ENCOUNTER — Inpatient Hospital Stay: Payer: 59

## 2023-03-17 DIAGNOSIS — R803 Bence Jones proteinuria: Secondary | ICD-10-CM

## 2023-03-17 DIAGNOSIS — D472 Monoclonal gammopathy: Secondary | ICD-10-CM

## 2023-03-17 DIAGNOSIS — D649 Anemia, unspecified: Secondary | ICD-10-CM | POA: Diagnosis not present

## 2023-03-17 DIAGNOSIS — M810 Age-related osteoporosis without current pathological fracture: Secondary | ICD-10-CM | POA: Diagnosis not present

## 2023-03-17 DIAGNOSIS — D500 Iron deficiency anemia secondary to blood loss (chronic): Secondary | ICD-10-CM

## 2023-03-17 LAB — CBC WITH DIFFERENTIAL/PLATELET
Abs Immature Granulocytes: 0.05 10*3/uL (ref 0.00–0.07)
Basophils Absolute: 0 10*3/uL (ref 0.0–0.1)
Basophils Relative: 0 %
Eosinophils Absolute: 0.2 10*3/uL (ref 0.0–0.5)
Eosinophils Relative: 3 %
HCT: 35.4 % — ABNORMAL LOW (ref 36.0–46.0)
Hemoglobin: 11.1 g/dL — ABNORMAL LOW (ref 12.0–15.0)
Immature Granulocytes: 1 %
Lymphocytes Relative: 33 %
Lymphs Abs: 2.6 10*3/uL (ref 0.7–4.0)
MCH: 29.5 pg (ref 26.0–34.0)
MCHC: 31.4 g/dL (ref 30.0–36.0)
MCV: 94.1 fL (ref 80.0–100.0)
Monocytes Absolute: 0.4 10*3/uL (ref 0.1–1.0)
Monocytes Relative: 6 %
Neutro Abs: 4.5 10*3/uL (ref 1.7–7.7)
Neutrophils Relative %: 57 %
Platelets: 267 10*3/uL (ref 150–400)
RBC: 3.76 MIL/uL — ABNORMAL LOW (ref 3.87–5.11)
RDW: 15.6 % — ABNORMAL HIGH (ref 11.5–15.5)
WBC: 7.9 10*3/uL (ref 4.0–10.5)
nRBC: 0 % (ref 0.0–0.2)

## 2023-03-17 LAB — SAMPLE TO BLOOD BANK

## 2023-03-31 ENCOUNTER — Inpatient Hospital Stay: Payer: 59 | Attending: Hematology

## 2023-03-31 DIAGNOSIS — Z79899 Other long term (current) drug therapy: Secondary | ICD-10-CM | POA: Diagnosis not present

## 2023-03-31 DIAGNOSIS — Z803 Family history of malignant neoplasm of breast: Secondary | ICD-10-CM | POA: Diagnosis not present

## 2023-03-31 DIAGNOSIS — D500 Iron deficiency anemia secondary to blood loss (chronic): Secondary | ICD-10-CM

## 2023-03-31 DIAGNOSIS — R809 Proteinuria, unspecified: Secondary | ICD-10-CM | POA: Insufficient documentation

## 2023-03-31 DIAGNOSIS — R803 Bence Jones proteinuria: Secondary | ICD-10-CM

## 2023-03-31 DIAGNOSIS — M810 Age-related osteoporosis without current pathological fracture: Secondary | ICD-10-CM | POA: Diagnosis not present

## 2023-03-31 DIAGNOSIS — D649 Anemia, unspecified: Secondary | ICD-10-CM | POA: Insufficient documentation

## 2023-03-31 DIAGNOSIS — M069 Rheumatoid arthritis, unspecified: Secondary | ICD-10-CM | POA: Diagnosis not present

## 2023-03-31 DIAGNOSIS — D472 Monoclonal gammopathy: Secondary | ICD-10-CM

## 2023-03-31 DIAGNOSIS — Z87891 Personal history of nicotine dependence: Secondary | ICD-10-CM | POA: Insufficient documentation

## 2023-03-31 DIAGNOSIS — R627 Adult failure to thrive: Secondary | ICD-10-CM | POA: Diagnosis not present

## 2023-03-31 LAB — CBC WITH DIFFERENTIAL/PLATELET
Abs Immature Granulocytes: 0.08 10*3/uL — ABNORMAL HIGH (ref 0.00–0.07)
Basophils Absolute: 0 10*3/uL (ref 0.0–0.1)
Basophils Relative: 0 %
Eosinophils Absolute: 0.1 10*3/uL (ref 0.0–0.5)
Eosinophils Relative: 1 %
HCT: 35 % — ABNORMAL LOW (ref 36.0–46.0)
Hemoglobin: 10.7 g/dL — ABNORMAL LOW (ref 12.0–15.0)
Immature Granulocytes: 1 %
Lymphocytes Relative: 37 %
Lymphs Abs: 3.4 10*3/uL (ref 0.7–4.0)
MCH: 28.8 pg (ref 26.0–34.0)
MCHC: 30.6 g/dL (ref 30.0–36.0)
MCV: 94.1 fL (ref 80.0–100.0)
Monocytes Absolute: 0.7 10*3/uL (ref 0.1–1.0)
Monocytes Relative: 8 %
Neutro Abs: 4.8 10*3/uL (ref 1.7–7.7)
Neutrophils Relative %: 53 %
Platelets: 251 10*3/uL (ref 150–400)
RBC: 3.72 MIL/uL — ABNORMAL LOW (ref 3.87–5.11)
RDW: 15 % (ref 11.5–15.5)
WBC: 9.1 10*3/uL (ref 4.0–10.5)
nRBC: 0 % (ref 0.0–0.2)

## 2023-03-31 LAB — SAMPLE TO BLOOD BANK

## 2023-04-06 DIAGNOSIS — D649 Anemia, unspecified: Secondary | ICD-10-CM | POA: Diagnosis not present

## 2023-04-06 DIAGNOSIS — M1991 Primary osteoarthritis, unspecified site: Secondary | ICD-10-CM | POA: Diagnosis not present

## 2023-04-06 DIAGNOSIS — L4059 Other psoriatic arthropathy: Secondary | ICD-10-CM | POA: Diagnosis not present

## 2023-04-06 DIAGNOSIS — L409 Psoriasis, unspecified: Secondary | ICD-10-CM | POA: Diagnosis not present

## 2023-04-06 DIAGNOSIS — M0579 Rheumatoid arthritis with rheumatoid factor of multiple sites without organ or systems involvement: Secondary | ICD-10-CM | POA: Diagnosis not present

## 2023-04-06 DIAGNOSIS — Z681 Body mass index (BMI) 19 or less, adult: Secondary | ICD-10-CM | POA: Diagnosis not present

## 2023-04-06 DIAGNOSIS — Z79899 Other long term (current) drug therapy: Secondary | ICD-10-CM | POA: Diagnosis not present

## 2023-04-06 DIAGNOSIS — M5136 Other intervertebral disc degeneration, lumbar region: Secondary | ICD-10-CM | POA: Diagnosis not present

## 2023-04-10 ENCOUNTER — Encounter: Payer: Self-pay | Admitting: Family Medicine

## 2023-04-10 ENCOUNTER — Ambulatory Visit: Payer: 59 | Admitting: Family Medicine

## 2023-04-10 VITALS — BP 150/90 | HR 100 | Temp 96.9°F | Ht 64.0 in | Wt 99.6 lb

## 2023-04-10 DIAGNOSIS — H1031 Unspecified acute conjunctivitis, right eye: Secondary | ICD-10-CM

## 2023-04-10 MED ORDER — POLYMYXIN B-TRIMETHOPRIM 10000-0.1 UNIT/ML-% OP SOLN
1.0000 [drp] | Freq: Four times a day (QID) | OPHTHALMIC | 0 refills | Status: AC
Start: 2023-04-10 — End: 2023-04-17

## 2023-04-10 NOTE — Progress Notes (Signed)
   Acute Office Visit  Subjective:     Patient ID: Erica Beasley, female    DOB: [birth year], 67 y.o.   MRN: [id number]  Chief Complaint  Patient presents with   Eye Pain    X 4 days - right eye swelling and painful.  States it drains a little in the morning     Eye Pain  The right eye is affected. This is a new problem. The current episode started in the past 7 days. The problem occurs constantly. The problem has been unchanged. There was no injury mechanism. The pain is moderate. There is No known exposure to pink eye. She Does not wear contacts. Associated symptoms include an eye discharge (worse in the morning), eye redness and photophobia. Pertinent negatives include no blurred vision, double vision, fever, foreign body sensation, itching, nausea, recent URI or vomiting. She has tried eye drops for the symptoms. The treatment provided no relief.     Review of Systems  Constitutional:  Negative for fever.  Eyes:  Positive for photophobia, pain, discharge (worse in the morning) and redness. Negative for blurred vision, double vision and itching.  Gastrointestinal:  Negative for nausea and vomiting.        Objective:    BP (!) 150/90   Pulse 100   Temp (!) 96.9 F (36.1 C) (Temporal)   Ht 5\' 4"  (1.626 m)   Wt 99 lb 9.6 oz (45.2 kg)   SpO2 95%   BMI 17.10 kg/m    Physical Exam Vitals and nursing note reviewed.  Eyes:     General: Lids are normal. Lids are everted, no foreign bodies appreciated. Vision grossly intact.        Right eye: No foreign body, discharge or hordeolum.        Left eye: No foreign body, discharge or hordeolum.     Extraocular Movements:     Right eye: Normal extraocular motion.     Left eye: Normal extraocular motion.     Conjunctiva/sclera:     Right eye: Right conjunctiva is injected. No exudate or hemorrhage.    Left eye: Left conjunctiva is not injected. No exudate or hemorrhage.    Pupils: Pupils are equal, round, and reactive to light.   Pulmonary:     Effort: Pulmonary effort is normal. No respiratory distress.  Musculoskeletal:     Right lower leg: No edema.     Left lower leg: No edema.  Skin:    General: Skin is warm and dry.  Neurological:     Mental Status: She is oriented to person, place, and time. Mental status is at baseline.     No results found for any visits on 04/10/23.      Assessment & Plan:   Zadaya was seen today for eye pain.  Diagnoses and all orders for this visit:  Acute bacterial conjunctivitis of right eye Polytrim as below. Discussed symptomatic care, prevention of spread, and return precautions.  -     trimethoprim-polymyxin b (POLYTRIM) ophthalmic solution; Place 1 drop into the right eye 4 (four) times daily for 7 days.  Return to office for new or worsening symptoms, or if symptoms persist.   The patient indicates understanding of these issues and agrees with the plan.  Gabriel Earing, FNP

## 2023-04-14 ENCOUNTER — Inpatient Hospital Stay: Payer: 59

## 2023-04-14 DIAGNOSIS — M810 Age-related osteoporosis without current pathological fracture: Secondary | ICD-10-CM | POA: Diagnosis not present

## 2023-04-14 DIAGNOSIS — M069 Rheumatoid arthritis, unspecified: Secondary | ICD-10-CM | POA: Diagnosis not present

## 2023-04-14 DIAGNOSIS — R803 Bence Jones proteinuria: Secondary | ICD-10-CM

## 2023-04-14 DIAGNOSIS — Z803 Family history of malignant neoplasm of breast: Secondary | ICD-10-CM | POA: Diagnosis not present

## 2023-04-14 DIAGNOSIS — D649 Anemia, unspecified: Secondary | ICD-10-CM

## 2023-04-14 DIAGNOSIS — R809 Proteinuria, unspecified: Secondary | ICD-10-CM | POA: Diagnosis not present

## 2023-04-14 DIAGNOSIS — Z79899 Other long term (current) drug therapy: Secondary | ICD-10-CM | POA: Diagnosis not present

## 2023-04-14 DIAGNOSIS — Z87891 Personal history of nicotine dependence: Secondary | ICD-10-CM | POA: Diagnosis not present

## 2023-04-14 DIAGNOSIS — D472 Monoclonal gammopathy: Secondary | ICD-10-CM

## 2023-04-14 DIAGNOSIS — D500 Iron deficiency anemia secondary to blood loss (chronic): Secondary | ICD-10-CM

## 2023-04-14 DIAGNOSIS — R627 Adult failure to thrive: Secondary | ICD-10-CM | POA: Diagnosis not present

## 2023-04-14 LAB — CBC WITH DIFFERENTIAL/PLATELET
Abs Immature Granulocytes: 0.03 10*3/uL (ref 0.00–0.07)
Basophils Absolute: 0 10*3/uL (ref 0.0–0.1)
Basophils Relative: 0 %
Eosinophils Absolute: 0.1 10*3/uL (ref 0.0–0.5)
Eosinophils Relative: 2 %
HCT: 38.1 % (ref 36.0–46.0)
Hemoglobin: 12.1 g/dL (ref 12.0–15.0)
Immature Granulocytes: 0 %
Lymphocytes Relative: 24 %
Lymphs Abs: 1.8 10*3/uL (ref 0.7–4.0)
MCH: 30 pg (ref 26.0–34.0)
MCHC: 31.8 g/dL (ref 30.0–36.0)
MCV: 94.5 fL (ref 80.0–100.0)
Monocytes Absolute: 0.4 10*3/uL (ref 0.1–1.0)
Monocytes Relative: 5 %
Neutro Abs: 5.2 10*3/uL (ref 1.7–7.7)
Neutrophils Relative %: 69 %
Platelets: 241 10*3/uL (ref 150–400)
RBC: 4.03 MIL/uL (ref 3.87–5.11)
RDW: 14.3 % (ref 11.5–15.5)
WBC: 7.5 10*3/uL (ref 4.0–10.5)
nRBC: 0 % (ref 0.0–0.2)

## 2023-04-14 LAB — COMPREHENSIVE METABOLIC PANEL
ALT: 23 U/L (ref 0–44)
AST: 18 U/L (ref 15–41)
Albumin: 3.4 g/dL — ABNORMAL LOW (ref 3.5–5.0)
Alkaline Phosphatase: 95 U/L (ref 38–126)
Anion gap: 12 (ref 5–15)
BUN: 19 mg/dL (ref 8–23)
CO2: 20 mmol/L — ABNORMAL LOW (ref 22–32)
Calcium: 9.5 mg/dL (ref 8.9–10.3)
Chloride: 103 mmol/L (ref 98–111)
Creatinine, Ser: 0.92 mg/dL (ref 0.44–1.00)
GFR, Estimated: >60 mL/min (ref >60)
Glucose, Bld: 248 mg/dL — ABNORMAL HIGH (ref 70–99)
Potassium: 3.6 mmol/L (ref 3.5–5.1)
Sodium: 135 mmol/L (ref 135–145)
Total Bilirubin: 0.6 mg/dL (ref 0.3–1.2)
Total Protein: 7.3 g/dL (ref 6.5–8.1)

## 2023-04-14 LAB — IRON AND TIBC
Iron: 72 ug/dL (ref 28–170)
Saturation Ratios: 29 % (ref 10.4–31.8)
TIBC: 252 ug/dL (ref 250–450)
UIBC: 180 ug/dL

## 2023-04-14 LAB — LACTATE DEHYDROGENASE: LDH: 145 U/L (ref 98–192)

## 2023-04-14 LAB — VITAMIN B12: Vitamin B-12: 282 pg/mL (ref 180–914)

## 2023-04-14 LAB — SAMPLE TO BLOOD BANK

## 2023-04-14 LAB — FERRITIN: Ferritin: 1478 ng/mL — ABNORMAL HIGH (ref 11–307)

## 2023-04-17 ENCOUNTER — Other Ambulatory Visit: Payer: 59

## 2023-04-18 ENCOUNTER — Ambulatory Visit (INDEPENDENT_AMBULATORY_CARE_PROVIDER_SITE_OTHER): Payer: 59 | Admitting: Family Medicine

## 2023-04-18 ENCOUNTER — Encounter: Payer: Self-pay | Admitting: Family Medicine

## 2023-04-18 VITALS — BP 143/86 | HR 103 | Temp 97.9°F | Ht 64.0 in | Wt 99.8 lb

## 2023-04-18 DIAGNOSIS — H169 Unspecified keratitis: Secondary | ICD-10-CM | POA: Diagnosis not present

## 2023-04-18 LAB — METHYLMALONIC ACID, SERUM: Methylmalonic Acid, Quantitative: 168 nmol/L (ref 0–378)

## 2023-04-18 MED ORDER — CIPROFLOXACIN HCL 500 MG PO TABS: 500.0000 mg | ORAL_TABLET | Freq: Two times a day (BID) | ORAL | 0 refills | Status: DC

## 2023-04-18 MED ORDER — TOBRAMYCIN-DEXAMETHASONE 0.3-0.1 % OP SUSP: OPHTHALMIC | 0 refills | Status: DC

## 2023-04-18 NOTE — Progress Notes (Signed)
Subjective:  Patient ID: Erica Beasley, female    DOB: 1955-12-19  Age: 67 y.o. MRN: [id number]  CC: Eye Problem (Right eye pain and swelling)   HPI MARLISA CARIDI presents for 12 days of right eye throbbing pain and clear drainage. Can only see a little bit. Light  sensitive so has to keep it closed. Placed on sulfa antibiotic eyedrops at visit here 8 days ago without improvement.      04/18/2023    1:44 PM 04/10/2023    1:27 PM 02/28/2023    3:57 PM  Depression screen PHQ 2/9  Decreased Interest 3 0 3  Down, Depressed, Hopeless 3 0 0  PHQ - 2 Score 6 0 3  Altered sleeping 0 0 0  Tired, decreased energy 3 0 3  Change in appetite 3 0 0  Feeling bad or failure about yourself  0 0 0  Trouble concentrating 0 0 0  Moving slowly or fidgety/restless 0 0 0  Suicidal thoughts 0 0 0  PHQ-9 Score 12 0 6  Difficult doing work/chores Not difficult at all Not difficult at all Not difficult at all    History Natanya has a past medical history of Aortic atherosclerosis (HCC) (11/13/2020), CKD (chronic kidney disease) stage 2, GFR 60-89 ml/min, COPD (chronic obstructive pulmonary disease) (HCC), COVID-19 (06/2019), Depression, Essential hypertension, GERD (gastroesophageal reflux disease), H/O Clostridium difficile infection (05/03/2019), History of diabetes mellitus, colonic polyps (05/03/2019), Hyperlipidemia, Hypothyroidism, Neuropathy, Rheumatoid arthritis (HCC), 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   She has a past surgical history that includes Yag laser application (Left, 07/27/2015); Colonoscopy (09/2010); Colonoscopy with propofol (N/A, 09/16/2019); biopsy (09/16/2019); Esophagogastroduodenoscopy (egd) with propofol (N/A, 04/28/2020); biopsy (04/28/2020); Cataract extraction (Bilateral); Colonoscopy with propofol (N/A, 06/08/2020); biopsy (06/08/2020); and Givens capsule study (N/A, 08/12/2022).   Her family history includes  Breast cancer in her mother; Diabetes in her mother; Heart disease in her mother; Migraines in her daughter; Other in her father; Stroke in her mother.She reports that she quit smoking about 5 years ago. Her smoking use included cigarettes. She started smoking about 20 years ago. She has a 15 pack-year smoking history. She has quit using smokeless tobacco. She reports that she does not drink alcohol and does not use drugs.    ROS Review of Systems  Constitutional: Negative.   HENT: Negative.    Eyes:  Negative for visual disturbance.  Respiratory:  Negative for shortness of breath.   Cardiovascular:  Negative for chest pain.  Gastrointestinal:  Negative for abdominal pain.  Musculoskeletal:  Negative for arthralgias.    Objective:  BP (!) 143/86   Pulse (!) 103   Temp 97.9 F (36.6 C)   Ht 5\' 4"  (1.626 m)   Wt 99 lb 12.8 oz (45.3 kg)   SpO2 98%   BMI 17.13 kg/m   BP Readings from Last 3 Encounters:  04/18/23 (!) 143/86  04/10/23 (!) 150/90  02/28/23 132/72    Wt Readings from Last 3 Encounters:  04/18/23 99 lb 12.8 oz (45.3 kg)  04/10/23 99 lb 9.6 oz (45.2 kg)  02/28/23 98 lb 6.4 oz (44.6 kg)     Physical Exam Constitutional:      Appearance: She is ill-appearing.  HENT:     Head: Normocephalic.     Nose: Nose normal.  Eyes:     General: Lids are everted, no foreign bodies appreciated. Gaze aligned appropriately.  Right eye: Discharge (clear, watering profusely) present. No foreign body.        Left eye: No foreign body.     Extraocular Movements:     Right eye: Normal extraocular motion and no nystagmus.     Conjunctiva/sclera:     Right eye: Right conjunctiva is injected. Chemosis (lower lid) present. No exudate or hemorrhage.    Left eye: Left conjunctiva is not injected. No chemosis. Cardiovascular:     Rate and Rhythm: Normal rate and regular rhythm.  Pulmonary:     Breath sounds: Normal breath sounds.  Neurological:     Mental Status: She is  alert.       Assessment & Plan:   Novia was seen today for eye problem.  Diagnoses and all orders for this visit:  Keratitis due to infection  Other orders -     tobramycin-dexamethasone (TOBRADEX) ophthalmic solution; Apply 1 drop in affected eye(s) every 2 hours for two days. Then every 4 hours for 5 days. -     ciprofloxacin (CIPRO) 500 MG tablet; Take 1 tablet (500 mg total) by mouth 2 (two) times daily.       I am having Praise A. Guillotte start on tobramycin-dexamethasone and ciprofloxacin. I am also having her maintain her hydroxychloroquine, albuterol, calcium carbonate, diclofenac Sodium, aspirin EC, rosuvastatin, potassium chloride SA, gabapentin, levothyroxine, liothyronine, traZODone, folic acid, Blood Glucose Monitoring Suppl, BLOOD GLUCOSE TEST STRIPS, desvenlafaxine, megestrol, and metFORMIN.  Allergies as of 04/18/2023       Reactions   Adalimumab    Other reaction(s): no response Pt is unaware of this Other reaction(s): no response   Methotrexate Other (See Comments)   Other reaction(s): GI side effects   Sulfasalazine    Other Reaction(s): Anemia   Upadacitinib Other (See Comments)   Other reaction(s): GI side effects        Medication List        Accurate as of April 18, 2023  2:25 PM. If you have any questions, ask your nurse or doctor.          albuterol 108 (90 Base) MCG/ACT inhaler Commonly known as: VENTOLIN HFA Inhale 2 puffs into the lungs every 6 (six) hours as needed for wheezing or shortness of breath.   aspirin EC 81 MG tablet Take 1 tablet (81 mg total) by mouth daily. Swallow whole.   Blood Glucose Monitoring Suppl Devi 1 each by Does not apply route in the morning, at noon, and at bedtime. May substitute to any manufacturer covered by patient's insurance.   BLOOD GLUCOSE TEST STRIPS Strp 1 each by In Vitro route 2 (two) times daily before a meal. May substitute to any manufacturer covered by patient's insurance.   calcium  carbonate 1500 (600 Ca) MG Tabs tablet Commonly known as: OSCAL Take 1,500 mg by mouth 2 (two) times daily with a meal.   ciprofloxacin 500 MG tablet Commonly known as: Cipro Take 1 tablet (500 mg total) by mouth 2 (two) times daily. Started by: Luiza Carranco   desvenlafaxine 50 MG 24 hr tablet Commonly known as: Pristiq Take 1 tablet (50 mg total) by mouth daily.   diclofenac Sodium 1 % Gel Commonly known as: Voltaren Apply 4 g topically 4 (four) times daily.   folic acid 1 MG tablet Commonly known as: FOLVITE TAKE ONE TABLET ONCE DAILY   gabapentin 300 MG capsule Commonly known as: NEURONTIN Take 1 capsule (300 mg total) by mouth 3 (three) times daily.   hydroxychloroquine  200 MG tablet Commonly known as: PLAQUENIL Take 200 mg by mouth 2 (two) times daily.   levothyroxine 125 MCG tablet Commonly known as: SYNTHROID Take 1 tablet (125 mcg total) by mouth every morning.   liothyronine 5 MCG tablet Commonly known as: CYTOMEL Take 2 tablets (10 mcg total) by mouth daily.   megestrol 40 MG/ML suspension Commonly known as: MEGACE Take 10 mLs (400 mg total) by mouth 2 (two) times daily.   metFORMIN 500 MG 24 hr tablet Commonly known as: GLUCOPHAGE-XR Take 1 tablet (500 mg total) by mouth 2 (two) times daily with a meal.   potassium chloride SA 20 MEQ tablet Commonly known as: KLOR-CON M TAKE ONE TABLET ONCE DAILY, NEED DOCTOR APPT. What changed: See the new instructions.   rosuvastatin 40 MG tablet Commonly known as: Crestor Take 1 tablet (40 mg total) by mouth daily. For cholesterol   tobramycin-dexamethasone ophthalmic solution Commonly known as: TobraDex Apply 1 drop in affected eye(s) every 2 hours for two days. Then every 4 hours for 5 days. Started by: Rebekkah Powless   traZODone 100 MG tablet Commonly known as: DESYREL Take 1 tablet (100 mg total) by mouth at bedtime as needed. for sleep       Grandson to take her directly to her optometrist for slit  lamp & retina exams  Follow-up: Return if symptoms worsen or fail to improve.  Mechele Claude, M.D.

## 2023-04-19 DIAGNOSIS — I129 Hypertensive chronic kidney disease with stage 1 through stage 4 chronic kidney disease, or unspecified chronic kidney disease: Secondary | ICD-10-CM | POA: Diagnosis not present

## 2023-04-19 DIAGNOSIS — R803 Bence Jones proteinuria: Secondary | ICD-10-CM | POA: Diagnosis not present

## 2023-04-19 DIAGNOSIS — N182 Chronic kidney disease, stage 2 (mild): Secondary | ICD-10-CM | POA: Diagnosis not present

## 2023-04-19 DIAGNOSIS — M0579 Rheumatoid arthritis with rheumatoid factor of multiple sites without organ or systems involvement: Secondary | ICD-10-CM | POA: Diagnosis not present

## 2023-04-19 DIAGNOSIS — I959 Hypotension, unspecified: Secondary | ICD-10-CM | POA: Diagnosis not present

## 2023-04-24 NOTE — Progress Notes (Unsigned)
[facility] [address], [place] [address]   CLINIC:  Medical Oncology/Hematology  PCP:  Mechele Claude, MD [address] [place] [address] [phone]   REASON FOR VISIT:  Follow-up for normocytic anemia  INTERVAL HISTORY:   Erica Beasley 67 y.o. female returns for routine follow-up of her normocytic anemia.  She received Feraheme on 05/18/2022 and 06/17/2022.  She received PRBC x 2 on 12/30/2022 due to Hgb 7.0.  She was last seen by Rojelio Brenner PA-C on 01/18/2023.  After her last visit, we discussed extensively with her rheumatologist Azucena Fallen, PA-C) regarding ongoing anemia.  Following this discussion, patient's leflunomide was discontinued.  Since that time, her blood counts have significantly improved.  She has been feeling about the same.  (She continues to take hydroxychloroquine.)  Her energy has been better now that her anemia has improved, but she has some recurrent fatigue related to joint pain.  She reports that she was eating well for several weeks and was starting to regain some of the weight she lost.  However, she has had an episode of severe eye pain for the past 3 weeks, which is interfering with her appetite.  (She was seen by ophthalmologist, and is returning to their office today due to lack of symptom improvement.)   She continues to deny any bright red blood per rectum, melena, epistaxis, or other signs of blood loss.  She has stable dyspnea on exertion. She denies any pica, restless legs, headaches, lightheadedness, syncope, or chest pain.  No B symptoms such as fever, chills, night sweats, unintentional weight loss.  She has 70% energy and 70% appetite.  Her weight is today is 95 pounds.  BMI remains low at 16.42.  This  ASSESSMENT & PLAN:  1.  Normocytic anemia -  BMBX on 07/19/2021: Mildly hypercellular marrow with trilineage hematopoiesis.  No increased ring sideroblasts or blasts.  3% polytypic plasma cells.  No large aggregates  of plasma cells.  Iron stores increased.  Chromosome analysis normal. - Etiology of patient's anemia remains uncertain.   Likely some element of anemia of chronic disease with functional iron deficiency in the setting of severe rheumatoid arthritis. Myelosuppression from rheumatology medications (leflunomide, hydroxychloroquine, and previously sulfasalazine) is a significant possibility.   Patient has been NEGATIVE for Hemoccult stool on multiple occasions within the past year.  No obvious rectal bleeding or melena.  EGD, colonoscopy, capsule studies did not reveal any source of major blood loss. Bone marrow biopsy was nondiagnostic.  Hemolysis labs and nutritional deficiency work-up have been unremarkable, apart from significant functional iron deficiency (no improvement in hemoglobin after IV iron) No evidence of chronic kidney disease based on patient's creatinine or Cystatin C - She is taking vitamin B12 tablets at home. - Trial of monthly Feraheme x2 doses did NOT improve her hemoglobin.  Iron tablets discontinued due to elevated iron levels. - She has required intermittent blood transfusions, most recently on 12/30/2022 (Hgb 7.0) - Leflunomide was discontinued by rheumatology on 01/18/2023.  Patient's blood count improved significantly after leflunomide was stopped. - She has ongoing fatigue related to her RA symptoms - No bright red blood per rectum or melena - Most recent labs (04/14/2023): Hemoglobin 12.1/MCV 94.5.  CBC/D entirely normal. Normal creatinine 0.92 Ferritin 1478, iron saturation 29% Normal LDH. Marginal vitamin B12 282, normal MMA. - PLAN: No indication for oral iron at this time. - Instructed to RESTART vitamin B12 500 mcg every other day. - Patient educated  on alarm symptoms that would prompt immediate medical attention/ED visit. - Labs and RTC 3 months  2.  Bence-Jones proteinuria -24-hour urine done by Dr. Wolfgang Phoenix showed urine immunofixation positive for Bence-Jones  proteinuria, kappa type.  24-hour total protein was 77 mg/day. -Free kappa light chains were 52.9, lambda light chains 29.8 with ratio of 1.78.  Serum immunofixation was unremarkable.  SPEP did not show any evidence of M spike. -Labs on 04/03/2020 shows negative immunofixation.  Kappa light chain 61.3, lambda light chains 32.3, ratio 1.9. -Skeletal survey on 04/03/2020 was negative for lytic lesions. - Most recent MGUS/myeloma panel (10/04/2022) 24-hour urine/UPEP/UIFE/TP (08/29/2022): Total protein 261 mg/24-hr.  Urine immunofixation normal, M spike not observed.  Mildly elevated urine kappa light chains 140, with normal lambda normal ratio. Serum immunofixation unremarkable, no monoclonal protein SPEP negative for M spike. Mildly elevated serum kappa free light chain 36.4, normal lambda 21.3, mildly elevated ratio 1.71. - No new onset bone pain or neurologic changes  - PLAN: No evidence of plasma cell dyscrasia in blood or urine at this time.  We will recheck MGUS labs and urine studies annually (January 2025)  3.  Failure to thrive with low BMI - Intermittent episodes of poor appetite and weight loss associated with painful flareups of her RA - Weight has been fluctuating from 90 to 100 pounds over the past 6 months - She has been evaluated by nutritionist (phone note 01/30/2023) - EGD on 04/28/2020 with stomach and duodenal biopsy negative. - CT CAP on 05/11/2020 showed long segment colonic thickening likely diverticular disease.  Stable small juxtapleural nodule in the right upper chest benign over 1 year.  Moderate large fat-containing left inguinal hernia.  Emphysema and diverticular calcification. - Colonoscopy on 06/08/2020 shows pancolonic diverticulosis, mild.  Normal-appearing colonic mucosa otherwise.  Random biopsies were negative.  4.  Rheumatoid arthritis - Follows with [facility] Azucena Fallen, [place]) - Leflunomide discontinued in June 2024 due to anemia - She is currently  taking hydroxychloroquine.  Reports that she is "supposed to be starting some infusions"  5.  Osteoporosis - DEXA scan on 01/05/2022 with T score -2.7. - Recommended Prolia but her insurance did not cover it. - She started on Zometa on 02/23/2022. - Normal vitamin D (10/24/2022) at 38.1 - PLAN: Continue annual Zometa for total duration of 3 to 5 years.  Next Zometa due around 02/27/2024.  Continue calcium supplements. - Check calcium and Vitamin D with next appotinment  PLAN SUMMARY:  >> Labs in 3 months = CBC/D, ferritin, iron/TIBC, B12, MMA >> OFFICE visit in 3 months (1 week after labs)    REVIEW OF SYSTEMS:  Review of Systems  Constitutional:  Positive for appetite change and fatigue. Negative for chills, diaphoresis, fever and unexpected weight change.  HENT:   Negative for lump/mass and nosebleeds.        Difficulty chewing  Eyes:  Positive for eye problems.  Respiratory:  Positive for shortness of breath (With exertion, mild/at baseline). Negative for cough and hemoptysis.   Cardiovascular:  Negative for chest pain, leg swelling and palpitations.  Gastrointestinal:  Negative for abdominal pain, blood in stool, constipation, diarrhea, nausea and vomiting.  Genitourinary:  Negative for hematuria.   Musculoskeletal:  Positive for arthralgias and back pain.  Skin: Negative.   Neurological:  Positive for extremity weakness (Residual deficit from CVA). Negative for dizziness, headaches and light-headedness.  Hematological:  Does not bruise/bleed easily.  Psychiatric/Behavioral:  Positive for sleep disturbance.  PHYSICAL EXAM:  ECOG PERFORMANCE STATUS: 1 - Symptomatic but completely ambulatory  Physical Exam Vitals reviewed.  Constitutional:      Appearance: Normal appearance.  Cardiovascular:     Rate and Rhythm: Normal rate and regular rhythm.     Pulses: Normal pulses.     Heart sounds: Normal heart sounds.  Pulmonary:     Effort: Pulmonary effort is normal.     Breath  sounds: Normal breath sounds.  Neurological:     General: No focal deficit present.     Mental Status: She is alert and oriented to person, place, and time.     Cranial Nerves: Cranial nerve deficit (Slight left facial hemiparesis, left-sided tongue deviation) present.     Motor: Weakness (Mild residual left upper extremity weakness following CVA) present.  Psychiatric:        Mood and Affect: Mood normal.        Behavior: Behavior normal.     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 Allena Katz: mild diverticulsis in sigmoid colon.    COLONOSCOPY WITH PROPOFOL N/A 09/16/2019   Rourk: Diverticulosis, random colon biopsies negative for microscopic colitis.   COLONOSCOPY WITH PROPOFOL N/A 06/08/2020   Procedure: COLONOSCOPY WITH PROPOFOL;  Surgeon: Corbin Ade, MD;  [facility];  Service: Endoscopy;  Laterality: N/A;   9:15am   ESOPHAGOGASTRODUODENOSCOPY (EGD) WITH PROPOFOL N/A 04/28/2020   Procedure: ESOPHAGOGASTRODUODENOSCOPY (EGD) WITH PROPOFOL;  Surgeon: Corbin Ade, MD;  [facility];  Service: Endoscopy;  Laterality: N/A;  2:15   GIVENS CAPSULE STUDY N/A 08/12/2022   Procedure: GIVENS CAPSULE STUDY;  Surgeon: Corbin Ade, MD;  [facility]: [facility];  Service: Endoscopy;  Laterality: N/A;   YAG LASER APPLICATION Left 07/27/2015   Procedure: YAG LASER APPLICATION;  Surgeon: Susa Simmonds, MD;  Location: [facility];  Service: Ophthalmology;  Laterality: Left;    SOCIAL HISTORY:  Social History   Socioeconomic History   Marital status: Widowed    Spouse name: Not on file   Number of children: 1   Years of education: Not on file   Highest education level: 12th grade  Occupational History   Occupation: DISABLED  Tobacco Use   Smoking status: Former    Current packs/day: 0.00    Average packs/day: 1  pack/day for 15.0 years (15.0 ttl pk-yrs)    Types: Cigarettes    Start date: 09/11/2002    Quit date: 09/11/2017    Years since quitting: 5.6   Smokeless tobacco: Former  Building services engineer status: Never Used  Substance and Sexual Activity   Alcohol use: Never   Drug use: Never   Sexual activity: Not Currently  Other Topics Concern   Not on file  Social History Narrative   Disabled, widow, lives with daughter and her family. Patient still drives.    Social Determinants of Health   Financial Resource Strain: Low Risk  (12/28/2022)   Overall Financial Resource Strain (CARDIA)    Difficulty of Paying Living Expenses: Not hard at all  Food Insecurity: No Food Insecurity (12/28/2022)   Hunger Vital Sign    Worried About Running Out of Food in the Last Year: Never true    Ran Out of Food in the Last Year: Never true  Transportation Needs: No Transportation Needs (12/28/2022)   PRAPARE - Administrator, Civil Service (Medical): No    Lack of Transportation  (Non-Medical): No  Physical Activity: Insufficiently Active (12/28/2022)   Exercise Vital Sign    Days of Exercise per Week: 1 day    Minutes of Exercise per Session: 30 min  Stress: Stress Concern Present (12/28/2022)   [employer] of Occupational Health - Occupational Stress Questionnaire    Feeling of Stress : To some extent  Social Connections: Socially Isolated (12/28/2022)   Social Connection and Isolation Panel [NHANES]    Frequency of Communication with Friends and Family: More than three times a week    Frequency of Social Gatherings with Friends and Family: Once a week    Attends Religious Services: Never    Database administrator or Organizations: No    Attends Banker Meetings: Never    Marital Status: Widowed  Intimate Partner Violence: Not At Risk (12/13/2022)   Humiliation, Afraid, Rape, and Kick questionnaire    Fear of Current or Ex-Partner: No    Emotionally Abused: No    Physically Abused: No    Sexually Abused: No    FAMILY HISTORY:  Family History  Problem Relation Age of Onset   Stroke Mother    Heart disease Mother    Diabetes Mother    Breast cancer Mother    Other Father        stomach taken out for some reason   Migraines Daughter    Colon cancer Neg Hx     CURRENT MEDICATIONS:  Outpatient Encounter Medications as of 04/25/2023  Medication Sig   albuterol (VENTOLIN HFA) 108 (90 Base) MCG/ACT inhaler Inhale 2 puffs into the lungs every 6 (six) hours as needed for wheezing or shortness of breath.   aspirin EC 81 MG tablet Take 1 tablet (81 mg total) by mouth daily. Swallow whole.   Blood Glucose Monitoring Suppl DEVI 1 each by Does not apply route in the morning, at noon, and at bedtime. May substitute to any manufacturer covered by patient's insurance.   calcium carbonate (OSCAL) 1500 (600 Ca) MG TABS tablet Take 1,500 mg by mouth 2 (two) times daily with a meal.   ciprofloxacin (CIPRO) 500 MG tablet Take 1 tablet (500 mg total) by mouth  2 (two) times daily.   Cyanocobalamin (B-12) 500 MCG TABS Take 500 mcg by mouth every other day.   desvenlafaxine (PRISTIQ) 50 MG 24 hr tablet Take 1 tablet (  50 mg total) by mouth daily.   diclofenac Sodium (VOLTAREN) 1 % GEL Apply 4 g topically 4 (four) times daily.   folic acid (FOLVITE) 1 MG tablet TAKE ONE TABLET ONCE DAILY   gabapentin (NEURONTIN) 300 MG capsule Take 1 capsule (300 mg total) by mouth 3 (three) times daily.   Glucose Blood (BLOOD GLUCOSE TEST STRIPS) STRP 1 each by In Vitro route 2 (two) times daily before a meal. May substitute to any manufacturer covered by patient's insurance.   hydroxychloroquine (PLAQUENIL) 200 MG tablet Take 200 mg by mouth 2 (two) times daily.   levothyroxine (SYNTHROID) 125 MCG tablet Take 1 tablet (125 mcg total) by mouth every morning.   liothyronine (CYTOMEL) 5 MCG tablet Take 2 tablets (10 mcg total) by mouth daily.   megestrol (MEGACE) 40 MG/ML suspension Take 10 mLs (400 mg total) by mouth 2 (two) times daily.   metFORMIN (GLUCOPHAGE-XR) 500 MG 24 hr tablet Take 1 tablet (500 mg total) by mouth 2 (two) times daily with a meal.   potassium chloride SA (KLOR-CON M) 20 MEQ tablet TAKE ONE TABLET ONCE DAILY, NEED DOCTOR APPT. (Patient taking differently: Take 20 mEq by mouth daily.)   rosuvastatin (CRESTOR) 40 MG tablet Take 1 tablet (40 mg total) by mouth daily. For cholesterol   tobramycin-dexamethasone (TOBRADEX) ophthalmic solution Apply 1 drop in affected eye(s) every 2 hours for two days. Then every 4 hours for 5 days.   traZODone (DESYREL) 100 MG tablet Take 1 tablet (100 mg total) by mouth at bedtime as needed. for sleep   No facility-administered encounter medications on file as of 04/25/2023.    ALLERGIES:  Allergies  Allergen Reactions   Adalimumab     Other reaction(s): no response Pt is unaware of this Other reaction(s): no response   Methotrexate Other (See Comments)    Other reaction(s): GI side effects   Sulfasalazine      Other Reaction(s): Anemia   Upadacitinib Other (See Comments)    Other reaction(s): GI side effects    LABORATORY DATA:  I have reviewed the labs as listed.  CBC    Component Value Date/Time   WBC 7.5 04/14/2023 0929   RBC 4.03 04/14/2023 0929   HGB 12.1 04/14/2023 0929   HGB 10.1 (L) 02/28/2023 1600   HCT 38.1 04/14/2023 0929   HCT 32.5 (L) 02/28/2023 1600   PLT 241 04/14/2023 0929   PLT 236 02/28/2023 1600   MCV 94.5 04/14/2023 0929   MCV 90 02/28/2023 1600   MCH 30.0 04/14/2023 0929   MCHC 31.8 04/14/2023 0929   RDW 14.3 04/14/2023 0929   RDW 16.5 (H) 02/28/2023 1600   LYMPHSABS 1.8 04/14/2023 0929   LYMPHSABS 1.2 02/28/2023 1600   MONOABS 0.4 04/14/2023 0929   EOSABS 0.1 04/14/2023 0929   EOSABS 0.1 02/28/2023 1600   BASOSABS 0.0 04/14/2023 0929   BASOSABS 0.0 02/28/2023 1600      Latest Ref Rng & Units 04/14/2023    9:29 AM 02/28/2023    4:00 PM 01/04/2023    4:32 PM  CMP  Glucose 70 - 99 mg/dL 284  132  440   BUN 8 - 23 mg/dL 19  14  8    Creatinine 0.44 - 1.00 mg/dL 1.02  7.25  3.66   Sodium 135 - 145 mmol/L 135  137  135   Potassium 3.5 - 5.1 mmol/L 3.6  4.1  3.5   Chloride 98 - 111 mmol/L 103  105  104  CO2 22 - 32 mmol/L 20  19  22    Calcium 8.9 - 10.3 mg/dL 9.5  8.1  8.6   Total Protein 6.5 - 8.1 g/dL 7.3  5.6  6.2   Total Bilirubin 0.3 - 1.2 mg/dL 0.6  <8.2  0.4   Alkaline Phos 38 - 126 U/L 95  162  85   AST 15 - 41 U/L 18  28  12    ALT 0 - 44 U/L 23  38  8     DIAGNOSTIC IMAGING:  I have independently reviewed the relevant imaging and discussed with the patient.   WRAP UP:  All questions were answered. The patient knows to call the clinic with any problems, questions or concerns.  Medical decision making: Moderate  Time spent on visit: I spent 20 minutes counseling the patient face to face. The total time spent in the appointment was 30 minutes and more than 50% was on counseling.  Carnella Guadalajara, PA-C  04/25/2023 2:19 PM

## 2023-04-25 ENCOUNTER — Encounter: Payer: Self-pay | Admitting: Family Medicine

## 2023-04-25 ENCOUNTER — Inpatient Hospital Stay (HOSPITAL_BASED_OUTPATIENT_CLINIC_OR_DEPARTMENT_OTHER): Payer: 59 | Admitting: Physician Assistant

## 2023-04-25 VITALS — BP 137/94 | HR 94 | Temp 98.1°F | Resp 18 | Wt 95.7 lb

## 2023-04-25 DIAGNOSIS — E538 Deficiency of other specified B group vitamins: Secondary | ICD-10-CM | POA: Diagnosis not present

## 2023-04-25 DIAGNOSIS — D649 Anemia, unspecified: Secondary | ICD-10-CM

## 2023-04-25 DIAGNOSIS — Z803 Family history of malignant neoplasm of breast: Secondary | ICD-10-CM | POA: Diagnosis not present

## 2023-04-25 DIAGNOSIS — R809 Proteinuria, unspecified: Secondary | ICD-10-CM | POA: Diagnosis not present

## 2023-04-25 DIAGNOSIS — M069 Rheumatoid arthritis, unspecified: Secondary | ICD-10-CM | POA: Diagnosis not present

## 2023-04-25 DIAGNOSIS — Z79899 Other long term (current) drug therapy: Secondary | ICD-10-CM | POA: Diagnosis not present

## 2023-04-25 DIAGNOSIS — R627 Adult failure to thrive: Secondary | ICD-10-CM | POA: Diagnosis not present

## 2023-04-25 DIAGNOSIS — Z87891 Personal history of nicotine dependence: Secondary | ICD-10-CM | POA: Diagnosis not present

## 2023-04-25 DIAGNOSIS — M810 Age-related osteoporosis without current pathological fracture: Secondary | ICD-10-CM | POA: Diagnosis not present

## 2023-04-25 MED ORDER — B-12 500 MCG PO TABS
500.0000 ug | ORAL_TABLET | ORAL | 1 refills | Status: AC
Start: 2023-04-25 — End: ?

## 2023-04-25 NOTE — Patient Instructions (Signed)
[facility] at [facility] **VISIT SUMMARY & IMPORTANT INSTRUCTIONS **   You were seen today by Rojelio Brenner PA-C for your anemia.    ANEMIA: Your blood counts look much better after your leflunomide was stopped!  I suspect that this medication was the main reason that you are staying anemic.  You may still continue to have some mild anemia in the future related to your chronic inflammation from your rheumatoid arthritis.   You do not need any iron supplement at this time. Please RESTART your vitamin B-12 500 mcg every other day.  (Prescription sent to your pharmacy) We will see you for follow-up visit in 3 months.  ** Please continue to work on eating plenty of calories, even when you are in pain.  It is important that you do not lose any more weight.  ** Thank you for trusting me with your healthcare!  I strive to provide all of my patients with quality care at each visit.  If you receive a survey for this visit, I would be so grateful to you for taking the time to provide feedback.  Thank you in advance!  ~ Levie Owensby                   Dr. Doreatha Massed   &   Rojelio Brenner, PA-C   - - - - - - - - - - - - - - - - - -    Thank you for choosing [facility] at [facility] to provide your oncology and hematology care.  To afford each patient quality time with our provider, please arrive at least 15 minutes before your scheduled appointment time.   If you have a lab appointment with the Cancer Center please come in thru the Main Entrance and check in at the main information desk.  You need to re-schedule your appointment should you arrive 10 or more minutes late.  We strive to give you quality time with our providers, and arriving late affects you and other patients whose appointments are after yours.  Also, if you no show three or more times for appointments you may be dismissed from the clinic at the providers discretion.     Again,  thank you for choosing [facility].  Our hope is that these requests will decrease the amount of time that you wait before being seen by our physicians.       _____________________________________________________________  Should you have questions after your visit to [facility], please contact our office at [phone] and follow the prompts.  Our office hours are 8:00 a.m. and 4:30 p.m. Monday - Friday.  Please note that voicemails left after 4:00 p.m. may not be returned until the following business day.  We are closed weekends and major holidays.  You do have access to a nurse 24-7, just call the main number to the clinic [phone] and do not press any options, hold on the line and a nurse will answer the phone.    For prescription refill requests, have your pharmacy contact our office and allow 72 hours.

## 2023-04-26 ENCOUNTER — Ambulatory Visit: Payer: 59 | Admitting: Family Medicine

## 2023-05-04 DIAGNOSIS — H01002 Unspecified blepharitis right lower eyelid: Secondary | ICD-10-CM | POA: Diagnosis not present

## 2023-05-04 DIAGNOSIS — H01001 Unspecified blepharitis right upper eyelid: Secondary | ICD-10-CM | POA: Diagnosis not present

## 2023-05-04 DIAGNOSIS — H40051 Ocular hypertension, right eye: Secondary | ICD-10-CM | POA: Diagnosis not present

## 2023-05-08 ENCOUNTER — Other Ambulatory Visit: Payer: Self-pay | Admitting: Hematology

## 2023-05-08 DIAGNOSIS — H401330 Pigmentary glaucoma, bilateral, stage unspecified: Secondary | ICD-10-CM | POA: Diagnosis not present

## 2023-05-08 DIAGNOSIS — H01002 Unspecified blepharitis right lower eyelid: Secondary | ICD-10-CM | POA: Diagnosis not present

## 2023-05-08 DIAGNOSIS — E876 Hypokalemia: Secondary | ICD-10-CM

## 2023-05-08 DIAGNOSIS — H40051 Ocular hypertension, right eye: Secondary | ICD-10-CM | POA: Diagnosis not present

## 2023-05-08 DIAGNOSIS — H01001 Unspecified blepharitis right upper eyelid: Secondary | ICD-10-CM | POA: Diagnosis not present

## 2023-05-15 DIAGNOSIS — H01002 Unspecified blepharitis right lower eyelid: Secondary | ICD-10-CM | POA: Diagnosis not present

## 2023-05-15 DIAGNOSIS — H01001 Unspecified blepharitis right upper eyelid: Secondary | ICD-10-CM | POA: Diagnosis not present

## 2023-05-15 DIAGNOSIS — H40051 Ocular hypertension, right eye: Secondary | ICD-10-CM | POA: Diagnosis not present

## 2023-05-15 DIAGNOSIS — H401330 Pigmentary glaucoma, bilateral, stage unspecified: Secondary | ICD-10-CM | POA: Diagnosis not present

## 2023-05-17 ENCOUNTER — Encounter: Payer: Self-pay | Admitting: Internal Medicine

## 2023-05-17 DIAGNOSIS — M0579 Rheumatoid arthritis with rheumatoid factor of multiple sites without organ or systems involvement: Secondary | ICD-10-CM | POA: Diagnosis not present

## 2023-05-24 ENCOUNTER — Ambulatory Visit (INDEPENDENT_AMBULATORY_CARE_PROVIDER_SITE_OTHER): Payer: 59 | Admitting: Family Medicine

## 2023-05-24 ENCOUNTER — Encounter: Payer: Self-pay | Admitting: Family Medicine

## 2023-05-24 VITALS — BP 134/74 | HR 64 | Temp 98.2°F | Ht 64.0 in | Wt 96.5 lb

## 2023-05-24 DIAGNOSIS — M79675 Pain in left toe(s): Secondary | ICD-10-CM | POA: Diagnosis not present

## 2023-05-24 DIAGNOSIS — M7989 Other specified soft tissue disorders: Secondary | ICD-10-CM | POA: Diagnosis not present

## 2023-05-24 MED ORDER — DOXYCYCLINE HYCLATE 100 MG PO TABS
100.0000 mg | ORAL_TABLET | Freq: Two times a day (BID) | ORAL | 0 refills | Status: AC
Start: 2023-05-24 — End: 2023-05-31

## 2023-05-24 NOTE — Progress Notes (Signed)
   Acute Office Visit  Subjective:     Patient ID: Erica Beasley, female    DOB: 1956-08-21, 67 y.o.   MRN: [id number]  Chief Complaint  Patient presents with   Toe Pain    HPI Patient is in today for left great toe pain for the last week. This started gradually and has been unchanged. Joint of toe is red, tender, and swollen. She denies injury, fever, chills, exudate, drainage. ROM is intact. She has been doing epsom salt soaks without improvement. Hx of T2DM and RA. On voltaren and prednisone daily. Not established with podiatry. Denies hx of gout.   ROS As per HPI.      Objective:    BP 134/74   Pulse 64   Temp 98.2 F (36.8 C) (Temporal)   Ht 5\' 4"  (1.626 m)   Wt 96 lb 8 oz (43.8 kg)   SpO2 100%   BMI 16.56 kg/m    Physical Exam Vitals and nursing note reviewed.  Constitutional:      General: She is not in acute distress.    Appearance: She is not ill-appearing, toxic-appearing or diaphoretic.  Pulmonary:     Effort: Pulmonary effort is normal. No respiratory distress.  Feet:     Comments: Swelling, tenderness, and erythema of left great DP joint. ROM and sensation intact. Thick toenails. No exudate.  Skin:    General: Skin is warm and dry.  Neurological:     Mental Status: She is alert and oriented to person, place, and time. Mental status is at baseline.     No results found for any visits on 05/24/23.      Assessment & Plan:   [name] was seen today for toe pain.  Diagnoses and all orders for this visit:  Pain and swelling of toe of left foot ? Gout vs cellulitis. Will treat empirically for infection with doxycyline as she does not have a hx of gout and does have a hx of T2DM. UC and CBC pending. She is on prednisone and diclofenac sodium chronically. Strict return precautions given. Will notify of results when available and any changes to plan of care.  -     Uric Acid -     CBC with Differential/Platelet -     doxycycline (VIBRA-TABS) 100 MG tablet;  Take 1 tablet (100 mg total) by mouth 2 (two) times daily for 7 days.  Return to office for new or worsening symptoms, or if symptoms persist.   The patient indicates understanding of these issues and agrees with the plan.  Erica Earing, FNP

## 2023-05-25 DIAGNOSIS — H401330 Pigmentary glaucoma, bilateral, stage unspecified: Secondary | ICD-10-CM | POA: Diagnosis not present

## 2023-05-25 DIAGNOSIS — H01001 Unspecified blepharitis right upper eyelid: Secondary | ICD-10-CM | POA: Diagnosis not present

## 2023-05-25 DIAGNOSIS — H01002 Unspecified blepharitis right lower eyelid: Secondary | ICD-10-CM | POA: Diagnosis not present

## 2023-05-25 DIAGNOSIS — H40051 Ocular hypertension, right eye: Secondary | ICD-10-CM | POA: Diagnosis not present

## 2023-05-25 LAB — CBC WITH DIFFERENTIAL/PLATELET
Basophils Absolute: 0 10*3/uL (ref 0.0–0.2)
Basos: 0 %
EOS (ABSOLUTE): 0.1 10*3/uL (ref 0.0–0.4)
Eos: 1 %
Hematocrit: 35 % (ref 34.0–46.6)
Hemoglobin: 11.3 g/dL (ref 11.1–15.9)
Immature Grans (Abs): 0 10*3/uL (ref 0.0–0.1)
Immature Granulocytes: 1 %
Lymphocytes Absolute: 2.1 10*3/uL (ref 0.7–3.1)
Lymphs: 30 %
MCH: 30.4 pg (ref 26.6–33.0)
MCHC: 32.3 g/dL (ref 31.5–35.7)
MCV: 94 fL (ref 79–97)
Monocytes Absolute: 0.6 10*3/uL (ref 0.1–0.9)
Monocytes: 8 %
Neutrophils Absolute: 4.2 10*3/uL (ref 1.4–7.0)
Neutrophils: 60 %
Platelets: 202 10*3/uL (ref 150–450)
RBC: 3.72 x10E6/uL — ABNORMAL LOW (ref 3.77–5.28)
RDW: 13.6 % (ref 11.7–15.4)
WBC: 7 10*3/uL (ref 3.4–10.8)

## 2023-05-25 LAB — URIC ACID: Uric Acid: 2.2 mg/dL — ABNORMAL LOW (ref 3.0–7.2)

## 2023-05-31 ENCOUNTER — Ambulatory Visit: Payer: 59 | Admitting: Family Medicine

## 2023-06-01 ENCOUNTER — Encounter: Payer: Self-pay | Admitting: Family Medicine

## 2023-06-01 ENCOUNTER — Ambulatory Visit (INDEPENDENT_AMBULATORY_CARE_PROVIDER_SITE_OTHER): Payer: 59 | Admitting: Family Medicine

## 2023-06-01 VITALS — BP 160/95 | HR 80 | Temp 97.9°F | Ht 64.0 in | Wt 95.1 lb

## 2023-06-01 DIAGNOSIS — M25521 Pain in right elbow: Secondary | ICD-10-CM

## 2023-06-01 DIAGNOSIS — M25522 Pain in left elbow: Secondary | ICD-10-CM | POA: Diagnosis not present

## 2023-06-01 DIAGNOSIS — W19XXXA Unspecified fall, initial encounter: Secondary | ICD-10-CM | POA: Diagnosis not present

## 2023-06-01 DIAGNOSIS — R0781 Pleurodynia: Secondary | ICD-10-CM | POA: Diagnosis not present

## 2023-06-01 DIAGNOSIS — M25562 Pain in left knee: Secondary | ICD-10-CM

## 2023-06-01 DIAGNOSIS — G8911 Acute pain due to trauma: Secondary | ICD-10-CM

## 2023-06-01 DIAGNOSIS — M25511 Pain in right shoulder: Secondary | ICD-10-CM

## 2023-06-01 MED ORDER — TRAMADOL HCL 50 MG PO TABS: 50.0000 mg | ORAL_TABLET | Freq: Four times a day (QID) | ORAL | 0 refills | Status: AC | PRN

## 2023-06-01 NOTE — Progress Notes (Signed)
Acute Office Visit  Subjective:     Patient ID: Erica Beasley, female    DOB: 08/24/1956, 67 y.o.   MRN: [id number]  Chief Complaint  Patient presents with   Fall    Fall The accident occurred 2 days ago. The fall occurred from a bed (rolled out of bed onto floor). Distance fallen: 2-3 feet. She landed on [place]. There was no blood loss. The point of impact was the left shoulder, left elbow, right shoulder, right elbow and left knee (ribs on left side). The pain is present in the right elbow, left elbow, left shoulder, left knee and right shoulder (left rib). The pain is at a severity of 7/10 (right shoulder hurts the most). The symptoms are aggravated by movement. Pertinent negatives include no bowel incontinence, fever, headaches, hematuria, loss of consciousness, nausea, numbness, tingling, visual change or vomiting. She has tried acetaminophen (left over hydrocodone) for the symptoms.   NSAIDs contraindicated. No relief with tylenol. Left over hydrocodone provided moderate relief.   Review of Systems  Constitutional:  Negative for fever.  Gastrointestinal:  Negative for bowel incontinence, nausea and vomiting.  Genitourinary:  Negative for hematuria.  Neurological:  Negative for tingling, loss of consciousness, numbness and headaches.        Objective:    BP (!) 160/95   Pulse 80   Temp 97.9 F (36.6 C) (Temporal)   Ht 5\' 4"  (1.626 m)   Wt 95 lb 2 oz (43.1 kg)   SpO2 100%   BMI 16.33 kg/m    Physical Exam Vitals and nursing note reviewed.  Constitutional:      General: She is not in acute distress.    Appearance: She is not ill-appearing, toxic-appearing or diaphoretic.  Eyes:     Extraocular Movements: Extraocular movements intact.     Pupils: Pupils are equal, round, and reactive to light.  Cardiovascular:     Rate and Rhythm: Normal rate and regular rhythm.     Heart sounds: Normal heart sounds. No murmur heard. Pulmonary:     Effort: Pulmonary effort is  normal. No respiratory distress.     Breath sounds: Normal breath sounds. No wheezing or rhonchi.  Chest:     Chest wall: Tenderness (anterior lower left ribs) present.  Musculoskeletal:     Right shoulder: Tenderness present. No swelling, deformity, effusion or bony tenderness. Normal range of motion. Normal strength.     Left shoulder: No swelling, deformity, effusion, tenderness or bony tenderness. Normal range of motion. Normal strength.     Right upper arm: Tenderness present. No swelling, edema, deformity or bony tenderness.     Left upper arm: No swelling, edema, deformity, tenderness or bony tenderness.     Right elbow: No swelling, deformity or effusion. Normal range of motion. No tenderness.     Left elbow: No swelling, deformity or effusion. Normal range of motion. No tenderness.     Cervical back: Neck supple. No swelling, deformity, rigidity or tenderness. Normal range of motion.     Thoracic back: No swelling, deformity, tenderness or bony tenderness.     Lumbar back: No swelling, deformity, tenderness or bony tenderness.     Left knee: Laceration (small abrasion to lateral lower knee) present. No swelling, deformity, effusion or bony tenderness. Normal range of motion. Normal alignment and normal patellar mobility.     Instability Tests: Medial McMurray test negative and lateral McMurray test negative.     Right lower leg: No edema.  Left lower leg: No edema.  Skin:    General: Skin is warm and dry.  Neurological:     Mental Status: She is alert and oriented to person, place, and time. Mental status is at baseline.     Motor: No weakness.     Coordination: Coordination normal.     Gait: Gait abnormal (antalgic).  Psychiatric:        Mood and Affect: Mood normal.        Behavior: Behavior normal.     No results found for any visits on 06/01/23.      Assessment & Plan:   Erica Beasley was seen today for fall.  Diagnoses and all orders for this visit:  Fall, initial  encounter -     traMADol (ULTRAM) 50 MG tablet; Take 1 tablet (50 mg total) by mouth every 6 (six) hours as needed for up to 3 days for moderate pain or severe pain.  Acute pain of left knee -     traMADol (ULTRAM) 50 MG tablet; Take 1 tablet (50 mg total) by mouth every 6 (six) hours as needed for up to 3 days for moderate pain or severe pain.  Acute pain of right shoulder due to trauma -     traMADol (ULTRAM) 50 MG tablet; Take 1 tablet (50 mg total) by mouth every 6 (six) hours as needed for up to 3 days for moderate pain or severe pain.  Acute pain of right shoulder -     traMADol (ULTRAM) 50 MG tablet; Take 1 tablet (50 mg total) by mouth every 6 (six) hours as needed for up to 3 days for moderate pain or severe pain.  Elbow pain, left -     traMADol (ULTRAM) 50 MG tablet; Take 1 tablet (50 mg total) by mouth every 6 (six) hours as needed for up to 3 days for moderate pain or severe pain.  Elbow pain, right -     traMADol (ULTRAM) 50 MG tablet; Take 1 tablet (50 mg total) by mouth every 6 (six) hours as needed for up to 3 days for moderate pain or severe pain.  Rib pain -     traMADol (ULTRAM) 50 MG tablet; Take 1 tablet (50 mg total) by mouth every 6 (six) hours as needed for up to 3 days for moderate pain or severe pain.   Fall 2 days ago, No head injury or LOC. No xray available today. No obvious deformity, swelling, or bony tenderness on exam with full ROM intact.  PDMP reviewed, no red flag. Tramadol prn for moderate to severe pain. Aware to use sparingly as there will not be refills provided. Discussed heat, ice, elevation. Discussed to notify for increase pain or no improvement and I will order xrays for further evaluation.   The patient indicates understanding of these issues and agrees with the plan.  Gabriel Earing, FNP

## 2023-06-05 ENCOUNTER — Telehealth: Payer: 59 | Admitting: Physician Assistant

## 2023-06-05 DIAGNOSIS — J069 Acute upper respiratory infection, unspecified: Secondary | ICD-10-CM | POA: Diagnosis not present

## 2023-06-05 MED ORDER — NAPROXEN 500 MG PO TABS
500.0000 mg | ORAL_TABLET | Freq: Two times a day (BID) | ORAL | 0 refills | Status: DC
Start: 2023-06-05 — End: 2023-07-24

## 2023-06-05 MED ORDER — FLUTICASONE PROPIONATE 50 MCG/ACT NA SUSP: 2.0000 | Freq: Every day | NASAL | 0 refills | Status: DC

## 2023-06-05 MED ORDER — BENZONATATE 100 MG PO CAPS
100.0000 mg | ORAL_CAPSULE | Freq: Three times a day (TID) | ORAL | 0 refills | Status: DC | PRN
Start: 2023-06-05 — End: 2023-07-24

## 2023-06-05 NOTE — Progress Notes (Signed)
 E-Visit for [employer] Virus / COVID Screening  Your current symptoms could be consistent with COVID.  Please complete a Covid test either at home or check with your local pharmacy to see if they provide testing.    You have tested positive for COVID-19, meaning that you were infected with the novel coronavirus and could give the virus to others.  Most people with COVID-19 have mild illness and can recover at home without medical care. Do not leave your home, except to get medical care. Do not visit public areas and do not go to places where you are unable to wear a mask. It is important that you stay home  to take care for yourself and to help protect other people in your home and community.      Isolation Instructions:   You are to isolate at home until you have been fever free for at least 24 hours without a fever-reducing medication, and symptoms have been steadily improving for 24 hours. At that time,  you can end isolation but need to mask for an additional 5 days.  If you must be around other household members who do not have symptoms, you need to make sure that both you and the family members are masking consistently with a high-quality mask.  If you note any worsening of symptoms despite treatment, please seek an in-person evaluation ASAP. If you note any significant shortness of breath or any chest pain, please seek ER evaluation. Please do not delay care!  Go to the nearest hospital ED for assessment if fever/cough/breathlessness are severe or illness seems like a threat to life.    The following symptoms may appear 2-14 days after exposure: Fever Cough Shortness of breath or difficulty breathing Chills Repeated shaking with chills Muscle pain Headache Sore throat New loss of taste or smell Fatigue Congestion or runny nose Nausea or vomiting Diarrhea  You can use medication such as prescription cough medication called Tessalon Perles 100 mg. You may take 1-2 capsules every 8  hours as needed for cough, prescription anti-inflammatory called Naprosyn 500 mg. Take twice daily as needed for fever or body aches for 2 weeks, and prescription for Fluticasone nasal spray 2 sprays in each nostril one time per day  You may also take acetaminophen (Tylenol) as needed for fever.  HOME CARE Only take medications as instructed by your medical team. Drink plenty of fluids and get plenty of rest. A steam or ultrasonic humidifier can help if you have congestion.  GET HELP RIGHT AWAY IF YOU HAVE EMERGENCY WARNING SIGNS.  Call 911 or proceed to your closest emergency facility if: You develop worsening high fever. Trouble breathing Bluish lips or face Persistent pain or pressure in the chest New confusion Inability to wake or stay awake You cough up blood. Your symptoms become more severe Inability to hold down food or fluids  This list is not all possible symptoms. Contact your medical provider for any symptoms that are severe or concerning to you.   Your e-visit answers were reviewed by a board certified advanced clinical practitioner to complete your personal care plan.  Depending on the condition, your plan could have included both over the counter or prescription medications.  If there is a problem, please reply once you have received a response from your provider.  Your safety is important to [place].  If you have drug allergies check your prescription carefully.    You can use MyChart to ask questions about today's visit, request a  non-urgent call back, or ask for a work or school excuse for 24 hours related to this e-Visit. If it has been greater than 24 hours you will need to follow up with your provider or enter a new e-Visit to address those concerns. You will get an e-mail in the next two days asking about your experience.  I hope that your e-visit has been valuable and will speed your recovery. Thank you for using e-visits.    I have spent 5 minutes in review of  e-visit questionnaire, review and updating patient chart, medical decision making and response to patient.   Margaretann Loveless, PA-C

## 2023-06-14 ENCOUNTER — Encounter: Payer: Self-pay | Admitting: Family Medicine

## 2023-06-14 ENCOUNTER — Ambulatory Visit (INDEPENDENT_AMBULATORY_CARE_PROVIDER_SITE_OTHER): Payer: 59 | Admitting: Family Medicine

## 2023-06-14 VITALS — BP 135/89 | HR 80 | Temp 97.8°F | Ht 64.0 in | Wt 94.8 lb

## 2023-06-14 DIAGNOSIS — U071 COVID-19: Secondary | ICD-10-CM

## 2023-06-14 MED ORDER — BETAMETHASONE SOD PHOS & ACET 6 (3-3) MG/ML IJ SUSP
6.0000 mg | Freq: Once | INTRAMUSCULAR | Status: AC
Start: 2023-06-14 — End: 2023-06-14
  Administered 2023-06-14: 6 mg via INTRAMUSCULAR

## 2023-06-14 NOTE — Progress Notes (Signed)
Chief Complaint  Patient presents with   Nasal Congestion    HPI  Patient presents today for follow up of Covid. Dx on 99/24.  Patient presents with upper respiratory congestion. Rhinorrhea that is clear. There is nosore throat. Patient reports  no longer coughing .  No sputum noted. There is no fever, chills or sweats. The patient denies being short of breath except sometimes when she lays down.  PMH: Smoking status noted ROS: Per HPI  Objective: BP 135/89   Pulse 80   Temp 97.8 F (36.6 C)   Ht 5\' 4"  (1.626 m)   Wt 94 lb 12.8 oz (43 kg)   SpO2 98%   BMI 16.27 kg/m  Gen: NAD, alert, cooperative with exam HEENT: NCAT, Nasal passages swollen, red TMS clear CV: RRR, good S1/S2, no murmur Resp: CTA Ext: No edema, warm Neuro: Alert and oriented, No gross deficits  Assessment and plan:  1. COVID-19 virus infection     Meds ordered this encounter  Medications   betamethasone acetate-betamethasone sodium phosphate (CELESTONE) injection 6 mg      Follow up as needed.  Mechele Claude, MD

## 2023-06-15 DIAGNOSIS — M0579 Rheumatoid arthritis with rheumatoid factor of multiple sites without organ or systems involvement: Secondary | ICD-10-CM | POA: Diagnosis not present

## 2023-06-21 DIAGNOSIS — M5136 Other intervertebral disc degeneration, lumbar region: Secondary | ICD-10-CM | POA: Diagnosis not present

## 2023-06-21 DIAGNOSIS — Z681 Body mass index (BMI) 19 or less, adult: Secondary | ICD-10-CM | POA: Diagnosis not present

## 2023-06-21 DIAGNOSIS — M1991 Primary osteoarthritis, unspecified site: Secondary | ICD-10-CM | POA: Diagnosis not present

## 2023-06-21 DIAGNOSIS — L409 Psoriasis, unspecified: Secondary | ICD-10-CM | POA: Diagnosis not present

## 2023-06-21 DIAGNOSIS — M79676 Pain in unspecified toe(s): Secondary | ICD-10-CM | POA: Diagnosis not present

## 2023-06-21 DIAGNOSIS — L4059 Other psoriatic arthropathy: Secondary | ICD-10-CM | POA: Diagnosis not present

## 2023-06-21 DIAGNOSIS — Z79899 Other long term (current) drug therapy: Secondary | ICD-10-CM | POA: Diagnosis not present

## 2023-06-21 DIAGNOSIS — M0579 Rheumatoid arthritis with rheumatoid factor of multiple sites without organ or systems involvement: Secondary | ICD-10-CM | POA: Diagnosis not present

## 2023-06-21 DIAGNOSIS — D649 Anemia, unspecified: Secondary | ICD-10-CM | POA: Diagnosis not present

## 2023-06-26 DIAGNOSIS — H401330 Pigmentary glaucoma, bilateral, stage unspecified: Secondary | ICD-10-CM | POA: Diagnosis not present

## 2023-06-26 DIAGNOSIS — H40051 Ocular hypertension, right eye: Secondary | ICD-10-CM | POA: Diagnosis not present

## 2023-06-26 DIAGNOSIS — H26491 Other secondary cataract, right eye: Secondary | ICD-10-CM | POA: Diagnosis not present

## 2023-06-27 ENCOUNTER — Other Ambulatory Visit: Payer: 59

## 2023-06-28 ENCOUNTER — Inpatient Hospital Stay: Payer: 59 | Attending: Hematology

## 2023-06-28 DIAGNOSIS — R627 Adult failure to thrive: Secondary | ICD-10-CM | POA: Diagnosis not present

## 2023-06-28 DIAGNOSIS — R803 Bence Jones proteinuria: Secondary | ICD-10-CM | POA: Diagnosis not present

## 2023-06-28 DIAGNOSIS — D649 Anemia, unspecified: Secondary | ICD-10-CM | POA: Diagnosis not present

## 2023-06-28 DIAGNOSIS — M810 Age-related osteoporosis without current pathological fracture: Secondary | ICD-10-CM | POA: Insufficient documentation

## 2023-06-28 DIAGNOSIS — M069 Rheumatoid arthritis, unspecified: Secondary | ICD-10-CM | POA: Insufficient documentation

## 2023-06-28 DIAGNOSIS — Z803 Family history of malignant neoplasm of breast: Secondary | ICD-10-CM | POA: Diagnosis not present

## 2023-06-28 DIAGNOSIS — Z87891 Personal history of nicotine dependence: Secondary | ICD-10-CM | POA: Insufficient documentation

## 2023-06-28 DIAGNOSIS — Z681 Body mass index (BMI) 19 or less, adult: Secondary | ICD-10-CM | POA: Insufficient documentation

## 2023-06-28 DIAGNOSIS — E538 Deficiency of other specified B group vitamins: Secondary | ICD-10-CM

## 2023-06-28 LAB — IRON AND TIBC
Iron: 90 ug/dL (ref 28–170)
Saturation Ratios: 46 % — ABNORMAL HIGH (ref 10.4–31.8)
TIBC: 195 ug/dL — ABNORMAL LOW (ref 250–450)
UIBC: 105 ug/dL

## 2023-06-28 LAB — CBC WITH DIFFERENTIAL/PLATELET
Abs Immature Granulocytes: 0.03 10*3/uL (ref 0.00–0.07)
Basophils Absolute: 0 10*3/uL (ref 0.0–0.1)
Basophils Relative: 0 %
Eosinophils Absolute: 0.1 10*3/uL (ref 0.0–0.5)
Eosinophils Relative: 1 %
HCT: 27.3 % — ABNORMAL LOW (ref 36.0–46.0)
Hemoglobin: 8.7 g/dL — ABNORMAL LOW (ref 12.0–15.0)
Immature Granulocytes: 1 %
Lymphocytes Relative: 16 %
Lymphs Abs: 0.9 10*3/uL (ref 0.7–4.0)
MCH: 30.2 pg (ref 26.0–34.0)
MCHC: 31.9 g/dL (ref 30.0–36.0)
MCV: 94.8 fL (ref 80.0–100.0)
Monocytes Absolute: 0.3 10*3/uL (ref 0.1–1.0)
Monocytes Relative: 5 %
Neutro Abs: 4.7 10*3/uL (ref 1.7–7.7)
Neutrophils Relative %: 77 %
Platelets: 244 10*3/uL (ref 150–400)
RBC: 2.88 MIL/uL — ABNORMAL LOW (ref 3.87–5.11)
RDW: 13.7 % (ref 11.5–15.5)
WBC: 6 10*3/uL (ref 4.0–10.5)
nRBC: 0 % (ref 0.0–0.2)

## 2023-06-28 LAB — VITAMIN B12: Vitamin B-12: 820 pg/mL (ref 180–914)

## 2023-06-28 LAB — FERRITIN: Ferritin: 1267 ng/mL — ABNORMAL HIGH (ref 11–307)

## 2023-06-29 DIAGNOSIS — R808 Other proteinuria: Secondary | ICD-10-CM | POA: Diagnosis not present

## 2023-06-29 DIAGNOSIS — E876 Hypokalemia: Secondary | ICD-10-CM | POA: Diagnosis not present

## 2023-06-29 DIAGNOSIS — N182 Chronic kidney disease, stage 2 (mild): Secondary | ICD-10-CM | POA: Diagnosis not present

## 2023-07-01 LAB — METHYLMALONIC ACID, SERUM: Methylmalonic Acid, Quantitative: 147 nmol/L (ref 0–378)

## 2023-07-04 ENCOUNTER — Ambulatory Visit (INDEPENDENT_AMBULATORY_CARE_PROVIDER_SITE_OTHER): Payer: 59 | Admitting: Nurse Practitioner

## 2023-07-04 ENCOUNTER — Ambulatory Visit: Payer: 59 | Admitting: Family Medicine

## 2023-07-04 ENCOUNTER — Encounter: Payer: Self-pay | Admitting: Nurse Practitioner

## 2023-07-04 VITALS — BP 145/79 | HR 80 | Temp 97.8°F | Ht 64.0 in | Wt 102.0 lb

## 2023-07-04 DIAGNOSIS — R300 Dysuria: Secondary | ICD-10-CM | POA: Insufficient documentation

## 2023-07-04 DIAGNOSIS — N3000 Acute cystitis without hematuria: Secondary | ICD-10-CM | POA: Insufficient documentation

## 2023-07-04 LAB — URINALYSIS, ROUTINE W REFLEX MICROSCOPIC
Bilirubin, UA: NEGATIVE
Glucose, UA: NEGATIVE
Nitrite, UA: POSITIVE — AB
RBC, UA: NEGATIVE
Specific Gravity, UA: >1.03 — ABNORMAL HIGH (ref 1.005–1.030)
Urobilinogen, Ur: 0.2 mg/dL (ref 0.2–1.0)
pH, UA: 5.5 (ref 5.0–7.5)

## 2023-07-04 LAB — MICROSCOPIC EXAMINATION: WBC, UA: >30 /[HPF] — AB (ref 0–5)

## 2023-07-04 MED ORDER — DOXYCYCLINE HYCLATE 100 MG PO CAPS
100.0000 mg | ORAL_CAPSULE | Freq: Two times a day (BID) | ORAL | 0 refills | Status: DC
Start: 2023-07-04 — End: 2023-07-10

## 2023-07-04 NOTE — Progress Notes (Signed)
Established Patient Office Visit  Subjective   Patient ID: Erica Beasley, female    DOB: October 07, 1955  Age: 67 y.o. MRN: [id number]  Chief Complaint  Patient presents with   Urinary Frequency    Started last week, goes to bathroom every 30 minutes, having urinary incontinence, and pressure after using bathroom   Urinary Incontinence    HPI Erica Beasley is a 67 y.o. female who complains of urinary frequency, urgency and dysuria x 7 days, without flank pain, fever, chills, or abnormal vaginal discharge or bleeding.    Patient Active Problem List   Diagnosis Date Noted   Acute cystitis without hematuria 07/04/2023   Dysuria 07/04/2023   Hyperglycemia 10/24/2022   Synovitis of left knee 08/12/2022   Chronic bilateral low back pain without sciatica 06/29/2022   Chronic right SI joint pain 06/29/2022   Gait difficulty 06/29/2022   IDA (iron deficiency anemia) 06/07/2022   Symptomatic anemia 05/12/2022   Osteoporosis 01/17/2022   Hypothyroidism 11/13/2020   B12 deficiency 11/13/2020   COPD without exacerbation (HCC) 11/13/2020   Vitamin D deficiency 11/13/2020   Neuropathy 11/13/2020   Rheumatoid arthritis involving multiple sites (HCC) 11/13/2020   Mixed hyperlipidemia 11/13/2020   Aortic atherosclerosis (HCC) 11/13/2020   GERD (gastroesophageal reflux disease) 11/05/2020   Depression, recurrent (HCC) 09/22/2020   Monoclonal gammopathy 04/03/2020   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 03/26/2020   Normocytic anemia 05/03/2019   Chronic diarrhea 01/29/2019   Past Medical History:  Diagnosis Date   Aortic atherosclerosis (HCC) 11/13/2020   CKD (chronic kidney disease) stage 2, GFR 60-89 ml/min    COPD (chronic obstructive pulmonary disease) (HCC)    COVID-19 06/2019   Depression    Essential hypertension    GERD (gastroesophageal reflux disease)    H/O Clostridium difficile infection 05/03/2019   History of diabetes mellitus    Hx of colonic polyps 05/03/2019    Hyperlipidemia    Hypothyroidism    Neuropathy    Rheumatoid arthritis (HCC)    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 Allena Katz: mild diverticulsis in sigmoid colon.    COLONOSCOPY WITH PROPOFOL N/A 09/16/2019   Rourk: Diverticulosis, random colon biopsies negative for microscopic colitis.   COLONOSCOPY WITH PROPOFOL N/A 06/08/2020   Procedure: COLONOSCOPY WITH PROPOFOL;  Surgeon: Corbin Ade, MD;  [facility]: [facility];  Service: Endoscopy;  Laterality: N/A;  9:15am   ESOPHAGOGASTRODUODENOSCOPY (EGD) WITH PROPOFOL N/A 04/28/2020   Procedure: ESOPHAGOGASTRODUODENOSCOPY (EGD) WITH PROPOFOL;  Surgeon: Corbin Ade, MD;  [facility]: [facility];  Service: Endoscopy;  Laterality: N/A;  2:15   GIVENS CAPSULE STUDY N/A 08/12/2022   Procedure: GIVENS CAPSULE STUDY;  Surgeon: Corbin Ade, MD;  [facility]: [facility];  Service: Endoscopy;  Laterality: N/A;   YAG LASER APPLICATION Left 07/27/2015   Procedure: YAG LASER APPLICATION;  Surgeon: Susa Simmonds,  MD;  Location: [facility];  Service: Ophthalmology;  Laterality: Left;   Social History   Tobacco Use   Smoking status: Former    Current packs/day: 0.00    Average packs/day: 1 pack/day for 15.0 years (15.0 ttl pk-yrs)    Types: Cigarettes    Start date: 09/11/2002    Quit date: 09/11/2017    Years since quitting: 5.8   Smokeless tobacco: Former  Building services engineer status: Never Used   Substance Use Topics   Alcohol use: Never   Drug use: Never   Social History   Socioeconomic History   Marital status: Widowed    Spouse name: Not on file   Number of children: 1   Years of education: Not on file   Highest education level: 12th grade  Occupational History   Occupation: DISABLED  Tobacco Use   Smoking status: Former    Current packs/day: 0.00    Average packs/day: 1 pack/day for 15.0 years (15.0 ttl pk-yrs)    Types: Cigarettes    Start date: 09/11/2002    Quit date: 09/11/2017    Years since quitting: 5.8   Smokeless tobacco: Former  Building services engineer status: Never Used  Substance and Sexual Activity   Alcohol use: Never   Drug use: Never   Sexual activity: Not Currently  Other Topics Concern   Not on file  Social History Narrative   Disabled, widow, lives with daughter and her family. Patient still drives.    Social Determinants of Health   Financial Resource Strain: Low Risk  (12/28/2022)   Overall Financial Resource Strain (CARDIA)    Difficulty of Paying Living Expenses: Not hard at all  Food Insecurity: No Food Insecurity (12/28/2022)   Hunger Vital Sign    Worried About Running Out of Food in the Last Year: Never true    Ran Out of Food in the Last Year: Never true  Transportation Needs: No Transportation Needs (12/28/2022)   PRAPARE - Administrator, Civil Service (Medical): No    Lack of Transportation (Non-Medical): No  Physical Activity: Insufficiently Active (12/28/2022)   Exercise Vital Sign    Days of Exercise per Week: 1 day    Minutes of Exercise per Session: 30 min  Stress: Stress Concern Present (12/28/2022)   [employer] of Occupational Health - Occupational Stress Questionnaire    Feeling of Stress : To some extent  Social Connections: Socially Isolated (12/28/2022)   Social Connection and Isolation Panel [NHANES]    Frequency of Communication with Friends and Family: More than three times a week    Frequency of  Social Gatherings with Friends and Family: Once a week    Attends Religious Services: Never    Database administrator or Organizations: No    Attends Banker Meetings: Never    Marital Status: Widowed  Intimate Partner Violence: Not At Risk (12/13/2022)   Humiliation, Afraid, Rape, and Kick questionnaire    Fear of Current or Ex-Partner: No    Emotionally Abused: No    Physically Abused: No    Sexually Abused: No   Family Status  Relation Name Status   Mother  Deceased   Father  Deceased   MGM  Deceased   MGF  Deceased   PGM  Deceased   PGF  Deceased   Daughter  Alive   Neg Hx  (Not Specified)  No partnership data on file   Family History  Problem  Relation Age of Onset   Stroke Mother    Heart disease Mother    Diabetes Mother    Breast cancer Mother    Other Father        stomach taken out for some reason   Migraines Daughter    Colon cancer Neg Hx    Allergies  Allergen Reactions   Adalimumab     Other reaction(s): no response Pt is unaware of this Other reaction(s): no response   Methotrexate Other (See Comments)    Other reaction(s): GI side effects   Sulfasalazine     Other Reaction(s): Anemia   Upadacitinib Other (See Comments)    Other reaction(s): GI side effects      ROS Negative unless indicated in HPI   Objective:     BP (!) 145/79   Pulse 80   Temp 97.8 F (36.6 C) (Temporal)   Ht 5\' 4"  (1.626 m)   Wt 102 lb (46.3 kg)   SpO2 99%   BMI 17.51 kg/m  BP Readings from Last 3 Encounters:  07/04/23 (!) 145/79  06/14/23 135/89  06/01/23 (!) 160/95   Wt Readings from Last 3 Encounters:  07/04/23 102 lb (46.3 kg)  06/14/23 94 lb 12.8 oz (43 kg)  06/01/23 95 lb 2 oz (43.1 kg)      Physical Exam Appears well, in no apparent distress.  Vital signs are normal. The abdomen is soft without tenderness, guarding, mass, rebound or organomegaly. No CVA tenderness or inguinal adenopathy noted.  Urine dipstick shows positive for  protein, positive for nitrates, positive for leukocytes, positive for urobilinogen, and positive for ketones.  Micro exam: >30 WBC's per HPF, 0-2 RBC's per HPF, m +Mucus + many bacteria, granula casts seen, and epithelial cell 0-a0.   No results found for any visits on 07/04/23.  Last CBC Lab Results  Component Value Date   WBC 6.0 06/28/2023   HGB 8.7 (L) 06/28/2023   HCT 27.3 (L) 06/28/2023   MCV 94.8 06/28/2023   MCH 30.2 06/28/2023   RDW 13.7 06/28/2023   PLT 244 06/28/2023   Last metabolic panel Lab Results  Component Value Date   GLUCOSE 248 (H) 04/14/2023   NA 135 04/14/2023   K 3.6 04/14/2023   CL 103 04/14/2023   CO2 20 (L) 04/14/2023   BUN 19 04/14/2023   CREATININE 0.92 04/14/2023   GFRNONAA >60 04/14/2023   CALCIUM 9.5 04/14/2023   PHOS 3.4 07/08/2022   PROT 7.3 04/14/2023   ALBUMIN 3.4 (L) 04/14/2023   LABGLOB 2.1 02/28/2023   AGRATIO 1.7 02/28/2023   BILITOT 0.6 04/14/2023   ALKPHOS 95 04/14/2023   AST 18 04/14/2023   ALT 23 04/14/2023   ANIONGAP 12 04/14/2023   Last lipids Lab Results  Component Value Date   CHOL 136 02/28/2023   HDL 76 02/28/2023   LDLCALC 47 02/28/2023   TRIG 59 02/28/2023   CHOLHDL 1.8 02/28/2023   Last hemoglobin A1c Lab Results  Component Value Date   HGBA1C 5.9 (H) 02/28/2023   Last thyroid functions Lab Results  Component Value Date   TSH 0.083 (L) 02/28/2023   T4TOTAL 8.3 11/13/2020        Assessment & Plan:  Dysuria -     Urinalysis, Routine w reflex microscopic -     Doxycycline Hyclate; Take 1 capsule (100 mg total) by mouth 2 (two) times daily.  Dispense: 20 capsule; Refill: 0 -     Urine Culture  Acute cystitis without  hematuria -     Doxycycline Hyclate; Take 1 capsule (100 mg total) by mouth 2 (two) times daily.  Dispense: 20 capsule; Refill: 0 -     Urine Culture   Erica Beasley is a 67 yrs old female seen today for UTI, no acute distress Acute Cystitis: doxy 100 mg BID for 10 days also push fluids, may  use Pyridium OTC prn. Call or return to clinic prn if these symptoms worsen or fail to improve as anticipated.  Return if symptoms worsen or fail to improve, for follow-up.    Arrie Aran Santa Lighter, DNP [facility] [address] [place], [place] [address] [phone]

## 2023-07-04 NOTE — Progress Notes (Unsigned)
[facility] [address], [place] [address]   CLINIC:  Medical Oncology/Hematology  PCP:  Mechele Claude, MD [address] [place] [address] [phone]   REASON FOR VISIT:  Follow-up for normocytic anemia  INTERVAL HISTORY:   Erica Beasley 67 y.o. female returns for routine follow-up of her normocytic anemia.  She was last seen by Rojelio Brenner PA-C on 04/25/2023.  She has not required any IV iron or blood transfusion since her last visit.  At last visit, she was noted to have significantly improved blood count after her leflunomide was discontinued.  Since that time, she was started on rituximab (1000 mg IV every 2 weeks x 2 doses, then every 4 months) by her rheumatologist.  Her first dose was on 06/07/2023.  Prior to starting Rituxan her anemia had resolved with Hgb 12.1 (04/14/2023) and Hgb 11.3 (05/24/2023).  After starting Rituxan, she had a drop in hemoglobin to 10.5 on 06/21/2023, and Hgb 8.7 as of 06/28/2023.  She also continues to take hydroxychloroquine.  She reports COVID-19 infection in September 2024.  She lost her appetite during COVID, but has been eating better the past several days.  She reports significant fatigue. She continues to deny any bright red blood per rectum, melena, epistaxis, or other signs of blood loss. She has stable dyspnea on exertion.  She denies any pica, restless legs, headaches, lightheadedness, syncope, or chest pain.  No B symptoms such as fever, chills, night sweats, unintentional weight loss.  She has little to no energy and 100% appetite.  Her weight is today is 101 pounds, with BMI improved to 17.34.  ASSESSMENT & PLAN:  1.  NORMOCYTIC ANEMIA -  BMBX on 07/19/2021: Mildly hypercellular marrow with trilineage hematopoiesis.  No increased ring sideroblasts or blasts.  3% polytypic plasma cells.  No large aggregates of plasma cells.  Iron stores increased.  Chromosome analysis normal. - Etiology of patient's anemia remains  multifactorial.   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 Myelosuppression from rheumatology medications.  Leflunomide and sulfasalazine discontinued due to anemia, with significant improvement in blood counts.  Remains on hydroxychloroquine and started on Rituxan as of September 2024. Patient has been NEGATIVE for Hemoccult stool on multiple occasions within the past year.  No obvious rectal bleeding or melena.  EGD, colonoscopy, capsule studies did not reveal any source of major blood loss. Bone marrow biopsy was nondiagnostic.  Hemolysis labs and nutritional deficiency work-up have been unremarkable, apart from significant functional iron deficiency (no improvement in hemoglobin after IV iron) No evidence of chronic kidney disease based on patient's creatinine or Cystatin C - She is taking vitamin B12 tablets (500 mcg EOD) at home. - Trial of monthly Feraheme x2 doses did NOT improve her hemoglobin.  Iron tablets discontinued due to elevated iron levels. - She has required intermittent blood transfusions, most recently on 12/30/2022 (Hgb 7.0) - Leflunomide was discontinued by rheumatology on 01/18/2023.  Patient's blood count improved significantly after leflunomide was stopped. - Started on rituximab (1000 mg IV every 2 weeks x 2 doses, then every 4 months) by her rheumatologist, first dose on 06/07/2023. Prior to starting Rituxan her anemia had resolved with Hgb 12.1 (04/14/2023) and Hgb 11.3 (05/24/2023).  After starting Rituxan, she had a drop in hemoglobin to 10.5 on 06/21/2023, and Hgb 8.7 as of 06/28/2023.  Her next dose of Rituxan will be due in December 2024. - She has worsening  fatigue  - No bright red blood per rectum or melena - Most recent labs (06/28/2023): Hgb 8.7/MCV 94.8.  Normal WBC, platelets, differential. Ferritin 1267, iron saturation 46%, low TIBC 195. Normal B12 820 MMA. - PLAN: High suspicion for recurrent anemia  secondary to Rituxan. - Recommend additional labs today to rule out rituximab-induced-hemolytic anemia = fractionated bilirubin, LDH, haptoglobin, DAT/Coombs, CBC/BB sample, reticulocytes - CBC/BB sample weekly x4 - Continue vitamin B12 500 mcg every other day. - Patient educated on alarm symptoms that would prompt immediate medical attention/ED visit. - RTC 1 month with same-day labs   2.  Bence-Jones proteinuria -24-hour urine done by Dr. Wolfgang Phoenix showed urine immunofixation positive for Bence-Jones proteinuria, kappa type.  24-hour total protein was 77 mg/day. -Free kappa light chains were 52.9, lambda light chains 29.8 with ratio of 1.78.  Serum immunofixation was unremarkable.  SPEP did not show any evidence of M spike. -Labs on 04/03/2020 shows negative immunofixation.  Kappa light chain 61.3, lambda light chains 32.3, ratio 1.9. -Skeletal survey on 04/03/2020 was negative for lytic lesions. - Most recent MGUS/myeloma panel (10/04/2022) 24-hour urine/UPEP/UIFE/TP (08/29/2022): Total protein 261 mg/24-hr.  Urine immunofixation normal, M spike not observed.  Mildly elevated urine kappa light chains 140, with normal lambda normal ratio. Serum immunofixation unremarkable, no monoclonal protein SPEP negative for M spike. Mildly elevated serum kappa free light chain 36.4, normal lambda 21.3, mildly elevated ratio 1.71. - No new onset bone pain or neurologic changes  - PLAN: No evidence of plasma cell dyscrasia in blood or urine at this time.  We will recheck MGUS labs and urine studies annually (January 2025)  3.  Failure to thrive with low BMI - Intermittent episodes of poor appetite and weight loss associated with painful flareups of her RA - Weight has been fluctuating from 90 to 100 pounds over the past 6 months.  Her weight is today is 101 pounds, with BMI improved to 17.34. - She has been evaluated by nutritionist (phone note 01/30/2023) - EGD on 04/28/2020 with stomach and duodenal biopsy  negative. - CT CAP on 05/11/2020 showed long segment colonic thickening likely diverticular disease.  Stable small juxtapleural nodule in the right upper chest benign over 1 year.  Moderate large fat-containing left inguinal hernia.  Emphysema and diverticular calcification. - Colonoscopy on 06/08/2020 shows pancolonic diverticulosis, mild.  Normal-appearing colonic mucosa otherwise.  Random biopsies were negative.  4.  Rheumatoid arthritis - Follows with [facility] Azucena [place], [place]) - Leflunomide discontinued in June 2024 due to anemia - She is currently taking hydroxychloroquine.  Reports that she is "supposed to be starting some infusions"  5.  Osteoporosis - DEXA scan on 01/05/2022 with T score -2.7. - Recommended Prolia but her insurance did not cover it. - She started on Zometa on 02/23/2022. - Normal vitamin D (10/24/2022) at 38.1 - PLAN: Continue annual Zometa for total duration of 3 to 5 years.  Next Zometa due around 02/27/2024.  Continue calcium supplements. - Check calcium and Vitamin D with next appotinment  PLAN SUMMARY:  >> Labs today = CBC/BB sample, fractionated bilirubin, LDH, haptoglobin, DAT/Coombs, reticulocytes >> Weekly CBC/BB sample >> Same-day labs + OFFICE visit in 4 weeks (okay to put at 11 AM slot if necessary)    REVIEW OF SYSTEMS:  Review of Systems  Constitutional:  Positive for fatigue. Negative for appetite change, chills, diaphoresis, fever and unexpected weight change.  HENT:   Negative for lump/mass and nosebleeds.  Difficulty chewing  Eyes:  Negative for eye problems.  Respiratory:  Positive for shortness of breath (With exertion, mild/at baseline). Negative for cough and hemoptysis.   Cardiovascular:  Negative for chest pain, leg swelling and palpitations.  Gastrointestinal:  Negative for abdominal pain, blood in stool, constipation, diarrhea, nausea and vomiting.  Genitourinary:  Negative for hematuria.   Musculoskeletal:   Positive for arthralgias and back pain.  Skin: Negative.   Neurological:  Positive for extremity weakness (Residual deficit from CVA). Negative for dizziness, headaches and light-headedness.  Hematological:  Does not bruise/bleed easily.  Psychiatric/Behavioral:  Positive for sleep disturbance.      PHYSICAL EXAM:  ECOG PERFORMANCE STATUS: 1 - Symptomatic but completely ambulatory  Today's Vitals   07/05/23 1352 07/05/23 1354  BP:  138/86  Pulse:  86  Resp:  18  Temp:  98.3 F (36.8 C)  TempSrc:  Oral  SpO2:  100%  Weight:  101 lb (45.8 kg)  PainSc: 0-No pain   Body mass index is 17.34 kg/m.   Physical Exam Vitals reviewed.  Constitutional:      Appearance: Normal appearance.  Cardiovascular:     Rate and Rhythm: Normal rate and regular rhythm.     Pulses: Normal pulses.     Heart sounds: Normal heart sounds.  Pulmonary:     Effort: Pulmonary effort is normal.     Breath sounds: Normal breath sounds.  Neurological:     General: No focal deficit present.     Mental Status: She is alert and oriented to person, place, and time.     Cranial Nerves: Cranial nerve deficit (Slight left facial hemiparesis, left-sided tongue deviation) present.     Motor: Weakness (Mild residual left upper extremity weakness following CVA) present.  Psychiatric:        Mood and Affect: Mood normal.        Behavior: Behavior normal.    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 Allena Katz: mild diverticulsis in sigmoid colon.    COLONOSCOPY WITH PROPOFOL N/A 09/16/2019   Rourk: Diverticulosis, random colon biopsies negative for microscopic colitis.   COLONOSCOPY WITH PROPOFOL N/A 06/08/2020   Procedure: COLONOSCOPY WITH PROPOFOL;  Surgeon: Corbin Ade, MD;  [facility];  Service: Endoscopy;  Laterality: N/A;  9:15am  ESOPHAGOGASTRODUODENOSCOPY (EGD) WITH PROPOFOL N/A 04/28/2020   Procedure: ESOPHAGOGASTRODUODENOSCOPY (EGD) WITH PROPOFOL;  Surgeon: Corbin Ade, MD;  [facility];  Service: Endoscopy;  Laterality: N/A;  2:15   GIVENS CAPSULE STUDY N/A 08/12/2022   Procedure: GIVENS CAPSULE STUDY;  Surgeon: Corbin Ade, MD;  [facility]: [facility];  Service: Endoscopy;  Laterality: N/A;   YAG LASER APPLICATION Left 07/27/2015   Procedure: YAG LASER APPLICATION;  Surgeon: Susa Simmonds, MD;  Location: [facility];  Service: Ophthalmology;  Laterality: Left;    SOCIAL HISTORY:  Social History   Socioeconomic History   Marital status: Widowed    Spouse name: Not on file   Number of children: 1   Years of education: Not on file   Highest education level: 12th grade  Occupational History   Occupation: DISABLED  Tobacco Use   Smoking status: Former    Current packs/day: 0.00    Average packs/day: 1 pack/day for 15.0 years (15.0 ttl pk-yrs)    Types: Cigarettes    Start date: 09/11/2002     Quit date: 09/11/2017    Years since quitting: 5.8   Smokeless tobacco: Former  Building services engineer status: Never Used  Substance and Sexual Activity   Alcohol use: Never   Drug use: Never   Sexual activity: Not Currently  Other Topics Concern   Not on file  Social History Narrative   Disabled, widow, lives with daughter and her family. Patient still drives.    Social Determinants of Health   Financial Resource Strain: Low Risk  (12/28/2022)   Overall Financial Resource Strain (CARDIA)    Difficulty of Paying Living Expenses: Not hard at all  Food Insecurity: No Food Insecurity (12/28/2022)   Hunger Vital Sign    Worried About Running Out of Food in the Last Year: Never true    Ran Out of Food in the Last Year: Never true  Transportation Needs: No Transportation Needs (12/28/2022)   PRAPARE - Administrator, Civil Service (Medical): No    Lack of Transportation (Non-Medical): No  Physical Activity: Insufficiently Active (12/28/2022)   Exercise Vital Sign    Days of Exercise per Week: 1 day    Minutes of Exercise per Session: 30 min  Stress: Stress Concern Present (12/28/2022)   [employer] of Occupational Health - Occupational Stress Questionnaire    Feeling of Stress : To some extent  Social Connections: Socially Isolated (12/28/2022)   Social Connection and Isolation Panel [NHANES]    Frequency of Communication with Friends and Family: More than three times a week    Frequency of Social Gatherings with Friends and Family: Once a week    Attends Religious Services: Never    Database administrator or Organizations: No    Attends Banker Meetings: Never    Marital Status: Widowed  Intimate Partner Violence: Not At Risk (12/13/2022)   Humiliation, Afraid, Rape, and Kick questionnaire    Fear of Current or Ex-Partner: No    Emotionally Abused: No    Physically Abused: No    Sexually Abused: No    FAMILY HISTORY:  Family History  Problem Relation Age  of Onset   Stroke Mother    Heart disease Mother    Diabetes Mother    Breast cancer Mother    Other Father        stomach taken out for some reason   Migraines Daughter    Colon cancer  Neg Hx     CURRENT MEDICATIONS:  Outpatient Encounter Medications as of 07/05/2023  Medication Sig   albuterol (VENTOLIN HFA) 108 (90 Base) MCG/ACT inhaler Inhale 2 puffs into the lungs every 6 (six) hours as needed for wheezing or shortness of breath.   aspirin EC 81 MG tablet Take 1 tablet (81 mg total) by mouth daily. Swallow whole.   benzonatate (TESSALON) 100 MG capsule Take 1 capsule (100 mg total) by mouth 3 (three) times daily as needed.   Blood Glucose Monitoring Suppl DEVI 1 each by Does not apply route in the morning, at noon, and at bedtime. May substitute to any manufacturer covered by patient's insurance.   calcium carbonate (OSCAL) 1500 (600 Ca) MG TABS tablet Take 1,500 mg by mouth 2 (two) times daily with a meal.   Cyanocobalamin (B-12) 500 MCG TABS Take 500 mcg by mouth every other day.   desvenlafaxine (PRISTIQ) 50 MG 24 hr tablet Take 1 tablet (50 mg total) by mouth daily.   diclofenac Sodium (VOLTAREN) 1 % GEL Apply 4 g topically 4 (four) times daily.   doxycycline (VIBRAMYCIN) 100 MG capsule Take 1 capsule (100 mg total) by mouth 2 (two) times daily.   fluticasone (FLONASE) 50 MCG/ACT nasal spray Place 2 sprays into both nostrils daily.   folic acid (FOLVITE) 1 MG tablet TAKE ONE TABLET ONCE DAILY   gabapentin (NEURONTIN) 300 MG capsule Take 1 capsule (300 mg total) by mouth 3 (three) times daily.   Glucose Blood (BLOOD GLUCOSE TEST STRIPS) STRP 1 each by In Vitro route 2 (two) times daily before a meal. May substitute to any manufacturer covered by patient's insurance.   hydroxychloroquine (PLAQUENIL) 200 MG tablet Take 200 mg by mouth 2 (two) times daily.   levothyroxine (SYNTHROID) 125 MCG tablet Take 1 tablet (125 mcg total) by mouth every morning.   liothyronine (CYTOMEL) 5 MCG  tablet Take 2 tablets (10 mcg total) by mouth daily.   megestrol (MEGACE) 40 MG/ML suspension Take 10 mLs (400 mg total) by mouth 2 (two) times daily.   metFORMIN (GLUCOPHAGE-XR) 500 MG 24 hr tablet Take 1 tablet (500 mg total) by mouth 2 (two) times daily with a meal.   naproxen (NAPROSYN) 500 MG tablet Take 1 tablet (500 mg total) by mouth 2 (two) times daily with a meal.   potassium chloride SA (KLOR-CON M) 20 MEQ tablet Take 1 tablet (20 mEq total) by mouth daily.   predniSONE (DELTASONE) 5 MG tablet Take 5-10 mg by mouth daily.   rosuvastatin (CRESTOR) 40 MG tablet Take 1 tablet (40 mg total) by mouth daily. For cholesterol   tobramycin-dexamethasone (TOBRADEX) ophthalmic solution Apply 1 drop in affected eye(s) every 2 hours for two days. Then every 4 hours for 5 days.   traZODone (DESYREL) 100 MG tablet Take 1 tablet (100 mg total) by mouth at bedtime as needed. for sleep   No facility-administered encounter medications on file as of 07/05/2023.    ALLERGIES:  Allergies  Allergen Reactions   Adalimumab     Other reaction(s): no response Pt is unaware of this Other reaction(s): no response   Methotrexate Other (See Comments)    Other reaction(s): GI side effects   Sulfasalazine     Other Reaction(s): Anemia   Upadacitinib Other (See Comments)    Other reaction(s): GI side effects    LABORATORY DATA:  I have reviewed the labs as listed.  CBC    Component Value Date/Time   WBC 6.0 06/28/2023  1413   RBC 2.88 (L) 06/28/2023 1413   HGB 8.7 (L) 06/28/2023 1413   HGB 11.3 05/24/2023 1003   HCT 27.3 (L) 06/28/2023 1413   HCT 35.0 05/24/2023 1003   PLT 244 06/28/2023 1413   PLT 202 05/24/2023 1003   MCV 94.8 06/28/2023 1413   MCV 94 05/24/2023 1003   MCH 30.2 06/28/2023 1413   MCHC 31.9 06/28/2023 1413   RDW 13.7 06/28/2023 1413   RDW 13.6 05/24/2023 1003   LYMPHSABS 0.9 06/28/2023 1413   LYMPHSABS 2.1 05/24/2023 1003   MONOABS 0.3 06/28/2023 1413   EOSABS 0.1 06/28/2023  1413   EOSABS 0.1 05/24/2023 1003   BASOSABS 0.0 06/28/2023 1413   BASOSABS 0.0 05/24/2023 1003      Latest Ref Rng & Units 04/14/2023    9:29 AM 02/28/2023    4:00 PM 01/04/2023    4:32 PM  CMP  Glucose 70 - 99 mg/dL 161  096  045   BUN 8 - 23 mg/dL 19  14  8    Creatinine 0.44 - 1.00 mg/dL 4.09  8.11  9.14   Sodium 135 - 145 mmol/L 135  137  135   Potassium 3.5 - 5.1 mmol/L 3.6  4.1  3.5   Chloride 98 - 111 mmol/L 103  105  104   CO2 22 - 32 mmol/L 20  19  22    Calcium 8.9 - 10.3 mg/dL 9.5  8.1  8.6   Total Protein 6.5 - 8.1 g/dL 7.3  5.6  6.2   Total Bilirubin 0.3 - 1.2 mg/dL 0.6  <7.8  0.4   Alkaline Phos 38 - 126 U/L 95  162  85   AST 15 - 41 U/L 18  28  12    ALT 0 - 44 U/L 23  38  8     DIAGNOSTIC IMAGING:  I have independently reviewed the relevant imaging and discussed with the patient.   WRAP UP:  All questions were answered. The patient knows to call the clinic with any problems, questions or concerns.  Medical decision making: Moderate  Time spent on visit: I spent 20 minutes counseling the patient face to face. The total time spent in the appointment was 30 minutes and more than 50% was on counseling.  Carnella Guadalajara, PA-C  07/05/23 2:37 PM

## 2023-07-05 ENCOUNTER — Inpatient Hospital Stay (HOSPITAL_BASED_OUTPATIENT_CLINIC_OR_DEPARTMENT_OTHER): Payer: 59 | Admitting: Physician Assistant

## 2023-07-05 ENCOUNTER — Encounter: Payer: Self-pay | Admitting: Physician Assistant

## 2023-07-05 ENCOUNTER — Inpatient Hospital Stay: Payer: 59

## 2023-07-05 VITALS — BP 138/86 | HR 86 | Temp 98.3°F | Resp 18 | Wt 101.0 lb

## 2023-07-05 DIAGNOSIS — D649 Anemia, unspecified: Secondary | ICD-10-CM

## 2023-07-05 DIAGNOSIS — Z87891 Personal history of nicotine dependence: Secondary | ICD-10-CM | POA: Diagnosis not present

## 2023-07-05 DIAGNOSIS — Z803 Family history of malignant neoplasm of breast: Secondary | ICD-10-CM | POA: Diagnosis not present

## 2023-07-05 DIAGNOSIS — R627 Adult failure to thrive: Secondary | ICD-10-CM | POA: Diagnosis not present

## 2023-07-05 DIAGNOSIS — R803 Bence Jones proteinuria: Secondary | ICD-10-CM | POA: Diagnosis not present

## 2023-07-05 DIAGNOSIS — M069 Rheumatoid arthritis, unspecified: Secondary | ICD-10-CM | POA: Diagnosis not present

## 2023-07-05 DIAGNOSIS — Z681 Body mass index (BMI) 19 or less, adult: Secondary | ICD-10-CM | POA: Diagnosis not present

## 2023-07-05 DIAGNOSIS — M810 Age-related osteoporosis without current pathological fracture: Secondary | ICD-10-CM | POA: Diagnosis not present

## 2023-07-05 LAB — BILIRUBIN, FRACTIONATED(TOT/DIR/INDIR)
Bilirubin, Direct: 0.1 mg/dL (ref 0.0–0.2)
Indirect Bilirubin: 0.3 mg/dL (ref 0.3–0.9)
Total Bilirubin: 0.4 mg/dL (ref 0.3–1.2)

## 2023-07-05 LAB — CBC
HCT: 31.2 % — ABNORMAL LOW (ref 36.0–46.0)
Hemoglobin: 9.8 g/dL — ABNORMAL LOW (ref 12.0–15.0)
MCH: 30.4 pg (ref 26.0–34.0)
MCHC: 31.4 g/dL (ref 30.0–36.0)
MCV: 96.9 fL (ref 80.0–100.0)
Platelets: 278 10*3/uL (ref 150–400)
RBC: 3.22 MIL/uL — ABNORMAL LOW (ref 3.87–5.11)
RDW: 15 % (ref 11.5–15.5)
WBC: 8 10*3/uL (ref 4.0–10.5)
nRBC: 0.3 % — ABNORMAL HIGH (ref 0.0–0.2)

## 2023-07-05 LAB — LACTATE DEHYDROGENASE: LDH: 162 U/L (ref 98–192)

## 2023-07-05 LAB — SAMPLE TO BLOOD BANK

## 2023-07-05 LAB — DIRECT ANTIGLOBULIN TEST (NOT AT ARMC)
DAT, IgG: NEGATIVE
DAT, complement: NEGATIVE

## 2023-07-05 LAB — RETICULOCYTES
Immature Retic Fract: 34 % — ABNORMAL HIGH (ref 2.3–15.9)
RBC.: 3.26 MIL/uL — ABNORMAL LOW (ref 3.87–5.11)
Retic Count, Absolute: 121.9 10*3/uL (ref 19.0–186.0)
Retic Ct Pct: 3.7 % — ABNORMAL HIGH (ref 0.4–3.1)

## 2023-07-05 NOTE — Patient Instructions (Signed)
[facility] at [facility] **VISIT SUMMARY & IMPORTANT INSTRUCTIONS **   You were seen today by Rojelio Brenner PA-C for your anemia.    ANEMIA: Your blood counts had gotten better after your leflunomide was stopped.  Unfortunately, your anemia has gotten worse after you were started on Rituxan.   We will check some additional labs today to evaluate your anemia. We will check your blood count once a week for the next 4 weeks. I will see you for a follow-up visit in 1 month.  ** Please continue to work on eating plenty of calories, even when you are in pain.  It is important that you do not lose any more weight.  ** Thank you for trusting me with your healthcare!  I strive to provide all of my patients with quality care at each visit.  If you receive a survey for this visit, I would be so grateful to you for taking the time to provide feedback.  Thank you in advance!  ~ Burnette Sautter                   Dr. Doreatha Massed   &   Rojelio Brenner, PA-C   - - - - - - - - - - - - - - - - - -    Thank you for choosing [facility] at [facility] to provide your oncology and hematology care.  To afford each patient quality time with our provider, please arrive at least 15 minutes before your scheduled appointment time.   If you have a lab appointment with the Cancer Center please come in thru the Main Entrance and check in at the main information desk.  You need to re-schedule your appointment should you arrive 10 or more minutes late.  We strive to give you quality time with our providers, and arriving late affects you and other patients whose appointments are after yours.  Also, if you no show three or more times for appointments you may be dismissed from the clinic at the providers discretion.     Again, thank you for choosing [facility].  Our hope is that these requests will decrease the amount of time that you wait before being seen by  our physicians.       _____________________________________________________________  Should you have questions after your visit to [facility], please contact our office at [phone] and follow the prompts.  Our office hours are 8:00 a.m. and 4:30 p.m. Monday - Friday.  Please note that voicemails left after 4:00 p.m. may not be returned until the following business day.  We are closed weekends and major holidays.  You do have access to a nurse 24-7, just call the main number to the clinic [phone] and do not press any options, hold on the line and a nurse will answer the phone.    For prescription refill requests, have your pharmacy contact our office and allow 72 hours.

## 2023-07-06 LAB — HAPTOGLOBIN: Haptoglobin: 377 mg/dL — ABNORMAL HIGH (ref 37–355)

## 2023-07-08 LAB — URINE CULTURE

## 2023-07-10 ENCOUNTER — Other Ambulatory Visit: Payer: Self-pay | Admitting: Nurse Practitioner

## 2023-07-10 DIAGNOSIS — N3000 Acute cystitis without hematuria: Secondary | ICD-10-CM

## 2023-07-10 MED ORDER — AMOXICILLIN 875 MG PO TABS
875.0000 mg | ORAL_TABLET | Freq: Two times a day (BID) | ORAL | 0 refills | Status: DC
Start: 2023-07-10 — End: 2023-07-24

## 2023-07-11 ENCOUNTER — Other Ambulatory Visit: Payer: Self-pay | Admitting: Family Medicine

## 2023-07-12 ENCOUNTER — Inpatient Hospital Stay: Payer: 59

## 2023-07-12 DIAGNOSIS — R627 Adult failure to thrive: Secondary | ICD-10-CM | POA: Diagnosis not present

## 2023-07-12 DIAGNOSIS — R803 Bence Jones proteinuria: Secondary | ICD-10-CM | POA: Diagnosis not present

## 2023-07-12 DIAGNOSIS — Z681 Body mass index (BMI) 19 or less, adult: Secondary | ICD-10-CM | POA: Diagnosis not present

## 2023-07-12 DIAGNOSIS — M810 Age-related osteoporosis without current pathological fracture: Secondary | ICD-10-CM | POA: Diagnosis not present

## 2023-07-12 DIAGNOSIS — Z87891 Personal history of nicotine dependence: Secondary | ICD-10-CM | POA: Diagnosis not present

## 2023-07-12 DIAGNOSIS — Z803 Family history of malignant neoplasm of breast: Secondary | ICD-10-CM | POA: Diagnosis not present

## 2023-07-12 DIAGNOSIS — M069 Rheumatoid arthritis, unspecified: Secondary | ICD-10-CM | POA: Diagnosis not present

## 2023-07-12 DIAGNOSIS — D649 Anemia, unspecified: Secondary | ICD-10-CM

## 2023-07-12 LAB — CBC
HCT: 32.3 % — ABNORMAL LOW (ref 36.0–46.0)
Hemoglobin: 10 g/dL — ABNORMAL LOW (ref 12.0–15.0)
MCH: 29.9 pg (ref 26.0–34.0)
MCHC: 31 g/dL (ref 30.0–36.0)
MCV: 96.7 fL (ref 80.0–100.0)
Platelets: 225 10*3/uL (ref 150–400)
RBC: 3.34 MIL/uL — ABNORMAL LOW (ref 3.87–5.11)
RDW: 14.7 % (ref 11.5–15.5)
WBC: 9.2 10*3/uL (ref 4.0–10.5)
nRBC: 0 % (ref 0.0–0.2)

## 2023-07-12 LAB — SAMPLE TO BLOOD BANK

## 2023-07-13 ENCOUNTER — Other Ambulatory Visit (HOSPITAL_COMMUNITY): Payer: Self-pay | Admitting: Nephrology

## 2023-07-13 DIAGNOSIS — R808 Other proteinuria: Secondary | ICD-10-CM

## 2023-07-17 DIAGNOSIS — H26491 Other secondary cataract, right eye: Secondary | ICD-10-CM | POA: Diagnosis not present

## 2023-07-19 ENCOUNTER — Other Ambulatory Visit: Payer: Self-pay | Admitting: Physician Assistant

## 2023-07-19 ENCOUNTER — Encounter: Payer: Self-pay | Admitting: Physician Assistant

## 2023-07-19 ENCOUNTER — Inpatient Hospital Stay: Payer: 59

## 2023-07-19 DIAGNOSIS — D649 Anemia, unspecified: Secondary | ICD-10-CM

## 2023-07-19 DIAGNOSIS — R627 Adult failure to thrive: Secondary | ICD-10-CM | POA: Diagnosis not present

## 2023-07-19 DIAGNOSIS — Z681 Body mass index (BMI) 19 or less, adult: Secondary | ICD-10-CM | POA: Diagnosis not present

## 2023-07-19 DIAGNOSIS — Z803 Family history of malignant neoplasm of breast: Secondary | ICD-10-CM | POA: Diagnosis not present

## 2023-07-19 DIAGNOSIS — M810 Age-related osteoporosis without current pathological fracture: Secondary | ICD-10-CM | POA: Diagnosis not present

## 2023-07-19 DIAGNOSIS — M069 Rheumatoid arthritis, unspecified: Secondary | ICD-10-CM | POA: Diagnosis not present

## 2023-07-19 DIAGNOSIS — R803 Bence Jones proteinuria: Secondary | ICD-10-CM | POA: Diagnosis not present

## 2023-07-19 DIAGNOSIS — Z87891 Personal history of nicotine dependence: Secondary | ICD-10-CM | POA: Diagnosis not present

## 2023-07-19 LAB — CBC
HCT: 32.5 % — ABNORMAL LOW (ref 36.0–46.0)
Hemoglobin: 10.3 g/dL — ABNORMAL LOW (ref 12.0–15.0)
MCH: 30.3 pg (ref 26.0–34.0)
MCHC: 31.7 g/dL (ref 30.0–36.0)
MCV: 95.6 fL (ref 80.0–100.0)
Platelets: 219 10*3/uL (ref 150–400)
RBC: 3.4 MIL/uL — ABNORMAL LOW (ref 3.87–5.11)
RDW: 15.2 % (ref 11.5–15.5)
WBC: 7.4 10*3/uL (ref 4.0–10.5)
nRBC: 0 % (ref 0.0–0.2)

## 2023-07-19 LAB — SAMPLE TO BLOOD BANK

## 2023-07-19 NOTE — Progress Notes (Signed)
I have reviewed labs from 07/05/2023.  No evidence of hemolytic anemia. CBC trend over the past 2 weeks has shown improvement in hemoglobin. She does have some mild reticulocytosis. I suspect anemia is related to her recent rituximab treatment, but will recheck CBC, reticulocytes, and iron panel at her follow-up visit next month.  Carnella Guadalajara, PA-C 07/19/23 4:24 PM

## 2023-07-20 ENCOUNTER — Ambulatory Visit (HOSPITAL_COMMUNITY)
Admission: RE | Admit: 2023-07-20 | Discharge: 2023-07-20 | Disposition: A | Discharge status: Home / Self Care | Payer: 59 | Source: Ambulatory Visit | Attending: Nephrology | Admitting: Nephrology

## 2023-07-20 DIAGNOSIS — R808 Other proteinuria: Secondary | ICD-10-CM | POA: Diagnosis not present

## 2023-07-20 DIAGNOSIS — R809 Proteinuria, unspecified: Secondary | ICD-10-CM | POA: Diagnosis not present

## 2023-07-20 DIAGNOSIS — N1330 Unspecified hydronephrosis: Secondary | ICD-10-CM | POA: Diagnosis not present

## 2023-07-21 ENCOUNTER — Other Ambulatory Visit: Payer: Self-pay | Admitting: Family Medicine

## 2023-07-24 ENCOUNTER — Ambulatory Visit (INDEPENDENT_AMBULATORY_CARE_PROVIDER_SITE_OTHER): Payer: 59 | Admitting: Family Medicine

## 2023-07-24 ENCOUNTER — Encounter: Payer: Self-pay | Admitting: Family Medicine

## 2023-07-24 VITALS — BP 148/79 | HR 96 | Temp 98.2°F | Ht 64.0 in | Wt 100.0 lb

## 2023-07-24 DIAGNOSIS — E119 Type 2 diabetes mellitus without complications: Secondary | ICD-10-CM

## 2023-07-24 DIAGNOSIS — E039 Hypothyroidism, unspecified: Secondary | ICD-10-CM | POA: Diagnosis not present

## 2023-07-24 DIAGNOSIS — Z23 Encounter for immunization: Secondary | ICD-10-CM

## 2023-07-24 DIAGNOSIS — M0579 Rheumatoid arthritis with rheumatoid factor of multiple sites without organ or systems involvement: Secondary | ICD-10-CM | POA: Diagnosis not present

## 2023-07-24 LAB — BAYER DCA HB A1C WAIVED: HB A1C (BAYER DCA - WAIVED): 7.1 % — ABNORMAL HIGH (ref 4.8–5.6)

## 2023-07-24 MED ORDER — ROSUVASTATIN CALCIUM 40 MG PO TABS: 40.0000 mg | ORAL_TABLET | Freq: Every day | ORAL | 3 refills | Status: DC

## 2023-07-24 NOTE — Addendum Note (Signed)
Addended by: Angela Nevin D on: 07/24/2023 11:44 AM   Modules accepted: Orders

## 2023-07-24 NOTE — Progress Notes (Signed)
Subjective:  Patient ID: Erica Beasley, female    DOB: May 09, 1956  Age: 67 y.o. MRN: [id number]  CC: Medical Management of Chronic Issues   HPI Erica Beasley presents for .presents forFollow-up of diabetes. Patient checks blood sugar at home.   89-100 fasting and 123 postprandial Patient denies symptoms such as polyuria, polydipsia, excessive hunger, nausea No significant hypoglycemic spells noted. Medications reviewed. Pt reports taking them regularly without complication/adverse reaction being reported today.  Lab Results  Component Value Date   HGBA1C 5.9 (H) 02/28/2023   HGBA1C 9.0 (H) 10/24/2022   HGBA1C 4.6 (L) 07/08/2022         07/24/2023   10:16 AM 06/14/2023   11:10 AM 06/01/2023    9:37 AM  Depression screen PHQ 2/9  Decreased Interest 0 0 0  Down, Depressed, Hopeless 0 0 0  PHQ - 2 Score 0 0 0  Altered sleeping 0  0  Tired, decreased energy 3  3  Change in appetite 0  0  Feeling bad or failure about yourself  0  0  Trouble concentrating 0  0  Moving slowly or fidgety/restless 0  0  Suicidal thoughts 0  0  PHQ-9 Score 3  3  Difficult doing work/chores Not difficult at all  Not difficult at all    History Erica Beasley has a past medical history of Aortic atherosclerosis (HCC) (11/13/2020), CKD (chronic kidney disease) stage 2, GFR 60-89 ml/min, COPD (chronic obstructive pulmonary disease) (HCC), COVID-19 (06/2019), Depression, Essential hypertension, GERD (gastroesophageal reflux disease), H/O Clostridium difficile infection (05/03/2019), History of diabetes mellitus, colonic polyps (05/03/2019), Hyperlipidemia, Hypothyroidism, Neuropathy, Rheumatoid arthritis (HCC), 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   She has a past surgical history that includes Yag laser application (Left, 07/27/2015); Colonoscopy (09/2010); Colonoscopy with propofol (N/A, 09/16/2019); biopsy (09/16/2019);  Esophagogastroduodenoscopy (egd) with propofol (N/A, 04/28/2020); biopsy (04/28/2020); Cataract extraction (Bilateral); Colonoscopy with propofol (N/A, 06/08/2020); biopsy (06/08/2020); and Givens capsule study (N/A, 08/12/2022).   Her family history includes Breast cancer in her mother; Diabetes in her mother; Heart disease in her mother; Migraines in her daughter; Other in her father; Stroke in her mother.She reports that she quit smoking about 5 years ago. Her smoking use included cigarettes. She started smoking about 20 years ago. She has a 15 pack-year smoking history. She has quit using smokeless tobacco. She reports that she does not drink alcohol and does not use drugs.    ROS Review of Systems  Objective:  BP (!) 148/79   Pulse 96   Temp 98.2 F (36.8 C)   Ht 5\' 4"  (1.626 m)   Wt 100 lb (45.4 kg)   SpO2 99%   BMI 17.16 kg/m   BP Readings from Last 3 Encounters:  07/24/23 (!) 148/79  07/05/23 138/86  07/04/23 (!) 145/79    Wt Readings from Last 3 Encounters:  07/24/23 100 lb (45.4 kg)  07/05/23 101 lb (45.8 kg)  07/04/23 102 lb (46.3 kg)     Physical Exam Constitutional:      General: She is not in acute distress.    Appearance: She is well-developed.  Cardiovascular:     Rate and Rhythm: Normal rate and regular rhythm.     Heart sounds: Murmur (LSB) heard.  Pulmonary:     Breath sounds: Normal breath sounds.  Musculoskeletal:        General: Normal range of motion.  Skin:    General: Skin is warm  and dry.  Neurological:     Mental Status: She is alert and oriented to person, place, and time.       Assessment & Plan:   Spirit was seen today for medical management of chronic issues.  Diagnoses and all orders for this visit:  Diabetes mellitus without complication (HCC) -     Microalbumin / creatinine urine ratio -     Bayer DCA Hb A1c Waived -     CBC with Differential/Platelet -     CMP14+EGFR -     Lipid panel  Hypothyroidism, unspecified type -      TSH + free T4 -     Bayer DCA Hb A1c Waived -     CBC with Differential/Platelet -     CMP14+EGFR  Rheumatoid arthritis involving multiple sites with positive rheumatoid factor (HCC) -     CBC with Differential/Platelet -     CMP14+EGFR  Other orders -     rosuvastatin (CRESTOR) 40 MG tablet; Take 1 tablet (40 mg total) by mouth daily.       I have discontinued Erica Beasley's naproxen, benzonatate, and amoxicillin. I have also changed her rosuvastatin. Additionally, I am having her maintain her hydroxychloroquine, albuterol, calcium carbonate, diclofenac Sodium, aspirin EC, gabapentin, levothyroxine, liothyronine, traZODone, folic acid, Blood Glucose Monitoring Suppl, BLOOD GLUCOSE TEST STRIPS, desvenlafaxine, megestrol, metFORMIN, tobramycin-dexamethasone, B-12, potassium chloride SA, predniSONE, fluticasone, colchicine, and etodolac.  Allergies as of 07/24/2023       Reactions   Adalimumab    Other reaction(s): no response Pt is unaware of this Other reaction(s): no response   Methotrexate Other (See Comments)   Other reaction(s): GI side effects   Sulfasalazine    Other Reaction(s): Anemia   Upadacitinib Other (See Comments)   Other reaction(s): GI side effects        Medication List        Accurate as of July 24, 2023 11:01 AM. If you have any questions, ask your nurse or doctor.          STOP taking these medications    amoxicillin 875 MG tablet Commonly known as: AMOXIL Stopped by: Angeliki Mates   benzonatate 100 MG capsule Commonly known as: TESSALON Stopped by: Rafel Garde   naproxen 500 MG tablet Commonly known as: NAPROSYN Stopped by: Kylon Philbrook       TAKE these medications    albuterol 108 (90 Base) MCG/ACT inhaler Commonly known as: VENTOLIN HFA Inhale 2 puffs into the lungs every 6 (six) hours as needed for wheezing or shortness of breath.   aspirin EC 81 MG tablet Take 1 tablet (81 mg total) by mouth daily. Swallow  whole.   B-12 500 MCG Tabs Take 500 mcg by mouth every other day.   Blood Glucose Monitoring Suppl Devi 1 each by Does not apply route in the morning, at noon, and at bedtime. May substitute to any manufacturer covered by patient's insurance.   BLOOD GLUCOSE TEST STRIPS Strp 1 each by In Vitro route 2 (two) times daily before a meal. May substitute to any manufacturer covered by patient's insurance.   calcium carbonate 1500 (600 Ca) MG Tabs tablet Commonly known as: OSCAL Take 1,500 mg by mouth 2 (two) times daily with a meal.   colchicine 0.6 MG tablet Take 0.6 mg by mouth daily.   desvenlafaxine 50 MG 24 hr tablet Commonly known as: Pristiq Take 1 tablet (50 mg total) by mouth daily.   diclofenac Sodium 1 %  Gel Commonly known as: Voltaren Apply 4 g topically 4 (four) times daily.   etodolac 300 MG capsule Commonly known as: LODINE Take 300 mg by mouth 2 (two) times daily.   fluticasone 50 MCG/ACT nasal spray Commonly known as: FLONASE Place 2 sprays into both nostrils daily.   folic acid 1 MG tablet Commonly known as: FOLVITE TAKE ONE TABLET ONCE DAILY   gabapentin 300 MG capsule Commonly known as: NEURONTIN Take 1 capsule (300 mg total) by mouth 3 (three) times daily.   hydroxychloroquine 200 MG tablet Commonly known as: PLAQUENIL Take 200 mg by mouth 2 (two) times daily.   levothyroxine 125 MCG tablet Commonly known as: SYNTHROID Take 1 tablet (125 mcg total) by mouth every morning.   liothyronine 5 MCG tablet Commonly known as: CYTOMEL Take 2 tablets (10 mcg total) by mouth daily.   megestrol 40 MG/ML suspension Commonly known as: MEGACE Take 10 mLs (400 mg total) by mouth 2 (two) times daily.   metFORMIN 500 MG 24 hr tablet Commonly known as: GLUCOPHAGE-XR Take 1 tablet (500 mg total) by mouth 2 (two) times daily with a meal.   potassium chloride SA 20 MEQ tablet Commonly known as: KLOR-CON M Take 1 tablet (20 mEq total) by mouth daily.    predniSONE 5 MG tablet Commonly known as: DELTASONE Take 5-10 mg by mouth daily.   rosuvastatin 40 MG tablet Commonly known as: CRESTOR Take 1 tablet (40 mg total) by mouth daily. What changed: See the new instructions. Changed by: Broadus John Dontee Jaso   tobramycin-dexamethasone ophthalmic solution Commonly known as: TobraDex Apply 1 drop in affected eye(s) every 2 hours for two days. Then every 4 hours for 5 days.   traZODone 100 MG tablet Commonly known as: DESYREL Take 1 tablet (100 mg total) by mouth at bedtime as needed. for sleep         Follow-up: Return in about 3 months (around 10/24/2023) for diabetes, Hypothyroidism.  Mechele Claude, M.D.

## 2023-07-25 LAB — CBC WITH DIFFERENTIAL/PLATELET
Basophils Absolute: 0 10*3/uL (ref 0.0–0.2)
Basos: 0 %
EOS (ABSOLUTE): 0 10*3/uL (ref 0.0–0.4)
Eos: 0 %
Hematocrit: 31 % — ABNORMAL LOW (ref 34.0–46.6)
Hemoglobin: 9.8 g/dL — ABNORMAL LOW (ref 11.1–15.9)
Immature Grans (Abs): 0 10*3/uL (ref 0.0–0.1)
Immature Granulocytes: 1 %
Lymphocytes Absolute: 1.6 10*3/uL (ref 0.7–3.1)
Lymphs: 21 %
MCH: 29.9 pg (ref 26.6–33.0)
MCHC: 31.6 g/dL (ref 31.5–35.7)
MCV: 95 fL (ref 79–97)
Monocytes Absolute: 0.6 10*3/uL (ref 0.1–0.9)
Monocytes: 8 %
Neutrophils Absolute: 5.3 10*3/uL (ref 1.4–7.0)
Neutrophils: 70 %
Platelets: 240 10*3/uL (ref 150–450)
RBC: 3.28 x10E6/uL — ABNORMAL LOW (ref 3.77–5.28)
RDW: 14.1 % (ref 11.7–15.4)
WBC: 7.5 10*3/uL (ref 3.4–10.8)

## 2023-07-25 LAB — CMP14+EGFR
ALT: 15 [IU]/L (ref 0–32)
AST: 16 [IU]/L (ref 0–40)
Albumin: 3.6 g/dL — ABNORMAL LOW (ref 3.9–4.9)
Alkaline Phosphatase: 80 [IU]/L (ref 44–121)
BUN/Creatinine Ratio: 16 (ref 12–28)
BUN: 13 mg/dL (ref 8–27)
Bilirubin Total: 0.3 mg/dL (ref 0.0–1.2)
CO2: 21 mmol/L (ref 20–29)
Calcium: 9.1 mg/dL (ref 8.7–10.3)
Chloride: 106 mmol/L (ref 96–106)
Creatinine, Ser: 0.83 mg/dL (ref 0.57–1.00)
Globulin, Total: 2.5 g/dL (ref 1.5–4.5)
Glucose: 83 mg/dL (ref 70–99)
Potassium: 3.7 mmol/L (ref 3.5–5.2)
Sodium: 142 mmol/L (ref 134–144)
Total Protein: 6.1 g/dL (ref 6.0–8.5)
eGFR: 78 mL/min/{1.73_m2} (ref >59)

## 2023-07-25 LAB — MICROALBUMIN / CREATININE URINE RATIO
Creatinine, Urine: 209.4 mg/dL
Microalb/Creat Ratio: 26 mg/g{creat} (ref 0–29)
Microalbumin, Urine: 54.7 ug/mL

## 2023-07-25 LAB — LIPID PANEL
Chol/HDL Ratio: 1.8 ratio (ref 0.0–4.4)
Cholesterol, Total: 112 mg/dL (ref 100–199)
HDL: 61 mg/dL (ref >39)
LDL Chol Calc (NIH): 33 mg/dL (ref 0–99)
Triglycerides: 98 mg/dL (ref 0–149)
VLDL Cholesterol Cal: 18 mg/dL (ref 5–40)

## 2023-07-25 LAB — TSH+FREE T4
Free T4: 2.01 ng/dL — ABNORMAL HIGH (ref 0.82–1.77)
TSH: 0.04 u[IU]/mL — ABNORMAL LOW (ref 0.450–4.500)

## 2023-07-26 ENCOUNTER — Inpatient Hospital Stay: Payer: 59

## 2023-07-26 ENCOUNTER — Other Ambulatory Visit (HOSPITAL_COMMUNITY): Payer: Self-pay | Admitting: Family Medicine

## 2023-07-26 DIAGNOSIS — R627 Adult failure to thrive: Secondary | ICD-10-CM | POA: Diagnosis not present

## 2023-07-26 DIAGNOSIS — D649 Anemia, unspecified: Secondary | ICD-10-CM

## 2023-07-26 DIAGNOSIS — M810 Age-related osteoporosis without current pathological fracture: Secondary | ICD-10-CM | POA: Diagnosis not present

## 2023-07-26 DIAGNOSIS — Z803 Family history of malignant neoplasm of breast: Secondary | ICD-10-CM | POA: Diagnosis not present

## 2023-07-26 DIAGNOSIS — Z1231 Encounter for screening mammogram for malignant neoplasm of breast: Secondary | ICD-10-CM

## 2023-07-26 DIAGNOSIS — M069 Rheumatoid arthritis, unspecified: Secondary | ICD-10-CM | POA: Diagnosis not present

## 2023-07-26 DIAGNOSIS — Z87891 Personal history of nicotine dependence: Secondary | ICD-10-CM | POA: Diagnosis not present

## 2023-07-26 DIAGNOSIS — R803 Bence Jones proteinuria: Secondary | ICD-10-CM | POA: Diagnosis not present

## 2023-07-26 DIAGNOSIS — Z681 Body mass index (BMI) 19 or less, adult: Secondary | ICD-10-CM | POA: Diagnosis not present

## 2023-07-26 LAB — CBC
HCT: 30.5 % — ABNORMAL LOW (ref 36.0–46.0)
Hemoglobin: 9.7 g/dL — ABNORMAL LOW (ref 12.0–15.0)
MCH: 30.4 pg (ref 26.0–34.0)
MCHC: 31.8 g/dL (ref 30.0–36.0)
MCV: 95.6 fL (ref 80.0–100.0)
Platelets: 238 10*3/uL (ref 150–400)
RBC: 3.19 MIL/uL — ABNORMAL LOW (ref 3.87–5.11)
RDW: 14.6 % (ref 11.5–15.5)
WBC: 6.2 10*3/uL (ref 4.0–10.5)
nRBC: 0 % (ref 0.0–0.2)

## 2023-07-26 LAB — SAMPLE TO BLOOD BANK

## 2023-08-01 ENCOUNTER — Inpatient Hospital Stay: Payer: 59

## 2023-08-01 NOTE — Progress Notes (Unsigned)
[facility] [address], [place] [address]   CLINIC:  Medical Oncology/Hematology  PCP:  Mechele Claude, MD [address] [place] [address] [phone]   REASON FOR VISIT:  Follow-up for normocytic anemia  INTERVAL HISTORY:   Erica Beasley 67 y.o. female returns for routine follow-up of her normocytic anemia.  She was last seen by Rojelio Brenner PA-C on 07/05/2023.  She has not required any IV iron or blood transfusion since her last visit.***  At visit on 07/05/2023, it was noted that patient had some recurrent anemia after being started on Rituxan for her rheumatoid arthritis.  She is also continued taking hydroxychloroquine.  Blood counts have been improving over the past month. Patient reports feeling ***. *** She reports significant fatigue. *** She continues to deny any bright red blood per rectum, melena, epistaxis, or other signs of blood loss. *** She has stable dyspnea on exertion. *** She denies any pica, restless legs, headaches, lightheadedness, syncope, or chest pain. *** No B symptoms such as fever, chills, night sweats, unintentional weight loss.  She has little to no*** energy and 100***% appetite.  Her weight is today is 101*** pounds, with BMI improved to 17.34***.  ASSESSMENT & PLAN:  1.  NORMOCYTIC ANEMIA -  BMBX on 07/19/2021: Mildly hypercellular marrow with trilineage hematopoiesis.  No increased ring sideroblasts or blasts.  3% polytypic plasma cells.  No large aggregates of plasma cells.  Iron stores increased.  Chromosome analysis normal. - Etiology of patient's anemia remains multifactorial.   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 Myelosuppression from rheumatology medications.  Leflunomide and sulfasalazine discontinued due to anemia, with significant improvement in blood counts.  Remains on hydroxychloroquine and started on Rituxan as of September 2024. Patient  has been NEGATIVE for Hemoccult stool on multiple occasions within the past year.  No obvious rectal bleeding or melena.  EGD, colonoscopy, capsule studies did not reveal any source of major blood loss. Bone marrow biopsy was nondiagnostic.  Hemolysis labs and nutritional deficiency work-up have been unremarkable, apart from significant functional iron deficiency (no improvement in hemoglobin after IV iron) No evidence of chronic kidney disease based on patient's creatinine or Cystatin C - She is taking vitamin B12 tablets (500 mcg EOD) at home. - Trial of monthly Feraheme x2 doses did NOT improve her hemoglobin.  Iron tablets discontinued due to elevated iron levels. - She has required intermittent blood transfusions, most recently on 12/30/2022 (Hgb 7.0) - Leflunomide was discontinued by rheumatology on 01/18/2023.  Patient's blood count improved significantly after leflunomide was stopped. - Started on rituximab (1000 mg IV every 2 weeks x 2 doses, then every 4 months) by her rheumatologist, first dose on 06/07/2023. Prior to starting Rituxan her anemia had resolved with Hgb 12.1 (04/14/2023) and Hgb 11.3 (05/24/2023).  After starting Rituxan, she had a drop in hemoglobin to 10.5 on 06/21/2023, and Hgb 8.7 as of 06/28/2023.  Her next dose of Rituxan will be due in December 2024. - Workup of recurrent anemia (September/October 2024) No nutritional deficiencies (ferritin 1267, iron saturation 46%, low TIBC 195.  Normal B12/MMA.) No evidence of Rituxan induced hemolysis (DAT negative, normal haptoglobin, normal LDH, normal bilirubin) Reticulocytes elevated to 3.7%, which could indicate compensation for recent blood loss or signs of bone marrow recovery following suppression from Rituxan*** - Labs today (08/02/2023): *** - She has worsening fatigue  - No bright red blood per rectum or  melena - Most recent labs (06/28/2023): Hgb 8.7/MCV 94.8.  Normal WBC, platelets, differential. Ferritin 1267, iron saturation  46%, low TIBC 195. Normal B12 820, normal MMA. - PLAN: *** Plan TBD *** ***High suspicion for recurrent anemia secondary to Rituxan. - Recommend additional labs today to rule out rituximab-induced-hemolytic anemia = fractionated bilirubin, LDH, haptoglobin, DAT/Coombs, CBC/BB sample, reticulocytes - CBC/BB sample weekly x4 - Continue vitamin B12 500 mcg every other day. - Patient educated on alarm symptoms that would prompt immediate medical attention/ED visit. - RTC 1 month with same-day labs   2.  Bence-Jones proteinuria -24-hour urine done by Dr. Wolfgang Phoenix showed urine immunofixation positive for Bence-Jones proteinuria, kappa type.  24-hour total protein was 77 mg/day. -Free kappa light chains were 52.9, lambda light chains 29.8 with ratio of 1.78.  Serum immunofixation was unremarkable.  SPEP did not show any evidence of M spike. -Labs on 04/03/2020 shows negative immunofixation.  Kappa light chain 61.3, lambda light chains 32.3, ratio 1.9. -Skeletal survey on 04/03/2020 was negative for lytic lesions. - Most recent MGUS/myeloma panel (10/04/2022) 24-hour urine/UPEP/UIFE/TP (08/29/2022): Total protein 261 mg/24-hr.  Urine immunofixation normal, M spike not observed.  Mildly elevated urine kappa light chains 140, with normal lambda normal ratio. Serum immunofixation unremarkable, no monoclonal protein SPEP negative for M spike. Mildly elevated serum kappa free light chain 36.4, normal lambda 21.3, mildly elevated ratio 1.71. - No new onset bone pain or neurologic changes  - PLAN: No evidence of plasma cell dyscrasia in blood or urine at this time.  We will recheck MGUS labs and urine studies annually (January 2025)  3.  Failure to thrive with low BMI*** - Intermittent episodes of poor appetite and weight loss associated with painful flareups of her RA - Weight has been fluctuating from 90 to 100 pounds over the past 6 months.  Her weight is today is 101 pounds, with BMI improved to 17.34. - She  has been evaluated by nutritionist (phone note 01/30/2023) - EGD on 04/28/2020 with stomach and duodenal biopsy negative. - CT CAP on 05/11/2020 showed long segment colonic thickening likely diverticular disease.  Stable small juxtapleural nodule in the right upper chest benign over 1 year.  Moderate large fat-containing left inguinal hernia.  Emphysema and diverticular calcification. - Colonoscopy on 06/08/2020 shows pancolonic diverticulosis, mild.  Normal-appearing colonic mucosa otherwise.  Random biopsies were negative.  4.  Rheumatoid arthritis - Follows with [facility] Azucena [place], [place]) - Leflunomide discontinued in June 2024 due to anemia - Rituxan infusions started on 06/07/2023 - She continues to take hydroxychloroquine  5.  Osteoporosis - DEXA scan on 01/05/2022 with T score -2.7. - Recommended Prolia but her insurance did not cover it. - She started on Zometa on 02/23/2022. - Normal vitamin D (10/24/2022) at 38.1 - PLAN: Continue annual Zometa for total duration of 3 to 5 years.  Next Zometa due around 02/27/2024.  Continue calcium supplements. - Check calcium and Vitamin D with next appotinment  PLAN SUMMARY: *** TBD *** >> Labs today = CBC/BB sample, fractionated bilirubin, LDH, haptoglobin, DAT/Coombs, reticulocytes >> Weekly CBC/BB sample >> Same-day labs + OFFICE visit in 4 weeks (okay to put at 11 AM slot if necessary)    REVIEW OF SYSTEMS:***  Review of Systems  Constitutional:  Positive for fatigue. Negative for appetite change, chills, diaphoresis, fever and unexpected weight change.  HENT:   Negative for lump/mass and nosebleeds.        Difficulty chewing  Eyes:  Negative for eye  problems.  Respiratory:  Positive for shortness of breath (With exertion, mild/at baseline). Negative for cough and hemoptysis.   Cardiovascular:  Negative for chest pain, leg swelling and palpitations.  Gastrointestinal:  Negative for abdominal pain, blood in stool, constipation,  diarrhea, nausea and vomiting.  Genitourinary:  Negative for hematuria.   Musculoskeletal:  Positive for arthralgias and back pain.  Skin: Negative.   Neurological:  Positive for extremity weakness (Residual deficit from CVA). Negative for dizziness, headaches and light-headedness.  Hematological:  Does not bruise/bleed easily.  Psychiatric/Behavioral:  Positive for sleep disturbance.      PHYSICAL EXAM:***  ECOG PERFORMANCE STATUS: 1 - Symptomatic but completely ambulatory  There were no vitals filed for this visit. There is no height or weight on file to calculate BMI.   Physical Exam Vitals reviewed.  Constitutional:      Appearance: Normal appearance.  Cardiovascular:     Rate and Rhythm: Normal rate and regular rhythm.     Pulses: Normal pulses.     Heart sounds: Normal heart sounds.  Pulmonary:     Effort: Pulmonary effort is normal.     Breath sounds: Normal breath sounds.  Neurological:     General: No focal deficit present.     Mental Status: She is alert and oriented to person, place, and time.     Cranial Nerves: Cranial nerve deficit (Slight left facial hemiparesis, left-sided tongue deviation) present.     Motor: Weakness (Mild residual left upper extremity weakness following CVA) present.  Psychiatric:        Mood and Affect: Mood normal.        Behavior: Behavior normal.    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 Allena Katz: mild diverticulsis in sigmoid colon.    COLONOSCOPY WITH PROPOFOL N/A 09/16/2019   Rourk: Diverticulosis, random colon biopsies negative for microscopic colitis.   COLONOSCOPY WITH PROPOFOL N/A 06/08/2020   Procedure: COLONOSCOPY WITH PROPOFOL;  Surgeon: Corbin Ade, MD;  [facility]: [facility];  Service: Endoscopy;  Laterality: N/A;  9:15am   ESOPHAGOGASTRODUODENOSCOPY (EGD) WITH PROPOFOL N/A 04/28/2020   Procedure: ESOPHAGOGASTRODUODENOSCOPY (EGD) WITH PROPOFOL;  Surgeon: Corbin Ade, MD;  [facility]: [facility];  Service: Endoscopy;  Laterality: N/A;  2:15   GIVENS CAPSULE STUDY N/A 08/12/2022   Procedure: GIVENS  CAPSULE STUDY;  Surgeon: Corbin Ade, MD;  [facility]: [facility];  Service: Endoscopy;  Laterality: N/A;   YAG LASER APPLICATION Left 07/27/2015   Procedure: YAG LASER APPLICATION;  Surgeon: Susa Simmonds, MD;  Location: [facility];  Service: Ophthalmology;  Laterality: Left;    SOCIAL HISTORY:  Social History   Socioeconomic History   Marital status: Widowed    Spouse name: Not on file   Number of children: 1   Years of education: Not on file   Highest education level: 12th grade  Occupational History   Occupation: DISABLED  Tobacco Use   Smoking status: Former    Current packs/day: 0.00    Average packs/day: 1 pack/day for 15.0 years (15.0 ttl pk-yrs)    Types: Cigarettes    Start date: 09/11/2002    Quit date: 09/11/2017     Years since quitting: 5.8   Smokeless tobacco: Former  Building services engineer status: Never Used  Substance and Sexual Activity   Alcohol use: Never   Drug use: Never   Sexual activity: Not Currently  Other Topics Concern   Not on file  Social History Narrative   Disabled, widow, lives with daughter and her family. Patient still drives.    Social Determinants of Health   Financial Resource Strain: Low Risk  (12/28/2022)   Overall Financial Resource Strain (CARDIA)    Difficulty of Paying Living Expenses: Not hard at all  Food Insecurity: No Food Insecurity (12/28/2022)   Hunger Vital Sign    Worried About Running Out of Food in the Last Year: Never true    Ran Out of Food in the Last Year: Never true  Transportation Needs: No Transportation Needs (12/28/2022)   PRAPARE - Administrator, Civil Service (Medical): No    Lack of Transportation (Non-Medical): No  Physical Activity: Insufficiently Active (12/28/2022)   Exercise Vital Sign    Days of Exercise per Week: 1 day    Minutes of Exercise per Session: 30 min  Stress: Stress Concern Present (12/28/2022)   [employer] of Occupational Health - Occupational Stress Questionnaire    Feeling of Stress : To some extent  Social Connections: Socially Isolated (12/28/2022)   Social Connection and Isolation Panel [NHANES]    Frequency of Communication with Friends and Family: More than three times a week    Frequency of Social Gatherings with Friends and Family: Once a week    Attends Religious Services: Never    Database administrator or Organizations: No    Attends Banker Meetings: Never    Marital Status: Widowed  Intimate Partner Violence: Not At Risk (12/13/2022)   Humiliation, Afraid, Rape, and Kick questionnaire    Fear of Current or Ex-Partner: No    Emotionally Abused: No    Physically Abused: No    Sexually Abused: No    FAMILY HISTORY:  Family History  Problem Relation Age of Onset   Stroke Mother     Heart disease Mother    Diabetes Mother    Breast cancer Mother    Other Father        stomach taken out for some reason   Migraines Daughter    Colon cancer Neg Hx     CURRENT MEDICATIONS:  Outpatient Encounter Medications as of 08/02/2023  Medication Sig   albuterol (VENTOLIN HFA) 108 (90 Base) MCG/ACT inhaler Inhale 2 puffs into the lungs every 6 (six) hours as needed for wheezing or  shortness of breath.   aspirin EC 81 MG tablet Take 1 tablet (81 mg total) by mouth daily. Swallow whole.   Blood Glucose Monitoring Suppl DEVI 1 each by Does not apply route in the morning, at noon, and at bedtime. May substitute to any manufacturer covered by patient's insurance.   calcium carbonate (OSCAL) 1500 (600 Ca) MG TABS tablet Take 1,500 mg by mouth 2 (two) times daily with a meal.   colchicine 0.6 MG tablet Take 0.6 mg by mouth daily.   Cyanocobalamin (B-12) 500 MCG TABS Take 500 mcg by mouth every other day.   desvenlafaxine (PRISTIQ) 50 MG 24 hr tablet Take 1 tablet (50 mg total) by mouth daily.   diclofenac Sodium (VOLTAREN) 1 % GEL Apply 4 g topically 4 (four) times daily.   etodolac (LODINE) 300 MG capsule Take 300 mg by mouth 2 (two) times daily.   fluticasone (FLONASE) 50 MCG/ACT nasal spray Place 2 sprays into both nostrils daily.   folic acid (FOLVITE) 1 MG tablet TAKE ONE TABLET ONCE DAILY   gabapentin (NEURONTIN) 300 MG capsule Take 1 capsule (300 mg total) by mouth 3 (three) times daily.   Glucose Blood (BLOOD GLUCOSE TEST STRIPS) STRP 1 each by In Vitro route 2 (two) times daily before a meal. May substitute to any manufacturer covered by patient's insurance.   hydroxychloroquine (PLAQUENIL) 200 MG tablet Take 200 mg by mouth 2 (two) times daily.   levothyroxine (SYNTHROID) 125 MCG tablet Take 1 tablet (125 mcg total) by mouth every morning.   liothyronine (CYTOMEL) 5 MCG tablet Take 2 tablets (10 mcg total) by mouth daily.   megestrol (MEGACE) 40 MG/ML suspension Take 10 mLs  (400 mg total) by mouth 2 (two) times daily.   metFORMIN (GLUCOPHAGE-XR) 500 MG 24 hr tablet Take 1 tablet (500 mg total) by mouth 2 (two) times daily with a meal.   potassium chloride SA (KLOR-CON M) 20 MEQ tablet Take 1 tablet (20 mEq total) by mouth daily.   predniSONE (DELTASONE) 5 MG tablet Take 5-10 mg by mouth daily.   rosuvastatin (CRESTOR) 40 MG tablet Take 1 tablet (40 mg total) by mouth daily.   tobramycin-dexamethasone (TOBRADEX) ophthalmic solution Apply 1 drop in affected eye(s) every 2 hours for two days. Then every 4 hours for 5 days.   traZODone (DESYREL) 100 MG tablet Take 1 tablet (100 mg total) by mouth at bedtime as needed. for sleep   No facility-administered encounter medications on file as of 08/02/2023.    ALLERGIES:  Allergies  Allergen Reactions   Adalimumab     Other reaction(s): no response Pt is unaware of this Other reaction(s): no response   Methotrexate Other (See Comments)    Other reaction(s): GI side effects   Sulfasalazine     Other Reaction(s): Anemia   Upadacitinib Other (See Comments)    Other reaction(s): GI side effects    LABORATORY DATA:  I have reviewed the labs as listed.  CBC    Component Value Date/Time   WBC 6.2 07/26/2023 1333   RBC 3.19 (L) 07/26/2023 1333   HGB 9.7 (L) 07/26/2023 1333   HGB 9.8 (L) 07/24/2023 1128   HCT 30.5 (L) 07/26/2023 1333   HCT 31.0 (L) 07/24/2023 1128   PLT 238 07/26/2023 1333   PLT 240 07/24/2023 1128   MCV 95.6 07/26/2023 1333   MCV 95 07/24/2023 1128   MCH 30.4 07/26/2023 1333   MCHC 31.8 07/26/2023 1333   RDW 14.6 07/26/2023 1333  RDW 14.1 07/24/2023 1128   LYMPHSABS 1.6 07/24/2023 1128   MONOABS 0.3 06/28/2023 1413   EOSABS 0.0 07/24/2023 1128   BASOSABS 0.0 07/24/2023 1128      Latest Ref Rng & Units 07/24/2023   11:28 AM 07/05/2023    2:17 PM 04/14/2023    9:29 AM  CMP  Glucose 70 - 99 mg/dL 83   604   BUN 8 - 27 mg/dL 13   19   Creatinine 5.40 - 1.00 mg/dL 9.81   1.91   Sodium  478 - 144 mmol/L 142   135   Potassium 3.5 - 5.2 mmol/L 3.7   3.6   Chloride 96 - 106 mmol/L 106   103   CO2 20 - 29 mmol/L 21   20   Calcium 8.7 - 10.3 mg/dL 9.1   9.5   Total Protein 6.0 - 8.5 g/dL 6.1   7.3   Total Bilirubin 0.0 - 1.2 mg/dL 0.3  0.4  0.6   Alkaline Phos 44 - 121 IU/L 80   95   AST 0 - 40 IU/L 16   18   ALT 0 - 32 IU/L 15   23     DIAGNOSTIC IMAGING:  I have independently reviewed the relevant imaging and discussed with the patient.   WRAP UP:  All questions were answered. The patient knows to call the clinic with any problems, questions or concerns.  Medical decision making: Moderate  Time spent on visit: I spent 20 minutes counseling the patient face to face. The total time spent in the appointment was 30 minutes and more than 50% was on counseling.  Carnella Guadalajara, PA-C  ***

## 2023-08-02 ENCOUNTER — Inpatient Hospital Stay: Payer: 59

## 2023-08-02 ENCOUNTER — Inpatient Hospital Stay: Payer: 59 | Attending: Hematology | Admitting: Physician Assistant

## 2023-08-02 VITALS — BP 162/91 | HR 93 | Temp 97.7°F | Resp 18

## 2023-08-02 DIAGNOSIS — Z87891 Personal history of nicotine dependence: Secondary | ICD-10-CM | POA: Diagnosis not present

## 2023-08-02 DIAGNOSIS — Z8616 Personal history of COVID-19: Secondary | ICD-10-CM | POA: Diagnosis not present

## 2023-08-02 DIAGNOSIS — D500 Iron deficiency anemia secondary to blood loss (chronic): Secondary | ICD-10-CM

## 2023-08-02 DIAGNOSIS — D649 Anemia, unspecified: Secondary | ICD-10-CM | POA: Diagnosis not present

## 2023-08-02 DIAGNOSIS — R803 Bence Jones proteinuria: Secondary | ICD-10-CM | POA: Diagnosis not present

## 2023-08-02 DIAGNOSIS — M069 Rheumatoid arthritis, unspecified: Secondary | ICD-10-CM | POA: Diagnosis not present

## 2023-08-02 DIAGNOSIS — R627 Adult failure to thrive: Secondary | ICD-10-CM | POA: Insufficient documentation

## 2023-08-02 DIAGNOSIS — M810 Age-related osteoporosis without current pathological fracture: Secondary | ICD-10-CM | POA: Diagnosis not present

## 2023-08-02 DIAGNOSIS — Z803 Family history of malignant neoplasm of breast: Secondary | ICD-10-CM | POA: Diagnosis not present

## 2023-08-02 DIAGNOSIS — E538 Deficiency of other specified B group vitamins: Secondary | ICD-10-CM

## 2023-08-02 LAB — RETICULOCYTES
Immature Retic Fract: 21.7 % — ABNORMAL HIGH (ref 2.3–15.9)
RBC.: 3.49 MIL/uL — ABNORMAL LOW (ref 3.87–5.11)
Retic Count, Absolute: 109.6 10*3/uL (ref 19.0–186.0)
Retic Ct Pct: 3.1 % (ref 0.4–3.1)

## 2023-08-02 LAB — CBC WITH DIFFERENTIAL/PLATELET
Abs Immature Granulocytes: 0.03 10*3/uL (ref 0.00–0.07)
Basophils Absolute: 0 10*3/uL (ref 0.0–0.1)
Basophils Relative: 0 %
Eosinophils Absolute: 0 10*3/uL (ref 0.0–0.5)
Eosinophils Relative: 0 %
HCT: 33.6 % — ABNORMAL LOW (ref 36.0–46.0)
Hemoglobin: 10.6 g/dL — ABNORMAL LOW (ref 12.0–15.0)
Immature Granulocytes: 0 %
Lymphocytes Relative: 30 %
Lymphs Abs: 2.1 10*3/uL (ref 0.7–4.0)
MCH: 30.4 pg (ref 26.0–34.0)
MCHC: 31.5 g/dL (ref 30.0–36.0)
MCV: 96.3 fL (ref 80.0–100.0)
Monocytes Absolute: 0.6 10*3/uL (ref 0.1–1.0)
Monocytes Relative: 8 %
Neutro Abs: 4.3 10*3/uL (ref 1.7–7.7)
Neutrophils Relative %: 62 %
Platelets: 270 10*3/uL (ref 150–400)
RBC: 3.49 MIL/uL — ABNORMAL LOW (ref 3.87–5.11)
RDW: 14.6 % (ref 11.5–15.5)
WBC: 7 10*3/uL (ref 4.0–10.5)
nRBC: 0 % (ref 0.0–0.2)

## 2023-08-02 LAB — IRON AND TIBC
Iron: 66 ug/dL (ref 28–170)
Saturation Ratios: 34 % — ABNORMAL HIGH (ref 10.4–31.8)
TIBC: 196 ug/dL — ABNORMAL LOW (ref 250–450)
UIBC: 130 ug/dL

## 2023-08-02 LAB — SAMPLE TO BLOOD BANK

## 2023-08-02 LAB — FERRITIN: Ferritin: 1056 ng/mL — ABNORMAL HIGH (ref 11–307)

## 2023-08-02 NOTE — Patient Instructions (Signed)
[facility] at [facility] **VISIT SUMMARY & IMPORTANT INSTRUCTIONS **   You were seen today by Rojelio Brenner PA-C for your anemia.    ANEMIA: Your blood counts had gotten better after your leflunomide was stopped.  Unfortunately, your anemia has gotten worse after you were started on Rituxan.  We believe that Rituxan was the main cause of your worsening anemia, although you have some mild chronic anemia related to chronic inflammation. We will check blood counts again in 1 month. After your Rituxan on 09/18/23, we will check your blood count once a week for 4 weeks. I will see you for a follow-up visit 1 month after your next Rituxan (around 10/16/2023)  ** Please continue to work on eating plenty of calories, even when you are in pain.  It is important that you do not lose any more weight.  ** Thank you for trusting me with your healthcare!  I strive to provide all of my patients with quality care at each visit.  If you receive a survey for this visit, I would be so grateful to you for taking the time to provide feedback.  Thank you in advance!  ~ Nelson Noone                   Dr. Doreatha Massed   &   Rojelio Brenner, PA-C   - - - - - - - - - - - - - - - - - -    Thank you for choosing [facility] at [facility] to provide your oncology and hematology care.  To afford each patient quality time with our provider, please arrive at least 15 minutes before your scheduled appointment time.   If you have a lab appointment with the Cancer Center please come in thru the Main Entrance and check in at the main information desk.  You need to re-schedule your appointment should you arrive 10 or more minutes late.  We strive to give you quality time with our providers, and arriving late affects you and other patients whose appointments are after yours.  Also, if you no show three or more times for appointments you may be dismissed from the clinic at the  providers discretion.     Again, thank you for choosing [facility].  Our hope is that these requests will decrease the amount of time that you wait before being seen by our physicians.       _____________________________________________________________  Should you have questions after your visit to [facility], please contact our office at [phone] and follow the prompts.  Our office hours are 8:00 a.m. and 4:30 p.m. Monday - Friday.  Please note that voicemails left after 4:00 p.m. may not be returned until the following business day.  We are closed weekends and major holidays.  You do have access to a nurse 24-7, just call the main number to the clinic [phone] and do not press any options, hold on the line and a nurse will answer the phone.    For prescription refill requests, have your pharmacy contact our office and allow 72 hours.

## 2023-08-08 ENCOUNTER — Inpatient Hospital Stay: Payer: 59 | Admitting: Physician Assistant

## 2023-08-10 ENCOUNTER — Encounter: Payer: Self-pay | Admitting: Neurology

## 2023-08-10 ENCOUNTER — Ambulatory Visit: Payer: 59 | Admitting: Neurology

## 2023-08-10 VITALS — BP 155/79 | HR 86 | Ht 64.0 in | Wt 95.5 lb

## 2023-08-10 DIAGNOSIS — R55 Syncope and collapse: Secondary | ICD-10-CM | POA: Diagnosis not present

## 2023-08-10 DIAGNOSIS — I63511 Cerebral infarction due to unspecified occlusion or stenosis of right middle cerebral artery: Secondary | ICD-10-CM

## 2023-08-10 NOTE — Progress Notes (Signed)
[facility]  PATIENT: Erica Beasley DOB: Jun 11, 1956  REQUESTING CLINICIAN: Mechele Claude, MD HISTORY FROM: Patient and daughter  REASON FOR VISIT: Syncope vs. Seizure    HISTORICAL  CHIEF COMPLAINT:  Chief Complaint  Patient presents with   Follow-up    Rm 12. Patient with friend Eunice Blase. No new symptoms to report at this time, states doing well   INTERVAL HISTORY 08/10/2023 Patient presents today for follow-up, last visit was in May at that time she presented for seizure versus syncope.  We obtained a routine EEG which was normal.  Since then she has not had any seizures or syncope.  She tells me that she is doing overall well but she has lost weight, she does not have an appetite, she can go a couple weeks where she is not eating or drinking well and then her appetite will come back.  Denies any new fall.   INTERVAL HISTORY 01/31/2023:  Patient presents today for follow-up, she is accompanied by her daughter.  She is presenting today for new syncope versus seizure.  Patient reported on April 10, she went to the ED after falling at the grocery store.  Patient reported being in the aisle, she was found tying to pick up a soda but was having difficulty grabbing the bottle.  She felt like something was not right and went to the checkout line and there she was told that she fell and she had a convulsion.  She was taken to the ED denies any urinary incontinence or tongue biting.  Her workup including CT head and MRI brain did not show any acute abnormality.  She was discharged home with neuro follow-up.  Daughter reports that a week ago she had an similar event when she was at the grocery store.  She went to pick up bread, pick up the wrong type of bread and was also complaining of not feeling right.  She was with her grandson and fortunately for patient she did not pass out.  Daughter has been reporting decreased appetite, poor oral intake and anemia and severe weight loss.   She is following up with her PCP and the cancer center regarding those symptoms.   HISTORY OF PRESENT ILLNESS:  This is a 67 year old woman past medical history of COPD, hyperlipidemia hypothyroidism, vitamin B12 deficiency who is presenting for follow-up after being diagnosed with a right internal capsule stroke. Patient presented to the ED due to slurred speech, left facial weakness and left-sided weakness.  She was found to have an acute right internal capsule stroke.  Stroke etiology likely small vessel disease but cannot rule out large vessel.  She was discharged on aspirin and Plavix for total of 21 days and plan will be for patient to continue with aspirin alone.  She is doing physical therapy at home.  She reported her speech is almost back to normal and her strength is getting better.  Denies any new falls.  She is compliant with her medication.  No new concerns.   OTHER MEDICAL CONDITIONS: Hyperlipidemia, hypothyroidism, vitamin B12 deficiency, COPD, CKD,   REVIEW OF SYSTEMS: Full 14 system review of systems performed and negative with exception of: As noted in the HPI   ALLERGIES: Allergies  Allergen Reactions   Adalimumab     Other reaction(s): no response Pt is unaware of this Other reaction(s): no response   Methotrexate Other (See Comments)    Other reaction(s): GI side effects   Sulfasalazine     Other Reaction(s):  Anemia   Upadacitinib Other (See Comments)    Other reaction(s): GI side effects    HOME MEDICATIONS: Outpatient Medications Prior to Visit  Medication Sig Dispense Refill   albuterol (VENTOLIN HFA) 108 (90 Base) MCG/ACT inhaler Inhale 2 puffs into the lungs every 6 (six) hours as needed for wheezing or shortness of breath.     aspirin EC 81 MG tablet Take 1 tablet (81 mg total) by mouth daily. Swallow whole. 30 tablet 12   Blood Glucose Monitoring Suppl DEVI 1 each by Does not apply route in the morning, at noon, and at bedtime. May substitute to any  manufacturer covered by patient's insurance. 1 each 0   calcium carbonate (OSCAL) 1500 (600 Ca) MG TABS tablet Take 1,500 mg by mouth 2 (two) times daily with a meal.     colchicine 0.6 MG tablet Take 0.6 mg by mouth daily.     Cyanocobalamin (B-12) 500 MCG TABS Take 500 mcg by mouth every other day. 90 tablet 1   desvenlafaxine (PRISTIQ) 50 MG 24 hr tablet Take 1 tablet (50 mg total) by mouth daily. 90 tablet 3   diclofenac Sodium (VOLTAREN) 1 % GEL Apply 4 g topically 4 (four) times daily. 4 g 0   etodolac (LODINE) 300 MG capsule Take 300 mg by mouth 2 (two) times daily.     folic acid (FOLVITE) 1 MG tablet TAKE ONE TABLET ONCE DAILY 90 tablet 3   gabapentin (NEURONTIN) 300 MG capsule Take 1 capsule (300 mg total) by mouth 3 (three) times daily. 270 capsule 3   Glucose Blood (BLOOD GLUCOSE TEST STRIPS) STRP 1 each by In Vitro route 2 (two) times daily before a meal. May substitute to any manufacturer covered by patient's insurance. 100 strip 6   hydroxychloroquine (PLAQUENIL) 200 MG tablet Take 200 mg by mouth 2 (two) times daily.     levothyroxine (SYNTHROID) 125 MCG tablet Take 1 tablet (125 mcg total) by mouth every morning. 90 tablet 3   liothyronine (CYTOMEL) 5 MCG tablet Take 2 tablets (10 mcg total) by mouth daily. 180 tablet 3   megestrol (MEGACE) 40 MG/ML suspension Take 10 mLs (400 mg total) by mouth 2 (two) times daily. 480 mL 4   metFORMIN (GLUCOPHAGE-XR) 500 MG 24 hr tablet Take 1 tablet (500 mg total) by mouth 2 (two) times daily with a meal. 200 tablet 1   potassium chloride SA (KLOR-CON M) 20 MEQ tablet Take 1 tablet (20 mEq total) by mouth daily. 30 tablet 6   predniSONE (DELTASONE) 5 MG tablet Take 5-10 mg by mouth daily.     rosuvastatin (CRESTOR) 40 MG tablet Take 1 tablet (40 mg total) by mouth daily. 90 tablet 3   traZODone (DESYREL) 100 MG tablet Take 1 tablet (100 mg total) by mouth at bedtime as needed. for sleep 90 tablet 3   fluticasone (FLONASE) 50 MCG/ACT nasal spray  Place 2 sprays into both nostrils daily. (Patient not taking: Reported on 08/10/2023) 16 g 0   tobramycin-dexamethasone (TOBRADEX) ophthalmic solution Apply 1 drop in affected eye(s) every 2 hours for two days. Then every 4 hours for 5 days. (Patient not taking: Reported on 08/10/2023) 5 mL 0   No facility-administered medications prior to visit.    PAST MEDICAL HISTORY: Past Medical History:  Diagnosis Date   Aortic atherosclerosis (HCC) 11/13/2020   CKD (chronic kidney disease) stage 2, GFR 60-89 ml/min    COPD (chronic obstructive pulmonary disease) (HCC)    COVID-19 06/2019  Depression    Essential hypertension    GERD (gastroesophageal reflux disease)    H/O Clostridium difficile infection 05/03/2019   History of diabetes mellitus    Hx of colonic polyps 05/03/2019   Hyperlipidemia    Hypothyroidism    Neuropathy    Rheumatoid arthritis (HCC)    Stroke (cerebrum) (HCC) 07/09/2022   Stroke (HCC) 07/08/2022   Vitamin B12 deficiency    Vitamin D deficiency     PAST SURGICAL HISTORY: Past Surgical History:  Procedure Laterality Date   BIOPSY  09/16/2019   Procedure: BIOPSY;  Surgeon: Corbin Ade, MD;  Location: [facility];  Service: Endoscopy;;   BIOPSY  04/28/2020   Procedure: BIOPSY;  Surgeon: Corbin Ade, MD;  [facility]: [facility];  Service: Endoscopy;;   BIOPSY  06/08/2020   Procedure: BIOPSY;  Surgeon: Corbin Ade, MD;  Location: [facility];  Service: Endoscopy;;   CATARACT EXTRACTION Bilateral    COLONOSCOPY  09/2010   Dr. Allena Katz: mild diverticulsis in sigmoid colon.    COLONOSCOPY WITH PROPOFOL N/A 09/16/2019   Rourk: Diverticulosis, random colon biopsies negative for microscopic colitis.   COLONOSCOPY WITH PROPOFOL N/A 06/08/2020   Procedure: COLONOSCOPY WITH PROPOFOL;  Surgeon: Corbin Ade, MD;  Location: [facility];  Service: Endoscopy;  Laterality: N/A;  9:15am   ESOPHAGOGASTRODUODENOSCOPY (EGD) WITH PROPOFOL N/A 04/28/2020   Procedure:  ESOPHAGOGASTRODUODENOSCOPY (EGD) WITH PROPOFOL;  Surgeon: Corbin Ade, MD;  [facility]: [facility];  Service: Endoscopy;  Laterality: N/A;  2:15   GIVENS CAPSULE STUDY N/A 08/12/2022   Procedure: GIVENS CAPSULE STUDY;  Surgeon: Corbin Ade, MD;  [facility]: [facility];  Service: Endoscopy;  Laterality: N/A;   YAG LASER APPLICATION Left 07/27/2015   Procedure: YAG LASER APPLICATION;  Surgeon: Susa Simmonds, MD;  Location: [facility];  Service: Ophthalmology;  Laterality: Left;    FAMILY HISTORY: Family History  Problem Relation Age of Onset   Stroke Mother    Heart disease Mother    Diabetes Mother    Breast cancer Mother    Other Father        stomach taken out for some reason   Migraines Daughter    Colon cancer Neg Hx     SOCIAL HISTORY: Social History   Socioeconomic History   Marital status: Widowed    Spouse name: Not on file   Number of children: 1   Years of education: Not on file   Highest education level: 12th grade  Occupational History   Occupation: DISABLED  Tobacco Use   Smoking status: Former    Current packs/day: 0.00    Average packs/day: 1 pack/day for 15.0 years (15.0 ttl pk-yrs)    Types: Cigarettes    Start date: 09/11/2002    Quit date: 09/11/2017    Years since quitting: 5.9   Smokeless tobacco: Former  Building services engineer status: Never Used  Substance and Sexual Activity   Alcohol use: Never   Drug use: Never   Sexual activity: Not Currently  Other Topics Concern   Not on file  Social History Narrative   Disabled, widow, lives with daughter and her family. Patient still drives.    Social Determinants of Health   Financial Resource Strain: Low Risk  (12/28/2022)   Overall Financial Resource Strain (CARDIA)    Difficulty of Paying Living Expenses: Not hard at all  Food Insecurity: No Food Insecurity (12/28/2022)   Hunger Vital Sign  Worried About Programme researcher, broadcasting/film/video in the Last Year: Never true    Ran Out of Food in the Last Year:  Never true  Transportation Needs: No Transportation Needs (12/28/2022)   PRAPARE - Administrator, Civil Service (Medical): No    Lack of Transportation (Non-Medical): No  Physical Activity: Insufficiently Active (12/28/2022)   Exercise Vital Sign    Days of Exercise per Week: 1 day    Minutes of Exercise per Session: 30 min  Stress: Stress Concern Present (12/28/2022)   [employer] of Occupational Health - Occupational Stress Questionnaire    Feeling of Stress : To some extent  Social Connections: Socially Isolated (12/28/2022)   Social Connection and Isolation Panel [NHANES]    Frequency of Communication with Friends and Family: More than three times a week    Frequency of Social Gatherings with Friends and Family: Once a week    Attends Religious Services: Never    Database administrator or Organizations: No    Attends Banker Meetings: Never    Marital Status: Widowed  Intimate Partner Violence: Not At Risk (12/13/2022)   Humiliation, Afraid, Rape, and Kick questionnaire    Fear of Current or Ex-Partner: No    Emotionally Abused: No    Physically Abused: No    Sexually Abused: No    PHYSICAL EXAM  GENERAL EXAM/CONSTITUTIONAL: Vitals:  Vitals:   08/10/23 1452 08/10/23 1458  BP: (!) 145/75 (!) 155/79  Pulse: 88 86  Weight: 95 lb 8 oz (43.3 kg)   Height: 5\' 4"  (1.626 m)    Body mass index is 16.39 kg/m. Wt Readings from Last 3 Encounters:  08/10/23 95 lb 8 oz (43.3 kg)  07/24/23 100 lb (45.4 kg)  07/05/23 101 lb (45.8 kg)   Patient is in no distress; appears thin and frail; neck is supple, thin, frail woman  MUSCULOSKELETAL: Gait, strength, tone, movements noted in Neurologic exam below  NEUROLOGIC: MENTAL STATUS:      No data to display         awake, alert, oriented to person, place and time recent and remote memory intact normal attention and concentration language fluent, comprehension intact, naming intact, no dysarthria fund  of knowledge appropriate  CRANIAL NERVE:  2nd, 3rd, 4th, 6th - visual fields full to confrontation, extraocular muscles intact, no nystagmus 5th - facial sensation symmetric 7th - Left lower facial droop  8th - hearing intact 9th - palate elevates symmetrically, uvula midline 11th - shoulder shrug symmetric 12th - tongue protrusion midline  MOTOR:  normal bulk and tone, full strength in the BUE, BLE .   SENSORY:  normal and symmetric to light touch  COORDINATION:  Normal finger-nose-finger  GAIT/STATION:   Wide based, unsteady    DIAGNOSTIC DATA (LABS, IMAGING, TESTING) - I reviewed patient records, labs, notes, testing and imaging myself where available.  Lab Results  Component Value Date   WBC 7.0 08/02/2023   HGB 10.6 (L) 08/02/2023   HCT 33.6 (L) 08/02/2023   MCV 96.3 08/02/2023   PLT 270 08/02/2023      Component Value Date/Time   NA 142 07/24/2023 1128   K 3.7 07/24/2023 1128   CL 106 07/24/2023 1128   CO2 21 07/24/2023 1128   GLUCOSE 83 07/24/2023 1128   GLUCOSE 248 (H) 04/14/2023 0929   BUN 13 07/24/2023 1128   CREATININE 0.83 07/24/2023 1128   CREATININE 0.64 02/01/2019 1333   CALCIUM 9.1 07/24/2023 1128  CALCIUM 9.1 03/10/2020 1156   PROT 6.1 07/24/2023 1128   ALBUMIN 3.6 (L) 07/24/2023 1128   AST 16 07/24/2023 1128   ALT 15 07/24/2023 1128   ALKPHOS 80 07/24/2023 1128   BILITOT 0.3 07/24/2023 1128   GFRNONAA >60 04/14/2023 0929   GFRAA 72 11/13/2020 1129   Lab Results  Component Value Date   CHOL 112 07/24/2023   HDL 61 07/24/2023   LDLCALC 33 07/24/2023   TRIG 98 07/24/2023   CHOLHDL 1.8 07/24/2023   Lab Results  Component Value Date   HGBA1C 7.1 (H) 07/24/2023   Lab Results  Component Value Date   VITAMINB12 820 06/28/2023   Lab Results  Component Value Date   TSH 0.040 (L) 07/24/2023    MRI Brain/MRA Head  1. 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 2. No  intracranial large vessel occlusion. Mild stenosis in the right-greater-than-left cavernous and supraclinoid ICAs  Carotid US  1. Atherosclerotic plaque involving bilateral carotid arteries most prominent at the carotid bulbs. Estimated degree of stenosis in the internal carotid arteries is 50-69% bilaterally. 2. Patent vertebral arteries with antegrade flow.  MRI Brain 01/04/2023 1. No acute intracranial abnormality. 2. Small remote lacunar infarct at the right basal ganglia.  Routine EEG 02/15/2023: Normal     ASSESSMENT AND PLAN  67 y.o. year old female with history of hyperlipidemia, hypothyroidism, COPD, previous right basal ganglia stroke who is presenting for follow-up for seizure versus syncope.  Her routine EEG was completed and was normal.  Since then, she has not had any additional seizure-like activity or syncopal episode.  She tells me that her appetite is poor, and she has lost weight.  Her doctors is planning a evaluation with GI.  Advised her to continue her current medications, to increase her food and water intake, she can try Pedialyte for hydration.  Again continue to follow-up with PCP and return as needed.  She voiced understanding.   1. Syncope, unspecified syncope type   2. Cerebrovascular accident (CVA) due to occlusion of right middle cerebral artery [facility])     Patient Instructions  Continue current medications  Continue to follow up with PCP  Increase water intake, can consider Pedialyte  Return as needed   No orders of the defined types were placed in this encounter.   No orders of the defined types were placed in this encounter.   Return if symptoms worsen or fail to improve.   Windell Norfolk, MD 08/10/2023, 3:50 PM  [facility] [address], [address] [place], [place] [address] [phone]

## 2023-08-10 NOTE — Patient Instructions (Addendum)
Continue current medications  Continue to follow up with PCP  Increase water intake, can consider Pedialyte  Return as needed

## 2023-08-21 DIAGNOSIS — M79676 Pain in unspecified toe(s): Secondary | ICD-10-CM | POA: Diagnosis not present

## 2023-08-21 DIAGNOSIS — L409 Psoriasis, unspecified: Secondary | ICD-10-CM | POA: Diagnosis not present

## 2023-08-21 DIAGNOSIS — Z79899 Other long term (current) drug therapy: Secondary | ICD-10-CM | POA: Diagnosis not present

## 2023-08-21 DIAGNOSIS — M1991 Primary osteoarthritis, unspecified site: Secondary | ICD-10-CM | POA: Diagnosis not present

## 2023-08-21 DIAGNOSIS — L4059 Other psoriatic arthropathy: Secondary | ICD-10-CM | POA: Diagnosis not present

## 2023-08-21 DIAGNOSIS — M25561 Pain in right knee: Secondary | ICD-10-CM | POA: Diagnosis not present

## 2023-08-21 DIAGNOSIS — M0579 Rheumatoid arthritis with rheumatoid factor of multiple sites without organ or systems involvement: Secondary | ICD-10-CM | POA: Diagnosis not present

## 2023-08-21 DIAGNOSIS — D649 Anemia, unspecified: Secondary | ICD-10-CM | POA: Diagnosis not present

## 2023-08-21 DIAGNOSIS — M25562 Pain in left knee: Secondary | ICD-10-CM | POA: Diagnosis not present

## 2023-08-21 DIAGNOSIS — Z681 Body mass index (BMI) 19 or less, adult: Secondary | ICD-10-CM | POA: Diagnosis not present

## 2023-08-29 ENCOUNTER — Encounter: Payer: Self-pay | Admitting: Internal Medicine

## 2023-08-29 ENCOUNTER — Ambulatory Visit (INDEPENDENT_AMBULATORY_CARE_PROVIDER_SITE_OTHER): Payer: 59 | Admitting: Family Medicine

## 2023-08-29 ENCOUNTER — Encounter: Payer: Self-pay | Admitting: Family Medicine

## 2023-08-29 ENCOUNTER — Ambulatory Visit (INDEPENDENT_AMBULATORY_CARE_PROVIDER_SITE_OTHER): Payer: 59 | Admitting: Internal Medicine

## 2023-08-29 VITALS — BP 146/83 | HR 82 | Temp 97.9°F | Ht 64.0 in | Wt 97.2 lb

## 2023-08-29 VITALS — BP 148/82 | HR 79 | Temp 97.4°F | Ht 64.0 in | Wt 97.4 lb

## 2023-08-29 DIAGNOSIS — D649 Anemia, unspecified: Secondary | ICD-10-CM

## 2023-08-29 DIAGNOSIS — R300 Dysuria: Secondary | ICD-10-CM | POA: Diagnosis not present

## 2023-08-29 LAB — MICROSCOPIC EXAMINATION
RBC, Urine: NONE SEEN /[HPF] (ref 0–2)
Renal Epithel, UA: NONE SEEN /[HPF]
Yeast, UA: NONE SEEN

## 2023-08-29 LAB — URINALYSIS, COMPLETE
Glucose, UA: NEGATIVE
Ketones, UA: NEGATIVE
Nitrite, UA: NEGATIVE
RBC, UA: NEGATIVE
Specific Gravity, UA: 1.02 (ref 1.005–1.030)
Urobilinogen, Ur: 0.2 mg/dL (ref 0.2–1.0)
pH, UA: 5.5 (ref 5.0–7.5)

## 2023-08-29 MED ORDER — CIPROFLOXACIN HCL 500 MG PO TABS: 500.0000 mg | ORAL_TABLET | Freq: Two times a day (BID) | ORAL | 0 refills | Status: DC

## 2023-08-29 NOTE — Progress Notes (Signed)
Subjective:  Patient ID: Erica Beasley, female    DOB: [birth year]  Age: 67 y.o. MRN: [id number]  CC: Dysuria   HPI [name] presents for frequency especially at night for two weeks. Denies fever . No flank pain. No burning with urination.  No nausea, vomiting. Had amoxil for silar sx 6 weeks ago.      08/29/2023    3:31 PM 07/24/2023   10:16 AM 06/14/2023   11:10 AM  Depression screen PHQ 2/9  Decreased Interest 0 0 0  Down, Depressed, Hopeless 0 0 0  PHQ - 2 Score 0 0 0  Altered sleeping  0   Tired, decreased energy  3   Change in appetite  0   Feeling bad or failure about yourself   0   Trouble concentrating  0   Moving slowly or fidgety/restless  0   Suicidal thoughts  0   PHQ-9 Score  3   Difficult doing work/chores  Not difficult at all     History Robertine has a past medical history of Aortic atherosclerosis (HCC) (11/13/2020), CKD (chronic kidney disease) stage 2, GFR 60-89 ml/min, COPD (chronic obstructive pulmonary disease) (HCC), COVID-19 (06/2019), Depression, Essential hypertension, GERD (gastroesophageal reflux disease), H/O Clostridium difficile infection (05/03/2019), History of diabetes mellitus, colonic polyps (05/03/2019), Hyperlipidemia, Hypothyroidism, Neuropathy, Rheumatoid arthritis (HCC), 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   She has a past surgical history that includes Yag laser application (Left, 07/27/2015); Colonoscopy (09/2010); Colonoscopy with propofol (N/A, 09/16/2019); biopsy (09/16/2019); Esophagogastroduodenoscopy (egd) with propofol (N/A, 04/28/2020); biopsy (04/28/2020); Cataract extraction (Bilateral); Colonoscopy with propofol (N/A, 06/08/2020); biopsy (06/08/2020); and Givens capsule study (N/A, 08/12/2022).   Her family history includes Breast cancer in her mother; Diabetes in her mother; Heart disease in her mother; Migraines in her daughter; Other in her father; Stroke  in her mother.She reports that she quit smoking about 5 years ago. Her smoking use included cigarettes. She started smoking about 20 years ago. She has a 15 pack-year smoking history. She has quit using smokeless tobacco. She reports that she does not drink alcohol and does not use drugs.    ROS Review of Systems  Constitutional:  Negative for chills, diaphoresis and fever.  HENT:  Negative for congestion.   Eyes:  Negative for visual disturbance.  Respiratory:  Negative for cough and shortness of breath.   Cardiovascular:  Negative for chest pain and palpitations.  Gastrointestinal:  Negative for constipation, diarrhea and nausea.  Genitourinary:  Positive for dysuria, frequency and urgency. Negative for decreased urine volume, flank pain, hematuria, menstrual problem and pelvic pain.  Musculoskeletal:  Negative for arthralgias and joint swelling.  Skin:  Negative for rash.  Neurological:  Negative for dizziness and numbness.    Objective:  BP (!) 148/82   Pulse 79   Temp (!) 97.4 F (36.3 C)   Ht 5\' 4"  (1.626 m)   Wt 97 lb 6.4 oz (44.2 kg)   SpO2 100%   BMI 16.72 kg/m   BP Readings from Last 3 Encounters:  08/29/23 (!) 148/82  08/29/23 (!) 146/83  08/10/23 (!) 155/79    Wt Readings from Last 3 Encounters:  08/29/23 97 lb 6.4 oz (44.2 kg)  08/29/23 97 lb 3.2 oz (44.1 kg)  08/10/23 95 lb 8 oz (43.3 kg)     Physical Exam Constitutional:      Appearance: She is well-developed.  HENT:     Head: Normocephalic and  atraumatic.  Cardiovascular:     Rate and Rhythm: Normal rate and regular rhythm.     Heart sounds: No murmur heard. Pulmonary:     Effort: Pulmonary effort is normal.     Breath sounds: Normal breath sounds.  Abdominal:     General: Bowel sounds are normal.     Palpations: Abdomen is soft. There is no mass.     Tenderness: There is no abdominal tenderness. There is no guarding or rebound.  Musculoskeletal:        General: No tenderness.  Skin:     General: Skin is warm and dry.  Neurological:     Mental Status: She is alert and oriented to person, place, and time.  Psychiatric:        Behavior: Behavior normal.       Assessment & Plan:   Rolando was seen today for dysuria.  Diagnoses and all orders for this visit:  Dysuria -     Urinalysis, Complete -     Urine Culture  Other orders -     ciprofloxacin (CIPRO) 500 MG tablet; Take 1 tablet (500 mg total) by mouth 2 (two) times daily.       I am having [name] start on ciprofloxacin. I am also having her maintain her hydroxychloroquine, albuterol, calcium carbonate, aspirin EC, gabapentin, levothyroxine, liothyronine, traZODone, folic acid, Blood Glucose Monitoring Suppl, BLOOD GLUCOSE TEST STRIPS, desvenlafaxine, megestrol, metFORMIN, B-12, potassium chloride SA, predniSONE, colchicine, etodolac, and rosuvastatin.  Allergies as of 08/29/2023       Reactions   Adalimumab    Other reaction(s): no response Pt is unaware of this Other reaction(s): no response   Methotrexate Other (See Comments)   Other reaction(s): GI side effects   Sulfasalazine    Other Reaction(s): Anemia   Upadacitinib Other (See Comments)   Other reaction(s): GI side effects        Medication List        Accurate as of August 29, 2023  4:00 PM. If you have any questions, ask your nurse or doctor.          STOP taking these medications    diclofenac Sodium 1 % Gel Commonly known as: Voltaren Stopped by: Eula Listen   fluticasone 50 MCG/ACT nasal spray Commonly known as: FLONASE Stopped by: Eula Listen   tobramycin-dexamethasone ophthalmic solution Commonly known as: TobraDex Stopped by: Eula Listen       TAKE these medications    albuterol 108 (90 Base) MCG/ACT inhaler Commonly known as: VENTOLIN HFA Inhale 2 puffs into the lungs every 6 (six) hours as needed for wheezing or shortness of breath.   aspirin EC 81 MG tablet Take 1 tablet (81 mg total) by  mouth daily. Swallow whole.   B-12 500 MCG Tabs Take 500 mcg by mouth every other day.   Blood Glucose Monitoring Suppl Devi 1 each by Does not apply route in the morning, at noon, and at bedtime. May substitute to any manufacturer covered by patient's insurance.   BLOOD GLUCOSE TEST STRIPS Strp 1 each by In Vitro route 2 (two) times daily before a meal. May substitute to any manufacturer covered by patient's insurance.   calcium carbonate 1500 (600 Ca) MG Tabs tablet Commonly known as: OSCAL Take 1,500 mg by mouth 2 (two) times daily with a meal.   ciprofloxacin 500 MG tablet Commonly known as: Cipro Take 1 tablet (500 mg total) by mouth 2 (two) times daily. Started by: [employer]  colchicine 0.6 MG tablet Take 0.6 mg by mouth daily.   desvenlafaxine 50 MG 24 hr tablet Commonly known as: Pristiq Take 1 tablet (50 mg total) by mouth daily.   etodolac 300 MG capsule Commonly known as: LODINE Take 300 mg by mouth 2 (two) times daily.   folic acid 1 MG tablet Commonly known as: FOLVITE TAKE ONE TABLET ONCE DAILY   gabapentin 300 MG capsule Commonly known as: NEURONTIN Take 1 capsule (300 mg total) by mouth 3 (three) times daily.   hydroxychloroquine 200 MG tablet Commonly known as: PLAQUENIL Take 200 mg by mouth 2 (two) times daily.   levothyroxine 125 MCG tablet Commonly known as: SYNTHROID Take 1 tablet (125 mcg total) by mouth every morning.   liothyronine 5 MCG tablet Commonly known as: CYTOMEL Take 2 tablets (10 mcg total) by mouth daily.   megestrol 40 MG/ML suspension Commonly known as: MEGACE Take 10 mLs (400 mg total) by mouth 2 (two) times daily.   metFORMIN 500 MG 24 hr tablet Commonly known as: GLUCOPHAGE-XR Take 1 tablet (500 mg total) by mouth 2 (two) times daily with a meal.   potassium chloride SA 20 MEQ tablet Commonly known as: KLOR-CON M Take 1 tablet (20 mEq total) by mouth daily.   predniSONE 5 MG tablet Commonly known as:  DELTASONE Take 5-10 mg by mouth daily.   rosuvastatin 40 MG tablet Commonly known as: CRESTOR Take 1 tablet (40 mg total) by mouth daily.   traZODone 100 MG tablet Commonly known as: DESYREL Take 1 tablet (100 mg total) by mouth at bedtime as needed. for sleep         Follow-up: Return if symptoms worsen or fail to improve.  Mechele Claude, M.D.

## 2023-08-29 NOTE — Progress Notes (Signed)
Primary Care Physician:  Mechele Claude, MD Primary Gastroenterologist:  Dr.   Pre-Procedure History & Physical: HPI:  Erica Beasley is a 67 y.o. female here for follow-up of anemia.  There is never been any evidence of GI bleeding.  She has had a thorough GI evaluation including EGD (no H. pylori by histology) capsule study of the small bowel and colonoscopy. 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  I have her down 7 pounds from her weight when seen earlier this year.  She states she is doing better far as her oral intake is concerned.  She is being actively seen by rheumatology and hematology.  She tells me she is felt to have an inflammatory anemia may have overlapping psoriatic, rheumatoid arthritis and gout.  No GERD issues at this time.  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 Allena Katz: mild diverticulsis in sigmoid colon.    COLONOSCOPY WITH PROPOFOL N/A 09/16/2019   Letitia Sabala: Diverticulosis, random colon biopsies negative for microscopic colitis.   COLONOSCOPY WITH PROPOFOL N/A 06/08/2020   Procedure: COLONOSCOPY WITH PROPOFOL;  Surgeon: Corbin Ade, MD;  Location: [facility];  Service: Endoscopy;  Laterality: N/A;  9:15am   ESOPHAGOGASTRODUODENOSCOPY (EGD) WITH PROPOFOL N/A 04/28/2020   Procedure: ESOPHAGOGASTRODUODENOSCOPY (EGD) WITH PROPOFOL;  Surgeon: Corbin Ade, MD;  Location: [facility];  Service: Endoscopy;  Laterality: N/A;  2:15   GIVENS CAPSULE STUDY N/A 08/12/2022   Procedure: GIVENS CAPSULE STUDY;  Surgeon: Corbin Ade, MD;  Location: [facility];  Service: Endoscopy;  Laterality: N/A;   YAG LASER APPLICATION Left 07/27/2015   Procedure: YAG LASER APPLICATION;  Surgeon: Susa Simmonds, MD;  Location: [facility];  Service: Ophthalmology;  Laterality: Left;    Prior to Admission medications   Medication Sig Start Date End Date Taking? Authorizing Provider  albuterol (VENTOLIN HFA) 108 (90 Base) MCG/ACT inhaler Inhale 2 puffs into the lungs every 6 (six) hours as needed for wheezing or shortness of breath.   Yes [provider]  aspirin EC 81 MG tablet Take 1 tablet (81 mg total) by mouth daily. Swallow whole. 07/11/22  Yes Shahmehdi, Gemma Payor, MD  Blood Glucose Monitoring Suppl DEVI 1 each  by Does not apply route in the morning, at noon, and at bedtime. May substitute to any manufacturer covered by patient's insurance. 11/30/22  Yes Mechele Claude, MD  calcium carbonate (OSCAL) 1500 (600 Ca) MG TABS tablet Take 1,500 mg by mouth 2 (two) times daily with a meal.   Yes [provider]  colchicine 0.6 MG tablet Take 0.6 mg by mouth daily. 06/21/23  Yes [provider]  Cyanocobalamin (B-12) 500 MCG TABS Take 500 mcg by mouth every other day. 04/25/23   Yes Pennington, Rebekah M, PA-C  desvenlafaxine (PRISTIQ) 50 MG 24 hr tablet Take 1 tablet (50 mg total) by mouth daily. 12/13/22  Yes Stacks, Broadus John, MD  etodolac (LODINE) 300 MG capsule Take 300 mg by mouth 2 (two) times daily. 06/21/23  Yes [provider]  folic acid (FOLVITE) 1 MG tablet TAKE ONE TABLET ONCE DAILY 11/28/22  Yes Doreatha Massed, MD  gabapentin (NEURONTIN) 300 MG capsule Take 1 capsule (300 mg total) by mouth 3 (three) times daily. 10/24/22  Yes Mechele Claude, MD  Glucose Blood (BLOOD GLUCOSE TEST STRIPS) STRP 1 each by In Vitro route 2 (two) times daily before a meal. May substitute to any manufacturer covered by patient's insurance. 11/30/22 11/25/23 Yes Stacks, Broadus John, MD  hydroxychloroquine (PLAQUENIL) 200 MG tablet Take 200 mg by mouth 2 (two) times daily.   Yes [provider]  levothyroxine (SYNTHROID) 125 MCG tablet Take 1 tablet (125 mcg total) by mouth every morning. 10/24/22  Yes Stacks, Broadus John, MD  liothyronine (CYTOMEL) 5 MCG tablet Take 2 tablets (10 mcg total) by mouth daily. 10/24/22  Yes Mechele Claude, MD  megestrol (MEGACE) 40 MG/ML suspension Take 10 mLs (400 mg total) by mouth 2 (two) times daily. 02/27/23  Yes Doreatha Massed, MD  metFORMIN (GLUCOPHAGE-XR) 500 MG 24 hr tablet Take 1 tablet (500 mg total) by mouth 2 (two) times daily with a meal. 02/28/23  Yes Stacks, Broadus John, MD  potassium chloride SA (KLOR-CON M) 20 MEQ tablet Take 1 tablet (20 mEq total) by mouth daily. 05/08/23  Yes Doreatha Massed, MD  predniSONE (DELTASONE) 5 MG tablet Take 5-10 mg by mouth daily. 05/08/23  Yes [provider]  rosuvastatin (CRESTOR) 40 MG tablet Take 1 tablet (40 mg total) by mouth daily. 07/24/23  Yes Mechele Claude, MD  traZODone (DESYREL) 100 MG tablet Take 1 tablet (100 mg total) by mouth at bedtime as needed. for sleep 10/24/22  Yes Mechele Claude, MD    Allergies as of 08/29/2023 - Review Complete 08/29/2023  Allergen Reaction Noted    Adalimumab  12/08/2020   Methotrexate Other (See Comments) 10/04/2021   Sulfasalazine  11/29/2022   Upadacitinib Other (See Comments) 10/04/2021    Family History  Problem Relation Age of Onset   Stroke Mother    Heart disease Mother    Diabetes Mother    Breast cancer Mother    Other Father        stomach taken out for some reason   Migraines Daughter    Colon cancer Neg Hx     Social History   Socioeconomic History   Marital status: Widowed    Spouse name: Not on file   Number of children: 1   Years of education: Not on file   Highest education level: 12th grade  Occupational History   Occupation: DISABLED  Tobacco Use   Smoking status: Former    Current packs/day: 0.00    Average packs/day: 1 pack/day for 15.0 years (15.0  ttl pk-yrs)    Types: Cigarettes    Start date: 09/11/2002    Quit date: 09/11/2017    Years since quitting: 5.9   Smokeless tobacco: Former  Building services engineer status: Never Used  Substance and Sexual Activity   Alcohol use: Never   Drug use: Never   Sexual activity: Not Currently  Other Topics Concern   Not on file  Social History Narrative   Disabled, widow, lives with daughter and her family. Patient still drives.    Social Determinants of Health   Financial Resource Strain: Low Risk  (12/28/2022)   Overall Financial Resource Strain (CARDIA)    Difficulty of Paying Living Expenses: Not hard at all  Food Insecurity: No Food Insecurity (12/28/2022)   Hunger Vital Sign    Worried About Running Out of Food in the Last Year: Never true    Ran Out of Food in the Last Year: Never true  Transportation Needs: No Transportation Needs (12/28/2022)   PRAPARE - Administrator, Civil Service (Medical): No    Lack of Transportation (Non-Medical): No  Physical Activity: Insufficiently Active (12/28/2022)   Exercise Vital Sign    Days of Exercise per Week: 1 day    Minutes of Exercise per Session: 30 min  Stress: Stress Concern Present  (12/28/2022)   [employer] of Occupational Health - Occupational Stress Questionnaire    Feeling of Stress : To some extent  Social Connections: Socially Isolated (12/28/2022)   Social Connection and Isolation Panel [NHANES]    Frequency of Communication with Friends and Family: More than three times a week    Frequency of Social Gatherings with Friends and Family: Once a week    Attends Religious Services: Never    Database administrator or Organizations: No    Attends Banker Meetings: Never    Marital Status: Widowed  Intimate Partner Violence: Not At Risk (12/13/2022)   Humiliation, Afraid, Rape, and Kick questionnaire    Fear of Current or Ex-Partner: No    Emotionally Abused: No    Physically Abused: No    Sexually Abused: No    Review of Systems: See HPI, otherwise negative ROS  Physical Exam: BP (!) 146/83 (BP Location: Right Arm, Patient Position: Sitting, Cuff Size: Normal)   Pulse 82   Temp 97.9 F (36.6 C) (Oral)   Ht 5\' 4"  (1.626 m)   Wt 97 lb 3.2 oz (44.1 kg)   SpO2 100%   BMI 16.68 kg/m  General:   Alert,  Well-developed, well-nourished, pleasant and cooperative in NAD Neck:  Supple; no masses or thyromegaly. No significant cervical adenopathy. Lungs:  Clear throughout to auscultation.   No wheezes, crackles, or rhonchi. No acute distress. Heart:  Regular rate and rhythm; no murmurs, clicks, rubs,  or gallops. Abdomen: Non-distended, normal bowel sounds.  Soft and nontender without appreciable mass or hepatosplenomegaly.   Impression/Plan: Pleasant 67 year old lady with longstanding anemia.  No evidence of GI bleeding. She has had a thorough evaluation with EGD capsule and colonoscopy.  No H. pylori.  Anemia most likely inflammatory in nature secondary to her other comorbidities.  She has no active GI issues at this time.  Recommendations:   Keep appointments with other providers.  Will be glad to see back in the future should any GI issues  arise.    Notice: This dictation was prepared with Dragon dictation along with smaller phrase technology. Any transcriptional errors that result from this process  are unintentional and may not be corrected upon review.

## 2023-08-29 NOTE — Patient Instructions (Signed)
It was good to see you again today!  As discussed, you have had a thorough GI workup.  You have no active GI problems at this time.  You are seeing multiple other providers.  At this time, you do not need further GI workup.  You should follow-up with your other care providers.  Should any GI problems arise in the future, please do not hesitate to call us

## 2023-08-30 ENCOUNTER — Inpatient Hospital Stay: Payer: 59 | Attending: Hematology

## 2023-08-30 DIAGNOSIS — D649 Anemia, unspecified: Secondary | ICD-10-CM | POA: Diagnosis not present

## 2023-08-30 LAB — CBC
HCT: 31.8 % — ABNORMAL LOW (ref 36.0–46.0)
Hemoglobin: 9.7 g/dL — ABNORMAL LOW (ref 12.0–15.0)
MCH: 29.3 pg (ref 26.0–34.0)
MCHC: 30.5 g/dL (ref 30.0–36.0)
MCV: 96.1 fL (ref 80.0–100.0)
Platelets: 281 10*3/uL (ref 150–400)
RBC: 3.31 MIL/uL — ABNORMAL LOW (ref 3.87–5.11)
RDW: 13.9 % (ref 11.5–15.5)
WBC: 7.9 10*3/uL (ref 4.0–10.5)
nRBC: 0 % (ref 0.0–0.2)

## 2023-09-02 LAB — URINE CULTURE

## 2023-09-08 ENCOUNTER — Ambulatory Visit (HOSPITAL_COMMUNITY)
Admission: RE | Admit: 2023-09-08 | Discharge: 2023-09-08 | Disposition: A | Discharge status: Home / Self Care | Payer: 59 | Source: Ambulatory Visit | Attending: Family Medicine | Admitting: Family Medicine

## 2023-09-08 ENCOUNTER — Other Ambulatory Visit (HOSPITAL_COMMUNITY): Payer: Self-pay | Admitting: Nurse Practitioner

## 2023-09-08 DIAGNOSIS — Z1231 Encounter for screening mammogram for malignant neoplasm of breast: Secondary | ICD-10-CM | POA: Diagnosis not present

## 2023-09-11 ENCOUNTER — Other Ambulatory Visit: Payer: Self-pay | Admitting: Family Medicine

## 2023-09-11 DIAGNOSIS — F339 Major depressive disorder, recurrent, unspecified: Secondary | ICD-10-CM

## 2023-09-18 DIAGNOSIS — M0579 Rheumatoid arthritis with rheumatoid factor of multiple sites without organ or systems involvement: Secondary | ICD-10-CM | POA: Diagnosis not present

## 2023-09-25 ENCOUNTER — Inpatient Hospital Stay: Payer: 59

## 2023-09-25 DIAGNOSIS — D500 Iron deficiency anemia secondary to blood loss (chronic): Secondary | ICD-10-CM

## 2023-09-25 DIAGNOSIS — D649 Anemia, unspecified: Secondary | ICD-10-CM

## 2023-09-25 DIAGNOSIS — R803 Bence Jones proteinuria: Secondary | ICD-10-CM

## 2023-09-25 DIAGNOSIS — D472 Monoclonal gammopathy: Secondary | ICD-10-CM

## 2023-09-25 LAB — SAMPLE TO BLOOD BANK

## 2023-09-25 LAB — CBC
HCT: 33.3 % — ABNORMAL LOW (ref 36.0–46.0)
Hemoglobin: 10.2 g/dL — ABNORMAL LOW (ref 12.0–15.0)
MCH: 29.6 pg (ref 26.0–34.0)
MCHC: 30.6 g/dL (ref 30.0–36.0)
MCV: 96.5 fL (ref 80.0–100.0)
Platelets: 217 10*3/uL (ref 150–400)
RBC: 3.45 MIL/uL — ABNORMAL LOW (ref 3.87–5.11)
RDW: 13.8 % (ref 11.5–15.5)
WBC: 8.9 10*3/uL (ref 4.0–10.5)
nRBC: 0 % (ref 0.0–0.2)

## 2023-10-02 ENCOUNTER — Inpatient Hospital Stay: Payer: 59

## 2023-10-03 ENCOUNTER — Inpatient Hospital Stay: Payer: 59 | Attending: Hematology

## 2023-10-03 DIAGNOSIS — M810 Age-related osteoporosis without current pathological fracture: Secondary | ICD-10-CM | POA: Insufficient documentation

## 2023-10-03 DIAGNOSIS — R803 Bence Jones proteinuria: Secondary | ICD-10-CM | POA: Diagnosis not present

## 2023-10-03 DIAGNOSIS — Z860100 Personal history of colon polyps, unspecified: Secondary | ICD-10-CM | POA: Insufficient documentation

## 2023-10-03 DIAGNOSIS — D508 Other iron deficiency anemias: Secondary | ICD-10-CM | POA: Diagnosis not present

## 2023-10-03 DIAGNOSIS — Z87891 Personal history of nicotine dependence: Secondary | ICD-10-CM | POA: Insufficient documentation

## 2023-10-03 DIAGNOSIS — M069 Rheumatoid arthritis, unspecified: Secondary | ICD-10-CM | POA: Diagnosis not present

## 2023-10-03 DIAGNOSIS — Z803 Family history of malignant neoplasm of breast: Secondary | ICD-10-CM | POA: Insufficient documentation

## 2023-10-03 DIAGNOSIS — Z8673 Personal history of transient ischemic attack (TIA), and cerebral infarction without residual deficits: Secondary | ICD-10-CM | POA: Diagnosis not present

## 2023-10-03 DIAGNOSIS — R627 Adult failure to thrive: Secondary | ICD-10-CM | POA: Insufficient documentation

## 2023-10-03 DIAGNOSIS — Z8616 Personal history of COVID-19: Secondary | ICD-10-CM | POA: Insufficient documentation

## 2023-10-03 DIAGNOSIS — D649 Anemia, unspecified: Secondary | ICD-10-CM

## 2023-10-03 LAB — CBC
HCT: 34.1 % — ABNORMAL LOW (ref 36.0–46.0)
Hemoglobin: 10.9 g/dL — ABNORMAL LOW (ref 12.0–15.0)
MCH: 30.2 pg (ref 26.0–34.0)
MCHC: 32 g/dL (ref 30.0–36.0)
MCV: 94.5 fL (ref 80.0–100.0)
Platelets: 253 10*3/uL (ref 150–400)
RBC: 3.61 MIL/uL — ABNORMAL LOW (ref 3.87–5.11)
RDW: 13.5 % (ref 11.5–15.5)
WBC: 8.3 10*3/uL (ref 4.0–10.5)
nRBC: 0 % (ref 0.0–0.2)

## 2023-10-04 DIAGNOSIS — M0579 Rheumatoid arthritis with rheumatoid factor of multiple sites without organ or systems involvement: Secondary | ICD-10-CM | POA: Diagnosis not present

## 2023-10-09 ENCOUNTER — Inpatient Hospital Stay: Payer: 59

## 2023-10-09 DIAGNOSIS — D508 Other iron deficiency anemias: Secondary | ICD-10-CM | POA: Diagnosis not present

## 2023-10-09 DIAGNOSIS — Z860100 Personal history of colon polyps, unspecified: Secondary | ICD-10-CM | POA: Diagnosis not present

## 2023-10-09 DIAGNOSIS — Z8673 Personal history of transient ischemic attack (TIA), and cerebral infarction without residual deficits: Secondary | ICD-10-CM | POA: Diagnosis not present

## 2023-10-09 DIAGNOSIS — Z8616 Personal history of COVID-19: Secondary | ICD-10-CM | POA: Diagnosis not present

## 2023-10-09 DIAGNOSIS — R627 Adult failure to thrive: Secondary | ICD-10-CM | POA: Diagnosis not present

## 2023-10-09 DIAGNOSIS — Z803 Family history of malignant neoplasm of breast: Secondary | ICD-10-CM | POA: Diagnosis not present

## 2023-10-09 DIAGNOSIS — D649 Anemia, unspecified: Secondary | ICD-10-CM

## 2023-10-09 DIAGNOSIS — M069 Rheumatoid arthritis, unspecified: Secondary | ICD-10-CM | POA: Diagnosis not present

## 2023-10-09 DIAGNOSIS — R803 Bence Jones proteinuria: Secondary | ICD-10-CM | POA: Diagnosis not present

## 2023-10-09 DIAGNOSIS — M810 Age-related osteoporosis without current pathological fracture: Secondary | ICD-10-CM | POA: Diagnosis not present

## 2023-10-09 DIAGNOSIS — Z87891 Personal history of nicotine dependence: Secondary | ICD-10-CM | POA: Diagnosis not present

## 2023-10-09 LAB — VITAMIN B12: Vitamin B-12: 628 pg/mL (ref 180–914)

## 2023-10-09 LAB — COMPREHENSIVE METABOLIC PANEL
ALT: 27 U/L (ref 0–44)
AST: 16 U/L (ref 15–41)
Albumin: 2.9 g/dL — ABNORMAL LOW (ref 3.5–5.0)
Alkaline Phosphatase: 82 U/L (ref 38–126)
Anion gap: 9 (ref 5–15)
BUN: 20 mg/dL (ref 8–23)
CO2: 22 mmol/L (ref 22–32)
Calcium: 9.2 mg/dL (ref 8.9–10.3)
Chloride: 102 mmol/L (ref 98–111)
Creatinine, Ser: 1.09 mg/dL — ABNORMAL HIGH (ref 0.44–1.00)
GFR, Estimated: 56 mL/min — ABNORMAL LOW (ref >60)
Glucose, Bld: 328 mg/dL — ABNORMAL HIGH (ref 70–99)
Potassium: 4 mmol/L (ref 3.5–5.1)
Sodium: 133 mmol/L — ABNORMAL LOW (ref 135–145)
Total Bilirubin: 0.3 mg/dL (ref 0.0–1.2)
Total Protein: 6.4 g/dL — ABNORMAL LOW (ref 6.5–8.1)

## 2023-10-09 LAB — CBC WITH DIFFERENTIAL/PLATELET
Abs Immature Granulocytes: 0.05 10*3/uL (ref 0.00–0.07)
Basophils Absolute: 0 10*3/uL (ref 0.0–0.1)
Basophils Relative: 0 %
Eosinophils Absolute: 0.2 10*3/uL (ref 0.0–0.5)
Eosinophils Relative: 2 %
HCT: 38.1 % (ref 36.0–46.0)
Hemoglobin: 11.5 g/dL — ABNORMAL LOW (ref 12.0–15.0)
Immature Granulocytes: 1 %
Lymphocytes Relative: 27 %
Lymphs Abs: 2.1 10*3/uL (ref 0.7–4.0)
MCH: 28.3 pg (ref 26.0–34.0)
MCHC: 30.2 g/dL (ref 30.0–36.0)
MCV: 93.8 fL (ref 80.0–100.0)
Monocytes Absolute: 0.5 10*3/uL (ref 0.1–1.0)
Monocytes Relative: 7 %
Neutro Abs: 4.9 10*3/uL (ref 1.7–7.7)
Neutrophils Relative %: 63 %
Platelets: 246 10*3/uL (ref 150–400)
RBC: 4.06 MIL/uL (ref 3.87–5.11)
RDW: 13.7 % (ref 11.5–15.5)
WBC: 7.8 10*3/uL (ref 4.0–10.5)
nRBC: 0 % (ref 0.0–0.2)

## 2023-10-09 LAB — IRON AND TIBC
Iron: 43 ug/dL (ref 28–170)
Saturation Ratios: 17 % (ref 10.4–31.8)
TIBC: 252 ug/dL (ref 250–450)
UIBC: 209 ug/dL

## 2023-10-09 LAB — FOLATE: Folate: 25.7 ng/mL (ref >5.9)

## 2023-10-09 LAB — LACTATE DEHYDROGENASE: LDH: 168 U/L (ref 98–192)

## 2023-10-09 LAB — FERRITIN: Ferritin: 943 ng/mL — ABNORMAL HIGH (ref 11–307)

## 2023-10-11 ENCOUNTER — Encounter: Payer: Self-pay | Admitting: Hematology

## 2023-10-11 LAB — METHYLMALONIC ACID, SERUM: Methylmalonic Acid, Quantitative: 181 nmol/L (ref 0–378)

## 2023-10-15 NOTE — Progress Notes (Unsigned)
[facility] [address], [place] [address]   CLINIC:  Medical Oncology/Hematology  PCP:  Mechele Claude, MD [address] [place] [address] [phone]   REASON FOR VISIT:  Follow-up for normocytic anemia  INTERVAL HISTORY:   [name] 68 y.o. female returns for routine follow-up of her normocytic anemia.  She was last seen by Rojelio Brenner PA-C on 08/02/2023.  She has not required any IV iron or blood transfusion since her last visit.  Her last Rituxan was on 09/18/23 and 10/04/23. She continues to take hydroxychloroquine.  Blood counts have been adequate. She has baseline fatigue.  She continues to deny any bright red blood per rectum, melena, epistaxis, or other signs of blood loss.  She denies any pica, dyspnea on exertion, restless legs, headaches, lightheadedness, syncope, or chest pain.  No B symptoms such as fever, chills, night sweats, unintentional weight loss.  She has 40% energy and 85% appetite.  Her weight is stable within same 5 pound range between 95 to 100 pounds; BMI remains around 17.  ASSESSMENT & PLAN:  1.  NORMOCYTIC ANEMIA -  BMBX on 07/19/2021: Mildly hypercellular marrow with trilineage hematopoiesis.  No increased ring sideroblasts or blasts.  3% polytypic plasma cells.  No large aggregates of plasma cells.  Iron stores increased.  Chromosome analysis normal. - Etiology of patient's anemia remains multifactorial.   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 Myelosuppression from rheumatology medications.  Leflunomide and sulfasalazine discontinued due to anemia, with significant improvement in blood counts.  Remains on hydroxychloroquine and started on Rituxan as of September 2024. Patient has been NEGATIVE for Hemoccult stool on multiple occasions within the past year.  No obvious rectal bleeding or melena.  EGD, colonoscopy, capsule studies did not reveal any source of  major blood loss. Bone marrow biopsy was nondiagnostic.  Hemolysis labs and nutritional deficiency work-up have been unremarkable, apart from significant functional iron deficiency (no improvement in hemoglobin after IV iron) No evidence of chronic kidney disease based on patient's creatinine or Cystatin C - She is taking vitamin B12 tablets (500 mcg EOD) at home. - Trial of monthly Feraheme x2 doses did NOT improve her hemoglobin.  Iron tablets discontinued due to elevated iron levels. - She has required intermittent blood transfusions, most recently on 12/30/2022 (Hgb 7.0) - Leflunomide was discontinued by rheumatology on 01/18/2023.  Patient's blood count improved significantly after leflunomide was stopped. - Started on rituximab (1000 mg IV every 2 weeks x 2 doses, then every 4 months) by her rheumatologist, first dose on 06/07/2023. Prior to starting Rituxan her anemia had resolved with Hgb 12.1 (04/14/2023) and Hgb 11.3 (05/24/2023).  After starting Rituxan, she had a drop in hemoglobin to 10.5 on 06/21/2023, and Hgb 8.7 as of 06/28/2023.  Blood count gradually recovered after Rituxan.    Most recent dose of Rituxan on 09/18/2023 and 10/04/2023. Blood counts have been stable after Rituxan.  Seems to be doing better on maintenance dose than she did on loading dose. Next dose of Rituxan will be due in mid-April 2025. NOTE: Discussed with Dr. Ellin Saba on 08/02/2023.  He agrees likely culprit is  anemia of chronic disease worsened by myelosuppression from Rituxan.  - Workup of recurrent anemia (September/October 2024) No nutritional deficiencies (ferritin 1267, iron saturation 46%, low TIBC 195.  Normal B12/MMA.) No evidence of Rituxan induced hemolysis (DAT negative, normal haptoglobin, normal LDH, normal bilirubin) Reticulocytes elevated to  3.7%, which could indicate compensation for recent blood loss or signs of bone marrow recovery following suppression from Rituxan - Most recent labs (10/09/2023):   Ferritin 943 iron saturation 17%, TIBC 252 Creatinine 1.09/GFR 56.   Normal B12, folate, MMA.  Normal LDH. - CBC today (10/16/2023): Hgb 10.9/MCV 93.0. - She has baseline fatigue - No bright red blood per rectum or melena - PLAN: Hemoglobin at goal (>10).  We will continue to check monthly CBC/D with BB sample (history of sudden drops in hemoglobin) - Next Rituxan is due in mid April 2025, so we will plan on checking labs and seeing her for follow-up visit at the end of April/early May 2025. - Continue vitamin B12 500 mcg every other day.  (Recheck B12/MMA in 6 months, around July 2025) - Patient educated on alarm symptoms that would prompt immediate medical attention/ED visit.   2.  Bence-Jones proteinuria -24-hour urine done by Dr. Wolfgang Phoenix showed urine immunofixation positive for Bence-Jones proteinuria, kappa type.  24-hour total protein was 77 mg/day. -Free kappa light chains were 52.9, lambda light chains 29.8 with ratio of 1.78.  Serum immunofixation was unremarkable.  SPEP did not show any evidence of M spike. -Labs on 04/03/2020 shows negative immunofixation.  Kappa light chain 61.3, lambda light chains 32.3, ratio 1.9. -Skeletal survey on 04/03/2020 was negative for lytic lesions. - Most recent MGUS/myeloma panel (10/04/2022) 24-hour urine/UPEP/UIFE/TP (08/29/2022): Total protein 261 mg/24-hr.  Urine immunofixation normal, M spike not observed.  Mildly elevated urine kappa light chains 140, with normal lambda normal ratio. Serum immunofixation unremarkable, no monoclonal protein SPEP negative for M spike. Mildly elevated serum kappa free light chain 36.4, normal lambda 21.3, mildly elevated ratio 1.71. - No new onset bone pain or neurologic changes  - PLAN: No evidence of plasma cell dyscrasia in blood or urine at this time. - Due for annual recheck of MGUS labs and urine studies due at next visit   3.  Failure to thrive with low BMI - Intermittent episodes of poor appetite and weight  loss associated with painful flareups of her RA - Weight has been fluctuating from 90 to 100 pounds over the past year, with BMI around 17 - She has been evaluated by nutritionist (phone note 01/30/2023) - EGD on 04/28/2020 with stomach and duodenal biopsy negative. - CT CAP on 05/11/2020 showed long segment colonic thickening likely diverticular disease.  Stable small juxtapleural nodule in the right upper chest benign over 1 year.  Moderate large fat-containing left inguinal hernia.  Emphysema and diverticular calcification. - Colonoscopy on 06/08/2020 shows pancolonic diverticulosis, mild.  Normal-appearing colonic mucosa otherwise.  Random biopsies were negative.  4.  Rheumatoid arthritis - Follows with [facility] Az[place], [place]) - Leflunomide discontinued in June 2024 due to anemia - Rituxan infusions started on 06/07/2023 - She continues to take hydroxychloroquine  5.  Osteoporosis - DEXA scan on 01/05/2022 with T score -2.7. - Recommended Prolia but her insurance did not cover it. - She started on Zometa on 02/23/2022. - Normal vitamin D (10/24/2022) at 38.1 - PLAN: Continue annual Zometa for total duration of 3 to 5 years.  Next Zometa due around 02/27/2024.  Continue calcium supplements. - Check calcium and Vitamin D with next appotinment  PLAN SUMMARY:  >> 24-hour urine study (UPEP/UIFE) >> Monthly CBC/BB sample >> Labs during the last week of April 2025 = CBC/D, CMP, ferritin, iron/TIBC, LDH, SPEP, immunofixation, light chains >> OFFICE visit during the first week of May 2025 (at  least 1 week after labs)  **Zometa will be due around 02/27/2024    REVIEW OF SYSTEMS:  Review of Systems  Constitutional:  Positive for fatigue. Negative for appetite change, chills, diaphoresis, fever and unexpected weight change.  HENT:   Negative for lump/mass and nosebleeds.        Difficulty chewing  Eyes:  Negative for eye problems.  Respiratory:  Negative for cough, hemoptysis and  shortness of breath.   Cardiovascular:  Negative for chest pain, leg swelling and palpitations.  Gastrointestinal:  Negative for abdominal pain, blood in stool, constipation, diarrhea, nausea and vomiting.  Genitourinary:  Negative for hematuria.   Musculoskeletal:  Positive for arthralgias and back pain.  Skin: Negative.   Neurological:  Negative for dizziness, extremity weakness (Residual deficit from CVA), headaches and light-headedness.  Hematological:  Does not bruise/bleed easily.  Psychiatric/Behavioral:  Negative for depression. The patient is not nervous/anxious.      PHYSICAL EXAM:  ECOG PERFORMANCE STATUS: 1 - Symptomatic but completely ambulatory  Today's Vitals   10/16/23 1008 10/16/23 1009  BP: (!) 159/91   Pulse: 84   Resp: 18   Temp: 97.8 F (36.6 C)   TempSrc: Tympanic   SpO2: 99%   Weight: 101 lb (45.8 kg)   Height: 5\' 4"  (1.626 m)   PainSc:  0-No pain     Physical Exam Vitals reviewed.  Constitutional:      Appearance: Normal appearance. She is underweight.  Cardiovascular:     Rate and Rhythm: Normal rate and regular rhythm.     Pulses: Normal pulses.     Heart sounds: Normal heart sounds.  Pulmonary:     Effort: Pulmonary effort is normal.     Breath sounds: Normal breath sounds.  Neurological:     Mental Status: She is alert and oriented to person, place, and time. Mental status is at baseline.  Psychiatric:        Mood and Affect: Mood normal.        Behavior: Behavior normal.    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 Allena Katz: mild diverticulsis in sigmoid colon.    COLONOSCOPY WITH PROPOFOL N/A 09/16/2019   Rourk: Diverticulosis, random colon biopsies negative for microscopic colitis.   COLONOSCOPY WITH PROPOFOL N/A 06/08/2020   Procedure: COLONOSCOPY WITH PROPOFOL;  Surgeon: Corbin Ade, MD;  [facility];  Service: Endoscopy;  Laterality: N/A;  9:15am   ESOPHAGOGASTRODUODENOSCOPY (EGD) WITH PROPOFOL N/A 04/28/2020   Procedure: ESOPHAGOGASTRODUODENOSCOPY (EGD) WITH PROPOFOL;  Surgeon: Corbin Ade, MD;  [facility];  Service: Endoscopy;  Laterality: N/A;  2:15   GIVENS CAPSULE STUDY N/A 08/12/2022   Procedure: GIVENS CAPSULE STUDY;  Surgeon: Corbin Ade, MD;  [facility];  Service: Endoscopy;  Laterality: N/A;   YAG LASER APPLICATION Left 07/27/2015   Procedure: YAG LASER APPLICATION;  Surgeon: Susa Simmonds, MD;  Location: [facility];  Service: Ophthalmology;  Laterality: Left;    SOCIAL HISTORY:  Social History   Socioeconomic History   Marital status: Widowed    Spouse name: Not on file   Number of children: 1   Years of education: Not on file   Highest education level: 12th grade  Occupational History   Occupation: DISABLED  Tobacco Use   Smoking status: Former    Current packs/day: 0.00    Average  packs/day: 1 pack/day for 15.0 years (15.0 ttl pk-yrs)    Types: Cigarettes    Start date: 09/11/2002    Quit date: 09/11/2017    Years since quitting: 6.0   Smokeless tobacco: Former  Building services engineer status: Never Used  Substance and Sexual Activity   Alcohol use: Never   Drug use: Never   Sexual activity: Not Currently  Other Topics Concern   Not on file  Social History Narrative   Disabled, widow, lives with daughter and her family. Patient still drives.    Social Drivers of Corporate investment banker Strain: Low Risk  (12/28/2022)   Overall Financial Resource Strain (CARDIA)    Difficulty of Paying Living Expenses: Not hard at all  Food Insecurity: No Food Insecurity (12/28/2022)   Hunger Vital Sign    Worried About Running Out of Food in the Last Year: Never true    Ran Out of Food in the Last Year: Never true  Transportation Needs: No Transportation Needs (12/28/2022)   PRAPARE - Administrator, Civil Service (Medical): No    Lack of Transportation (Non-Medical): No  Physical Activity: Insufficiently Active (12/28/2022)   Exercise Vital Sign    Days of Exercise per Week: 1 day    Minutes of Exercise per Session: 30 min  Stress: Stress Concern Present (12/28/2022)   [employer] of Occupational Health - Occupational Stress Questionnaire    Feeling of Stress : To some extent  Social Connections: Socially Isolated (12/28/2022)   Social Connection and Isolation Panel [NHANES]    Frequency of Communication with Friends and Family: More than three times a week    Frequency of Social Gatherings with Friends and Family: Once a week    Attends Religious Services: Never    Database administrator or Organizations: No    Attends Banker Meetings: Never    Marital Status: Widowed  Intimate Partner Violence: Not At Risk (12/13/2022)   Humiliation, Afraid, Rape, and Kick questionnaire    Fear of Current or Ex-Partner: No    Emotionally Abused: No     Physically Abused: No    Sexually Abused: No    FAMILY HISTORY:  Family History  Problem Relation Age of Onset   Stroke Mother    Heart disease Mother    Diabetes Mother    Breast cancer Mother    Other Father  stomach taken out for some reason   Migraines Daughter    Colon cancer Neg Hx     CURRENT MEDICATIONS:  Outpatient Encounter Medications as of 10/16/2023  Medication Sig   albuterol (VENTOLIN HFA) 108 (90 Base) MCG/ACT inhaler Inhale 2 puffs into the lungs every 6 (six) hours as needed for wheezing or shortness of breath.   aspirin EC 81 MG tablet Take 1 tablet (81 mg total) by mouth daily. Swallow whole.   Blood Glucose Monitoring Suppl DEVI 1 each by Does not apply route in the morning, at noon, and at bedtime. May substitute to any manufacturer covered by patient's insurance.   calcium carbonate (OSCAL) 1500 (600 Ca) MG TABS tablet Take 1,500 mg by mouth 2 (two) times daily with a meal.   ciprofloxacin (CIPRO) 500 MG tablet Take 1 tablet (500 mg total) by mouth 2 (two) times daily.   colchicine 0.6 MG tablet Take 0.6 mg by mouth daily.   Cyanocobalamin (B-12) 500 MCG TABS Take 500 mcg by mouth every other day.   desvenlafaxine (PRISTIQ) 50 MG 24 hr tablet Take 1 tablet (50 mg total) by mouth daily.   etodolac (LODINE) 300 MG capsule Take 300 mg by mouth 2 (two) times daily.   folic acid (FOLVITE) 1 MG tablet TAKE ONE TABLET ONCE DAILY   gabapentin (NEURONTIN) 300 MG capsule Take 1 capsule (300 mg total) by mouth 3 (three) times daily.   Glucose Blood (BLOOD GLUCOSE TEST STRIPS) STRP 1 each by In Vitro route 2 (two) times daily before a meal. May substitute to any manufacturer covered by patient's insurance.   hydroxychloroquine (PLAQUENIL) 200 MG tablet Take 200 mg by mouth 2 (two) times daily.   levothyroxine (SYNTHROID) 125 MCG tablet Take 1 tablet (125 mcg total) by mouth every morning.   liothyronine (CYTOMEL) 5 MCG tablet Take 2 tablets (10 mcg total) by mouth  daily.   megestrol (MEGACE) 40 MG/ML suspension Take 10 mLs (400 mg total) by mouth 2 (two) times daily.   metFORMIN (GLUCOPHAGE-XR) 500 MG 24 hr tablet Take 1 tablet (500 mg total) by mouth 2 (two) times daily with a meal.   potassium chloride SA (KLOR-CON M) 20 MEQ tablet Take 1 tablet (20 mEq total) by mouth daily.   predniSONE (DELTASONE) 5 MG tablet Take 5-10 mg by mouth daily.   rosuvastatin (CRESTOR) 40 MG tablet Take 1 tablet (40 mg total) by mouth daily.   traZODone (DESYREL) 100 MG tablet TAKE 1 TABLET AT BEDTIME AS NEEDED FOR SLEEP   No facility-administered encounter medications on file as of 10/16/2023.    ALLERGIES:  Allergies  Allergen Reactions   Adalimumab     Other reaction(s): no response Pt is unaware of this Other reaction(s): no response   Methotrexate Other (See Comments)    Other reaction(s): GI side effects   Sulfasalazine     Other Reaction(s): Anemia   Upadacitinib Other (See Comments)    Other reaction(s): GI side effects    LABORATORY DATA:  I have reviewed the labs as listed.  CBC    Component Value Date/Time   WBC 8.4 10/16/2023 0941   RBC 3.74 (L) 10/16/2023 0941   HGB 10.9 (L) 10/16/2023 0941   HGB 9.8 (L) 07/24/2023 1128   HCT 34.8 (L) 10/16/2023 0941   HCT 31.0 (L) 07/24/2023 1128   PLT 219 10/16/2023 0941   PLT 240 07/24/2023 1128   MCV 93.0 10/16/2023 0941   MCV 95 07/24/2023 1128   MCH  29.1 10/16/2023 0941   MCHC 31.3 10/16/2023 0941   RDW 13.7 10/16/2023 0941   RDW 14.1 07/24/2023 1128   LYMPHSABS 2.3 10/16/2023 0941   LYMPHSABS 1.6 07/24/2023 1128   MONOABS 0.6 10/16/2023 0941   EOSABS 0.2 10/16/2023 0941   EOSABS 0.0 07/24/2023 1128   BASOSABS 0.0 10/16/2023 0941   BASOSABS 0.0 07/24/2023 1128      Latest Ref Rng & Units 10/09/2023   11:07 AM 07/24/2023   11:28 AM 07/05/2023    2:17 PM  CMP  Glucose 70 - 99 mg/dL 295  83    BUN 8 - 23 mg/dL 20  13    Creatinine 6.21 - 1.00 mg/dL 3.08  6.57    Sodium 846 - 145 mmol/L  133  142    Potassium 3.5 - 5.1 mmol/L 4.0  3.7    Chloride 98 - 111 mmol/L 102  106    CO2 22 - 32 mmol/L 22  21    Calcium 8.9 - 10.3 mg/dL 9.2  9.1    Total Protein 6.5 - 8.1 g/dL 6.4  6.1    Total Bilirubin 0.0 - 1.2 mg/dL 0.3  0.3  0.4   Alkaline Phos 38 - 126 U/L 82  80    AST 15 - 41 U/L 16  16    ALT 0 - 44 U/L 27  15      DIAGNOSTIC IMAGING:  I have independently reviewed the relevant imaging and discussed with the patient.   WRAP UP:  All questions were answered. The patient knows to call the clinic with any problems, questions or concerns.  Medical decision making: Moderate  Time spent on visit: I spent 20 minutes counseling the patient face to face. The total time spent in the appointment was 30 minutes and more than 50% was on counseling.  Carnella Guadalajara, PA-C  10/16/23 11:05 AM

## 2023-10-16 ENCOUNTER — Inpatient Hospital Stay (HOSPITAL_BASED_OUTPATIENT_CLINIC_OR_DEPARTMENT_OTHER): Payer: 59 | Admitting: Physician Assistant

## 2023-10-16 ENCOUNTER — Inpatient Hospital Stay: Payer: 59

## 2023-10-16 VITALS — BP 159/91 | HR 84 | Temp 97.8°F | Resp 18 | Ht 64.0 in | Wt 101.0 lb

## 2023-10-16 DIAGNOSIS — M810 Age-related osteoporosis without current pathological fracture: Secondary | ICD-10-CM | POA: Diagnosis not present

## 2023-10-16 DIAGNOSIS — D472 Monoclonal gammopathy: Secondary | ICD-10-CM

## 2023-10-16 DIAGNOSIS — Z87891 Personal history of nicotine dependence: Secondary | ICD-10-CM | POA: Diagnosis not present

## 2023-10-16 DIAGNOSIS — R627 Adult failure to thrive: Secondary | ICD-10-CM | POA: Diagnosis not present

## 2023-10-16 DIAGNOSIS — R803 Bence Jones proteinuria: Secondary | ICD-10-CM

## 2023-10-16 DIAGNOSIS — D500 Iron deficiency anemia secondary to blood loss (chronic): Secondary | ICD-10-CM

## 2023-10-16 DIAGNOSIS — D508 Other iron deficiency anemias: Secondary | ICD-10-CM | POA: Diagnosis not present

## 2023-10-16 DIAGNOSIS — D649 Anemia, unspecified: Secondary | ICD-10-CM

## 2023-10-16 DIAGNOSIS — M069 Rheumatoid arthritis, unspecified: Secondary | ICD-10-CM | POA: Diagnosis not present

## 2023-10-16 DIAGNOSIS — Z8673 Personal history of transient ischemic attack (TIA), and cerebral infarction without residual deficits: Secondary | ICD-10-CM | POA: Diagnosis not present

## 2023-10-16 DIAGNOSIS — Z860100 Personal history of colon polyps, unspecified: Secondary | ICD-10-CM | POA: Diagnosis not present

## 2023-10-16 DIAGNOSIS — Z803 Family history of malignant neoplasm of breast: Secondary | ICD-10-CM | POA: Diagnosis not present

## 2023-10-16 DIAGNOSIS — D638 Anemia in other chronic diseases classified elsewhere: Secondary | ICD-10-CM

## 2023-10-16 DIAGNOSIS — Z8616 Personal history of COVID-19: Secondary | ICD-10-CM | POA: Diagnosis not present

## 2023-10-16 LAB — CBC WITH DIFFERENTIAL/PLATELET
Abs Immature Granulocytes: 0.03 10*3/uL (ref 0.00–0.07)
Basophils Absolute: 0 10*3/uL (ref 0.0–0.1)
Basophils Relative: 0 %
Eosinophils Absolute: 0.2 10*3/uL (ref 0.0–0.5)
Eosinophils Relative: 2 %
HCT: 34.8 % — ABNORMAL LOW (ref 36.0–46.0)
Hemoglobin: 10.9 g/dL — ABNORMAL LOW (ref 12.0–15.0)
Immature Granulocytes: 0 %
Lymphocytes Relative: 27 %
Lymphs Abs: 2.3 10*3/uL (ref 0.7–4.0)
MCH: 29.1 pg (ref 26.0–34.0)
MCHC: 31.3 g/dL (ref 30.0–36.0)
MCV: 93 fL (ref 80.0–100.0)
Monocytes Absolute: 0.6 10*3/uL (ref 0.1–1.0)
Monocytes Relative: 7 %
Neutro Abs: 5.4 10*3/uL (ref 1.7–7.7)
Neutrophils Relative %: 64 %
Platelets: 219 10*3/uL (ref 150–400)
RBC: 3.74 MIL/uL — ABNORMAL LOW (ref 3.87–5.11)
RDW: 13.7 % (ref 11.5–15.5)
WBC: 8.4 10*3/uL (ref 4.0–10.5)
nRBC: 0 % (ref 0.0–0.2)

## 2023-10-16 LAB — SAMPLE TO BLOOD BANK

## 2023-10-16 NOTE — Patient Instructions (Signed)
[facility] at [facility] **VISIT SUMMARY & IMPORTANT INSTRUCTIONS **   You were seen today by Rojelio Brenner PA-C for your anemia.    ANEMIA: Your blood counts had gotten better after your leflunomide was stopped.  We believe that Rituxan has also been contributing to your anemia over the past 6 months, although you have some mild chronic anemia related to chronic inflammation. We will continue to check blood counts once a month. We will check additional labs and see you for follow-up visit after your next Rituxan dose in April 2025.  MGUS ("monoclonal gammopathy of undetermined significance") As we discussed, you have a slightly increased amount of abnormal protein in your urine, which can be a sign of a precancerous condition. We will check these labs and discuss this in more detail at your follow-up visit in April 2025. Please COMPLETE 24-HOUR URINE STUDY before your next visit.  It has been provided to you.  Instructions summarized below: FIRST MORNING: Discard first urine of the morning into the toilet. Collect the rest of your urine in the orange jug for the next 24 hours. SECOND MORNING: Collect your first urine of the morning in the jug.  This ends your 24-hour urine collection. **Store the urine jug in the refrigerator but is not being used. Return urine jug to fourth floor front desk as soon as it is completed.   ** Please continue to work on eating plenty of calories, even when you are in pain.  It is important that you do not lose any more weight.  ** Thank you for trusting me with your healthcare!  I strive to provide all of my patients with quality care at each visit.  If you receive a survey for this visit, I would be so grateful to you for taking the time to provide feedback.  Thank you in advance!  ~ Marilyne Haseley                   Dr. Doreatha Massed   &   Rojelio Brenner, PA-C   - - - - - - - - - - - - - - - - - -    Thank you for choosing  [facility] at [facility] to provide your oncology and hematology care.  To afford each patient quality time with our provider, please arrive at least 15 minutes before your scheduled appointment time.   If you have a lab appointment with the Cancer Center please come in thru the Main Entrance and check in at the main information desk.  You need to re-schedule your appointment should you arrive 10 or more minutes late.  We strive to give you quality time with our providers, and arriving late affects you and other patients whose appointments are after yours.  Also, if you no show three or more times for appointments you may be dismissed from the clinic at the providers discretion.     Again, thank you for choosing [facility].  Our hope is that these requests will decrease the amount of time that you wait before being seen by our physicians.       _____________________________________________________________  Should you have questions after your visit to [facility], please contact our office at [phone] and follow the prompts.  Our office hours are 8:00 a.m. and 4:30 p.m. Monday - Friday.  Please note that voicemails left after 4:00 p.m. may not be returned until  the following business day.  We are closed weekends and major holidays.  You do have access to a nurse 24-7, just call the main number to the clinic [phone] and do not press any options, hold on the line and a nurse will answer the phone.    For prescription refill requests, have your pharmacy contact our office and allow 72 hours.

## 2023-10-24 ENCOUNTER — Encounter: Payer: Self-pay | Admitting: Hematology

## 2023-10-30 DIAGNOSIS — Z961 Presence of intraocular lens: Secondary | ICD-10-CM | POA: Diagnosis not present

## 2023-10-30 DIAGNOSIS — H401330 Pigmentary glaucoma, bilateral, stage unspecified: Secondary | ICD-10-CM | POA: Diagnosis not present

## 2023-10-30 DIAGNOSIS — H40051 Ocular hypertension, right eye: Secondary | ICD-10-CM | POA: Diagnosis not present

## 2023-11-06 ENCOUNTER — Other Ambulatory Visit: Payer: Self-pay | Admitting: Family Medicine

## 2023-11-06 DIAGNOSIS — G629 Polyneuropathy, unspecified: Secondary | ICD-10-CM

## 2023-11-06 DIAGNOSIS — E039 Hypothyroidism, unspecified: Secondary | ICD-10-CM

## 2023-11-08 ENCOUNTER — Encounter: Payer: Self-pay | Admitting: Hematology

## 2023-11-09 ENCOUNTER — Encounter: Payer: Self-pay | Admitting: Hematology

## 2023-11-14 ENCOUNTER — Other Ambulatory Visit: Payer: Self-pay | Admitting: Family Medicine

## 2023-11-15 ENCOUNTER — Ambulatory Visit: Payer: 59 | Admitting: Family Medicine

## 2023-11-16 ENCOUNTER — Inpatient Hospital Stay: Payer: 59

## 2023-11-20 DIAGNOSIS — M1991 Primary osteoarthritis, unspecified site: Secondary | ICD-10-CM | POA: Diagnosis not present

## 2023-11-20 DIAGNOSIS — M25561 Pain in right knee: Secondary | ICD-10-CM | POA: Diagnosis not present

## 2023-11-20 DIAGNOSIS — L4059 Other psoriatic arthropathy: Secondary | ICD-10-CM | POA: Diagnosis not present

## 2023-11-20 DIAGNOSIS — D649 Anemia, unspecified: Secondary | ICD-10-CM | POA: Diagnosis not present

## 2023-11-20 DIAGNOSIS — Z79899 Other long term (current) drug therapy: Secondary | ICD-10-CM | POA: Diagnosis not present

## 2023-11-20 DIAGNOSIS — M0579 Rheumatoid arthritis with rheumatoid factor of multiple sites without organ or systems involvement: Secondary | ICD-10-CM | POA: Diagnosis not present

## 2023-11-20 DIAGNOSIS — M25562 Pain in left knee: Secondary | ICD-10-CM | POA: Diagnosis not present

## 2023-11-20 DIAGNOSIS — M79676 Pain in unspecified toe(s): Secondary | ICD-10-CM | POA: Diagnosis not present

## 2023-11-20 DIAGNOSIS — Z681 Body mass index (BMI) 19 or less, adult: Secondary | ICD-10-CM | POA: Diagnosis not present

## 2023-11-20 DIAGNOSIS — L409 Psoriasis, unspecified: Secondary | ICD-10-CM | POA: Diagnosis not present

## 2023-11-21 LAB — LAB REPORT - SCANNED: EGFR: 57

## 2023-11-22 DIAGNOSIS — N189 Chronic kidney disease, unspecified: Secondary | ICD-10-CM | POA: Diagnosis not present

## 2023-11-22 DIAGNOSIS — D631 Anemia in chronic kidney disease: Secondary | ICD-10-CM | POA: Diagnosis not present

## 2023-11-22 DIAGNOSIS — R809 Proteinuria, unspecified: Secondary | ICD-10-CM | POA: Diagnosis not present

## 2023-11-27 ENCOUNTER — Encounter: Payer: Self-pay | Admitting: Family Medicine

## 2023-11-27 ENCOUNTER — Ambulatory Visit: Payer: 59 | Admitting: Family Medicine

## 2023-11-27 VITALS — BP 116/86 | HR 83 | Temp 97.9°F | Ht 64.0 in | Wt 108.0 lb

## 2023-11-27 DIAGNOSIS — E1159 Type 2 diabetes mellitus with other circulatory complications: Secondary | ICD-10-CM

## 2023-11-27 DIAGNOSIS — E119 Type 2 diabetes mellitus without complications: Secondary | ICD-10-CM | POA: Diagnosis not present

## 2023-11-27 DIAGNOSIS — F339 Major depressive disorder, recurrent, unspecified: Secondary | ICD-10-CM

## 2023-11-27 DIAGNOSIS — Z7984 Long term (current) use of oral hypoglycemic drugs: Secondary | ICD-10-CM | POA: Diagnosis not present

## 2023-11-27 DIAGNOSIS — G629 Polyneuropathy, unspecified: Secondary | ICD-10-CM | POA: Diagnosis not present

## 2023-11-27 DIAGNOSIS — I10 Essential (primary) hypertension: Secondary | ICD-10-CM | POA: Diagnosis not present

## 2023-11-27 DIAGNOSIS — M0579 Rheumatoid arthritis with rheumatoid factor of multiple sites without organ or systems involvement: Secondary | ICD-10-CM

## 2023-11-27 DIAGNOSIS — I152 Hypertension secondary to endocrine disorders: Secondary | ICD-10-CM

## 2023-11-27 DIAGNOSIS — E039 Hypothyroidism, unspecified: Secondary | ICD-10-CM | POA: Diagnosis not present

## 2023-11-27 LAB — BAYER DCA HB A1C WAIVED: HB A1C (BAYER DCA - WAIVED): 8.9 % — ABNORMAL HIGH (ref 4.8–5.6)

## 2023-11-27 MED ORDER — METFORMIN HCL ER 500 MG PO TB24: 500.0000 mg | ORAL_TABLET | Freq: Two times a day (BID) | ORAL | 1 refills | Status: DC

## 2023-11-27 MED ORDER — LEVOTHYROXINE SODIUM 125 MCG PO TABS: 125.0000 ug | ORAL_TABLET | Freq: Every morning | ORAL | 0 refills | Status: DC

## 2023-11-27 MED ORDER — LIOTHYRONINE SODIUM 5 MCG PO TABS: 10.0000 ug | ORAL_TABLET | Freq: Every day | ORAL | 0 refills | Status: DC

## 2023-11-27 MED ORDER — DESVENLAFAXINE SUCCINATE ER 50 MG PO TB24: 50.0000 mg | ORAL_TABLET | Freq: Every day | ORAL | 3 refills | Status: AC

## 2023-11-27 MED ORDER — AMOXICILLIN-POT CLAVULANATE 875-125 MG PO TABS: 1.0000 | ORAL_TABLET | Freq: Two times a day (BID) | ORAL | 0 refills | Status: DC

## 2023-11-27 MED ORDER — GABAPENTIN 300 MG PO CAPS
300.0000 mg | ORAL_CAPSULE | Freq: Three times a day (TID) | ORAL | 0 refills | Status: DC
Start: 2023-11-27 — End: 2024-02-28

## 2023-11-27 MED ORDER — TRAZODONE HCL 100 MG PO TABS: 100.0000 mg | ORAL_TABLET | Freq: Every evening | ORAL | 1 refills | Status: DC | PRN

## 2023-11-27 MED ORDER — METFORMIN HCL ER 750 MG PO TB24: 750.0000 mg | ORAL_TABLET | Freq: Two times a day (BID) | ORAL | 3 refills | Status: AC

## 2023-11-27 NOTE — Progress Notes (Signed)
 Subjective:  Patient ID: Erica Beasley, female    DOB: [birth year]  Age: 68 y.o. MRN: [id number]  CC: Medical Management of Chronic Issues and joint pain (Joint pain all over from arthritis. )   HPI Erica Beasley presents for haing a hard time getting around. Stopped prednisone. Has Rheumatoid arthritis. Takes plaquenil and lodine. Has an IV she gets every 4 months. It sarts with an "R".  Right big toe hurts.    follow-up on  thyroid. The patient has a history of hypothyroidism for many years. It has been stable recently. Pt. denies any change in  voice, loss of hair, heat or cold intolerance. Energy level has been adequate to good. Patient denies constipation and diarrhea. No myxedema. Medication is as noted below. Verified that pt is taking it daily on an empty stomach. Well tolerated.  presents forFollow-up of diabetes. Patient checks blood sugar at home.  Running high. Patient denies symptoms such as polyuria, polydipsia, excessive hunger, nausea No significant hypoglycemic spells noted. Medications reviewed. Pt reports taking them regularly without complication/adverse reaction being reported today.    presents for  follow-up of hypertension. Patient has no history of headache chest pain or shortness of breath or recent cough. Patient also denies symptoms of TIA such as focal numbness or weakness. Patient denies side effects from medication. States taking it regularly.   in for follow-up of elevated cholesterol. Doing well without complaints on current medication. Denies side effects of statin including myalgia and arthralgia and nausea. Currently no chest pain, shortness of breath or other cardiovascular related symptoms noted.  Lab Results  Component Value Date   HGBA1C 8.9 (H) 11/27/2023   HGBA1C 7.1 (H) 07/24/2023   HGBA1C 5.9 (H) 02/28/2023         11/27/2023    2:25 PM 08/29/2023    3:31 PM 07/24/2023   10:16 AM  Depression screen PHQ 2/9  Decreased Interest 0 0 0  Down,  Depressed, Hopeless 0 0 0  PHQ - 2 Score 0 0 0  Altered sleeping 0  0  Tired, decreased energy 0  3  Change in appetite 0  0  Feeling bad or failure about yourself  0  0  Trouble concentrating 0  0  Moving slowly or fidgety/restless 0  0  Suicidal thoughts 0  0  PHQ-9 Score 0  3  Difficult doing work/chores Not difficult at all  Not difficult at all    History Mirinda has a past medical history of Aortic atherosclerosis (HCC) (11/13/2020), CKD (chronic kidney disease) stage 2, GFR 60-89 ml/min, COPD (chronic obstructive pulmonary disease) (HCC), COVID-19 (06/2019), Depression, Essential hypertension, GERD (gastroesophageal reflux disease), H/O Clostridium difficile infection (05/03/2019), History of diabetes mellitus, colonic polyps (05/03/2019), Hyperlipidemia, Hypothyroidism, Neuropathy, Rheumatoid arthritis (HCC), 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   She has a past surgical history that includes Yag laser application (Left, 07/27/2015); Colonoscopy (09/2010); Colonoscopy with propofol (N/A, 09/16/2019); biopsy (09/16/2019); Esophagogastroduodenoscopy (egd) with propofol (N/A, 04/28/2020); biopsy (04/28/2020); Cataract extraction (Bilateral); Colonoscopy with propofol (N/A, 06/08/2020); biopsy (06/08/2020); and Givens capsule study (N/A, 08/12/2022).   Her family history includes Breast cancer in her mother; Diabetes in her mother; Heart disease in her mother; Migraines in her daughter; Other in her father; Stroke in her mother.She reports that she quit smoking about 6 years ago. Her smoking use included cigarettes. She started smoking about 21 years ago. She has a 15 pack-year smoking history. She  has quit using smokeless tobacco. She reports that she does not drink alcohol and does not use drugs.    ROS Review of Systems  Constitutional: Negative.   HENT: Negative.    Eyes:  Negative for visual disturbance.   Respiratory:  Negative for shortness of breath.   Cardiovascular:  Negative for chest pain.  Gastrointestinal:  Negative for abdominal pain.  Musculoskeletal:  Positive for arthralgias, gait problem, joint swelling and myalgias.    Objective:  BP 116/86   Pulse 83   Temp 97.9 F (36.6 C)   Ht 5\' 4"  (1.626 m)   Wt 108 lb (49 kg)   SpO2 98%   BMI 18.54 kg/m   BP Readings from Last 3 Encounters:  11/27/23 116/86  10/16/23 (!) 159/91  08/29/23 (!) 148/82    Wt Readings from Last 3 Encounters:  11/27/23 108 lb (49 kg)  10/16/23 101 lb (45.8 kg)  08/29/23 97 lb 6.4 oz (44.2 kg)     Physical Exam Constitutional:      General: She is not in acute distress.    Appearance: She is well-developed.  HENT:     Head: Normocephalic and atraumatic.  Eyes:     Conjunctiva/sclera: Conjunctivae normal.     Pupils: Pupils are equal, round, and reactive to light.  Neck:     Thyroid: No thyromegaly.  Cardiovascular:     Rate and Rhythm: Normal rate and regular rhythm.     Heart sounds: Normal heart sounds. No murmur heard. Pulmonary:     Effort: Pulmonary effort is normal. No respiratory distress.     Breath sounds: Normal breath sounds. No wheezing or rales.  Abdominal:     General: Bowel sounds are normal. There is no distension.     Palpations: Abdomen is soft.     Tenderness: There is no abdominal tenderness.  Musculoskeletal:        General: Normal range of motion.     Cervical back: Normal range of motion and neck supple.  Lymphadenopathy:     Cervical: No cervical adenopathy.  Skin:    General: Skin is warm and dry.     Findings: Erythema (right lg. toe has margin of erythema around the nail, ingrown) present.  Neurological:     Mental Status: She is alert and oriented to person, place, and time.  Psychiatric:        Behavior: Behavior normal.        Thought Content: Thought content normal.        Judgment: Judgment normal.       Assessment & Plan:   Krissi  was seen today for medical management of chronic issues and joint pain.  Diagnoses and all orders for this visit:  Hypertension associated with diabetes (HCC) -     Bayer DCA Hb A1c Waived  Hypothyroidism, unspecified type -     TSH + free T4  Rheumatoid arthritis involving multiple sites with positive rheumatoid factor (HCC) -     CBC with Differential/Platelet -     CMP14+EGFR -     Lipid panel -     TSH + free T4  Essential hypertension -     CBC with Differential/Platelet -     CMP14+EGFR -     Lipid panel  Acquired hypothyroidism -     levothyroxine (SYNTHROID) 125 MCG tablet; Take 1 tablet (125 mcg total) by mouth every morning.  Neuropathy -     gabapentin (NEURONTIN) 300 MG capsule; Take 1  capsule (300 mg total) by mouth 3 (three) times daily.  Depression, recurrent (HCC) -     traZODone (DESYREL) 100 MG tablet; Take 1 tablet (100 mg total) by mouth at bedtime as needed. for sleep  Other orders -     desvenlafaxine (PRISTIQ) 50 MG 24 hr tablet; Take 1 tablet (50 mg total) by mouth daily. -     liothyronine (CYTOMEL) 5 MCG tablet; Take 2 tablets (10 mcg total) by mouth daily. -     Discontinue: metFORMIN (GLUCOPHAGE-XR) 500 MG 24 hr tablet; Take 1 tablet (500 mg total) by mouth 2 (two) times daily with a meal. -     amoxicillin-clavulanate (AUGMENTIN) 875-125 MG tablet; Take 1 tablet by mouth 2 (two) times daily. Take all of this medication -     metFORMIN (GLUCOPHAGE-XR) 750 MG 24 hr tablet; Take 1 tablet (750 mg total) by mouth 2 (two) times daily with a meal.   Pt. Off diet eating sweets, sodas, bread, etc. Discussed getting on low carb iet again.    I have discontinued Tyreisha A. Yeager's metFORMIN. I have also changed her levothyroxine, liothyronine, gabapentin, traZODone, and metFORMIN. Additionally, I am having her start on amoxicillin-clavulanate. Lastly, I am having her maintain her hydroxychloroquine, albuterol, calcium carbonate, aspirin EC, folic acid, Blood  Glucose Monitoring Suppl, megestrol, B-12, potassium chloride SA, predniSONE, colchicine, etodolac, rosuvastatin, ciprofloxacin, and desvenlafaxine.  Allergies as of 11/27/2023       Reactions   Adalimumab    Other reaction(s): no response Pt is unaware of this Other reaction(s): no response   Methotrexate Other (See Comments)   Other reaction(s): GI side effects   Sulfasalazine    Other Reaction(s): Anemia   Upadacitinib Other (See Comments)   Other reaction(s): GI side effects        Medication List        Accurate as of November 27, 2023  4:50 PM. If you have any questions, ask your nurse or doctor.          albuterol 108 (90 Base) MCG/ACT inhaler Commonly known as: VENTOLIN HFA Inhale 2 puffs into the lungs every 6 (six) hours as needed for wheezing or shortness of breath.   amoxicillin-clavulanate 875-125 MG tablet Commonly known as: AUGMENTIN Take 1 tablet by mouth 2 (two) times daily. Take all of this medication Started by: Broadus John Alontae Chaloux   aspirin EC 81 MG tablet Take 1 tablet (81 mg total) by mouth daily. Swallow whole.   B-12 500 MCG Tabs Take 500 mcg by mouth every other day.   Blood Glucose Monitoring Suppl Devi 1 each by Does not apply route in the morning, at noon, and at bedtime. May substitute to any manufacturer covered by patient's insurance.   calcium carbonate 1500 (600 Ca) MG Tabs tablet Commonly known as: OSCAL Take 1,500 mg by mouth 2 (two) times daily with a meal.   ciprofloxacin 500 MG tablet Commonly known as: Cipro Take 1 tablet (500 mg total) by mouth 2 (two) times daily.   colchicine 0.6 MG tablet Take 0.6 mg by mouth daily.   desvenlafaxine 50 MG 24 hr tablet Commonly known as: Pristiq Take 1 tablet (50 mg total) by mouth daily.   etodolac 300 MG capsule Commonly known as: LODINE Take 300 mg by mouth 2 (two) times daily.   folic acid 1 MG tablet Commonly known as: FOLVITE TAKE ONE TABLET ONCE DAILY   gabapentin 300 MG  capsule Commonly known as: NEURONTIN Take 1 capsule (300 mg  total) by mouth 3 (three) times daily. What changed: See the new instructions. Changed by: Maleeah Crossman   hydroxychloroquine 200 MG tablet Commonly known as: PLAQUENIL Take 200 mg by mouth 2 (two) times daily.   levothyroxine 125 MCG tablet Commonly known as: SYNTHROID Take 1 tablet (125 mcg total) by mouth every morning.   liothyronine 5 MCG tablet Commonly known as: CYTOMEL Take 2 tablets (10 mcg total) by mouth daily.   megestrol 40 MG/ML suspension Commonly known as: MEGACE Take 10 mLs (400 mg total) by mouth 2 (two) times daily.   metFORMIN 750 MG 24 hr tablet Commonly known as: GLUCOPHAGE-XR Take 1 tablet (750 mg total) by mouth 2 (two) times daily with a meal. What changed:  medication strength how much to take Changed by: Tamber Burtch   potassium chloride SA 20 MEQ tablet Commonly known as: KLOR-CON M Take 1 tablet (20 mEq total) by mouth daily.   predniSONE 5 MG tablet Commonly known as: DELTASONE Take 5-10 mg by mouth daily.   rosuvastatin 40 MG tablet Commonly known as: CRESTOR Take 1 tablet (40 mg total) by mouth daily.   traZODone 100 MG tablet Commonly known as: DESYREL Take 1 tablet (100 mg total) by mouth at bedtime as needed. for sleep         Follow-up: Return in about 3 months (around 02/27/2024), or if symptoms worsen or fail to improve, for diabetes.  Mechele Claude, M.D.

## 2023-11-28 DIAGNOSIS — L03031 Cellulitis of right toe: Secondary | ICD-10-CM | POA: Diagnosis not present

## 2023-11-28 DIAGNOSIS — M79674 Pain in right toe(s): Secondary | ICD-10-CM | POA: Diagnosis not present

## 2023-11-28 LAB — CBC WITH DIFFERENTIAL/PLATELET
Basophils Absolute: 0 10*3/uL (ref 0.0–0.2)
Basos: 0 %
EOS (ABSOLUTE): 0 10*3/uL (ref 0.0–0.4)
Eos: 0 %
Hematocrit: 32.9 % — ABNORMAL LOW (ref 34.0–46.6)
Hemoglobin: 11 g/dL — ABNORMAL LOW (ref 11.1–15.9)
Immature Grans (Abs): 0 10*3/uL (ref 0.0–0.1)
Immature Granulocytes: 0 %
Lymphocytes Absolute: 0.8 10*3/uL (ref 0.7–3.1)
Lymphs: 12 %
MCH: 30.1 pg (ref 26.6–33.0)
MCHC: 33.4 g/dL (ref 31.5–35.7)
MCV: 90 fL (ref 79–97)
Monocytes Absolute: 0.3 10*3/uL (ref 0.1–0.9)
Monocytes: 5 %
Neutrophils Absolute: 5.7 10*3/uL (ref 1.4–7.0)
Neutrophils: 83 %
Platelets: 240 10*3/uL (ref 150–450)
RBC: 3.65 x10E6/uL — ABNORMAL LOW (ref 3.77–5.28)
RDW: 13.9 % (ref 11.7–15.4)
WBC: 6.9 10*3/uL (ref 3.4–10.8)

## 2023-11-28 LAB — CMP14+EGFR
ALT: 25 IU/L (ref 0–32)
AST: 15 IU/L (ref 0–40)
Albumin: 3.6 g/dL — ABNORMAL LOW (ref 3.9–4.9)
Alkaline Phosphatase: 98 IU/L (ref 44–121)
BUN/Creatinine Ratio: 16 (ref 12–28)
BUN: 18 mg/dL (ref 8–27)
Bilirubin Total: 0.4 mg/dL (ref 0.0–1.2)
CO2: 22 mmol/L (ref 20–29)
Calcium: 9.1 mg/dL (ref 8.7–10.3)
Chloride: 99 mmol/L (ref 96–106)
Creatinine, Ser: 1.16 mg/dL — ABNORMAL HIGH (ref 0.57–1.00)
Globulin, Total: 2.3 g/dL (ref 1.5–4.5)
Glucose: 371 mg/dL — ABNORMAL HIGH (ref 70–99)
Potassium: 4.8 mmol/L (ref 3.5–5.2)
Sodium: 133 mmol/L — ABNORMAL LOW (ref 134–144)
Total Protein: 5.9 g/dL — ABNORMAL LOW (ref 6.0–8.5)
eGFR: 52 mL/min/{1.73_m2} — ABNORMAL LOW (ref >59)

## 2023-11-28 LAB — LIPID PANEL
Chol/HDL Ratio: 1.9 ratio (ref 0.0–4.4)
Cholesterol, Total: 125 mg/dL (ref 100–199)
HDL: 65 mg/dL (ref >39)
LDL Chol Calc (NIH): 43 mg/dL (ref 0–99)
Triglycerides: 88 mg/dL (ref 0–149)
VLDL Cholesterol Cal: 17 mg/dL (ref 5–40)

## 2023-11-28 LAB — TSH+FREE T4
Free T4: 1.55 ng/dL (ref 0.82–1.77)
TSH: 0.037 u[IU]/mL — ABNORMAL LOW (ref 0.450–4.500)

## 2023-11-29 ENCOUNTER — Other Ambulatory Visit (HOSPITAL_COMMUNITY): Payer: Self-pay | Admitting: Nephrology

## 2023-11-29 ENCOUNTER — Encounter: Payer: Self-pay | Admitting: Family Medicine

## 2023-11-29 DIAGNOSIS — R809 Proteinuria, unspecified: Secondary | ICD-10-CM | POA: Diagnosis not present

## 2023-11-29 DIAGNOSIS — N189 Chronic kidney disease, unspecified: Secondary | ICD-10-CM

## 2023-11-29 DIAGNOSIS — I129 Hypertensive chronic kidney disease with stage 1 through stage 4 chronic kidney disease, or unspecified chronic kidney disease: Secondary | ICD-10-CM | POA: Diagnosis not present

## 2023-11-29 DIAGNOSIS — E1122 Type 2 diabetes mellitus with diabetic chronic kidney disease: Secondary | ICD-10-CM | POA: Diagnosis not present

## 2023-11-29 DIAGNOSIS — N1831 Chronic kidney disease, stage 3a: Secondary | ICD-10-CM | POA: Diagnosis not present

## 2023-12-06 ENCOUNTER — Ambulatory Visit (HOSPITAL_COMMUNITY): Admission: RE | Admit: 2023-12-06 | Source: Ambulatory Visit

## 2023-12-11 ENCOUNTER — Other Ambulatory Visit: Payer: Self-pay | Admitting: Hematology

## 2023-12-11 DIAGNOSIS — E876 Hypokalemia: Secondary | ICD-10-CM

## 2023-12-12 ENCOUNTER — Encounter: Payer: Self-pay | Admitting: Nurse Practitioner

## 2023-12-12 ENCOUNTER — Ambulatory Visit (INDEPENDENT_AMBULATORY_CARE_PROVIDER_SITE_OTHER): Admitting: Nurse Practitioner

## 2023-12-12 VITALS — BP 136/76 | HR 69 | Temp 97.7°F | Ht 64.0 in | Wt 107.5 lb

## 2023-12-12 DIAGNOSIS — M25562 Pain in left knee: Secondary | ICD-10-CM | POA: Insufficient documentation

## 2023-12-12 NOTE — Progress Notes (Unsigned)
 Acute Office Visit  Subjective:     Patient ID: Erica Beasley, female    DOB: May 28, 1956, 68 y.o.   MRN: [id number]  Chief Complaint  Patient presents with   Leg Pain    Left leg pain, difficult to move it for a week     HPI Erica Beasley Knee Pain: Patient presents with knee pain involving the  left knee. Onset of the symptoms was a week ago. Inciting event: none known. Current symptoms include stiffness. Pain is aggravated by standing and walking.  Patient has had prior knee problems. Has arthristis and get infusion, nexy infusiin is schedule in April. 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 03/26/2020   Hypothyroidism 11/13/2020   B12 deficiency 11/13/2020   COPD without exacerbation (HCC) 11/13/2020   Vitamin D deficiency 11/13/2020   Neuropathy 11/13/2020   Rheumatoid arthritis involving multiple sites (HCC) 11/13/2020   Mixed hyperlipidemia 11/13/2020   Aortic atherosclerosis (HCC) 11/13/2020   Osteoporosis 01/17/2022   Symptomatic anemia 05/12/2022   IDA (iron deficiency anemia) 06/07/2022   Chronic bilateral low back pain without sciatica 06/29/2022   Chronic right SI joint pain 06/29/2022   Gait difficulty 06/29/2022   Synovitis of left knee 08/12/2022   Hyperglycemia 10/24/2022   Acute cystitis without hematuria 07/04/2023   Dysuria 07/04/2023   Acute pain of left knee 12/12/2023   Resolved Ambulatory Problems    Diagnosis Date Noted   Hx of colonic polyps 05/03/2019   H/O Clostridium difficile infection 05/03/2019   Weight loss 04/03/2020   N&V (nausea and vomiting)  04/08/2020   Annual physical exam 05/07/2020   Abnormal CT of the abdomen 06/02/2020   Dehydration 08/13/2020   Hypokalemia 08/13/2020   Essential hypertension 09/22/2020   Prediabetes 11/13/2020   Stroke (HCC) 07/08/2022   Stroke (cerebrum) (HCC) 07/09/2022   Past Medical History:  Diagnosis Date   CKD (chronic kidney disease) stage 2, GFR 60-89 ml/min    COPD (chronic obstructive pulmonary disease) (HCC)    COVID-19 06/2019   Depression    History of diabetes mellitus    Hyperlipidemia    Rheumatoid arthritis (HCC)    Vitamin B12 deficiency      ROS Negative unless indicated in HPI    Objective:    BP 136/76   Pulse 69   Temp 97.7 F (36.5 C) (Temporal)   Ht 5\' 4"  (1.626 m)   Wt 107 lb 8 oz (48.8 kg)   SpO2 98%   BMI 18.45 kg/m  BP Readings from Last 3 Encounters:  12/12/23 136/76  11/27/23 116/86  10/16/23 (!) 159/91   Wt Readings from Last 3 Encounters:  12/12/23 107 lb 8 oz (48.8 kg)  11/27/23 108 lb (49 kg)  10/16/23 101 lb (45.8 kg)      Physical Exam  No results found for any visits on 12/12/23.      Assessment & Plan:  Acute pain of left knee  Continue healthy lifestyle choices, including diet (rich in fruits, vegetables, and lean proteins, and low in salt and simple carbohydrates) and exercise (at least 30 minutes of moderate physical activity  daily).     The above assessment and management plan was discussed with the patient. The patient verbalized understanding of and has agreed to the management plan. Patient is aware to call the clinic if they develop any new symptoms or if symptoms persist or worsen. Patient is aware when to return to the clinic for a follow-up visit. Patient educated on when it is appropriate to go to the emergency department.   Return if symptoms worsen or fail to improve.  Arrie Aran Santa Lighter, [facility] [address] [place], [place] [address] [phone]  Note: This document  was prepared by [employer] voice dictation technology and any errors that results from this process are unintentional.

## 2023-12-13 ENCOUNTER — Inpatient Hospital Stay: Attending: Hematology

## 2023-12-13 ENCOUNTER — Ambulatory Visit (HOSPITAL_COMMUNITY)
Admission: RE | Admit: 2023-12-13 | Discharge: 2023-12-13 | Disposition: A | Discharge status: Home / Self Care | Source: Ambulatory Visit | Attending: Nephrology | Admitting: Nephrology

## 2023-12-13 ENCOUNTER — Other Ambulatory Visit: Payer: Self-pay

## 2023-12-13 DIAGNOSIS — D508 Other iron deficiency anemias: Secondary | ICD-10-CM | POA: Diagnosis not present

## 2023-12-13 DIAGNOSIS — D472 Monoclonal gammopathy: Secondary | ICD-10-CM

## 2023-12-13 DIAGNOSIS — M069 Rheumatoid arthritis, unspecified: Secondary | ICD-10-CM | POA: Diagnosis not present

## 2023-12-13 DIAGNOSIS — D649 Anemia, unspecified: Secondary | ICD-10-CM

## 2023-12-13 DIAGNOSIS — N189 Chronic kidney disease, unspecified: Secondary | ICD-10-CM | POA: Diagnosis not present

## 2023-12-13 DIAGNOSIS — D500 Iron deficiency anemia secondary to blood loss (chronic): Secondary | ICD-10-CM

## 2023-12-13 DIAGNOSIS — N182 Chronic kidney disease, stage 2 (mild): Secondary | ICD-10-CM | POA: Diagnosis not present

## 2023-12-13 DIAGNOSIS — R803 Bence Jones proteinuria: Secondary | ICD-10-CM

## 2023-12-13 LAB — CBC WITH DIFFERENTIAL/PLATELET
Abs Immature Granulocytes: 0.06 10*3/uL (ref 0.00–0.07)
Basophils Absolute: 0 10*3/uL (ref 0.0–0.1)
Basophils Relative: 0 %
Eosinophils Absolute: 0 10*3/uL (ref 0.0–0.5)
Eosinophils Relative: 0 %
HCT: 26.3 % — ABNORMAL LOW (ref 36.0–46.0)
Hemoglobin: 8.2 g/dL — ABNORMAL LOW (ref 12.0–15.0)
Immature Granulocytes: 1 %
Lymphocytes Relative: 16 %
Lymphs Abs: 1 10*3/uL (ref 0.7–4.0)
MCH: 29.7 pg (ref 26.0–34.0)
MCHC: 31.2 g/dL (ref 30.0–36.0)
MCV: 95.3 fL (ref 80.0–100.0)
Monocytes Absolute: 0.3 10*3/uL (ref 0.1–1.0)
Monocytes Relative: 4 %
Neutro Abs: 5.1 10*3/uL (ref 1.7–7.7)
Neutrophils Relative %: 79 %
Platelets: 262 10*3/uL (ref 150–400)
RBC: 2.76 MIL/uL — ABNORMAL LOW (ref 3.87–5.11)
RDW: 14.7 % (ref 11.5–15.5)
WBC: 6.5 10*3/uL (ref 4.0–10.5)
nRBC: 0 % (ref 0.0–0.2)

## 2023-12-13 LAB — SAMPLE TO BLOOD BANK

## 2023-12-14 ENCOUNTER — Inpatient Hospital Stay: Payer: 59

## 2023-12-19 ENCOUNTER — Encounter: Payer: Self-pay | Admitting: Physician Assistant

## 2023-12-19 DIAGNOSIS — D500 Iron deficiency anemia secondary to blood loss (chronic): Secondary | ICD-10-CM

## 2023-12-19 NOTE — Telephone Encounter (Signed)
 She has had a significant drop in her Hgb.  What would you like to do with her?

## 2023-12-25 ENCOUNTER — Inpatient Hospital Stay

## 2023-12-25 DIAGNOSIS — R803 Bence Jones proteinuria: Secondary | ICD-10-CM

## 2023-12-25 DIAGNOSIS — D508 Other iron deficiency anemias: Secondary | ICD-10-CM | POA: Diagnosis not present

## 2023-12-25 DIAGNOSIS — D500 Iron deficiency anemia secondary to blood loss (chronic): Secondary | ICD-10-CM

## 2023-12-25 DIAGNOSIS — M069 Rheumatoid arthritis, unspecified: Secondary | ICD-10-CM | POA: Diagnosis not present

## 2023-12-25 LAB — IRON AND TIBC
Iron: 35 ug/dL (ref 28–170)
Saturation Ratios: 14 % (ref 10.4–31.8)
TIBC: 256 ug/dL (ref 250–450)
UIBC: 221 ug/dL

## 2023-12-25 LAB — CBC WITH DIFFERENTIAL/PLATELET
Abs Immature Granulocytes: 0.03 10*3/uL (ref 0.00–0.07)
Basophils Absolute: 0 10*3/uL (ref 0.0–0.1)
Basophils Relative: 0 %
Eosinophils Absolute: 0 10*3/uL (ref 0.0–0.5)
Eosinophils Relative: 0 %
HCT: 30.8 % — ABNORMAL LOW (ref 36.0–46.0)
Hemoglobin: 9.4 g/dL — ABNORMAL LOW (ref 12.0–15.0)
Immature Granulocytes: 0 %
Lymphocytes Relative: 16 %
Lymphs Abs: 1.2 10*3/uL (ref 0.7–4.0)
MCH: 28.9 pg (ref 26.0–34.0)
MCHC: 30.5 g/dL (ref 30.0–36.0)
MCV: 94.8 fL (ref 80.0–100.0)
Monocytes Absolute: 0.4 10*3/uL (ref 0.1–1.0)
Monocytes Relative: 5 %
Neutro Abs: 5.9 10*3/uL (ref 1.7–7.7)
Neutrophils Relative %: 79 %
Platelets: 249 10*3/uL (ref 150–400)
RBC: 3.25 MIL/uL — ABNORMAL LOW (ref 3.87–5.11)
RDW: 14.6 % (ref 11.5–15.5)
WBC: 7.5 10*3/uL (ref 4.0–10.5)
nRBC: 0 % (ref 0.0–0.2)

## 2023-12-25 LAB — SAMPLE TO BLOOD BANK

## 2023-12-25 LAB — FERRITIN: Ferritin: 693 ng/mL — ABNORMAL HIGH (ref 11–307)

## 2023-12-26 ENCOUNTER — Other Ambulatory Visit: Payer: Self-pay | Admitting: Physician Assistant

## 2023-12-26 ENCOUNTER — Encounter: Payer: Self-pay | Admitting: Hematology

## 2023-12-26 DIAGNOSIS — D638 Anemia in other chronic diseases classified elsewhere: Secondary | ICD-10-CM

## 2024-01-01 ENCOUNTER — Other Ambulatory Visit

## 2024-01-04 LAB — UPEP/UIFE/LIGHT CHAINS/TP, 24-HR UR
Free Kappa Lt Chains,Ur: 87.47 mg/L — ABNORMAL HIGH (ref 1.17–86.46)
Free Kappa/Lambda Ratio: 6.65 (ref 1.83–14.26)
Free Lambda Lt Chains,Ur: 13.15 mg/L (ref 0.27–15.21)
Total Protein, Urine-Ur/day: 530 mg/(24.h) — ABNORMAL HIGH (ref 30–150)
Total Protein, Urine: 35.3 mg/dL
Total Volume: 1500

## 2024-01-08 ENCOUNTER — Other Ambulatory Visit

## 2024-01-08 DIAGNOSIS — M0579 Rheumatoid arthritis with rheumatoid factor of multiple sites without organ or systems involvement: Secondary | ICD-10-CM | POA: Diagnosis not present

## 2024-01-09 ENCOUNTER — Inpatient Hospital Stay: Attending: Hematology

## 2024-01-09 ENCOUNTER — Other Ambulatory Visit: Payer: Self-pay | Admitting: Physician Assistant

## 2024-01-09 DIAGNOSIS — D508 Other iron deficiency anemias: Secondary | ICD-10-CM | POA: Diagnosis not present

## 2024-01-09 DIAGNOSIS — D638 Anemia in other chronic diseases classified elsewhere: Secondary | ICD-10-CM

## 2024-01-09 DIAGNOSIS — R803 Bence Jones proteinuria: Secondary | ICD-10-CM

## 2024-01-09 DIAGNOSIS — M069 Rheumatoid arthritis, unspecified: Secondary | ICD-10-CM | POA: Insufficient documentation

## 2024-01-09 LAB — CBC
HCT: 29.5 % — ABNORMAL LOW (ref 36.0–46.0)
Hemoglobin: 9.3 g/dL — ABNORMAL LOW (ref 12.0–15.0)
MCH: 29.1 pg (ref 26.0–34.0)
MCHC: 31.5 g/dL (ref 30.0–36.0)
MCV: 92.2 fL (ref 80.0–100.0)
Platelets: 231 10*3/uL (ref 150–400)
RBC: 3.2 MIL/uL — ABNORMAL LOW (ref 3.87–5.11)
RDW: 13.8 % (ref 11.5–15.5)
WBC: 6.9 10*3/uL (ref 4.0–10.5)
nRBC: 0 % (ref 0.0–0.2)

## 2024-01-09 LAB — SAMPLE TO BLOOD BANK

## 2024-01-09 NOTE — Progress Notes (Signed)
 24-hour urine/UPEP re-ordered due to sample degradation, lab unable to perform immunofixation and electrophoresis on first sample.  New kit has been provided to patient for retesting.  Sheril Dines PA-C 01/09/2024 12:18 PM

## 2024-01-19 ENCOUNTER — Inpatient Hospital Stay

## 2024-01-22 ENCOUNTER — Inpatient Hospital Stay: Payer: 59

## 2024-01-22 ENCOUNTER — Inpatient Hospital Stay

## 2024-01-22 DIAGNOSIS — D500 Iron deficiency anemia secondary to blood loss (chronic): Secondary | ICD-10-CM

## 2024-01-22 DIAGNOSIS — D472 Monoclonal gammopathy: Secondary | ICD-10-CM

## 2024-01-22 DIAGNOSIS — M0579 Rheumatoid arthritis with rheumatoid factor of multiple sites without organ or systems involvement: Secondary | ICD-10-CM | POA: Diagnosis not present

## 2024-01-22 DIAGNOSIS — M069 Rheumatoid arthritis, unspecified: Secondary | ICD-10-CM | POA: Diagnosis not present

## 2024-01-22 DIAGNOSIS — D649 Anemia, unspecified: Secondary | ICD-10-CM

## 2024-01-22 DIAGNOSIS — D638 Anemia in other chronic diseases classified elsewhere: Secondary | ICD-10-CM

## 2024-01-22 DIAGNOSIS — D508 Other iron deficiency anemias: Secondary | ICD-10-CM | POA: Diagnosis not present

## 2024-01-22 DIAGNOSIS — R803 Bence Jones proteinuria: Secondary | ICD-10-CM

## 2024-01-22 LAB — COMPREHENSIVE METABOLIC PANEL WITH GFR
ALT: 11 U/L (ref 0–44)
AST: 20 U/L (ref 15–41)
Albumin: 2.6 g/dL — ABNORMAL LOW (ref 3.5–5.0)
Alkaline Phosphatase: 80 U/L (ref 38–126)
Anion gap: 12 (ref 5–15)
BUN: 18 mg/dL (ref 8–23)
CO2: 21 mmol/L — ABNORMAL LOW (ref 22–32)
Calcium: 8.7 mg/dL — ABNORMAL LOW (ref 8.9–10.3)
Chloride: 98 mmol/L (ref 98–111)
Creatinine, Ser: 1.26 mg/dL — ABNORMAL HIGH (ref 0.44–1.00)
GFR, Estimated: 47 mL/min — ABNORMAL LOW (ref >60)
Glucose, Bld: 420 mg/dL — ABNORMAL HIGH (ref 70–99)
Potassium: 4.1 mmol/L (ref 3.5–5.1)
Sodium: 131 mmol/L — ABNORMAL LOW (ref 135–145)
Total Bilirubin: 0.3 mg/dL (ref 0.0–1.2)
Total Protein: 6.1 g/dL — ABNORMAL LOW (ref 6.5–8.1)

## 2024-01-22 LAB — FERRITIN: Ferritin: 961 ng/mL — ABNORMAL HIGH (ref 11–307)

## 2024-01-22 LAB — IRON AND TIBC
Iron: 49 ug/dL (ref 28–170)
Saturation Ratios: 23 % (ref 10.4–31.8)
TIBC: 217 ug/dL — ABNORMAL LOW (ref 250–450)
UIBC: 168 ug/dL

## 2024-01-22 LAB — SAMPLE TO BLOOD BANK

## 2024-01-22 LAB — LACTATE DEHYDROGENASE: LDH: 136 U/L (ref 98–192)

## 2024-01-23 LAB — PROTEIN ELECTROPHORESIS, SERUM
A/G Ratio: 1.2 (ref 0.7–1.7)
Albumin ELP: 3 g/dL (ref 2.9–4.4)
Alpha-1-Globulin: 0.4 g/dL (ref 0.0–0.4)
Alpha-2-Globulin: 0.8 g/dL (ref 0.4–1.0)
Beta Globulin: 0.8 g/dL (ref 0.7–1.3)
Gamma Globulin: 0.5 g/dL (ref 0.4–1.8)
Globulin, Total: 2.6 g/dL (ref 2.2–3.9)
M-Spike, %: 0.1 g/dL — ABNORMAL HIGH
Total Protein ELP: 5.6 g/dL — ABNORMAL LOW (ref 6.0–8.5)

## 2024-01-23 LAB — KAPPA/LAMBDA LIGHT CHAINS
Kappa free light chain: 35.9 mg/L — ABNORMAL HIGH (ref 3.3–19.4)
Kappa, lambda light chain ratio: 1.53 (ref 0.26–1.65)
Lambda free light chains: 23.5 mg/L (ref 5.7–26.3)

## 2024-01-24 LAB — IMMUNOFIXATION ELECTROPHORESIS
IgA: 190 mg/dL (ref 87–352)
IgG (Immunoglobin G), Serum: 721 mg/dL (ref 586–1602)
IgM (Immunoglobulin M), Srm: 64 mg/dL (ref 26–217)
Total Protein ELP: 5.6 g/dL — ABNORMAL LOW (ref 6.0–8.5)

## 2024-01-25 LAB — UPEP/UIFE/LIGHT CHAINS/TP, 24-HR UR
% BETA, Urine: 32 %
ALPHA 1 URINE: 4.2 %
Albumin, U: 17.6 %
Alpha 2, Urine: 32.9 %
Free Kappa Lt Chains,Ur: 116.47 mg/L — ABNORMAL HIGH (ref 1.17–86.46)
Free Kappa/Lambda Ratio: 3.34 (ref 1.83–14.26)
Free Lambda Lt Chains,Ur: 34.89 mg/L — ABNORMAL HIGH (ref 0.27–15.21)
GAMMA GLOBULIN URINE: 13.2 %
Total Protein, Urine-Ur/day: 629 mg/(24.h) — ABNORMAL HIGH (ref 30–150)
Total Protein, Urine: 38.1 mg/dL
Total Volume: 1650

## 2024-01-28 NOTE — Progress Notes (Unsigned)
 Erica Beasley [address], [place] [address]   CLINIC:  Medical Oncology/Hematology  PCP:  Roise Cleaver, MD [address] [place] [address] [phone]   REASON FOR VISIT:  Follow-up for normocytic anemia  INTERVAL HISTORY:   Erica Beasley 68 y.o. female returns for routine follow-up of her normocytic anemia.  She was last seen by Sheril Dines PA-C on 10/16/2023.  She has not required any IV iron or blood transfusion since her last visit. She continues to have ongoing joint pain from her rheumatoid arthritis. Her last Rituxan was on 01/08/24 and 01/22/24. She continues to take hydroxychloroquine .  Blood counts have been adequate.  She has baseline fatigue.  She continues to deny any bright red blood per rectum, melena, epistaxis, or other signs of blood loss.  She denies any pica, dyspnea on exertion, restless legs, headaches, lightheadedness, syncope, or chest pain.  No B symptoms such as fever, chills, night sweats, unintentional weight loss. She reports little to no energy, with 75% appetite. Her weight is improved, with BMI up to 18.28.  ASSESSMENT & PLAN:  1.  NORMOCYTIC ANEMIA -  BMBX on 07/19/2021: Mildly hypercellular marrow with trilineage hematopoiesis.  No increased ring sideroblasts or blasts.  3% polytypic plasma cells.  No large aggregates of plasma cells.  Iron stores increased.  Chromosome analysis normal. - Etiology of patient's anemia remains multifactorial.   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 Myelosuppression from rheumatology medications.  Leflunomide  and sulfasalazine  discontinued due to anemia, with significant improvement in blood counts.  Remains on hydroxychloroquine  and started on Rituxan as of September 2024. Patient has been NEGATIVE for Hemoccult stool on multiple occasions within the past year.  No obvious rectal bleeding or melena.  EGD, colonoscopy, capsule  studies did not reveal any source of major blood loss. Bone marrow biopsy was nondiagnostic.  Hemolysis labs and nutritional deficiency work-up have been unremarkable, apart from significant functional iron deficiency (no improvement in hemoglobin after IV iron) No evidence of chronic kidney disease based on patient's creatinine or Cystatin C - She is taking vitamin B12 tablets (500 mcg EOD) at home. - Trial of monthly Feraheme x2 doses did NOT improve her hemoglobin.  Iron tablets discontinued due to elevated iron levels. - She has required intermittent blood transfusions, most recently on 12/30/2022 (Hgb 7.0) - Leflunomide  was discontinued by rheumatology on 01/18/2023.  Patient's blood count improved significantly after leflunomide  was stopped. - Started on rituximab  (1000 mg IV every 2 weeks x 2 doses, then every 4 months) by her rheumatologist, first dose on 06/07/2023. Prior to starting Rituxan her anemia had resolved with Hgb 12.1 (04/14/2023) and Hgb 11.3 (05/24/2023).  After starting Rituxan, she had a drop in hemoglobin to 10.5 on 06/21/2023, and Hgb 8.7 as of 06/28/2023.  Blood count gradually recovered after Rituxan.    Most recent dose of Rituxan on 09/18/2023 and 10/04/2023. Blood counts have been stable after Rituxan.  Seems to be doing better on maintenance dose than she did on loading dose. Next dose of Rituxan will be due in around 04/30/2024 NOTE: Discussed with Dr. Cheree Cords on 08/02/2023.  He agrees likely culprit is  anemia of chronic disease worsened by myelosuppression from Rituxan.  - Workup of recurrent anemia (September/October 2024) No nutritional deficiencies (ferritin 1267, iron saturation 46%, low TIBC 195.  Normal B12/MMA.) No evidence of Rituxan induced hemolysis (DAT negative, normal haptoglobin, normal LDH, normal bilirubin) Reticulocytes  elevated to 3.7%, which could indicate compensation for recent blood loss or signs of bone marrow recovery following suppression from  Rituxan - Most recent lab panel (01/14/2024): Ferritin 961 iron saturation 23%, low TIBC 217 Creatinine 1.26/GFR 47.   (Previously normal B12, folate, MMA, LDH as of January 2025) - CBC today (01/29/2024): Hgb 9.8/MCV 92.5. - She has baseline fatigue - No bright red blood per rectum or melena - PLAN: Due to Hgb <10.0 in the setting of CKD, we will start her on RETACRIT 10,000 units every 4 weeks to maintain Hgb >10. - Continue nephrology follow-up. - RTC 2 months with same-day labs and injection - Continue vitamin B12 500 mcg every other day.  (Recheck B12/MMA around July 2025)  2.  Bence-Jones proteinuria + MGUS - 24-hour urine done by Dr. Carrolyn Clan showed urine immunofixation positive for Bence-Jones proteinuria, kappa type.  24-hour total protein was 77 mg/day. - Free kappa light chains were 52.9, lambda light chains 29.8 with ratio of 1.78.  Serum immunofixation was unremarkable.  SPEP did not show any evidence of M spike. - Labs on 04/03/2020 shows negative immunofixation.  Kappa light chain 61.3, lambda light chains 32.3, ratio 1.9. - Skeletal survey on 04/03/2020 was negative for lytic lesions. - Most recent MGUS/myeloma panel (01/22/2024) 24-hour urine/UPEP/UIFE/TP: Total protein 629 mg/24-hr.  Urine immunofixation normal, M spike not observed.  Mildly elevated urine kappa light chains 116, mildly elevated lambda 34.8, normal urine FLC ratio 3.34. Serum immunofixation = IgG kappa monoclonal protein (previously not detected) SPEP with M spike 0.1 Mildly elevated serum kappa free light chain 35.9, normal lambda 23.5, normal ratio 1.53 Hgb 9.8, creatinine 1.26, calcium  8.7 - No new onset bone pain or neurologic changes  - PLAN: Continue monitoring of MGUS/Bence-Jones proteinuria with repeat labs in 6 months, urine study annually, and skeletal survey annually.  3.  Failure to thrive with low BMI - Intermittent episodes of poor appetite and weight loss associated with painful flareups of her  RA - Weight has been fluctuating from 90 to 100 pounds over the past year, with BMI around 17.  As of today's visit (01/29/2024), her weight has improved at 160 pounds, with BMI up to 18.28. - She has been evaluated by nutritionist (phone note 01/30/2023) - EGD on 04/28/2020 with stomach and duodenal biopsy negative. - CT CAP on 05/11/2020 showed long segment colonic thickening likely diverticular disease.  Stable small juxtapleural nodule in the right upper chest benign over 1 year.  Moderate large fat-containing left inguinal hernia.  Emphysema and diverticular calcification. - Colonoscopy on 06/08/2020 shows pancolonic diverticulosis, mild.  Normal-appearing colonic mucosa otherwise.  Random biopsies were negative.  4.  Rheumatoid arthritis - Follows with [facility] [place], [place]) - Leflunomide  discontinued in June 2024 due to anemia - Rituxan infusions started on 06/07/2023 - She continues to take hydroxychloroquine   5.  Osteoporosis - DEXA scan on 01/05/2022 with T score -2.7. - Recommended Prolia but her insurance did not cover it. - She started on Zometa  on 02/23/2022. - Normal vitamin D  (10/24/2022) at 38.1 - PLAN: Continue annual Zometa  for total duration of 3 to 5 years.  Next Zometa  due around 02/27/2024.  Continue calcium  supplements. - Check calcium  and Vitamin D  with next appotinment  PLAN SUMMARY:  >> Monthly CBC/BB sample + RETACRIT INJECTION (first dose today) >> SKELETAL SURVEY  >> Same-day labs (CBC/D, CMP, ferritin, iron/TIBC) + OFFICE visit + INJECTION in 2 months  **Zometa  will be due around 02/27/2024  REVIEW OF SYSTEMS:  Review of Systems  Constitutional:  Positive for fatigue. Negative for appetite change, chills, diaphoresis, fever and unexpected weight change.  HENT:   Negative for lump/mass, nosebleeds and trouble swallowing.   Eyes:  Negative for eye problems.  Respiratory:  Negative for cough, hemoptysis and shortness of breath.    Cardiovascular:  Negative for chest pain, leg swelling and palpitations.  Gastrointestinal:  Negative for abdominal pain, blood in stool, constipation, diarrhea, nausea and vomiting.  Genitourinary:  Negative for hematuria.   Musculoskeletal:  Positive for arthralgias and back pain.  Skin: Negative.   Neurological:  Negative for dizziness, extremity weakness (Residual deficit from CVA), headaches and light-headedness.  Hematological:  Does not bruise/bleed easily.  Psychiatric/Behavioral:  Negative for depression. The patient is not nervous/anxious.      PHYSICAL EXAM:  ECOG PERFORMANCE STATUS: 1 - Symptomatic but completely ambulatory  Today's Vitals   01/29/24 1008  BP: 133/78  Pulse: 75  Resp: 16  Temp: 98.3 F (36.8 C)  TempSrc: Oral  SpO2: 100%  Weight: 106 lb 7.7 oz (48.3 kg)  PainSc: 0-No pain    Physical Exam Vitals reviewed.  Constitutional:      Appearance: Normal appearance. She is underweight.  Cardiovascular:     Rate and Rhythm: Normal rate and regular rhythm.     Pulses: Normal pulses.     Heart sounds: Normal heart sounds.  Pulmonary:     Effort: Pulmonary effort is normal.     Breath sounds: Normal breath sounds.  Neurological:     Mental Status: She is alert and oriented to person, place, and time. Mental status is at baseline.  Psychiatric:        Mood and Affect: Mood normal.        Behavior: Behavior normal.    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 Lydia Sams: mild diverticulsis in sigmoid colon.    COLONOSCOPY WITH PROPOFOL  N/A 09/16/2019   Rourk: Diverticulosis, random colon biopsies negative for microscopic colitis.   COLONOSCOPY WITH PROPOFOL  N/A 06/08/2020   Procedure: COLONOSCOPY WITH PROPOFOL ;  Surgeon: Suzette Espy, MD;  [facility];  Service: Endoscopy;  Laterality: N/A;  9:15am   ESOPHAGOGASTRODUODENOSCOPY (EGD) WITH PROPOFOL  N/A 04/28/2020   Procedure: ESOPHAGOGASTRODUODENOSCOPY (EGD) WITH PROPOFOL ;  Surgeon: Garnette Ka  M, MD;  [facility];  Service: Endoscopy;  Laterality: N/A;  2:15   GIVENS CAPSULE STUDY N/A 08/12/2022   Procedure: GIVENS CAPSULE STUDY;  Surgeon: Suzette Espy, MD;  [facility];  Service: Endoscopy;  Laterality: N/A;   YAG LASER APPLICATION Left 07/27/2015   Procedure: YAG LASER APPLICATION;  Surgeon: Clay Cummins, MD;  Location: [facility];  Service: Ophthalmology;  Laterality: Left;    SOCIAL HISTORY:  Social History   Socioeconomic History   Marital status: Widowed    Spouse name: Not on file   Number of children: 1   Years of education: Not on file   Highest education level: 12th grade  Occupational History   Occupation: DISABLED  Tobacco Use   Smoking status: Former    Current packs/day: 0.00    Average packs/day: 1 pack/day for 15.0 years (15.0 ttl pk-yrs)    Types: Cigarettes     Start date: 09/11/2002    Quit date: 09/11/2017    Years since quitting: 6.3   Smokeless tobacco: Former  Building services engineer status: Never Used  Substance and Sexual Activity   Alcohol use: Never   Drug use: Never   Sexual activity: Not Currently  Other Topics Concern   Not on file  Social History Narrative   Disabled, widow, lives with daughter and her family. Patient still drives.    Social Drivers of Corporate investment banker Strain: Low Risk  (12/11/2023)   Overall Financial Resource Strain (CARDIA)    Difficulty of Paying Living Expenses: Not hard at all  Food Insecurity: No Food Insecurity (12/11/2023)   Hunger Vital Sign    Worried About Running Out of Food in the Last Year: Never true    Ran Out of Food in the Last Year: Never true  Transportation Needs: No Transportation Needs (12/11/2023)   PRAPARE - Administrator, Civil Service (Medical): No    Lack of Transportation (Non-Medical): No  Physical Activity: Inactive (12/11/2023)   Exercise Vital Sign    Days of Exercise per Week: 0 days    Minutes of Exercise per Session: 30 min  Stress: No Stress Concern Present (12/11/2023)   [employer] of Occupational Health - Occupational Stress Questionnaire    Feeling of Stress : Only a little  Social Connections: Socially Isolated (12/11/2023)   Social Connection and Isolation Panel [NHANES]    Frequency of Communication with Friends and Family: More than three times a week    Frequency of Social Gatherings with Friends and Family: Twice a week    Attends Religious Services: Never    Database administrator or Organizations: No    Attends Engineer, structural: Not on file    Marital Status: Widowed  Intimate Partner Violence: Not At Risk (12/13/2022)   Humiliation, Afraid, Rape, and Kick questionnaire    Fear of Current or Ex-Partner: No    Emotionally Abused: No    Physically Abused: No    Sexually Abused: No    FAMILY HISTORY:  Family  History  Problem Relation Age of Onset   Stroke Mother    Heart disease Mother    Diabetes Mother    Breast cancer Mother    Other Father        stomach taken out for some reason   Migraines Daughter    Colon cancer Neg Hx     CURRENT MEDICATIONS:  Outpatient Encounter Medications as of 01/29/2024  Medication Sig   albuterol  (VENTOLIN  HFA) 108 (90 Base) MCG/ACT inhaler Inhale 2 puffs into the lungs every 6 (six) hours as needed for wheezing or shortness of breath.   aspirin  EC 81 MG tablet Take 1 tablet (81 mg total) by mouth daily. Swallow whole.   Blood Glucose Monitoring Suppl DEVI 1 each by Does not apply route in the morning, at noon, and at bedtime. May substitute to any manufacturer covered by patient's insurance.   calcium  carbonate (OSCAL) 1500 (600 Ca) MG TABS tablet Take 1,500 mg by mouth 2 (two) times daily with a meal.   ciprofloxacin  (CIPRO ) 500 MG tablet Take 1 tablet (500 mg total) by mouth 2 (two) times daily.   colchicine 0.6 MG tablet Take 0.6 mg by mouth daily.   Cyanocobalamin  (B-12) 500 MCG TABS Take 500 mcg by mouth every other day.   desvenlafaxine  (PRISTIQ ) 50 MG 24 hr tablet Take 1 tablet (50 mg total) by mouth daily.   etodolac (LODINE) 300 MG capsule Take 300 mg by mouth 2 (two) times daily.   folic acid  (FOLVITE ) 1 MG tablet TAKE ONE TABLET ONCE DAILY   gabapentin  (NEURONTIN ) 300 MG capsule Take 1 capsule (300 mg total) by mouth 3 (three) times daily.   hydroxychloroquine  (PLAQUENIL ) 200 MG tablet Take 200 mg by mouth 2 (two) times daily.   levothyroxine  (SYNTHROID ) 125 MCG tablet Take 1 tablet (125 mcg total) by mouth every morning.   liothyronine  (CYTOMEL ) 5 MCG tablet Take 2 tablets (10 mcg total) by mouth daily.   megestrol  (MEGACE ) 40 MG/ML suspension Take 10 mLs (400 mg total) by mouth 2 (two) times daily.   metFORMIN  (GLUCOPHAGE -XR) 750 MG 24 hr tablet Take 1 tablet (750 mg total) by mouth 2 (two) times daily with a meal.   potassium chloride  SA  (KLOR-CON  M) 20 MEQ tablet TAKE ONE TABLET ONCE DAILY   predniSONE  (DELTASONE ) 5 MG tablet Take 5-10 mg by mouth daily.   rosuvastatin  (CRESTOR ) 40 MG tablet Take 1 tablet (40 mg total) by mouth daily.   traZODone  (DESYREL ) 100 MG tablet Take 1 tablet (100 mg total) by mouth at bedtime as needed. for sleep   [DISCONTINUED] epoetin alfa-epbx (RETACRIT) injection 10,000 Units    No facility-administered encounter medications on file as of 01/29/2024.    ALLERGIES:  Allergies  Allergen Reactions   Adalimumab     Other reaction(s): no response Pt is unaware of this Other reaction(s): no response   Methotrexate Other (See Comments)    Other reaction(s): GI side effects   Sulfasalazine      Other Reaction(s): Anemia   Upadacitinib Other (See Comments)    Other reaction(s): GI side effects    LABORATORY DATA:  I have reviewed the labs as listed.  CBC    Component Value Date/Time   WBC 6.7 01/29/2024 1026   RBC 3.32 (L) 01/29/2024 1026   HGB 9.8 (L) 01/29/2024 1026   HGB 11.0 (L) 11/27/2023 1427   HCT 30.7 (L) 01/29/2024 1026   HCT 32.9 (L) 11/27/2023 1427   PLT 256 01/29/2024 1026   PLT 240 11/27/2023 1427   MCV 92.5 01/29/2024 1026   MCV 90 11/27/2023 1427   MCH 29.5 01/29/2024 1026   MCHC 31.9 01/29/2024 1026   RDW 14.2 01/29/2024 1026   RDW 13.9 11/27/2023 1427   LYMPHSABS 2.0 01/29/2024 1026   LYMPHSABS 0.8 11/27/2023 1427   MONOABS 0.5 01/29/2024 1026   EOSABS 0.1 01/29/2024 1026   EOSABS 0.0 11/27/2023 1427  BASOSABS 0.0 01/29/2024 1026   BASOSABS 0.0 11/27/2023 1427      Latest Ref Rng & Units 01/22/2024    2:43 PM 11/27/2023    2:27 PM 10/09/2023   11:07 AM  CMP  Glucose 70 - 99 mg/dL 324  401  027   BUN 8 - 23 mg/dL 18  18  20    Creatinine 0.44 - 1.00 mg/dL 2.53  6.64  4.03   Sodium 135 - 145 mmol/L 131  133  133   Potassium 3.5 - 5.1 mmol/L 4.1  4.8  4.0   Chloride 98 - 111 mmol/L 98  99  102   CO2 22 - 32 mmol/L 21  22  22    Calcium  8.9 - 10.3 mg/dL 8.7   9.1  9.2   Total Protein 6.5 - 8.1 g/dL 6.1  5.9  6.4   Total Bilirubin 0.0 - 1.2 mg/dL 0.3  0.4  0.3   Alkaline Phos 38 - 126 U/L 80  98  82   AST 15 - 41 U/L 20  15  16    ALT 0 - 44 U/L 11  25  27      DIAGNOSTIC IMAGING:  I have independently reviewed the relevant imaging and discussed with the patient.   WRAP UP:  All questions were answered. The patient knows to call the clinic with any problems, questions or concerns.  Medical decision making: Moderate  Time spent on visit: I spent 20 minutes counseling the patient face to face. The total time spent in the appointment was 30 minutes and more than 50% was on counseling.  Sonnie Dusky, PA-C  01/29/24 11:48 AM

## 2024-01-29 ENCOUNTER — Inpatient Hospital Stay

## 2024-01-29 ENCOUNTER — Ambulatory Visit (HOSPITAL_COMMUNITY)
Admission: RE | Admit: 2024-01-29 | Discharge: 2024-01-29 | Disposition: A | Discharge status: Home / Self Care | Source: Ambulatory Visit | Attending: Physician Assistant | Admitting: Physician Assistant

## 2024-01-29 ENCOUNTER — Inpatient Hospital Stay: Payer: 59 | Attending: Hematology | Admitting: Physician Assistant

## 2024-01-29 VITALS — BP 133/78 | HR 75 | Temp 98.3°F | Resp 16 | Wt 106.5 lb

## 2024-01-29 DIAGNOSIS — M0579 Rheumatoid arthritis with rheumatoid factor of multiple sites without organ or systems involvement: Secondary | ICD-10-CM | POA: Diagnosis not present

## 2024-01-29 DIAGNOSIS — M810 Age-related osteoporosis without current pathological fracture: Secondary | ICD-10-CM

## 2024-01-29 DIAGNOSIS — N1831 Chronic kidney disease, stage 3a: Secondary | ICD-10-CM | POA: Diagnosis not present

## 2024-01-29 DIAGNOSIS — D638 Anemia in other chronic diseases classified elsewhere: Secondary | ICD-10-CM | POA: Diagnosis not present

## 2024-01-29 DIAGNOSIS — M160 Bilateral primary osteoarthritis of hip: Secondary | ICD-10-CM | POA: Diagnosis not present

## 2024-01-29 DIAGNOSIS — D631 Anemia in chronic kidney disease: Secondary | ICD-10-CM | POA: Diagnosis not present

## 2024-01-29 DIAGNOSIS — M51369 Other intervertebral disc degeneration, lumbar region without mention of lumbar back pain or lower extremity pain: Secondary | ICD-10-CM | POA: Diagnosis not present

## 2024-01-29 DIAGNOSIS — D472 Monoclonal gammopathy: Secondary | ICD-10-CM | POA: Diagnosis not present

## 2024-01-29 LAB — CBC WITH DIFFERENTIAL/PLATELET
Abs Immature Granulocytes: 0.02 10*3/uL (ref 0.00–0.07)
Basophils Absolute: 0 10*3/uL (ref 0.0–0.1)
Basophils Relative: 0 %
Eosinophils Absolute: 0.1 10*3/uL (ref 0.0–0.5)
Eosinophils Relative: 1 %
HCT: 30.7 % — ABNORMAL LOW (ref 36.0–46.0)
Hemoglobin: 9.8 g/dL — ABNORMAL LOW (ref 12.0–15.0)
Immature Granulocytes: 0 %
Lymphocytes Relative: 29 %
Lymphs Abs: 2 10*3/uL (ref 0.7–4.0)
MCH: 29.5 pg (ref 26.0–34.0)
MCHC: 31.9 g/dL (ref 30.0–36.0)
MCV: 92.5 fL (ref 80.0–100.0)
Monocytes Absolute: 0.5 10*3/uL (ref 0.1–1.0)
Monocytes Relative: 8 %
Neutro Abs: 4.1 10*3/uL (ref 1.7–7.7)
Neutrophils Relative %: 62 %
Platelets: 256 10*3/uL (ref 150–400)
RBC: 3.32 MIL/uL — ABNORMAL LOW (ref 3.87–5.11)
RDW: 14.2 % (ref 11.5–15.5)
WBC: 6.7 10*3/uL (ref 4.0–10.5)
nRBC: 0 % (ref 0.0–0.2)

## 2024-01-29 MED ORDER — EPOETIN ALFA-EPBX 10000 UNIT/ML IJ SOLN
10000.0000 [IU] | Freq: Once | INTRAMUSCULAR | Status: DC
Start: 2024-01-29 — End: 2024-01-29

## 2024-01-29 MED ORDER — EPOETIN ALFA-EPBX 10000 UNIT/ML IJ SOLN
10000.0000 [IU] | Freq: Once | INTRAMUSCULAR | Status: AC
  Administered 2024-01-29: 10000 [IU] via SUBCUTANEOUS
  Filled 2024-01-29: qty 1

## 2024-01-29 NOTE — Progress Notes (Signed)
 Patient seen by Sheril Dines PA. Patient to get a Retacrit injection today. Hgb/Hct 9.8/30.7. Patient tolerated injection in left arm with no complaints voiced.  Site clean and dry with no bruising or swelling noted.  No complaints of pain.  Discharged with vital signs stable and no signs or symptoms of distress noted.

## 2024-01-29 NOTE — Patient Instructions (Addendum)
 [facility] at [facility] **VISIT SUMMARY & IMPORTANT INSTRUCTIONS **   You were seen today by Sheril Dines PA-C for your anemia.    ANEMIA: Your blood counts had gotten better after your leflunomide  was stopped.  We believe that Rituxan has also been contributing to your anemia, although you have some chronic anemia related to chronic inflammation. We will continue to check blood counts once a month.  ANOTHER REASON FOR YOUR ANEMIA IS YOUR CHRONIC KIDNEY DISEASE...    We will start giving you RETACRIT INJECTIONS once a month to help your body make more healthy blood cells.  (See attached handout for additional information) I will see you for FOLLOW UP in 2 months to make sure that you are doing well on these injections.  MGUS ("monoclonal gammopathy of undetermined significance") As we discussed, you have a slightly increased amount of abnormal protein in your urine, which can be a sign of a precancerous condition. We will continue to check these labs every 6 months. We will also need to check whole-body x-rays to make sure you do not have any signs of this precancerous condition in your bones.  ** Please continue to work on eating plenty of calories, even when you are in pain.  It is important that you do not lose any more weight.  ** Thank you for trusting me with your healthcare!  I strive to provide all of my patients with quality care at each visit.  If you receive a survey for this visit, I would be so grateful to you for taking the time to provide feedback.  Thank you in advance!  ~ Cannan Beeck                   Dr. Paulett Boros   &   Sheril Dines, PA-C   - - - - - - - - - - - - - - - - - -    Thank you for choosing [facility] at [facility] to provide your oncology and hematology care.  To afford each patient quality time with our provider, please arrive at least 15 minutes before your scheduled appointment time.   If  you have a lab appointment with the Cancer Center please come in thru the Main Entrance and check in at the main information desk.  You need to re-schedule your appointment should you arrive 10 or more minutes late.  We strive to give you quality time with our providers, and arriving late affects you and other patients whose appointments are after yours.  Also, if you no show three or more times for appointments you may be dismissed from the clinic at the providers discretion.     Again, thank you for choosing [facility].  Our hope is that these requests will decrease the amount of time that you wait before being seen by our physicians.       _____________________________________________________________  Should you have questions after your visit to [facility], please contact our office at [phone] and follow the prompts.  Our office hours are 8:00 a.m. and 4:30 p.m. Monday - Friday.  Please note that voicemails left after 4:00 p.m. may not be returned until the following business day.  We are closed weekends and major holidays.  You do have access to a nurse 24-7, just call the main number to the clinic [phone] and do not press any options, hold on the line  and a nurse will answer the phone.    For prescription refill requests, have your pharmacy contact our office and allow 72 hours.

## 2024-01-29 NOTE — Patient Instructions (Signed)
 [facility] [place] - A DEPT OF [facility]  Discharge Instructions: Thank you for choosing [facility] to provide your oncology and hematology care.  If you have a lab appointment with the Cancer Center - please note that after April 8th, 2024, all labs will be drawn in the cancer center.  You do not have to check in or register with the main entrance as you have in the past but will complete your check-in in the cancer center.  Wear comfortable clothing and clothing appropriate for easy access to any Portacath or PICC line.   We strive to give you quality time with your provider. You may need to reschedule your appointment if you arrive late (15 or more minutes).  Arriving late affects you and other patients whose appointments are after yours.  Also, if you miss three or more appointments without notifying the office, you may be dismissed from the clinic at the provider's discretion.      For prescription refill requests, have your pharmacy contact our office and allow 72 hours for refills to be completed.    Today you received the following Retacrit injection.  Epoetin Alfa Injection What is this medication? EPOETIN ALFA (e POE e tin AL fa) treats low levels of red blood cells (anemia) caused by kidney disease, chemotherapy, or HIV medications. It can also be used in people who are at risk for blood loss during surgery. It works by Systems analyst make more red blood cells, which reduces the need for blood transfusions. This medicine may be used for other purposes; ask your health care provider or pharmacist if you have questions. COMMON BRAND NAME(S): Epogen, Procrit, Retacrit What should I tell my care team before I take this medication? They need to know if you have any of these conditions: Blood clots Cancer Heart disease High blood pressure On dialysis Seizures Stroke An unusual or allergic reaction to epoetin alfa, albumin, benzyl alcohol,  other medications, foods, dyes, or preservatives Pregnant or trying to get pregnant Breast-feeding How should I use this medication? This medication is injected into a vein or under the skin. It is usually given by your care team in a hospital or clinic setting. It may also be given at home. If you get this medication at home, you will be taught how to prepare and give it. Use exactly as directed. Take it as directed on the prescription label at the same time every day. Keep taking it unless your care team tells you to stop. It is important that you put your used needles and syringes in a special sharps container. Do not put them in a trash can. If you do not have a sharps container, call your pharmacist or care team to get one. A special MedGuide will be given to you by the pharmacist with each prescription and refill. Be sure to read this information carefully each time. Talk to your care team about the use of this medication in children. While this medication may be used in children as young as 1 month of age for selected conditions, precautions do apply. Overdosage: If you think you have taken too much of this medicine contact a poison control center or emergency room at once. NOTE: This medicine is only for you. Do not share this medicine with others. What if I miss a dose? If you miss a dose, take it as soon as you can. If it is almost time for your next  dose, take only that dose. Do not take double or extra doses. What may interact with this medication? Darbepoetin alfa Methoxy polyethylene glycol-epoetin beta This list may not describe all possible interactions. Give your health care provider a list of all the medicines, herbs, non-prescription drugs, or dietary supplements you use. Also tell them if you smoke, drink alcohol, or use illegal drugs. Some items may interact with your medicine. What should I watch for while using this medication? Visit your care team for regular checks on your  progress. Check your blood pressure as directed. Know what your blood pressure should be and when to contact your care team. Your condition will be monitored carefully while you are receiving this medication. You may need blood work while taking this medication. What side effects may I notice from receiving this medication? Side effects that you should report to your care team as soon as possible: Allergic reactions--skin rash, itching, hives, swelling of the face, lips, tongue, or throat Blood clot--pain, swelling, or warmth in the leg, shortness of breath, chest pain Heart attack--pain or tightness in the chest, shoulders, arms, or jaw, nausea, shortness of breath, cold or clammy skin, feeling faint or lightheaded Increase in blood pressure Rash, fever, and swollen lymph nodes Redness, blistering, peeling, or loosening of the skin, including inside the mouth Seizures Stroke--sudden numbness or weakness of the face, arm, or leg, trouble speaking, confusion, trouble walking, loss of balance or coordination, dizziness, severe headache, change in vision Side effects that usually do not require medical attention (report to your care team if they continue or are bothersome): Bone, joint, or muscle pain Cough Headache Nausea Pain, redness, or irritation at injection site This list may not describe all possible side effects. Call your doctor for medical advice about side effects. You may report side effects to FDA at 1-800-FDA-1088. Where should I keep my medication? Keep out of the reach of children and pets. Store in a refrigerator. Do not freeze. Do not shake. Protect from light. Keep this medication in the original container until you are ready to take it. See product for storage information. Get rid of any unused medication after the expiration date. To get rid of medications that are no longer needed or have expired: Take the medication to a medication take-back program. Check with your  pharmacy or law enforcement to find a location. If you cannot return the medication, ask your pharmacist or care team how to get rid of the medication safely. NOTE: This sheet is a summary. It may not cover all possible information. If you have questions about this medicine, talk to your doctor, pharmacist, or health care provider.  2024 Elsevier/Gold Standard (2022-01-14 00:00:00)   To help prevent nausea and vomiting after your treatment, we encourage you to take your nausea medication as directed.  BELOW ARE SYMPTOMS THAT SHOULD BE REPORTED IMMEDIATELY: *FEVER GREATER THAN 100.4 F (38 C) OR HIGHER *CHILLS OR SWEATING *NAUSEA AND VOMITING THAT IS NOT CONTROLLED WITH YOUR NAUSEA MEDICATION *UNUSUAL SHORTNESS OF BREATH *UNUSUAL BRUISING OR BLEEDING *URINARY PROBLEMS (pain or burning when urinating, or frequent urination) *BOWEL PROBLEMS (unusual diarrhea, constipation, pain near the anus) TENDERNESS IN MOUTH AND THROAT WITH OR WITHOUT PRESENCE OF ULCERS (sore throat, sores in mouth, or a toothache) UNUSUAL RASH, SWELLING OR PAIN  UNUSUAL VAGINAL DISCHARGE OR ITCHING   Items with * indicate a potential emergency and should be followed up as soon as possible or go to the Emergency Department if any problems should occur.  Please show the CHEMOTHERAPY ALERT CARD or IMMUNOTHERAPY ALERT CARD at check-in to the Emergency Department and triage nurse.  Should you have questions after your visit or need to cancel or reschedule your appointment, please contact [facility] [place] - A DEPT OF [facility] [phone]  and follow the prompts.  Office hours are 8:00 a.m. to 4:30 p.m. Monday - Friday. Please note that voicemails left after 4:00 p.m. may not be returned until the following business day.  We are closed weekends and major holidays. You have access to a nurse at all times for urgent questions. Please call the main number to the clinic [phone] and follow the  prompts.  For any non-urgent questions, you may also contact your provider using MyChart. We now offer e-Visits for anyone 34 and older to request care online for non-urgent symptoms. For details visit [email or web address].   Also download the MyChart app! Go to the app store, search "MyChart", open the app, select [place], and log in with your MyChart username and password.

## 2024-02-05 ENCOUNTER — Other Ambulatory Visit: Payer: Self-pay | Admitting: Family Medicine

## 2024-02-13 ENCOUNTER — Ambulatory Visit: Admitting: Family Medicine

## 2024-02-13 ENCOUNTER — Encounter: Payer: Self-pay | Admitting: Family Medicine

## 2024-02-13 VITALS — BP 128/78 | HR 89 | Temp 97.5°F | Ht 64.0 in | Wt 103.0 lb

## 2024-02-13 DIAGNOSIS — L2989 Other pruritus: Secondary | ICD-10-CM

## 2024-02-13 DIAGNOSIS — W57XXXA Bitten or stung by nonvenomous insect and other nonvenomous arthropods, initial encounter: Secondary | ICD-10-CM

## 2024-02-13 MED ORDER — DOXYCYCLINE MONOHYDRATE 100 MG PO TABS: 100.0000 mg | ORAL_TABLET | Freq: Two times a day (BID) | ORAL | 0 refills | Status: DC

## 2024-02-13 NOTE — Progress Notes (Signed)
 Chief Complaint  Patient presents with   Tick Bite    Was on her back. Tick not on there. Itching and theres a bump. Noticed it 5 days ago. No fever or fatigue.    HPI  Patient presents today for tick bite.  It was removed 4 days ago.  It was located on the posterior right flank just below the bra strap.  She noticed it itching and her daughter removed what she thought was a tick.  No idea was given of what size how much it was engorged etc.  There is no clear indication of how long it had been in place.  The patient has had no rash or fever.  PMH: Smoking status noted Review of Systems  Objective: BP 128/78   Pulse 89   Temp (!) 97.5 F (36.4 C)   Ht 5\' 4"  (1.626 m)   Wt 103 lb (46.7 kg)   SpO2 96%   BMI 17.68 kg/m  Gen: NAD, alert, cooperative with exam HEENT: NCAT, EOMI, PERRL CV: RRR, good S1/S2, no murmur Resp: CTABL, no wheezes, non-labored Abd: SNTND, BS present, no guarding or organomegaly Ext: No edema, warm Neuro: Alert and oriented, No gross deficits  Tick bite, initial encounter  Other orders -     Doxycycline  Monohydrate; Take 1 tablet (100 mg total) by mouth 2 (two) times daily.  Dispense: 20 tablet; Refill: 0

## 2024-02-22 DIAGNOSIS — N1831 Chronic kidney disease, stage 3a: Secondary | ICD-10-CM | POA: Diagnosis not present

## 2024-02-22 DIAGNOSIS — I129 Hypertensive chronic kidney disease with stage 1 through stage 4 chronic kidney disease, or unspecified chronic kidney disease: Secondary | ICD-10-CM | POA: Diagnosis not present

## 2024-02-22 DIAGNOSIS — R809 Proteinuria, unspecified: Secondary | ICD-10-CM | POA: Diagnosis not present

## 2024-02-22 DIAGNOSIS — E1122 Type 2 diabetes mellitus with diabetic chronic kidney disease: Secondary | ICD-10-CM | POA: Diagnosis not present

## 2024-02-23 ENCOUNTER — Other Ambulatory Visit: Payer: Self-pay

## 2024-02-23 DIAGNOSIS — D638 Anemia in other chronic diseases classified elsewhere: Secondary | ICD-10-CM

## 2024-02-26 ENCOUNTER — Inpatient Hospital Stay: Attending: Hematology

## 2024-02-26 ENCOUNTER — Inpatient Hospital Stay

## 2024-02-26 VITALS — BP 142/65 | HR 78 | Temp 99.0°F | Resp 19

## 2024-02-26 DIAGNOSIS — M069 Rheumatoid arthritis, unspecified: Secondary | ICD-10-CM | POA: Insufficient documentation

## 2024-02-26 DIAGNOSIS — D638 Anemia in other chronic diseases classified elsewhere: Secondary | ICD-10-CM | POA: Diagnosis not present

## 2024-02-26 DIAGNOSIS — M810 Age-related osteoporosis without current pathological fracture: Secondary | ICD-10-CM | POA: Insufficient documentation

## 2024-02-26 DIAGNOSIS — D631 Anemia in chronic kidney disease: Secondary | ICD-10-CM

## 2024-02-26 LAB — CBC
HCT: 34.3 % — ABNORMAL LOW (ref 36.0–46.0)
Hemoglobin: 10.5 g/dL — ABNORMAL LOW (ref 12.0–15.0)
MCH: 28.7 pg (ref 26.0–34.0)
MCHC: 30.6 g/dL (ref 30.0–36.0)
MCV: 93.7 fL (ref 80.0–100.0)
Platelets: 270 10*3/uL (ref 150–400)
RBC: 3.66 MIL/uL — ABNORMAL LOW (ref 3.87–5.11)
RDW: 14 % (ref 11.5–15.5)
WBC: 7.2 10*3/uL (ref 4.0–10.5)
nRBC: 0 % (ref 0.0–0.2)

## 2024-02-26 LAB — COMPREHENSIVE METABOLIC PANEL WITH GFR
ALT: 11 U/L (ref 0–44)
AST: 17 U/L (ref 15–41)
Albumin: 2.9 g/dL — ABNORMAL LOW (ref 3.5–5.0)
Alkaline Phosphatase: 80 U/L (ref 38–126)
Anion gap: 12 (ref 5–15)
BUN: 23 mg/dL (ref 8–23)
CO2: 20 mmol/L — ABNORMAL LOW (ref 22–32)
Calcium: 9.3 mg/dL (ref 8.9–10.3)
Chloride: 105 mmol/L (ref 98–111)
Creatinine, Ser: 0.95 mg/dL (ref 0.44–1.00)
GFR, Estimated: >60 mL/min (ref >60)
Glucose, Bld: 221 mg/dL — ABNORMAL HIGH (ref 70–99)
Potassium: 3.7 mmol/L (ref 3.5–5.1)
Sodium: 137 mmol/L (ref 135–145)
Total Bilirubin: 0.3 mg/dL (ref 0.0–1.2)
Total Protein: 6.3 g/dL — ABNORMAL LOW (ref 6.5–8.1)

## 2024-02-26 LAB — SAMPLE TO BLOOD BANK

## 2024-02-26 MED ORDER — SODIUM CHLORIDE 0.9 % IV SOLN: Freq: Once | INTRAVENOUS | Status: AC

## 2024-02-26 MED ORDER — ZOLEDRONIC ACID 4 MG/100ML IV SOLN
4.0000 mg | Freq: Once | INTRAVENOUS | Status: AC
  Administered 2024-02-26: 4 mg via INTRAVENOUS
  Filled 2024-02-26: qty 100

## 2024-02-26 NOTE — Patient Instructions (Signed)
 [facility] [place] - A DEPT OF [facility]  Discharge Instructions: Thank you for choosing [facility] to provide your oncology and hematology care.  If you have a lab appointment with the Cancer Center - please note that after April 8th, 2024, all labs will be drawn in the cancer center.  You do not have to check in or register with the main entrance as you have in the past but will complete your check-in in the cancer center.  Wear comfortable clothing and clothing appropriate for easy access to any Portacath or PICC line.   We strive to give you quality time with your provider. You may need to reschedule your appointment if you arrive late (15 or more minutes).  Arriving late affects you and other patients whose appointments are after yours.  Also, if you miss three or more appointments without notifying the office, you may be dismissed from the clinic at the provider's discretion.      For prescription refill requests, have your pharmacy contact our office and allow 72 hours for refills to be completed.    Today you received Zometa 4mg  IV      BELOW ARE SYMPTOMS THAT SHOULD BE REPORTED IMMEDIATELY: *FEVER GREATER THAN 100.4 F (38 C) OR HIGHER *CHILLS OR SWEATING *NAUSEA AND VOMITING THAT IS NOT CONTROLLED WITH YOUR NAUSEA MEDICATION *UNUSUAL SHORTNESS OF BREATH *UNUSUAL BRUISING OR BLEEDING *URINARY PROBLEMS (pain or burning when urinating, or frequent urination) *BOWEL PROBLEMS (unusual diarrhea, constipation, pain near the anus) TENDERNESS IN MOUTH AND THROAT WITH OR WITHOUT PRESENCE OF ULCERS (sore throat, sores in mouth, or a toothache) UNUSUAL RASH, SWELLING OR PAIN  UNUSUAL VAGINAL DISCHARGE OR ITCHING   Items with * indicate a potential emergency and should be followed up as soon as possible or go to the Emergency Department if any problems should occur.  Please show the CHEMOTHERAPY ALERT CARD or IMMUNOTHERAPY ALERT CARD at check-in to  the Emergency Department and triage nurse.  Should you have questions after your visit or need to cancel or reschedule your appointment, please contact [facility] [place] - A DEPT OF [facility] [phone]  and follow the prompts.  Office hours are 8:00 a.m. to 4:30 p.m. Monday - Friday. Please note that voicemails left after 4:00 p.m. may not be returned until the following business day.  We are closed weekends and major holidays. You have access to a nurse at all times for urgent questions. Please call the main number to the clinic [phone] and follow the prompts.  For any non-urgent questions, you may also contact your provider using MyChart. We now offer e-Visits for anyone 66 and older to request care online for non-urgent symptoms. For details visit [email or web address].   Also download the MyChart app! Go to the app store, search "MyChart", open the app, select , and log in with your MyChart username and password.

## 2024-02-26 NOTE — Progress Notes (Signed)
 No Retacrit  today per order parameters for HGB of 10.5.

## 2024-02-26 NOTE — Progress Notes (Signed)
 Patient presents today for 4mg  IV Zometa  per provider's order. Vital signs stable and patient voiced no new complaints at this time.  Peripheral IV started with good blood return pre and post infusion.  Discharged from clinic ambulatory in stable condition. Alert and oriented x 3. F/U with [facility] as scheduled.

## 2024-02-28 ENCOUNTER — Ambulatory Visit: Payer: Self-pay | Admitting: Family Medicine

## 2024-02-28 ENCOUNTER — Ambulatory Visit (INDEPENDENT_AMBULATORY_CARE_PROVIDER_SITE_OTHER)

## 2024-02-28 ENCOUNTER — Ambulatory Visit (INDEPENDENT_AMBULATORY_CARE_PROVIDER_SITE_OTHER): Admitting: Family Medicine

## 2024-02-28 ENCOUNTER — Encounter: Payer: Self-pay | Admitting: Family Medicine

## 2024-02-28 VITALS — BP 122/74 | HR 97 | Temp 98.2°F | Ht 64.0 in | Wt 101.0 lb

## 2024-02-28 DIAGNOSIS — E756 Lipid storage disorder, unspecified: Secondary | ICD-10-CM | POA: Diagnosis not present

## 2024-02-28 DIAGNOSIS — E1169 Type 2 diabetes mellitus with other specified complication: Secondary | ICD-10-CM | POA: Insufficient documentation

## 2024-02-28 DIAGNOSIS — M25462 Effusion, left knee: Secondary | ICD-10-CM | POA: Diagnosis not present

## 2024-02-28 DIAGNOSIS — R531 Weakness: Secondary | ICD-10-CM | POA: Diagnosis not present

## 2024-02-28 DIAGNOSIS — M79605 Pain in left leg: Secondary | ICD-10-CM

## 2024-02-28 DIAGNOSIS — R200 Anesthesia of skin: Secondary | ICD-10-CM | POA: Diagnosis not present

## 2024-02-28 DIAGNOSIS — R7309 Other abnormal glucose: Secondary | ICD-10-CM | POA: Diagnosis not present

## 2024-02-28 DIAGNOSIS — G629 Polyneuropathy, unspecified: Secondary | ICD-10-CM | POA: Diagnosis not present

## 2024-02-28 DIAGNOSIS — M25562 Pain in left knee: Secondary | ICD-10-CM | POA: Diagnosis not present

## 2024-02-28 LAB — BAYER DCA HB A1C WAIVED: HB A1C (BAYER DCA - WAIVED): 6.4 % — ABNORMAL HIGH (ref 4.8–5.6)

## 2024-02-28 MED ORDER — GABAPENTIN 300 MG PO CAPS: 600.0000 mg | ORAL_CAPSULE | Freq: Two times a day (BID) | ORAL | 1 refills | Status: DC

## 2024-02-28 NOTE — Progress Notes (Signed)
 Subjective:  Patient ID: Erica Beasley, female    DOB: 09-13-1956  Age: 68 y.o. MRN: [id number]  CC: Medical Management of Chronic Issues ( ) and Leg Pain (Left knee and lower leg hurts now for two months. Can't straighten leg. Having to use walker. Worse when she first wakes up. No swelling. )   HPI Erica Beasley presents for left knee, lower leg pain. A pulling sensation noted. Has been building over the last 2 months and is now making walking very difficult and painful. Pain is at the mid leg laterally. Pain level 8/10 most days.. this in spite of using Lodine, prednisone  and gabapentin . Seeing rheumatology on 6/16  Glucose running 84 in the mornings. Taking metformin  BID. No lows.      11/27/2023    2:25 PM 08/29/2023    3:31 PM 07/24/2023   10:16 AM  Depression screen PHQ 2/9  Decreased Interest 0 0 0  Down, Depressed, Hopeless 0 0 0  PHQ - 2 Score 0 0 0  Altered sleeping 0  0  Tired, decreased energy 0  3  Change in appetite 0  0  Feeling bad or failure about yourself  0  0  Trouble concentrating 0  0  Moving slowly or fidgety/restless 0  0  Suicidal thoughts 0  0  PHQ-9 Score 0  3  Difficult doing work/chores Not difficult at all  Not difficult at all    History Erica Beasley has a past medical history of Aortic atherosclerosis (HCC) (11/13/2020), CKD (chronic kidney disease) stage 2, GFR 60-89 ml/min, COPD (chronic obstructive pulmonary disease) (HCC), COVID-19 (06/2019), Depression, Essential hypertension, GERD (gastroesophageal reflux disease), H/O Clostridium difficile infection (05/03/2019), History of diabetes mellitus, colonic polyps (05/03/2019), Hyperlipidemia, Hypothyroidism, Neuropathy, Rheumatoid arthritis (HCC), 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   She has a past surgical history that includes Yag laser application (Left, 07/27/2015); Colonoscopy (09/2010); Colonoscopy with propofol  (N/A,  09/16/2019); biopsy (09/16/2019); Esophagogastroduodenoscopy (egd) with propofol  (N/A, 04/28/2020); biopsy (04/28/2020); Cataract extraction (Bilateral); Colonoscopy with propofol  (N/A, 06/08/2020); biopsy (06/08/2020); and Givens capsule study (N/A, 08/12/2022).   Her family history includes Breast cancer in her mother; Diabetes in her mother; Heart disease in her mother; Migraines in her daughter; Other in her father; Stroke in her mother.She reports that she quit smoking about 6 years ago. Her smoking use included cigarettes. She started smoking about 21 years ago. She has a 15 pack-year smoking history. She has quit using smokeless tobacco. She reports that she does not drink alcohol and does not use drugs.    ROS Review of Systems  Constitutional: Negative.   HENT: Negative.    Eyes:  Negative for visual disturbance.  Respiratory:  Negative for shortness of breath.   Cardiovascular:  Negative for chest pain.  Gastrointestinal:  Negative for abdominal pain.  Musculoskeletal:  Positive for arthralgias.    Objective:  BP 122/74   Pulse 97   Temp 98.2 F (36.8 C)   Ht 5\' 4"  (1.626 m)   Wt 101 lb (45.8 kg)   SpO2 99%   BMI 17.34 kg/m   BP Readings from Last 3 Encounters:  02/28/24 122/74  02/26/24 (!) 142/65  02/13/24 128/78    Wt Readings from Last 3 Encounters:  02/28/24 101 lb (45.8 kg)  02/13/24 103 lb (46.7 kg)  01/29/24 106 lb 7.7 oz (48.3 kg)     Physical Exam Constitutional:      General: She  is not in acute distress.    Appearance: She is well-developed.  Cardiovascular:     Rate and Rhythm: Normal rate and regular rhythm.  Pulmonary:     Breath sounds: Normal breath sounds.  Musculoskeletal:        General: Normal range of motion.  Skin:    General: Skin is warm and dry.  Neurological:     Mental Status: She is alert and oriented to person, place, and time.    Results for orders placed or performed in visit on 02/28/24  Bayer DCA Hb A1c Waived    Collection Time: 02/28/24  2:08 PM  Result Value Ref Range   HB A1C (BAYER DCA - WAIVED) 6.4 (H) 4.8 - 5.6 %     Assessment & Plan:  Pain of left lower extremity -     DG Knee 1-2 Views Left; Future -     DG Tibia/Fibula Left; Future -     CBC with Differential/Platelet -     CMP14+EGFR -     Bayer DCA Hb A1c Waived  Neuropathy -     CBC with Differential/Platelet -     CMP14+EGFR -     Bayer DCA Hb A1c Waived -     Gabapentin ; Take 2 capsules (600 mg total) by mouth 2 (two) times daily.  Dispense: 360 capsule; Refill: 1  Diabetic lipidosis (HCC)  Continue DM meds as is. Gaba increased.    Follow-up: Return in about 3 months (around 05/30/2024), or if symptoms worsen or fail to improve.  Erica Beasley, M.D.

## 2024-02-29 LAB — CMP14+EGFR
ALT: 10 IU/L (ref 0–32)
AST: 16 IU/L (ref 0–40)
Albumin: 3.5 g/dL — ABNORMAL LOW (ref 3.9–4.9)
Alkaline Phosphatase: 97 IU/L (ref 44–121)
BUN/Creatinine Ratio: 14 (ref 12–28)
BUN: 13 mg/dL (ref 8–27)
Bilirubin Total: 0.2 mg/dL (ref 0.0–1.2)
CO2: 18 mmol/L — ABNORMAL LOW (ref 20–29)
Calcium: 8.3 mg/dL — ABNORMAL LOW (ref 8.7–10.3)
Chloride: 103 mmol/L (ref 96–106)
Creatinine, Ser: 0.92 mg/dL (ref 0.57–1.00)
Globulin, Total: 2.1 g/dL (ref 1.5–4.5)
Glucose: 198 mg/dL — ABNORMAL HIGH (ref 70–99)
Potassium: 3.5 mmol/L (ref 3.5–5.2)
Sodium: 137 mmol/L (ref 134–144)
Total Protein: 5.6 g/dL — ABNORMAL LOW (ref 6.0–8.5)
eGFR: 68 mL/min/{1.73_m2} (ref >59)

## 2024-02-29 LAB — CBC WITH DIFFERENTIAL/PLATELET
Basophils Absolute: 0 10*3/uL (ref 0.0–0.2)
Basos: 0 %
EOS (ABSOLUTE): 0 10*3/uL (ref 0.0–0.4)
Eos: 0 %
Hematocrit: 31.8 % — ABNORMAL LOW (ref 34.0–46.6)
Hemoglobin: 10.1 g/dL — ABNORMAL LOW (ref 11.1–15.9)
Immature Grans (Abs): 0 10*3/uL (ref 0.0–0.1)
Immature Granulocytes: 0 %
Lymphocytes Absolute: 1.5 10*3/uL (ref 0.7–3.1)
Lymphs: 26 %
MCH: 28.1 pg (ref 26.6–33.0)
MCHC: 31.8 g/dL (ref 31.5–35.7)
MCV: 89 fL (ref 79–97)
Monocytes Absolute: 0.6 10*3/uL (ref 0.1–0.9)
Monocytes: 10 %
Neutrophils Absolute: 3.6 10*3/uL (ref 1.4–7.0)
Neutrophils: 64 %
Platelets: 261 10*3/uL (ref 150–450)
RBC: 3.59 x10E6/uL — ABNORMAL LOW (ref 3.77–5.28)
RDW: 13.1 % (ref 11.7–15.4)
WBC: 5.7 10*3/uL (ref 3.4–10.8)

## 2024-02-29 NOTE — Progress Notes (Signed)
Hello Taja,  Your lab result is normal and/or stable.Some minor variations that are not significant are commonly marked abnormal, but do not represent any medical problem for you.  Best regards, Loula Marcella, M.D.

## 2024-03-07 DIAGNOSIS — M069 Rheumatoid arthritis, unspecified: Secondary | ICD-10-CM | POA: Diagnosis not present

## 2024-03-07 DIAGNOSIS — Z79899 Other long term (current) drug therapy: Secondary | ICD-10-CM | POA: Diagnosis not present

## 2024-03-07 DIAGNOSIS — H40051 Ocular hypertension, right eye: Secondary | ICD-10-CM | POA: Diagnosis not present

## 2024-03-07 DIAGNOSIS — H401330 Pigmentary glaucoma, bilateral, stage unspecified: Secondary | ICD-10-CM | POA: Diagnosis not present

## 2024-03-07 DIAGNOSIS — Z961 Presence of intraocular lens: Secondary | ICD-10-CM | POA: Diagnosis not present

## 2024-03-11 DIAGNOSIS — L409 Psoriasis, unspecified: Secondary | ICD-10-CM | POA: Diagnosis not present

## 2024-03-11 DIAGNOSIS — M79676 Pain in unspecified toe(s): Secondary | ICD-10-CM | POA: Diagnosis not present

## 2024-03-11 DIAGNOSIS — R5382 Chronic fatigue, unspecified: Secondary | ICD-10-CM | POA: Diagnosis not present

## 2024-03-11 DIAGNOSIS — Z79899 Other long term (current) drug therapy: Secondary | ICD-10-CM | POA: Diagnosis not present

## 2024-03-11 DIAGNOSIS — M1991 Primary osteoarthritis, unspecified site: Secondary | ICD-10-CM | POA: Diagnosis not present

## 2024-03-11 DIAGNOSIS — M25562 Pain in left knee: Secondary | ICD-10-CM | POA: Diagnosis not present

## 2024-03-11 DIAGNOSIS — L4059 Other psoriatic arthropathy: Secondary | ICD-10-CM | POA: Diagnosis not present

## 2024-03-11 DIAGNOSIS — M0579 Rheumatoid arthritis with rheumatoid factor of multiple sites without organ or systems involvement: Secondary | ICD-10-CM | POA: Diagnosis not present

## 2024-03-11 DIAGNOSIS — Z681 Body mass index (BMI) 19 or less, adult: Secondary | ICD-10-CM | POA: Diagnosis not present

## 2024-03-11 DIAGNOSIS — M25561 Pain in right knee: Secondary | ICD-10-CM | POA: Diagnosis not present

## 2024-03-11 DIAGNOSIS — D649 Anemia, unspecified: Secondary | ICD-10-CM | POA: Diagnosis not present

## 2024-03-18 ENCOUNTER — Other Ambulatory Visit: Payer: Self-pay | Admitting: Hematology

## 2024-03-19 ENCOUNTER — Encounter: Payer: Self-pay | Admitting: Hematology

## 2024-03-19 NOTE — Progress Notes (Signed)
 Lab no charges.

## 2024-03-26 NOTE — Progress Notes (Unsigned)
 [facility] [address], [place] [address]   CLINIC:  Medical Oncology/Hematology  PCP:  Zollie Lowers, MD [address] [place] [address] [phone]   REASON FOR VISIT:  Follow-up for normocytic anemia  INTERVAL HISTORY:   Erica Beasley 68 y.o. female returns for routine follow-up of her normocytic anemia.  She was last seen by Pleasant Barefoot PA-C on 01/29/2024.   ***She has not required any IV iron or blood transfusion since her last visit. ***She was started on Retacrit  after her last visit on 01/29/2024. She is tolerating Retacrit  well without any major side effects.  Blood pressure has been ***.  No symptoms concerning for DVT or PE.  *** *** She has baseline fatigue. *** She  denies any bleeding episodes.  No pica, headaches, chest pain, lightheadedness, syncope, or dyspnea on exertion.    ***No B symptoms such as fever, chills, night sweats, unintentional weight loss. She reports little to no*** energy, with 75***% appetite.  ***She continues to have ongoing joint pain from her rheumatoid arthritis. ***Her last Rituxan was on 01/08/24 and 01/22/24.*** She continues to take hydroxychloroquine .   ***Her weight is improved, with BMI up to 18.28.***  ASSESSMENT & PLAN:  1.  NORMOCYTIC ANEMIA -  BMBX on 07/19/2021: Mildly hypercellular marrow with trilineage hematopoiesis.  No increased ring sideroblasts or blasts.  3% polytypic plasma cells.  No large aggregates of plasma cells.  Iron stores increased.  Chromosome analysis normal. - Etiology of patient's anemia remains multifactorial.   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 Myelosuppression from rheumatology medications.  Leflunomide  and sulfasalazine  discontinued due to anemia, with significant improvement in blood counts.  Remains on hydroxychloroquine  and started on Rituxan as of September 2024. Patient has been NEGATIVE for Hemoccult  stool on multiple occasions within the past year.  No obvious rectal bleeding or melena.  EGD, colonoscopy, capsule studies did not reveal any source of major blood loss. Bone marrow biopsy was nondiagnostic.  Hemolysis labs and nutritional deficiency work-up have been unremarkable, apart from significant functional iron deficiency (no improvement in hemoglobin after IV iron) Evidence of emerging CKD started in January 2025 - She is taking vitamin B12 tablets (500 mcg EOD) at home.*** - Trial of monthly Feraheme x2 doses did NOT improve her hemoglobin.  Iron tablets discontinued due to elevated iron levels. - She has required intermittent blood transfusions, most recently on 12/30/2022 (Hgb 7.0)*** - Leflunomide  was discontinued by rheumatology on 01/18/2023.  Patient's blood count improved significantly after leflunomide  was stopped. - Started on rituximab  (1000 mg IV every 2 weeks x 2 doses, then every 4 months) by her rheumatologist, first dose on 06/07/2023. Prior to starting Rituxan her anemia had resolved with Hgb 12.1 (04/14/2023) and Hgb 11.3 (05/24/2023).  After starting Rituxan, she had a drop in hemoglobin to 10.5 on 06/21/2023, and Hgb 8.7 as of 06/28/2023.  Blood count gradually recovered after Rituxan.    Most recent dose of Rituxan on 01/08/2024 and 01/22/2024  Blood counts have been stable after Rituxan.  Seems to be doing better on maintenance dose than she did on loading dose. Next dose of Rituxan will be due in around 04/30/2024*** NOTE: Discussed with Dr. Rogers on 08/02/2023.  He agrees likely culprit is  anemia of chronic disease worsened by myelosuppression from Rituxan.  - Workup of recurrent anemia (September/October 2024) No nutritional deficiencies (ferritin 1267, iron saturation 46%, low TIBC 195.  Normal B12/MMA.) No evidence of Rituxan induced hemolysis (DAT negative, normal haptoglobin, normal LDH, normal bilirubin) Reticulocytes elevated to 3.7%, which could indicate  compensation for recent blood loss or signs of bone marrow recovery following suppression from Rituxan - Started on Retacrit  10,000 units monthly as of May 2025.  *** - Labs today (03/27/2024):*** Ferritin *** iron saturation ***%, low TIBC *** Creatinine ***/GFR ***.   (Previously normal B12, folate, MMA, LDH as of January 2025) - CBC today (01/29/2024): Hgb 9.8/MCV 92.5.- She has baseline fatigue*** - No bright red blood per rectum or melena*** - PLAN: *** TBD.  *** Due to Hgb <10.0 in the setting of CKD, we will start her on RETACRIT  10,000 units every 4 weeks to maintain Hgb >10. - Continue nephrology follow-up. - RTC 2*** months with same-day labs and injection - Continue vitamin B12 500 mcg every other day.  (Recheck B12/MMA at next follow-up)  2.  Bence-Jones proteinuria + MGUS - 24-hour urine done by Dr. Rachele showed urine immunofixation positive for Bence-Jones proteinuria, kappa type.  24-hour total protein was 77 mg/day. - Free kappa light chains were 52.9, lambda light chains 29.8 with ratio of 1.78.  Serum immunofixation was unremarkable.  SPEP did not show any evidence of M spike. - Labs on 04/03/2020 shows negative immunofixation.  Kappa light chain 61.3, lambda light chains 32.3, ratio 1.9. - Skeletal survey on 01/29/2024 was negative for lytic lesions. - Most recent MGUS/myeloma panel (01/22/2024) 24-hour urine/UPEP/UIFE/TP: Total protein 629 mg/24-hr.  Urine immunofixation normal, M spike not observed.  Mildly elevated urine kappa light chains 116, mildly elevated lambda 34.8, normal urine FLC ratio 3.34. Serum immunofixation = IgG kappa monoclonal protein (previously not detected) SPEP with M spike 0.1 Mildly elevated serum kappa free light chain 35.9, normal lambda 23.5, normal ratio 1.53 Hgb 9.8, creatinine 1.26, calcium  8.7 - No new onset bone pain or neurologic changes  - PLAN: Continue monitoring of MGUS/Bence-Jones proteinuria with repeat labs at next visit (October 2025),  urine study annually (April 2026), and skeletal survey (May 2026) annually.  3.  Failure to thrive with low BMI*** - Intermittent episodes of poor appetite and weight loss associated with painful flareups of her RA - Weight has been fluctuating from 90 to 100 pounds over the past year, with BMI around 17.  As of today's visit (01/29/2024), her weight has improved at 160 pounds, with BMI up to 18.28. - She has been evaluated by nutritionist (phone note 01/30/2023) - EGD on 04/28/2020 with stomach and duodenal biopsy negative. - CT CAP on 05/11/2020 showed long segment colonic thickening likely diverticular disease.  Stable small juxtapleural nodule in the right upper chest benign over 1 year.  Moderate large fat-containing left inguinal hernia.  Emphysema and diverticular calcification. - Colonoscopy on 06/08/2020 shows pancolonic diverticulosis, mild.  Normal-appearing colonic mucosa otherwise.  Random biopsies were negative.  4.  Rheumatoid arthritis - Follows with [facility]umatology Erica Be[place], [place]) - Leflunomide  discontinued in June 2024 due to anemia - Rituxan infusions started on 06/07/2023 - She continues to take hydroxychloroquine   5.  Osteoporosis - DEXA scan on 01/05/2022 with T score -2.7. - Recommended Prolia but her insurance did not cover it. - She started on Zometa  on 02/23/2022.  Most recent dose of Zometa  on 02/26/2024. - Normal vitamin D  (10/24/2022) at 38.1 - PLAN: Continue annual Zometa  for total duration of 3 to 5 years.  Next Zometa  due around 02/26/2025.  Continue calcium  supplements. - Check calcium  and Vitamin D   with next appotinment  PLAN SUMMARY: *** TBD.  *** >> Monthly CBC/BB sample + RETACRIT  INJECTION (first dose today) >> SKELETAL SURVEY  >> Same-day labs (CBC/D, CMP, ferritin, iron/TIBC) + OFFICE visit + INJECTION in 2 months    REVIEW OF SYSTEMS:***  Review of Systems  Constitutional:  Positive for fatigue. Negative for appetite change, chills,  diaphoresis, fever and unexpected weight change.  HENT:   Negative for lump/mass, nosebleeds and trouble swallowing.   Eyes:  Negative for eye problems.  Respiratory:  Negative for cough, hemoptysis and shortness of breath.   Cardiovascular:  Negative for chest pain, leg swelling and palpitations.  Gastrointestinal:  Negative for abdominal pain, blood in stool, constipation, diarrhea, nausea and vomiting.  Genitourinary:  Negative for hematuria.   Musculoskeletal:  Positive for arthralgias and back pain.  Skin: Negative.   Neurological:  Negative for dizziness, extremity weakness (Residual deficit from CVA), headaches and light-headedness.  Hematological:  Does not bruise/bleed easily.  Psychiatric/Behavioral:  Negative for depression. The patient is not nervous/anxious.      PHYSICAL EXAM:  ECOG PERFORMANCE STATUS: 1 - Symptomatic but completely ambulatory *** There were no vitals filed for this visit.   Physical Exam Vitals reviewed.  Constitutional:      Appearance: Normal appearance. She is underweight.  Cardiovascular:     Rate and Rhythm: Normal rate and regular rhythm.     Pulses: Normal pulses.     Heart sounds: Normal heart sounds.  Pulmonary:     Effort: Pulmonary effort is normal.     Breath sounds: Normal breath sounds.  Neurological:     Mental Status: She is alert and oriented to person, place, and time. Mental status is at baseline.  Psychiatric:        Mood and Affect: Mood normal.        Behavior: Behavior normal.    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 Tobie: mild diverticulsis in sigmoid colon.    COLONOSCOPY WITH PROPOFOL  N/A 09/16/2019   Rourk: Diverticulosis, random colon biopsies negative for microscopic colitis.   COLONOSCOPY WITH PROPOFOL  N/A 06/08/2020   Procedure: COLONOSCOPY WITH PROPOFOL ;  Surgeon: Shaaron Lamar HERO, MD;  [facility];  Service: Endoscopy;  Laterality: N/A;  9:15am   ESOPHAGOGASTRODUODENOSCOPY (EGD) WITH PROPOFOL  N/A 04/28/2020  Procedure: ESOPHAGOGASTRODUODENOSCOPY (EGD) WITH PROPOFOL ;  Surgeon: Shaaron Lamar HERO, MD;  [facility];  Service: Endoscopy;  Laterality: N/A;  2:15   GIVENS CAPSULE STUDY N/A 08/12/2022   Procedure: GIVENS CAPSULE STUDY;  Surgeon: Shaaron Lamar HERO, MD;  [facility];  Service: Endoscopy;  Laterality: N/A;   YAG LASER APPLICATION Left 07/27/2015   Procedure: YAG LASER APPLICATION;  Surgeon: Dow JULIANNA Burke, MD;  Location: [facility];  Service: Ophthalmology;  Laterality: Left;    SOCIAL HISTORY:  Social History   Socioeconomic History   Marital status: Widowed    Spouse name: Not on file   Number of children: 1   Years of education: Not on file   Highest education level: 12th grade  Occupational History   Occupation: DISABLED  Tobacco Use   Smoking status: Former    Current packs/day: 0.00     Average packs/day: 1 pack/day for 15.0 years (15.0 ttl pk-yrs)    Types: Cigarettes    Start date: 09/11/2002    Quit date: 09/11/2017    Years since quitting: 6.5   Smokeless tobacco: Former  Building services engineer status: Never Used  Substance and Sexual Activity   Alcohol use: Never   Drug use: Never   Sexual activity: Not Currently  Other Topics Concern   Not on file  Social History Narrative   Disabled, widow, lives with daughter and her family. Patient still drives.    Social Drivers of Corporate investment banker Strain: Low Risk  (12/11/2023)   Overall Financial Resource Strain (CARDIA)    Difficulty of Paying Living Expenses: Not hard at all  Food Insecurity: No Food Insecurity (12/11/2023)   Hunger Vital Sign    Worried About Running Out of Food in the Last Year: Never true    Ran Out of Food in the Last Year: Never true  Transportation Needs: No Transportation Needs (12/11/2023)   PRAPARE - Administrator, Civil Service (Medical): No    Lack of Transportation (Non-Medical): No  Physical Activity: Unknown (12/11/2023)   Exercise Vital Sign    Days of Exercise per Week: 0 days    Minutes of Exercise per Session: Not on file  Recent Concern: Physical Activity - Inactive (12/11/2023)   Exercise Vital Sign    Days of Exercise per Week: 0 days    Minutes of Exercise per Session: 30 min  Stress: No Stress Concern Present (12/11/2023)   [employer] of Occupational Health - Occupational Stress Questionnaire    Feeling of Stress : Only a little  Social Connections: Socially Isolated (12/11/2023)   Social Connection and Isolation Panel    Frequency of Communication with Friends and Family: More than three times a week    Frequency of Social Gatherings with Friends and Family: Twice a week    Attends Religious Services: Never    Database administrator or Organizations: No    Attends Engineer, structural: Not on file    Marital Status: Widowed   Intimate Partner Violence: Not At Risk (12/13/2022)   Humiliation, Afraid, Rape, and Kick questionnaire    Fear of Current or Ex-Partner: No    Emotionally Abused: No    Physically Abused: No    Sexually Abused: No    FAMILY HISTORY:  Family History  Problem Relation Age of Onset   Stroke Mother    Heart disease Mother    Diabetes Mother    Breast cancer Mother  Other Father        stomach taken out for some reason   Migraines Daughter    Colon cancer Neg Hx     CURRENT MEDICATIONS:  Outpatient Encounter Medications as of 03/27/2024  Medication Sig   albuterol  (VENTOLIN  HFA) 108 (90 Base) MCG/ACT inhaler Inhale 2 puffs into the lungs every 6 (six) hours as needed for wheezing or shortness of breath.   aspirin  EC 81 MG tablet Take 1 tablet (81 mg total) by mouth daily. Swallow whole.   Blood Glucose Monitoring Suppl DEVI 1 each by Does not apply route in the morning, at noon, and at bedtime. May substitute to any manufacturer covered by patient's insurance.   calcium  carbonate (OSCAL) 1500 (600 Ca) MG TABS tablet Take 1,500 mg by mouth 2 (two) times daily with a meal.   colchicine 0.6 MG tablet Take 0.6 mg by mouth daily.   Cyanocobalamin  (B-12) 500 MCG TABS Take 500 mcg by mouth every other day.   desvenlafaxine  (PRISTIQ ) 50 MG 24 hr tablet Take 1 tablet (50 mg total) by mouth daily.   etodolac (LODINE) 300 MG capsule Take 300 mg by mouth 2 (two) times daily.   folic acid  (FOLVITE ) 1 MG tablet TAKE ONE TABLET ONCE DAILY   gabapentin  (NEURONTIN ) 300 MG capsule Take 2 capsules (600 mg total) by mouth 2 (two) times daily.   glucose blood (ACCU-CHEK GUIDE TEST) test strip Check BS twice daily before meals Dx R73.9   hydroxychloroquine  (PLAQUENIL ) 200 MG tablet Take 200 mg by mouth 2 (two) times daily.   levothyroxine  (SYNTHROID ) 125 MCG tablet Take 1 tablet (125 mcg total) by mouth every morning.   liothyronine  (CYTOMEL ) 5 MCG tablet Take 2 tablets (10 mcg total) by mouth daily.    megestrol  (MEGACE ) 40 MG/ML suspension TAKE 10 ML BY MOUTH TWICE DAILY   metFORMIN  (GLUCOPHAGE -XR) 750 MG 24 hr tablet Take 1 tablet (750 mg total) by mouth 2 (two) times daily with a meal.   potassium chloride  SA (KLOR-CON  M) 20 MEQ tablet TAKE ONE TABLET ONCE DAILY   predniSONE  (DELTASONE ) 5 MG tablet Take 5-10 mg by mouth daily.   rosuvastatin  (CRESTOR ) 40 MG tablet Take 1 tablet (40 mg total) by mouth daily.   traZODone  (DESYREL ) 100 MG tablet Take 1 tablet (100 mg total) by mouth at bedtime as needed. for sleep   No facility-administered encounter medications on file as of 03/27/2024.    ALLERGIES:  Allergies  Allergen Reactions   Adalimumab     Other reaction(s): no response Pt is unaware of this Other reaction(s): no response   Methotrexate Other (See Comments)    Other reaction(s): GI side effects   Sulfasalazine      Other Reaction(s): Anemia   Upadacitinib Other (See Comments)    Other reaction(s): GI side effects    LABORATORY DATA:  I have reviewed the labs as listed.  CBC    Component Value Date/Time   WBC 5.7 02/28/2024 1410   WBC 7.2 02/26/2024 1013   RBC 3.59 (L) 02/28/2024 1410   RBC 3.66 (L) 02/26/2024 1013   HGB 10.1 (L) 02/28/2024 1410   HCT 31.8 (L) 02/28/2024 1410   PLT 261 02/28/2024 1410   MCV 89 02/28/2024 1410   MCH 28.1 02/28/2024 1410   MCH 28.7 02/26/2024 1013   MCHC 31.8 02/28/2024 1410   MCHC 30.6 02/26/2024 1013   RDW 13.1 02/28/2024 1410   LYMPHSABS 1.5 02/28/2024 1410   MONOABS 0.5 01/29/2024 1026  EOSABS 0.0 02/28/2024 1410   BASOSABS 0.0 02/28/2024 1410      Latest Ref Rng & Units 02/28/2024    2:10 PM 02/26/2024   10:13 AM 01/22/2024    2:43 PM  CMP  Glucose 70 - 99 mg/dL 801  778  579   BUN 8 - 27 mg/dL 13  23  18    Creatinine 0.57 - 1.00 mg/dL 9.07  9.04  8.73   Sodium 134 - 144 mmol/L 137  137  131   Potassium 3.5 - 5.2 mmol/L 3.5  3.7  4.1   Chloride 96 - 106 mmol/L 103  105  98   CO2 20 - 29 mmol/L 18  20  21    Calcium   8.7 - 10.3 mg/dL 8.3  9.3  8.7   Total Protein 6.0 - 8.5 g/dL 5.6  6.3  6.1   Total Bilirubin 0.0 - 1.2 mg/dL 0.2  0.3  0.3   Alkaline Phos 44 - 121 IU/L 97  80  80   AST 0 - 40 IU/L 16  17  20    ALT 0 - 32 IU/L 10  11  11      DIAGNOSTIC IMAGING:  I have independently reviewed the relevant imaging and discussed with the patient.   WRAP UP:  All questions were answered. The patient knows to call the clinic with any problems, questions or concerns.  Medical decision making: Moderate  Time spent on visit: I spent 20 minutes counseling the patient face to face. The total time spent in the appointment was 30 minutes and more than 50% was on counseling.  Pleasant CHRISTELLA Barefoot, PA-C  ***

## 2024-03-27 ENCOUNTER — Inpatient Hospital Stay

## 2024-03-27 ENCOUNTER — Inpatient Hospital Stay: Attending: Hematology | Admitting: Physician Assistant

## 2024-03-27 VITALS — BP 130/71 | HR 75 | Temp 98.2°F | Resp 16 | Wt 103.4 lb

## 2024-03-27 DIAGNOSIS — R627 Adult failure to thrive: Secondary | ICD-10-CM | POA: Diagnosis not present

## 2024-03-27 DIAGNOSIS — N1831 Chronic kidney disease, stage 3a: Secondary | ICD-10-CM | POA: Diagnosis not present

## 2024-03-27 DIAGNOSIS — R803 Bence Jones proteinuria: Secondary | ICD-10-CM | POA: Insufficient documentation

## 2024-03-27 DIAGNOSIS — D500 Iron deficiency anemia secondary to blood loss (chronic): Secondary | ICD-10-CM | POA: Diagnosis not present

## 2024-03-27 DIAGNOSIS — D638 Anemia in other chronic diseases classified elsewhere: Secondary | ICD-10-CM | POA: Insufficient documentation

## 2024-03-27 DIAGNOSIS — D472 Monoclonal gammopathy: Secondary | ICD-10-CM | POA: Insufficient documentation

## 2024-03-27 DIAGNOSIS — D631 Anemia in chronic kidney disease: Secondary | ICD-10-CM

## 2024-03-27 DIAGNOSIS — M069 Rheumatoid arthritis, unspecified: Secondary | ICD-10-CM | POA: Insufficient documentation

## 2024-03-27 DIAGNOSIS — E538 Deficiency of other specified B group vitamins: Secondary | ICD-10-CM

## 2024-03-27 DIAGNOSIS — Z87891 Personal history of nicotine dependence: Secondary | ICD-10-CM | POA: Insufficient documentation

## 2024-03-27 LAB — IRON AND TIBC
Iron: 54 ug/dL (ref 28–170)
Saturation Ratios: 21 % (ref 10.4–31.8)
TIBC: 252 ug/dL (ref 250–450)
UIBC: 198 ug/dL

## 2024-03-27 LAB — COMPREHENSIVE METABOLIC PANEL WITH GFR
ALT: 14 U/L (ref 0–44)
AST: 17 U/L (ref 15–41)
Albumin: 3.1 g/dL — ABNORMAL LOW (ref 3.5–5.0)
Alkaline Phosphatase: 80 U/L (ref 38–126)
Anion gap: 12 (ref 5–15)
BUN: 16 mg/dL (ref 8–23)
CO2: 19 mmol/L — ABNORMAL LOW (ref 22–32)
Calcium: 9.4 mg/dL (ref 8.9–10.3)
Chloride: 104 mmol/L (ref 98–111)
Creatinine, Ser: 0.84 mg/dL (ref 0.44–1.00)
GFR, Estimated: >60 mL/min (ref >60)
Glucose, Bld: 249 mg/dL — ABNORMAL HIGH (ref 70–99)
Potassium: 4.7 mmol/L (ref 3.5–5.1)
Sodium: 135 mmol/L (ref 135–145)
Total Bilirubin: 0.3 mg/dL (ref 0.0–1.2)
Total Protein: 6.4 g/dL — ABNORMAL LOW (ref 6.5–8.1)

## 2024-03-27 LAB — CBC WITH DIFFERENTIAL/PLATELET
Abs Immature Granulocytes: 0.07 10*3/uL (ref 0.00–0.07)
Basophils Absolute: 0 10*3/uL (ref 0.0–0.1)
Basophils Relative: 0 %
Eosinophils Absolute: 0 10*3/uL (ref 0.0–0.5)
Eosinophils Relative: 0 %
HCT: 33.8 % — ABNORMAL LOW (ref 36.0–46.0)
Hemoglobin: 10.3 g/dL — ABNORMAL LOW (ref 12.0–15.0)
Immature Granulocytes: 1 %
Lymphocytes Relative: 17 %
Lymphs Abs: 1.2 10*3/uL (ref 0.7–4.0)
MCH: 29.1 pg (ref 26.0–34.0)
MCHC: 30.5 g/dL (ref 30.0–36.0)
MCV: 95.5 fL (ref 80.0–100.0)
Monocytes Absolute: 0.3 10*3/uL (ref 0.1–1.0)
Monocytes Relative: 5 %
Neutro Abs: 5.1 10*3/uL (ref 1.7–7.7)
Neutrophils Relative %: 77 %
Platelets: 219 10*3/uL (ref 150–400)
RBC: 3.54 MIL/uL — ABNORMAL LOW (ref 3.87–5.11)
RDW: 14.6 % (ref 11.5–15.5)
WBC: 6.7 10*3/uL (ref 4.0–10.5)
nRBC: 0.4 % — ABNORMAL HIGH (ref 0.0–0.2)

## 2024-03-27 LAB — FERRITIN: Ferritin: 797 ng/mL — ABNORMAL HIGH (ref 11–307)

## 2024-03-27 NOTE — Progress Notes (Signed)
 No retacrit  needed today per Pleasant Barefoot PA.

## 2024-03-27 NOTE — Patient Instructions (Signed)
 [facility] **VISIT SUMMARY & IMPORTANT INSTRUCTIONS **   You were seen today by Pleasant Barefoot PA-C for your anemia.    ANEMIA: Your blood counts had gotten better after your leflunomide  was stopped.  We believe that Rituxan has also been contributing to your anemia, although you have some chronic anemia related to chronic inflammation. We will continue to check blood counts once every 6 weeks.  ANOTHER REASON FOR YOUR ANEMIA IS YOUR CHRONIC KIDNEY DISEASE...    We will give you RETACRIT  INJECTIONS every 6 weeks as needed (if Hgb < 10) to help your body make more healthy blood cells. Continue taking vitamin B12 500 mcg every other day. I will see you for FOLLOW UP in 3 months to make sure that you are doing well on these injections.  MGUS (monoclonal gammopathy of undetermined significance) As we discussed, you have a slightly increased amount of abnormal protein in your urine, which can be a sign of a precancerous condition. We will continue to check these labs every 6 months.  ** Please continue to work on eating plenty of calories, even when you are in pain.  It is important that you do not lose any more weight.  ** Thank you for trusting me with your healthcare!  I strive to provide all of my patients with quality care at each visit.  If you receive a survey for this visit, I would be so grateful to you for taking the time to provide feedback.  Thank you in advance!  ~ Denna Fryberger                   Dr. Alean Stands   &   Pleasant Barefoot, PA-C   - - - - - - - - - - - - - - - - - -    Thank you for choosing [facility] at [facility] to provide your oncology and hematology care.  To afford each patient quality time with our provider, please arrive at least 15 minutes before your scheduled appointment time.   If you have a lab appointment with the Cancer Center please come in thru the Main Entrance and check in at  the main information desk.  You need to re-schedule your appointment should you arrive 10 or more minutes late.  We strive to give you quality time with our providers, and arriving late affects you and other patients whose appointments are after yours.  Also, if you no show three or more times for appointments you may be dismissed from the clinic at the providers discretion.     Again, thank you for choosing [facility].  Our hope is that these requests will decrease the amount of time that you wait before being seen by our physicians.       _____________________________________________________________  Should you have questions after your visit to [facility], please contact our office at [phone] and follow the prompts.  Our office hours are 8:00 a.m. and 4:30 p.m. Monday - Friday.  Please note that voicemails left after 4:00 p.m. may not be returned until the following business day.  We are closed weekends and major holidays.  You do have access to a nurse 24-7, just call the main number to the clinic [phone] and do not press any options, hold on the line and a nurse will answer the phone.    For prescription refill requests, have your  pharmacy contact our office and allow 72 hours.

## 2024-04-03 DIAGNOSIS — M1712 Unilateral primary osteoarthritis, left knee: Secondary | ICD-10-CM | POA: Diagnosis not present

## 2024-04-04 ENCOUNTER — Ambulatory Visit: Admitting: Family Medicine

## 2024-04-04 ENCOUNTER — Ambulatory Visit: Payer: Self-pay

## 2024-04-04 VITALS — BP 140/73 | HR 80 | Temp 98.0°F | Ht 64.0 in | Wt 105.0 lb

## 2024-04-04 DIAGNOSIS — R300 Dysuria: Secondary | ICD-10-CM

## 2024-04-04 LAB — URINALYSIS, ROUTINE W REFLEX MICROSCOPIC
Bilirubin, UA: NEGATIVE
Ketones, UA: NEGATIVE
Nitrite, UA: POSITIVE — AB
RBC, UA: NEGATIVE
Specific Gravity, UA: 1.02 (ref 1.005–1.030)
Urobilinogen, Ur: 0.2 mg/dL (ref 0.2–1.0)
pH, UA: 6 (ref 5.0–7.5)

## 2024-04-04 LAB — MICROSCOPIC EXAMINATION

## 2024-04-04 MED ORDER — CIPROFLOXACIN HCL 250 MG PO TABS: 250.0000 mg | ORAL_TABLET | Freq: Two times a day (BID) | ORAL | 0 refills | Status: DC

## 2024-04-04 NOTE — Telephone Encounter (Signed)
 Sense of urgency w/urine   Third attempt to contact patient-voicemail left to contact Nurse Triage back. Will route to office.

## 2024-04-04 NOTE — Telephone Encounter (Signed)
 Scheduled for today. LS

## 2024-04-04 NOTE — Telephone Encounter (Signed)
 Sense of urgency w/urine   Voicemail left for patient and family to call back to Nurse Triage.  Second attempt; left vm.

## 2024-04-04 NOTE — Progress Notes (Signed)
 Subjective:  Patient ID: Erica Beasley, female    DOB: 01-06-1956  Age: 68 y.o. MRN: [id number]  CC: Urinary Tract Infection (Frequent urination and incontinence.  No pain. Started Monday. No other symptoms. )   HPI Erica Beasley presents for frequency for several days. Denies dysuria,  fever . No flank pain. No nausea, vomiting.      03/27/2024    1:21 PM 11/27/2023    2:25 PM 08/29/2023    3:31 PM  Depression screen PHQ 2/9  Decreased Interest 0 0 0  Down, Depressed, Hopeless 0 0 0  PHQ - 2 Score 0 0 0  Altered sleeping  0   Tired, decreased energy  0   Change in appetite  0   Feeling bad or failure about yourself   0   Trouble concentrating  0   Moving slowly or fidgety/restless  0   Suicidal thoughts  0   PHQ-9 Score  0   Difficult doing work/chores  Not difficult at all     History Erica Beasley has a past medical history of Aortic atherosclerosis (HCC) (11/13/2020), CKD (chronic kidney disease) stage 2, GFR 60-89 ml/min, COPD (chronic obstructive pulmonary disease) (HCC), COVID-19 (06/2019), Depression, Essential hypertension, GERD (gastroesophageal reflux disease), H/O Clostridium difficile infection (05/03/2019), History of diabetes mellitus, colonic polyps (05/03/2019), Hyperlipidemia, Hypothyroidism, Neuropathy, Rheumatoid arthritis (HCC), 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   She has a past surgical history that includes Yag laser application (Left, 07/27/2015); Colonoscopy (09/2010); Colonoscopy with propofol  (N/A, 09/16/2019); biopsy (09/16/2019); Esophagogastroduodenoscopy (egd) with propofol  (N/A, 04/28/2020); biopsy (04/28/2020); Cataract extraction (Bilateral); Colonoscopy with propofol  (N/A, 06/08/2020); biopsy (06/08/2020); and Givens capsule study (N/A, 08/12/2022).   Her family history includes Breast cancer in her mother; Diabetes in her mother; Heart disease in her mother; Migraines in her daughter;  Other in her father; Stroke in her mother.She reports that she quit smoking about 6 years ago. Her smoking use included cigarettes. She started smoking about 21 years ago. She has a 15 Beasley-year smoking history. She has quit using smokeless tobacco. She reports that she does not drink alcohol and does not use drugs.    ROS Review of Systems  Constitutional:  Negative for chills, diaphoresis and fever.  HENT:  Negative for congestion.   Eyes:  Negative for visual disturbance.  Respiratory:  Negative for cough and shortness of breath.   Cardiovascular:  Negative for chest pain and palpitations.  Gastrointestinal:  Negative for constipation, diarrhea and nausea.  Genitourinary:  Positive for dysuria, frequency and urgency. Negative for decreased urine volume, flank pain, hematuria, menstrual problem and pelvic pain.  Musculoskeletal:  Negative for arthralgias and joint swelling.  Skin:  Negative for rash.  Neurological:  Negative for dizziness and numbness.    Objective:  BP (!) 140/73   Pulse 80   Temp 98 F (36.7 C)   Ht 5' 4 (1.626 m)   Wt 105 lb (47.6 kg)   SpO2 97%   BMI 18.02 kg/m   BP Readings from Last 3 Encounters:  04/04/24 (!) 140/73  03/27/24 130/71  02/28/24 122/74    Wt Readings from Last 3 Encounters:  04/04/24 105 lb (47.6 kg)  03/27/24 103 lb 6.3 oz (46.9 kg)  02/28/24 101 lb (45.8 kg)     Physical Exam Constitutional:      Appearance: She is well-developed.  HENT:     Head: Normocephalic and atraumatic.  Cardiovascular:  Rate and Rhythm: Normal rate and regular rhythm.     Heart sounds: No murmur heard. Pulmonary:     Effort: Pulmonary effort is normal.     Breath sounds: Normal breath sounds.  Abdominal:     General: Bowel sounds are normal.     Palpations: Abdomen is soft. There is no mass.     Tenderness: There is no abdominal tenderness. There is no guarding or rebound.  Musculoskeletal:        General: No tenderness.  Skin:     General: Skin is warm and dry.  Neurological:     Mental Status: She is alert and oriented to person, place, and time.  Psychiatric:        Behavior: Behavior normal.      Assessment & Plan:  Dysuria -     Urinalysis, Routine w reflex microscopic -     Urine Culture  Other orders -     Ciprofloxacin  HCl; Take 1 tablet (250 mg total) by mouth 2 (two) times daily.  Dispense: 10 tablet; Refill: 0 -     Microscopic Examination     Follow-up: No follow-ups on file.  Butler Der, M.D.

## 2024-04-04 NOTE — Telephone Encounter (Signed)
 Sense of urgency w/urine   Voicemail left for patient and family to call back to Nurse Triage.

## 2024-04-06 ENCOUNTER — Ambulatory Visit: Payer: Self-pay | Admitting: Family Medicine

## 2024-04-07 ENCOUNTER — Encounter: Payer: Self-pay | Admitting: Family Medicine

## 2024-04-07 LAB — URINE CULTURE

## 2024-04-10 ENCOUNTER — Ambulatory Visit

## 2024-04-10 VITALS — BP 140/73 | HR 80 | Ht 64.0 in | Wt 105.0 lb

## 2024-04-10 DIAGNOSIS — Z13820 Encounter for screening for osteoporosis: Secondary | ICD-10-CM

## 2024-04-10 DIAGNOSIS — Z0000 Encounter for general adult medical examination without abnormal findings: Secondary | ICD-10-CM | POA: Diagnosis not present

## 2024-04-10 NOTE — Progress Notes (Signed)
 Subjective:   Erica Beasley is a 68 y.o. who presents for a Medicare Wellness preventive visit.  As a reminder, Annual Wellness Visits don't include a physical exam, and some assessments may be limited, especially if this visit is performed virtually. We may recommend an in-person follow-up visit with your provider if needed.  Visit Complete: Virtual I connected with  Erica Beasley on 04/10/24 by a audio enabled telemedicine application and verified that I am speaking with the correct person using two identifiers.  Patient Location: Home  Provider Location: Home Office  I discussed the limitations of evaluation and management by telemedicine. The patient expressed understanding and agreed to proceed.  Vital Signs: Because this visit was a virtual/telehealth visit, some criteria may be missing or patient reported. Any vitals not documented were not able to be obtained and vitals that have been documented are patient reported.  VideoDeclined- This patient declined Librarian, academic. Therefore the visit was completed with audio only.  Persons Participating in Visit: Patient.  AWV Questionnaire: Yes: Patient Medicare AWV questionnaire was completed by the patient on 04/09/24; I have confirmed that all information answered by patient is correct and no changes since this date.  Cardiac Risk Factors include: advanced age (>39men, >37 women);dyslipidemia     Objective:    Today's Vitals   04/10/24 1332  BP: (!) 140/73  Pulse: 80  Weight: 105 lb (47.6 kg)  Height: 5' 4 (1.626 m)   Body mass index is 18.02 kg/m.     04/10/2024    1:10 PM 03/27/2024    1:22 PM 02/26/2024    1:59 PM 01/29/2024   10:09 AM 10/16/2023   10:11 AM 08/02/2023   11:04 AM 07/05/2023    1:51 PM  Advanced Directives  Does Patient Have a Medical Advance Directive? No No No No No No No  Would patient like information on creating a medical advance directive?  No - Patient declined No -  Patient declined No - Patient declined No - Patient declined No - Patient declined No - Patient declined    Current Medications (verified) Outpatient Encounter Medications as of 04/10/2024  Medication Sig   albuterol  (VENTOLIN  HFA) 108 (90 Base) MCG/ACT inhaler Inhale 2 puffs into the lungs every 6 (six) hours as needed for wheezing or shortness of breath.   aspirin  EC 81 MG tablet Take 1 tablet (81 mg total) by mouth daily. Swallow whole.   Blood Glucose Monitoring Suppl DEVI 1 each by Does not apply route in the morning, at noon, and at bedtime. May substitute to any manufacturer covered by patient's insurance.   calcium  carbonate (OSCAL) 1500 (600 Ca) MG TABS tablet Take 1,500 mg by mouth 2 (two) times daily with a meal.   ciprofloxacin  (CIPRO ) 250 MG tablet Take 1 tablet (250 mg total) by mouth 2 (two) times daily.   colchicine 0.6 MG tablet Take 0.6 mg by mouth daily.   Cyanocobalamin  (B-12) 500 MCG TABS Take 500 mcg by mouth every other day.   desvenlafaxine  (PRISTIQ ) 50 MG 24 hr tablet Take 1 tablet (50 mg total) by mouth daily.   etodolac (LODINE) 300 MG capsule Take 300 mg by mouth 2 (two) times daily.   folic acid  (FOLVITE ) 1 MG tablet TAKE ONE TABLET ONCE DAILY   gabapentin  (NEURONTIN ) 300 MG capsule Take 2 capsules (600 mg total) by mouth 2 (two) times daily.   glucose blood (ACCU-CHEK GUIDE TEST) test strip Check BS twice daily before meals  Dx R73.9   hydroxychloroquine  (PLAQUENIL ) 200 MG tablet Take 200 mg by mouth 2 (two) times daily.   levothyroxine  (SYNTHROID ) 125 MCG tablet Take 1 tablet (125 mcg total) by mouth every morning.   liothyronine  (CYTOMEL ) 5 MCG tablet Take 2 tablets (10 mcg total) by mouth daily.   megestrol  (MEGACE ) 40 MG/ML suspension TAKE 10 ML BY MOUTH TWICE DAILY   metFORMIN  (GLUCOPHAGE -XR) 750 MG 24 hr tablet Take 1 tablet (750 mg total) by mouth 2 (two) times daily with a meal.   potassium chloride  SA (KLOR-CON  M) 20 MEQ tablet TAKE ONE TABLET ONCE DAILY    predniSONE  (DELTASONE ) 5 MG tablet Take 5-10 mg by mouth daily.   rosuvastatin  (CRESTOR ) 40 MG tablet Take 1 tablet (40 mg total) by mouth daily.   traZODone  (DESYREL ) 100 MG tablet Take 1 tablet (100 mg total) by mouth at bedtime as needed. for sleep   No facility-administered encounter medications on file as of 04/10/2024.    Allergies (verified) Adalimumab, Methotrexate, Sulfasalazine , and Upadacitinib   History: Past Medical History:  Diagnosis Date   Aortic atherosclerosis (HCC) 11/13/2020   CKD (chronic kidney disease) stage 2, GFR 60-89 ml/min    COPD (chronic obstructive pulmonary disease) (HCC)    COVID-19 06/2019   Depression    Essential hypertension    GERD (gastroesophageal reflux disease)    H/O Clostridium difficile infection 05/03/2019   History of diabetes mellitus    Hx of colonic polyps 05/03/2019   Hyperlipidemia    Hypothyroidism    Neuropathy    Rheumatoid arthritis (HCC)    Stroke (cerebrum) (HCC) 07/09/2022   Stroke (HCC) 07/08/2022   Vitamin B12 deficiency    Vitamin D  deficiency    Past Surgical History:  Procedure Laterality Date   BIOPSY  09/16/2019   Procedure: BIOPSY;  Surgeon: Shaaron Lamar HERO, MD;  [facility];  Service: Endoscopy;;   BIOPSY  04/28/2020   Procedure: BIOPSY;  Surgeon: Shaaron Lamar HERO, MD;  [facility];  Service: Endoscopy;;   BIOPSY  06/08/2020   Procedure: BIOPSY;  Surgeon: Shaaron Lamar HERO, MD;  [facility]: [facility];  Service: Endoscopy;;   CATARACT EXTRACTION Bilateral    COLONOSCOPY  09/2010   Dr. Tobie: mild diverticulsis in sigmoid colon.    COLONOSCOPY WITH PROPOFOL  N/A 09/16/2019   Rourk: Diverticulosis, random colon biopsies negative for microscopic colitis.   COLONOSCOPY WITH PROPOFOL  N/A 06/08/2020   Procedure: COLONOSCOPY WITH PROPOFOL ;  Surgeon: Shaaron Lamar HERO, MD;  [facility];  Service: Endoscopy;  Laterality: N/A;  9:15am   ESOPHAGOGASTRODUODENOSCOPY (EGD) WITH PROPOFOL  N/A  04/28/2020   Procedure: ESOPHAGOGASTRODUODENOSCOPY (EGD) WITH PROPOFOL ;  Surgeon: Shaaron Lamar HERO, MD;  [facility];  Service: Endoscopy;  Laterality: N/A;  2:15   GIVENS CAPSULE STUDY N/A 08/12/2022   Procedure: GIVENS CAPSULE STUDY;  Surgeon: Shaaron Lamar HERO, MD;  [facility];  Service: Endoscopy;  Laterality: N/A;   YAG LASER APPLICATION Left 07/27/2015   Procedure: YAG LASER APPLICATION;  Surgeon: Dow JULIANNA Burke, MD;  Location: [facility];  Service: Ophthalmology;  Laterality: Left;   Family History  Problem Relation Age of Onset   Stroke Mother    Heart disease Mother    Diabetes Mother    Breast cancer Mother    Other Father        stomach taken out for some reason   Migraines Daughter    Colon cancer Neg Hx  Social History   Socioeconomic History   Marital status: Widowed    Spouse name: Not on file   Number of children: 1   Years of education: Not on file   Highest education level: 12th grade  Occupational History   Occupation: DISABLED  Tobacco Use   Smoking status: Former    Current packs/day: 0.00    Average packs/day: 1 Beasley/day for 15.0 years (15.0 ttl pk-yrs)    Types: Cigarettes    Start date: 09/11/2002    Quit date: 09/11/2017    Years since quitting: 6.5   Smokeless tobacco: Former  Building services engineer status: Never Used  Substance and Sexual Activity   Alcohol use: Never   Drug use: Never   Sexual activity: Not Currently  Other Topics Concern   Not on file  Social History Narrative   Disabled, widow, lives with daughter and her family. Patient still drives.    Social Drivers of Corporate investment banker Strain: Low Risk  (04/04/2024)   Overall Financial Resource Strain (CARDIA)    Difficulty of Paying Living Expenses: Not hard at all  Food Insecurity: No Food Insecurity (04/04/2024)   Hunger Vital Sign    Worried About Running Out of Food in the Last Year: Never true    Ran Out of Food in the Last Year: Never true   Transportation Needs: No Transportation Needs (04/04/2024)   PRAPARE - Administrator, Civil Service (Medical): No    Lack of Transportation (Non-Medical): No  Physical Activity: Insufficiently Active (04/04/2024)   Exercise Vital Sign    Days of Exercise per Week: 2 days    Minutes of Exercise per Session: 20 min  Stress: No Stress Concern Present (04/04/2024)   [employer] of Occupational Health - Occupational Stress Questionnaire    Feeling of Stress: Not at all  Social Connections: Socially Isolated (04/04/2024)   Social Connection and Isolation Panel    Frequency of Communication with Friends and Family: More than three times a week    Frequency of Social Gatherings with Friends and Family: Twice a week    Attends Religious Services: Never    Database administrator or Organizations: No    Attends Engineer, structural: Not on file    Marital Status: Widowed    Tobacco Counseling Counseling given: Yes    Clinical Intake:  Pre-visit preparation completed: Yes  Pain : No/denies pain     BMI - recorded: 18.02 Nutritional Status: BMI <19  Underweight Nutritional Risks: None Diabetes: Yes  Lab Results  Component Value Date   HGBA1C 6.4 (H) 02/28/2024   HGBA1C 8.9 (H) 11/27/2023   HGBA1C 7.1 (H) 07/24/2023     How often do you need to have someone help you when you read instructions, pamphlets, or other written materials from your doctor or pharmacy?: 1 - Never  Interpreter Needed?: No  Information entered by :: alia t/cma   Activities of Daily Living     04/09/2024    8:42 PM  In your present state of health, do you have any difficulty performing the following activities:  Hearing? 0  Vision? 1  Difficulty concentrating or making decisions? 0  Walking or climbing stairs? 1  Dressing or bathing? 0  Doing errands, shopping? 0  Preparing Food and eating ? N  Using the Toilet? N  In the past six months, have you accidently leaked  urine? Y  Do you have problems with loss  of bowel control? N  Managing your Medications? N  Managing your Finances? N  Housekeeping or managing your Housekeeping? N    Patient Care Team: Zollie Lowers, MD as PCP - General (Family Medicine) Shaaron Lamar HERO, MD as Consulting Physician (Gastroenterology) Rachele Gaynell RAMAN, MD as Consulting Physician (Nephrology) Geofm Delon BRAVO, NP as Nurse Practitioner (Oncology) Harrie Stark, Deanna M, PA-C (Rheumatology) Pennington, Rebekah M, PA-C as Physician Assistant (Hematology and Oncology)  I have updated your Care Teams any recent Medical Services you may have received from other providers in the past year.     Assessment:   This is a routine wellness examination for [place].  Hearing/Vision screen Hearing Screening - Comments:: Pt denies hearing dif Vision Screening - Comments:: Pt wear glasses and sometimes have vision dif/pt goes [facility] [place]/last ov 3mos ago   Goals Addressed             This Visit's Progress    Increase physical activity   On track    Follow diabtic diet 1200 calories       Depression Screen     04/10/2024    1:11 PM 03/27/2024    1:21 PM 11/27/2023    2:25 PM 08/29/2023    3:31 PM 07/24/2023   10:16 AM 06/14/2023   11:10 AM 06/01/2023    9:37 AM  PHQ 2/9 Scores  PHQ - 2 Score 0 0 0 0 0 0 0  PHQ- 9 Score 0  0  3  3    Fall Risk     04/10/2024    1:08 PM 04/09/2024    8:42 PM 08/29/2023    3:31 PM 07/24/2023   10:16 AM 06/14/2023   11:09 AM  Fall Risk   Falls in the past year? 0 1 0 1 1  Number falls in past yr: 0 0  0 1  Injury with Fall? 0 0  0 0  Risk for fall due to : No Fall Risks   History of fall(s) History of fall(s)  Follow up Falls evaluation completed   Falls evaluation completed Falls evaluation completed    MEDICARE RISK AT HOME:  Medicare Risk at Home Any stairs in or around the home?: (Patient-Rptd) Yes If so, are there any without handrails?: (Patient-Rptd) No Home  free of loose throw rugs in walkways, pet beds, electrical cords, etc?: (Patient-Rptd) Yes Adequate lighting in your home to reduce risk of falls?: (Patient-Rptd) Yes Life alert?: (Patient-Rptd) No Use of a cane, walker or w/c?: (Patient-Rptd) No Grab bars in the bathroom?: (Patient-Rptd) No Shower chair or bench in shower?: (Patient-Rptd) Yes Elevated toilet seat or a handicapped toilet?: (Patient-Rptd) No  TIMED UP AND GO:  Was the test performed?  no  Cognitive Function: 6CIT completed        04/10/2024    1:12 PM 12/13/2022    2:16 PM  6CIT Screen  What Year? 0 points 0 points  What month? 0 points 0 points  What time? 0 points 3 points  Count back from 20 0 points 0 points  Months in reverse 0 points 0 points  Repeat phrase 0 points 0 points  Total Score 0 points 3 points    Immunizations Immunization History  Administered Date(s) Administered   Fluad Quad(high Dose 65+) 07/10/2022   Fluad Trivalent(High Dose 65+) 07/24/2023   Influenza,inj,Quad PF,6+ Mos 07/10/2020, 06/21/2021   Moderna Sars-Covid-2 Vaccination 01/08/2020   PNEUMOCOCCAL CONJUGATE-20 08/06/2021   Td 04/20/2022   Tdap 09/27/2011  Zoster Recombinant(Shingrix ) 04/20/2022, 08/25/2022    Screening Tests Health Maintenance  Topic Date Due   FOOT EXAM  Never done   OPHTHALMOLOGY EXAM  Never done   Medicare Annual Wellness (AWV)  12/13/2023   DEXA SCAN  01/06/2024   INFLUENZA VACCINE  04/26/2024   Diabetic kidney evaluation - Urine ACR  07/23/2024   HEMOGLOBIN A1C  08/29/2024   Diabetic kidney evaluation - eGFR measurement  03/27/2025   Colonoscopy  06/08/2025   MAMMOGRAM  09/07/2025   DTaP/Tdap/Td (3 - Td or Tdap) 04/20/2032   Pneumococcal Vaccine: 50+ Years  Completed   Hepatitis C Screening  Completed   Zoster Vaccines- Shingrix   Completed   Hepatitis B Vaccines  Aged Out   HPV VACCINES  Aged Out   Meningococcal B Vaccine  Aged Out   COVID-19 Vaccine  Discontinued    Health  Maintenance  Health Maintenance Due  Topic Date Due   FOOT EXAM  Never done   OPHTHALMOLOGY EXAM  Never done   Medicare Annual Wellness (AWV)  12/13/2023   DEXA SCAN  01/06/2024   Health Maintenance Items Addressed: See Nurse Notes at the end of this note  Additional Screening:  Vision Screening: Recommended annual ophthalmology exams for early detection of glaucoma and other disorders of the eye. Would you like a referral to an eye doctor? No    Dental Screening: Recommended annual dental exams for proper oral hygiene  Community Resource Referral / Chronic Care Management: CRR required this visit?  No   CCM required this visit?  No   Plan:    I have personally reviewed and noted the following in the patient's chart:   Medical and social history Use of alcohol, tobacco or illicit drugs  Current medications and supplements including opioid prescriptions. Patient is not currently taking opioid prescriptions. Functional ability and status Nutritional status Physical activity Advanced directives List of other physicians Hospitalizations, surgeries, and ER visits in previous 12 months Vitals Screenings to include cognitive, depression, and falls Referrals and appointments  In addition, I have reviewed and discussed with patient certain preventive protocols, quality metrics, and best practice recommendations. A written personalized care plan for preventive services as well as general preventive health recommendations were provided to patient.   Ozie Ned, CMA   04/10/2024   After Visit Summary: (MyChart) Due to this being a telephonic visit, the after visit summary with patients personalized plan was offered to patient via MyChart   Notes: PCP Follow Up Recommendations: pt due foot exam and dexa scan, pt is aware.

## 2024-04-10 NOTE — Patient Instructions (Signed)
 Erica Beasley , Thank you for taking time out of your busy schedule to complete your Annual Wellness Visit with me. I enjoyed our conversation and look forward to speaking with you again next year. I, as well as your care team,  appreciate your ongoing commitment to your health goals. Please review the following plan we discussed and let me know if I can assist you in the future. Your Game plan/ To Do List     Follow up Visits: Next Medicare AWV with our clinical staff: 04/11/25 at 10:00a.m.   Have you seen your provider in the last 6 months (3 months if uncontrolled diabetes)? Yes Next Office Visit with your provider: 05/30/24 at 2:25p.m.  Clinician Recommendations:  Aim for 30 minutes of exercise or brisk walking, 6-8 glasses of water , and 5 servings of fruits and vegetables each day.       This is a list of the screening recommended for you and due dates:  Health Maintenance  Topic Date Due   Complete foot exam   Never done   Eye exam for diabetics  Never done   Medicare Annual Wellness Visit  12/13/2023   DEXA scan (bone density measurement)  01/06/2024   Flu Shot  04/26/2024   Yearly kidney health urinalysis for diabetes  07/23/2024   Hemoglobin A1C  08/29/2024   Yearly kidney function blood test for diabetes  03/27/2025   Colon Cancer Screening  06/08/2025   Mammogram  09/07/2025   DTaP/Tdap/Td vaccine (3 - Td or Tdap) 04/20/2032   Pneumococcal Vaccine for age over 18  Completed   Hepatitis C Screening  Completed   Zoster (Shingles) Vaccine  Completed   Hepatitis B Vaccine  Aged Out   HPV Vaccine  Aged Out   Meningitis B Vaccine  Aged Out   COVID-19 Vaccine  Discontinued    Advanced directives: (Declined) Advance directive discussed with you today. Even though you declined this today, please call our office should you change your mind, and we can give you the proper paperwork for you to fill out. Advance Care Planning is important because it:  [x]  Makes sure you receive the  medical care that is consistent with your values, goals, and preferences  [x]  It provides guidance to your family and loved ones and reduces their decisional burden about whether or not they are making the right decisions based on your wishes.  Follow the link provided in your after visit summary or read over the paperwork we have mailed to you to help you started getting your Advance Directives in place. If you need assistance in completing these, please reach out to us  so that we can help you!  See attachments for Preventive Care and Fall Prevention Tips.

## 2024-04-12 ENCOUNTER — Encounter: Payer: Self-pay | Admitting: Family Medicine

## 2024-04-12 DIAGNOSIS — N3000 Acute cystitis without hematuria: Secondary | ICD-10-CM

## 2024-04-12 MED ORDER — CEPHALEXIN 500 MG PO CAPS: 500.0000 mg | ORAL_CAPSULE | Freq: Two times a day (BID) | ORAL | 0 refills | Status: AC

## 2024-04-29 ENCOUNTER — Other Ambulatory Visit: Payer: Self-pay | Admitting: Family Medicine

## 2024-04-29 DIAGNOSIS — E039 Hypothyroidism, unspecified: Secondary | ICD-10-CM

## 2024-04-30 DIAGNOSIS — M0579 Rheumatoid arthritis with rheumatoid factor of multiple sites without organ or systems involvement: Secondary | ICD-10-CM | POA: Diagnosis not present

## 2024-05-08 ENCOUNTER — Inpatient Hospital Stay: Attending: Hematology

## 2024-05-08 ENCOUNTER — Inpatient Hospital Stay

## 2024-05-08 DIAGNOSIS — D638 Anemia in other chronic diseases classified elsewhere: Secondary | ICD-10-CM | POA: Insufficient documentation

## 2024-05-08 DIAGNOSIS — D631 Anemia in chronic kidney disease: Secondary | ICD-10-CM

## 2024-05-08 DIAGNOSIS — M069 Rheumatoid arthritis, unspecified: Secondary | ICD-10-CM | POA: Insufficient documentation

## 2024-05-08 LAB — CBC
HCT: 37.9 % (ref 36.0–46.0)
Hemoglobin: 11.8 g/dL — ABNORMAL LOW (ref 12.0–15.0)
MCH: 30.2 pg (ref 26.0–34.0)
MCHC: 31.1 g/dL (ref 30.0–36.0)
MCV: 96.9 fL (ref 80.0–100.0)
Platelets: 196 K/uL (ref 150–400)
RBC: 3.91 MIL/uL (ref 3.87–5.11)
RDW: 15.2 % (ref 11.5–15.5)
WBC: 11.2 K/uL — ABNORMAL HIGH (ref 4.0–10.5)
nRBC: 0 % (ref 0.0–0.2)

## 2024-05-08 LAB — SAMPLE TO BLOOD BANK

## 2024-05-08 NOTE — Progress Notes (Signed)
 Patient's Hgb 11.8. per MD parameters pt does not need Retacrit  shot today. Pt updated and agrees to plan. All follow ups as scheduled.   Trelon Plush

## 2024-05-13 ENCOUNTER — Other Ambulatory Visit: Payer: Self-pay | Admitting: Family Medicine

## 2024-05-15 DIAGNOSIS — D631 Anemia in chronic kidney disease: Secondary | ICD-10-CM | POA: Diagnosis not present

## 2024-05-15 DIAGNOSIS — R809 Proteinuria, unspecified: Secondary | ICD-10-CM | POA: Diagnosis not present

## 2024-05-15 DIAGNOSIS — N189 Chronic kidney disease, unspecified: Secondary | ICD-10-CM | POA: Diagnosis not present

## 2024-05-20 ENCOUNTER — Ambulatory Visit: Payer: Self-pay | Admitting: Family Medicine

## 2024-05-20 ENCOUNTER — Encounter: Payer: Self-pay | Admitting: Family Medicine

## 2024-05-20 ENCOUNTER — Ambulatory Visit: Admitting: Family Medicine

## 2024-05-20 VITALS — BP 154/89 | HR 76 | Temp 97.2°F | Ht 64.0 in | Wt 106.2 lb

## 2024-05-20 DIAGNOSIS — R822 Biliuria: Secondary | ICD-10-CM | POA: Diagnosis not present

## 2024-05-20 DIAGNOSIS — R3589 Other polyuria: Secondary | ICD-10-CM | POA: Diagnosis not present

## 2024-05-20 DIAGNOSIS — Z7984 Long term (current) use of oral hypoglycemic drugs: Secondary | ICD-10-CM | POA: Diagnosis not present

## 2024-05-20 DIAGNOSIS — E119 Type 2 diabetes mellitus without complications: Secondary | ICD-10-CM | POA: Diagnosis not present

## 2024-05-20 LAB — URINALYSIS, COMPLETE
Glucose, UA: NEGATIVE
Nitrite, UA: NEGATIVE
RBC, UA: NEGATIVE
Specific Gravity, UA: >1.03 — ABNORMAL HIGH (ref 1.005–1.030)
Urobilinogen, Ur: 0.2 mg/dL (ref 0.2–1.0)
pH, UA: 5.5 (ref 5.0–7.5)

## 2024-05-20 LAB — CBC WITH DIFFERENTIAL/PLATELET

## 2024-05-20 LAB — MICROSCOPIC EXAMINATION

## 2024-05-20 LAB — GLUCOSE HEMOCUE WAIVED: Glu Hemocue Waived: 137 mg/dL — ABNORMAL HIGH (ref 70–99)

## 2024-05-20 MED ORDER — CIPROFLOXACIN HCL 500 MG PO TABS
500.0000 mg | ORAL_TABLET | Freq: Two times a day (BID) | ORAL | 0 refills | Status: DC
Start: 2024-05-20 — End: 2024-07-22

## 2024-05-20 MED ORDER — EMPAGLIFLOZIN 10 MG PO TABS
10.0000 mg | ORAL_TABLET | Freq: Every day | ORAL | 2 refills | Status: DC
Start: 2024-05-20 — End: 2024-08-13

## 2024-05-20 NOTE — Progress Notes (Signed)
 Subjective:  Patient ID: Erica Beasley, female    DOB: 1955-12-14  Age: 68 y.o. MRN: [id number]  CC: Diabetes and Dysuria   HPI  Discussed the use of AI scribe software for clinical note transcription with the patient, who gave verbal consent to proceed.  History of Present Illness [name] is a 68 year old female with diabetes who presents with elevated blood sugar levels.  Over the past month and a half, she has experienced significantly elevated blood sugar levels in the evenings and at bedtime, with evening readings as high as 294, 287, 304, and 435 mg/dL, and bedtime readings reaching 378, 423, 389, and 548 mg/dL. Her morning blood sugar was 148 mg/dL today. She has not changed her medication regimen, which includes metformin , one tablet twice a day with meals.  She mentions a possible urinary tract infection, noting increased frequency of urination at night and difficulty returning to sleep after waking. She has previously taken Cipro  for a UTI, which was effective, but she is not currently on any antibiotics. She has noticed a change in urine color, describing it as orange and not clear.  No recent vomiting, abdominal pain, or swelling. She has a history of taking gabapentin  and questions if it could be affecting her blood sugar levels, but she has not altered her dosage recently. She is scheduled to see her kidney doctor next week.          05/20/2024    7:55 AM 04/10/2024    1:11 PM 03/27/2024    1:21 PM  Depression screen PHQ 2/9  Decreased Interest 0 0 0  Down, Depressed, Hopeless 0 0 0  PHQ - 2 Score 0 0 0  Altered sleeping  0   Tired, decreased energy  0   Change in appetite  0   Feeling bad or failure about yourself   0   Trouble concentrating  0   Moving slowly or fidgety/restless  0   Suicidal thoughts  0   PHQ-9 Score  0   Difficult doing work/chores  Not difficult at all     History Erica Beasley has a past medical history of Aortic atherosclerosis (HCC)  (11/13/2020), CKD (chronic kidney disease) stage 2, GFR 60-89 ml/min, COPD (chronic obstructive pulmonary disease) (HCC), COVID-19 (06/2019), Depression, Essential hypertension, GERD (gastroesophageal reflux disease), H/O Clostridium difficile infection (05/03/2019), History of diabetes mellitus, colonic polyps (05/03/2019), Hyperlipidemia, Hypothyroidism, Neuropathy, Rheumatoid arthritis (HCC), 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   She has a past surgical history that includes Yag laser application (Left, 07/27/2015); Colonoscopy (09/2010); Colonoscopy with propofol  (N/A, 09/16/2019); biopsy (09/16/2019); Esophagogastroduodenoscopy (egd) with propofol  (N/A, 04/28/2020); biopsy (04/28/2020); Cataract extraction (Bilateral); Colonoscopy with propofol  (N/A, 06/08/2020); biopsy (06/08/2020); and Givens capsule study (N/A, 08/12/2022).   Her family history includes Breast cancer in her mother; Diabetes in her mother; Heart disease in her mother; Migraines in her daughter; Other in her father; Stroke in her mother.She reports that she quit smoking about 6 years ago. Her smoking use included cigarettes. She started smoking about 21 years ago. She has a 15 Beasley-year smoking history. She has quit using smokeless tobacco. She reports that she does not drink alcohol and does not use drugs.    ROS Review of Systems  Constitutional: Negative.   HENT: Negative.    Eyes:  Negative for visual disturbance.  Respiratory:  Negative for shortness of breath.   Cardiovascular:  Negative for chest pain.  Gastrointestinal:  Negative for abdominal pain, constipation, diarrhea, nausea and vomiting.  Musculoskeletal:  Negative for arthralgias.    Objective:  BP (!) 154/89   Pulse 76   Temp (!) 97.2 F (36.2 C)   Ht 5' 4 (1.626 m)   Wt 106 lb 3.2 oz (48.2 kg)   SpO2 100%   BMI 18.23 kg/m   BP Readings from Last 3 Encounters:  05/20/24 (!)  154/89  04/10/24 (!) 140/73  04/04/24 (!) 140/73    Wt Readings from Last 3 Encounters:  05/20/24 106 lb 3.2 oz (48.2 kg)  04/10/24 105 lb (47.6 kg)  04/04/24 105 lb (47.6 kg)     Physical Exam Constitutional:      General: She is not in acute distress.    Appearance: She is well-developed.  HENT:     Head: Normocephalic and atraumatic.  Eyes:     Conjunctiva/sclera: Conjunctivae normal.     Pupils: Pupils are equal, round, and reactive to light.  Neck:     Thyroid : No thyromegaly.  Cardiovascular:     Rate and Rhythm: Normal rate and regular rhythm.     Heart sounds: Normal heart sounds. No murmur heard. Pulmonary:     Effort: Pulmonary effort is normal. No respiratory distress.     Breath sounds: Normal breath sounds. No wheezing or rales.  Abdominal:     General: Bowel sounds are normal. There is no distension.     Palpations: Abdomen is soft.     Tenderness: There is abdominal tenderness (mild at RUQ).  Musculoskeletal:        General: Normal range of motion.     Cervical back: Normal range of motion and neck supple.  Lymphadenopathy:     Cervical: No cervical adenopathy.  Skin:    General: Skin is warm and dry.  Neurological:     Mental Status: She is alert and oriented to person, place, and time.  Psychiatric:        Behavior: Behavior normal.        Thought Content: Thought content normal.        Judgment: Judgment normal.      Assessment & Plan:  Polyuria -     Urinalysis, Complete -     CMP14+EGFR -     CBC with Differential/Platelet -     Urine Culture  Diabetes mellitus without complication (HCC) -     Glucose Hemocue Waived -     CMP14+EGFR -     CBC with Differential/Platelet -     Urine Culture  Bilirubinuria -     CMP14+EGFR  Other orders -     Ciprofloxacin  HCl; Take 1 tablet (500 mg total) by mouth 2 (two) times daily.  Dispense: 14 tablet; Refill: 0 -     Empagliflozin ; Take 1 tablet (10 mg total) by mouth daily. To control  diabetes  Dispense: 30 tablet; Refill: 2    Assessment and Plan Assessment & Plan Type 2 diabetes mellitus with poor glycemic control   Blood glucose levels have been significantly elevated in the evenings and at bedtime, reaching up to 548 mg/dL for about one and a half months. She is taking metformin  as prescribed. Gabapentin  is not contributing to hyperglycemia. A1c was previously 7% but is likely increasing due to recent poor control. Prescribe Jardiance  and send the prescription to [facility]. Continue metformin  as currently prescribed. Re-evaluate blood glucose levels at the next appointment in two weeks.  Elevated blood pressure  Blood pressure was elevated during the visit. Monitor blood pressure until the next appointment. Discuss blood pressure management with a kidney specialist at the upcoming appointment.  Urinary tract infection   Increased urinary frequency, especially at night, and urine discoloration suggest a urinary tract infection. Previous treatment with Cipro  was effective. Prescribe Cipro  twice daily for one week. Perform urinalysis at the next appointment in two weeks.  Abnormal liver function tests   Urine analysis showed elevated bilirubin levels, indicating possible liver dysfunction. No liver disease reported. Tenderness noted in the liver area upon examination. Order blood work to assess liver function. If blood work shows elevated bilirubin, order a liver ultrasound.  Recording duration: 8 minutes       Follow-up: No follow-ups on file.  Butler Der, M.D.

## 2024-05-21 LAB — CBC WITH DIFFERENTIAL/PLATELET
Basos: 0
EOS (ABSOLUTE): 0 x10E3/uL (ref 0.0–0.2)
Eos: 1
Hematocrit: 33.4 — AB (ref 34.0–46.6)
Hemoglobin: 10.8 g/dL — AB (ref 11.1–15.9)
Immature Granulocytes: 0.1 x10E3/uL (ref 0.0–0.1)
Immature Granulocytes: 1
Lymphs: 33
MCH: 29.7 pg (ref 26.6–33.0)
MCHC: 32.3 g/dL (ref 31.5–35.7)
MCV: 92 fL (ref 79–97)
Monocytes Absolute: 0.1 x10E3/uL (ref 0.0–0.4)
Monocytes Absolute: 0.7 x10E3/uL (ref 0.1–0.9)
Monocytes: 9
Neutrophils Absolute: 2.6 x10E3/uL (ref 0.7–3.1)
Neutrophils Absolute: 4.3 x10E3/uL (ref 1.4–7.0)
Neutrophils: 56
Platelets: 254 x10E3/uL (ref 150–450)
RBC: 3.64 x10E6/uL — AB (ref 3.77–5.28)
RDW: 14 (ref 11.7–15.4)
WBC: 7.8 x10E3/uL (ref 3.4–10.8)

## 2024-05-21 LAB — CMP14+EGFR
ALT: 14 IU/L (ref 0–32)
AST: 14 IU/L (ref 0–40)
Albumin: 3.5 g/dL — AB (ref 3.9–4.9)
Alkaline Phosphatase: 107 IU/L (ref 44–121)
BUN/Creatinine Ratio: 19 (ref 12–28)
BUN: 21 mg/dL (ref 8–27)
Bilirubin Total: 0.4 mg/dL (ref 0.0–1.2)
CO2: 19 mmol/L — AB (ref 20–29)
Calcium: 9.2 mg/dL (ref 8.7–10.3)
Chloride: 104 mmol/L (ref 96–106)
Creatinine, Ser: 1.1 mg/dL — AB (ref 0.57–1.00)
Globulin, Total: 2.6 g/dL (ref 1.5–4.5)
Glucose: 131 mg/dL — ABNORMAL HIGH (ref 70–99)
Potassium: 4.3 mmol/L (ref 3.5–5.2)
Sodium: 141 mmol/L (ref 134–144)
Total Protein: 6.1 g/dL (ref 6.0–8.5)
eGFR: 55 mL/min/1.73 — AB (ref >59)

## 2024-05-21 NOTE — Progress Notes (Signed)
Hello Taja,  Your lab result is normal and/or stable.Some minor variations that are not significant are commonly marked abnormal, but do not represent any medical problem for you.  Best regards, Loula Marcella, M.D.

## 2024-05-22 ENCOUNTER — Ambulatory Visit: Admitting: Family Medicine

## 2024-05-22 LAB — URINE CULTURE

## 2024-05-27 DIAGNOSIS — N1831 Chronic kidney disease, stage 3a: Secondary | ICD-10-CM | POA: Diagnosis not present

## 2024-05-27 DIAGNOSIS — R803 Bence Jones proteinuria: Secondary | ICD-10-CM | POA: Diagnosis not present

## 2024-05-27 DIAGNOSIS — I129 Hypertensive chronic kidney disease with stage 1 through stage 4 chronic kidney disease, or unspecified chronic kidney disease: Secondary | ICD-10-CM | POA: Diagnosis not present

## 2024-05-27 DIAGNOSIS — E1122 Type 2 diabetes mellitus with diabetic chronic kidney disease: Secondary | ICD-10-CM | POA: Diagnosis not present

## 2024-05-30 ENCOUNTER — Ambulatory Visit: Admitting: Family Medicine

## 2024-05-30 ENCOUNTER — Encounter: Payer: Self-pay | Admitting: Family Medicine

## 2024-05-30 ENCOUNTER — Ambulatory Visit (INDEPENDENT_AMBULATORY_CARE_PROVIDER_SITE_OTHER)

## 2024-05-30 VITALS — BP 111/73 | HR 100 | Temp 98.9°F | Ht 64.0 in | Wt 106.0 lb

## 2024-05-30 DIAGNOSIS — E1169 Type 2 diabetes mellitus with other specified complication: Secondary | ICD-10-CM

## 2024-05-30 DIAGNOSIS — M810 Age-related osteoporosis without current pathological fracture: Secondary | ICD-10-CM

## 2024-05-30 DIAGNOSIS — G8929 Other chronic pain: Secondary | ICD-10-CM | POA: Diagnosis not present

## 2024-05-30 DIAGNOSIS — E039 Hypothyroidism, unspecified: Secondary | ICD-10-CM | POA: Diagnosis not present

## 2024-05-30 DIAGNOSIS — Z780 Asymptomatic menopausal state: Secondary | ICD-10-CM | POA: Diagnosis not present

## 2024-05-30 DIAGNOSIS — E756 Lipid storage disorder, unspecified: Secondary | ICD-10-CM | POA: Diagnosis not present

## 2024-05-30 DIAGNOSIS — G629 Polyneuropathy, unspecified: Secondary | ICD-10-CM | POA: Diagnosis not present

## 2024-05-30 DIAGNOSIS — F339 Major depressive disorder, recurrent, unspecified: Secondary | ICD-10-CM

## 2024-05-30 DIAGNOSIS — M545 Low back pain, unspecified: Secondary | ICD-10-CM | POA: Diagnosis not present

## 2024-05-30 DIAGNOSIS — Z0000 Encounter for general adult medical examination without abnormal findings: Secondary | ICD-10-CM

## 2024-05-30 DIAGNOSIS — Z7984 Long term (current) use of oral hypoglycemic drugs: Secondary | ICD-10-CM | POA: Diagnosis not present

## 2024-05-30 DIAGNOSIS — E782 Mixed hyperlipidemia: Secondary | ICD-10-CM | POA: Diagnosis not present

## 2024-05-30 DIAGNOSIS — M0579 Rheumatoid arthritis with rheumatoid factor of multiple sites without organ or systems involvement: Secondary | ICD-10-CM

## 2024-05-30 DIAGNOSIS — Z13820 Encounter for screening for osteoporosis: Secondary | ICD-10-CM

## 2024-05-30 DIAGNOSIS — E538 Deficiency of other specified B group vitamins: Secondary | ICD-10-CM

## 2024-05-30 DIAGNOSIS — I700 Atherosclerosis of aorta: Secondary | ICD-10-CM | POA: Diagnosis not present

## 2024-05-30 LAB — BAYER DCA HB A1C WAIVED: HB A1C (BAYER DCA - WAIVED): 8.5 % — ABNORMAL HIGH (ref 4.8–5.6)

## 2024-05-30 NOTE — Progress Notes (Signed)
 Subjective:  Patient ID: Erica Beasley, female    DOB: 03-04-1956  Age: 68 y.o. MRN: [id number]  CC: Medical Management of Chronic Issues (3 month follow up)   HPI  Discussed the use of AI scribe software for clinical note transcription with the patient, who gave verbal consent to proceed.  History of Present Illness Erica Beasley is a 68 year old female with diabetes who presents for a routine check-up and evaluation of knee pain.  She experiences knee pain that radiates down her leg, causing difficulty walking and necessitating the use of a walker. The pain occurs frequently. She has consulted an orthopedist who plans to administer gel injections weekly for three weeks starting June 19, 2024.  Her blood sugar levels have been lower in the mornings, ranging from 87 to 137 mg/dL, with no episodes of hypoglycemia. She takes Jardiance  every morning and metformin  750 mg twice daily. Her evening blood sugar levels have decreased from 400-500 mg/dL to the 799d.  She takes rosuvastatin  at night for cholesterol management and potassium during the day. She recently received a new medication from her kidney doctor related to creatinine and protein, but she cannot recall the name.  She takes her thyroid  medication daily in the morning.  Her blood pressure has been stable and well-controlled. She mentions that her belly feels 'just fat' but otherwise fine. She walked well in the house yesterday.          05/30/2024    1:29 PM 05/20/2024    7:55 AM 04/10/2024    1:11 PM  Depression screen PHQ 2/9  Decreased Interest 0 0 0  Down, Depressed, Hopeless 0 0 0  PHQ - 2 Score 0 0 0  Altered sleeping   0  Tired, decreased energy   0  Change in appetite   0  Feeling bad or failure about yourself    0  Trouble concentrating   0  Moving slowly or fidgety/restless   0  Suicidal thoughts   0  PHQ-9 Score   0  Difficult doing work/chores Not difficult at all  Not difficult at all     History Truc has a past medical history of Aortic atherosclerosis (HCC) (11/13/2020), CKD (chronic kidney disease) stage 2, GFR 60-89 ml/min, COPD (chronic obstructive pulmonary disease) (HCC), COVID-19 (06/2019), Depression, Essential hypertension, GERD (gastroesophageal reflux disease), H/O Clostridium difficile infection (05/03/2019), History of diabetes mellitus, colonic polyps (05/03/2019), Hyperlipidemia, Hypothyroidism, Neuropathy, Rheumatoid arthritis (HCC), 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   She has a past surgical history that includes Yag laser application (Left, 07/27/2015); Colonoscopy (09/2010); Colonoscopy with propofol  (N/A, 09/16/2019); biopsy (09/16/2019); Esophagogastroduodenoscopy (egd) with propofol  (N/A, 04/28/2020); biopsy (04/28/2020); Cataract extraction (Bilateral); Colonoscopy with propofol  (N/A, 06/08/2020); biopsy (06/08/2020); and Givens capsule study (N/A, 08/12/2022).   Her family history includes Breast cancer in her mother; Diabetes in her mother; Heart disease in her mother; Migraines in her daughter; Other in her father; Stroke in her mother.She reports that she quit smoking about 6 years ago. Her smoking use included cigarettes. She started smoking about 21 years ago. She has a 15 Beasley-year smoking history. She has quit using smokeless tobacco. She reports that she does not drink alcohol and does not use drugs.    ROS Review of Systems  Objective:  BP 111/73   Pulse 100   Temp 98.9 F (37.2 C)   Ht 5' 4 (1.626 m)   Wt 106 lb (  48.1 kg)   SpO2 95%   BMI 18.19 kg/m   BP Readings from Last 3 Encounters:  05/30/24 111/73  05/20/24 (!) 154/89  04/10/24 (!) 140/73    Wt Readings from Last 3 Encounters:  05/30/24 106 lb (48.1 kg)  05/20/24 106 lb 3.2 oz (48.2 kg)  04/10/24 105 lb (47.6 kg)     Physical Exam Constitutional:      General: She is not in acute distress.     Appearance: She is well-developed. She is ill-appearing.     Comments: Underwweight   HENT:     Head: Normocephalic and atraumatic.  Eyes:     Conjunctiva/sclera: Conjunctivae normal.     Pupils: Pupils are equal, round, and reactive to light.  Neck:     Thyroid : No thyromegaly.  Cardiovascular:     Rate and Rhythm: Normal rate and regular rhythm.     Heart sounds: Normal heart sounds. No murmur heard. Pulmonary:     Effort: Pulmonary effort is normal. No respiratory distress.     Breath sounds: Normal breath sounds. No wheezing or rales.  Abdominal:     General: Bowel sounds are normal. There is no distension.     Palpations: Abdomen is soft.     Tenderness: There is no abdominal tenderness.  Musculoskeletal:        General: Normal range of motion.     Cervical back: Normal range of motion and neck supple.  Lymphadenopathy:     Cervical: No cervical adenopathy.  Skin:    General: Skin is warm and dry.  Neurological:     Mental Status: She is alert and oriented to person, place, and time.  Psychiatric:        Behavior: Behavior normal.        Thought Content: Thought content normal.        Judgment: Judgment normal.      Assessment & Plan:  Aortic atherosclerosis (HCC)  B12 deficiency -     Vitamin B12  Chronic bilateral low back pain without sciatica  Depression, recurrent (HCC)  Diabetic lipidosis (HCC) -     Bayer DCA Hb A1c Waived -     Lipid panel  Rheumatoid arthritis involving multiple sites with positive rheumatoid factor (HCC)  Age related osteoporosis, unspecified pathological fracture presence  Neuropathy  Mixed hyperlipidemia  Hypothyroidism, unspecified type -     TSH + free T4    Assessment and Plan Assessment & Plan Knee and hip pain, likely due to osteoarthritis   Chronic knee and hip pain with walking difficulty, likely due to osteoarthritis. Follow up with orthopedist on September 24 for evaluation and gel injections.  Type 2  diabetes mellitus   Type 2 diabetes mellitus with improved blood glucose control. Morning levels consistently below 100 mg/dL, occasional readings up to 137 mg/dL. Evening levels decreased from 400-500 mg/dL to the 799d. Awaiting A1c results for further management. Continue Jardiance  every morning and metformin  750 mg in the morning and evening. Monitor A1c results; if A1c is 8 or above, consider adding new medication.  Chronic kidney disease, unspecified stage   Chronic kidney disease, unspecified stage, under care of nephrologist, Dr. Lauraine, with new medication for creatinine and protein management. Follow up with Dr. Lauraine in two weeks.  Hypothyroidism   Hypothyroidism. Last thyroid  function test over six months ago, necessitating repeat test. Order thyroid  function test using previously drawn blood. Continue current thyroid  medication regimen.  Mixed hyperlipidemia   Mixed hyperlipidemia, managed with  rosuvastatin  (Crestor ). Continue rosuvastatin  (Crestor ) at night.  Essential hypertension   Essential hypertension, well-controlled with current treatment regimen. Recent blood pressure readings within normal range.  Recording duration: 9 minutes       Follow-up: No follow-ups on file.  Butler Der, M.D.

## 2024-05-31 DIAGNOSIS — M8589 Other specified disorders of bone density and structure, multiple sites: Secondary | ICD-10-CM | POA: Diagnosis not present

## 2024-05-31 LAB — LIPID PANEL
Chol/HDL Ratio: 2.2 ratio (ref 0.0–4.4)
Cholesterol, Total: 128 mg/dL (ref 100–199)
HDL: 58 mg/dL (ref >39)
LDL Chol Calc (NIH): 49 mg/dL (ref 0–99)
Triglycerides: 120 mg/dL (ref 0–149)
VLDL Cholesterol Cal: 21 mg/dL (ref 5–40)

## 2024-05-31 LAB — VITAMIN B12: Vitamin B-12: 1329 pg/mL — ABNORMAL HIGH (ref 232–1245)

## 2024-06-02 ENCOUNTER — Ambulatory Visit: Payer: Self-pay | Admitting: Family Medicine

## 2024-06-02 ENCOUNTER — Encounter: Payer: Self-pay | Admitting: Family Medicine

## 2024-06-09 ENCOUNTER — Ambulatory Visit: Payer: Self-pay | Admitting: Family Medicine

## 2024-06-09 NOTE — Progress Notes (Signed)
 DEXA shows osteopenia. I recommend weekly fosamax. Nurse, if pt. Is agreeable, send in Risedronate 150 mg monthly, #3, 3 refill  Thanks, WS

## 2024-06-10 ENCOUNTER — Other Ambulatory Visit: Payer: Self-pay | Admitting: Family Medicine

## 2024-06-10 ENCOUNTER — Other Ambulatory Visit: Payer: Self-pay | Admitting: *Deleted

## 2024-06-10 DIAGNOSIS — N189 Chronic kidney disease, unspecified: Secondary | ICD-10-CM | POA: Diagnosis not present

## 2024-06-10 DIAGNOSIS — D631 Anemia in chronic kidney disease: Secondary | ICD-10-CM | POA: Diagnosis not present

## 2024-06-10 DIAGNOSIS — E876 Hypokalemia: Secondary | ICD-10-CM

## 2024-06-10 MED ORDER — ALENDRONATE SODIUM 70 MG PO TABS: 70.0000 mg | ORAL_TABLET | ORAL | 11 refills | Status: AC

## 2024-06-10 MED ORDER — POTASSIUM CHLORIDE CRYS ER 20 MEQ PO TBCR: 20.0000 meq | EXTENDED_RELEASE_TABLET | Freq: Every day | ORAL | 6 refills | Status: AC

## 2024-06-14 DIAGNOSIS — R809 Proteinuria, unspecified: Secondary | ICD-10-CM | POA: Diagnosis not present

## 2024-06-14 DIAGNOSIS — I129 Hypertensive chronic kidney disease with stage 1 through stage 4 chronic kidney disease, or unspecified chronic kidney disease: Secondary | ICD-10-CM | POA: Diagnosis not present

## 2024-06-14 DIAGNOSIS — E1122 Type 2 diabetes mellitus with diabetic chronic kidney disease: Secondary | ICD-10-CM | POA: Diagnosis not present

## 2024-06-14 DIAGNOSIS — N1831 Chronic kidney disease, stage 3a: Secondary | ICD-10-CM | POA: Diagnosis not present

## 2024-06-17 DIAGNOSIS — M0579 Rheumatoid arthritis with rheumatoid factor of multiple sites without organ or systems involvement: Secondary | ICD-10-CM | POA: Diagnosis not present

## 2024-06-17 DIAGNOSIS — Z79899 Other long term (current) drug therapy: Secondary | ICD-10-CM | POA: Diagnosis not present

## 2024-06-17 DIAGNOSIS — R5382 Chronic fatigue, unspecified: Secondary | ICD-10-CM | POA: Diagnosis not present

## 2024-06-17 DIAGNOSIS — L409 Psoriasis, unspecified: Secondary | ICD-10-CM | POA: Diagnosis not present

## 2024-06-17 DIAGNOSIS — M25561 Pain in right knee: Secondary | ICD-10-CM | POA: Diagnosis not present

## 2024-06-17 DIAGNOSIS — M1991 Primary osteoarthritis, unspecified site: Secondary | ICD-10-CM | POA: Diagnosis not present

## 2024-06-17 DIAGNOSIS — Z681 Body mass index (BMI) 19 or less, adult: Secondary | ICD-10-CM | POA: Diagnosis not present

## 2024-06-17 DIAGNOSIS — M25562 Pain in left knee: Secondary | ICD-10-CM | POA: Diagnosis not present

## 2024-06-17 DIAGNOSIS — L4059 Other psoriatic arthropathy: Secondary | ICD-10-CM | POA: Diagnosis not present

## 2024-06-19 ENCOUNTER — Inpatient Hospital Stay

## 2024-06-19 DIAGNOSIS — M1712 Unilateral primary osteoarthritis, left knee: Secondary | ICD-10-CM | POA: Diagnosis not present

## 2024-06-20 ENCOUNTER — Encounter: Payer: Self-pay | Admitting: Oncology

## 2024-06-20 ENCOUNTER — Inpatient Hospital Stay

## 2024-06-20 ENCOUNTER — Inpatient Hospital Stay: Attending: Hematology

## 2024-06-20 VITALS — BP 145/75 | HR 66 | Temp 98.1°F | Resp 18

## 2024-06-20 DIAGNOSIS — M069 Rheumatoid arthritis, unspecified: Secondary | ICD-10-CM | POA: Insufficient documentation

## 2024-06-20 DIAGNOSIS — D500 Iron deficiency anemia secondary to blood loss (chronic): Secondary | ICD-10-CM

## 2024-06-20 DIAGNOSIS — D472 Monoclonal gammopathy: Secondary | ICD-10-CM

## 2024-06-20 DIAGNOSIS — M810 Age-related osteoporosis without current pathological fracture: Secondary | ICD-10-CM

## 2024-06-20 DIAGNOSIS — D638 Anemia in other chronic diseases classified elsewhere: Secondary | ICD-10-CM | POA: Diagnosis not present

## 2024-06-20 DIAGNOSIS — R803 Bence Jones proteinuria: Secondary | ICD-10-CM

## 2024-06-20 DIAGNOSIS — N1831 Chronic kidney disease, stage 3a: Secondary | ICD-10-CM

## 2024-06-20 LAB — COMPREHENSIVE METABOLIC PANEL WITH GFR
ALT: 19 U/L (ref 0–44)
AST: 17 U/L (ref 15–41)
Albumin: 2.9 g/dL — ABNORMAL LOW (ref 3.5–5.0)
Alkaline Phosphatase: 74 U/L (ref 38–126)
Anion gap: 14 (ref 5–15)
BUN: 18 mg/dL (ref 8–23)
CO2: 22 mmol/L (ref 22–32)
Calcium: 8.7 mg/dL — ABNORMAL LOW (ref 8.9–10.3)
Chloride: 106 mmol/L (ref 98–111)
Creatinine, Ser: 1.06 mg/dL — ABNORMAL HIGH (ref 0.44–1.00)
GFR, Estimated: 58 mL/min — ABNORMAL LOW (ref >60)
Glucose, Bld: 149 mg/dL — ABNORMAL HIGH (ref 70–99)
Potassium: 3.3 mmol/L — ABNORMAL LOW (ref 3.5–5.1)
Sodium: 142 mmol/L (ref 135–145)
Total Bilirubin: 0.5 mg/dL (ref 0.0–1.2)
Total Protein: 6.1 g/dL — ABNORMAL LOW (ref 6.5–8.1)

## 2024-06-20 LAB — CBC WITH DIFFERENTIAL/PLATELET
Abs Immature Granulocytes: 0.02 K/uL (ref 0.00–0.07)
Basophils Absolute: 0 K/uL (ref 0.0–0.1)
Basophils Relative: 0 %
Eosinophils Absolute: 0.1 K/uL (ref 0.0–0.5)
Eosinophils Relative: 1 %
HCT: 30.9 % — ABNORMAL LOW (ref 36.0–46.0)
Hemoglobin: 9.4 g/dL — ABNORMAL LOW (ref 12.0–15.0)
Immature Granulocytes: 0 %
Lymphocytes Relative: 20 %
Lymphs Abs: 1.4 K/uL (ref 0.7–4.0)
MCH: 30.4 pg (ref 26.0–34.0)
MCHC: 30.4 g/dL (ref 30.0–36.0)
MCV: 100 fL (ref 80.0–100.0)
Monocytes Absolute: 0.5 K/uL (ref 0.1–1.0)
Monocytes Relative: 8 %
Neutro Abs: 5 K/uL (ref 1.7–7.7)
Neutrophils Relative %: 71 %
Platelets: 236 K/uL (ref 150–400)
RBC: 3.09 MIL/uL — ABNORMAL LOW (ref 3.87–5.11)
RDW: 14.9 % (ref 11.5–15.5)
WBC: 7 K/uL (ref 4.0–10.5)
nRBC: 0 % (ref 0.0–0.2)

## 2024-06-20 LAB — IRON AND TIBC
Iron: 83 ug/dL (ref 28–170)
Saturation Ratios: 39 % — ABNORMAL HIGH (ref 10.4–31.8)
TIBC: 211 ug/dL — ABNORMAL LOW (ref 250–450)
UIBC: 128 ug/dL

## 2024-06-20 LAB — FERRITIN: Ferritin: 892 ng/mL — ABNORMAL HIGH (ref 11–307)

## 2024-06-20 LAB — LACTATE DEHYDROGENASE: LDH: 199 U/L — ABNORMAL HIGH (ref 98–192)

## 2024-06-20 MED ORDER — EPOETIN ALFA-EPBX 10000 UNIT/ML IJ SOLN
10000.0000 [IU] | Freq: Once | INTRAMUSCULAR | Status: AC
  Administered 2024-06-20: 10000 [IU] via SUBCUTANEOUS
  Filled 2024-06-20: qty 1

## 2024-06-20 NOTE — Progress Notes (Signed)
 Erica Beasley presents today for Retacrit  injection per the provider's orders.  Hgb noted to be 9.4.  Stable during administration without incident; injection site WNL; see MAR for injection details.  Patient tolerated procedure well and without incident.  No questions or complaints noted at this time.

## 2024-06-21 LAB — KAPPA/LAMBDA LIGHT CHAINS
Kappa free light chain: 35 mg/L — ABNORMAL HIGH (ref 3.3–19.4)
Kappa, lambda light chain ratio: 1.6 (ref 0.26–1.65)
Lambda free light chains: 21.9 mg/L (ref 5.7–26.3)

## 2024-06-23 LAB — IMMUNOFIXATION ELECTROPHORESIS
IgA: 155 mg/dL (ref 87–352)
IgG (Immunoglobin G), Serum: 560 mg/dL — ABNORMAL LOW (ref 586–1602)
IgM (Immunoglobulin M), Srm: 45 mg/dL (ref 26–217)
Total Protein ELP: 5.8 g/dL — ABNORMAL LOW (ref 6.0–8.5)

## 2024-06-24 LAB — PROTEIN ELECTROPHORESIS, SERUM
A/G Ratio: 1.1 (ref 0.7–1.7)
Albumin ELP: 3 g/dL (ref 2.9–4.4)
Alpha-1-Globulin: 0.4 g/dL (ref 0.0–0.4)
Alpha-2-Globulin: 0.9 g/dL (ref 0.4–1.0)
Beta Globulin: 0.9 g/dL (ref 0.7–1.3)
Gamma Globulin: 0.5 g/dL (ref 0.4–1.8)
Globulin, Total: 2.7 g/dL (ref 2.2–3.9)
Total Protein ELP: 5.7 g/dL — ABNORMAL LOW (ref 6.0–8.5)

## 2024-06-25 ENCOUNTER — Ambulatory Visit: Admitting: Physician Assistant

## 2024-06-26 DIAGNOSIS — M1712 Unilateral primary osteoarthritis, left knee: Secondary | ICD-10-CM | POA: Diagnosis not present

## 2024-06-26 NOTE — Progress Notes (Unsigned)
 [facility] [address], [place] [address]   CLINIC:  Medical Oncology/Hematology  PCP:  Zollie Lowers, MD [address] [place] [address] [phone]   REASON FOR VISIT:  Follow-up for normocytic anemia  INTERVAL HISTORY:   Erica Beasley 68 y.o. female returns for routine follow-up of her normocytic anemia.   She was last seen by Pleasant Barefoot PA-C on 03/27/2024.   She has not required any IV iron or blood transfusion since her last visit. Received first dose of Retacrit  on 01/29/2024, tolerated this well, but has only required Retacrit  injection on one occasion (06/20/2024) since then. She has baseline fatigue.*** No rectal bleeding, melena, or other source of blood loss.*** No pica, headaches, chest pain, lightheadedness, syncope, or dyspnea on exertion.*** No B symptoms such as fever, chills, night sweats, unintentional weight loss.*** She reports about 50***% energy.*** She continues to have ongoing joint pain from her rheumatoid arthritis.*** Her last Rituxan was on *** August?*** and ***.  She continues to take hydroxychloroquine .  *** Her weight is stable, with BMI 17.75, appetite comes and goes.  ASSESSMENT & PLAN:  1.  NORMOCYTIC ANEMIA -  BMBX on 07/19/2021: Mildly hypercellular marrow with trilineage hematopoiesis.  No increased ring sideroblasts or blasts.  3% polytypic plasma cells.  No large aggregates of plasma cells.  Iron stores increased.  Chromosome analysis normal. - Etiology of patient's anemia remains multifactorial.   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 Myelosuppression from rheumatology medications.  Leflunomide  and sulfasalazine  discontinued due to anemia, with significant improvement in blood counts.  Remains on hydroxychloroquine  and started on Rituxan as of September 2024. Patient has been NEGATIVE for Hemoccult stool on multiple occasions within the past year.   No obvious rectal bleeding or melena.  EGD, colonoscopy, capsule studies did not reveal any source of major blood loss. Bone marrow biopsy was nondiagnostic.  Hemolysis labs and nutritional deficiency work-up have been unremarkable, apart from significant functional iron deficiency (no improvement in hemoglobin after IV iron) Evidence of emerging CKD started in January 2025 - She is taking vitamin B12 tablets (500 mcg EOD) at home. - Trial of monthly Feraheme x2 doses did NOT improve her hemoglobin.  Iron tablets discontinued due to elevated iron levels. - She has required intermittent blood transfusions, most recently on 12/30/2022 (Hgb 7.0) - Leflunomide  was discontinued by rheumatology on 01/18/2023.  Patient's blood count improved significantly after leflunomide  was stopped. - Started on rituximab  (1000 mg IV every 2 weeks x 2 doses, repeated every 4 months) by her rheumatologist, first dose on 06/07/2023. Prior to starting Rituxan her anemia had resolved with Hgb 12.1 (04/14/2023) and Hgb 11.3 (05/24/2023).  After starting Rituxan, she had a drop in hemoglobin to 10.5 on 06/21/2023, and Hgb 8.7 as of 06/28/2023.  Blood count gradually recovered after Rituxan.    Most recent dose of Rituxan on *** and ***  Next dose of Rituxan will be due in around *** NOTE: Discussed with Dr. Rogers on 08/02/2023.  He agrees likely culprit is  anemia of chronic disease worsened by myelosuppression from Rituxan.  - Workup of recurrent anemia (September/October 2024) No nutritional deficiencies (ferritin 1267, iron saturation 46%, low TIBC 195.  Normal B12/MMA.) No evidence of Rituxan induced hemolysis (DAT negative, normal haptoglobin, normal LDH, normal bilirubin) Reticulocytes elevated to 3.7%, which could indicate compensation for recent blood loss or signs of bone marrow recovery following suppression from Rituxan -  Started on Retacrit  as of May 2025, only required 2 doses to date (given on 01/29/2024 and  06/20/2024).  Current dose is Retacrit  10,000 units every 6 weeks to maintain Hgb >10. - Most recent labs (06/20/2024): Hgb 9.4/MCV 100.0. Ferritin 892, iron saturation 39% Creatinine 1.06/GFR 58.  (Sees Dr. Rachele) (Previously normal B12, folate, MMA, LDH as of January 2025) - She has baseline fatigue slightly improved*** - No bright red blood per rectum or melena*** - PLAN: Continue Retacrit  10,000 units every 6 weeks to maintain Hgb >10  - Continue nephrology follow-up. - RTC 3 months  - Continue vitamin B12 500 mcg every other day.  (Recheck B12/MMA in January 2026)  2.  Bence-Jones proteinuria + MGUS - 24-hour urine done by Dr. Rachele showed urine immunofixation positive for Bence-Jones proteinuria, kappa type.  24-hour total protein was 77 mg/day. - Free kappa light chains were 52.9, lambda light chains 29.8 with ratio of 1.78.  Serum immunofixation was unremarkable.  SPEP did not show any evidence of M spike. - Labs on 04/03/2020 shows negative immunofixation.  Kappa light chain 61.3, lambda light chains 32.3, ratio 1.9. - Skeletal survey on 01/29/2024 was negative for lytic lesions. - Annual 24-hour urine/UPEP/UIFE/TP (01/22/2024): Total protein 629 mg/24-hr.  Urine immunofixation normal, M spike not observed.  Mildly elevated urine kappa light chains 116, mildly elevated lambda 34.8, normal urine FLC ratio 3.34. - Serum immunofixation from 01/22/2024 showed IgG kappa monoclonal protein (previously not detected), with M spike 0.1 - Most recent MGUS/myeloma panel (06/20/2024) Serum immunofixation = negative (previously showed IgG kappa monoclonal protein) SPEP negative for M spike.  (Previous M spike was 0.1) Mildly elevated serum kappa free light chain 35.0, normal lambda 21.9, normal ratio 1.60 Hgb 9.4, creatinine 1.06, calcium  8.7 - No new onset bone pain or neurologic changes *** - PLAN: Continue monitoring of MGUS/Bence-Jones proteinuria with labs every 6 months (April 2026), annual  urine study (April 2026), annual skeletal survey (May 2026).  3.  Failure to thrive with low BMI*** - Intermittent episodes of poor appetite and weight loss associated with painful flareups of her RA - Weight has been fluctuating from 90 to 100 pounds over the past year, with BMI around 17.  As of today's visit (***), her weight has improved at 160 pounds, with BMI up to 18.28.*** - She has been evaluated by nutritionist (phone note 01/30/2023) - EGD on 04/28/2020 with stomach and duodenal biopsy negative. - CT CAP on 05/11/2020 showed long segment colonic thickening likely diverticular disease.  Stable small juxtapleural nodule in the right upper chest benign over 1 year.  Moderate large fat-containing left inguinal hernia.  Emphysema and diverticular calcification. - Colonoscopy on 06/08/2020 shows pancolonic diverticulosis, mild.  Normal-appearing colonic mucosa otherwise.  Random biopsies were negative.  4.  Rheumatoid arthritis - Follows with [facility] [place], [place]) - Leflunomide  discontinued in June 2024 due to anemia - Rituxan infusions started on 06/07/2023 - She continues to take hydroxychloroquine   5.  Osteoporosis - DEXA scan on 01/05/2022 with T score -2.7. - Recommended Prolia but her insurance did not cover it. - She started on Zometa  on 02/23/2022.  Most recent dose of Zometa  on 02/26/2024. - Normal vitamin D  (10/24/2022) at 38.1 - PLAN: Continue annual Zometa  for total duration of 3 to 5 years.  Next Zometa  due around 02/26/2025.  Continue calcium  supplements. - Check calcium  and Vitamin D  with next appotinment  PLAN SUMMARY: *** >> CBC/BB sample + RETACRIT  INJECTION every 6 weeks >>  LABS in 3 months same day as INJECTION =  CBC/D, CMP, ferritin, iron/TIBC, B12, MMA *** >> OFFICE visit in 3 months (1 week AFTER labs and injection)    REVIEW OF SYSTEMS:***  Review of Systems  Constitutional:  Positive for fatigue. Negative for appetite change, chills,  diaphoresis, fever and unexpected weight change.  HENT:   Negative for lump/mass, nosebleeds and trouble swallowing.   Eyes:  Negative for eye problems.  Respiratory:  Negative for cough, hemoptysis and shortness of breath.   Cardiovascular:  Negative for chest pain, leg swelling and palpitations.  Gastrointestinal:  Negative for abdominal pain, blood in stool, constipation, diarrhea, nausea and vomiting.  Genitourinary:  Negative for hematuria.   Musculoskeletal:  Positive for arthralgias and back pain.  Skin: Negative.   Neurological:  Negative for dizziness, extremity weakness (Residual deficit from CVA), headaches and light-headedness.  Hematological:  Does not bruise/bleed easily.  Psychiatric/Behavioral:  Negative for depression. The patient is not nervous/anxious.      PHYSICAL EXAM:***  ECOG PERFORMANCE STATUS: 1 - Symptomatic but completely ambulatory  There were no vitals filed for this visit.  Physical Exam Vitals reviewed.  Constitutional:      Appearance: Normal appearance. She is underweight.  Cardiovascular:     Rate and Rhythm: Normal rate and regular rhythm.     Pulses: Normal pulses.     Heart sounds: Normal heart sounds.  Pulmonary:     Effort: Pulmonary effort is normal.     Breath sounds: Normal breath sounds.  Neurological:     Mental Status: She is alert and oriented to person, place, and time. Mental status is at baseline.  Psychiatric:        Mood and Affect: Mood normal.        Behavior: Behavior normal.    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 Tobie: mild diverticulsis in sigmoid colon.    COLONOSCOPY WITH PROPOFOL  N/A 09/16/2019   Rourk: Diverticulosis, random colon biopsies negative for microscopic colitis.   COLONOSCOPY WITH PROPOFOL  N/A 06/08/2020   Procedure: COLONOSCOPY WITH PROPOFOL ;  Surgeon: Shaaron Lamar HERO, MD;  [facility];  Service: Endoscopy;  Laterality: N/A;  9:15am   ESOPHAGOGASTRODUODENOSCOPY (EGD) WITH PROPOFOL  N/A 04/28/2020   Procedure: ESOPHAGOGASTRODUODENOSCOPY (EGD) WITH PROPOFOL ;  Surgeon: Shaaron Lamar HERO, MD;  [facility];  Service: Endoscopy;  Laterality: N/A;  2:15   GIVENS CAPSULE STUDY N/A 08/12/2022   Procedure: GIVENS CAPSULE STUDY;  Surgeon: Shaaron Lamar HERO, MD;  [facility];  Service: Endoscopy;  Laterality: N/A;   YAG LASER APPLICATION Left 07/27/2015   Procedure: YAG LASER APPLICATION;  Surgeon: Dow JULIANNA Burke, MD;  Location: [facility];  Service: Ophthalmology;  Laterality: Left;    SOCIAL HISTORY:  Social History   Socioeconomic History   Marital status: Widowed    Spouse name: Not on file   Number of children: 1   Years of education: Not on file   Highest education level: 12th grade  Occupational History   Occupation: DISABLED  Tobacco Use   Smoking status: Former    Current packs/day: 0.00     Average packs/day: 1 pack/day for 15.0 years (15.0 ttl pk-yrs)    Types: Cigarettes    Start date: 09/11/2002    Quit date: 09/11/2017    Years since quitting: 6.7   Smokeless tobacco: Former  Building services engineer status: Never Used  Substance and Sexual Activity   Alcohol use: Never   Drug use: Never   Sexual activity: Not Currently  Other Topics Concern   Not on file  Social History Narrative   Disabled, widow, lives with daughter and her family. Patient still drives.    Social Drivers of Corporate investment banker Strain: Low Risk  (04/04/2024)   Overall Financial Resource Strain (CARDIA)    Difficulty of Paying Living Expenses: Not hard at all  Food Insecurity: No Food Insecurity (04/04/2024)   Hunger Vital Sign    Worried About Running Out of Food in the Last Year: Never true    Ran Out of Food in the Last Year: Never true  Transportation Needs: No Transportation Needs (04/04/2024)   PRAPARE - Administrator, Civil Service (Medical): No    Lack of Transportation (Non-Medical): No  Physical Activity: Insufficiently Active (04/04/2024)   Exercise Vital Sign    Days of Exercise per Week: 2 days    Minutes of Exercise per Session: 20 min  Stress: No Stress Concern Present (04/04/2024)   [employer] of Occupational Health - Occupational Stress Questionnaire    Feeling of Stress: Not at all  Social Connections: Socially Isolated (04/04/2024)   Social Connection and Isolation Panel    Frequency of Communication with Friends and Family: More than three times a week    Frequency of Social Gatherings with Friends and Family: Twice a week    Attends Religious Services: Never    Database administrator or Organizations: No    Attends Engineer, structural: Not on file    Marital Status: Widowed  Intimate Partner Violence: Not At Risk (04/10/2024)   Humiliation, Afraid, Rape, and Kick questionnaire    Fear of Current or Ex-Partner: No    Emotionally  Abused: No    Physically Abused: No    Sexually Abused: No    FAMILY HISTORY:  Family History  Problem Relation Age of Onset   Stroke Mother    Heart disease Mother    Diabetes Mother    Breast cancer Mother    Other Father        stomach taken out for some reason   Migraines Daughter    Colon cancer Neg Hx     CURRENT MEDICATIONS:  Outpatient Encounter Medications as of 06/27/2024  Medication Sig  albuterol  (VENTOLIN  HFA) 108 (90 Base) MCG/ACT inhaler Inhale 2 puffs into the lungs every 6 (six) hours as needed for wheezing or shortness of breath.   alendronate  (FOSAMAX ) 70 MG tablet Take 1 tablet (70 mg total) by mouth every 7 (seven) days. Take with a full glass of water  on an empty stomach.   aspirin  EC 81 MG tablet Take 1 tablet (81 mg total) by mouth daily. Swallow whole.   Blood Glucose Monitoring Suppl DEVI 1 each by Does not apply route in the morning, at noon, and at bedtime. May substitute to any manufacturer covered by patient's insurance.   calcium  carbonate (OSCAL) 1500 (600 Ca) MG TABS tablet Take 1,500 mg by mouth 2 (two) times daily with a meal.   ciprofloxacin  (CIPRO ) 500 MG tablet Take 1 tablet (500 mg total) by mouth 2 (two) times daily.   colchicine 0.6 MG tablet Take 0.6 mg by mouth daily.   Cyanocobalamin  (B-12) 500 MCG TABS Take 500 mcg by mouth every other day.   desvenlafaxine  (PRISTIQ ) 50 MG 24 hr tablet Take 1 tablet (50 mg total) by mouth daily.   empagliflozin  (JARDIANCE ) 10 MG TABS tablet Take 1 tablet (10 mg total) by mouth daily. To control diabetes   etodolac (LODINE) 300 MG capsule Take 300 mg by mouth 2 (two) times daily.   folic acid  (FOLVITE ) 1 MG tablet TAKE ONE TABLET ONCE DAILY   gabapentin  (NEURONTIN ) 300 MG capsule Take 2 capsules (600 mg total) by mouth 2 (two) times daily.   glucose blood (ACCU-CHEK GUIDE TEST) test strip Check BS twice daily before meals Dx R73.9   hydroxychloroquine  (PLAQUENIL ) 200 MG tablet Take 200 mg by mouth 2 (two)  times daily.   levothyroxine  (SYNTHROID ) 125 MCG tablet TAKE ONE TABLET EVERY MORNING   liothyronine  (CYTOMEL ) 5 MCG tablet TAKE TWO TABLETS BY MOUTH DAILY   megestrol  (MEGACE ) 40 MG/ML suspension TAKE 10 ML BY MOUTH TWICE DAILY   metFORMIN  (GLUCOPHAGE -XR) 750 MG 24 hr tablet Take 1 tablet (750 mg total) by mouth 2 (two) times daily with a meal.   potassium chloride  SA (KLOR-CON  M) 20 MEQ tablet Take 1 tablet (20 mEq total) by mouth daily.   predniSONE  (DELTASONE ) 5 MG tablet Take 5-10 mg by mouth daily.   rosuvastatin  (CRESTOR ) 40 MG tablet Take 1 tablet (40 mg total) by mouth daily.   traZODone  (DESYREL ) 100 MG tablet Take 1 tablet (100 mg total) by mouth at bedtime as needed. for sleep   No facility-administered encounter medications on file as of 06/27/2024.    ALLERGIES:  Allergies  Allergen Reactions   Adalimumab     Other reaction(s): no response Pt is unaware of this Other reaction(s): no response   Methotrexate Other (See Comments)    Other reaction(s): GI side effects   Sulfasalazine      Other Reaction(s): Anemia   Upadacitinib Other (See Comments)    Other reaction(s): GI side effects    LABORATORY DATA:  I have reviewed the labs as listed.  CBC    Component Value Date/Time   WBC 7.0 06/20/2024 1135   RBC 3.09 (L) 06/20/2024 1135   HGB 9.4 (L) 06/20/2024 1135   HGB 10.8 (L) 05/20/2024 0848   HCT 30.9 (L) 06/20/2024 1135   HCT 33.4 (L) 05/20/2024 0848   PLT 236 06/20/2024 1135   PLT 254 05/20/2024 0848   MCV 100.0 06/20/2024 1135   MCV 92 05/20/2024 0848   MCH 30.4 06/20/2024 1135   MCHC 30.4  06/20/2024 1135   RDW 14.9 06/20/2024 1135   RDW 14.0 05/20/2024 0848   LYMPHSABS 1.4 06/20/2024 1135   LYMPHSABS 2.6 05/20/2024 0848   MONOABS 0.5 06/20/2024 1135   EOSABS 0.1 06/20/2024 1135   EOSABS 0.1 05/20/2024 0848   BASOSABS 0.0 06/20/2024 1135   BASOSABS 0.0 05/20/2024 0848      Latest Ref Rng & Units 06/20/2024   11:35 AM 05/20/2024    8:48 AM 03/27/2024    12:40 PM  CMP  Glucose 70 - 99 mg/dL 850  868  750   BUN 8 - 23 mg/dL 18  21  16    Creatinine 0.44 - 1.00 mg/dL 8.93  8.89  9.15   Sodium 135 - 145 mmol/L 142  141  135   Potassium 3.5 - 5.1 mmol/L 3.3  4.3  4.7   Chloride 98 - 111 mmol/L 106  104  104   CO2 22 - 32 mmol/L 22  19  19    Calcium  8.9 - 10.3 mg/dL 8.7  9.2  9.4   Total Protein 6.5 - 8.1 g/dL 6.1  6.1  6.4   Total Bilirubin 0.0 - 1.2 mg/dL 0.5  0.4  0.3   Alkaline Phos 38 - 126 U/L 74  107  80   AST 15 - 41 U/L 17  14  17    ALT 0 - 44 U/L 19  14  14      DIAGNOSTIC IMAGING:  I have independently reviewed the relevant imaging and discussed with the patient.   WRAP UP:  All questions were answered. The patient knows to call the clinic with any problems, questions or concerns.  Medical decision making: Moderate***  Time spent on visit: I spent 20 minutes counseling the patient face to face. The total time spent in the appointment was 30 minutes and more than 50% was on counseling.  Pleasant CHRISTELLA Barefoot, PA-C  ***

## 2024-06-27 ENCOUNTER — Inpatient Hospital Stay: Attending: Hematology | Admitting: Physician Assistant

## 2024-06-27 VITALS — BP 161/88 | HR 64 | Temp 98.1°F | Resp 18 | Wt 108.2 lb

## 2024-06-27 DIAGNOSIS — N1831 Chronic kidney disease, stage 3a: Secondary | ICD-10-CM | POA: Diagnosis not present

## 2024-06-27 DIAGNOSIS — D472 Monoclonal gammopathy: Secondary | ICD-10-CM | POA: Insufficient documentation

## 2024-06-27 DIAGNOSIS — Z803 Family history of malignant neoplasm of breast: Secondary | ICD-10-CM | POA: Diagnosis not present

## 2024-06-27 DIAGNOSIS — M810 Age-related osteoporosis without current pathological fracture: Secondary | ICD-10-CM | POA: Insufficient documentation

## 2024-06-27 DIAGNOSIS — R627 Adult failure to thrive: Secondary | ICD-10-CM | POA: Insufficient documentation

## 2024-06-27 DIAGNOSIS — Z87891 Personal history of nicotine dependence: Secondary | ICD-10-CM | POA: Insufficient documentation

## 2024-06-27 DIAGNOSIS — D631 Anemia in chronic kidney disease: Secondary | ICD-10-CM

## 2024-06-27 DIAGNOSIS — E538 Deficiency of other specified B group vitamins: Secondary | ICD-10-CM

## 2024-06-27 DIAGNOSIS — M069 Rheumatoid arthritis, unspecified: Secondary | ICD-10-CM | POA: Insufficient documentation

## 2024-06-27 DIAGNOSIS — R803 Bence Jones proteinuria: Secondary | ICD-10-CM | POA: Diagnosis not present

## 2024-06-27 DIAGNOSIS — D638 Anemia in other chronic diseases classified elsewhere: Secondary | ICD-10-CM | POA: Insufficient documentation

## 2024-06-27 NOTE — Patient Instructions (Signed)
 [facility] at [facility] **VISIT SUMMARY & IMPORTANT INSTRUCTIONS **   You were seen today by Pleasant Barefoot PA-C for your anemia.    ANEMIA: Your blood counts had gotten better after your leflunomide  was stopped.  We believe that Rituxan has also been contributing to your anemia, although you have some chronic anemia related to chronic inflammation. We will continue to check blood counts once every 6 weeks.  ANOTHER REASON FOR YOUR ANEMIA IS YOUR CHRONIC KIDNEY DISEASE...    We will give you RETACRIT  INJECTIONS every 6 weeks as needed (if Hgb < 10) to help your body make more healthy blood cells. Continue taking vitamin B12 500 mcg every other day. I will see you for FOLLOW UP in 3 months to make sure that you are doing well on these injections.  MGUS (monoclonal gammopathy of undetermined significance) As we discussed, you have a slightly increased amount of abnormal protein in your urine, which can be a sign of a precancerous condition. We will continue to check these labs every 6 months.  ** Please continue to work on eating plenty of calories, even when you are in pain.  It is important that you do not lose any more weight.  ** Thank you for trusting me with your healthcare!  I strive to provide all of my patients with quality care at each visit.  If you receive a survey for this visit, I would be so grateful to you for taking the time to provide feedback.  Thank you in advance!  ~ Julius Matus                                        Dr. Mickiel Davonna Pleasant Barefoot, PA-C     Delon Hope, NP   - - - - - - - - - - - - - - - - - -     Thank you for choosing [facility] at [facility] to provide your oncology and hematology care.  To afford each patient quality time with our provider, please arrive at least 15 minutes before your scheduled appointment time.   If you have a lab appointment with the Cancer Center please come in  thru the Main Entrance and check in at the main information desk.  You need to re-schedule your appointment should you arrive 10 or more minutes late.  We strive to give you quality time with our providers, and arriving late affects you and other patients whose appointments are after yours.  Also, if you no show three or more times for appointments you may be dismissed from the clinic at the providers discretion.     Again, thank you for choosing [facility].  Our hope is that these requests will decrease the amount of time that you wait before being seen by our physicians.       _____________________________________________________________  Should you have questions after your visit to [facility], please contact our office at [phone] and follow the prompts.  Our office hours are 8:00 a.m. and 4:30 p.m. Monday - Friday.  Please note that voicemails left after 4:00 p.m. may not be returned until the following business day.  We are closed weekends and major holidays.  You do have access to a nurse 24-7, just call the main number to the  clinic [phone] and do not press any options, hold on the line and a nurse will answer the phone.    For prescription refill requests, have your pharmacy contact our office and allow 72 hours.

## 2024-07-03 DIAGNOSIS — M1712 Unilateral primary osteoarthritis, left knee: Secondary | ICD-10-CM | POA: Diagnosis not present

## 2024-07-15 DIAGNOSIS — E119 Type 2 diabetes mellitus without complications: Secondary | ICD-10-CM | POA: Diagnosis not present

## 2024-07-15 DIAGNOSIS — H01005 Unspecified blepharitis left lower eyelid: Secondary | ICD-10-CM | POA: Diagnosis not present

## 2024-07-15 DIAGNOSIS — H401330 Pigmentary glaucoma, bilateral, stage unspecified: Secondary | ICD-10-CM | POA: Diagnosis not present

## 2024-07-15 DIAGNOSIS — H01004 Unspecified blepharitis left upper eyelid: Secondary | ICD-10-CM | POA: Diagnosis not present

## 2024-07-15 DIAGNOSIS — M069 Rheumatoid arthritis, unspecified: Secondary | ICD-10-CM | POA: Diagnosis not present

## 2024-07-15 DIAGNOSIS — H01001 Unspecified blepharitis right upper eyelid: Secondary | ICD-10-CM | POA: Diagnosis not present

## 2024-07-15 DIAGNOSIS — H40051 Ocular hypertension, right eye: Secondary | ICD-10-CM | POA: Diagnosis not present

## 2024-07-15 DIAGNOSIS — Z79899 Other long term (current) drug therapy: Secondary | ICD-10-CM | POA: Diagnosis not present

## 2024-07-15 DIAGNOSIS — Z961 Presence of intraocular lens: Secondary | ICD-10-CM | POA: Diagnosis not present

## 2024-07-15 DIAGNOSIS — H01002 Unspecified blepharitis right lower eyelid: Secondary | ICD-10-CM | POA: Diagnosis not present

## 2024-07-15 LAB — OPHTHALMOLOGY REPORT-SCANNED

## 2024-07-16 NOTE — Progress Notes (Signed)
 Erica Beasley                                          MRN: [id number]   07/16/2024   The [facility] Quality Team Specialist reviewed this patient medical record for the purposes of chart review for care gap closure. The following were reviewed: chart review for care gap closure-kidney health evaluation for diabetes:eGFR  and uACR.    [facility] Quality Team

## 2024-07-21 ENCOUNTER — Telehealth

## 2024-07-21 DIAGNOSIS — B9689 Other specified bacterial agents as the cause of diseases classified elsewhere: Secondary | ICD-10-CM | POA: Diagnosis not present

## 2024-07-21 DIAGNOSIS — T3695XA Adverse effect of unspecified systemic antibiotic, initial encounter: Secondary | ICD-10-CM

## 2024-07-21 DIAGNOSIS — B379 Candidiasis, unspecified: Secondary | ICD-10-CM | POA: Diagnosis not present

## 2024-07-21 DIAGNOSIS — J019 Acute sinusitis, unspecified: Secondary | ICD-10-CM | POA: Diagnosis not present

## 2024-07-22 MED ORDER — FLUCONAZOLE 150 MG PO TABS: 150.0000 mg | ORAL_TABLET | ORAL | 0 refills | Status: DC | PRN

## 2024-07-22 MED ORDER — FLUTICASONE PROPIONATE 50 MCG/ACT NA SUSP
2.0000 | Freq: Every day | NASAL | 0 refills | Status: AC
Start: 2024-07-22 — End: ?

## 2024-07-22 MED ORDER — AMOXICILLIN-POT CLAVULANATE 875-125 MG PO TABS: 1.0000 | ORAL_TABLET | Freq: Two times a day (BID) | ORAL | 0 refills | Status: DC

## 2024-07-22 NOTE — Progress Notes (Signed)
 E-Visit for Sinus Problems  We are sorry that you are not feeling well.  Here is how we plan to help!  Based on what you have shared with me it looks like you have sinusitis.  Sinusitis is inflammation and infection in the sinus cavities of the head.  Based on your presentation I believe you most likely have Acute Bacterial Sinusitis.  This is an infection caused by bacteria and is treated with antibiotics. I have prescribed Augmentin  875mg /125mg  one tablet twice daily with food, for 7 days. and I have also prescribed Flonase  Nasal Spray Use 2 sprays in each nostril daily for 10-14 days You may use an oral decongestant such as Mucinex D or if you have glaucoma or high blood pressure use plain Mucinex. Saline nasal spray help and can safely be used as often as needed for congestion.  If you develop worsening sinus pain, fever or notice severe headache and vision changes, or if symptoms are not better after completion of antibiotic, please schedule an appointment with a health care provider.    Sinus infections are not as easily transmitted as other respiratory infection, however we still recommend that you avoid close contact with loved ones, especially the very young and elderly.  Remember to wash your hands thoroughly throughout the day as this is the number one way to prevent the spread of infection!  Diflucan given as prophylaxis as patient tends to get vaginal yeast infections with antibiotic use.  Home Care: Only take medications as instructed by your medical team. Complete the entire course of an antibiotic. Do not take these medications with alcohol. A steam or ultrasonic humidifier can help congestion.  You can place a towel over your head and breathe in the steam from hot water  coming from a faucet. Avoid close contacts especially the very young and the elderly. Cover your mouth when you cough or sneeze. Always remember to wash your hands.  Get Help Right Away If: You develop  worsening fever or sinus pain. You develop a severe head ache or visual changes. Your symptoms persist after you have completed your treatment plan.  Make sure you Understand these instructions. Will watch your condition. Will get help right away if you are not doing well or get worse.  Your e-visit answers were reviewed by a board certified advanced clinical practitioner to complete your personal care plan.  Depending on the condition, your plan could have included both over the counter or prescription medications.  If there is a problem please reply  once you have received a response from your provider.  Your safety is important to us .  If you have drug allergies check your prescription carefully.    You can use MyChart to ask questions about today's visit, request a non-urgent call back, or ask for a work or school excuse for 24 hours related to this e-Visit. If it has been greater than 24 hours you will need to follow up with your provider, or enter a new e-Visit to address those concerns.  You will get an e-mail in the next two days asking about your experience.  I hope that your e-visit has been valuable and will speed your recovery. Thank you for using e-visits.  I have spent 5 minutes in review of e-visit questionnaire, review and updating patient chart, medical decision making and response to patient.   Delon CHRISTELLA Dickinson, PA-C

## 2024-07-30 ENCOUNTER — Other Ambulatory Visit: Payer: Self-pay | Admitting: *Deleted

## 2024-07-30 DIAGNOSIS — E039 Hypothyroidism, unspecified: Secondary | ICD-10-CM

## 2024-07-31 ENCOUNTER — Ambulatory Visit

## 2024-07-31 ENCOUNTER — Other Ambulatory Visit

## 2024-07-31 ENCOUNTER — Inpatient Hospital Stay

## 2024-07-31 ENCOUNTER — Inpatient Hospital Stay: Attending: Hematology

## 2024-07-31 ENCOUNTER — Ambulatory Visit: Admitting: Physician Assistant

## 2024-07-31 DIAGNOSIS — N1831 Chronic kidney disease, stage 3a: Secondary | ICD-10-CM

## 2024-07-31 DIAGNOSIS — M069 Rheumatoid arthritis, unspecified: Secondary | ICD-10-CM | POA: Insufficient documentation

## 2024-07-31 DIAGNOSIS — D638 Anemia in other chronic diseases classified elsewhere: Secondary | ICD-10-CM | POA: Insufficient documentation

## 2024-07-31 LAB — CBC
HCT: 33.6 % — ABNORMAL LOW (ref 36.0–46.0)
Hemoglobin: 10.5 g/dL — ABNORMAL LOW (ref 12.0–15.0)
MCH: 29.3 pg (ref 26.0–34.0)
MCHC: 31.3 g/dL (ref 30.0–36.0)
MCV: 93.9 fL (ref 80.0–100.0)
Platelets: 310 K/uL (ref 150–400)
RBC: 3.58 MIL/uL — ABNORMAL LOW (ref 3.87–5.11)
RDW: 14.6 % (ref 11.5–15.5)
WBC: 9.8 K/uL (ref 4.0–10.5)
nRBC: 0 % (ref 0.0–0.2)

## 2024-07-31 LAB — SAMPLE TO BLOOD BANK

## 2024-07-31 NOTE — Progress Notes (Signed)
 No Retacrit  today per providers order parameters for HGB of 10.5.

## 2024-08-13 ENCOUNTER — Other Ambulatory Visit: Payer: Self-pay | Admitting: *Deleted

## 2024-08-22 ENCOUNTER — Telehealth: Admitting: Physician Assistant

## 2024-08-22 DIAGNOSIS — A084 Viral intestinal infection, unspecified: Secondary | ICD-10-CM | POA: Diagnosis not present

## 2024-08-22 MED ORDER — ONDANSETRON 4 MG PO TBDP: 4.0000 mg | ORAL_TABLET | Freq: Three times a day (TID) | ORAL | 0 refills | Status: AC | PRN

## 2024-08-22 NOTE — Progress Notes (Signed)
 We are sorry that you are not feeling well. Here is how we plan to help!  Based on what you have shared with me it looks like you have a Virus that is irritating your GI tract.  Vomiting is the forceful emptying of a portion of the stomach's content through the mouth.  Although nausea and vomiting can make you feel miserable, it's important to remember that these are not diseases, but rather symptoms of an underlying illness.  When we treat short term symptoms, we always caution that any symptoms that persist should be fully evaluated in a medical office.  I have prescribed a medication that will help alleviate your symptoms and allow you to stay hydrated:  Zofran 4 mg 1 tablet every 8 hours as needed for nausea and vomiting  HOME CARE: Drink clear liquids.  This is very important! Dehydration (the lack of fluid) can lead to a serious complication.  Start off with 1 tablespoon every 5 minutes for 8 hours. You may begin eating bland foods after 8 hours without vomiting.  Start with saltine crackers, white bread, rice, mashed potatoes, applesauce. After 48 hours on a bland diet, you may resume a normal diet. Try to go to sleep.  Sleep often empties the stomach and relieves the need to vomit.  GET HELP RIGHT AWAY IF:  Your symptoms do not improve or worsen within 2 days after treatment. You have a fever for over 3 days. You cannot keep down fluids after trying the medication.  MAKE SURE YOU:  Understand these instructions. Will watch your condition. Will get help right away if you are not doing well or get worse.   Thank you for choosing an e-visit. Your e-visit answers were reviewed by a board certified advanced clinical practitioner to complete your personal care plan. Depending upon the condition, your plan could have included both over the counter or prescription medications. Please review your pharmacy choice. Be sure that the pharmacy you have chosen is open so that you can pick up  your prescription now.  If there is a problem you may message your provider in MyChart to have the prescription routed to another pharmacy. Your safety is important to us . If you have drug allergies check your prescription carefully.  For the next 24 hours, you can use MyChart to ask questions about today's visit, request a non-urgent call back, or ask for a work or school excuse from your e-visit provider. You will get an e-mail in the next two days asking about your experience. I hope that your e-visit has been valuable and will speed your recovery.  I have spent 5 minutes in review of e-visit questionnaire, review and updating patient chart, medical decision making and response to patient.   Elsie Velma Lunger, PA-C

## 2024-08-28 ENCOUNTER — Inpatient Hospital Stay (HOSPITAL_COMMUNITY)
Admission: EM | Admit: 2024-08-28 | Discharge: 2024-09-01 | DRG: 871 | Disposition: A | Attending: Internal Medicine | Admitting: Internal Medicine

## 2024-08-28 ENCOUNTER — Emergency Department (HOSPITAL_COMMUNITY)

## 2024-08-28 ENCOUNTER — Other Ambulatory Visit: Payer: Self-pay

## 2024-08-28 DIAGNOSIS — E039 Hypothyroidism, unspecified: Secondary | ICD-10-CM | POA: Diagnosis not present

## 2024-08-28 DIAGNOSIS — N12 Tubulo-interstitial nephritis, not specified as acute or chronic: Principal | ICD-10-CM | POA: Diagnosis present

## 2024-08-28 DIAGNOSIS — A419 Sepsis, unspecified organism: Secondary | ICD-10-CM | POA: Diagnosis not present

## 2024-08-28 LAB — URINALYSIS, W/ REFLEX TO CULTURE (INFECTION SUSPECTED)
Bilirubin Urine: NEGATIVE
Glucose, UA: >=500 mg/dL — AB
Ketones, ur: 5 mg/dL — AB
Nitrite: NEGATIVE
Protein, ur: 100 mg/dL — AB
Specific Gravity, Urine: 1.012 (ref 1.005–1.030)
WBC, UA: >50 WBC/hpf (ref 0–5)
pH: 5 (ref 5.0–8.0)

## 2024-08-28 LAB — COMPREHENSIVE METABOLIC PANEL WITH GFR
ALT: 8 U/L (ref 0–44)
AST: 21 U/L (ref 15–41)
Albumin: 3.4 g/dL — ABNORMAL LOW (ref 3.5–5.0)
Alkaline Phosphatase: 116 U/L (ref 38–126)
Anion gap: 18 — ABNORMAL HIGH (ref 5–15)
BUN: 25 mg/dL — ABNORMAL HIGH (ref 8–23)
CO2: 17 mmol/L — ABNORMAL LOW (ref 22–32)
Calcium: 8.8 mg/dL — ABNORMAL LOW (ref 8.9–10.3)
Chloride: 97 mmol/L — ABNORMAL LOW (ref 98–111)
Creatinine, Ser: 2.26 mg/dL — ABNORMAL HIGH (ref 0.44–1.00)
GFR, Estimated: 23 mL/min — ABNORMAL LOW (ref >60)
Glucose, Bld: 115 mg/dL — ABNORMAL HIGH (ref 70–99)
Potassium: 5 mmol/L (ref 3.5–5.1)
Sodium: 132 mmol/L — ABNORMAL LOW (ref 135–145)
Total Bilirubin: 0.7 mg/dL (ref 0.0–1.2)
Total Protein: 6.4 g/dL — ABNORMAL LOW (ref 6.5–8.1)

## 2024-08-28 LAB — CBC WITH DIFFERENTIAL/PLATELET
Abs Immature Granulocytes: 0.13 K/uL — ABNORMAL HIGH (ref 0.00–0.07)
Basophils Absolute: 0 K/uL (ref 0.0–0.1)
Basophils Relative: 0 %
Eosinophils Absolute: 0 K/uL (ref 0.0–0.5)
Eosinophils Relative: 0 %
HCT: 31.7 % — ABNORMAL LOW (ref 36.0–46.0)
Hemoglobin: 9.6 g/dL — ABNORMAL LOW (ref 12.0–15.0)
Immature Granulocytes: 1 %
Lymphocytes Relative: 6 %
Lymphs Abs: 0.9 K/uL (ref 0.7–4.0)
MCH: 28.3 pg (ref 26.0–34.0)
MCHC: 30.3 g/dL (ref 30.0–36.0)
MCV: 93.5 fL (ref 80.0–100.0)
Monocytes Absolute: 1.3 K/uL — ABNORMAL HIGH (ref 0.1–1.0)
Monocytes Relative: 9 %
Neutro Abs: 11.4 K/uL — ABNORMAL HIGH (ref 1.7–7.7)
Neutrophils Relative %: 84 %
Platelets: 330 K/uL (ref 150–400)
RBC: 3.39 MIL/uL — ABNORMAL LOW (ref 3.87–5.11)
RDW: 15 % (ref 11.5–15.5)
WBC: 13.7 K/uL — ABNORMAL HIGH (ref 4.0–10.5)
nRBC: 0 % (ref 0.0–0.2)

## 2024-08-28 LAB — LACTIC ACID, PLASMA: Lactic Acid, Venous: 1.1 mmol/L (ref 0.5–1.9)

## 2024-08-28 MED ORDER — ENOXAPARIN SODIUM 30 MG/0.3ML IJ SOSY
30.0000 mg | PREFILLED_SYRINGE | INTRAMUSCULAR | Status: DC
  Administered 2024-08-29: 30 mg via SUBCUTANEOUS
  Filled 2024-08-28 (×2): qty 0.3

## 2024-08-28 MED ORDER — GABAPENTIN 300 MG PO CAPS
300.0000 mg | ORAL_CAPSULE | Freq: Two times a day (BID) | ORAL | Status: DC
  Administered 2024-08-28 – 2024-09-01 (×8): 300 mg via ORAL
  Filled 2024-08-28 (×8): qty 1

## 2024-08-28 MED ORDER — SODIUM CHLORIDE 0.9 % IV SOLN
2.0000 g | Freq: Once | INTRAVENOUS | Status: AC
  Administered 2024-08-28: 2 g via INTRAVENOUS
  Filled 2024-08-28: qty 20

## 2024-08-28 MED ORDER — PROCHLORPERAZINE EDISYLATE 10 MG/2ML IJ SOLN: 5.0000 mg | Freq: Four times a day (QID) | INTRAMUSCULAR | Status: DC | PRN

## 2024-08-28 MED ORDER — SODIUM CHLORIDE 0.9 % IV SOLN: INTRAVENOUS | Status: AC

## 2024-08-28 MED ORDER — MELATONIN 5 MG PO TABS: 5.0000 mg | ORAL_TABLET | Freq: Every evening | ORAL | Status: DC | PRN

## 2024-08-28 MED ORDER — ACETAMINOPHEN 325 MG PO TABS
650.0000 mg | ORAL_TABLET | Freq: Once | ORAL | Status: AC
  Administered 2024-08-28: 650 mg via ORAL
  Filled 2024-08-28: qty 2

## 2024-08-28 MED ORDER — LEVOTHYROXINE SODIUM 125 MCG PO TABS
125.0000 ug | ORAL_TABLET | Freq: Every morning | ORAL | Status: DC
  Administered 2024-08-29 – 2024-09-01 (×4): 125 ug via ORAL
  Filled 2024-08-28 (×4): qty 1

## 2024-08-28 MED ORDER — SODIUM CHLORIDE 0.9 % IV BOLUS
1000.0000 mL | Freq: Once | INTRAVENOUS | Status: AC
  Administered 2024-08-28: 1000 mL via INTRAVENOUS

## 2024-08-28 MED ORDER — VENLAFAXINE HCL ER 37.5 MG PO CP24
37.5000 mg | ORAL_CAPSULE | Freq: Every day | ORAL | Status: DC
  Administered 2024-08-29: 37.5 mg via ORAL
  Filled 2024-08-28 (×2): qty 1

## 2024-08-28 MED ORDER — OXYCODONE HCL 5 MG PO TABS: 5.0000 mg | ORAL_TABLET | Freq: Four times a day (QID) | ORAL | Status: DC | PRN

## 2024-08-28 MED ORDER — ACETAMINOPHEN 500 MG PO TABS
500.0000 mg | ORAL_TABLET | Freq: Four times a day (QID) | ORAL | Status: DC | PRN
  Administered 2024-08-29 (×2): 500 mg via ORAL
  Filled 2024-08-28 (×2): qty 1

## 2024-08-28 MED ORDER — POLYETHYLENE GLYCOL 3350 17 G PO PACK: 17.0000 g | PACK | Freq: Every day | ORAL | Status: DC | PRN

## 2024-08-28 MED ORDER — ROSUVASTATIN CALCIUM 10 MG PO TABS
10.0000 mg | ORAL_TABLET | Freq: Every day | ORAL | Status: DC
  Administered 2024-08-29 – 2024-09-01 (×4): 10 mg via ORAL
  Filled 2024-08-28 (×4): qty 1

## 2024-08-28 NOTE — H&P (Incomplete)
 History and Physical  Erica Beasley FMW:[id number] DOB: 1956-03-20 DOA: 08/28/2024  Referring physician: Dr. Suzette, EDP  PCP: Zollie Lowers, MD  Outpatient Specialists: Medical oncology. Patient coming from: Home.  Chief Complaint: Nausea and vomiting for almost 2 weeks  HPI: Erica Beasley is a 68 y.o. 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  She had an e-visit with her PCP a few days ago and was prescribed Zofran  without relief.  She has not been able to tolerate feedings for the past few days.  She presents to the ER for further evaluation.  In the ER, tachypneic with respiration rate of 23.  UA was positive for pyuria.  Lab studies were notable for leukocytosis 13.7, neutrophilia 11.4, bands.  Low sodium level 132, serum bicarb 17, anion gap 18.  CT scan abdomen pelvis without contrast revealed 6 mm noncalcified subpleural pulmonary nodule in the right lung base, new since prior examination.  Recommend a noncontrast chest CT at 6 to 12 months.  Gallbladder wall thickening without superimposed acute pericholecystic inflammatory changes.  Mild asymmetric right perinephric stranding.  Moderate nondependent gas within the bladder lumen could be seen in the setting of infectious cystitis/ascending urinary tract infection and correlation with urinalysis and urine culture may be helpful for further management.  Due to concern for UTI/right pyelonephritis, the patient received Rocephin  2 g x 1 in the ER.  Also received IV fluid boluses NS x 2.  TRH, hospitalist service, was asked to admit for further management.  Admitted for pyelonephritis, AKI, and electrolyte disturbances.  ED Course: Temperature 99.2.  BP 105/58, pulse 65, respiratory rate 21, O2  saturation 98% on room air.  Review of Systems: Review of systems as noted in the HPI. All other systems reviewed and are negative.   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 Tobie: mild diverticulsis in sigmoid colon.    COLONOSCOPY WITH PROPOFOL  N/A 09/16/2019   Rourk: Diverticulosis, random colon biopsies negative for microscopic colitis.   COLONOSCOPY WITH PROPOFOL  N/A 06/08/2020   Procedure: COLONOSCOPY WITH PROPOFOL ;  Surgeon: Shaaron Lamar HERO, MD;  Location: [facility];  Service: Endoscopy;  Laterality:  N/A;  9:15am   ESOPHAGOGASTRODUODENOSCOPY (EGD) WITH PROPOFOL  N/A 04/28/2020   Procedure: ESOPHAGOGASTRODUODENOSCOPY (EGD) WITH PROPOFOL ;  Surgeon: Shaaron Lamar HERO, MD;  Location: [facility];  Service: Endoscopy;  Laterality: N/A;  2:15   GIVENS CAPSULE STUDY N/A 08/12/2022   Procedure: GIVENS CAPSULE STUDY;  Surgeon: Shaaron Lamar HERO, MD;  [facility];  Service: Endoscopy;  Laterality: N/A;   YAG LASER APPLICATION Left 07/27/2015   Procedure: YAG LASER APPLICATION;  Surgeon: Dow JULIANNA Burke, MD;  Location: [facility];  Service: Ophthalmology;  Laterality: Left;    Social History:  reports that she quit smoking about 6 years ago. Her smoking use included cigarettes. She started smoking about 21 years ago. She has a 15 pack-year smoking history. She has been exposed to tobacco smoke. She has quit using smokeless tobacco. She reports that she does not drink alcohol and does not use drugs.   Allergies  Allergen Reactions   Adalimumab     Other reaction(s): no response Pt is unaware of this Other reaction(s): no response   Methotrexate Other (See Comments)    Other reaction(s): GI side effects   Sulfasalazine      Other Reaction(s): Anemia   Upadacitinib Other (See Comments)    Other reaction(s): GI side effects    Family History  Problem Relation Age of Onset   Stroke Mother    Heart disease Mother    Diabetes Mother    Breast cancer Mother    Other Father        stomach taken out for some reason   Migraines Daughter    Colon cancer Neg Hx       Prior to Admission medications   Medication Sig Start Date End Date Taking? Authorizing Provider  albuterol  (VENTOLIN  HFA) 108 (90 Base) MCG/ACT inhaler Inhale 2 puffs into the lungs every 6 (six) hours as needed for wheezing or shortness of breath.    [provider]  alendronate  (FOSAMAX ) 70 MG tablet Take 1 tablet (70 mg total) by mouth every 7 (seven) days. Take with a full glass of water  on an empty stomach. 06/10/24   Zollie Lowers, MD  amoxicillin -clavulanate (AUGMENTIN ) 875-125 MG tablet Take 1 tablet by mouth 2 (two) times daily. 07/22/24   Vivienne Delon HERO, PA-C  aspirin  EC 81 MG tablet Take 1 tablet (81 mg total) by mouth daily. Swallow whole. 07/11/22   Shahmehdi, Adriana LABOR, MD  Blood Glucose Monitoring Suppl DEVI 1 each by Does not apply route in the morning,  at noon, and at bedtime. May substitute to any manufacturer covered by patient's insurance. 11/30/22   Zollie Lowers, MD  calcium  carbonate (OSCAL) 1500 (600 Ca) MG TABS tablet Take 1,500 mg by mouth 2 (two) times daily with a meal.    [provider]  candesartan (ATACAND) 4 MG tablet Take 4 mg by mouth daily.    [provider]  colchicine 0.6 MG tablet Take 0.6 mg by mouth daily. 06/21/23   [provider]  Cyanocobalamin  (B-12) 500 MCG TABS Take 500 mcg by mouth every other day. 04/25/23   Lamon Pleasant HERO, PA-C  desvenlafaxine  (PRISTIQ ) 50 MG 24 hr tablet Take 1 tablet (50 mg total) by mouth daily. 11/27/23   Zollie Lowers, MD  etodolac (LODINE) 300 MG capsule Take 300 mg by mouth 2 (two) times daily. 06/21/23   [provider]  fluconazole  (DIFLUCAN ) 150 MG tablet Take 1 tablet (150 mg total) by mouth every 3 (three) days  as needed. 07/22/24   Vivienne Delon HERO, PA-C  fluticasone  (FLONASE ) 50 MCG/ACT nasal spray Place 2 sprays into both nostrils daily. 07/22/24   Vivienne Delon HERO, PA-C  folic acid  (FOLVITE ) 1 MG tablet TAKE ONE TABLET ONCE DAILY 12/11/23   Rogers Hai, MD  gabapentin  (NEURONTIN ) 300 MG capsule Take 2 capsules (600 mg total) by mouth 2 (two) times daily. 02/28/24   Zollie Lowers, MD  glucose blood (ACCU-CHEK GUIDE TEST) test strip Check BS twice daily before meals Dx R73.9 02/05/24   Zollie Lowers, MD  HYDROcodone -acetaminophen  (NORCO) 7.5-325 MG tablet TAKE 1 TABLET EVERY 6 HOURS AS NEEDED FOR MODERATE PAIN    [provider]  hydroxychloroquine  (PLAQUENIL ) 200 MG tablet Take 200 mg by mouth 2 (two) times daily.    [provider]  JARDIANCE  10 MG TABS tablet TAKE ONE TABLET ONCE DAILY FOR DIABETES 08/13/24   Zollie Lowers, MD  leflunomide  (ARAVA ) 20 MG tablet TAKE 1 TABLET ONCE A DAY FOR RHEUMATOID ARTHRITIS    [provider]  levothyroxine  (SYNTHROID ) 125 MCG tablet TAKE ONE TABLET EVERY MORNING  07/30/24   Zollie Lowers, MD  liothyronine  (CYTOMEL ) 5 MCG tablet TAKE TWO TABLETS BY MOUTH DAILY 08/13/24   Zollie Lowers, MD  megestrol  (MEGACE ) 40 MG/ML suspension TAKE 10 ML BY MOUTH TWICE DAILY 03/18/24   Rogers Hai, MD  metFORMIN  (GLUCOPHAGE -XR) 750 MG 24 hr tablet Take 1 tablet (750 mg total) by mouth 2 (two) times daily with a meal. 11/27/23   Zollie Lowers, MD  ondansetron  (ZOFRAN -ODT) 4 MG disintegrating tablet Take 1 tablet (4 mg total) by mouth every 8 (eight) hours as needed for nausea or vomiting. 08/22/24   Gladis Elsie BROCKS, PA-C  oxyCODONE -acetaminophen  (PERCOCET/ROXICET) 5-325 MG tablet Take 1 tablet by mouth every 6 (six) hours as needed for severe pain (pain score 7-10).    [provider]  Pancrelipase , Lip-Prot-Amyl, (CREON ) 24000-76000 units CPEP TAKE 2 CAPSULES WITH MEALS AND 1 CAPSULE WITH SNACKS    [provider]  pantoprazole  (PROTONIX ) 40 MG tablet TAKE ONE TABLET BY MOUTH DAILY BEFORE BREAKFAST    [provider]  potassium chloride  SA (KLOR-CON  M) 20 MEQ tablet Take 1 tablet (20 mEq total) by mouth daily. 06/10/24   Lamon Pleasant HERO, PA-C  predniSONE  (DELTASONE ) 5 MG tablet Take 5-10 mg by mouth daily. 05/08/23   [provider]  rosuvastatin  (CRESTOR ) 40 MG tablet Take 1 tablet (40 mg total) by mouth daily. 07/24/23   Zollie Lowers, MD  traZODone  (DESYREL ) 100 MG tablet Take 1 tablet (100 mg total) by mouth at bedtime as needed. for sleep 11/27/23   Zollie Lowers, MD    Physical Exam: BP (!) 105/58   Pulse 65   Temp 99.2 F (37.3 C) (Oral)   Resp 20   Ht 5' 4 (1.626 m)   Wt 49.1 kg   SpO2 98%   BMI 18.58 kg/m   General: 68 y.o. year-old female well developed well nourished in no acute distress.  Alert and oriented x3. Cardiovascular: Regular rate and rhythm with no rubs or gallops.  No thyromegaly or JVD noted.  No lower extremity edema. 2/4 pulses in all 4 extremities. Respiratory: Clear to auscultation with  no wheezes or rales. Good inspiratory effort. Abdomen: Soft nontender nondistended with normal bowel sounds x4 quadrants. Muskuloskeletal: No cyanosis, clubbing or edema noted bilaterally Neuro: CN II-XII intact, strength, sensation, reflexes Skin: No ulcerative lesions noted or rashes Psychiatry: Judgement and insight appear normal. Mood  is appropriate for condition and setting          Labs on Admission:  Basic Metabolic Panel: Recent Labs  Lab 08/28/24 1855  NA 132*  K 5.0  CL 97*  CO2 17*  GLUCOSE 115*  BUN 25*  CREATININE 2.26*  CALCIUM  8.8*   Liver Function Tests: Recent Labs  Lab 08/28/24 1855  AST 21  ALT 8  ALKPHOS 116  BILITOT 0.7  PROT 6.4*  ALBUMIN 3.4*   No results for input(s): LIPASE, AMYLASE in the last 168 hours. No results for input(s): AMMONIA in the last 168 hours. CBC: Recent Labs  Lab 08/28/24 1855  WBC 13.7*  NEUTROABS 11.4*  HGB 9.6*  HCT 31.7*  MCV 93.5  PLT 330   Cardiac Enzymes: No results for input(s): CKTOTAL, CKMB, CKMBINDEX, TROPONINI in the last 168 hours.  BNP (last 3 results) No results for input(s): BNP in the last 8760 hours.  ProBNP (last 3 results) No results for input(s): PROBNP in the last 8760 hours.  CBG: No results for input(s): GLUCAP in the last 168 hours.  Radiological Exams on Admission: CT ABDOMEN PELVIS WO CONTRAST Result Date: 08/28/2024 EXAM: CT ABDOMEN AND PELVIS WITHOUT CONTRAST 08/28/2024 08:14:00 PM TECHNIQUE: CT of the abdomen and pelvis was performed without the administration of intravenous contrast. Multiplanar reformatted images are provided for review. Automated exposure control, iterative reconstruction, and/or weight-based adjustment of the mA/kV was utilized to reduce the radiation dose to as low as reasonably achievable. COMPARISON: 05/11/2020. CT chest of 01/04/2023. CLINICAL HISTORY: Abdominal pain, acute, nonlocalized. FINDINGS: LOWER CHEST: 6 mm noncalcified subpleural  pulmonary nodule noted within the visualized right lung base, indeterminate, but new since prior examination and CT chest of 01/04/2023. Per Fleischner Society Guidelines recommend a non-contrast chest CT at 6-12 months; if patient is high risk for malignancy, consider an additional non-contrast chest CT at 18-24 months. If patient is low risk for malignancy, non-contrast chest CT at 18-24 months is optional. LIVER: The liver is unremarkable. GALLBLADDER AND BILE DUCTS: There is gallbladder wall thickening noted, a nonspecific finding but can be seen with hydrops, cholesterolosis, or chronic inflammatory change. No superimposed acute pericholecystic inflammatory changes are identified. No biliary ductal dilatation. SPLEEN: 7 mm hypodense nodule within the inferior pole of the spleen is new from the prior examination. It is indeterminate, possibly representing a small cyst or hemangioma. PANCREAS: No acute abnormality. ADRENAL GLANDS: No acute abnormality. KIDNEYS, URETERS AND BLADDER: The kidneys are normal in size and position. Mild asymmetric right perinephric stranding is present, which may relate to an asymmetric inflammatory process, as can be seen with pyelonephritis. There is minimal right hydronephrosis. No intrarenal or ureteral calculi. The left kidney is unremarkable. There is moderate nondependent gas within the bladder lumen, finding that may relate to recent instrumentation or infection. The bladder is decompressed. Correlation with urinalysis and urine culture may be helpful for further management. GI AND BOWEL: Mild sigmoid diverticulosis. The stomach, small bowel, and large bowel are otherwise unremarkable. The appendix is normal. There is no bowel obstruction. PERITONEUM AND RETROPERITONEUM: No ascites. No free air. VASCULATURE: Extensive right coronary artery calcification. Extensive aortoiliac atherosclerotic calcifications. Aorta is normal in caliber. No aortic aneurysm. LYMPH NODES: No  lymphadenopathy. REPRODUCTIVE ORGANS: No acute abnormality. BONES AND SOFT TISSUES: Small fat-containing left inguinal hernia. Osseous structures are age appropriate. No acute bone abnormality. No lytic or blastic bone lesion. IMPRESSION: 1. 6 mm noncalcified subpleural pulmonary nodule in the right lung base, new since  prior examination; per Fleischner Society Guidelines, recommend a non-contrast chest CT at 612 months, and if high risk for malignancy consider an additional non-contrast chest CT at 1824 months; if low risk for malignancy, non-contrast chest CT at 1824 months is optional. 2. Gallbladder wall thickening without superimposed acute pericholecystic inflammatory changes. See differential considerations above. Correlation with liver function tests may be helpful. 3. Mild asymmetric right perinephric stranding. Moderate nondependent gas within the bladder lumen. The finding could be seen in the setting of infectious cystitis/ascending urinary tract infection and correlation with urinalysis and urine culture may be helpful for further management. Electronically signed by: Dorethia Molt MD 08/28/2024 09:14 PM EST RP Workstation: [id number]   DG Chest 2 View Result Date: 08/28/2024 CLINICAL DATA:  Weakness. EXAM: CHEST - 2 VIEW COMPARISON:  01/04/2023 FINDINGS: Lung volumes are low. The heart is normal in size for technique. Mediastinal contours are normal. Coarsened lung markings which appear chronic. No confluent opacity, pleural effusion or pneumothorax. Mild gaseous distension of included upper abdominal bowel loops. Bilateral shoulder arthropathy. IMPRESSION: Low lung volumes without acute abnormality. Electronically Signed   By: Andrea Gasman M.D.   On: 08/28/2024 18:23    EKG: I independently viewed the EKG done and my findings are as followed: Sinus rhythm rate of 84.  Nonspecific ST-T changes.  QTc 521.  Assessment/Plan Present on Admission:  Pyelonephritis  Principal Problem:    Pyelonephritis  Sepsis secondary to right pyelonephritis, POA Urine analysis positive for pyuria Findings on CT scan suggestive of right pyelonephritis Follow urine culture and peripheral blood cultures x 2 Continue Rocephin empirically Monitor cultures for ID and sensitivities De-escalate antibiotics when able Monitor fever curve and WBCs Maintain MAP greater than 65  AKI, suspect prerenal in the setting of dehydration from vomiting Normal renal function at baseline Presented with creatinine of 2.26 with GFR of 23 For nephrotoxic agents, dehydration, and hypotension Monitor urine output Repeat BMP in the morning.  Hypothyroidism Resume home levothyroxine   Hyperlipidemia Resume home Crestor   Diabetes and diabetic polyneuropathy Resume home regimen Hold off home oral hypoglycemics.  High anion gap metabolic acidosis Serum bicarb 17, anion gap 18 Continue IV fluid hydration Repeat BMP in the morning  Anemia of chronic disease Hemoglobin 9.6 with MCV of 93 Continue to monitor H&H   Critical care time: 55 minutes.   DVT prophylaxis: Subcu Lovenox daily  Code Status: Full code.  Family Communication: None at bedside.  Disposition Plan: Admitted to telemetry unit.  Consults called: None.  Admission status: Patient status.   Status is: Inpatient The patient requires at least 2 midnights for further evaluation and treatment of present condition.   Terry LOISE Hurst MD [facility] Pager [phone]  If 7PM-7AM, please contact night-coverage [email or web address] Password [facility]  08/28/2024, 10:25 PM

## 2024-08-28 NOTE — ED Triage Notes (Signed)
 Pt arrived via POV c/o emesis since last Thursday and reports having an E-Visit with her PCP and being prescribed Zofran  w/o relief. Pt reports she has been unable to eat anything, and has developed tremors.

## 2024-08-29 ENCOUNTER — Ambulatory Visit: Payer: Self-pay | Admitting: Family Medicine

## 2024-08-29 DIAGNOSIS — N12 Tubulo-interstitial nephritis, not specified as acute or chronic: Secondary | ICD-10-CM | POA: Diagnosis not present

## 2024-08-29 LAB — BLOOD CULTURE ID PANEL (REFLEXED) - BCID2

## 2024-08-29 LAB — CBC
HCT: 26.4 % — ABNORMAL LOW (ref 36.0–46.0)
Hemoglobin: 8.1 g/dL — ABNORMAL LOW (ref 12.0–15.0)
MCH: 28.9 pg (ref 26.0–34.0)
MCHC: 30.7 g/dL (ref 30.0–36.0)
MCV: 94.3 fL (ref 80.0–100.0)
Platelets: 276 K/uL (ref 150–400)
RBC: 2.8 MIL/uL — ABNORMAL LOW (ref 3.87–5.11)
RDW: 15 % (ref 11.5–15.5)
WBC: 10.9 K/uL — ABNORMAL HIGH (ref 4.0–10.5)
nRBC: 0 % (ref 0.0–0.2)

## 2024-08-29 LAB — BASIC METABOLIC PANEL WITH GFR
Anion gap: 18 — ABNORMAL HIGH (ref 5–15)
BUN: 25 mg/dL — ABNORMAL HIGH (ref 8–23)
CO2: 14 mmol/L — ABNORMAL LOW (ref 22–32)
Calcium: 7.5 mg/dL — ABNORMAL LOW (ref 8.9–10.3)
Chloride: 106 mmol/L (ref 98–111)
Creatinine, Ser: 2.21 mg/dL — ABNORMAL HIGH (ref 0.44–1.00)
GFR, Estimated: 24 mL/min — ABNORMAL LOW (ref >60)
Glucose, Bld: 86 mg/dL (ref 70–99)
Potassium: 4.2 mmol/L (ref 3.5–5.1)
Sodium: 138 mmol/L (ref 135–145)

## 2024-08-29 LAB — GLUCOSE, CAPILLARY
Glucose-Capillary: 71 mg/dL (ref 70–99)
Glucose-Capillary: 88 mg/dL (ref 70–99)
Glucose-Capillary: 135 mg/dL — ABNORMAL HIGH (ref 70–99)
Glucose-Capillary: 196 mg/dL — ABNORMAL HIGH (ref 70–99)
Glucose-Capillary: 110 mg/dL — ABNORMAL HIGH (ref 70–99)

## 2024-08-29 LAB — MAGNESIUM: Magnesium: 1.5 mg/dL — ABNORMAL LOW (ref 1.7–2.4)

## 2024-08-29 LAB — PHOSPHORUS: Phosphorus: 3.5 mg/dL (ref 2.5–4.6)

## 2024-08-29 LAB — HIV ANTIBODY (ROUTINE TESTING W REFLEX): HIV Screen 4th Generation wRfx: NONREACTIVE

## 2024-08-29 MED ORDER — SODIUM CHLORIDE 0.9 % IV SOLN
2.0000 g | INTRAVENOUS | Status: DC
  Administered 2024-08-29 – 2024-08-31 (×3): 2 g via INTRAVENOUS
  Filled 2024-08-29 (×3): qty 12.5

## 2024-08-29 MED ORDER — VENLAFAXINE HCL ER 75 MG PO CP24
75.0000 mg | ORAL_CAPSULE | Freq: Every day | ORAL | Status: DC
  Administered 2024-08-30 – 2024-09-01 (×3): 75 mg via ORAL
  Filled 2024-08-29 (×3): qty 1

## 2024-08-29 MED ORDER — SODIUM CHLORIDE 0.9 % IV BOLUS
1000.0000 mL | Freq: Once | INTRAVENOUS | Status: AC
  Administered 2024-08-29: 1000 mL via INTRAVENOUS

## 2024-08-29 MED ORDER — MAGNESIUM SULFATE 2 GM/50ML IV SOLN
2.0000 g | Freq: Once | INTRAVENOUS | Status: AC
  Administered 2024-08-29: 2 g via INTRAVENOUS
  Filled 2024-08-29: qty 50

## 2024-08-29 MED ORDER — INSULIN ASPART 100 UNIT/ML IJ SOLN
0.0000 [IU] | Freq: Three times a day (TID) | INTRAMUSCULAR | Status: DC
  Administered 2024-08-29: 1 [IU] via SUBCUTANEOUS
  Administered 2024-08-29 – 2024-08-31 (×2): 2 [IU] via SUBCUTANEOUS
  Administered 2024-08-31 – 2024-09-01 (×3): 1 [IU] via SUBCUTANEOUS
  Filled 2024-08-29 (×6): qty 1

## 2024-08-29 MED ORDER — SODIUM CHLORIDE 0.9 % IV SOLN: 2.0000 g | INTRAVENOUS | Status: DC

## 2024-08-29 NOTE — Plan of Care (Signed)
  Problem: Acute Rehab OT Goals (only OT should resolve) Goal: Pt. Will Perform Grooming Flowsheets (Taken 08/29/2024 1134) Pt Will Perform Grooming:  with modified independence  standing Goal: Pt. Will Perform Lower Body Dressing Flowsheets (Taken 08/29/2024 1134) Pt Will Perform Lower Body Dressing: with modified independence Goal: Pt. Will Transfer To Toilet Flowsheets (Taken 08/29/2024 1134) Pt Will Transfer to Toilet:  with modified independence  ambulating Goal: Pt. Will Perform Toileting-Clothing Manipulation Flowsheets (Taken 08/29/2024 1134) Pt Will Perform Toileting - Clothing Manipulation and hygiene: with modified independence Goal: Pt/Caregiver Will Perform Home Exercise Program Flowsheets (Taken 08/29/2024 1134) Pt/caregiver will Perform Home Exercise Program:  Increased ROM  Increased strength  Right Upper extremity  Left upper extremity  Independently  Jaythen Hamme OT, MOT

## 2024-08-29 NOTE — TOC Initial Note (Signed)
 Transition of Care [facility]) - Initial/Assessment Note    Patient Details  Name: Erica Beasley MRN: [id number] Date of Birth: [birth year]  Transition [facility]) CM/SW Contact:    Hoy DELENA Bigness, LCSW Phone Number: 08/29/2024, 12:09 PM  Clinical Narrative:                 Pt from home with daughter. Pt has RW at home she uses at baseline. Pt/daughter agreeable to recommendation for [facility] and BSC. HHPT/OT has been arranged with [facility]. BSC has been ordered through [facility] to be delivered to pt's room.   Expected Discharge Plan: Home w Home Health Services Barriers to Discharge: Continued Medical Work up   Patient Goals and CMS Choice Patient states their goals for this hospitalization and ongoing recovery are:: To return home CMS Medicare.gov Compare Post Acute Care list provided to:: Patient Choice offered to / list presented to : Patient      Expected Discharge Plan and Services In-house Referral: Clinical Social Work Discharge Planning Services: NA Post Acute Care Choice: Home Health, Durable Medical Equipment Living arrangements for the past 2 months: Single Family Home                 DME Arranged: Bedside commode DME Agency: [employer] Date DME Agency Contacted: 08/29/24 Time DME Agency Contacted: 1208 Representative spoke with at DME Agency: [facility] HH Arranged: PT, OT HH Agency: [facility] Date [facility] Agency Contacted: 08/29/24 Time HH Agency Contacted: 1209    Prior Living Arrangements/Services Living arrangements for the past 2 months: Single Family Home Lives with:: Adult Children Patient language and need for interpreter reviewed:: Yes Do you feel safe going back to the place where you live?: Yes      Need for Family Participation in Patient Care: No (Comment) Care giver support system in place?: Yes (comment) Current home services: DME (RW) Criminal Activity/Legal Involvement Pertinent to Current Situation/Hospitalization: No - Comment as  needed  Activities of Daily Living   ADL Screening (condition at time of admission) Independently performs ADLs?: Yes (appropriate for developmental age) Is the patient deaf or have difficulty hearing?: No Does the patient have difficulty seeing, even when wearing glasses/contacts?: No Does the patient have difficulty concentrating, remembering, or making decisions?: No  Permission Sought/Granted Permission sought to share information with : Facility Medical Sales Representative, Family Supports Permission granted to share information with : Yes, Verbal Permission Granted     Permission granted to share info w AGENCY: HH and DME agencies        Emotional Assessment Appearance:: Appears stated age Attitude/Demeanor/Rapport: Engaged Affect (typically observed): Accepting Orientation: : Oriented to Self, Oriented to Place, Oriented to  Time, Oriented to Situation Alcohol / Substance Use: Not Applicable Psych Involvement: No (comment)  Admission diagnosis:  Pyelonephritis [N12] Patient Active Problem List   Diagnosis Date Noted   Pyelonephritis 08/28/2024   Diabetic lipidosis (HCC) 02/28/2024   Hyperglycemia 10/24/2022   Synovitis of left knee 08/12/2022   Chronic bilateral low back pain without sciatica 06/29/2022   Chronic right SI joint pain 06/29/2022   Gait difficulty 06/29/2022   IDA (iron deficiency anemia) 06/07/2022   Symptomatic anemia 05/12/2022   Osteoporosis 01/17/2022   Hypothyroidism 11/13/2020   B12 deficiency 11/13/2020   COPD without exacerbation (HCC) 11/13/2020   Vitamin D  deficiency 11/13/2020   Neuropathy 11/13/2020   Rheumatoid arthritis involving multiple sites (HCC) 11/13/2020   Mixed hyperlipidemia 11/13/2020   Aortic atherosclerosis 11/13/2020   GERD (gastroesophageal  reflux disease) 11/05/2020   Depression, recurrent 09/22/2020   Monoclonal gammopathy 04/03/2020   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 03/26/2020   Normocytic anemia  05/03/2019   Chronic diarrhea 01/29/2019   PCP:  Zollie Lowers, MD Pharmacy:   [facility] [place], [place] - [address] [place] [address] Phone: [phone] Fax: [phone]     Social Drivers of Health (SDOH) Social History: SDOH Screenings   Food Insecurity: No Food Insecurity (08/28/2024)  Housing: Low Risk  (08/28/2024)  Transportation Needs: No Transportation Needs (08/28/2024)  Utilities: Not At Risk (08/28/2024)  Alcohol Screen: Low Risk  (12/13/2022)  Depression (PHQ2-9): Low Risk  (06/27/2024)  Financial Resource Strain: Low Risk  (08/28/2024)  Physical Activity: Inactive (08/28/2024)  Social Connections: Socially Isolated (08/28/2024)  Stress: No Stress Concern Present (08/28/2024)  Tobacco Use: Medium Risk (08/28/2024)  Health Literacy: Adequate Health Literacy (04/10/2024)   SDOH Interventions: Social Connections Interventions: Inpatient TOC, Intervention Not Indicated   Readmission Risk Interventions     No data to display

## 2024-08-29 NOTE — Progress Notes (Signed)
 Patient having tremors noted to arms, vital signs obtained. Temperature 101.0. PRN Tylenol  administered and temperature reassessed 100.3. Respirations 28, patient in no distress. O2 sats 100% on RA. Erminio Cone, NP made aware. No medication or treatment changes ordered at this time.

## 2024-08-29 NOTE — Plan of Care (Signed)
  Problem: Acute Rehab PT Goals(only PT should resolve) Goal: Patient Will Transfer Sit To/From Stand Outcome: Progressing Flowsheets (Taken 08/29/2024 1026) Patient will transfer sit to/from stand: with supervision Goal: Pt Will Transfer Bed To Chair/Chair To Bed Outcome: Progressing Flowsheets (Taken 08/29/2024 1026) Pt will Transfer Bed to Chair/Chair to Bed: with supervision Goal: Pt Will Ambulate Outcome: Progressing Flowsheets (Taken 08/29/2024 1026) Pt will Ambulate:  100 feet  with supervision  with least restrictive assistive device Goal: Pt Will Go Up/Down Stairs Outcome: Progressing Flowsheets (Taken 08/29/2024 1026) Pt will Go Up / Down Stairs:  1-2 stairs  with least restrictive assistive device  with minimal assist

## 2024-08-29 NOTE — Evaluation (Signed)
 Physical Therapy Evaluation Patient Details Name: Erica Beasley MRN: [id number] DOB: June 04, 1956 Today's Date: 08/29/2024  History of Present Illness  Erica Beasley is a 68 y.o. 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  She had an e-visit with her PCP a few days ago and was prescribed Zofran  without relief.  She has not been able to tolerate feedings for the past few days.  Endorses episodes of back pain intermittently.  She presents to the ER for further evaluation.     In the ER, tachypneic with respiration rate of 23.  UA was positive for pyuria.  Lab studies were notable for leukocytosis 13.7, neutrophilia 11.4, bands.  Low sodium level 132, serum bicarb 17, anion gap 18.     CT scan abdomen pelvis without contrast revealed 6 mm noncalcified subpleural pulmonary nodule in the right lung base, new since prior examination.  Recommend a noncontrast chest CT at 6 to 12 months.  Gallbladder wall thickening without superimposed acute pericholecystic inflammatory changes.  Mild asymmetric right perinephric stranding.  Moderate nondependent gas within the bladder lumen could be seen in the setting of infectious cystitis/ascending urinary tract infection and correlation with urinalysis and urine culture may be helpful for further management.     Due to concern for UTI/right pyelonephritis, the patient received Rocephin  2 g x 1 in the ER.  Also received IV fluid boluses NS x 2.  TRH, hospitalist service, was asked to admit for further management.  Admitted for pyelonephritis, AKI, and electrolyte disturbances.    Clinical Impression  On therapists arrival; patient in bed; daughter at bedside. She is agreeable to therapists assessment.  Patient modified  independent with supine to sit; taking extra time and effort to come fully upright and sit on the edge of the bed.  Patient demonstrates good sitting balance on the edge of the bed.  Muscle testing of lower extremities reveals slight weakness left lower extremity versus the right.  Sit to stand to RW with CGA for safety.  Patient ambulates x 80 ft with RW and CGA and needs occasional min A with direction changes and turns as she tends to start to lose her balance and needs min A to recover.   Patient returns to bed and able to scoot in bed without assistance.  patient left in bed with call button in reach, bed alarm set, her daughter at bedside and nursing notified of mobility status.  Patient will benefit from continued skilled therapy services during the remainder of her hospital stay and at the next recommended venue of care to address deficits and promote return to optimal function.             If plan is discharge home, recommend the following: A little help with walking and/or transfers;A little help with bathing/dressing/bathroom;Help with stairs or ramp for entrance;Assistance with cooking/housework   Can travel by private vehicle        Equipment Recommendations None recommended by PT  Recommendations for Other Services       Functional Status Assessment Patient has had a recent decline in their functional status and demonstrates the ability to make significant improvements in function in a reasonable and predictable amount of time.     Precautions / Restrictions Precautions Precautions: Fall Recall of Precautions/Restrictions: Impaired Precaution/Restrictions Comments:  patient somewhat impulsive; per daughter has had 2 falls in the past 6 months Restrictions Weight Bearing Restrictions Per Provider Order: No      Mobility  Bed Mobility Overal bed mobility: Modified Independent             General bed mobility comments: takes extra time and effort Patient Response:  Cooperative  Transfers Overall transfer level: Needs assistance Equipment used: Rolling walker (2 wheels) Transfers: Sit to/from Stand Sit to Stand: Contact guard assist           General transfer comment: CGA for safety    Ambulation/Gait Ambulation/Gait assistance: Contact guard assist, Min assist Gait Distance (Feet): 80 Feet Assistive device: Rolling walker (2 wheels)   Gait velocity: decreased     General Gait Details: has some loss of balance with direction changes; turns; needs min A to correct  Stairs            Wheelchair Mobility     Tilt Bed Tilt Bed Patient Response: Cooperative  Modified Rankin (Stroke Patients Only)       Balance Overall balance assessment: Needs assistance Sitting-balance support: No upper extremity supported, Feet supported Sitting balance-Leahy Scale: Good Sitting balance - Comments: good sitting balance on edge of bed   Standing balance support: Bilateral upper extremity supported, During functional activity, Reliant on assistive device for balance Standing balance-Leahy Scale: Fair Standing balance comment: fair standing balance with RW and CGA from PT; needs occasional min A with walking to maintain balance                             Pertinent Vitals/Pain      Home Living Family/patient expects to be discharged to:: Private residence Living Arrangements: Children Available Help at Discharge: Available PRN/intermittently Type of Home: House Home Access: Stairs to enter                Prior Function                       Extremity/Trunk Assessment   Upper Extremity Assessment Upper Extremity Assessment: Defer to OT evaluation    Lower Extremity Assessment Lower Extremity Assessment: LLE deficits/detail LLE Deficits / Details: mild weakness left lower extremity versus right    Cervical / Trunk Assessment Cervical / Trunk Assessment: Normal  Communication    Communication Communication: Impaired Factors Affecting Communication: Hearing impaired    Cognition Arousal: Alert Behavior During Therapy: WFL for tasks assessed/performed   PT - Cognitive impairments: Safety/Judgement                         Following commands: Intact       Cueing       General Comments      Exercises     Assessment/Plan    PT Assessment Patient needs continued PT services  PT Problem List Decreased strength;Decreased activity tolerance;Decreased balance       PT Treatment Interventions DME instruction;Gait training;Stair training;Functional mobility training;Therapeutic activities;Therapeutic exercise;Balance training    PT Goals (Current goals can be found in the Care Plan section)  Acute Rehab PT Goals Patient Stated Goal: return home PT Goal Formulation: With patient/family Time For Goal Achievement: 09/12/24 Potential to Achieve Goals: Good    Frequency Min 2X/week     Co-evaluation PT/OT/SLP Co-Evaluation/Treatment: Yes Reason for Co-Treatment: To address functional/ADL transfers PT goals addressed during session: Mobility/safety with mobility;Balance;Proper  use of DME         AM-PAC PT 6 Clicks Mobility  Outcome Measure Help needed turning from your back to your side while in a flat bed without using bedrails?: None Help needed moving from lying on your back to sitting on the side of a flat bed without using bedrails?: None Help needed moving to and from a bed to a chair (including a wheelchair)?: A Little Help needed standing up from a chair using your arms (e.g., wheelchair or bedside chair)?: A Little Help needed to walk in hospital room?: A Little Help needed climbing 3-5 steps with a railing? : A Lot 6 Click Score: 19    End of Session Equipment Utilized During Treatment: Gait belt Activity Tolerance: Patient tolerated treatment well Patient left: in bed;with call bell/phone within reach;with bed alarm  set;with family/visitor present Nurse Communication: Mobility status PT Visit Diagnosis: Unsteadiness on feet (R26.81);Other abnormalities of gait and mobility (R26.89);History of falling (Z91.81);Muscle weakness (generalized) (M62.81)    Time: 9074-9054 PT Time Calculation (min) (ACUTE ONLY): 20 min   Charges:   PT Evaluation $PT Eval Moderate Complexity: 1 Mod   PT General Charges $$ ACUTE PT VISIT: 1 Visit        10:26 AM, 08/29/24 Emanuelle Bastos Small Theone Bowell MPT [place] [facility] [place] [phone] Ph:[phone]

## 2024-08-29 NOTE — Plan of Care (Signed)
   Problem: Education: Goal: Knowledge of General Education information will improve Description: Including pain rating scale, medication(s)/side effects and non-pharmacologic comfort measures Outcome: Progressing   Problem: Clinical Measurements: Goal: Ability to maintain clinical measurements within normal limits will improve Outcome: Progressing Goal: Diagnostic test results will improve Outcome: Progressing

## 2024-08-29 NOTE — Progress Notes (Addendum)
 PROGRESS NOTE    Erica Beasley  FMW:[id number] DOB: 05/31/1956 DOA: 08/28/2024 PCP: Zollie Lowers, MD   Brief Narrative:   68 y.o. 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  Diagnosed with sepsis likely secondary to acute right-sided pyonephritis and is currently on intravenous antibiotics.  She was also found to have acute kidney injury and she is on intravenous fluids.  She lives at home with her daughter and walks with the help of walker.  Assessment & Plan:  Principal Problem:   Pyelonephritis   Sepsis, POA:  Gram negative rods bacteremia,POA: Likely secondary to acute right-sided pyelonephritis.  Urinalysis positive for pyuria.  Findings on CT scan are suggestive of right-sided pyonephritis. Follow-up blood cultures and urine cultures and adjust antibiotics accordingly.  Started on intravenous Rocephin 2 g and changed to IV Cefepime because of bacteremia and intermittent fever while on IV Ceftriaxone.  Nausea and vomiting, POA: Likely secondary to acute pyelonephritis.  Improved.  Patient is currently on clear liquid diet.  Ordered a mechanical soft diet  Acute Kidney injury, likely prerenal as well as acute tubular necrosis in the setting of sepsis.  Creatinine of 2.26 with a GFR of 23 on arrival.  Creatinine has improved to 2.1 today.  Baseline creatinine is within normal limits.  Continue with intravenous fluids and follow-up BMP in the morning.  New pulmonary nodule in the right lung base, seen on CT scan, POA. CT scan abdomen pelvis without contrast revealed 6 mm noncalcified subpleural pulmonary nodule in the right lung base, new since prior examination.  Recommend a noncontrast chest CT at 6 to 12 months.  Hypothyroidism,POA:  Continue with synthroid   Hyperlipidemia,POA: Continue with crestor   Diabetes mellitus type II with polyneuropathy,POA: -Continue with SSI and gabapentin   Anemia of chronic disease,POA: -No acute issues  Physical deconditioning,POA: PT/OT consulted  Disposition: Lives at home with her daughter and walks with a help of a walker.  DVT prophylaxis: enoxaparin (LOVENOX) injection 30 mg Start: 08/29/24 1000     Code Status: Full Code Family Communication:   Status is: Inpatient Remains inpatient appropriate because: sepsis,UTI.AKI    Subjective:  Feels better today.  She said that she started having vomiting almost 2 weeks back.  She lives at home with her daughter and walks with the help of walker.  We spoke about her acute kidney injury as well as sepsis secondary to right-sided pyelonephritis.  Examination:  General exam: Appears calm and comfortable  Respiratory system: Clear to auscultation. Respiratory effort normal. Cardiovascular system: S1 & S2 heard, RRR. No JVD, murmurs, rubs, gallops or clicks. No pedal edema. Gastrointestinal system: Abdomen is nondistended, soft and nontender. No organomegaly or masses felt. Normal bowel sounds heard. Central nervous system: Alert and oriented. No focal neurological deficits. Extremities: Symmetric 5 x 5 power. Skin: No rashes, lesions or ulcers        Diet Orders (From admission, onward)     Start     Ordered   08/28/24 2236  Diet clear liquid Room service appropriate? Yes; Fluid consistency: Thin  Diet effective now       Question Answer Comment  Room service appropriate? Yes   Fluid consistency: Thin      08/28/24 2235            Objective: Vitals:  08/28/24 2316 08/29/24 0252 08/29/24 0358 08/29/24 0755  BP: 112/80 (!) 145/72  (!) 100/58  Pulse: 71 100  80  Resp: 19   18  Temp:  (!) 101 F (38.3 C) 100.3 F (37.9 C) 99.2 F (37.3 C)  TempSrc:  Oral Oral Oral  SpO2:  100%  100%  Weight:      Height:         Intake/Output Summary (Last 24 hours) at 08/29/2024 0903 Last data filed at 08/29/2024 0300 Gross per 24 hour  Intake 351.87 ml  Output --  Net 351.87 ml   Filed Weights   08/28/24 1753 08/28/24 2315  Weight: 49.1 kg 47.4 kg    Scheduled Meds:  enoxaparin (LOVENOX) injection  30 mg Subcutaneous Q24H   gabapentin   300 mg Oral BID   insulin  aspart  0-9 Units Subcutaneous TID AC & HS   levothyroxine   125 mcg Oral q morning   rosuvastatin   10 mg Oral Daily   venlafaxine XR  37.5 mg Oral Q breakfast   Continuous Infusions:  sodium chloride  100 mL/hr at 08/29/24 0242   cefTRIAXone (ROCEPHIN)  IV     magnesium  sulfate bolus IVPB      Nutritional status     Body mass index is 17.92 kg/m.  Data Reviewed:   CBC: Recent Labs  Lab 08/28/24 1855 08/29/24 0440  WBC 13.7* 10.9*  NEUTROABS 11.4*  --   HGB 9.6* 8.1*  HCT 31.7* 26.4*  MCV 93.5 94.3  PLT 330 276   Basic Metabolic Panel: Recent Labs  Lab 08/28/24 1855 08/29/24 0440  NA 132* 138  K 5.0 4.2  CL 97* 106  CO2 17* 14*  GLUCOSE 115* 86  BUN 25* 25*  CREATININE 2.26* 2.21*  CALCIUM  8.8* 7.5*  MG  --  1.5*  PHOS  --  3.5   GFR: Estimated Creatinine Clearance: 18.5 mL/min (A) (by C-G formula based on SCr of 2.21 mg/dL (H)). Liver Function Tests: Recent Labs  Lab 08/28/24 1855  AST 21  ALT 8  ALKPHOS 116  BILITOT 0.7  PROT 6.4*  ALBUMIN 3.4*   No results for input(s): LIPASE, AMYLASE in the last 168 hours. No results for input(s): AMMONIA in the last 168 hours. Coagulation Profile: No results for input(s): INR, PROTIME in the last 168 hours. Cardiac Enzymes: No results for input(s): CKTOTAL, CKMB, CKMBINDEX, TROPONINI in the last 168 hours. BNP (last 3 results) No results for input(s): PROBNP in the last 8760 hours. HbA1C: No results for input(s): HGBA1C in the last 72 hours. CBG: Recent Labs  Lab 08/29/24 0420 08/29/24 0740  GLUCAP 71 88   Lipid Profile: No  results for input(s): CHOL, HDL, LDLCALC, TRIG, CHOLHDL, LDLDIRECT in the last 72 hours. Thyroid  Function Tests: No results for input(s): TSH, T4TOTAL, FREET4, T3FREE, THYROIDAB in the last 72 hours. Anemia Panel: No results for input(s): VITAMINB12, FOLATE, FERRITIN, TIBC, IRON, RETICCTPCT in the last 72 hours. Sepsis Labs: Recent Labs  Lab 08/28/24 1855  LATICACIDVEN 1.1    Recent Results (from the past 240 hours)  Culture, blood (single)     Status: None (Preliminary result)   Collection Time: 08/28/24  6:55 PM   Specimen: BLOOD  Result Value Ref Range Status   Specimen Description BLOOD RIGHT ANTECUBITAL  Final   Special Requests   Final    BOTTLES DRAWN AEROBIC AND ANAEROBIC Blood Culture adequate volume Performed at [facility], [address], [place], [place] [address]  Culture PENDING  Incomplete   Report Status PENDING  Incomplete         Radiology Studies: CT ABDOMEN PELVIS WO CONTRAST Result Date: 08/28/2024 EXAM: CT ABDOMEN AND PELVIS WITHOUT CONTRAST 08/28/2024 08:14:00 PM TECHNIQUE: CT of the abdomen and pelvis was performed without the administration of intravenous contrast. Multiplanar reformatted images are provided for review. Automated exposure control, iterative reconstruction, and/or weight-based adjustment of the mA/kV was utilized to reduce the radiation dose to as low as reasonably achievable. COMPARISON: 05/11/2020. CT chest of 01/04/2023. CLINICAL HISTORY: Abdominal pain, acute, nonlocalized. FINDINGS: LOWER CHEST: 6 mm noncalcified subpleural pulmonary nodule noted within the visualized right lung base, indeterminate, but new since prior examination and CT chest of 01/04/2023. Per Fleischner Society Guidelines recommend a non-contrast chest CT at 6-12 months; if patient is high risk for malignancy, consider an additional non-contrast chest CT at 18-24 months. If patient is low risk for malignancy, non-contrast chest CT at  18-24 months is optional. LIVER: The liver is unremarkable. GALLBLADDER AND BILE DUCTS: There is gallbladder wall thickening noted, a nonspecific finding but can be seen with hydrops, cholesterolosis, or chronic inflammatory change. No superimposed acute pericholecystic inflammatory changes are identified. No biliary ductal dilatation. SPLEEN: 7 mm hypodense nodule within the inferior pole of the spleen is new from the prior examination. It is indeterminate, possibly representing a small cyst or hemangioma. PANCREAS: No acute abnormality. ADRENAL GLANDS: No acute abnormality. KIDNEYS, URETERS AND BLADDER: The kidneys are normal in size and position. Mild asymmetric right perinephric stranding is present, which may relate to an asymmetric inflammatory process, as can be seen with pyelonephritis. There is minimal right hydronephrosis. No intrarenal or ureteral calculi. The left kidney is unremarkable. There is moderate nondependent gas within the bladder lumen, finding that may relate to recent instrumentation or infection. The bladder is decompressed. Correlation with urinalysis and urine culture may be helpful for further management. GI AND BOWEL: Mild sigmoid diverticulosis. The stomach, small bowel, and large bowel are otherwise unremarkable. The appendix is normal. There is no bowel obstruction. PERITONEUM AND RETROPERITONEUM: No ascites. No free air. VASCULATURE: Extensive right coronary artery calcification. Extensive aortoiliac atherosclerotic calcifications. Aorta is normal in caliber. No aortic aneurysm. LYMPH NODES: No lymphadenopathy. REPRODUCTIVE ORGANS: No acute abnormality. BONES AND SOFT TISSUES: Small fat-containing left inguinal hernia. Osseous structures are age appropriate. No acute bone abnormality. No lytic or blastic bone lesion. IMPRESSION: 1. 6 mm noncalcified subpleural pulmonary nodule in the right lung base, new since prior examination; per Fleischner Society Guidelines, recommend a  non-contrast chest CT at 612 months, and if high risk for malignancy consider an additional non-contrast chest CT at 1824 months; if low risk for malignancy, non-contrast chest CT at 1824 months is optional. 2. Gallbladder wall thickening without superimposed acute pericholecystic inflammatory changes. See differential considerations above. Correlation with liver function tests may be helpful. 3. Mild asymmetric right perinephric stranding. Moderate nondependent gas within the bladder lumen. The finding could be seen in the setting of infectious cystitis/ascending urinary tract infection and correlation with urinalysis and urine culture may be helpful for further management. Electronically signed by: Dorethia Molt MD 08/28/2024 09:14 PM EST RP Workstation: [id number]   DG Chest 2 View Result Date: 08/28/2024 CLINICAL DATA:  Weakness. EXAM: CHEST - 2 VIEW COMPARISON:  01/04/2023 FINDINGS: Lung volumes are low. The heart is normal in size for technique. Mediastinal contours are normal. Coarsened lung markings which appear chronic. No confluent opacity, pleural effusion or pneumothorax. Mild gaseous distension of  included upper abdominal bowel loops. Bilateral shoulder arthropathy. IMPRESSION: Low lung volumes without acute abnormality. Electronically Signed   By: Andrea Gasman M.D.   On: 08/28/2024 18:23           LOS: 1 day   Time spent= 35 mins    Deliliah Room, MD [facility]  If 7PM-7AM, please contact night-coverage  08/29/2024, 9:03 AM

## 2024-08-29 NOTE — Evaluation (Signed)
 Occupational Therapy Evaluation Patient Details Name: Erica Beasley MRN: [id number] DOB: [birth year] Today's Date: 08/29/2024   History of Present Illness   Erica Beasley is a 68 y.o. 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  She had an e-visit with her PCP a few days ago and was prescribed Zofran  without relief.  She has not been able to tolerate feedings for the past few days.  Endorses episodes of back pain intermittently.  She presents to the ER for further evaluation. (per DO)     Clinical Impressions Pt agreeable to OT and PT co-evaluation. Pt agreeable and followed commands well. Pt demonstrated bed mobility with mod I level of assist. Pt able to ambulate with RW and CGA. Mild instability noted during sit to stand from standard toilet without use of grab bar. Pt's B UE are weak with limited A/ROM bilaterally. Pt is able to complete ADL's with mod I to set up assist seated and CGA standing. Pt left in the bed with family present. Pt will benefit from continued OT in the hospital to increase strength, balance, and endurance for safe ADL's.         If plan is discharge home, recommend the following:   A little help with walking and/or transfers;A little help with bathing/dressing/bathroom;Assistance with cooking/housework;Assist for transportation;Help with stairs or ramp for entrance     Functional Status Assessment   Patient has had a recent decline in their functional status and demonstrates the ability to make significant improvements in function in a reasonable and predictable amount of time.     Equipment Recommendations   BSC/3in1             Precautions/Restrictions   Precautions Precautions: Fall Recall of  Precautions/Restrictions: Impaired Restrictions Weight Bearing Restrictions Per Provider Order: No     Mobility Bed Mobility Overal bed mobility: Modified Independent             General bed mobility comments: labored movement    Transfers Overall transfer level: Needs assistance Equipment used: Rolling walker (2 wheels) Transfers: Sit to/from Stand Sit to Stand: Contact guard assist           General transfer comment: Sit to stand from EOB followed by ambualtion.      Balance Overall balance assessment: Needs assistance Sitting-balance support: No upper extremity supported, Feet supported Sitting balance-Leahy Scale: Good Sitting balance - Comments: seated at EOB   Standing balance support: Bilateral upper extremity supported, During functional activity, Reliant on assistive device for balance Standing balance-Leahy Scale: Fair Standing balance comment: using RW                           ADL either performed or assessed with clinical judgement   ADL Overall ADL's : Needs assistance/impaired     Grooming: Supervision/safety;Standing;Contact guard assist;Wash/dry hands Grooming Details (indicate cue type and reason): Standing at the swink with RW. Upper Body Bathing: Modified independent;Sitting   Lower Body Bathing: Set up;Sitting/lateral leans   Upper Body Dressing : Modified independent;Sitting   Lower Body Dressing: Set up;Sitting/lateral leans   Toilet Transfer: Contact guard assist;Rolling walker (2 wheels);Ambulation Toilet Transfer Details (indicate cue type and reason): Ambulated to toilet with RW; mild instability during sit to stand from low toilet  without use of grab bar. Toileting- Clothing Manipulation and Hygiene: Set up;Contact guard assist;Sitting/lateral lean;Sit to/from stand       Functional mobility during ADLs: Contact guard assist;Rolling walker (2 wheels) General ADL Comments: Able to ambualte in the hall with RW.      Vision Baseline Vision/History: 1 Wears glasses Ability to See in Adequate Light: 1 Impaired Patient Visual Report: No change from baseline Vision Assessment?: No apparent visual deficits     Perception Perception: Not tested       Praxis Praxis: Not tested       Pertinent Vitals/Pain Pain Assessment Pain Assessment: No/denies pain     Extremity/Trunk Assessment Upper Extremity Assessment Upper Extremity Assessment: RUE deficits/detail;[name] deficits/detail RUE Deficits / Details: 3-/5 shoulder flexion but near full P/ROM; 4-/5 elbow flexion; extension; and gross grasp. [name] Deficits / Details: 2+/5 shoulder flexion with similarly limited P/ROM. 4-/5 grossly otherwise.   Lower Extremity Assessment Lower Extremity Assessment: Defer to PT evaluation LLE Deficits / Details: mild weakness left lower extremity versus right   Cervical / Trunk Assessment Cervical / Trunk Assessment: Kyphotic   Communication Communication Communication: Impaired Factors Affecting Communication: Hearing impaired   Cognition Arousal: Alert Behavior During Therapy: WFL for tasks assessed/performed Cognition: No apparent impairments             OT - Cognition Comments: Mildly impulsive but able to follow commands well.                 Following commands: Intact       Cueing  General Comments   Cueing Techniques: Verbal cues;Tactile cues                 Home Living Family/patient expects to be discharged to:: Private residence Living Arrangements: Children Available Help at Discharge: Available PRN/intermittently Type of Home: House Home Access: Stairs to enter [employer] of Steps: 1 Entrance Stairs-Rails: None Home Layout: Two level;Able to live on main level with bedroom/bathroom     Bathroom Shower/Tub: Chief Strategy Officer: Standard Bathroom Accessibility: Yes How Accessible: Accessible via wheelchair;Accessible via walker Home  Equipment: Rolling Walker (2 wheels);Cane - single point;Shower seat          Prior Functioning/Environment Prior Level of Function : Needs assist       Physical Assist : ADLs (physical)   ADLs (physical): IADLs Mobility Comments: PRN use of RW for ambualtion. Uses scooter in the store. ADLs Comments: Independent ADL's; assisted for IADL's.    OT Problem List: Decreased strength;Decreased range of motion;Decreased activity tolerance;Impaired balance (sitting and/or standing);Decreased safety awareness   OT Treatment/Interventions: Self-care/ADL training;Therapeutic exercise;DME and/or AE instruction;Therapeutic activities;Patient/family education;Balance training      OT Goals(Current goals can be found in the care plan section)   Acute Rehab OT Goals Patient Stated Goal: Return home OT Goal Formulation: With patient/family Time For Goal Achievement: 09/12/24 Potential to Achieve Goals: Good   OT Frequency:  Min 2X/week    Co-evaluation PT/OT/SLP Co-Evaluation/Treatment: Yes Reason for Co-Treatment: To address functional/ADL transfers PT goals addressed during session: Mobility/safety with mobility;Balance;Proper use of DME OT goals addressed during session: ADL's and self-care                       End of Session Equipment Utilized During Treatment: Rolling walker (2 wheels);Gait belt  Activity Tolerance: Patient tolerated treatment well Patient left: in bed;with call bell/phone within reach;with family/visitor present  OT Visit Diagnosis: Unsteadiness on feet (R26.81);Muscle  weakness (generalized) (M62.81);Other abnormalities of gait and mobility (R26.89)                Time: 9075-9054 OT Time Calculation (min): 21 min Charges:  OT General Charges $OT Visit: 1 Visit OT Evaluation $OT Eval Low Complexity: 1 Low  Marian Meneely OT, MOT  Jayson Person 08/29/2024, 11:32 AM

## 2024-08-30 DIAGNOSIS — N12 Tubulo-interstitial nephritis, not specified as acute or chronic: Secondary | ICD-10-CM | POA: Diagnosis not present

## 2024-08-30 LAB — BASIC METABOLIC PANEL WITH GFR
Anion gap: 13 (ref 5–15)
BUN: 15 mg/dL (ref 8–23)
CO2: 16 mmol/L — ABNORMAL LOW (ref 22–32)
Calcium: 6.7 mg/dL — ABNORMAL LOW (ref 8.9–10.3)
Chloride: 112 mmol/L — ABNORMAL HIGH (ref 98–111)
Creatinine, Ser: 1.85 mg/dL — ABNORMAL HIGH (ref 0.44–1.00)
GFR, Estimated: 29 mL/min — ABNORMAL LOW (ref >60)
Glucose, Bld: 81 mg/dL (ref 70–99)
Potassium: 3 mmol/L — ABNORMAL LOW (ref 3.5–5.1)
Sodium: 141 mmol/L (ref 135–145)

## 2024-08-30 LAB — CBC WITH DIFFERENTIAL/PLATELET
Abs Immature Granulocytes: 0.02 K/uL (ref 0.00–0.07)
Basophils Absolute: 0 K/uL (ref 0.0–0.1)
Basophils Relative: 0 %
Eosinophils Absolute: 0 K/uL (ref 0.0–0.5)
Eosinophils Relative: 0 %
HCT: 22.6 % — ABNORMAL LOW (ref 36.0–46.0)
Hemoglobin: 6.9 g/dL — CL (ref 12.0–15.0)
Immature Granulocytes: 0 %
Lymphocytes Relative: 15 %
Lymphs Abs: 1.1 K/uL (ref 0.7–4.0)
MCH: 28.8 pg (ref 26.0–34.0)
MCHC: 30.5 g/dL (ref 30.0–36.0)
MCV: 94.2 fL (ref 80.0–100.0)
Monocytes Absolute: 0.7 K/uL (ref 0.1–1.0)
Monocytes Relative: 10 %
Neutro Abs: 5.2 K/uL (ref 1.7–7.7)
Neutrophils Relative %: 75 %
Platelets: 233 K/uL (ref 150–400)
RBC: 2.4 MIL/uL — ABNORMAL LOW (ref 3.87–5.11)
RDW: 14.8 % (ref 11.5–15.5)
WBC: 7 K/uL (ref 4.0–10.5)
nRBC: 0 % (ref 0.0–0.2)

## 2024-08-30 LAB — GLUCOSE, CAPILLARY
Glucose-Capillary: 73 mg/dL (ref 70–99)
Glucose-Capillary: 118 mg/dL — ABNORMAL HIGH (ref 70–99)
Glucose-Capillary: 96 mg/dL (ref 70–99)
Glucose-Capillary: 110 mg/dL — ABNORMAL HIGH (ref 70–99)

## 2024-08-30 LAB — MAGNESIUM: Magnesium: 2 mg/dL (ref 1.7–2.4)

## 2024-08-30 LAB — PREPARE RBC (CROSSMATCH)

## 2024-08-30 LAB — HEMOGLOBIN A1C
Hgb A1c MFr Bld: 6.8 % — ABNORMAL HIGH (ref 4.8–5.6)
Mean Plasma Glucose: 148 mg/dL

## 2024-08-30 MED ORDER — SODIUM CHLORIDE 0.9% IV SOLUTION: Freq: Once | INTRAVENOUS | Status: AC

## 2024-08-30 NOTE — Therapy (Signed)
 Arrived for treatment; nursing setting up to give patient blood.  Will try again when appropriate.  Of note patient is walking to the sink to wash her hands without AD or assist looking much more steady than at yesterday's evaluation.  10:55 AM, 08/30/24 Erica Beasley Erica Beasley Erica Beasley [facility] [place] [phone]

## 2024-08-30 NOTE — Progress Notes (Signed)
 PROGRESS NOTE    Erica Beasley  FMW:[id number] DOB: March 25, 1956 DOA: 08/28/2024 PCP: Zollie Lowers, MD   Brief Narrative:   68 y.o. 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  Diagnosed with sepsis likely secondary to acute right-sided pyonephritis and is currently on intravenous antibiotics.  She was also found to have acute kidney injury and she is on intravenous fluids.  She lives at home with her daughter and walks with the help of walker.  Assessment & Plan:  Principal Problem:   Pyelonephritis   Sepsis, POA:  Klebsiella bacteremia,POA: Likely secondary to acute right-sided pyelonephritis.  Urinalysis positive for pyuria.  Findings on CT scan are suggestive of right-sided pyonephritis. Follow-up blood cultures and urine cultures and adjust antibiotics accordingly.  Started on intravenous Rocephin  2 g and changed to IV Cefepime  because of bacteremia and intermittent fever while on IV Ceftriaxone .  Nausea and vomiting, POA: Likely secondary to acute pyelonephritis.  Improved.  Continue with mechanical soft diet.  Acute Kidney injury, likely prerenal as well as acute tubular necrosis in the setting of sepsis.  Creatinine of 2.26 with a GFR of 23 on arrival.  Creatinine has improved to 1.85 today.  Baseline creatinine is within normal limits.   Discontinue intravenous fluids.  His oral intake is adequate at this time.  New pulmonary nodule in the right lung base, seen on CT scan, POA. CT scan abdomen pelvis without contrast revealed 6 mm noncalcified subpleural pulmonary nodule in the right lung base, new since prior examination.  Recommend a noncontrast chest CT at 6 to 12 months.  Hypothyroidism,POA: Continue with  synthroid   Hyperlipidemia,POA: Continue with crestor   Diabetes mellitus type II with polyneuropathy,POA: -Continue with SSI and gabapentin    Acute on chronic normocytic anemia: - Hemoglobin of 6.9 today.  No evidence of active bleeding.  Stopped enoxaparin .  Ordered fecal occult blood testing.  Ordered 1 unit of PRBC after talking to the patient. Follow-up H&H closely.  Physical deconditioning,POA: PT/OT consulted  Disposition: Lives at home with her daughter and walks with a help of a walker.  DVT prophylaxis: Stopped enoxaparin  because of dropping hemoglobin.  SCDs bilaterally.     Code Status: Full Code Family Communication: Daughter, Kristie on the phone Status is: Inpatient Remains inpatient appropriate because: sepsis,UTI, AKI, bacteremia    Subjective:  Denies any nausea or vomiting.  Abdominal pain has improved as well.  We spoke about her hemoglobin of 6.9 and the need to transfuse 1 unit of PRBC.  She said that she does get low blood counts but denies any evidence of bleeding.  She is not on any blood thinners at home.  Examination:  General exam: Appears calm and comfortable  Respiratory system: Clear to auscultation. Respiratory effort normal. Cardiovascular system: S1 & S2 heard, RRR. No JVD, murmurs, rubs, gallops or clicks. No pedal edema. Gastrointestinal system: Abdomen is nondistended, soft and nontender. No organomegaly or masses felt. Normal bowel sounds heard. Central nervous system: Alert and oriented. No focal neurological deficits. Extremities: Symmetric 5 x 5 power. Skin: No rashes, lesions or ulcers        Diet Orders (From admission, onward)     Start     Ordered   08/29/24 1014  DIET DYS 3 Room service appropriate? Yes; Fluid consistency: Thin  Diet effective now  Question Answer Comment  Room service appropriate? Yes   Fluid consistency: Thin      08/29/24 1014            Objective: Vitals:   08/29/24 1748 08/29/24 1941  08/30/24 0047 08/30/24 0436  BP: (!) 92/48 114/69 118/64 103/60  Pulse: 75 77 82 72  Resp: (!) 25 18    Temp:  99 F (37.2 C) 98.5 F (36.9 C) 98.8 F (37.1 C)  TempSrc:  Oral Oral Oral  SpO2:  100% 95% 97%  Weight:      Height:        Intake/Output Summary (Last 24 hours) at 08/30/2024 0819 Last data filed at 08/29/2024 1641 Gross per 24 hour  Intake 2043.03 ml  Output --  Net 2043.03 ml   Filed Weights   08/28/24 1753 08/28/24 2315  Weight: 49.1 kg 47.4 kg    Scheduled Meds:  sodium chloride    Intravenous Once   gabapentin   300 mg Oral BID   insulin  aspart  0-9 Units Subcutaneous TID AC & HS   levothyroxine   125 mcg Oral q morning   rosuvastatin   10 mg Oral Daily   venlafaxine  XR  75 mg Oral Q breakfast   Continuous Infusions:  ceFEPime  (MAXIPIME ) IV Stopped (08/29/24 1637)    Nutritional status     Body mass index is 17.92 kg/m.  Data Reviewed:   CBC: Recent Labs  Lab 08/28/24 1855 08/29/24 0440 08/30/24 0455  WBC 13.7* 10.9* 7.0  NEUTROABS 11.4*  --  5.2  HGB 9.6* 8.1* 6.9*  HCT 31.7* 26.4* 22.6*  MCV 93.5 94.3 94.2  PLT 330 276 233   Basic Metabolic Panel: Recent Labs  Lab 08/28/24 1855 08/29/24 0440 08/30/24 0455  NA 132* 138 141  K 5.0 4.2 3.0*  CL 97* 106 112*  CO2 17* 14* 16*  GLUCOSE 115* 86 81  BUN 25* 25* 15  CREATININE 2.26* 2.21* 1.85*  CALCIUM  8.8* 7.5* 6.7*  MG  --  1.5* 2.0  PHOS  --  3.5  --    GFR: Estimated Creatinine Clearance: 22.1 mL/min (A) (by C-G formula based on SCr of 1.85 mg/dL (H)). Liver Function Tests: Recent Labs  Lab 08/28/24 1855  AST 21  ALT 8  ALKPHOS 116  BILITOT 0.7  PROT 6.4*  ALBUMIN 3.4*   No results for input(s): LIPASE, AMYLASE in the last 168 hours. No results for input(s): AMMONIA in the last 168 hours. Coagulation Profile: No results for input(s): INR, PROTIME in the last 168 hours. Cardiac Enzymes: No results for input(s): CKTOTAL, CKMB, CKMBINDEX, TROPONINI in  the last 168 hours. BNP (last 3 results) No results for input(s): PROBNP in the last 8760 hours. HbA1C: Recent Labs    08/28/24 1855  HGBA1C 6.8*   CBG: Recent Labs  Lab 08/29/24 0740 08/29/24 1118 08/29/24 1616 08/29/24 2008 08/30/24 0722  GLUCAP 88 135* 196* 110* 73   Lipid Profile: No results for input(s): CHOL, HDL, LDLCALC, TRIG, CHOLHDL, LDLDIRECT in the last 72 hours. Thyroid  Function Tests: No results for input(s): TSH, T4TOTAL, FREET4, T3FREE, THYROIDAB in the last 72 hours. Anemia Panel: No results for input(s): VITAMINB12, FOLATE, FERRITIN, TIBC, IRON, RETICCTPCT in the last 72 hours. Sepsis Labs: Recent Labs  Lab 08/28/24 1855  LATICACIDVEN 1.1    Recent Results (from the past 240 hours)  Culture, blood (single)     Status: None (Preliminary result)   Collection Time: 08/28/24  6:55 PM   Specimen: BLOOD  Result Value Ref Range Status   Specimen Description   Final    BLOOD RIGHT ANTECUBITAL Performed at [facility], [address], [place], [place] [address]    Special Requests   Final    BOTTLES DRAWN AEROBIC AND ANAEROBIC Blood Culture adequate volume Performed at [facility], [address], [place], [place] [address]    Culture  Setup Time   Final    AEROBIC BOTTLE ONLY GRAM NEGATIVE RODS Gram Stain Report Called to,Read Back By and Verified With: J COLLINS AT 1147 ON 12.04.25 BY ADGER J  CRITICAL RESULT CALLED TO, READ BACK BY AND VERIFIED WITH: RN PHILIPPE COLLINS ON 08/29/24 @ 1835 BY DRT Performed at [facility], [address] [place], [place] [address]    Culture GRAM NEGATIVE RODS  Final   Report Status PENDING  Incomplete  Blood Culture ID Panel (Reflexed)     Status: Abnormal   Collection Time: 08/28/24  6:55 PM  Result Value Ref Range Status   Enterococcus faecalis NOT DETECTED NOT DETECTED Final   Enterococcus Faecium NOT DETECTED NOT DETECTED Final   Listeria monocytogenes NOT DETECTED NOT  DETECTED Final   Staphylococcus species NOT DETECTED NOT DETECTED Final   Staphylococcus aureus (BCID) NOT DETECTED NOT DETECTED Final   Staphylococcus epidermidis NOT DETECTED NOT DETECTED Final   Staphylococcus lugdunensis NOT DETECTED NOT DETECTED Final   Streptococcus species NOT DETECTED NOT DETECTED Final   Streptococcus agalactiae NOT DETECTED NOT DETECTED Final   Streptococcus pneumoniae NOT DETECTED NOT DETECTED Final   Streptococcus pyogenes NOT DETECTED NOT DETECTED Final   A.calcoaceticus-baumannii NOT DETECTED NOT DETECTED Final   Bacteroides fragilis NOT DETECTED NOT DETECTED Final   Enterobacterales DETECTED (A) NOT DETECTED Final    Comment: Enterobacterales represent a large order of gram negative bacteria, not a single organism. CRITICAL RESULT CALLED TO, READ BACK BY AND VERIFIED WITH: RN JOANNA COLLINS ON 08/29/24 @ 1835 BY DRT    Enterobacter cloacae complex NOT DETECTED NOT DETECTED Final   Escherichia coli NOT DETECTED NOT DETECTED Final   Klebsiella aerogenes NOT DETECTED NOT DETECTED Final   Klebsiella oxytoca NOT DETECTED NOT DETECTED Final   Klebsiella pneumoniae DETECTED (A) NOT DETECTED Final    Comment: CRITICAL RESULT CALLED TO, READ BACK BY AND VERIFIED WITH: RN JOANNA COLLINS ON 08/29/24 @ 1835 BY DRT    Proteus species NOT DETECTED NOT DETECTED Final   Salmonella species NOT DETECTED NOT DETECTED Final   Serratia marcescens NOT DETECTED NOT DETECTED Final   Haemophilus influenzae NOT DETECTED NOT DETECTED Final   Neisseria meningitidis NOT DETECTED NOT DETECTED Final   Pseudomonas aeruginosa NOT DETECTED NOT DETECTED Final   Stenotrophomonas maltophilia NOT DETECTED NOT DETECTED Final   Candida albicans NOT DETECTED NOT DETECTED Final   Candida auris NOT DETECTED NOT DETECTED Final   Candida glabrata NOT DETECTED NOT DETECTED Final   Candida krusei NOT DETECTED NOT DETECTED Final   Candida parapsilosis NOT DETECTED NOT DETECTED Final   Candida  tropicalis NOT DETECTED NOT DETECTED Final   Cryptococcus neoformans/gattii NOT DETECTED NOT DETECTED Final   CTX-M ESBL NOT DETECTED NOT DETECTED Final   Carbapenem resistance IMP NOT DETECTED NOT DETECTED Final   Carbapenem resistance KPC NOT DETECTED NOT DETECTED Final   Carbapenem resistance NDM NOT DETECTED NOT DETECTED Final   Carbapenem resist OXA 48 LIKE NOT DETECTED NOT DETECTED Final   Carbapenem resistance VIM NOT DETECTED NOT DETECTED Final    Comment: Performed at  [facility], [address] [place], [place] [address]         Radiology Studies: CT ABDOMEN PELVIS WO CONTRAST Result Date: 08/28/2024 EXAM: CT ABDOMEN AND PELVIS WITHOUT CONTRAST 08/28/2024 08:14:00 PM TECHNIQUE: CT of the abdomen and pelvis was performed without the administration of intravenous contrast. Multiplanar reformatted images are provided for review. Automated exposure control, iterative reconstruction, and/or weight-based adjustment of the mA/kV was utilized to reduce the radiation dose to as low as reasonably achievable. COMPARISON: 05/11/2020. CT chest of 01/04/2023. CLINICAL HISTORY: Abdominal pain, acute, nonlocalized. FINDINGS: LOWER CHEST: 6 mm noncalcified subpleural pulmonary nodule noted within the visualized right lung base, indeterminate, but new since prior examination and CT chest of 01/04/2023. Per Fleischner Society Guidelines recommend a non-contrast chest CT at 6-12 months; if patient is high risk for malignancy, consider an additional non-contrast chest CT at 18-24 months. If patient is low risk for malignancy, non-contrast chest CT at 18-24 months is optional. LIVER: The liver is unremarkable. GALLBLADDER AND BILE DUCTS: There is gallbladder wall thickening noted, a nonspecific finding but can be seen with hydrops, cholesterolosis, or chronic inflammatory change. No superimposed acute pericholecystic inflammatory changes are identified. No biliary ductal dilatation. SPLEEN: 7 mm  hypodense nodule within the inferior pole of the spleen is new from the prior examination. It is indeterminate, possibly representing a small cyst or hemangioma. PANCREAS: No acute abnormality. ADRENAL GLANDS: No acute abnormality. KIDNEYS, URETERS AND BLADDER: The kidneys are normal in size and position. Mild asymmetric right perinephric stranding is present, which may relate to an asymmetric inflammatory process, as can be seen with pyelonephritis. There is minimal right hydronephrosis. No intrarenal or ureteral calculi. The left kidney is unremarkable. There is moderate nondependent gas within the bladder lumen, finding that may relate to recent instrumentation or infection. The bladder is decompressed. Correlation with urinalysis and urine culture may be helpful for further management. GI AND BOWEL: Mild sigmoid diverticulosis. The stomach, small bowel, and large bowel are otherwise unremarkable. The appendix is normal. There is no bowel obstruction. PERITONEUM AND RETROPERITONEUM: No ascites. No free air. VASCULATURE: Extensive right coronary artery calcification. Extensive aortoiliac atherosclerotic calcifications. Aorta is normal in caliber. No aortic aneurysm. LYMPH NODES: No lymphadenopathy. REPRODUCTIVE ORGANS: No acute abnormality. BONES AND SOFT TISSUES: Small fat-containing left inguinal hernia. Osseous structures are age appropriate. No acute bone abnormality. No lytic or blastic bone lesion. IMPRESSION: 1. 6 mm noncalcified subpleural pulmonary nodule in the right lung base, new since prior examination; per Fleischner Society Guidelines, recommend a non-contrast chest CT at 612 months, and if high risk for malignancy consider an additional non-contrast chest CT at 1824 months; if low risk for malignancy, non-contrast chest CT at 1824 months is optional. 2. Gallbladder wall thickening without superimposed acute pericholecystic inflammatory changes. See differential considerations above. Correlation  with liver function tests may be helpful. 3. Mild asymmetric right perinephric stranding. Moderate nondependent gas within the bladder lumen. The finding could be seen in the setting of infectious cystitis/ascending urinary tract infection and correlation with urinalysis and urine culture may be helpful for further management. Electronically signed by: Dorethia Molt MD 08/28/2024 09:14 PM EST RP Workstation: [id number]   DG Chest 2 View Result Date: 08/28/2024 CLINICAL DATA:  Weakness. EXAM: CHEST - 2 VIEW COMPARISON:  01/04/2023 FINDINGS: Lung volumes are low. The heart is normal in size for technique. Mediastinal contours are normal. Coarsened lung markings which appear chronic. No confluent opacity, pleural effusion or pneumothorax. Mild gaseous distension of  included upper abdominal bowel loops. Bilateral shoulder arthropathy. IMPRESSION: Low lung volumes without acute abnormality. Electronically Signed   By: Andrea Gasman M.D.   On: 08/28/2024 18:23           LOS: 2 days   Time spent= 40 mins    Deliliah Room, MD [facility]  If 7PM-7AM, please contact night-coverage  08/30/2024, 8:19 AM

## 2024-08-30 NOTE — Plan of Care (Signed)
  Problem: Education: Goal: Knowledge of General Education information will improve Description: Including pain rating scale, medication(s)/side effects and non-pharmacologic comfort measures Outcome: Adequate for Discharge   Problem: Clinical Measurements: Goal: Ability to maintain clinical measurements within normal limits will improve Outcome: Adequate for Discharge Goal: Diagnostic test results will improve Outcome: Adequate for Discharge

## 2024-08-30 NOTE — ED Provider Notes (Signed)
 [facility] MEDICAL SURGICAL UNIT Provider Note   CSN: [id number] Arrival date & time: 08/28/24  1733     Patient presents with: Emesis   Erica Beasley is a 68 y.o. female.   Patient complaining of vomiting mild abdominal pain abdominal pain.  No fevers no chills.  Patient has a history of bronchospasm  The history is provided by the patient and medical records. No language interpreter was used.  Emesis Severity:  Mild Timing:  Constant Quality:  Bilious material Able to tolerate:  Liquids Progression:  Worsening Chronicity:  New Recent urination:  Normal Relieved by:  Nothing Worsened by:  Nothing Ineffective treatments:  None tried Associated symptoms: abdominal pain   Associated symptoms: no cough, no diarrhea and no headaches        Prior to Admission medications   Medication Sig Start Date End Date Taking? Authorizing Provider  albuterol  (VENTOLIN  HFA) 108 (90 Base) MCG/ACT inhaler Inhale 2 puffs into the lungs every 6 (six) hours as needed for wheezing or shortness of breath.   Yes [provider]  alendronate  (FOSAMAX ) 70 MG tablet Take 1 tablet (70 mg total) by mouth every 7 (seven) days. Take with a full glass of water  on an empty stomach. 06/10/24  Yes Zollie Lowers, MD  aspirin  EC 81 MG tablet Take 1 tablet (81 mg total) by mouth daily. Swallow whole. 07/11/22  Yes Shahmehdi, Adriana DELENA, MD  calcium  carbonate (OSCAL) 1500 (600 Ca) MG TABS tablet Take 1,500 mg by mouth 2 (two) times daily with a meal.   Yes [provider]  candesartan (ATACAND) 4 MG tablet Take 4 mg by mouth daily.   Yes [provider]  colchicine 0.6 MG tablet Take 0.6 mg by mouth daily. 06/21/23  Yes [provider]  Cyanocobalamin  (B-12) 500 MCG TABS Take 500 mcg by mouth every other day. 04/25/23  Yes Pennington, Rebekah M, PA-C  desvenlafaxine  (PRISTIQ ) 50 MG 24 hr tablet Take 1 tablet (50 mg total) by mouth daily. 11/27/23  Yes Stacks, Lowers, MD  etodolac  (LODINE) 300 MG capsule Take 300 mg by mouth 2 (two) times daily. 06/21/23  Yes [provider]  fluticasone  (FLONASE ) 50 MCG/ACT nasal spray Place 2 sprays into both nostrils daily. 07/22/24  Yes Burnette, Delon HERO, PA-C  folic acid  (FOLVITE ) 1 MG tablet TAKE ONE TABLET ONCE DAILY 12/11/23  Yes Katragadda, Sreedhar, MD  gabapentin  (NEURONTIN ) 300 MG capsule Take 2 capsules (600 mg total) by mouth 2 (two) times daily. 02/28/24  Yes Zollie Lowers, MD  hydroxychloroquine  (PLAQUENIL ) 200 MG tablet Take 200 mg by mouth 2 (two) times daily.   Yes [provider]  JARDIANCE  10 MG TABS tablet TAKE ONE TABLET ONCE DAILY FOR DIABETES 08/13/24  Yes Zollie Lowers, MD  levothyroxine  (SYNTHROID ) 125 MCG tablet TAKE ONE TABLET EVERY MORNING 07/30/24  Yes Zollie Lowers, MD  liothyronine  (CYTOMEL ) 5 MCG tablet TAKE TWO TABLETS BY MOUTH DAILY 08/13/24  Yes Zollie Lowers, MD  megestrol  (MEGACE ) 40 MG/ML suspension TAKE 10 ML BY MOUTH TWICE DAILY 03/18/24  Yes Rogers Hai, MD  metFORMIN  (GLUCOPHAGE -XR) 750 MG 24 hr tablet Take 1 tablet (750 mg total) by mouth 2 (two) times daily with a meal. 11/27/23  Yes Zollie Lowers, MD  ondansetron  (ZOFRAN -ODT) 4 MG disintegrating tablet Take 1 tablet (4 mg total) by mouth every 8 (eight) hours as needed for nausea or vomiting. 08/22/24  Yes Gladis Elsie BROCKS, PA-C  potassium chloride  SA (KLOR-CON  M) 20 MEQ tablet  Take 1 tablet (20 mEq total) by mouth daily. 06/10/24  Yes Pennington, Rebekah M, PA-C  predniSONE  (DELTASONE ) 5 MG tablet Take 5-10 mg by mouth daily. 05/08/23  Yes [provider]  rosuvastatin  (CRESTOR ) 40 MG tablet Take 1 tablet (40 mg total) by mouth daily. 07/24/23  Yes Zollie Lowers, MD  traZODone  (DESYREL ) 100 MG tablet Take 1 tablet (100 mg total) by mouth at bedtime as needed. for sleep 11/27/23  Yes Zollie Lowers, MD  Blood Glucose Monitoring Suppl DEVI 1 each by Does not apply route in the morning, at noon, and at bedtime. May  substitute to any manufacturer covered by patient's insurance. 11/30/22   Zollie Lowers, MD  glucose blood (ACCU-CHEK GUIDE TEST) test strip Check BS twice daily before meals Dx R73.9 02/05/24   Zollie Lowers, MD  leflunomide  (ARAVA ) 20 MG tablet TAKE 1 TABLET ONCE A DAY FOR RHEUMATOID ARTHRITIS Patient not taking: Reported on 08/29/2024    [provider]    Allergies: Adalimumab, Methotrexate, Sulfasalazine , and Upadacitinib    Review of Systems  Constitutional:  Negative for appetite change and fatigue.  HENT:  Negative for congestion, ear discharge and sinus pressure.   Eyes:  Negative for discharge.  Respiratory:  Negative for cough.   Cardiovascular:  Negative for chest pain.  Gastrointestinal:  Positive for abdominal pain and vomiting. Negative for diarrhea.  Genitourinary:  Negative for frequency and hematuria.  Musculoskeletal:  Negative for back pain.  Skin:  Negative for rash.  Neurological:  Negative for seizures and headaches.  Psychiatric/Behavioral:  Negative for hallucinations.     Updated Vital Signs BP 116/67   Pulse 71   Temp 98.5 F (36.9 C) (Oral)   Resp 20   Ht 5' 4 (1.626 m)   Wt 47.4 kg   SpO2 98%   BMI 17.92 kg/m   Physical Exam Vitals and nursing note reviewed.  Constitutional:      Appearance: She is well-developed.  HENT:     Head: Normocephalic.     Nose: Nose normal.  Eyes:     General: No scleral icterus.    Conjunctiva/sclera: Conjunctivae normal.  Neck:     Thyroid : No thyromegaly.  Cardiovascular:     Rate and Rhythm: Normal rate and regular rhythm.     Heart sounds: No murmur heard.    No friction rub. No gallop.  Pulmonary:     Breath sounds: No stridor. No wheezing or rales.  Chest:     Chest wall: No tenderness.  Abdominal:     General: There is no distension.     Tenderness: There is no abdominal tenderness. There is no rebound.  Musculoskeletal:        General: Normal range of motion.     Cervical back: Neck  supple.  Lymphadenopathy:     Cervical: No cervical adenopathy.  Skin:    Findings: No erythema or rash.  Neurological:     Mental Status: She is alert and oriented to person, place, and time.     Motor: No abnormal muscle tone.     Coordination: Coordination normal.  Psychiatric:        Behavior: Behavior normal.     (all labs ordered are listed, but only abnormal results are displayed) Labs Reviewed  CULTURE, BLOOD (SINGLE) - Abnormal; Notable for the following components:      Result Value   Culture   (*)    Value: KLEBSIELLA PNEUMONIAE SUSCEPTIBILITIES TO FOLLOW Performed at [facility]  Lab, [address] [place], [place] [address]    All other components within normal limits  BLOOD CULTURE ID PANEL (REFLEXED) - BCID2 - Abnormal; Notable for the following components:   Enterobacterales DETECTED (*)    Klebsiella pneumoniae DETECTED (*)    All other components within normal limits  COMPREHENSIVE METABOLIC PANEL WITH GFR - Abnormal; Notable for the following components:   Sodium 132 (*)    Chloride 97 (*)    CO2 17 (*)    Glucose, Bld 115 (*)    BUN 25 (*)    Creatinine, Ser 2.26 (*)    Calcium  8.8 (*)    Total Protein 6.4 (*)    Albumin 3.4 (*)    GFR, Estimated 23 (*)    Anion gap 18 (*)    All other components within normal limits  CBC WITH DIFFERENTIAL/PLATELET - Abnormal; Notable for the following components:   WBC 13.7 (*)    RBC 3.39 (*)    Hemoglobin 9.6 (*)    HCT 31.7 (*)    Neutro Abs 11.4 (*)    Monocytes Absolute 1.3 (*)    Abs Immature Granulocytes 0.13 (*)    All other components within normal limits  URINALYSIS, W/ REFLEX TO CULTURE (INFECTION SUSPECTED) - Abnormal; Notable for the following components:   APPearance CLOUDY (*)    Glucose, UA >=500 (*)    Hgb urine dipstick SMALL (*)    Ketones, ur 5 (*)    Protein, ur 100 (*)    Leukocytes,Ua MODERATE (*)    Bacteria, UA RARE (*)    All other components within normal limits  CBC -  Abnormal; Notable for the following components:   WBC 10.9 (*)    RBC 2.80 (*)    Hemoglobin 8.1 (*)    HCT 26.4 (*)    All other components within normal limits  BASIC METABOLIC PANEL WITH GFR - Abnormal; Notable for the following components:   CO2 14 (*)    BUN 25 (*)    Creatinine, Ser 2.21 (*)    Calcium  7.5 (*)    GFR, Estimated 24 (*)    Anion gap 18 (*)    All other components within normal limits  MAGNESIUM  - Abnormal; Notable for the following components:   Magnesium  1.5 (*)    All other components within normal limits  HEMOGLOBIN A1C - Abnormal; Notable for the following components:   Hgb A1c MFr Bld 6.8 (*)    All other components within normal limits  GLUCOSE, CAPILLARY - Abnormal; Notable for the following components:   Glucose-Capillary 135 (*)    All other components within normal limits  GLUCOSE, CAPILLARY - Abnormal; Notable for the following components:   Glucose-Capillary 196 (*)    All other components within normal limits  CBC WITH DIFFERENTIAL/PLATELET - Abnormal; Notable for the following components:   RBC 2.40 (*)    Hemoglobin 6.9 (*)    HCT 22.6 (*)    All other components within normal limits  BASIC METABOLIC PANEL WITH GFR - Abnormal; Notable for the following components:   Potassium 3.0 (*)    Chloride 112 (*)    CO2 16 (*)    Creatinine, Ser 1.85 (*)    Calcium  6.7 (*)    GFR, Estimated 29 (*)    All other components within normal limits  GLUCOSE, CAPILLARY - Abnormal; Notable for the following components:   Glucose-Capillary 110 (*)    All other components within normal  limits  GLUCOSE, CAPILLARY - Abnormal; Notable for the following components:   Glucose-Capillary 118 (*)    All other components within normal limits  LACTIC ACID, PLASMA  HIV ANTIBODY (ROUTINE TESTING W REFLEX)  PHOSPHORUS  GLUCOSE, CAPILLARY  GLUCOSE, CAPILLARY  MAGNESIUM   GLUCOSE, CAPILLARY  OCCULT BLOOD X 1 CARD TO LAB, STOOL  PREPARE RBC (CROSSMATCH)  TYPE AND  SCREEN    EKG: EKG Interpretation Date/Time:  Wednesday August 28 2024 19:22:41 EST Ventricular Rate:  84 PR Interval:  157 QRS Duration:  142 QT Interval:  440 QTC Calculation: 521 R Axis:   -85  Text Interpretation: Sinus rhythm RBBB and LAFB Confirmed by Griselda Norris [phone]) on 08/29/2024 7:35:45 PM  Radiology: CT ABDOMEN PELVIS WO CONTRAST Result Date: 08/28/2024 EXAM: CT ABDOMEN AND PELVIS WITHOUT CONTRAST 08/28/2024 08:14:00 PM TECHNIQUE: CT of the abdomen and pelvis was performed without the administration of intravenous contrast. Multiplanar reformatted images are provided for review. Automated exposure control, iterative reconstruction, and/or weight-based adjustment of the mA/kV was utilized to reduce the radiation dose to as low as reasonably achievable. COMPARISON: 05/11/2020. CT chest of 01/04/2023. CLINICAL HISTORY: Abdominal pain, acute, nonlocalized. FINDINGS: LOWER CHEST: 6 mm noncalcified subpleural pulmonary nodule noted within the visualized right lung base, indeterminate, but new since prior examination and CT chest of 01/04/2023. Per Fleischner Society Guidelines recommend a non-contrast chest CT at 6-12 months; if patient is high risk for malignancy, consider an additional non-contrast chest CT at 18-24 months. If patient is low risk for malignancy, non-contrast chest CT at 18-24 months is optional. LIVER: The liver is unremarkable. GALLBLADDER AND BILE DUCTS: There is gallbladder wall thickening noted, a nonspecific finding but can be seen with hydrops, cholesterolosis, or chronic inflammatory change. No superimposed acute pericholecystic inflammatory changes are identified. No biliary ductal dilatation. SPLEEN: 7 mm hypodense nodule within the inferior pole of the spleen is new from the prior examination. It is indeterminate, possibly representing a small cyst or hemangioma. PANCREAS: No acute abnormality. ADRENAL GLANDS: No acute abnormality. KIDNEYS, URETERS AND  BLADDER: The kidneys are normal in size and position. Mild asymmetric right perinephric stranding is present, which may relate to an asymmetric inflammatory process, as can be seen with pyelonephritis. There is minimal right hydronephrosis. No intrarenal or ureteral calculi. The left kidney is unremarkable. There is moderate nondependent gas within the bladder lumen, finding that may relate to recent instrumentation or infection. The bladder is decompressed. Correlation with urinalysis and urine culture may be helpful for further management. GI AND BOWEL: Mild sigmoid diverticulosis. The stomach, small bowel, and large bowel are otherwise unremarkable. The appendix is normal. There is no bowel obstruction. PERITONEUM AND RETROPERITONEUM: No ascites. No free air. VASCULATURE: Extensive right coronary artery calcification. Extensive aortoiliac atherosclerotic calcifications. Aorta is normal in caliber. No aortic aneurysm. LYMPH NODES: No lymphadenopathy. REPRODUCTIVE ORGANS: No acute abnormality. BONES AND SOFT TISSUES: Small fat-containing left inguinal hernia. Osseous structures are age appropriate. No acute bone abnormality. No lytic or blastic bone lesion. IMPRESSION: 1. 6 mm noncalcified subpleural pulmonary nodule in the right lung base, new since prior examination; per Fleischner Society Guidelines, recommend a non-contrast chest CT at 612 months, and if high risk for malignancy consider an additional non-contrast chest CT at 1824 months; if low risk for malignancy, non-contrast chest CT at 1824 months is optional. 2. Gallbladder wall thickening without superimposed acute pericholecystic inflammatory changes. See differential considerations above. Correlation with liver function tests may be helpful. 3. Mild asymmetric right perinephric stranding.  Moderate nondependent gas within the bladder lumen. The finding could be seen in the setting of infectious cystitis/ascending urinary tract infection and  correlation with urinalysis and urine culture may be helpful for further management. Electronically signed by: Dorethia Molt MD 08/28/2024 09:14 PM EST RP Workstation: [id number]   DG Chest 2 View Result Date: 08/28/2024 CLINICAL DATA:  Weakness. EXAM: CHEST - 2 VIEW COMPARISON:  01/04/2023 FINDINGS: Lung volumes are low. The heart is normal in size for technique. Mediastinal contours are normal. Coarsened lung markings which appear chronic. No confluent opacity, pleural effusion or pneumothorax. Mild gaseous distension of included upper abdominal bowel loops. Bilateral shoulder arthropathy. IMPRESSION: Low lung volumes without acute abnormality. Electronically Signed   By: Andrea Gasman M.D.   On: 08/28/2024 18:23     Procedures   Medications Ordered in the ED  0.9 %  sodium chloride  infusion (0 mLs Intravenous Stopped 08/30/24 0858)  acetaminophen  (TYLENOL ) tablet 500 mg (500 mg Oral Given 08/29/24 1328)  prochlorperazine  (COMPAZINE ) injection 5 mg (has no administration in time range)  melatonin tablet 5 mg (has no administration in time range)  polyethylene glycol (MIRALAX  / GLYCOLAX ) packet 17 g (has no administration in time range)  oxyCODONE  (Oxy IR/ROXICODONE ) immediate release tablet 5 mg (has no administration in time range)  levothyroxine  (SYNTHROID ) tablet 125 mcg (125 mcg Oral Given 08/30/24 0607)  rosuvastatin  (CRESTOR ) tablet 10 mg (10 mg Oral Given 08/30/24 0859)  gabapentin  (NEURONTIN ) capsule 300 mg (300 mg Oral Given 08/30/24 0859)  insulin  aspart (novoLOG ) injection 0-9 Units ( Subcutaneous Not Given 08/30/24 1133)  venlafaxine  XR (EFFEXOR -XR) 24 hr capsule 75 mg (75 mg Oral Given 08/30/24 0858)  ceFEPIme  (MAXIPIME ) 2 g in sodium chloride  0.9 % 100 mL IVPB (0 g Intravenous Stopped 08/29/24 1637)  acetaminophen  (TYLENOL ) tablet 650 mg (650 mg Oral Given 08/28/24 1801)  cefTRIAXone  (ROCEPHIN ) 2 g in sodium chloride  0.9 % 100 mL IVPB (0 g Intravenous Stopped 08/28/24 2014)  sodium  chloride 0.9 % bolus 1,000 mL (0 mLs Intravenous Stopped 08/28/24 2200)  sodium chloride  0.9 % bolus 1,000 mL (1,000 mLs Intravenous New Bag/Given 08/28/24 2210)  magnesium  sulfate IVPB 2 g 50 mL (0 g Intravenous Stopped 08/29/24 1119)  sodium chloride  0.9 % bolus 1,000 mL (0 mLs Intravenous Stopped 08/29/24 1748)  0.9 %  sodium chloride  infusion (Manually program via Guardrails IV Fluids) ( Intravenous New Bag/Given 08/30/24 0954)     CRITICAL CARE Performed by: Fairy Sermon Total critical care time: 40 minutes Critical care time was exclusive of separately billable procedures and treating other patients. Critical care was necessary to treat or prevent imminent or life-threatening deterioration. Critical care was time spent personally by me on the following activities: development of treatment plan with patient and/or surrogate as well as nursing, discussions with consultants, evaluation of patient's response to treatment, examination of patient, obtaining history from patient or surrogate, ordering and performing treatments and interventions, ordering and review of laboratory studies, ordering and review of radiographic studies, pulse oximetry and re-evaluation of patient's condition.                                Medical Decision Making Amount and/or Complexity of Data Reviewed Labs: ordered. Radiology: ordered.  Risk OTC drugs. Decision regarding hospitalization.   Patient with nephritis.  She will be admitted to medicine     Final diagnoses:  None    ED Discharge Orders  None          Suzette Pac, MD 08/30/24 1202

## 2024-08-30 NOTE — Care Management Important Message (Signed)
 Important Message  Patient Details  Name: Erica Beasley MRN: [id number] Date of Birth: 08/31/1956   Important Message Given:  Yes - Medicare IM     [name] L Jody Silas 08/30/2024, 1:16 PM

## 2024-08-31 DIAGNOSIS — N12 Tubulo-interstitial nephritis, not specified as acute or chronic: Secondary | ICD-10-CM | POA: Diagnosis not present

## 2024-08-31 LAB — GLUCOSE, CAPILLARY
Glucose-Capillary: 106 mg/dL — ABNORMAL HIGH (ref 70–99)
Glucose-Capillary: 123 mg/dL — ABNORMAL HIGH (ref 70–99)
Glucose-Capillary: 157 mg/dL — ABNORMAL HIGH (ref 70–99)
Glucose-Capillary: 87 mg/dL (ref 70–99)

## 2024-08-31 LAB — BASIC METABOLIC PANEL WITH GFR
Anion gap: 11 (ref 5–15)
BUN: 11 mg/dL (ref 8–23)
CO2: 22 mmol/L (ref 22–32)
Calcium: 7.2 mg/dL — ABNORMAL LOW (ref 8.9–10.3)
Chloride: 110 mmol/L (ref 98–111)
Creatinine, Ser: 1.58 mg/dL — ABNORMAL HIGH (ref 0.44–1.00)
GFR, Estimated: 35 mL/min — ABNORMAL LOW (ref >60)
Glucose, Bld: 105 mg/dL — ABNORMAL HIGH (ref 70–99)
Potassium: 2.3 mmol/L — CL (ref 3.5–5.1)
Sodium: 143 mmol/L (ref 135–145)

## 2024-08-31 LAB — TYPE AND SCREEN
ABO/RH(D): O POS
Antibody Screen: NEGATIVE
Unit division: 0

## 2024-08-31 LAB — CBC WITH DIFFERENTIAL/PLATELET
Abs Immature Granulocytes: 0.04 K/uL (ref 0.00–0.07)
Basophils Absolute: 0 K/uL (ref 0.0–0.1)
Basophils Relative: 0 %
Eosinophils Absolute: 0.1 K/uL (ref 0.0–0.5)
Eosinophils Relative: 1 %
HCT: 28.7 % — ABNORMAL LOW (ref 36.0–46.0)
Hemoglobin: 9 g/dL — ABNORMAL LOW (ref 12.0–15.0)
Immature Granulocytes: 1 %
Lymphocytes Relative: 16 %
Lymphs Abs: 1.2 K/uL (ref 0.7–4.0)
MCH: 28.6 pg (ref 26.0–34.0)
MCHC: 31.4 g/dL (ref 30.0–36.0)
MCV: 91.1 fL (ref 80.0–100.0)
Monocytes Absolute: 0.7 K/uL (ref 0.1–1.0)
Monocytes Relative: 10 %
Neutro Abs: 5.4 K/uL (ref 1.7–7.7)
Neutrophils Relative %: 72 %
Platelets: 224 K/uL (ref 150–400)
RBC: 3.15 MIL/uL — ABNORMAL LOW (ref 3.87–5.11)
RDW: 14.6 % (ref 11.5–15.5)
WBC: 7.4 K/uL (ref 4.0–10.5)
nRBC: 0 % (ref 0.0–0.2)

## 2024-08-31 LAB — MAGNESIUM: Magnesium: 1.6 mg/dL — ABNORMAL LOW (ref 1.7–2.4)

## 2024-08-31 LAB — CULTURE, BLOOD (SINGLE): Special Requests: ADEQUATE

## 2024-08-31 LAB — BPAM RBC
Blood Product Expiration Date: 202512282359
ISSUE DATE / TIME: 202512051003
Unit Type and Rh: 5100

## 2024-08-31 MED ORDER — POTASSIUM CHLORIDE 10 MEQ/100ML IV SOLN
10.0000 meq | INTRAVENOUS | Status: AC
  Administered 2024-08-31 (×4): 10 meq via INTRAVENOUS
  Filled 2024-08-31 (×3): qty 100

## 2024-08-31 MED ORDER — POTASSIUM CHLORIDE CRYS ER 20 MEQ PO TBCR
40.0000 meq | EXTENDED_RELEASE_TABLET | Freq: Once | ORAL | Status: AC
  Administered 2024-08-31: 40 meq via ORAL
  Filled 2024-08-31: qty 2

## 2024-08-31 NOTE — Progress Notes (Signed)
 PROGRESS NOTE    Erica Beasley  FMW:[id number] DOB: 08-09-1956 DOA: 08/28/2024 PCP: Zollie Lowers, MD   Brief Narrative:   68 y.o. 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  Diagnosed with sepsis likely secondary to acute right-sided pyonephritis and is currently on intravenous antibiotics.  She was also found to have acute kidney injury and she is on intravenous fluids.  F/u blood culture sensitivities.  12/6: Hypokalemia, ordered repletion   She lives at home with her daughter and walks with the help of walker.  Assessment & Plan:  Principal Problem:   Pyelonephritis   Sepsis, POA:  Klebsiella bacteremia,POA: Likely secondary to acute right-sided pyelonephritis.  Urinalysis positive for pyuria.  Findings on CT scan are suggestive of right-sided pyonephritis. Follow-up blood cultures and urine cultures and adjust antibiotics accordingly.  Started on intravenous Rocephin  2 g and changed to IV Cefepime  because of bacteremia and intermittent fever while on IV Ceftriaxone .  Severe hypokalemia: K of 2.3 today. Ordered a total of 40 meq PO and 40 meq IV KCL F/u Mag F/u BMP in am  Nausea and vomiting, POA: Likely secondary to acute pyelonephritis.  Improved.  Continue with mechanical soft diet.  Acute Kidney injury, likely prerenal as well as acute tubular necrosis in the setting of sepsis.  Creatinine of 2.26 with a GFR of 23 on arrival.  Creatinine has improved to 1.58 today.  Baseline creatinine is within normal limits.   Discontinue intravenous fluids.  Her oral intake is adequate at this time.  New pulmonary nodule in the right lung base, seen on CT scan, POA. CT scan abdomen pelvis without contrast revealed 6 mm noncalcified  subpleural pulmonary nodule in the right lung base, new since prior examination.  Recommend a noncontrast chest CT at 6 to 12 months.  Hypothyroidism,POA: Continue with synthroid   Hyperlipidemia,POA: Continue with crestor   Diabetes mellitus type II with polyneuropathy,POA: -Continue with SSI and gabapentin    Acute on chronic normocytic anemia: - Hemoglobin of 6.9 today.  No evidence of active bleeding.  Stopped enoxaparin .  Ordered fecal occult blood testing.  Ordered 1 unit of PRBC after talking to the patient. Follow-up H&H closely.  Physical deconditioning,POA: PT/OT consulted  Disposition: Lives at home with her daughter and walks with a help of a walker.  DVT prophylaxis: Place and maintain sequential compression device Start: 08/30/24 0822Stopped enoxaparin  because of dropping hemoglobin.  SCDs bilaterally.     Code Status: Full Code Family Communication: Daughter, Kristie on the phone Status is: Inpatient Remains inpatient appropriate because: sepsis,UTI, AKI, bacteremia    Subjective:  She wants to get out of the bed today. We spoke about her hypokalemia. She denies abdominal pain or diarrhea.  Examination:  General exam: Appears calm and comfortable  Respiratory system: Clear to auscultation. Respiratory effort normal. Cardiovascular system: S1 & S2 heard, RRR. No JVD, murmurs, rubs, gallops or clicks. No pedal edema. Gastrointestinal system: Abdomen is nondistended, soft and nontender. No organomegaly or masses felt. Normal bowel sounds heard. Central nervous system: Alert and oriented. No focal neurological deficits. Extremities: Symmetric 5 x 5 power. Skin: No rashes, lesions or ulcers        Diet Orders (From admission, onward)     Start     Ordered   08/29/24 1014  DIET DYS 3 Room service  appropriate? Yes; Fluid consistency: Thin  Diet effective now       Question Answer Comment  Room service appropriate? Yes   Fluid consistency: Thin      08/29/24  1014            Objective: Vitals:   08/30/24 1029 08/30/24 1256 08/30/24 1949 08/31/24 0442  BP: 116/67 (!) 142/67 128/82 108/73  Pulse: 71 69 80 75  Resp:      Temp: 98.5 F (36.9 C) 98.1 F (36.7 C) 100.1 F (37.8 C) 98.6 F (37 C)  TempSrc: Oral Oral Oral Oral  SpO2: 98% 96% 96% 97%  Weight:      Height:        Intake/Output Summary (Last 24 hours) at 08/31/2024 0758 Last data filed at 08/31/2024 0503 Gross per 24 hour  Intake 1401 ml  Output --  Net 1401 ml   Filed Weights   08/28/24 1753 08/28/24 2315  Weight: 49.1 kg 47.4 kg    Scheduled Meds:  gabapentin   300 mg Oral BID   insulin  aspart  0-9 Units Subcutaneous TID AC & HS   levothyroxine   125 mcg Oral q morning   potassium chloride   40 mEq Oral Once   rosuvastatin   10 mg Oral Daily   venlafaxine  XR  75 mg Oral Q breakfast   Continuous Infusions:  ceFEPime  (MAXIPIME ) IV 2 g (08/30/24 1717)   potassium chloride       Nutritional status     Body mass index is 17.92 kg/m.  Data Reviewed:   CBC: Recent Labs  Lab 08/28/24 1855 08/29/24 0440 08/30/24 0455 08/31/24 0543  WBC 13.7* 10.9* 7.0 7.4  NEUTROABS 11.4*  --  5.2 5.4  HGB 9.6* 8.1* 6.9* 9.0*  HCT 31.7* 26.4* 22.6* 28.7*  MCV 93.5 94.3 94.2 91.1  PLT 330 276 233 224   Basic Metabolic Panel: Recent Labs  Lab 08/28/24 1855 08/29/24 0440 08/30/24 0455 08/31/24 0543  NA 132* 138 141 143  K 5.0 4.2 3.0* 2.3*  CL 97* 106 112* 110  CO2 17* 14* 16* 22  GLUCOSE 115* 86 81 105*  BUN 25* 25* 15 11  CREATININE 2.26* 2.21* 1.85* 1.58*  CALCIUM  8.8* 7.5* 6.7* 7.2*  MG  --  1.5* 2.0  --   PHOS  --  3.5  --   --    GFR: Estimated Creatinine Clearance: 25.9 mL/min (A) (by C-G formula based on SCr of 1.58 mg/dL (H)). Liver Function Tests: Recent Labs  Lab 08/28/24 1855  AST 21  ALT 8  ALKPHOS 116  BILITOT 0.7  PROT 6.4*  ALBUMIN 3.4*   No results for input(s): LIPASE, AMYLASE in the last 168 hours. No results for input(s):  AMMONIA in the last 168 hours. Coagulation Profile: No results for input(s): INR, PROTIME in the last 168 hours. Cardiac Enzymes: No results for input(s): CKTOTAL, CKMB, CKMBINDEX, TROPONINI in the last 168 hours. BNP (last 3 results) No results for input(s): PROBNP in the last 8760 hours. HbA1C: Recent Labs    08/28/24 1855  HGBA1C 6.8*   CBG: Recent Labs  Lab 08/30/24 0722 08/30/24 1109 08/30/24 1612 08/30/24 2143 08/31/24 0737  GLUCAP 73 118* 96 110* 106*   Lipid Profile: No results for input(s): CHOL, HDL, LDLCALC, TRIG, CHOLHDL, LDLDIRECT in the last 72 hours. Thyroid  Function Tests: No results for input(s): TSH, T4TOTAL, FREET4, T3FREE, THYROIDAB in the last 72 hours. Anemia Panel: No results for input(s): VITAMINB12, FOLATE, FERRITIN, TIBC, IRON, RETICCTPCT in  the last 72 hours. Sepsis Labs: Recent Labs  Lab 08/28/24 1855  LATICACIDVEN 1.1    Recent Results (from the past 240 hours)  Culture, blood (single)     Status: Abnormal (Preliminary result)   Collection Time: 08/28/24  6:55 PM   Specimen: BLOOD  Result Value Ref Range Status   Specimen Description   Final    BLOOD RIGHT ANTECUBITAL Performed at [facility], [address] [place], [place] [address]    Special Requests   Final    BOTTLES DRAWN AEROBIC AND ANAEROBIC Blood Culture adequate volume Performed at [facility], [address], [place], [place] [address]    Culture  Setup Time   Final    AEROBIC BOTTLE ONLY GRAM NEGATIVE RODS Gram Stain Report Called to,Read Back By and Verified With: J COLLINS AT 1147 ON 12.04.25 BY ADGER J  CRITICAL RESULT CALLED TO, READ BACK BY AND VERIFIED WITH: RN JOANNA COLLINS ON 08/29/24 @ 1835 BY DRT    Culture (A)  Final    KLEBSIELLA PNEUMONIAE SUSCEPTIBILITIES TO FOLLOW Performed at [facility], [address], [place], [place] [address]    Report Status PENDING  Incomplete  Blood Culture ID Panel  (Reflexed)     Status: Abnormal   Collection Time: 08/28/24  6:55 PM  Result Value Ref Range Status   Enterococcus faecalis NOT DETECTED NOT DETECTED Final   Enterococcus Faecium NOT DETECTED NOT DETECTED Final   Listeria monocytogenes NOT DETECTED NOT DETECTED Final   Staphylococcus species NOT DETECTED NOT DETECTED Final   Staphylococcus aureus (BCID) NOT DETECTED NOT DETECTED Final   Staphylococcus epidermidis NOT DETECTED NOT DETECTED Final   Staphylococcus lugdunensis NOT DETECTED NOT DETECTED Final   Streptococcus species NOT DETECTED NOT DETECTED Final   Streptococcus agalactiae NOT DETECTED NOT DETECTED Final   Streptococcus pneumoniae NOT DETECTED NOT DETECTED Final   Streptococcus pyogenes NOT DETECTED NOT DETECTED Final   A.calcoaceticus-baumannii NOT DETECTED NOT DETECTED Final   Bacteroides fragilis NOT DETECTED NOT DETECTED Final   Enterobacterales DETECTED (A) NOT DETECTED Final    Comment: Enterobacterales represent a large order of gram negative bacteria, not a single organism. CRITICAL RESULT CALLED TO, READ BACK BY AND VERIFIED WITH: RN JOANNA COLLINS ON 08/29/24 @ 1835 BY DRT    Enterobacter cloacae complex NOT DETECTED NOT DETECTED Final   Escherichia coli NOT DETECTED NOT DETECTED Final   Klebsiella aerogenes NOT DETECTED NOT DETECTED Final   Klebsiella oxytoca NOT DETECTED NOT DETECTED Final   Klebsiella pneumoniae DETECTED (A) NOT DETECTED Final    Comment: CRITICAL RESULT CALLED TO, READ BACK BY AND VERIFIED WITH: RN JOANNA COLLINS ON 08/29/24 @ 1835 BY DRT    Proteus species NOT DETECTED NOT DETECTED Final   Salmonella species NOT DETECTED NOT DETECTED Final   Serratia marcescens NOT DETECTED NOT DETECTED Final   Haemophilus influenzae NOT DETECTED NOT DETECTED Final   Neisseria meningitidis NOT DETECTED NOT DETECTED Final   Pseudomonas aeruginosa NOT DETECTED NOT DETECTED Final   Stenotrophomonas maltophilia NOT DETECTED NOT DETECTED Final   Candida  albicans NOT DETECTED NOT DETECTED Final   Candida auris NOT DETECTED NOT DETECTED Final   Candida glabrata NOT DETECTED NOT DETECTED Final   Candida krusei NOT DETECTED NOT DETECTED Final   Candida parapsilosis NOT DETECTED NOT DETECTED Final   Candida tropicalis NOT DETECTED NOT DETECTED Final   Cryptococcus neoformans/gattii NOT DETECTED NOT DETECTED Final   CTX-M ESBL NOT DETECTED NOT DETECTED Final  Carbapenem resistance IMP NOT DETECTED NOT DETECTED Final   Carbapenem resistance KPC NOT DETECTED NOT DETECTED Final   Carbapenem resistance NDM NOT DETECTED NOT DETECTED Final   Carbapenem resist OXA 48 LIKE NOT DETECTED NOT DETECTED Final   Carbapenem resistance VIM NOT DETECTED NOT DETECTED Final    Comment: Performed at [facility], [address], [place], [place] [address]         Radiology Studies: No results found.          LOS: 3 days   Time spent= 40 mins    Deliliah Room, MD [facility]  If 7PM-7AM, please contact night-coverage  08/31/2024, 7:58 AM

## 2024-08-31 NOTE — Plan of Care (Signed)
 Alert and oriented.  Potassium replaced IV.  OOB to chair and ambulatory to bathroom.  Problem: Education: Goal: Knowledge of General Education information will improve Description: Including pain rating scale, medication(s)/side effects and non-pharmacologic comfort measures Outcome: Progressing   Problem: Health Behavior/Discharge Planning: Goal: Ability to manage health-related needs will improve Outcome: Progressing   Problem: Clinical Measurements: Goal: Ability to maintain clinical measurements within normal limits will improve Outcome: Progressing Goal: Will remain free from infection Outcome: Progressing Goal: Diagnostic test results will improve Outcome: Progressing Goal: Respiratory complications will improve Outcome: Progressing Goal: Cardiovascular complication will be avoided Outcome: Progressing   Problem: Activity: Goal: Risk for activity intolerance will decrease Outcome: Progressing   Problem: Nutrition: Goal: Adequate nutrition will be maintained Outcome: Progressing   Problem: Coping: Goal: Level of anxiety will decrease Outcome: Progressing   Problem: Elimination: Goal: Will not experience complications related to bowel motility Outcome: Progressing Goal: Will not experience complications related to urinary retention Outcome: Progressing   Problem: Pain Managment: Goal: General experience of comfort will improve and/or be controlled Outcome: Progressing   Problem: Safety: Goal: Ability to remain free from injury will improve Outcome: Progressing   Problem: Skin Integrity: Goal: Risk for impaired skin integrity will decrease Outcome: Progressing   Problem: Education: Goal: Ability to describe self-care measures that may prevent or decrease complications (Diabetes Survival Skills Education) will improve Outcome: Progressing Goal: Individualized Educational Video(s) Outcome: Progressing   Problem: Coping: Goal: Ability to adjust to  condition or change in health will improve Outcome: Progressing   Problem: Fluid Volume: Goal: Ability to maintain a balanced intake and output will improve Outcome: Progressing   Problem: Health Behavior/Discharge Planning: Goal: Ability to identify and utilize available resources and services will improve Outcome: Progressing Goal: Ability to manage health-related needs will improve Outcome: Progressing   Problem: Metabolic: Goal: Ability to maintain appropriate glucose levels will improve Outcome: Progressing   Problem: Nutritional: Goal: Maintenance of adequate nutrition will improve Outcome: Progressing Goal: Progress toward achieving an optimal weight will improve Outcome: Progressing   Problem: Skin Integrity: Goal: Risk for impaired skin integrity will decrease Outcome: Progressing   Problem: Tissue Perfusion: Goal: Adequacy of tissue perfusion will improve Outcome: Progressing

## 2024-08-31 NOTE — Progress Notes (Signed)
 CRITICAL VALUE STICKER  CRITICAL VALUE: Potassium 2.3  RECEIVER (on-site recipient of call): Porter Kea RN   DATE & TIME NOTIFIED: 08/31/2024 at 0730  MESSENGER (representative from lab): Rutherford HUNT   MD NOTIFIED: Dr. Francie Attending   TIME OF NOTIFICATION: 9267  RESPONSE: Awaiting new orders.     08/31/24 0732  Provider Notification  Provider Name/Title Deliliah Room MD/ Attending  Date Provider Notified 08/31/24  Time Provider Notified 0732  Method of Notification Face-to-face;Rounds  Notification Reason Critical Result  Test performed and critical result (S)  Potassium 2.3  Date Critical Result Received 08/31/24  Time Critical Result Received 0730  Provider response In department  Date of Provider Response 08/31/24  Time of Provider Response [phone]

## 2024-09-01 DIAGNOSIS — A419 Sepsis, unspecified organism: Secondary | ICD-10-CM

## 2024-09-01 DIAGNOSIS — E039 Hypothyroidism, unspecified: Secondary | ICD-10-CM

## 2024-09-01 LAB — CBC WITH DIFFERENTIAL/PLATELET
Abs Immature Granulocytes: 0.02 K/uL (ref 0.00–0.07)
Basophils Absolute: 0 K/uL (ref 0.0–0.1)
Basophils Relative: 0 %
Eosinophils Absolute: 0.1 K/uL (ref 0.0–0.5)
Eosinophils Relative: 2 %
HCT: 30.6 % — ABNORMAL LOW (ref 36.0–46.0)
Hemoglobin: 9.6 g/dL — ABNORMAL LOW (ref 12.0–15.0)
Immature Granulocytes: 0 %
Lymphocytes Relative: 18 %
Lymphs Abs: 1 K/uL (ref 0.7–4.0)
MCH: 28.5 pg (ref 26.0–34.0)
MCHC: 31.4 g/dL (ref 30.0–36.0)
MCV: 90.8 fL (ref 80.0–100.0)
Monocytes Absolute: 0.5 K/uL (ref 0.1–1.0)
Monocytes Relative: 9 %
Neutro Abs: 4.1 K/uL (ref 1.7–7.7)
Neutrophils Relative %: 71 %
Platelets: 234 K/uL (ref 150–400)
RBC: 3.37 MIL/uL — ABNORMAL LOW (ref 3.87–5.11)
RDW: 14.5 % (ref 11.5–15.5)
WBC: 5.8 K/uL (ref 4.0–10.5)
nRBC: 0 % (ref 0.0–0.2)

## 2024-09-01 LAB — BASIC METABOLIC PANEL WITH GFR
Anion gap: 9 (ref 5–15)
BUN: 8 mg/dL (ref 8–23)
CO2: 24 mmol/L (ref 22–32)
Calcium: 7.3 mg/dL — ABNORMAL LOW (ref 8.9–10.3)
Chloride: 109 mmol/L (ref 98–111)
Creatinine, Ser: 1.36 mg/dL — ABNORMAL HIGH (ref 0.44–1.00)
GFR, Estimated: 42 mL/min — ABNORMAL LOW (ref >60)
Glucose, Bld: 129 mg/dL — ABNORMAL HIGH (ref 70–99)
Potassium: 2.4 mmol/L — CL (ref 3.5–5.1)
Sodium: 142 mmol/L (ref 135–145)

## 2024-09-01 LAB — GLUCOSE, CAPILLARY
Glucose-Capillary: 124 mg/dL — ABNORMAL HIGH (ref 70–99)
Glucose-Capillary: 122 mg/dL — ABNORMAL HIGH (ref 70–99)

## 2024-09-01 LAB — MAGNESIUM: Magnesium: 1.4 mg/dL — ABNORMAL LOW (ref 1.7–2.4)

## 2024-09-01 MED ORDER — POTASSIUM CHLORIDE CRYS ER 20 MEQ PO TBCR
40.0000 meq | EXTENDED_RELEASE_TABLET | Freq: Once | ORAL | Status: AC
  Administered 2024-09-01: 40 meq via ORAL
  Filled 2024-09-01: qty 2

## 2024-09-01 MED ORDER — CEFADROXIL 500 MG PO CAPS: 500.0000 mg | ORAL_CAPSULE | Freq: Two times a day (BID) | ORAL | 0 refills | Status: AC

## 2024-09-01 MED ORDER — POTASSIUM CHLORIDE 10 MEQ/100ML IV SOLN
10.0000 meq | INTRAVENOUS | Status: AC
  Administered 2024-09-01 (×4): 10 meq via INTRAVENOUS
  Filled 2024-09-01 (×4): qty 100

## 2024-09-01 MED ORDER — OXYCODONE HCL 5 MG PO TABS: 5.0000 mg | ORAL_TABLET | Freq: Four times a day (QID) | ORAL | 0 refills | Status: AC | PRN

## 2024-09-01 MED ORDER — POTASSIUM CHLORIDE CRYS ER 20 MEQ PO TBCR
40.0000 meq | EXTENDED_RELEASE_TABLET | ORAL | Status: DC
  Administered 2024-09-01: 40 meq via ORAL
  Filled 2024-09-01: qty 2

## 2024-09-01 NOTE — Discharge Summary (Signed)
 Physician Discharge Summary   Patient: Erica Beasley MRN: [id number] DOB: 1956-04-12  Admit date:     08/28/2024  Discharge date: 09/01/24  Discharge Physician: [name] Nunnery   PCP: Zollie Lowers, MD   Recommendations at discharge:  Repeat CBC to follow hemoglobin trend/stability Repeat basic metabolic panel to follow electrolytes and renal function Reassess blood pressure and adjust medication as needed. Repeat CT chest in 12 months to follow on new pulmonary nodules found during hospitalization. Continue to follow CBG fluctuation/A1c with further adjustment to hypoglycemia regimen as needed (Jardiance  discontinue in the setting of UTI)  Discharge Diagnoses: Principal Problem:   Pyelonephritis Sepsis/Klebsiella bacteremia Hypokalemia Hypothyroidism Hyperlipidemia Type 2 diabetes with polyneuropathy Lung nodule Acute kidney injury Acute on chronic normocytic anemia Physical deconditioning  Brief Narrative:   68 y.o. 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  Diagnosed with sepsis likely secondary to acute right-sided pyonephritis and is currently on intravenous antibiotics.  She was also found to have acute kidney injury and she is on intravenous fluids.   F/u blood culture sensitivities.  12/6: Hypokalemia, ordered repletion   She lives at home with her daughter and walks with the help of walker.  Assessment and Plan: Sepsis, POA:  Klebsiella bacteremia,POA: -Likely secondary to acute right-sided pyelonephritis.  Urinalysis positive for pyuria.  -Findings on CT scan are suggestive of right-sided pyonephritis. -Following patient's culture/sensitivity antibiotics transition to oral cefadroxil  to complete antibiotic therapy -  Patient advised to maintain adequate hydration. -Sepsis features completely resolved at discharge.   Severe hypokalemia:  - Repleted - Patient discharged on daily supplementation.   Nausea and vomiting, POA:  -Likely secondary to acute pyelonephritis.  -Improved/resolved.   - Continue as needed antiemetics and maintain adequate hydration. - Patient tolerating diet at discharge.    Acute Kidney injury, likely prerenal as well as acute tubular necrosis in the setting of sepsis.   -Creatinine of 2.26 with a GFR of 23 on arrival.   - Improved and essentially back to baseline after fluid resuscitation - Continue to maintain adequate hydration - Complete treatment for UTI as mentioned above. - Minimize nephrotoxic agents and follow renal function trend.   New pulmonary nodule in the right lung base, seen on CT scan, POA. -CT scan abdomen pelvis without contrast revealed 6 mm noncalcified subpleural pulmonary nodule in the right lung base, new since prior examination.  Recommend a noncontrast chest CT at 6 to 12 months.   Hypothyroidism,POA:  -Continue with synthroid    Hyperlipidemia,POA:  -Continue with crestor    Diabetes mellitus type II with polyneuropathy,POA: - Continue treatment with gabapentin  for neuropathy - Resume outpatient hypoglycemic regimen.   Acute on chronic normocytic anemia: - No evidence of active bleeding appreciated - Patient received 1 unit PRBCs throughout hospitalization. - Continue to follow hemoglobin trend as an outpatient. -Patient with anemia of chronic disease.   Physical deconditioning,POA: -PT/OT consulted and recommendations received for home health services.  Consultants: None Procedures performed: See below for x-ray reports. Disposition: Home with home health services. Diet recommendation: Heart healthy/modified carbohydrate diet.  DISCHARGE MEDICATION: Allergies as of 09/01/2024       Reactions   Adalimumab    Other reaction(s): no  response Pt is unaware of this Other reaction(s): no response   Methotrexate Other (See Comments)   Other reaction(s):  GI side effects   Sulfasalazine     Other Reaction(s): Anemia   Upadacitinib Other (See Comments)   Other reaction(s): GI side effects        Medication List     STOP taking these medications    etodolac 300 MG capsule Commonly known as: LODINE   Jardiance  10 MG Tabs tablet Generic drug: empagliflozin    leflunomide  20 MG tablet Commonly known as: ARAVA    liothyronine  5 MCG tablet Commonly known as: CYTOMEL        TAKE these medications    Accu-Chek Guide Test test strip Generic drug: glucose blood Check BS twice daily before meals Dx R73.9   albuterol  108 (90 Base) MCG/ACT inhaler Commonly known as: VENTOLIN  HFA Inhale 2 puffs into the lungs every 6 (six) hours as needed for wheezing or shortness of breath.   alendronate  70 MG tablet Commonly known as: FOSAMAX  Take 1 tablet (70 mg total) by mouth every 7 (seven) days. Take with a full glass of water  on an empty stomach.   aspirin  EC 81 MG tablet Take 1 tablet (81 mg total) by mouth daily. Swallow whole.   B-12 500 MCG Tabs Take 500 mcg by mouth every other day.   Blood Glucose Monitoring Suppl Devi 1 each by Does not apply route in the morning, at noon, and at bedtime. May substitute to any manufacturer covered by patient's insurance.   calcium  carbonate 1500 (600 Ca) MG Tabs tablet Commonly known as: OSCAL Take 1,500 mg by mouth 2 (two) times daily with a meal.   candesartan 4 MG tablet Commonly known as: ATACAND Take 4 mg by mouth daily.   cefadroxil  500 MG capsule Commonly known as: DURICEF Take 1 capsule (500 mg total) by mouth 2 (two) times daily for 7 days.   colchicine 0.6 MG tablet Take 0.6 mg by mouth daily.   desvenlafaxine  50 MG 24 hr tablet Commonly known as: Pristiq  Take 1 tablet (50 mg total) by mouth daily.   fluticasone  50 MCG/ACT nasal spray Commonly known as:  FLONASE  Place 2 sprays into both nostrils daily.   folic acid  1 MG tablet Commonly known as: FOLVITE  TAKE ONE TABLET ONCE DAILY   gabapentin  300 MG capsule Commonly known as: NEURONTIN  Take 2 capsules (600 mg total) by mouth 2 (two) times daily.   hydroxychloroquine  200 MG tablet Commonly known as: PLAQUENIL  Take 200 mg by mouth 2 (two) times daily.   levothyroxine  125 MCG tablet Commonly known as: SYNTHROID  TAKE ONE TABLET EVERY MORNING   megestrol  40 MG/ML suspension Commonly known as: MEGACE  TAKE 10 ML BY MOUTH TWICE DAILY   metFORMIN  750 MG 24 hr tablet Commonly known as: GLUCOPHAGE -XR Take 1 tablet (750 mg total) by mouth 2 (two) times daily with a meal.   ondansetron  4 MG disintegrating tablet Commonly known as: ZOFRAN -ODT Take 1 tablet (4 mg total) by mouth every 8 (eight) hours as needed for nausea or vomiting.   oxyCODONE  5 MG immediate release tablet Commonly known as: Oxy IR/ROXICODONE  Take 1 tablet (5 mg total) by mouth every 6 (six) hours as needed for severe pain (pain score 7-10).   potassium chloride  SA 20 MEQ tablet Commonly known as: KLOR-CON  M Take 1 tablet (20 mEq total) by mouth daily.   predniSONE  5 MG tablet Commonly known as: DELTASONE  Take 5-10 mg by mouth daily.   rosuvastatin  40 MG tablet Commonly known as: CRESTOR  Take 1 tablet (40 mg total) by mouth daily.   traZODone  100 MG tablet Commonly  known as: DESYREL  Take 1 tablet (100 mg total) by mouth at bedtime as needed. for sleep               Durable Medical Equipment  (From admission, onward)           Start     Ordered   08/29/24 1136  For home use only DME 3 n 1  Once        08/29/24 1135            Contact information for follow-up providers     Zollie Lowers, MD. Schedule an appointment as soon as possible for a visit in 10 day(s).   Specialty: Family Medicine Contact information: [address] [place] [address] [phone]               Contact information for after-discharge care     Home Medical Care     [facility]) .   Service: Home Health Services Contact information: [address] [place]  [address] [phone]                    Discharge Exam: Fredricka Weights   08/28/24 1753 08/28/24 2315  Weight: 49.1 kg 47.4 kg    General exam: Appears calm and comfortable  Respiratory system: Clear to auscultation. Respiratory effort normal. Cardiovascular system: S1 & S2 heard, RRR. No JVD, murmurs, rubs, gallops or clicks. No pedal edema. Gastrointestinal system: Abdomen is nondistended, soft and nontender. No organomegaly or masses felt. Normal bowel sounds heard. Central nervous system: Alert and oriented. No focal neurological deficits. Extremities: Symmetric 5 x 5 power. Skin: No rashes, lesions or ulcers  Condition at discharge: Stable and improved.  The results of significant diagnostics from this hospitalization (including imaging, microbiology, ancillary and laboratory) are listed below for reference.   Imaging Studies: CT ABDOMEN PELVIS WO CONTRAST Result Date: 08/28/2024 EXAM: CT ABDOMEN AND PELVIS WITHOUT CONTRAST 08/28/2024 08:14:00 PM TECHNIQUE: CT of the abdomen and pelvis was performed without the administration of intravenous contrast. Multiplanar reformatted images are provided for review. Automated exposure control, iterative reconstruction, and/or weight-based adjustment of the mA/kV was utilized to reduce the radiation dose to as low as reasonably achievable. COMPARISON: 05/11/2020. CT chest of 01/04/2023. CLINICAL HISTORY: Abdominal pain, acute, nonlocalized. FINDINGS: LOWER CHEST: 6 mm noncalcified subpleural pulmonary nodule noted within the visualized right lung base, indeterminate, but new since prior examination and CT chest of 01/04/2023. Per Fleischner Society Guidelines recommend a non-contrast chest CT at 6-12 months; if patient is  high risk for malignancy, consider an additional non-contrast chest CT at 18-24 months. If patient is low risk for malignancy, non-contrast chest CT at 18-24 months is optional. LIVER: The liver is unremarkable. GALLBLADDER AND BILE DUCTS: There is gallbladder wall thickening noted, a nonspecific finding but can be seen with hydrops, cholesterolosis, or chronic inflammatory change. No superimposed acute pericholecystic inflammatory changes are identified. No biliary ductal dilatation. SPLEEN: 7 mm hypodense nodule within the inferior pole of the spleen is new from the prior examination. It is indeterminate, possibly representing a small cyst or hemangioma. PANCREAS: No acute abnormality. ADRENAL GLANDS: No acute abnormality. KIDNEYS, URETERS AND BLADDER: The kidneys are normal in size and position. Mild asymmetric right perinephric stranding is present, which may relate to an asymmetric inflammatory process, as can be seen with pyelonephritis. There is minimal right hydronephrosis. No intrarenal or ureteral calculi. The left kidney is unremarkable. There is moderate  nondependent gas within the bladder lumen, finding that may relate to recent instrumentation or infection. The bladder is decompressed. Correlation with urinalysis and urine culture may be helpful for further management. GI AND BOWEL: Mild sigmoid diverticulosis. The stomach, small bowel, and large bowel are otherwise unremarkable. The appendix is normal. There is no bowel obstruction. PERITONEUM AND RETROPERITONEUM: No ascites. No free air. VASCULATURE: Extensive right coronary artery calcification. Extensive aortoiliac atherosclerotic calcifications. Aorta is normal in caliber. No aortic aneurysm. LYMPH NODES: No lymphadenopathy. REPRODUCTIVE ORGANS: No acute abnormality. BONES AND SOFT TISSUES: Small fat-containing left inguinal hernia. Osseous structures are age appropriate. No acute bone abnormality. No lytic or blastic bone lesion. IMPRESSION: 1. 6  mm noncalcified subpleural pulmonary nodule in the right lung base, new since prior examination; per Fleischner Society Guidelines, recommend a non-contrast chest CT at 612 months, and if high risk for malignancy consider an additional non-contrast chest CT at 1824 months; if low risk for malignancy, non-contrast chest CT at 1824 months is optional. 2. Gallbladder wall thickening without superimposed acute pericholecystic inflammatory changes. See differential considerations above. Correlation with liver function tests may be helpful. 3. Mild asymmetric right perinephric stranding. Moderate nondependent gas within the bladder lumen. The finding could be seen in the setting of infectious cystitis/ascending urinary tract infection and correlation with urinalysis and urine culture may be helpful for further management. Electronically signed by: Dorethia Molt MD 08/28/2024 09:14 PM EST RP Workstation: [id number]   DG Chest 2 View Result Date: 08/28/2024 CLINICAL DATA:  Weakness. EXAM: CHEST - 2 VIEW COMPARISON:  01/04/2023 FINDINGS: Lung volumes are low. The heart is normal in size for technique. Mediastinal contours are normal. Coarsened lung markings which appear chronic. No confluent opacity, pleural effusion or pneumothorax. Mild gaseous distension of included upper abdominal bowel loops. Bilateral shoulder arthropathy. IMPRESSION: Low lung volumes without acute abnormality. Electronically Signed   By: Andrea Gasman M.D.   On: 08/28/2024 18:23    Microbiology: Results for orders placed or performed during the hospital encounter of 08/28/24  Culture, blood (single)     Status: Abnormal   Collection Time: 08/28/24  6:55 PM   Specimen: BLOOD  Result Value Ref Range Status   Specimen Description   Final    BLOOD RIGHT ANTECUBITAL Performed at [facility], [address] [place], [place] [address]    Special Requests   Final    BOTTLES DRAWN AEROBIC AND ANAEROBIC Blood Culture adequate  volume Performed at [facility], [address] [place], [place] [address]    Culture  Setup Time   Final    AEROBIC BOTTLE ONLY GRAM NEGATIVE RODS Gram Stain Report Called to,Read Back By and Verified With: J COLLINS AT 1147 ON 12.04.25 BY ADGER J  CRITICAL RESULT CALLED TO, READ BACK BY AND VERIFIED WITH: RN PHILIPPE COLLINS ON 08/29/24 @ 1835 BY DRT Performed at [facility], [address] [place], [place] [address]    Culture KLEBSIELLA PNEUMONIAE (A)  Final   Report Status 08/31/2024 FINAL  Final   Organism ID, Bacteria KLEBSIELLA PNEUMONIAE  Final      Susceptibility   Klebsiella pneumoniae - MIC*    AMPICILLIN RESISTANT Resistant     CEFAZOLIN (NON-URINE) 2 SENSITIVE Sensitive     CEFEPIME  <=0.12 SENSITIVE Sensitive     ERTAPENEM <=0.12 SENSITIVE Sensitive     CEFTRIAXONE  <=0.25 SENSITIVE Sensitive     CIPROFLOXACIN  <=0.06 SENSITIVE Sensitive     GENTAMICIN <=1 SENSITIVE Sensitive  MEROPENEM <=0.25 SENSITIVE Sensitive     TRIMETH/SULFA <=20 SENSITIVE Sensitive     AMPICILLIN/SULBACTAM <=2 SENSITIVE Sensitive     PIP/TAZO Value in next row Sensitive      <=4 SENSITIVEThis is a modified FDA-approved test that has been validated and its performance characteristics determined by the reporting laboratory.  This laboratory is certified under the Clinical Laboratory Improvement Amendments CLIA as qualified to perform high complexity clinical laboratory testing.    * KLEBSIELLA PNEUMONIAE  Blood Culture ID Panel (Reflexed)     Status: Abnormal   Collection Time: 08/28/24  6:55 PM  Result Value Ref Range Status   Enterococcus faecalis NOT DETECTED NOT DETECTED Final   Enterococcus Faecium NOT DETECTED NOT DETECTED Final   Listeria monocytogenes NOT DETECTED NOT DETECTED Final   Staphylococcus species NOT DETECTED NOT DETECTED Final   Staphylococcus aureus (BCID) NOT DETECTED NOT DETECTED Final   Staphylococcus epidermidis NOT DETECTED NOT DETECTED Final   Staphylococcus  lugdunensis NOT DETECTED NOT DETECTED Final   Streptococcus species NOT DETECTED NOT DETECTED Final   Streptococcus agalactiae NOT DETECTED NOT DETECTED Final   Streptococcus pneumoniae NOT DETECTED NOT DETECTED Final   Streptococcus pyogenes NOT DETECTED NOT DETECTED Final   A.calcoaceticus-baumannii NOT DETECTED NOT DETECTED Final   Bacteroides fragilis NOT DETECTED NOT DETECTED Final   Enterobacterales DETECTED (A) NOT DETECTED Final    Comment: Enterobacterales represent a large order of gram negative bacteria, not a single organism. CRITICAL RESULT CALLED TO, READ BACK BY AND VERIFIED WITH: RN JOANNA COLLINS ON 08/29/24 @ 1835 BY DRT    Enterobacter cloacae complex NOT DETECTED NOT DETECTED Final   Escherichia coli NOT DETECTED NOT DETECTED Final   Klebsiella aerogenes NOT DETECTED NOT DETECTED Final   Klebsiella oxytoca NOT DETECTED NOT DETECTED Final   Klebsiella pneumoniae DETECTED (A) NOT DETECTED Final    Comment: CRITICAL RESULT CALLED TO, READ BACK BY AND VERIFIED WITH: RN JOANNA COLLINS ON 08/29/24 @ 1835 BY DRT    Proteus species NOT DETECTED NOT DETECTED Final   Salmonella species NOT DETECTED NOT DETECTED Final   Serratia marcescens NOT DETECTED NOT DETECTED Final   Haemophilus influenzae NOT DETECTED NOT DETECTED Final   Neisseria meningitidis NOT DETECTED NOT DETECTED Final   Pseudomonas aeruginosa NOT DETECTED NOT DETECTED Final   Stenotrophomonas maltophilia NOT DETECTED NOT DETECTED Final   Candida albicans NOT DETECTED NOT DETECTED Final   Candida auris NOT DETECTED NOT DETECTED Final   Candida glabrata NOT DETECTED NOT DETECTED Final   Candida krusei NOT DETECTED NOT DETECTED Final   Candida parapsilosis NOT DETECTED NOT DETECTED Final   Candida tropicalis NOT DETECTED NOT DETECTED Final   Cryptococcus neoformans/gattii NOT DETECTED NOT DETECTED Final   CTX-M ESBL NOT DETECTED NOT DETECTED Final   Carbapenem resistance IMP NOT DETECTED NOT DETECTED Final    Carbapenem resistance KPC NOT DETECTED NOT DETECTED Final   Carbapenem resistance NDM NOT DETECTED NOT DETECTED Final   Carbapenem resist OXA 48 LIKE NOT DETECTED NOT DETECTED Final   Carbapenem resistance VIM NOT DETECTED NOT DETECTED Final    Comment: Performed at [facility], [address] [place], [place] [address]    Labs: CBC: Recent Labs  Lab 08/28/24 1855 08/29/24 0440 08/30/24 0455 08/31/24 0543 09/01/24 0524  WBC 13.7* 10.9* 7.0 7.4 5.8  NEUTROABS 11.4*  --  5.2 5.4 4.1  HGB 9.6* 8.1* 6.9* 9.0* 9.6*  HCT 31.7* 26.4* 22.6* 28.7* 30.6*  MCV 93.5 94.3 94.2  91.1 90.8  PLT 330 276 233 224 234   Basic Metabolic Panel: Recent Labs  Lab 08/28/24 1855 08/29/24 0440 08/30/24 0455 08/31/24 0543 09/01/24 0524  NA 132* 138 141 143 142  K 5.0 4.2 3.0* 2.3* 2.4*  CL 97* 106 112* 110 109  CO2 17* 14* 16* 22 24  GLUCOSE 115* 86 81 105* 129*  BUN 25* 25* 15 11 8   CREATININE 2.26* 2.21* 1.85* 1.58* 1.36*  CALCIUM  8.8* 7.5* 6.7* 7.2* 7.3*  MG  --  1.5* 2.0 1.6* 1.4*  PHOS  --  3.5  --   --   --    Liver Function Tests: Recent Labs  Lab 08/28/24 1855  AST 21  ALT 8  ALKPHOS 116  BILITOT 0.7  PROT 6.4*  ALBUMIN 3.4*   CBG: Recent Labs  Lab 08/31/24 1114 08/31/24 1613 08/31/24 2112 09/01/24 0832 09/01/24 1208  GLUCAP 123* 157* 87 124* 122*    Discharge time spent:  35 minutes.  Signed: [name] Nunnery, MD [facility] 09/01/2024

## 2024-09-01 NOTE — Progress Notes (Signed)
   09/01/24 9348  Provider Notification  Provider Name/Title Erminio Cone, NP  Date Provider Notified 09/01/24  Time Provider Notified [phone]  Method of Notification Page  Notification Reason Critical Result  Test performed and critical result Potassium 2.4  Date Critical Result Received 09/01/24  Time Critical Result Received [phone]

## 2024-09-02 ENCOUNTER — Other Ambulatory Visit: Payer: Self-pay | Admitting: Family Medicine

## 2024-09-02 ENCOUNTER — Telehealth: Payer: Self-pay | Admitting: *Deleted

## 2024-09-02 DIAGNOSIS — F339 Major depressive disorder, recurrent, unspecified: Secondary | ICD-10-CM

## 2024-09-02 NOTE — Transitions of Care (Post Inpatient/ED Visit) (Signed)
   09/02/2024  Name: Erica Beasley MRN: [id number] DOB: Jan 04, 1956  Today's TOC FU Call Status: Today's TOC FU Call Status:: Unsuccessful Call (1st Attempt) Unsuccessful Call (1st Attempt) Date: 09/02/24  Attempted to reach the patient regarding the most recent Inpatient/ED visit.  Follow Up Plan: Additional outreach attempts will be made to reach the patient to complete the Transitions of Care (Post Inpatient/ED visit) call.   Mliss Creed [facility], BSN RN Care Manager/ Transition of Care [place]/ [facility] [phone]

## 2024-09-03 ENCOUNTER — Telehealth: Payer: Self-pay

## 2024-09-03 NOTE — Transitions of Care (Post Inpatient/ED Visit) (Signed)
 09/03/2024  Name: Erica Beasley MRN: [id number] DOB: April 23, 1956  Today's TOC FU Call Status: Today's TOC FU Call Status:: Successful TOC FU Call Completed TOC FU Call Complete Date: 09/03/24  Patient's Name and Date of Birth confirmed. Name, DOB  Transition Care Management Follow-up Telephone Call How have you been since you were released from the hospital?: Better (daughter reports patient is doing better and less confused.) Any questions or concerns?: No  Items Reviewed: Did you receive and understand the discharge instructions provided?: Yes Medications obtained,verified, and reconciled?: Yes (Medications Reviewed) Any new allergies since your discharge?: No Dietary orders reviewed?: Yes Type of Diet Ordered:: carb modified heart health Do you have support at home?: Yes People in Home [RPT]: child(ren), adult  Medications Reviewed Today: Medications Reviewed Today     Reviewed by Rumalda Alan PENNER, RN (Registered Nurse) on 09/03/24 at 1310  Med List Status: <None>   Medication Order Taking? Sig Documenting Provider Last Dose Status Informant  albuterol  (VENTOLIN  HFA) 108 (90 Base) MCG/ACT inhaler [id number] Yes Inhale 2 puffs into the lungs every 6 (six) hours as needed for wheezing or shortness of breath. [provider]  Active Self, Pharmacy Records  alendronate  (FOSAMAX ) 70 MG tablet [id number] Yes Take 1 tablet (70 mg total) by mouth every 7 (seven) days. Take with a full glass of water  on an empty stomach. Zollie Lowers, MD  Active Self, Pharmacy Records  aspirin  EC 81 MG tablet [id number] Yes Take 1 tablet (81 mg total) by mouth daily. Swallow whole. Willette Adriana DELENA, MD  Active Self, Pharmacy Records  Blood Glucose Monitoring Suppl DEVI [id number] Yes 1 each by Does not apply route in the morning, at noon, and at bedtime. May substitute to any manufacturer covered by patient's insurance. Zollie Lowers, MD  Active Self, Pharmacy Records  calcium  carbonate (OSCAL)  1500 (600 Ca) MG TABS tablet [id number] Yes Take 1,500 mg by mouth 2 (two) times daily with a meal. [provider]  Active Self, Pharmacy Records  candesartan (ATACAND) 4 MG tablet [id number] Yes Take 4 mg by mouth daily. [provider]  Active Self, Pharmacy Records  cefadroxil  (DURICEF) 500 MG capsule [id number] Yes Take 1 capsule (500 mg total) by mouth 2 (two) times daily for 7 days. Ricky Fines, MD  Active   colchicine 0.6 MG tablet [id number] Yes Take 0.6 mg by mouth daily. [provider]  Active Self, Pharmacy Records  Cyanocobalamin  (B-12) 500 MCG TABS [id number] Yes Take 500 mcg by mouth every other day. Lamon Pleasant HERO, PA-C  Active Self, Pharmacy Records  desvenlafaxine  (PRISTIQ ) 50 MG 24 hr tablet [id number] Yes Take 1 tablet (50 mg total) by mouth daily. Zollie Lowers, MD  Active Self, Pharmacy Records  fluticasone  (FLONASE ) 50 MCG/ACT nasal spray [id number] Yes Place 2 sprays into both nostrils daily. Vivienne Delon HERO, PA-C  Active Self, Pharmacy Records  folic acid  (FOLVITE ) 1 MG tablet [id number] Yes TAKE ONE TABLET ONCE DAILY Rogers Hai, MD  Active Self, Pharmacy Records  gabapentin  (NEURONTIN ) 300 MG capsule [id number] Yes Take 2 capsules (600 mg total) by mouth 2 (two) times daily. Zollie Lowers, MD  Active Self, Pharmacy Records  glucose blood (ACCU-CHEK GUIDE TEST) test strip [id number] Yes Check BS twice daily before meals Dx R73.9 Zollie Lowers, MD  Active Self, Pharmacy Records  hydroxychloroquine  (PLAQUENIL ) 200 MG tablet [id number] Yes Take 200 mg by mouth 2 (two) times daily. [provider]  Active Self, Pharmacy Records  levothyroxine  (SYNTHROID ) 125 MCG  tablet [id number] Yes TAKE ONE TABLET EVERY MORNING Stacks, Warren, MD  Active Self, Pharmacy Records  megestrol  (MEGACE ) 40 MG/ML suspension [id number] Yes TAKE 10 ML BY MOUTH TWICE DAILY Rogers Hai, MD  Active Self, Pharmacy Records  metFORMIN  (GLUCOPHAGE -XR)  750 MG 24 hr tablet [id number] Yes Take 1 tablet (750 mg total) by mouth 2 (two) times daily with a meal. Zollie Lowers, MD  Active Self, Pharmacy Records  ondansetron  (ZOFRAN -ODT) 4 MG disintegrating tablet [id number] Yes Take 1 tablet (4 mg total) by mouth every 8 (eight) hours as needed for nausea or vomiting. Gladis Elsie BROCKS, PA-C  Active Self, Pharmacy Records  oxyCODONE  (OXY IR/ROXICODONE ) 5 MG immediate release tablet [id number] Yes Take 1 tablet (5 mg total) by mouth every 6 (six) hours as needed for severe pain (pain score 7-10). Ricky Fines, MD  Active   potassium chloride  SA (KLOR-CON  M) 20 MEQ tablet [id number] Yes Take 1 tablet (20 mEq total) by mouth daily. Lamon Pleasant HERO, PA-C  Active Self, Pharmacy Records  predniSONE  (DELTASONE ) 5 MG tablet [id number] Yes Take 5-10 mg by mouth daily. [provider]  Active Self, Pharmacy Records  rosuvastatin  (CRESTOR ) 40 MG tablet [id number] Yes Take 1 tablet (40 mg total) by mouth daily. Zollie Lowers, MD  Active Self, Pharmacy Records  traZODone  (DESYREL ) 100 MG tablet [id number] Yes TAKE ONE TABLET AT BEDTIME AS NEEDED FOR SLEEP Zollie Lowers, MD  Active             Home Care and Equipment/Supplies: Were Home Health Services Ordered?: Yes Name of Home Health Agency:: [facility] Has Agency set up a time to come to your home?: Yes First Home Health Visit Date: 09/04/24 Any new equipment or medical supplies ordered?: Yes (delivered to room prior to discharge [facility]) Were you able to get the equipment/medical supplies?: Yes Do you have any questions related to the use of the equipment/supplies?: No  Functional Questionnaire: Do you need assistance with bathing/showering or dressing?: No Do you need assistance with meal preparation?: No Do you need assistance with eating?: No Do you have difficulty maintaining continence: No Do you need assistance with getting out of bed/getting out of a chair/moving?: No Do you have difficulty  managing or taking your medications?: No  Follow up appointments reviewed: PCP Follow-up appointment confirmed?: No MD Provider Line Number:[phone] Given: No Specialist Hospital Follow-up appointment confirmed?: Yes Date of Specialist follow-up appointment?: 10/30/24 Follow-Up Specialty Provider:: oncology urology Do you need transportation to your follow-up appointment?: No Do you understand care options if your condition(s) worsen?: Yes-patient verbalized understanding  SDOH Interventions Today    Flowsheet Row Most Recent Value  SDOH Interventions   Food Insecurity Interventions Intervention Not Indicated  Housing Interventions Intervention Not Indicated  Transportation Interventions Intervention Not Indicated  Utilities Interventions Intervention Not Indicated    Goals Addressed             This Visit's Progress    [facility] Transitions of Care (TOC) Care Plan       Problems:  Recent Hospitalization for treatment of pyleonephritis No Hospital Follow Up Provider appointment   Goal:  Over the next 30 days, the patient will not experience hospital readmission  Interventions:  Transitions of Care: Doctor Visits  - discussed the importance of doctor visits Reviewed discharge instructions.  Reviewed all discharge medications and encouraged patient to complete the full course. Reviewed importance of hydration  Reviewed importance of good nutrition Reviewed importance of CBG monitoring  Reviewed option of nutritional supplements-  daughter states patient refused.  Reviewed fall prevention. Reviewed and offered 30 day TOC program- daughter consented Scheduled follow up call for next week Provided my contact information for daughter to call me if needed.  Patient Self Care Activities:  Attend all scheduled provider appointments Call pharmacy for medication refills 3-7 days in advance of running out of medications Call provider office for new concerns or questions  Notify  RN Care Manager of [facility] call rescheduling needs Participate in Transition of Care Program/Attend TOC scheduled calls Take medications as prescribed    Plan:  Telephone follow up appointment with care management team member scheduled for:  09/10/2024 at 2pm The patient has been provided with contact information for the care management team and has been advised to call with any health related questions or concerns.         Alan Ee, RN, BSN, CEN [employer]- Transition of Care Team.  [facility] [phone]

## 2024-09-06 ENCOUNTER — Telehealth: Payer: Self-pay | Admitting: Family Medicine

## 2024-09-06 NOTE — Telephone Encounter (Signed)
 Copied from CRM #[id number]. Topic: Clinical - Home Health Verbal Orders >> Sep 06, 2024  2:07 PM Susanna ORN wrote: Caller/Agency: Morna, PT w/ [facility] Callback Number: [phone] Service Requested: Physical Therapy Frequency: twice a week for one week & then once a week for three weeks.  Any new concerns about the patient? Yes, states they were reviewing medication list and Colchicine is listed and patient is wondering if she should be taking it?

## 2024-09-06 NOTE — Telephone Encounter (Signed)
 Verbal given aware she tool the colchicine for gout flare ups.

## 2024-09-10 ENCOUNTER — Telehealth: Payer: Self-pay

## 2024-09-11 ENCOUNTER — Inpatient Hospital Stay

## 2024-09-11 ENCOUNTER — Inpatient Hospital Stay: Attending: Hematology

## 2024-09-11 VITALS — BP 116/61 | HR 80 | Temp 98.0°F | Resp 18

## 2024-09-11 DIAGNOSIS — D638 Anemia in other chronic diseases classified elsewhere: Secondary | ICD-10-CM | POA: Insufficient documentation

## 2024-09-11 DIAGNOSIS — M069 Rheumatoid arthritis, unspecified: Secondary | ICD-10-CM | POA: Diagnosis present

## 2024-09-11 DIAGNOSIS — M810 Age-related osteoporosis without current pathological fracture: Secondary | ICD-10-CM

## 2024-09-11 DIAGNOSIS — N1831 Chronic kidney disease, stage 3a: Secondary | ICD-10-CM

## 2024-09-11 LAB — CBC
HCT: 31.8 % — ABNORMAL LOW (ref 36.0–46.0)
Hemoglobin: 9.6 g/dL — ABNORMAL LOW (ref 12.0–15.0)
MCH: 28.9 pg (ref 26.0–34.0)
MCHC: 30.2 g/dL (ref 30.0–36.0)
MCV: 95.8 fL (ref 80.0–100.0)
Platelets: 353 K/uL (ref 150–400)
RBC: 3.32 MIL/uL — ABNORMAL LOW (ref 3.87–5.11)
RDW: 14.4 % (ref 11.5–15.5)
WBC: 7.9 K/uL (ref 4.0–10.5)
nRBC: 0 % (ref 0.0–0.2)

## 2024-09-11 LAB — SAMPLE TO BLOOD BANK

## 2024-09-11 MED ORDER — EPOETIN ALFA-EPBX 10000 UNIT/ML IJ SOLN
10000.0000 [IU] | Freq: Once | INTRAMUSCULAR | Status: AC
  Administered 2024-09-11: 14:00:00 10000 [IU] via SUBCUTANEOUS
  Filled 2024-09-11: qty 1

## 2024-09-11 NOTE — Patient Instructions (Signed)

## 2024-09-11 NOTE — Progress Notes (Signed)
Patient presents today for Retacrit injection. Hemoglobin reviewed prior to administration. VSS tolerated without incident or complaint. See MAR for details. Patient stable during and after injection. Patient discharged in satisfactory condition with no s/s of distress noted.  

## 2024-09-12 ENCOUNTER — Ambulatory Visit (INDEPENDENT_AMBULATORY_CARE_PROVIDER_SITE_OTHER): Admitting: Family Medicine

## 2024-09-12 ENCOUNTER — Ambulatory Visit

## 2024-09-12 ENCOUNTER — Encounter: Payer: Self-pay | Admitting: Family Medicine

## 2024-09-12 VITALS — BP 137/73 | HR 68 | Temp 97.7°F | Ht 64.0 in | Wt 107.0 lb

## 2024-09-12 DIAGNOSIS — Z7984 Long term (current) use of oral hypoglycemic drugs: Secondary | ICD-10-CM | POA: Diagnosis not present

## 2024-09-12 DIAGNOSIS — N12 Tubulo-interstitial nephritis, not specified as acute or chronic: Secondary | ICD-10-CM | POA: Diagnosis not present

## 2024-09-12 DIAGNOSIS — E1169 Type 2 diabetes mellitus with other specified complication: Secondary | ICD-10-CM | POA: Diagnosis not present

## 2024-09-12 DIAGNOSIS — R911 Solitary pulmonary nodule: Secondary | ICD-10-CM | POA: Diagnosis not present

## 2024-09-12 DIAGNOSIS — E756 Lipid storage disorder, unspecified: Secondary | ICD-10-CM

## 2024-09-12 LAB — URINALYSIS
Bilirubin, UA: NEGATIVE
Glucose, UA: NEGATIVE
Ketones, UA: NEGATIVE
Nitrite, UA: NEGATIVE
RBC, UA: NEGATIVE
Specific Gravity, UA: 1.015 (ref 1.005–1.030)
Urobilinogen, Ur: 0.2 mg/dL (ref 0.2–1.0)
pH, UA: 6.5 (ref 5.0–7.5)

## 2024-09-12 MED ORDER — LINAGLIPTIN 5 MG PO TABS: 5.0000 mg | ORAL_TABLET | Freq: Every day | ORAL | 1 refills | Status: AC

## 2024-09-12 MED ORDER — NITROFURANTOIN MACROCRYSTAL 50 MG PO CAPS: 50.0000 mg | ORAL_CAPSULE | Freq: Every day | ORAL | 3 refills | Status: AC

## 2024-09-12 NOTE — Progress Notes (Unsigned)
 Subjective:  Patient ID: Erica Beasley, female    DOB: 1956/09/05  Age: 68 y.o. MRN: [id number]  CC: Hospitalization Follow-up (No concerns )   HPI  Discussed the use of AI scribe software for clinical note transcription with the patient, who gave verbal consent to proceed.  History of Present Illness           09/12/2024    2:17 PM 06/27/2024   10:28 AM 05/30/2024    1:29 PM  Depression screen PHQ 2/9  Decreased Interest 0 0 0  Down, Depressed, Hopeless 0 0 0  PHQ - 2 Score 0 0 0  Altered sleeping 0    Tired, decreased energy 0    Change in appetite 0    Feeling bad or failure about yourself  0    Trouble concentrating 0    Moving slowly or fidgety/restless 0    Suicidal thoughts 0    PHQ-9 Score 0    Difficult doing work/chores Not difficult at all  Not difficult at all    History Michiah has a past medical history of Aortic atherosclerosis (11/13/2020), CKD (chronic kidney disease) stage 2, GFR 60-89 ml/min, COPD (chronic obstructive pulmonary disease) (HCC), COVID-19 (06/2019), Depression, Essential hypertension, GERD (gastroesophageal reflux disease), H/O Clostridium difficile infection (05/03/2019), History of diabetes mellitus, colonic polyps (05/03/2019), Hyperlipidemia, Hypothyroidism, Neuropathy, Rheumatoid arthritis (HCC), 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   She has a past surgical history that includes Yag laser application (Left, 07/27/2015); Colonoscopy (09/2010); Colonoscopy with propofol  (N/A, 09/16/2019); biopsy (09/16/2019); Esophagogastroduodenoscopy (egd) with propofol  (N/A, 04/28/2020); biopsy (04/28/2020); Cataract extraction (Bilateral); Colonoscopy with propofol  (N/A, 06/08/2020); biopsy (06/08/2020); and Givens capsule study (N/A, 08/12/2022).   Her family history includes Breast cancer in her mother; Diabetes in her mother; Heart disease in her mother; Migraines in her daughter;  Other in her father; Stroke in her mother.She reports that she quit smoking about 7 years ago. Her smoking use included cigarettes. She started smoking about 22 years ago. She has a 15 Beasley-year smoking history. She has been exposed to tobacco smoke. She has quit using smokeless tobacco. She reports that she does not drink alcohol and does not use drugs.    ROS Review of Systems  Objective:  BP 137/73   Pulse 68   Temp 97.7 F (36.5 C)   Ht 5' 4 (1.626 m)   Wt 107 lb (48.5 kg)   SpO2 99%   BMI 18.37 kg/m   BP Readings from Last 3 Encounters:  09/12/24 137/73  09/11/24 116/61  09/01/24 (!) 95/57    Wt Readings from Last 3 Encounters:  09/12/24 107 lb (48.5 kg)  08/28/24 104 lb 6.4 oz (47.4 kg)  06/27/24 108 lb 3.9 oz (49.1 kg)     Physical Exam Physical Exam    Assessment & Plan:  Pyelonephritis -     CBC with Differential/Platelet -     CMP14+EGFR -     Urinalysis -     Urine Culture  Lung nodule -     Ambulatory Referral for Lung Cancer Scre  Diabetic lipidosis (HCC) -     CMP14+EGFR -     Urinalysis -     Urine Culture  Lung nodule seen on imaging study -     Ambulatory Referral for Lung Cancer Scre  Other orders -     Nitrofurantoin  Macrocrystal; Take 1 capsule (50 mg total) by mouth daily.  Dispense: 90 capsule;  Refill: 3 -     linaGLIPtin ; Take 1 tablet (5 mg total) by mouth daily. For diabetes  Dispense: 90 tablet; Refill: 1    Assessment and Plan Assessment & Plan        Follow-up: Return in about 6 weeks (around 10/24/2024).  Butler Der, M.D.

## 2024-09-13 ENCOUNTER — Telehealth: Payer: Self-pay | Admitting: Acute Care

## 2024-09-13 DIAGNOSIS — Z122 Encounter for screening for malignant neoplasm of respiratory organs: Secondary | ICD-10-CM

## 2024-09-13 DIAGNOSIS — Z87891 Personal history of nicotine dependence: Secondary | ICD-10-CM

## 2024-09-13 LAB — CBC WITH DIFFERENTIAL/PLATELET
Basophils Absolute: 0 x10E3/uL (ref 0.0–0.2)
Basos: 0 %
EOS (ABSOLUTE): 0 x10E3/uL (ref 0.0–0.4)
Eos: 0 %
Hematocrit: 32.3 % — ABNORMAL LOW (ref 34.0–46.6)
Hemoglobin: 10 g/dL — ABNORMAL LOW (ref 11.1–15.9)
Immature Grans (Abs): 0.1 x10E3/uL (ref 0.0–0.1)
Immature Granulocytes: 1 %
Lymphocytes Absolute: 1 x10E3/uL (ref 0.7–3.1)
Lymphs: 12 %
MCH: 28.7 pg (ref 26.6–33.0)
MCHC: 31 g/dL — ABNORMAL LOW (ref 31.5–35.7)
MCV: 93 fL (ref 79–97)
Monocytes Absolute: 0.3 x10E3/uL (ref 0.1–0.9)
Monocytes: 4 %
Neutrophils Absolute: 6.7 x10E3/uL (ref 1.4–7.0)
Neutrophils: 83 %
Platelets: 389 x10E3/uL (ref 150–450)
RBC: 3.48 x10E6/uL — ABNORMAL LOW (ref 3.77–5.28)
RDW: 13.3 % (ref 11.7–15.4)
WBC: 8.1 x10E3/uL (ref 3.4–10.8)

## 2024-09-13 LAB — CMP14+EGFR
ALT: 13 IU/L (ref 0–32)
AST: 18 IU/L (ref 0–40)
Albumin: 3.5 g/dL — ABNORMAL LOW (ref 3.9–4.9)
Alkaline Phosphatase: 91 IU/L (ref 49–135)
BUN/Creatinine Ratio: 18 (ref 12–28)
BUN: 17 mg/dL (ref 8–27)
Bilirubin Total: 0.2 mg/dL (ref 0.0–1.2)
CO2: 25 mmol/L (ref 20–29)
Calcium: 9.4 mg/dL (ref 8.7–10.3)
Chloride: 101 mmol/L (ref 96–106)
Creatinine, Ser: 0.93 mg/dL (ref 0.57–1.00)
Globulin, Total: 2.2 g/dL (ref 1.5–4.5)
Glucose: 225 mg/dL — ABNORMAL HIGH (ref 70–99)
Potassium: 4.3 mmol/L (ref 3.5–5.2)
Sodium: 141 mmol/L (ref 134–144)
Total Protein: 5.7 g/dL — ABNORMAL LOW (ref 6.0–8.5)
eGFR: 67 mL/min/1.73

## 2024-09-13 NOTE — Telephone Encounter (Signed)
 Lung Cancer Screening Narrative/Criteria Questionnaire (Cigarette Smokers Only- No Cigars/Pipes/vapes)   BLANCH STANG   SDMV:09/23/2024 1030 Natalie       [birth year]   LDCT: 10/08/2024 130p AP    68 y.o.   Phone: [phone] or daughter at [phone]  Lung Screening Narrative (confirm age 33-77 yrs Medicare / 50-80 yrs Private pay insurance)   Insurance information:[employer] mcr and mcd   Referring Provider:Dr. Stacks   This screening involves an initial phone call with a team member from our program. It is called a shared decision making visit. The initial meeting is required by  insurance and Medicare to make sure you understand the program. This appointment takes about 15-20 minutes to complete. You will complete the screening scan at your scheduled date/time.  This scan takes about 5-10 minutes to complete. You can eat and drink normally before and after the scan.  Criteria questions for Lung Cancer Screening:   Are you a current or former smoker? Former Age began smoking: 68yo   If you are a former smoker, what year did you quit smoking? Quit in 2018(within 15 yrs)   To calculate your smoking history, I need an accurate estimate of how many packs of cigarettes you smoked per day and for how many years. (Not just the number of PPD you are now smoking)   Years smoking 46 x Packs per day 1.25 = Pack years 57.5   (at least 20 pack yrs)   (Make sure they understand that we need to know how much they have smoked in the past, not just the number of PPD they are smoking now)  Do you have a personal history of cancer?  No    Do you have a family history of cancer? Yes  (cancer type and and relative) mother - breast  Are you coughing up blood?  No  Have you had unexplained weight loss of 15 lbs or more in the last 6 months? No  It looks like you meet all criteria.  When would be a good time for us  to schedule you for this screening?   Additional information: N/A

## 2024-09-14 LAB — URINE CULTURE: Organism ID, Bacteria: NO GROWTH

## 2024-09-15 ENCOUNTER — Ambulatory Visit: Payer: Self-pay | Admitting: Family Medicine

## 2024-09-16 ENCOUNTER — Ambulatory Visit

## 2024-09-16 DIAGNOSIS — I129 Hypertensive chronic kidney disease with stage 1 through stage 4 chronic kidney disease, or unspecified chronic kidney disease: Secondary | ICD-10-CM

## 2024-09-16 DIAGNOSIS — E1142 Type 2 diabetes mellitus with diabetic polyneuropathy: Secondary | ICD-10-CM

## 2024-09-16 DIAGNOSIS — E876 Hypokalemia: Secondary | ICD-10-CM | POA: Diagnosis not present

## 2024-09-16 DIAGNOSIS — F32A Depression, unspecified: Secondary | ICD-10-CM

## 2024-09-16 DIAGNOSIS — N182 Chronic kidney disease, stage 2 (mild): Secondary | ICD-10-CM | POA: Diagnosis not present

## 2024-09-16 DIAGNOSIS — E872 Acidosis, unspecified: Secondary | ICD-10-CM | POA: Diagnosis not present

## 2024-09-16 DIAGNOSIS — D630 Anemia in neoplastic disease: Secondary | ICD-10-CM

## 2024-09-16 DIAGNOSIS — D631 Anemia in chronic kidney disease: Secondary | ICD-10-CM

## 2024-09-16 DIAGNOSIS — N10 Acute tubulo-interstitial nephritis: Secondary | ICD-10-CM

## 2024-09-16 DIAGNOSIS — E1122 Type 2 diabetes mellitus with diabetic chronic kidney disease: Secondary | ICD-10-CM

## 2024-09-16 DIAGNOSIS — D472 Monoclonal gammopathy: Secondary | ICD-10-CM

## 2024-09-16 DIAGNOSIS — N179 Acute kidney failure, unspecified: Secondary | ICD-10-CM | POA: Diagnosis not present

## 2024-09-23 ENCOUNTER — Ambulatory Visit: Admitting: *Deleted

## 2024-09-23 ENCOUNTER — Encounter: Payer: Self-pay | Admitting: *Deleted

## 2024-09-23 DIAGNOSIS — Z87891 Personal history of nicotine dependence: Secondary | ICD-10-CM | POA: Diagnosis not present

## 2024-09-23 NOTE — Progress Notes (Signed)
" °  Virtual Visit via Telephone Note  I connected with Erica Beasley on 09/23/2024 at 10:30 AM EST by telephone and verified that I am speaking with the correct person using two identifiers.  Location: Patient: at home Provider: [address] [place], [place], [address]    I discussed the limitations, risks, security and privacy concerns of performing an evaluation and management service by telephone and the availability of in person appointments. I also discussed with the patient that there may be a patient responsible charge related to this service. The patient expressed understanding and agreed to proceed.   Shared Decision Making Visit Lung Cancer Screening Program [phone])   Eligibility: Age 68 y.o. Beasley Years Smoking History Calculation 52 (# packs/per year x # years smoked) Recent History of coughing up blood  no Unexplained weight loss? no ( >Than 15 pounds within the last 6 months ) Prior History Lung / other cancer no (Diagnosis within the last 5 years already requiring surveillance chest CT Scans). Smoking Status Former Smoker Former Smokers: Years since quit: 7 years  Quit Date: 2018  Visit Components: Discussion included one or more decision making aids. yes Discussion included risk/benefits of screening. yes Discussion included potential follow up diagnostic testing for abnormal scans. yes Discussion included meaning and risk of over diagnosis. yes Discussion included meaning and risk of False Positives. yes Discussion included meaning of total radiation exposure. yes  Counseling Included: Importance of adherence to annual lung cancer LDCT screening. yes Impact of comorbidities on ability to participate in the program. yes Ability and willingness to under diagnostic treatment. yes  Smoking Cessation Counseling: Current Smokers:  Discussed importance of smoking cessation. yes Information about tobacco cessation classes and interventions provided to  patient. yes Patient provided with ticket for LDCT Scan. no Symptomatic Patient. no  Counseling(Intermediate counseling: > three minutes) 99406 Diagnosis Code: Tobacco Use Z72.0 Asymptomatic Patient yes  Counseling (Intermediate counseling: > three minutes counseling) [id number] Former Smokers:  Discussed the importance of maintaining cigarette abstinence. yes Diagnosis Code: Personal History of Nicotine Dependence. S12.108 Information about tobacco cessation classes and interventions provided to patient. Yes Patient provided with ticket for LDCT Scan. no Written Order for Lung Cancer Screening with LDCT placed in Epic. Yes (CT Chest Lung Cancer Screening Low Dose W/O CM) [id number] Z12.2-Screening of respiratory organs Z87.891-Personal history of nicotine dependence   Laneta Speaks, RN       "

## 2024-09-23 NOTE — Patient Instructions (Signed)

## 2024-10-01 ENCOUNTER — Encounter: Payer: Self-pay | Admitting: Physician Assistant

## 2024-10-02 ENCOUNTER — Telehealth: Admitting: Physician Assistant

## 2024-10-02 DIAGNOSIS — J019 Acute sinusitis, unspecified: Secondary | ICD-10-CM

## 2024-10-02 DIAGNOSIS — B9689 Other specified bacterial agents as the cause of diseases classified elsewhere: Secondary | ICD-10-CM

## 2024-10-02 MED ORDER — AMOXICILLIN-POT CLAVULANATE 875-125 MG PO TABS: 1.0000 | ORAL_TABLET | Freq: Two times a day (BID) | ORAL | 0 refills | Status: AC

## 2024-10-02 NOTE — Progress Notes (Signed)
 E-Visit for Sinus Problems  We are sorry that you are not feeling well.  Here is how we plan to help!  Based on what you have shared with me it looks like you have sinusitis.  Sinusitis is inflammation and infection in the sinus cavities of the head.  Based on your presentation I believe you most likely have Acute Bacterial Sinusitis.  This is an infection caused by bacteria and is treated with antibiotics. I have prescribed Augmentin 875mg /125mg  one tablet twice daily with food, for 7 days. You may use an oral decongestant such as Mucinex D or if you have glaucoma or high blood pressure use plain Mucinex. Saline nasal spray help and can safely be used as often as needed for congestion.  If you develop worsening sinus pain, fever or notice severe headache and vision changes, or if symptoms are not better after completion of antibiotic, please schedule an appointment with a health care provider.    Sinus infections are not as easily transmitted as other respiratory infection, however we still recommend that you avoid close contact with loved ones, especially the very young and elderly.  Remember to wash your hands thoroughly throughout the day as this is the number one way to prevent the spread of infection!  Home Care: Only take medications as instructed by your medical team. Complete the entire course of an antibiotic. Do not take these medications with alcohol. A steam or ultrasonic humidifier can help congestion.  You can place a towel over your head and breathe in the steam from hot water coming from a faucet. Avoid close contacts especially the very young and the elderly. Cover your mouth when you cough or sneeze. Always remember to wash your hands.  Get Help Right Away If: You develop worsening fever or sinus pain. You develop a severe head ache or visual changes. Your symptoms persist after you have completed your treatment plan.  Make sure you Understand these instructions. Will  watch your condition. Will get help right away if you are not doing well or get worse.  Your e-visit answers were reviewed by a board certified advanced clinical practitioner to complete your personal care plan.  Depending on the condition, your plan could have included both over the counter or prescription medications.  If there is a problem please reply  once you have received a response from your provider.  Your safety is important to us .  If you have drug allergies check your prescription carefully.    You can use MyChart to ask questions about today's visit, request a non-urgent call back, or ask for a work or school excuse for 24 hours related to this e-Visit. If it has been greater than 24 hours you will need to follow up with your provider, or enter a new e-Visit to address those concerns.  You will get an e-mail in the next two days asking about your experience.  I hope that your e-visit has been valuable and will speed your recovery. Thank you for using e-visits.  I have spent 5 minutes in review of e-visit questionnaire, review and updating patient chart, medical decision making and response to patient.   Delon CHRISTELLA Dickinson, PA-C

## 2024-10-03 ENCOUNTER — Inpatient Hospital Stay: Attending: Hematology

## 2024-10-03 DIAGNOSIS — E538 Deficiency of other specified B group vitamins: Secondary | ICD-10-CM

## 2024-10-03 DIAGNOSIS — N1831 Chronic kidney disease, stage 3a: Secondary | ICD-10-CM

## 2024-10-03 LAB — IRON AND TIBC
Iron: 35 ug/dL (ref 28–170)
Saturation Ratios: 16 % (ref 10.4–31.8)
TIBC: 213 ug/dL — ABNORMAL LOW (ref 250–450)
UIBC: 178 ug/dL

## 2024-10-03 LAB — CBC WITH DIFFERENTIAL/PLATELET
Abs Immature Granulocytes: 0.02 K/uL (ref 0.00–0.07)
Basophils Absolute: 0 K/uL (ref 0.0–0.1)
Basophils Relative: 0 %
Eosinophils Absolute: 0.2 K/uL (ref 0.0–0.5)
Eosinophils Relative: 3 %
HCT: 34 % — ABNORMAL LOW (ref 36.0–46.0)
Hemoglobin: 10.2 g/dL — ABNORMAL LOW (ref 12.0–15.0)
Immature Granulocytes: 0 %
Lymphocytes Relative: 18 %
Lymphs Abs: 0.9 K/uL (ref 0.7–4.0)
MCH: 28.4 pg (ref 26.0–34.0)
MCHC: 30 g/dL (ref 30.0–36.0)
MCV: 94.7 fL (ref 80.0–100.0)
Monocytes Absolute: 0.4 K/uL (ref 0.1–1.0)
Monocytes Relative: 7 %
Neutro Abs: 3.6 K/uL (ref 1.7–7.7)
Neutrophils Relative %: 72 %
Platelets: 219 K/uL (ref 150–400)
RBC: 3.59 MIL/uL — ABNORMAL LOW (ref 3.87–5.11)
RDW: 15.1 % (ref 11.5–15.5)
WBC: 5 K/uL (ref 4.0–10.5)
nRBC: 0 % (ref 0.0–0.2)

## 2024-10-03 LAB — COMPREHENSIVE METABOLIC PANEL WITH GFR
ALT: 15 U/L (ref 0–44)
AST: 25 U/L (ref 15–41)
Albumin: 3.8 g/dL (ref 3.5–5.0)
Alkaline Phosphatase: 67 U/L (ref 38–126)
Anion gap: 16 — ABNORMAL HIGH (ref 5–15)
BUN: 20 mg/dL (ref 8–23)
CO2: 20 mmol/L — ABNORMAL LOW (ref 22–32)
Calcium: 9.4 mg/dL (ref 8.9–10.3)
Chloride: 102 mmol/L (ref 98–111)
Creatinine, Ser: 1.06 mg/dL — ABNORMAL HIGH (ref 0.44–1.00)
GFR, Estimated: 57 mL/min — ABNORMAL LOW
Glucose, Bld: 157 mg/dL — ABNORMAL HIGH (ref 70–99)
Potassium: 4 mmol/L (ref 3.5–5.1)
Sodium: 139 mmol/L (ref 135–145)
Total Bilirubin: 0.3 mg/dL (ref 0.0–1.2)
Total Protein: 6.2 g/dL — ABNORMAL LOW (ref 6.5–8.1)

## 2024-10-03 LAB — SAMPLE TO BLOOD BANK

## 2024-10-03 LAB — VITAMIN B12: Vitamin B-12: 843 pg/mL (ref 180–914)

## 2024-10-03 LAB — FERRITIN: Ferritin: 1311 ng/mL — ABNORMAL HIGH (ref 11–307)

## 2024-10-06 LAB — METHYLMALONIC ACID, SERUM: Methylmalonic Acid, Quantitative: 211 nmol/L (ref 0–378)

## 2024-10-08 ENCOUNTER — Other Ambulatory Visit: Payer: Self-pay

## 2024-10-08 ENCOUNTER — Encounter: Payer: Self-pay | Admitting: Family Medicine

## 2024-10-08 ENCOUNTER — Ambulatory Visit (HOSPITAL_COMMUNITY)
Admission: RE | Admit: 2024-10-08 | Discharge: 2024-10-08 | Disposition: A | Discharge status: Home / Self Care | Source: Ambulatory Visit | Attending: Acute Care | Admitting: Acute Care

## 2024-10-08 DIAGNOSIS — M0579 Rheumatoid arthritis with rheumatoid factor of multiple sites without organ or systems involvement: Secondary | ICD-10-CM

## 2024-10-08 DIAGNOSIS — Z87891 Personal history of nicotine dependence: Secondary | ICD-10-CM | POA: Insufficient documentation

## 2024-10-08 DIAGNOSIS — N3000 Acute cystitis without hematuria: Secondary | ICD-10-CM

## 2024-10-08 DIAGNOSIS — Z122 Encounter for screening for malignant neoplasm of respiratory organs: Secondary | ICD-10-CM | POA: Diagnosis present

## 2024-10-08 DIAGNOSIS — R911 Solitary pulmonary nodule: Secondary | ICD-10-CM

## 2024-10-08 DIAGNOSIS — R3589 Other polyuria: Secondary | ICD-10-CM

## 2024-10-16 ENCOUNTER — Telehealth: Payer: Self-pay | Admitting: Acute Care

## 2024-10-16 NOTE — Telephone Encounter (Signed)
 Received Call report for LDCT:    IMPRESSION: 1. 6.4 mm lateral right lower lobe nodule is new from 01/04/2023. Lung-RADS 4A, suspicious. Follow up low-dose chest CT without contrast in 3 months (please use the following order, CT CHEST LCS NODULE FOLLOW-UP W/O CM) is recommended. Alternatively, PET may be considered when there is a solid component 8mm or larger. These results will be called to the ordering clinician or representative by the Radiologist Assistant, and communication documented in the PACS or [employer]. 2. Aortic atherosclerosis (ICD10-I70.0). Coronary artery calcification. 3.  Emphysema (ICD10-J43.9).     Electronically Signed   By: Newell Eke M.D.   On: 10/16/2024 14:42

## 2024-10-16 NOTE — Telephone Encounter (Signed)
 Result given to provider.

## 2024-10-16 NOTE — Telephone Encounter (Signed)
 Please call patient and let her know that the LDCT shows a new small nodule. It is 6.4 mm and was not on imaging done in 2024.  Plan will be for a 3 month follow up scan due after 01/06/2025 . Please fax results to PCP and let them know plan of care. Thanks so much.  Aortic atherosclerosis (ICD10-I70.0). Coronary artery calcification. Pt. Is on a statin and has not been seen by cardiology, looks like PCP is managing. Thanks so much.

## 2024-10-17 ENCOUNTER — Other Ambulatory Visit: Payer: Self-pay | Admitting: Family Medicine

## 2024-10-17 ENCOUNTER — Other Ambulatory Visit: Payer: Self-pay

## 2024-10-17 DIAGNOSIS — G629 Polyneuropathy, unspecified: Secondary | ICD-10-CM

## 2024-10-17 DIAGNOSIS — Z87891 Personal history of nicotine dependence: Secondary | ICD-10-CM

## 2024-10-17 DIAGNOSIS — R911 Solitary pulmonary nodule: Secondary | ICD-10-CM

## 2024-10-17 DIAGNOSIS — Z122 Encounter for screening for malignant neoplasm of respiratory organs: Secondary | ICD-10-CM

## 2024-10-17 NOTE — Telephone Encounter (Signed)
 Spoke with patient and reviewed recent Lung CT results. She is in agreement to complete a 3 month follow up scan to evaluate a new 6.4 mm nodule for stability. Order placed and she has been scheduled for 01/07/2025. Results and plan to PCP.

## 2024-10-17 NOTE — Telephone Encounter (Signed)
 See provider note 10/16/2024 for update.

## 2024-10-23 ENCOUNTER — Inpatient Hospital Stay

## 2024-10-25 ENCOUNTER — Encounter: Payer: Self-pay | Admitting: Family Medicine

## 2024-10-28 ENCOUNTER — Ambulatory Visit: Admitting: Family Medicine

## 2024-10-29 ENCOUNTER — Other Ambulatory Visit: Payer: Self-pay | Admitting: Family Medicine

## 2024-10-29 DIAGNOSIS — F339 Major depressive disorder, recurrent, unspecified: Secondary | ICD-10-CM

## 2024-10-29 DIAGNOSIS — E039 Hypothyroidism, unspecified: Secondary | ICD-10-CM

## 2024-10-30 ENCOUNTER — Inpatient Hospital Stay: Attending: Hematology | Admitting: Physician Assistant

## 2024-10-30 ENCOUNTER — Inpatient Hospital Stay: Admitting: Physician Assistant

## 2024-10-30 DIAGNOSIS — D631 Anemia in chronic kidney disease: Secondary | ICD-10-CM

## 2024-10-30 DIAGNOSIS — D500 Iron deficiency anemia secondary to blood loss (chronic): Secondary | ICD-10-CM

## 2024-10-30 DIAGNOSIS — D472 Monoclonal gammopathy: Secondary | ICD-10-CM

## 2024-10-30 DIAGNOSIS — R803 Bence Jones proteinuria: Secondary | ICD-10-CM

## 2024-10-30 DIAGNOSIS — E538 Deficiency of other specified B group vitamins: Secondary | ICD-10-CM

## 2024-11-14 ENCOUNTER — Ambulatory Visit: Admitting: Family Medicine

## 2024-12-04 ENCOUNTER — Inpatient Hospital Stay: Attending: Hematology

## 2024-12-04 ENCOUNTER — Inpatient Hospital Stay

## 2025-01-07 ENCOUNTER — Ambulatory Visit (HOSPITAL_COMMUNITY)

## 2025-01-15 ENCOUNTER — Inpatient Hospital Stay

## 2025-01-22 ENCOUNTER — Inpatient Hospital Stay: Admitting: Physician Assistant

## 2025-01-31 ENCOUNTER — Ambulatory Visit: Admitting: Urology

## 2025-04-11 ENCOUNTER — Ambulatory Visit: Payer: Self-pay
